# Patient Record
Sex: Male | Born: 1940
Health system: Southern US, Community
[De-identification: ages and names within clinical notes are randomized; demographics above are authoritative.]

## PROBLEM LIST (undated history)

## (undated) DIAGNOSIS — I469 Cardiac arrest, cause unspecified: Principal | ICD-10-CM

## (undated) DIAGNOSIS — M1990 Unspecified osteoarthritis, unspecified site: Secondary | ICD-10-CM

## (undated) DIAGNOSIS — Z95810 Presence of automatic (implantable) cardiac defibrillator: Secondary | ICD-10-CM

## (undated) DIAGNOSIS — Z9289 Personal history of other medical treatment: Secondary | ICD-10-CM

## (undated) DIAGNOSIS — I428 Other cardiomyopathies: Secondary | ICD-10-CM

## (undated) DIAGNOSIS — N400 Enlarged prostate without lower urinary tract symptoms: Secondary | ICD-10-CM

## (undated) DIAGNOSIS — E785 Hyperlipidemia, unspecified: Secondary | ICD-10-CM

## (undated) DIAGNOSIS — E039 Hypothyroidism, unspecified: Secondary | ICD-10-CM

## (undated) DIAGNOSIS — E119 Type 2 diabetes mellitus without complications: Secondary | ICD-10-CM

## (undated) DIAGNOSIS — I447 Left bundle-branch block, unspecified: Secondary | ICD-10-CM

## (undated) DIAGNOSIS — I10 Essential (primary) hypertension: Secondary | ICD-10-CM

## (undated) HISTORY — PX: KNEE ARTHROSCOPY: SUR90

## (undated) HISTORY — DX: Essential (primary) hypertension: I10

## (undated) HISTORY — PX: COLONOSCOPY W/ BIOPSIES: SHX1374

## (undated) HISTORY — DX: Left bundle-branch block, unspecified: I44.7

## (undated) HISTORY — DX: Cardiac arrest, cause unspecified: I46.9

## (undated) HISTORY — DX: Type 2 diabetes mellitus without complications: E11.9

## (undated) HISTORY — DX: Personal history of other medical treatment: Z92.89

## (undated) HISTORY — DX: Other cardiomyopathies: I42.8

## (undated) HISTORY — DX: Hyperlipidemia, unspecified: E78.5

## (undated) HISTORY — DX: Morbid (severe) obesity due to excess calories: E66.01

## (undated) HISTORY — DX: Hypothyroidism, unspecified: E03.9

---
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
## 1961-03-27 HISTORY — PX: MOUTH SURGERY: SHX715

## 1998-07-24 ENCOUNTER — Inpatient Hospital Stay (HOSPITAL_COMMUNITY): Admission: AD | Admit: 1998-07-24 | Discharge: 1998-07-25 | Payer: Self-pay | Admitting: Family Medicine

## 2000-02-08 ENCOUNTER — Encounter: Admission: RE | Admit: 2000-02-08 | Discharge: 2000-05-08 | Payer: Self-pay | Admitting: Family Medicine

## 2001-02-15 ENCOUNTER — Ambulatory Visit (HOSPITAL_COMMUNITY): Admission: RE | Admit: 2001-02-15 | Discharge: 2001-02-15 | Payer: Self-pay | Admitting: Orthopedic Surgery

## 2001-02-15 ENCOUNTER — Encounter: Payer: Self-pay | Admitting: Orthopedic Surgery

## 2002-05-22 ENCOUNTER — Encounter: Payer: Self-pay | Admitting: Urology

## 2002-05-22 ENCOUNTER — Ambulatory Visit (HOSPITAL_COMMUNITY): Admission: RE | Admit: 2002-05-22 | Discharge: 2002-05-22 | Payer: Self-pay | Admitting: Urology

## 2002-06-18 ENCOUNTER — Encounter: Payer: Self-pay | Admitting: Urology

## 2002-06-18 ENCOUNTER — Ambulatory Visit (HOSPITAL_COMMUNITY): Admission: RE | Admit: 2002-06-18 | Discharge: 2002-06-18 | Payer: Self-pay | Admitting: Urology

## 2003-06-11 ENCOUNTER — Emergency Department (HOSPITAL_COMMUNITY): Admission: EM | Admit: 2003-06-11 | Discharge: 2003-06-11 | Payer: Self-pay | Admitting: Emergency Medicine

## 2004-08-18 ENCOUNTER — Emergency Department (HOSPITAL_COMMUNITY): Admission: EM | Admit: 2004-08-18 | Discharge: 2004-08-18 | Payer: Self-pay | Admitting: Emergency Medicine

## 2004-11-09 ENCOUNTER — Encounter (INDEPENDENT_AMBULATORY_CARE_PROVIDER_SITE_OTHER): Payer: Self-pay | Admitting: Specialist

## 2004-11-09 ENCOUNTER — Ambulatory Visit (HOSPITAL_COMMUNITY): Admission: RE | Admit: 2004-11-09 | Discharge: 2004-11-09 | Payer: Self-pay | Admitting: Gastroenterology

## 2006-08-15 ENCOUNTER — Ambulatory Visit (HOSPITAL_COMMUNITY): Admission: RE | Admit: 2006-08-15 | Discharge: 2006-08-15 | Payer: Self-pay | Admitting: Family Medicine

## 2007-11-05 ENCOUNTER — Encounter: Admission: RE | Admit: 2007-11-05 | Discharge: 2007-11-05 | Payer: Self-pay | Admitting: Family Medicine

## 2008-09-24 ENCOUNTER — Ambulatory Visit: Payer: Self-pay | Admitting: Cardiovascular Disease

## 2008-09-24 ENCOUNTER — Observation Stay (HOSPITAL_COMMUNITY): Admission: EM | Admit: 2008-09-24 | Discharge: 2008-09-25 | Payer: Self-pay | Admitting: Emergency Medicine

## 2008-10-05 ENCOUNTER — Telehealth (INDEPENDENT_AMBULATORY_CARE_PROVIDER_SITE_OTHER): Payer: Self-pay | Admitting: *Deleted

## 2008-10-06 ENCOUNTER — Encounter: Payer: Self-pay | Admitting: Cardiology

## 2008-10-06 ENCOUNTER — Ambulatory Visit: Payer: Self-pay

## 2008-10-07 ENCOUNTER — Ambulatory Visit: Payer: Self-pay

## 2008-10-12 ENCOUNTER — Encounter: Payer: Self-pay | Admitting: Cardiovascular Disease

## 2010-07-03 LAB — CARDIAC PANEL(CRET KIN+CKTOT+MB+TROPI)
CK, MB: 1.3 ng/mL (ref 0.3–4.0)
Relative Index: 0.9 (ref 0.0–2.5)
Troponin I: 0.01 ng/mL (ref 0.00–0.06)
Troponin I: 0.02 ng/mL (ref 0.00–0.06)

## 2010-07-03 LAB — BASIC METABOLIC PANEL
BUN: 14 mg/dL (ref 6–23)
BUN: 16 mg/dL (ref 6–23)
CO2: 31 mEq/L (ref 19–32)
Calcium: 8.8 mg/dL (ref 8.4–10.5)
Chloride: 103 mEq/L (ref 96–112)
Creatinine, Ser: 1.06 mg/dL (ref 0.4–1.5)
GFR calc non Af Amer: >60 mL/min (ref >60)
Glucose, Bld: 195 mg/dL — ABNORMAL HIGH (ref 70–99)
Potassium: 3.4 mEq/L — ABNORMAL LOW (ref 3.5–5.1)
Potassium: 3.6 mEq/L (ref 3.5–5.1)
Sodium: 141 mEq/L (ref 135–145)

## 2010-07-03 LAB — POCT CARDIAC MARKERS
CKMB, poc: <1 ng/mL — ABNORMAL LOW (ref 1.0–8.0)
Myoglobin, poc: 157 ng/mL (ref 12–200)
Myoglobin, poc: 199 ng/mL (ref 12–200)

## 2010-07-03 LAB — CBC
HCT: 32.6 % — ABNORMAL LOW (ref 39.0–52.0)
Hemoglobin: 11.4 g/dL — ABNORMAL LOW (ref 13.0–17.0)
Hemoglobin: 12.6 g/dL — ABNORMAL LOW (ref 13.0–17.0)
MCHC: 35 g/dL (ref 30.0–36.0)
Platelets: 155 10*3/uL (ref 150–400)
RBC: 4.02 MIL/uL — ABNORMAL LOW (ref 4.22–5.81)
RDW: 14.3 % (ref 11.5–15.5)
RDW: 14.3 % (ref 11.5–15.5)

## 2010-07-03 LAB — POCT I-STAT, CHEM 8
BUN: 22 mg/dL (ref 6–23)
Calcium, Ion: 1.12 mmol/L (ref 1.12–1.32)
HCT: 37 % — ABNORMAL LOW (ref 39.0–52.0)
Hemoglobin: 12.6 g/dL — ABNORMAL LOW (ref 13.0–17.0)
Sodium: 143 mEq/L (ref 135–145)
TCO2: 29 mmol/L (ref 0–100)

## 2010-07-03 LAB — GLUCOSE, CAPILLARY
Glucose-Capillary: 139 mg/dL — ABNORMAL HIGH (ref 70–99)
Glucose-Capillary: 72 mg/dL (ref 70–99)
Glucose-Capillary: 99 mg/dL (ref 70–99)

## 2010-07-03 LAB — DIFFERENTIAL
Basophils Absolute: 0 10*3/uL (ref 0.0–0.1)
Basophils Relative: 1 % (ref 0–1)
Lymphocytes Relative: 30 % (ref 12–46)
Monocytes Absolute: 0.5 10*3/uL (ref 0.1–1.0)
Neutro Abs: 2.8 10*3/uL (ref 1.7–7.7)
Neutrophils Relative %: 56 % (ref 43–77)

## 2010-07-03 LAB — PROTIME-INR: INR: 1 (ref 0.00–1.49)

## 2010-07-03 LAB — BRAIN NATRIURETIC PEPTIDE: Pro B Natriuretic peptide (BNP): <30 pg/mL (ref 0.0–100.0)

## 2010-07-03 LAB — CK TOTAL AND CKMB (NOT AT ARMC)
CK, MB: 1.6 ng/mL (ref 0.3–4.0)
Relative Index: 0.9 (ref 0.0–2.5)

## 2010-07-03 LAB — APTT: aPTT: 27 seconds (ref 24–37)

## 2010-07-03 LAB — TROPONIN I: Troponin I: 0.01 ng/mL (ref 0.00–0.06)

## 2010-08-09 NOTE — H&P (Signed)
NAME:  Rickey Rice, Rickey Rice NO.:  [id number]   MEDICAL RECORD NO.:  [id number]          PATIENT TYPE:  OBV   LOCATION:  1827                         FACILITY:  [facility]   PHYSICIAN:  Verne Carrow, MDDATE OF BIRTH:  08/29/1940   DATE OF ADMISSION:  09/24/2008  DATE OF DISCHARGE:                              HISTORY & PHYSICAL   PRIMARY CARE PHYSICIAN:  Renaye Rakers, M.D.   HISTORY OF PRESENT ILLNESS:  The patient is a 70 year old obese African  American male with a history of hypertension and diabetes who presents  to the emergency department after experiencing chest pain at about 10:30  a.m. this morning.  The patient describes the pain as sharp, located in  the left chest and associated with left hand numbness.  The patient  reports the episode lasted about one minute and resolved spontaneously.  The patient was sitting at the computer at the time of this occurrence.  The patient had a recurrence of this pain briefly on the way to the  emergency department, both episodes lasted less than a minute.  They  were not associated with any nausea or diaphoresis.  Both episodes  resolved spontaneously.  At that onset of the first episode, the patient  took three baby aspirin at home.  He has been placed on NitroPaste and  glycerin patch urine emergency department, although he has experienced  no chest pain.  The patient reports that he had a negative stress test  about 15 years ago performed by Dr. Elsie Lincoln.   ALLERGIES:  BEE STINGS AND ACTOS.   MEDICINES.:  1. Amlodipine 5 mg daily.  2. Synthroid 75 mcg daily.  3. Metformin 500 mg daily.  4. Crestor 10 mg p.o. daily.  5. Tekturna HCT 300/25 p.o. daily.  6. Glimepiride 8 mg p.o. daily.  7. Onglyza 5 mg p.o. daily.  8. Celebrex p.r.n.   PAST MEDICAL HISTORY:  1. Hypertension.  2. Diabetes on oral medications last A1c per the patient was 8.43      months ago.  3. Hyperlipidemia.  4. Hypothyroidism.    SURGERIES:  1. Broken jaw 1973.  2. Knee arthroscopy in 2008.  3. Negative stress test 15 years ago.   FAMILY HISTORY:  1. A nephew who needs a heart transplant.  2. Sister with a stroke at age 32.  3. Diabetes in mother and father.  4. Cancer of the pancreas in mother.  5. Cancer of the breast in sister.  6. Cancer of the lung and throat in uncles who were smokers.  7. End-stage renal disease in a brother of unknown etiology diagnosed      in his 30s, still living.   SOCIAL HISTORY:  The patient lives with his wife Chinita Greenland, her  number is [phone].  The patient has never smoked.  He denies any drug  use.  Denies any alcohol use.  The patient is retired from Programme researcher, broadcasting/film/video.  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   REVIEW OF SYSTEMS:  Negative for any headache, vision changes, cough,  orthopnea, PND, dyspnea on exertion, nausea, vomiting, diarrhea,  melena,  hematuria.  The patient does endorse chronic bilateral lower extremity  pitting edema.   PHYSICAL EXAMINATION:  VITALS:  Temperature 98.3, blood pressure 145/85,  heart rate 69, respiratory rate 21, O2 saturation 98% on room air.  GENERAL:  The patient is in no acute distress.  He is obese.  He is  pleasant.  HEENT:  Extraocular muscles are intact.  Sclerae clear.  Moist mucous  membranes.  NECK:  No carotid bruits bilaterally.  No JVD.  CARDIOVASCULAR:  Regular rate and rhythm.  Normal precordium.  No  murmurs are auscultated.  LUNGS:  Work of breathing is unlabored.  Clear to auscultation  bilaterally.  No wheezes, rales or rhonchi.  ABDOMEN:  Positive bowel sounds, soft, nontender, nondistended.  EXTREMITIES:  Dorsalis pedis and radial pulses 2+.  There is moderate  bilateral lower extremity pitting edema.  There is no calf tenderness.   WORKUP SO FAR:  1. Chest x-ray shows elevated right hemidiaphragm that is stable from      previous.  2. Point of care cardiac enzymes are negative.  3. EKG shows  normal sinus rhythm.  No ST or T-wave abnormalities.      Intervals are normal.  4. White blood cell count 4.9, hemoglobin 12.6, platelets 194.  I-stat      chemistry shows sodium 143, potassium 4.2, chloride 104, CO2 29,      BUN 22, creatinine 1.2, glucose 83, PTT is 27.  PT 13.2, INR 1.0.   ASSESSMENT/PLAN:  This 70 year old obese gentleman with diabetes,  hypertension who comes to the emergency department after experiencing  brief atypical chest pain.  1. Chest pain.  Currently resolved.  EKG is unremarkable.  Point care      cardiac enzymes are negative times one set.  Given the patient's      history of diabetes and hypertension, plan to rule out with cardiac      enzymes.  The patient can also use further risk stratification with      stress test or other functional study.  We will continue the      patient on aspirin, start low-dose beta blocker for cardiac      protection, check fasting lipid panel.  Continue statin in the      hospital.  Will recheck EKG in the hospital and cycle cardiac      enzymes q.8 h x3.  2. Hypertension.  Blood pressure is slightly elevated in the emergency      department, heart rate 69.  We will continue his medications from      home and add beta-blocker.  3. Diabetes.  Continue home medications sliding scale insulin for      coverage of hyperglycemia.  The patient reports his last A1c was      approximately 8.4 about 3 months ago.  4. FENGI.  Carbohydrate modified diet, normal saline 75 mL an hour.      Check BNP level, although no edema is shown on chest x-ray.  5. Prophylaxis.  Lovenox for DVT prophylaxis.   DISPOSITION:  Cycle cardiac enzymes.  Consider functional study for  further risk stratification.  Assuming workup is negative, anticipate  early discharge for this patient.      Myrtie Soman, MD  Electronically Signed      Verne Carrow, MD  Electronically Signed    TE/MEDQ  D:  09/24/2008  T:  09/24/2008  Job:   [id number]

## 2010-08-09 NOTE — Discharge Summary (Signed)
NAME:  DEVLYN, RETTER NO.:  [id number]   MEDICAL RECORD NO.:  [id number]          PATIENT TYPE:  OBV   LOCATION:  3735                         FACILITY:  [facility]   PHYSICIAN:  Rollene Rotunda, MD, FACCDATE OF BIRTH:  [birth year]   DATE OF ADMISSION:  09/24/2008  DATE OF DISCHARGE:  09/25/2008                               DISCHARGE SUMMARY   PRIMARY CARDIOLOGIST:  Verne Carrow, MD   PRIMARY CARE Denea Cheaney:  Renaye Rakers, MD   DISCHARGE DIAGNOSIS:  Chest pain.   SECONDARY DIAGNOSES:  1. Hypertension.  2. Hyperlipidemia.  3. Type 2 diabetes mellitus.  4. Obesity.  5. Hypothyroidism.   ALLERGIES:  BEE STINGS and ACTOS.   PROCEDURES:  None.   HISTORY OF PRESENT ILLNESS:  A 70 year old [place] American male with  the above problems.  He was in his usual state of health until a morning  of July 1.  When he had a brief less than 1 minute episode of sharp left  sided chest discomfort associated with left hand numbness.  Symptoms  resolved spontaneously.  He had one recurrent episode again lasting less  than 1 minute, while he was driving to the ED.  Once in the ER, ECG  showed no acute changes and enzymes were negative.  He was admitted for  observation and rule out hospital course.  The patient ruled out for MI.  He had no recurrent chest discomfort.  Symptoms were felt to be atypical  and we will discharge him home today in good condition.  He is currently  arranged for an outpatient 2-day exercise Myoview on July 13.  Our  office is going to work to try and move that appointment up to some time  early next week.   DISCHARGE LABORATORY DATA:  Hemoglobin 11.4, hematocrit 32.6, WBC 5.4,  and platelets 155.  Sodium 141, potassium 3.1, chloride 105, CO2 30, BUN  14, creatinine 1.11, glucose 195, and calcium 8.8.  Cardiac markers  negative x3.  BNP less than 30.   DISPOSITION:  The patient will be discharged home today in good  condition.   FOLLOWUP PLANS  AND APPOINTMENTS:  He has an exercise Myoview scheduled  in our office for July 13 at 7:30 a.m.  This is tentative date as we  hope to move this appointment up.  He is asked to follow up with Dr.  Parke Simmers in the next 1-2 weeks.   DISCHARGE MEDICATIONS:  1. Aspirin 81 mg daily.  2. Amlodipine 5 mg daily.  3. Synthroid 75 mcg daily.  4. Metformin 500 mg daily.  5. Crestor 10 mg daily.  6. Tekturna HCT 300/25 mg daily.  7. Glimepiride 8 mg daily.  8. Onglyza 5 mg daily.  9. Celebrex 100 mg daily p.r.n.  10.Nitroglycerin 0.4 mg sublingual p.r.n. chest pain.   OUTSTANDING LABORATORY STUDIES:  None.  Exercise Myoview as outlined  above.   DURATION OF DISCHARGE ENCOUNTER:  50 minutes including physician time.      Nicolasa Ducking, ANP      Rollene Rotunda, MD, [facility]  Electronically Signed    CB/MEDQ  D:  09/25/2008  T:  09/25/2008  Job:  [id number]   cc:   Renaye Rakers, M.D.

## 2010-08-12 NOTE — Op Note (Signed)
NAME:  Rickey Rice, Rickey Rice              ACCOUNT NO.:  [id number]   MEDICAL RECORD NO.:  [id number]          PATIENT TYPE:  AMB   LOCATION:  ENDO                         FACILITY:  [facility]   PHYSICIAN:  Anselmo Rod, M.D.  DATE OF BIRTH:  11/11/1940   DATE OF PROCEDURE:  11/09/2004  DATE OF DISCHARGE:                                 OPERATIVE REPORT   PROCEDURE PERFORMED:  Colonoscopy with snare polypectomy x1.   ENDOSCOPIST:  Anselmo Rod, M.D.   INSTRUMENT USED:  Olympus video colonoscope.   INDICATIONS FOR PROCEDURE:  A 70 year old [place] American male with history  of multiple medical problems, GERD, hypertension, arthritis, diabetes and  hypothyroidism, morbid obesity undergoing screening colonoscopy to rule out  colonic polyps, masses, etc.   PREPROCEDURE PREPARATION:  Informed consent was procured from the patient.  The patient fasted for 8 hours prior to the procedure and prepped with a  bottle of magnesium citrate and a gallon of GoLYTELY the night prior to the  procedure.  Risks and benefits of the procedure including a 10% miss rate of  cancer and polyps was discussed with the patient as well.   PREPROCEDURE PHYSICAL:  VITAL SIGNS:  Stable vital signs.  NECK:  Supple.  CHEST:  Clear to auscultation.  CARDIOVASCULAR:  S1 and S2 regular.  ABDOMEN:  Soft with normal bowel sounds.   DESCRIPTION OF PROCEDURE:  The patient was placed in left lateral decubitus  position, sedated with 60 mg of Demerol and 6 mg of Versed in slow  incremental doses.  Once the patient was adequately sedated and maintained  on low flow oxygen and continuous cardiac monitoring, the Olympus video  colonoscope was advanced from the rectum to the cecum with extreme  difficulty because of the patient's body habitus and long tortuous colon.  The patient's position was changed from the left lateral to the supine and  then the right lateral and ultimately the prone position.  Gentle abdominal  pressure was applied several times to reach the cecal base.  There was large  amount of residual stool in the right colon.  Multiple washings were done.  Small lesions could be missed.  A small sessile polyp was snared from 100  cm.  There was evidence of extensive sigmoid diverticulosis.  Small internal  hemorrhoids were seen on retroflexion.  The patient tolerated the procedure  well without complications.   IMPRESSION:  1.  Small nonbleeding internal hemorrhoids.  2.  Extensive sigmoid diverticulosis.  3.  Small sessile polyp snared from 100 cm.  4.  Large amount of residual stool in the right colon.  Multiple washings      done.   RECOMMENDATIONS:  1.  Await pathology results.  2.  Avoid nonsteroidals including aspirin for the next 10 days.  3.  Repeat colonoscopy depending on pathology results.  4.  Brochures on diverticulosis and a high fiber diet have been given to the      patient for his education.  5.  Outpatient follow-up as need arises in the future.      Anselmo Rod, M.D.  Electronically Signed     JNM/MEDQ  D:  11/09/2004  T:  11/09/2004  Job:  [id number]   cc:   Renaye Rakers, M.D.  [phone] [address], [address]  [place]  [place] [address]  Fax: [phone]

## 2012-04-22 ENCOUNTER — Other Ambulatory Visit (HOSPITAL_COMMUNITY): Payer: Self-pay | Admitting: Internal Medicine

## 2012-04-22 DIAGNOSIS — I10 Essential (primary) hypertension: Secondary | ICD-10-CM

## 2012-04-22 DIAGNOSIS — R0600 Dyspnea, unspecified: Secondary | ICD-10-CM

## 2012-04-22 DIAGNOSIS — E119 Type 2 diabetes mellitus without complications: Secondary | ICD-10-CM

## 2012-04-22 DIAGNOSIS — I447 Left bundle-branch block, unspecified: Secondary | ICD-10-CM

## 2012-04-27 DIAGNOSIS — Z9289 Personal history of other medical treatment: Secondary | ICD-10-CM

## 2012-04-27 HISTORY — DX: Personal history of other medical treatment: Z92.89

## 2012-05-01 ENCOUNTER — Ambulatory Visit (HOSPITAL_COMMUNITY)
Admission: RE | Admit: 2012-05-01 | Discharge: 2012-05-01 | Disposition: A | Discharge status: Home / Self Care | Payer: Medicare Other | Source: Ambulatory Visit | Attending: Internal Medicine | Admitting: Internal Medicine

## 2012-05-01 DIAGNOSIS — R0600 Dyspnea, unspecified: Secondary | ICD-10-CM

## 2012-05-01 DIAGNOSIS — R0989 Other specified symptoms and signs involving the circulatory and respiratory systems: Secondary | ICD-10-CM | POA: Insufficient documentation

## 2012-05-01 DIAGNOSIS — E119 Type 2 diabetes mellitus without complications: Secondary | ICD-10-CM

## 2012-05-01 DIAGNOSIS — R0609 Other forms of dyspnea: Secondary | ICD-10-CM | POA: Insufficient documentation

## 2012-05-01 DIAGNOSIS — I447 Left bundle-branch block, unspecified: Secondary | ICD-10-CM | POA: Insufficient documentation

## 2012-05-01 DIAGNOSIS — I10 Essential (primary) hypertension: Secondary | ICD-10-CM | POA: Insufficient documentation

## 2012-05-01 MED ORDER — TECHNETIUM TC 99M SESTAMIBI GENERIC - CARDIOLITE
30.0000 | Freq: Once | INTRAVENOUS | Status: AC | PRN
  Administered 2012-05-01: 30 via INTRAVENOUS

## 2012-05-01 MED ORDER — REGADENOSON 0.4 MG/5ML IV SOLN
0.4000 mg | Freq: Once | INTRAVENOUS | Status: AC
  Administered 2012-05-01: 0.4 mg via INTRAVENOUS

## 2012-05-01 NOTE — Procedures (Signed)
[facility] [facility] [address] [place] [address] [place] [address] [phone]  Cardiology Nuclear Med Study  Rickey Rice is a 72 y.o. male     MRN : [id number]     DOB: 07-Mar-1941  Procedure Date: 05/01/2012  Nuclear Med Background Indication for Stress Test:  Evaluation for Ischemia History:  no prior cardiac history Cardiac Risk Factors: Hypertension, LBBB, Lipids, NIDDM and Obesity  Symptoms:  DOE   Nuclear Pre-Procedure Caffeine/Decaff Intake:  7:00pm NPO After: 5:00am   IV Site: R Antecubital  IV 0.9% NS with Angio Cath:  22g  Chest Size (in):  2X IV Started by: Koren Shiver, CNMT  Height: 6' (1.829 m)  Cup Size: n/a  BMI:  Body mass index is 44.48 kg/(m^2). Weight:  328 lb (148.78 kg)   Tech Comments:  n/a    Nuclear Med Study 1 or 2 day study: 2 day  Stress Test Type:  Lexiscan  Order Authorizing Provider:  Zoila Shutter, MD   Resting Radionuclide: Technetium 47m Sestamibi  Resting Radionuclide Dose: 30.3 mCi   Stress Radionuclide:  Technetium 35m Sestamibi  Stress Radionuclide Dose: 30.9 mCi           Stress Protocol Rest HR: 72 Stress HR: 88  Rest BP: 142/98 Stress BP: 134/80  Exercise Time (min): n/a METS: n/a   Predicted Max HR: 149 bpm % Max HR: 59.06 bpm Rate Pressure Product: 46962   Dose of Adenosine (mg):  n/a Dose of Lexiscan: 0.4 mg  Dose of Atropine (mg): n/a Dose of Dobutamine: n/a mcg/kg/min (at max HR)  Stress Test Technologist: Esperanza Sheets, CCT Nuclear Technologist: Koren Shiver, CNMT   Rest Procedure:  Myocardial perfusion imaging was performed at rest 45 minutes following the intravenous administration of Technetium 78m Sestamibi. Stress Procedure:  The patient received IV Lexiscan 0.4 mg over 15-seconds.  Technetium 37m Sestamibi injected at 30-seconds.  There were no significant changes with Lexiscan.  Quantitative spect images were obtained after a 45 minute delay.  Transient Ischemic  Dilatation (Normal <1.22):  1.1 Lung/Heart Ratio (Normal <0.45):  0.29 QGS EDV:  128 ml QGS ESV:  65 ml LV Ejection Fraction: 49%  Signed by

## 2012-05-02 ENCOUNTER — Ambulatory Visit (HOSPITAL_COMMUNITY)
Admission: RE | Admit: 2012-05-02 | Discharge: 2012-05-02 | Disposition: A | Discharge status: Home / Self Care | Payer: Medicare Other | Source: Ambulatory Visit | Attending: Internal Medicine | Admitting: Internal Medicine

## 2012-05-02 DIAGNOSIS — E119 Type 2 diabetes mellitus without complications: Secondary | ICD-10-CM | POA: Insufficient documentation

## 2012-05-02 DIAGNOSIS — R0609 Other forms of dyspnea: Secondary | ICD-10-CM | POA: Insufficient documentation

## 2012-05-02 DIAGNOSIS — I10 Essential (primary) hypertension: Secondary | ICD-10-CM | POA: Insufficient documentation

## 2012-05-02 DIAGNOSIS — I447 Left bundle-branch block, unspecified: Secondary | ICD-10-CM | POA: Insufficient documentation

## 2012-05-02 DIAGNOSIS — R0989 Other specified symptoms and signs involving the circulatory and respiratory systems: Secondary | ICD-10-CM | POA: Insufficient documentation

## 2012-05-02 DIAGNOSIS — R0600 Dyspnea, unspecified: Secondary | ICD-10-CM

## 2012-05-02 MED ORDER — TECHNETIUM TC 99M SESTAMIBI GENERIC - CARDIOLITE
30.0000 | Freq: Once | INTRAVENOUS | Status: AC | PRN
  Administered 2012-05-02: 30 via INTRAVENOUS

## 2012-05-02 NOTE — Procedures (Addendum)
[facility] [address]  [address] [place] [address]  [place] [address]  [phone]  Cardiology Nuclear Med Study  Rickey Rice is a 72 y.o. male MRN : [id number] DOB: 04-May-1940  Procedure Date: 05/01/2012  Nuclear Med Background  Indication for Stress Test: Evaluation for Ischemia  History: no prior cardiac history  Cardiac Risk Factors: Hypertension, LBBB, Lipids, NIDDM and Obesity  Symptoms: DOE  Nuclear Pre-Procedure  Caffeine/Decaff Intake: 7:00pm  NPO After: 5:00am   IV Site: R Antecubital  IV 0.9% NS with Angio Cath: 22g   Chest Size (in): 2X  IV Started by: Koren Shiver, CNMT   Height: 6' (1.829 m)  Cup Size: n/a   BMI: Body mass index is 44.48 kg/(m^2).  Weight: 328 lb (148.78 kg)    Tech Comments: n/a   Nuclear Med Study  1 or 2 day study: 2 day  Stress Test Type: Lexiscan   Order Authorizing Provider: Zoila Shutter, MD    Resting Radionuclide: Technetium 3m Sestamibi  Resting Radionuclide Dose: 30.3 mCi   Stress Radionuclide: Technetium 46m Sestamibi  Stress Radionuclide Dose: 30.9 mCi   Stress Protocol  Rest HR: 72  Stress HR: 88   Rest BP: 142/98  Stress BP: 134/80   Exercise Time (min): n/a  METS: n/a   Predicted Max HR: 149 bpm  % Max HR: 59.06 bpm  Rate Pressure Product: 29528  Dose of Adenosine (mg): n/a  Dose of Lexiscan: 0.4 mg   Dose of Atropine (mg): n/a  Dose of Dobutamine: n/a mcg/kg/min (at max HR)   Stress Test Technologist: Esperanza Sheets, CCT  Nuclear Technologist: Koren Shiver, CNMT   Rest Procedure: Myocardial perfusion imaging was performed at rest 45 minutes following the intravenous administration of Technetium 20m Sestamibi.  Stress Procedure: The patient received IV Lexiscan 0.4 mg over 15-seconds. Technetium 81m Sestamibi injected at 30-seconds. There were no significant changes with Lexiscan. Quantitative spect images were obtained after a 45 minute delay.  Transient Ischemic Dilatation (Normal  <1.22): 1.1  Lung/Heart Ratio (Normal <0.45): 0.29  QGS EDV: 128 ml  QGS ESV: 65 ml  LV Ejection Fraction: 49%   Rest ECG: NSR-LBBB Stress ECG: no change  QPS Raw Data Images:  Normal; no motion artifact; normal heart/lung ratio. Stress Images:  There is decreased uptake in the inferior wall and the apex. Rest Images:  There is decreased uptake in the inferior wall and the apex. Subtraction (SDS):   LV Wall Motion:  Mildly depressed LV function. Mild inferior and apical hypokinesis.  Impression Exercise Capacity: Lexiscan study, no exercise BP Response:  Normal blood pressure response. Clinical Symptoms:  none ECG Impression: Uninterpretable due to LBBB Comparison with Prior Nuclear Study: No previous study is available for comparison. Overall Impression:  Low risk stress nuclear study.There is evidence of inferior and apical scar, but no ischemia.     Thurmon Fair, MD  05/02/2012 1:27 PM

## 2013-01-06 ENCOUNTER — Encounter: Payer: Self-pay | Admitting: *Deleted

## 2013-01-07 ENCOUNTER — Ambulatory Visit (INDEPENDENT_AMBULATORY_CARE_PROVIDER_SITE_OTHER): Payer: Medicare Other | Admitting: Internal Medicine

## 2013-01-07 ENCOUNTER — Encounter: Payer: Self-pay | Admitting: Internal Medicine

## 2013-01-07 VITALS — BP 128/82 | HR 73 | Ht 72.0 in | Wt 333.3 lb

## 2013-01-07 DIAGNOSIS — E119 Type 2 diabetes mellitus without complications: Secondary | ICD-10-CM | POA: Insufficient documentation

## 2013-01-07 DIAGNOSIS — E039 Hypothyroidism, unspecified: Secondary | ICD-10-CM | POA: Insufficient documentation

## 2013-01-07 DIAGNOSIS — I447 Left bundle-branch block, unspecified: Secondary | ICD-10-CM | POA: Insufficient documentation

## 2013-01-07 DIAGNOSIS — E785 Hyperlipidemia, unspecified: Secondary | ICD-10-CM | POA: Insufficient documentation

## 2013-01-07 DIAGNOSIS — Z01810 Encounter for preprocedural cardiovascular examination: Secondary | ICD-10-CM | POA: Insufficient documentation

## 2013-01-07 DIAGNOSIS — I10 Essential (primary) hypertension: Secondary | ICD-10-CM | POA: Insufficient documentation

## 2013-01-07 NOTE — Progress Notes (Signed)
OFFICE NOTE  Chief Complaint:  Pre-operative evaluation  Primary Care Physician: Geraldo Pitter, MD  HPI:  Rickey Rice  is a 72 year old gentleman with a history of diabetes, hypertension, hypothyroidism, and morbid obesity. In 2010 he had complaints of chest pain and underwent stress testing with [facility] which was a 2-day nuclear stress test and was apparently negative. Recently he underwent a colonoscopy and a preoperative EKG was abnormal. I unfortunately do not have that EKG to review, but I did review the EKG from your office on April 09, 2012 which showed a borderline intraventricular conduction delay, a sinus rhythm at 66, and poor R-wave progression. Today in the office he has an abnormal EKG demonstrating a left bundle branch block with a QRS duration of 168 msec, heart rate of 78 in sinus. His only symptoms are shortness of breath with exertion. He underwent evaluation of his left bundle branch block in February 2014. This is a 2 day nuclear stress test which was negative for ischemia. There was a small inferior and apical defect which could represent scar versus artifact. EF was mildly reduced at 49%.  He is now contemplating knee replacement surgery. He continues to be asymptomatic denies any chest pain or worsening shortness of breath with exertion.  PMHx:  Past Medical History  Diagnosis Date  . Diabetes mellitus without complication     type 2  . Hypertension   . Hypothyroidism   . Morbid obesity   . Dyslipidemia   . LBBB (left bundle branch block)   . History of nuclear stress test 04/2012    lexiscan - 2 day protocol; low risk study, evidence of inferrior & apical scar but no ischemia     Past Surgical History  Procedure Laterality Date  . Mouth surgery  1963  . Finger surgery  1954    FAMHx:  Family History  Problem Relation Age of Onset  . Cancer Mother   . Kidney disease Brother   . Hypertension Sister     SOCHx:   reports that he has  never smoked. He has never used smokeless tobacco. He reports that he does not drink alcohol or use illicit drugs.  ALLERGIES:  Allergies  Allergen Reactions  . Norvasc [Amlodipine Besylate]   . Pioglitazone     REACTION: Reaction not known    ROS: A comprehensive review of systems was negative except for: Constitutional: positive for weight gain Respiratory: positive for dyspnea on exertion Musculoskeletal: positive for stiff joints and knee pain  HOME MEDS: Current Outpatient Prescriptions  Medication Sig Dispense Refill  . [place]-Hydrochlorothiazide (TEKTURNA HCT) 300-25 MG TABS Take 1 tablet by mouth daily.      Marland Kitchen amLODipine (NORVASC) 5 MG tablet Take 5 mg by mouth daily.      . celecoxib (CELEBREX) 200 MG capsule Take 200 mg by mouth daily.      Marland Kitchen glimepiride (AMARYL) 4 MG tablet Take 4 mg by mouth daily before breakfast.      . HYDROcodone-acetaminophen (NORCO/VICODIN) 5-325 MG per tablet as needed.      Marland Kitchen levothyroxine (SYNTHROID, LEVOTHROID) 75 MCG tablet Take 75 mcg by mouth daily before breakfast.      . metFORMIN (GLUCOPHAGE) 1000 MG tablet Take 1,000 mg by mouth daily with breakfast.      . rosuvastatin (CRESTOR) 10 MG tablet Take 10 mg by mouth daily.      . saxagliptin HCl (ONGLYZA) 5 MG TABS tablet Take 5 mg by mouth daily.  No current facility-administered medications for this visit.    LABS/IMAGING: No results found for this or any previous visit (from the past 48 hour(s)). No results found.  VITALS: BP 128/82  Pulse 73  Ht 6' (1.829 m)  Wt 333 lb 4.8 oz (151.184 kg)  BMI 45.19 kg/m2  EXAM: General appearance: alert, no distress and morbidly obese Lungs: clear to auscultation bilaterally Heart: regular rate and rhythm, S1, S2 normal, no murmur, click, rub or gallop Extremities: extremities normal, atraumatic, no cyanosis or edema Pulses: 2+ and symmetric Skin: Skin color, texture, turgor normal. No rashes or lesions  EKG: Normal sinus  rhythm, left bundle branch block pattern  ASSESSMENT: 1. Low to intermediate risk for upcoming surgery 2. Negative nuclear stress test for ischemia, fixed inferoapical defect suggesting possible scar versus artifact, EF 49% 3.   Diabetes type 2 on oral medications 4.   Hypertension-well controlled 5.   Dyslipidemia statin  PLAN: 1.   Mr. Musgrave had a low risk stress test in February 2014. Although he has significant risks for coronary disease, we have not been able to demonstrate that conclusively. There was a small inferoapical defect which I suggested possibly artifact. EF was 49% however given his left bundle branch block, gaiting abnormalities are common. Either way I believe this is a low risk study and would classify him as low to intermediate risk for surgery. He'll the medication he's not currently taking it maybe beneficial is a beta blocker. However he does have a significant left bundle branch block and he may not tolerate beta blockade.  If desired, consider using low-dose short-acting metoprolol tartrate and titrate heart rate down around 60 around the time of surgery, starting several days preoperatively.  I'll plan to see him in followup in about 6 months but would be happy to be available as needed around the time of surgery if there are any cardiac complications.  Thanks for continuing to involve me in his care.  Chrystie Nose, MD, [facility] Attending Cardiologist [facility]  Melana Hingle C 01/07/2013, 2:12 PM

## 2013-01-07 NOTE — Patient Instructions (Signed)
Follow up in 6 months 

## 2013-01-08 ENCOUNTER — Encounter: Payer: Self-pay | Admitting: Internal Medicine

## 2013-01-15 ENCOUNTER — Other Ambulatory Visit: Payer: Self-pay | Admitting: Orthopedic Surgery

## 2013-01-28 ENCOUNTER — Encounter (HOSPITAL_COMMUNITY): Payer: Self-pay | Admitting: Pharmacy Technician

## 2013-01-28 NOTE — Pre-Procedure Instructions (Signed)
Rickey Rice  01/28/2013   Your procedure is scheduled on:  Wednesday, November 12th   Report to [facility] Short Stay Main Entrance "A" at 10:45 AM.   Call this number if you have problems the morning of surgery: [phone]   Remember:   Do not eat food or drink liquids after midnight Tuesday.   Take these medicines the morning of surgery with A SIP OF WATER: Amlodipine, Hydrocodone, Levothyroxine   Do not wear jewelry-no rings or watches.  Do not wear lotions, powders, or colognes. You may wear deodorant.  Men may shave face and neck.   Do not bring valuables to the hospital.  [facility] is not responsible for any belongings or valuables.               Contacts, dentures or bridgework may not be worn into surgery.  Leave suitcase in the car. After surgery it may be brought to your room.   For patients admitted to the hospital, discharge time is determined by your treatment team.               Name and phone number of your driver:    Special Instructions: Shower using CHG 2 nights before surgery and the night before surgery.  If you shower the day of surgery use CHG.  Use special wash - you have one bottle of CHG for all showers.  You should use approximately 1/3 of the bottle for each shower.   Please read over the following fact sheets that you were given: Pain Booklet, Coughing and Deep Breathing, Blood Transfusion Information, MRSA Information and Surgical Site Infection Prevention

## 2013-01-29 ENCOUNTER — Encounter (HOSPITAL_COMMUNITY)
Admission: RE | Admit: 2013-01-29 | Discharge: 2013-01-29 | Disposition: A | Discharge status: Home / Self Care | Payer: Medicare Other | Source: Ambulatory Visit | Attending: Orthopedic Surgery | Admitting: Orthopedic Surgery

## 2013-01-29 ENCOUNTER — Encounter (HOSPITAL_COMMUNITY): Payer: Self-pay

## 2013-01-29 DIAGNOSIS — Z01812 Encounter for preprocedural laboratory examination: Secondary | ICD-10-CM | POA: Insufficient documentation

## 2013-01-29 DIAGNOSIS — Z01818 Encounter for other preprocedural examination: Secondary | ICD-10-CM | POA: Insufficient documentation

## 2013-01-29 HISTORY — DX: Unspecified osteoarthritis, unspecified site: M19.90

## 2013-01-29 LAB — URINE MICROSCOPIC-ADD ON

## 2013-01-29 LAB — URINALYSIS, ROUTINE W REFLEX MICROSCOPIC
Bilirubin Urine: NEGATIVE
Glucose, UA: NEGATIVE mg/dL
Hgb urine dipstick: NEGATIVE
Ketones, ur: NEGATIVE mg/dL
Protein, ur: 30 mg/dL — AB
Specific Gravity, Urine: 1.02 (ref 1.005–1.030)

## 2013-01-29 LAB — BASIC METABOLIC PANEL
BUN: 17 mg/dL (ref 6–23)
Chloride: 105 mEq/L (ref 96–112)
GFR calc Af Amer: 75 mL/min — ABNORMAL LOW (ref >90)
GFR calc non Af Amer: 65 mL/min — ABNORMAL LOW (ref >90)
Potassium: 4 mEq/L (ref 3.5–5.1)
Sodium: 143 mEq/L (ref 135–145)

## 2013-01-29 LAB — CBC WITH DIFFERENTIAL/PLATELET
Basophils Absolute: 0 10*3/uL (ref 0.0–0.1)
Basophils Relative: 0 % (ref 0–1)
Hemoglobin: 12.8 g/dL — ABNORMAL LOW (ref 13.0–17.0)
MCH: 31.7 pg (ref 26.0–34.0)
MCHC: 33.9 g/dL (ref 30.0–36.0)
Monocytes Absolute: 0.6 10*3/uL (ref 0.1–1.0)
Monocytes Relative: 11 % (ref 3–12)
Neutro Abs: 2.7 10*3/uL (ref 1.7–7.7)
Neutrophils Relative %: 50 % (ref 43–77)
Platelets: 186 10*3/uL (ref 150–400)
WBC: 5.4 10*3/uL (ref 4.0–10.5)

## 2013-01-29 LAB — APTT: aPTT: 30 seconds (ref 24–37)

## 2013-01-29 LAB — PROTIME-INR
INR: 0.93 (ref 0.00–1.49)
Prothrombin Time: 12.3 seconds (ref 11.6–15.2)

## 2013-01-29 LAB — ABO/RH: ABO/RH(D): O POS

## 2013-01-29 LAB — TYPE AND SCREEN: ABO/RH(D): O POS

## 2013-01-30 NOTE — Progress Notes (Signed)
Anesthesia Chart Review:  Patient is a 72 year old male scheduled for right TKA on 02/05/13 by Dr. Turner Daniels.  History includes morbid obesity, DM2, HTN, hypothyroidism, left BBB, dyslipidemia, arthritis, non-smoker.  Cardiologist is Dr. Rennis Golden who cleared him with low to intermediate risk.  EKG on 01/07/13 showed NSR, left BBB, possible lateral infarct.  Nuclear stress test on 05/02/12 showed: Low risk stress nuclear study.There is evidence of inferior and apical scar but no ischemia. EF 49%.  CXR on 01/29/13 showed low lung volumes, no acute cardiopulmonary process.  Preoperative labs noted.  Anticipate that he can proceed as planned.  Velna Ochs [facility] Short Stay Center/Anesthesiology Phone [phone] 01/30/2013 4:31 PM

## 2013-02-04 MED ORDER — DEXTROSE 5 % IV SOLN
3.0000 g | INTRAVENOUS | Status: AC
  Administered 2013-02-05: 3 g via INTRAVENOUS
  Filled 2013-02-04: qty 3000

## 2013-02-04 NOTE — H&P (Signed)
TOTAL KNEE ADMISSION H&P  Patient is being admitted for right total knee arthroplasty.  Subjective:  Chief Complaint:right knee pain.  HPI: Rickey Rice, 72 y.o. male, has a history of pain and functional disability in the right knee due to arthritis and has failed non-surgical conservative treatments for greater than 12 weeks to includeNSAID's and/or analgesics, corticosteriod injections, viscosupplementation injections and activity modification.  Onset of symptoms was gradual, starting many years ago with gradually worsening course since that time. The patient noted no past surgery on the right knee(s).  Patient currently rates pain in the right knee(s) at 10 out of 10 with activity. Patient has worsening of pain with activity and weight bearing, pain that interferes with activities of daily living and pain with passive range of motion.  Patient has evidence of joint space narrowing by imaging studies. There is no active infection.  Patient Active Problem List   Diagnosis Date Noted  . LBBB (left bundle branch block) 01/07/2013  . Preoperative cardiovascular examination 01/07/2013  . DM2 (diabetes mellitus, type 2) 01/07/2013  . Hypothyroidism 01/07/2013  . Morbid obesity 01/07/2013  . HTN (hypertension) 01/07/2013  . Dyslipidemia 01/07/2013   Past Medical History  Diagnosis Date  . Diabetes mellitus without complication     type 2  . Hypertension   . Hypothyroidism   . Morbid obesity   . Dyslipidemia   . LBBB (left bundle branch block)   . History of nuclear stress test 04/2012    lexiscan - 2 day protocol; low risk study, evidence of inferrior & apical scar but no ischemia   . Arthritis     Past Surgical History  Procedure Laterality Date  . Mouth surgery  1963  . Finger surgery  1954  . Colonoscopy w/ biopsies    . Knee arthroscopy Left     No prescriptions prior to admission   Allergies  Allergen Reactions  . Pioglitazone     Lips swelling    History   Substance Use Topics  . Smoking status: Never Smoker   . Smokeless tobacco: Never Used  . Alcohol Use: No    Family History  Problem Relation Age of Onset  . Cancer Mother   . Kidney disease Brother   . Hypertension Sister      Review of Systems  Constitutional: Negative.   HENT: Negative.   Eyes: Negative.   Respiratory: Negative.   Cardiovascular: Negative.   Gastrointestinal: Negative.   Genitourinary: Negative.   Musculoskeletal: Positive for joint pain.  Skin: Negative.   Neurological: Negative.   Endo/Heme/Allergies: Negative.   Psychiatric/Behavioral: Negative.     Objective:  Physical Exam  Constitutional: He is oriented to person, place, and time. He appears well-developed and well-nourished.  HENT:  Head: Normocephalic and atraumatic.  Eyes: Pupils are equal, round, and reactive to light.  Neck: Normal range of motion.  Cardiovascular: Intact distal pulses.   Respiratory: Effort normal.  Musculoskeletal:  Patient's BMI is 40, range of motion of both knees is 5 - 120 then limited by adipose tissue.  He is tender along the medial joint line.  Collateral ligaments are stable.  His skin is intact.  He is neurovascularly intact.  He has no history of stroke, heart disease.  Neurological: He is alert and oriented to person, place, and time.  Skin: Skin is warm and dry.  Psychiatric: He has a normal mood and affect. His behavior is normal. Judgment and thought content normal.    Vital signs in  last 24 hours:    Labs:   Estimated body mass index is 45.19 kg/(m^2) as calculated from the following:   Height as of 01/07/13: 6' (1.829 m).   Weight as of 01/07/13: 151.184 kg (333 lb 4.8 oz).   Imaging Review His x-rays show varus deformities with bone-on-bone arthritis erosion of the femur into the tibia.  1-2 mm.    Assessment/Plan:  End stage arthritis, right knee   The patient history, physical examination, clinical judgment of the provider and imaging  studies are consistent with end stage degenerative joint disease of the right knee(s) and total knee arthroplasty is deemed medically necessary. The treatment options including medical management, injection therapy arthroscopy and arthroplasty were discussed at length. The risks and benefits of total knee arthroplasty were presented and reviewed. The risks due to aseptic loosening, infection, stiffness, patella tracking problems, thromboembolic complications and other imponderables were discussed. The patient acknowledged the explanation, agreed to proceed with the plan and consent was signed. Patient is being admitted for inpatient treatment for surgery, pain control, PT, OT, prophylactic antibiotics, VTE prophylaxis, progressive ambulation and ADL's and discharge planning. The patient is planning to be discharged home with home health services

## 2013-02-05 ENCOUNTER — Inpatient Hospital Stay (HOSPITAL_COMMUNITY)
Admission: RE | Admit: 2013-02-05 | Discharge: 2013-02-10 | DRG: 470 | Disposition: A | Discharge status: Skilled Nursing Facility | Payer: Medicare Other | Source: Ambulatory Visit | Attending: Orthopedic Surgery | Admitting: Orthopedic Surgery

## 2013-02-05 ENCOUNTER — Encounter (HOSPITAL_COMMUNITY)
Admission: RE | Disposition: A | Discharge status: Skilled Nursing Facility | Payer: Self-pay | Source: Ambulatory Visit | Attending: Orthopedic Surgery

## 2013-02-05 ENCOUNTER — Inpatient Hospital Stay (HOSPITAL_COMMUNITY): Payer: Medicare Other | Admitting: Certified Registered Nurse Anesthetist

## 2013-02-05 ENCOUNTER — Encounter (HOSPITAL_COMMUNITY): Payer: Medicare Other | Admitting: Vascular Surgery

## 2013-02-05 ENCOUNTER — Encounter (HOSPITAL_COMMUNITY): Payer: Self-pay | Admitting: *Deleted

## 2013-02-05 DIAGNOSIS — Z6841 Body Mass Index (BMI) 40.0 and over, adult: Secondary | ICD-10-CM

## 2013-02-05 DIAGNOSIS — E119 Type 2 diabetes mellitus without complications: Secondary | ICD-10-CM | POA: Diagnosis present

## 2013-02-05 DIAGNOSIS — Z7982 Long term (current) use of aspirin: Secondary | ICD-10-CM

## 2013-02-05 DIAGNOSIS — R509 Fever, unspecified: Secondary | ICD-10-CM | POA: Diagnosis not present

## 2013-02-05 DIAGNOSIS — Z79899 Other long term (current) drug therapy: Secondary | ICD-10-CM

## 2013-02-05 DIAGNOSIS — M1710 Unilateral primary osteoarthritis, unspecified knee: Principal | ICD-10-CM | POA: Diagnosis present

## 2013-02-05 DIAGNOSIS — E785 Hyperlipidemia, unspecified: Secondary | ICD-10-CM | POA: Diagnosis present

## 2013-02-05 DIAGNOSIS — Z8249 Family history of ischemic heart disease and other diseases of the circulatory system: Secondary | ICD-10-CM

## 2013-02-05 DIAGNOSIS — E039 Hypothyroidism, unspecified: Secondary | ICD-10-CM | POA: Diagnosis present

## 2013-02-05 DIAGNOSIS — M1711 Unilateral primary osteoarthritis, right knee: Secondary | ICD-10-CM | POA: Diagnosis present

## 2013-02-05 DIAGNOSIS — I10 Essential (primary) hypertension: Secondary | ICD-10-CM | POA: Diagnosis present

## 2013-02-05 DIAGNOSIS — Z841 Family history of disorders of kidney and ureter: Secondary | ICD-10-CM

## 2013-02-05 HISTORY — PX: TOTAL KNEE ARTHROPLASTY: SHX125

## 2013-02-05 LAB — GLUCOSE, CAPILLARY
Glucose-Capillary: 117 mg/dL — ABNORMAL HIGH (ref 70–99)
Glucose-Capillary: 131 mg/dL — ABNORMAL HIGH (ref 70–99)
Glucose-Capillary: 181 mg/dL — ABNORMAL HIGH (ref 70–99)

## 2013-02-05 SURGERY — ARTHROPLASTY, KNEE, TOTAL
Anesthesia: General | Site: Knee | Laterality: Right | Wound class: Clean

## 2013-02-05 MED ORDER — FENTANYL CITRATE 0.05 MG/ML IJ SOLN
50.0000 ug | Freq: Once | INTRAMUSCULAR | Status: AC
  Administered 2013-02-05: 50 ug via INTRAVENOUS

## 2013-02-05 MED ORDER — SODIUM CHLORIDE 0.9 % IR SOLN
Status: DC | PRN
  Administered 2013-02-05: 1000 mL
  Administered 2013-02-05: 3000 mL

## 2013-02-05 MED ORDER — METOCLOPRAMIDE HCL 5 MG/ML IJ SOLN: 5.0000 mg | Freq: Three times a day (TID) | INTRAMUSCULAR | Status: DC | PRN

## 2013-02-05 MED ORDER — DOCUSATE SODIUM 100 MG PO CAPS
100.0000 mg | ORAL_CAPSULE | Freq: Two times a day (BID) | ORAL | Status: DC
  Administered 2013-02-05 – 2013-02-10 (×9): 100 mg via ORAL
  Filled 2013-02-05 (×11): qty 1

## 2013-02-05 MED ORDER — GLIMEPIRIDE 4 MG PO TABS
8.0000 mg | ORAL_TABLET | Freq: Every day | ORAL | Status: DC
  Administered 2013-02-06 – 2013-02-10 (×5): 8 mg via ORAL
  Filled 2013-02-05 (×6): qty 2

## 2013-02-05 MED ORDER — ONDANSETRON HCL 4 MG/2ML IJ SOLN
4.0000 mg | Freq: Four times a day (QID) | INTRAMUSCULAR | Status: DC | PRN
  Administered 2013-02-06: 4 mg via INTRAVENOUS
  Filled 2013-02-05: qty 2

## 2013-02-05 MED ORDER — SODIUM CHLORIDE 0.9 % IJ SOLN
INTRAMUSCULAR | Status: DC | PRN
  Administered 2013-02-05: 14:00:00

## 2013-02-05 MED ORDER — ROCURONIUM BROMIDE 100 MG/10ML IV SOLN
INTRAVENOUS | Status: DC | PRN
  Administered 2013-02-05: 50 mg via INTRAVENOUS

## 2013-02-05 MED ORDER — MENTHOL 3 MG MT LOZG: 1.0000 | LOZENGE | OROMUCOSAL | Status: DC | PRN

## 2013-02-05 MED ORDER — SODIUM CHLORIDE 0.9 % IV SOLN
1000.0000 mg | INTRAVENOUS | Status: AC
  Administered 2013-02-05: 1000 mg via INTRAVENOUS
  Filled 2013-02-05: qty 10

## 2013-02-05 MED ORDER — KCL IN DEXTROSE-NACL 20-5-0.45 MEQ/L-%-% IV SOLN
INTRAVENOUS | Status: AC
  Filled 2013-02-05: qty 1000

## 2013-02-05 MED ORDER — LEVOTHYROXINE SODIUM 75 MCG PO TABS
75.0000 ug | ORAL_TABLET | Freq: Every day | ORAL | Status: DC
  Administered 2013-02-06 – 2013-02-10 (×5): 75 ug via ORAL
  Filled 2013-02-05 (×6): qty 1

## 2013-02-05 MED ORDER — METHOCARBAMOL 500 MG PO TABS
500.0000 mg | ORAL_TABLET | Freq: Four times a day (QID) | ORAL | Status: DC | PRN
  Administered 2013-02-05 – 2013-02-10 (×11): 500 mg via ORAL
  Filled 2013-02-05 (×11): qty 1

## 2013-02-05 MED ORDER — ONDANSETRON HCL 4 MG/2ML IJ SOLN
INTRAMUSCULAR | Status: DC | PRN
  Administered 2013-02-05: 4 mg via INTRAVENOUS

## 2013-02-05 MED ORDER — PHENYLEPHRINE HCL 10 MG/ML IJ SOLN
INTRAMUSCULAR | Status: DC | PRN
  Administered 2013-02-05 (×7): 40 ug via INTRAVENOUS

## 2013-02-05 MED ORDER — OXYCODONE-ACETAMINOPHEN 5-325 MG PO TABS: 1.0000 | ORAL_TABLET | ORAL | Status: DC | PRN

## 2013-02-05 MED ORDER — KCL IN DEXTROSE-NACL 20-5-0.45 MEQ/L-%-% IV SOLN
INTRAVENOUS | Status: DC
  Administered 2013-02-06 – 2013-02-07 (×3): via INTRAVENOUS
  Filled 2013-02-05 (×18): qty 1000

## 2013-02-05 MED ORDER — SENNOSIDES-DOCUSATE SODIUM 8.6-50 MG PO TABS: 1.0000 | ORAL_TABLET | Freq: Every evening | ORAL | Status: DC | PRN

## 2013-02-05 MED ORDER — MAGNESIUM CITRATE PO SOLN
1.0000 | Freq: Once | ORAL | Status: AC | PRN
  Filled 2013-02-05: qty 296

## 2013-02-05 MED ORDER — ONDANSETRON HCL 4 MG/2ML IJ SOLN: 4.0000 mg | Freq: Once | INTRAMUSCULAR | Status: DC | PRN

## 2013-02-05 MED ORDER — ACETAMINOPHEN 325 MG PO TABS
650.0000 mg | ORAL_TABLET | Freq: Four times a day (QID) | ORAL | Status: DC | PRN
  Administered 2013-02-06 – 2013-02-09 (×7): 650 mg via ORAL
  Filled 2013-02-05 (×6): qty 2

## 2013-02-05 MED ORDER — AMLODIPINE BESYLATE 5 MG PO TABS
5.0000 mg | ORAL_TABLET | Freq: Every day | ORAL | Status: DC
  Administered 2013-02-06 – 2013-02-10 (×5): 5 mg via ORAL
  Filled 2013-02-05 (×5): qty 1

## 2013-02-05 MED ORDER — CEFUROXIME SODIUM 1.5 G IJ SOLR
INTRAMUSCULAR | Status: DC | PRN
  Administered 2013-02-05: 1.5 g

## 2013-02-05 MED ORDER — PROPOFOL 10 MG/ML IV BOLUS
INTRAVENOUS | Status: DC | PRN
  Administered 2013-02-05: 200 mg via INTRAVENOUS
  Administered 2013-02-05: 70 mg via INTRAVENOUS

## 2013-02-05 MED ORDER — ACETAMINOPHEN 650 MG RE SUPP
650.0000 mg | Freq: Four times a day (QID) | RECTAL | Status: DC | PRN
  Filled 2013-02-05: qty 1

## 2013-02-05 MED ORDER — INSULIN ASPART 100 UNIT/ML ~~LOC~~ SOLN
0.0000 [IU] | Freq: Three times a day (TID) | SUBCUTANEOUS | Status: DC
  Administered 2013-02-06: 3 [IU] via SUBCUTANEOUS
  Administered 2013-02-06: 2 [IU] via SUBCUTANEOUS
  Administered 2013-02-07: 3 [IU] via SUBCUTANEOUS
  Administered 2013-02-07: 2 [IU] via SUBCUTANEOUS
  Administered 2013-02-07: 3 [IU] via SUBCUTANEOUS
  Administered 2013-02-08: 2 [IU] via SUBCUTANEOUS
  Administered 2013-02-08: 3 [IU] via SUBCUTANEOUS
  Administered 2013-02-10: 2 [IU] via SUBCUTANEOUS

## 2013-02-05 MED ORDER — METFORMIN HCL 500 MG PO TABS
1000.0000 mg | ORAL_TABLET | Freq: Every day | ORAL | Status: DC
  Administered 2013-02-06 – 2013-02-10 (×5): 1000 mg via ORAL
  Filled 2013-02-05 (×6): qty 2

## 2013-02-05 MED ORDER — HYDROMORPHONE HCL PF 1 MG/ML IJ SOLN
INTRAMUSCULAR | Status: AC
  Filled 2013-02-05: qty 1

## 2013-02-05 MED ORDER — FENTANYL CITRATE 0.05 MG/ML IJ SOLN
INTRAMUSCULAR | Status: AC
  Filled 2013-02-05: qty 2

## 2013-02-05 MED ORDER — METHOCARBAMOL 500 MG PO TABS: 500.0000 mg | ORAL_TABLET | Freq: Two times a day (BID) | ORAL | Status: DC

## 2013-02-05 MED ORDER — MIDAZOLAM HCL 5 MG/5ML IJ SOLN
INTRAMUSCULAR | Status: DC | PRN
  Administered 2013-02-05 (×2): 1 mg via INTRAVENOUS

## 2013-02-05 MED ORDER — ALISKIREN FUMARATE 150 MG PO TABS
300.0000 mg | ORAL_TABLET | Freq: Every day | ORAL | Status: DC
  Administered 2013-02-06 – 2013-02-10 (×5): 300 mg via ORAL
  Filled 2013-02-05 (×7): qty 2

## 2013-02-05 MED ORDER — OXYCODONE HCL 5 MG PO TABS
5.0000 mg | ORAL_TABLET | ORAL | Status: DC | PRN
  Administered 2013-02-05 – 2013-02-10 (×14): 10 mg via ORAL
  Filled 2013-02-05 (×14): qty 2

## 2013-02-05 MED ORDER — LACTATED RINGERS IV SOLN
INTRAVENOUS | Status: DC | PRN
  Administered 2013-02-05 (×2): via INTRAVENOUS

## 2013-02-05 MED ORDER — ASPIRIN EC 325 MG PO TBEC
325.0000 mg | DELAYED_RELEASE_TABLET | Freq: Every day | ORAL | Status: DC
  Administered 2013-02-06 – 2013-02-10 (×5): 325 mg via ORAL
  Filled 2013-02-05 (×7): qty 1

## 2013-02-05 MED ORDER — DEXTROSE-NACL 5-0.45 % IV SOLN: INTRAVENOUS | Status: DC

## 2013-02-05 MED ORDER — ALISKIREN-HYDROCHLOROTHIAZIDE 300-25 MG PO TABS: 1.0000 | ORAL_TABLET | Freq: Every day | ORAL | Status: DC

## 2013-02-05 MED ORDER — ARTIFICIAL TEARS OP OINT
TOPICAL_OINTMENT | OPHTHALMIC | Status: DC | PRN
  Administered 2013-02-05: 1 via OPHTHALMIC

## 2013-02-05 MED ORDER — DIPHENHYDRAMINE HCL 12.5 MG/5ML PO ELIX: 12.5000 mg | ORAL_SOLUTION | ORAL | Status: DC | PRN

## 2013-02-05 MED ORDER — HYDROMORPHONE HCL PF 1 MG/ML IJ SOLN
0.5000 mg | INTRAMUSCULAR | Status: DC | PRN
  Administered 2013-02-06: 0.5 mg via INTRAVENOUS
  Filled 2013-02-05: qty 1

## 2013-02-05 MED ORDER — MIDAZOLAM HCL 5 MG/ML IJ SOLN
1.0000 mg | Freq: Once | INTRAMUSCULAR | Status: AC
  Administered 2013-02-05: 1 mg via INTRAVENOUS

## 2013-02-05 MED ORDER — NEOSTIGMINE METHYLSULFATE 1 MG/ML IJ SOLN
INTRAMUSCULAR | Status: DC | PRN
  Administered 2013-02-05: 4 mg via INTRAVENOUS

## 2013-02-05 MED ORDER — GLYCOPYRROLATE 0.2 MG/ML IJ SOLN
INTRAMUSCULAR | Status: DC | PRN
  Administered 2013-02-05: .2 mg via INTRAVENOUS
  Administered 2013-02-05: 0.6 mg via INTRAVENOUS

## 2013-02-05 MED ORDER — CEFUROXIME SODIUM 1.5 G IJ SOLR
INTRAMUSCULAR | Status: AC
  Filled 2013-02-05: qty 1.5

## 2013-02-05 MED ORDER — LINAGLIPTIN 5 MG PO TABS
5.0000 mg | ORAL_TABLET | Freq: Every day | ORAL | Status: DC
  Administered 2013-02-06 – 2013-02-10 (×5): 5 mg via ORAL
  Filled 2013-02-05 (×7): qty 1

## 2013-02-05 MED ORDER — ASPIRIN EC 325 MG PO TBEC: 325.0000 mg | DELAYED_RELEASE_TABLET | Freq: Two times a day (BID) | ORAL | Status: DC

## 2013-02-05 MED ORDER — ALUM & MAG HYDROXIDE-SIMETH 200-200-20 MG/5ML PO SUSP: 30.0000 mL | ORAL | Status: DC | PRN

## 2013-02-05 MED ORDER — METOCLOPRAMIDE HCL 10 MG PO TABS: 5.0000 mg | ORAL_TABLET | Freq: Three times a day (TID) | ORAL | Status: DC | PRN

## 2013-02-05 MED ORDER — METHOCARBAMOL 100 MG/ML IJ SOLN
500.0000 mg | Freq: Four times a day (QID) | INTRAVENOUS | Status: DC | PRN
  Filled 2013-02-05: qty 5

## 2013-02-05 MED ORDER — FENTANYL CITRATE 0.05 MG/ML IJ SOLN
INTRAMUSCULAR | Status: DC | PRN
  Administered 2013-02-05: 50 ug via INTRAVENOUS
  Administered 2013-02-05 (×4): 25 ug via INTRAVENOUS

## 2013-02-05 MED ORDER — BUPIVACAINE LIPOSOME 1.3 % IJ SUSP
20.0000 mL | Freq: Once | INTRAMUSCULAR | Status: DC
  Filled 2013-02-05: qty 20

## 2013-02-05 MED ORDER — HYDROMORPHONE HCL PF 1 MG/ML IJ SOLN
0.2500 mg | INTRAMUSCULAR | Status: DC | PRN
  Administered 2013-02-05: 0.5 mg via INTRAVENOUS

## 2013-02-05 MED ORDER — LIDOCAINE HCL (CARDIAC) 20 MG/ML IV SOLN
INTRAVENOUS | Status: DC | PRN
  Administered 2013-02-05: 60 mg via INTRAVENOUS

## 2013-02-05 MED ORDER — BISACODYL 5 MG PO TBEC: 5.0000 mg | DELAYED_RELEASE_TABLET | Freq: Every day | ORAL | Status: DC | PRN

## 2013-02-05 MED ORDER — ONDANSETRON HCL 4 MG PO TABS: 4.0000 mg | ORAL_TABLET | Freq: Four times a day (QID) | ORAL | Status: DC | PRN

## 2013-02-05 MED ORDER — MIDAZOLAM HCL 2 MG/2ML IJ SOLN
INTRAMUSCULAR | Status: AC
  Filled 2013-02-05: qty 2

## 2013-02-05 MED ORDER — CHLORHEXIDINE GLUCONATE 4 % EX LIQD: 60.0000 mL | Freq: Once | CUTANEOUS | Status: DC

## 2013-02-05 MED ORDER — PHENOL 1.4 % MT LIQD: 1.0000 | OROMUCOSAL | Status: DC | PRN

## 2013-02-05 MED ORDER — HYDROCHLOROTHIAZIDE 25 MG PO TABS
25.0000 mg | ORAL_TABLET | Freq: Every day | ORAL | Status: DC
  Administered 2013-02-06 – 2013-02-10 (×5): 25 mg via ORAL
  Filled 2013-02-05 (×7): qty 1

## 2013-02-05 SURGICAL SUPPLY — 64 items
BANDAGE ESMARK 6X9 LF (GAUZE/BANDAGES/DRESSINGS) ×1 IMPLANT
BLADE SAG 18X100X1.27 (BLADE) ×2 IMPLANT
BLADE SAW SGTL 13X75X1.27 (BLADE) ×2 IMPLANT
BLADE SURG ROTATE 9660 (MISCELLANEOUS) IMPLANT
BNDG CMPR MED 10X6 ELC LF (GAUZE/BANDAGES/DRESSINGS) ×1
BNDG ELASTIC 6X10 VLCR STRL LF (GAUZE/BANDAGES/DRESSINGS) ×2 IMPLANT
BNDG ESMARK 6X9 LF (GAUZE/BANDAGES/DRESSINGS) ×2
BOWL SMART MIX CTS (DISPOSABLE) ×2 IMPLANT
CAPT RP KNEE ×2 IMPLANT
CEMENT HV SMART SET (Cement) ×4 IMPLANT
CLOTH BEACON ORANGE TIMEOUT ST (SAFETY) IMPLANT
COVER SURGICAL LIGHT HANDLE (MISCELLANEOUS) ×2 IMPLANT
CUFF TOURNIQUET SINGLE 34IN LL (TOURNIQUET CUFF) IMPLANT
CUFF TOURNIQUET SINGLE 44IN (TOURNIQUET CUFF) ×2 IMPLANT
DRAPE EXTREMITY T 121X128X90 (DRAPE) ×2 IMPLANT
DRAPE ORTHO SPLIT 77X108 STRL (DRAPES) ×2
DRAPE SURG ORHT 6 SPLT 77X108 (DRAPES) ×1 IMPLANT
DRAPE U-SHAPE 47X51 STRL (DRAPES) ×2 IMPLANT
DURAPREP 26ML APPLICATOR (WOUND CARE) ×2 IMPLANT
ELECT REM PT RETURN 9FT ADLT (ELECTROSURGICAL) ×2
ELECTRODE REM PT RTRN 9FT ADLT (ELECTROSURGICAL) ×1 IMPLANT
EVACUATOR 1/8 PVC DRAIN (DRAIN) ×2 IMPLANT
GAUZE XEROFORM 1X8 LF (GAUZE/BANDAGES/DRESSINGS) ×2 IMPLANT
GLOVE BIO SURGEON STRL SZ7.5 (GLOVE) ×4 IMPLANT
GLOVE BIO SURGEON STRL SZ8.5 (GLOVE) ×2 IMPLANT
GLOVE BIOGEL PI IND STRL 6.5 (GLOVE) ×1 IMPLANT
GLOVE BIOGEL PI IND STRL 7.0 (GLOVE) ×1 IMPLANT
GLOVE BIOGEL PI IND STRL 8 (GLOVE) ×1 IMPLANT
GLOVE BIOGEL PI IND STRL 9 (GLOVE) ×1 IMPLANT
GLOVE BIOGEL PI INDICATOR 6.5 (GLOVE) ×1
GLOVE BIOGEL PI INDICATOR 7.0 (GLOVE) ×1
GLOVE BIOGEL PI INDICATOR 8 (GLOVE) ×1
GLOVE BIOGEL PI INDICATOR 9 (GLOVE) ×1
GLOVE SURG SS PI 6.5 STRL IVOR (GLOVE) ×2 IMPLANT
GOWN PREVENTION PLUS XLARGE (GOWN DISPOSABLE) ×2 IMPLANT
GOWN STRL NON-REIN LRG LVL3 (GOWN DISPOSABLE) ×2 IMPLANT
GOWN STRL REIN XL XLG (GOWN DISPOSABLE) ×2 IMPLANT
HANDPIECE INTERPULSE COAX TIP (DISPOSABLE) ×2
HOOD PEEL AWAY FACE SHEILD DIS (HOOD) ×2 IMPLANT
KIT BASIN OR (CUSTOM PROCEDURE TRAY) ×2 IMPLANT
KIT ROOM TURNOVER OR (KITS) ×2 IMPLANT
MANIFOLD NEPTUNE II (INSTRUMENTS) ×2 IMPLANT
NDL SAFETY ECLIPSE 18X1.5 (NEEDLE) IMPLANT
NEEDLE HYPO 18GX1.5 SHARP (NEEDLE)
NEEDLE SPNL 18GX3.5 QUINCKE PK (NEEDLE) ×2 IMPLANT
NS IRRIG 1000ML POUR BTL (IV SOLUTION) ×2 IMPLANT
PACK TOTAL JOINT (CUSTOM PROCEDURE TRAY) ×2 IMPLANT
PAD ARMBOARD 7.5X6 YLW CONV (MISCELLANEOUS) ×2 IMPLANT
PADDING CAST COTTON 6X4 STRL (CAST SUPPLIES) ×2 IMPLANT
SET HNDPC FAN SPRY TIP SCT (DISPOSABLE) ×1 IMPLANT
SPONGE GAUZE 4X4 12PLY (GAUZE/BANDAGES/DRESSINGS) ×4 IMPLANT
STAPLER VISISTAT 35W (STAPLE) ×2 IMPLANT
SUCTION FRAZIER TIP 10 FR DISP (SUCTIONS) ×2 IMPLANT
SUT VIC AB 0 CTX 36 (SUTURE) ×1
SUT VIC AB 0 CTX36XBRD ANTBCTR (SUTURE) ×1 IMPLANT
SUT VIC AB 1 CTX 36 (SUTURE) ×2
SUT VIC AB 1 CTX36XBRD ANBCTR (SUTURE) ×1 IMPLANT
SUT VIC AB 2-0 CT1 27 (SUTURE) ×2
SUT VIC AB 2-0 CT1 TAPERPNT 27 (SUTURE) ×1 IMPLANT
SYR 50ML LL SCALE MARK (SYRINGE) ×2 IMPLANT
TOWEL OR 17X24 6PK STRL BLUE (TOWEL DISPOSABLE) ×2 IMPLANT
TOWEL OR 17X26 10 PK STRL BLUE (TOWEL DISPOSABLE) ×2 IMPLANT
TRAY FOLEY CATH 14FR (SET/KITS/TRAYS/PACK) ×2 IMPLANT
WATER STERILE IRR 1000ML POUR (IV SOLUTION) IMPLANT

## 2013-02-05 NOTE — Anesthesia Preprocedure Evaluation (Addendum)
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 on medications + dysrhythmias Rhythm:Regular Rate:Normal  L BBB   Neuro/Psych    GI/Hepatic negative GI ROS, Neg liver ROS,   Endo/Other  diabetes, Well Controlled, Type 2, Oral Hypoglycemic AgentsHypothyroidism   Renal/GU negative Renal ROS     Musculoskeletal   Abdominal   Peds  Hematology   Anesthesia Other Findings   Reproductive/Obstetrics                          Anesthesia Physical Anesthesia Plan  ASA: III  Anesthesia Plan: General   Post-op Pain Management:    Induction: Intravenous  Airway Management Planned: LMA and Oral ETT  Additional Equipment:   Intra-op Plan:   Post-operative Plan: Extubation in OR  Informed Consent: I have reviewed the patients History and Physical, chart, labs and discussed the procedure including the risks, benefits and alternatives for the proposed anesthesia with the patient or authorized representative who has indicated his/her understanding and acceptance.   Dental advisory given  Plan Discussed with: CRNA, Anesthesiologist and Surgeon  Anesthesia Plan Comments:         Anesthesia Quick Evaluation

## 2013-02-05 NOTE — Op Note (Signed)
PATIENT ID:      Rickey Rice  MRN:     [id number] DOB/AGE:    72/19/1942 / 72 y.o.       OPERATIVE REPORT    DATE OF PROCEDURE:  02/05/2013       PREOPERATIVE DIAGNOSIS:   OSTEOARTHRITIS RIGHT KNEE      Estimated body mass index is 45.84 kg/(m^2) as calculated from the following:   Height as of 01/29/13: 5' 11.5" (1.816 m).   Weight as of 01/07/13: 151.184 kg (333 lb 4.8 oz).                                                        POSTOPERATIVE DIAGNOSIS:   OSTEOARTHRITIS RIGHT KNEE                                                                      PROCEDURE:  Procedure(s): TOTAL KNEE ARTHROPLASTY Using Depuy Sigma RP implants #5R Femur, #5Tibia, 10mm sigma RP bearing, 38 Patella     SURGEON: Rickey Rice    ASSISTANT:   Rickey Rice   (Present and scrubbed throughout the case, critical for assistance with exposure, retraction, instrumentation, and closure.)         ANESTHESIA: GET Exparel  DRAINS: foley, 2 medium hemovac in knee   TOURNIQUET TIME:   COMPLICATIONS:  None     SPECIMENS: None   INDICATIONS FOR PROCEDURE: The patient has  OSTEOARTHRITIS RIGHT KNEE, varus deformities, XR shows bone on bone arthritis. Patient has failed all conservative measures including anti-inflammatory medicines, narcotics, attempts at  exercise and weight loss, cortisone injections and viscosupplementation.  Risks and benefits of surgery have been discussed, questions answered.   DESCRIPTION OF PROCEDURE: The patient identified by armband, received  IV antibiotics, in the holding area at [facility]. Patient taken to the operating room, appropriate anesthetic  monitors were attached, and general endotracheal anesthesia induced with  the patient in supine position, Foley catheter was inserted. Tourniquet  applied high to the operative thigh. Lateral post and foot positioner  applied to the table, the lower extremity was then prepped and draped  in usual sterile fashion  from the ankle to the tourniquet. Time-out procedure was performed. The limb was wrapped with an Esmarch bandage and the tourniquet inflated to 350 mmHg. We began the operation by making the anterior midline incision starting at handbreadth above the patella going over the patella 1 cm medial to and  4 cm distal to the tibial tubercle. Small bleeders in the skin and the  subcutaneous tissue identified and cauterized. Transverse retinaculum was incised and reflected medially and a medial parapatellar arthrotomy was accomplished. the patella was everted and theprepatellar fat pad resected. The superficial medial collateral  ligament was then elevated from anterior to posterior along the proximal  flare of the tibia and anterior half of the menisci resected. The knee was hyperflexed exposing bone on bone arthritis. Peripheral and notch osteophytes as well as the cruciate ligaments were then resected. We continued to  work our way around posteriorly  along the proximal tibia, and externally  rotated the tibia subluxing it out from underneath the femur. A McHale  retractor was placed through the notch and a lateral Hohmann retractor  placed, and we then drilled through the proximal tibia in line with the  axis of the tibia followed by an intramedullary guide rod and 2-degree  posterior slope cutting guide. The tibial cutting guide was pinned into place  allowing resection of 6 mm of bone medially and about 12 mm of bone  laterally because of her varus deformity. Satisfied with the tibial resection, we then  entered the distal femur 2 mm anterior to the PCL origin with the  intramedullary guide rod and applied the distal femoral cutting guide  set at 11mm, with 5 degrees of valgus. This was pinned along the  epicondylar axis. At this point, the distal femoral cut was accomplished without difficulty. We then sized for a #5R femoral component and pinned the guide in 3 degrees of external rotation.The chamfer  cutting guide was pinned into place. The anterior, posterior, and chamfer cuts were accomplished without difficulty followed by  the Sigma RP box cutting guide and the box cut. We also removed posterior osteophytes from the posterior femoral condyles. At this  time, the knee was brought into full extension. We checked our  extension and flexion gaps and found them symmetric at 10mm.  The patella thickness measured at 25 mm. We set the cutting guide at 15 and removed the posterior 10 mm  of the patella, sized for a 38 button and drilled the lollipop. The knee  was then once again hyperflexed exposing the proximal tibia. We sized for a #5 tibial base plate, applied the smokestack and the conical reamer followed by the the Delta fin keel punch. We then hammered into place the Sigma RP trial femoral component, inserted a 10-mm trial bearing, trial patellar button, and took the knee through range of motion from 0-130 degrees. No thumb pressure was required for patellar  tracking. At this point, all trial components were removed, a double batch of DePuy HV cement with 1500 mg of Zinacef was mixed and applied to all bony metallic mating surfaces except for the posterior condyles of the femur itself. In order, we  hammered into place the tibial tray and removed excess cement, the femoral component and removed excess cement, a 10-mm Sigma RP bearing  was inserted, and the knee brought to full extension with compression.  The patellar button was clamped into place, and excess cement  removed. While the cement cured the wound was irrigated out with normal saline solution pulse lavage, and medium Hemovac drains were placed from an anterolateral  approach. Ligament stability and patellar tracking were checked and found to be excellent. The parapatellar arthrotomy was closed with  running #1 Vicryl suture. The subcutaneous tissue with 0 and 2-0 undyed  Vicryl suture, and the skin with skin staples. A dressing of  Xeroform,  4 x 4, dressing sponges, Webril, and Ace wrap applied. The patient  awakened, extubated, and taken to recovery room without difficulty.   Rickey Rice 02/05/2013, 2:40 PM

## 2013-02-05 NOTE — Preoperative (Signed)
Beta Blockers   Reason not to administer Beta Blockers:Not Applicable 

## 2013-02-05 NOTE — Transfer of Care (Signed)
Immediate Anesthesia Transfer of Care Note  Patient: Rickey Rice  Procedure(s) Performed: Procedure(s): TOTAL KNEE ARTHROPLASTY (Right)  Patient Location: PACU  Anesthesia Type:General  Level of Consciousness: awake, alert , oriented and patient cooperative  Airway & Oxygen Therapy: Patient connected to face mask oxygen  Post-op Assessment: Report given to PACU RN, Post -op Vital signs reviewed and stable and Patient moving all extremities  Post vital signs: Reviewed and stable  Complications: No apparent anesthesia complications

## 2013-02-05 NOTE — Anesthesia Postprocedure Evaluation (Signed)
  Anesthesia Post-op Note  Patient: Rickey Rice  Procedure(s) Performed: Procedure(s): TOTAL KNEE ARTHROPLASTY (Right)  Patient Location: PACU  Anesthesia Type:General  Level of Consciousness: awake, alert , oriented and patient cooperative  Airway and Oxygen Therapy: Patient Spontanous Breathing  Post-op Pain: mild  Post-op Assessment: Post-op Vital signs reviewed, Patient's Cardiovascular Status Stable, Respiratory Function Stable, Patent Airway, No signs of Nausea or vomiting and Pain level controlled  Post-op Vital Signs: stable  Complications: No apparent anesthesia complications

## 2013-02-05 NOTE — Anesthesia Procedure Notes (Addendum)
Anesthesia Regional Block:  Adductor canal block  Pre-Anesthetic Checklist: ,, timeout performed, Correct Patient, Correct Site, Correct Laterality, Correct Procedure, Correct Position, site marked, Risks and benefits discussed,  Surgical consent,  Pre-op evaluation,  At surgeon's request and post-op pain management  Laterality: Right  Prep: chloraprep       Needles:  Injection technique: Single-shot  Needle Type: Echogenic Stimulator Needle      Needle Gauge: 22 and 22 G    Additional Needles:  Procedures: ultrasound guided (picture in chart) Adductor canal block Narrative:  Start time: 02/05/2013 12:00 PM End time: 02/05/2013 12:10 PM Injection made incrementally with aspirations every 5 mL.  Performed by: Personally   Additional Notes: 25 cc 0.5% Marcaine with 1:200 Epi injected easily  Adductor canal block Procedure Name: LMA Insertion and Intubation Date/Time: 02/05/2013 1:11 PM Performed by: Angelica Pou Pre-anesthesia Checklist: Patient identified, Timeout performed, Emergency Drugs available, Suction available and Patient being monitored Patient Re-evaluated:Patient Re-evaluated prior to inductionOxygen Delivery Method: Circle system utilized Preoxygenation: Pre-oxygenation with 100% oxygen Intubation Type: IV induction Ventilation: Two handed mask ventilation required and Oral airway inserted - appropriate to patient size LMA: LMA inserted LMA Size: 5.0 Laryngoscope Size: Mac and 4 Grade View: Grade I Tube type: Oral Tube size: 7.5 mm Number of attempts: 1 Airway Equipment and Method: Stylet Placement Confirmation: ETT inserted through vocal cords under direct vision,  breath sounds checked- equal and bilateral and positive ETCO2 Secured at: 23 cm Tube secured with: Tape Dental Injury: Teeth and Oropharynx as per pre-operative assessment  Comments: Smooth IV induction by Dr Noreene Larsson. Attempted to place LMA, x1 by CRNA, x1 by MDA. Both attempts  resulted in air leak at 18 with insufficient tidal volumes. Decision made to intubate. Add'l propofol given (see anes. record), 2-handed mask ventilation using OPA, easy ATOI by Katrinka Blazing CRNA using Mac 4. Dry chapped lips resulted in small laceration to upper lip, lubricant applied.

## 2013-02-05 NOTE — Interval H&P Note (Signed)
History and Physical Interval Note:  02/05/2013 12:46 PM  Rickey Rice  has presented today for surgery, with the diagnosis of OSTEOARTHRITIS RIGHT KNEE  The various methods of treatment have been discussed with the patient and family. After consideration of risks, benefits and other options for treatment, the patient has consented to  Procedure(s): TOTAL KNEE ARTHROPLASTY (Right) as a surgical intervention .  The patient's history has been reviewed, patient examined, no change in status, stable for surgery.  I have reviewed the patient's chart and labs.  Questions were answered to the patient's satisfaction.     Nestor Lewandowsky

## 2013-02-05 NOTE — Progress Notes (Signed)
Orthopedic Tech Progress Note Patient Details:  Rickey Rice December 29, 1940 [id number] Put on ohf CPM Right Knee CPM Right Knee: On Right Knee Flexion (Degrees): 60 Right Knee Extension (Degrees): 0   Jennye Moccasin 02/05/2013, 8:19 PM

## 2013-02-06 ENCOUNTER — Encounter (HOSPITAL_COMMUNITY): Payer: Self-pay | Admitting: General Practice

## 2013-02-06 DIAGNOSIS — M1711 Unilateral primary osteoarthritis, right knee: Secondary | ICD-10-CM | POA: Diagnosis present

## 2013-02-06 LAB — CBC
HCT: 30.7 % — ABNORMAL LOW (ref 39.0–52.0)
Hemoglobin: 10.8 g/dL — ABNORMAL LOW (ref 13.0–17.0)
MCHC: 35.2 g/dL (ref 30.0–36.0)
MCV: 91.1 fL (ref 78.0–100.0)
RBC: 3.37 MIL/uL — ABNORMAL LOW (ref 4.22–5.81)
RDW: 13.2 % (ref 11.5–15.5)
WBC: 6.3 10*3/uL (ref 4.0–10.5)

## 2013-02-06 LAB — GLUCOSE, CAPILLARY: Glucose-Capillary: 157 mg/dL — ABNORMAL HIGH (ref 70–99)

## 2013-02-06 NOTE — Progress Notes (Signed)
02/07/13 Set up with HHPT, HHOT and HHRN with [facility] by MD office. PT and OT recommended SNF. Discharge plan is now for SNF.Referral made to CSW. Kiearra Oyervides with [facility] informed of change in discharge plan. Will follow for discharge needs. Jacquelynn Cree RN, BSN, CCM

## 2013-02-06 NOTE — Progress Notes (Signed)
UR COMPLETED  

## 2013-02-06 NOTE — Progress Notes (Signed)
Patient ID: Rickey Rice, male   DOB: May 23, 1940, 72 y.o.   MRN: [id number] PATIENT ID: Rickey Rice  MRN: [id number]  DOB/AGE:  Aug 03, 1940 / 72 y.o.  1 Day Post-Op Procedure(s) (LRB): TOTAL KNEE ARTHROPLASTY (Right)    PROGRESS NOTE Subjective: Patient is alert, oriented, no Nausea, no Vomiting, yes passing gas, no Bowel Movement. Taking PO well. Denies SOB, Chest or Calf Pain. Using Incentive Spirometer, PAS in place. Ambulate WBAT today, CPM 72-60 Patient reports pain as 6 on 0-10 scale  .    Objective: Vital signs in last 24 hours: Filed Vitals:   02/05/13 1820 02/05/13 2143 02/06/13 0201 02/06/13 0642  BP: 142/71 122/65 151/69 114/52  Pulse: 72 78 69 68  Temp: 99.1 F (37.3 C) 100.2 F (37.9 C) 100.4 F (38 C) 100.5 F (38.1 C)  TempSrc:   Oral Oral  Resp: 18 20 20 19   SpO2: 95% 95% 96% 97%      Intake/Output from previous day: I/O last 3 completed shifts: In: 3068.8 [P.O.:500; I.V.:2568.8] Out: 1655 [Urine:1550; Drains:105]   Intake/Output this shift:     LABORATORY DATA:  Recent Labs  02/05/13 1534 02/05/13 2206 02/06/13 0418 02/06/13 0623  WBC  --   --  6.3  --   HGB  --   --  10.8*  --   HCT  --   --  30.7*  --   PLT  --   --  170  --   GLUCAP 131* 181*  --  157*    Examination: Neurologically intact ABD soft Neurovascular intact Sensation intact distally Intact pulses distally Dorsiflexion/Plantar flexion intact Incision: no drainage No cellulitis present Compartment soft} Blood and plasma separated in drain indicating minimal recent drainage, drain pulled without difficulty.  Assessment:   1 Day Post-Op Procedure(s) (LRB): TOTAL KNEE ARTHROPLASTY (Right) ADDITIONAL DIAGNOSIS:  Diabetes, Hypertension, Cardiac Arrythmia LBB and morbid ocesity  Plan: PT/OT WBAT, CPM 5/hrs day until ROM 0-90 degrees, then D/C CPM DVT Prophylaxis:  SCDx72hrs, ASA 325 mg BID x 2 weeks DISCHARGE PLAN: Skilled Nursing Facility/Rehab, patient and family  would like the [address] in [place], [place], please place at [facility] on chart  DISCHARGE NEEDS: HHPT, HHRN, CPM, Walker and 3-in-1 comode seat     Sahvannah Rieser J 02/06/2013, 8:33 AM

## 2013-02-06 NOTE — Evaluation (Signed)
Occupational Therapy Evaluation and Discharge Patient Details Name: Rickey Rice MRN: [id number] DOB: 1940/06/17 Today's Date: 02/06/2013 Time: 4098-1191 OT Time Calculation (min): 35 min  OT Assessment / Plan / Recommendation History of present illness RTKA with bad left knee as well, obesity   Clinical Impression   This 72 yo male admitted and underwent above presents to acute OT with deficits below. Will benefit from continued OT at SNF. D/C from acute OT.   OT Assessment  All further OT needs can be met in the next venue of care    Follow Up Recommendations  SNF       Equipment Recommendations   (TBD at next venue)          Precautions / Restrictions Precautions Precautions: Fall;Knee Restrictions Weight Bearing Restrictions: No Other Position/Activity Restrictions: WBAT   Pertinent Vitals/Pain 2/10 at rest; 6/10 with activity--RN made aware and RLE repositioned    ADL  Equipment Used: Gait belt;Rolling walker Transfers/Ambulation Related to ADLs: Total A +2 (60-70%) sit<>stand and Mod A ambulation with RW 5 steps ADL Comments: setup for UB ADLs unsupported sitting, max-total A for LBADLs sit<>stand    OT Diagnosis: Generalized weakness;Acute pain  OT Problem List: Decreased range of motion;Decreased activity tolerance;Impaired balance (sitting and/or standing);Pain;Obesity;Decreased knowledge of use of DME or AE  Acute Rehab OT Goals Patient Stated Goal: to go to [facility] and then home  Visit Information  Last OT Received On: 02/06/13 Assistance Needed: +2 History of Present Illness: RTKA with bad left knee as well, obesity       Prior Functioning     Home Living Family/patient expects to be discharged to:: Skilled nursing facility Living Arrangements: Spouse/significant other Available Help at Discharge: Family;Available 24 hours/day Type of Home: House Home Access: Stairs to enter [employer] of Steps: 2 Home Layout: One  level Home Equipment: Walker - 4 wheels Prior Function Level of Independence: Independent Communication Communication: No difficulties Dominant Hand: Right         Vision/Perception Vision - History Patient Visual Report: No change from baseline   Cognition  Cognition Arousal/Alertness: Awake/alert Behavior During Therapy: WFL for tasks assessed/performed Overall Cognitive Status: Within Functional Limits for tasks assessed    Extremity/Trunk Assessment Upper Extremity Assessment Upper Extremity Assessment: Overall WFL for tasks assessed Lower Extremity Assessment Lower Extremity Assessment: Defer to PT evaluation     Mobility Bed Mobility Bed Mobility: Supine to Sit;Sitting - Scoot to Edge of Bed Supine to Sit: 3: Mod assist;HOB elevated;With rails Sitting - Scoot to Edge of Bed: 4: Min assist;With rail Details for Bed Mobility Assistance: VCs for technique and sequecing Transfers Transfers: Sit to Stand;Stand to Sit Sit to Stand: 1: +2 Total assist;From elevated surface;With upper extremity assist;From bed Sit to Stand: Patient Percentage: 60% Stand to Sit: 1: +2 Total assist;With upper extremity assist;With armrests;To chair/3-in-1 Stand to Sit: Patient Percentage: 70% Details for Transfer Assistance: VCs for safe hand placement           End of Session OT - End of Session Equipment Utilized During Treatment: Gait belt;Rolling walker Activity Tolerance: Patient limited by fatigue;Patient limited by pain (limited by pain in LLE (good leg)) Patient left: in chair;with call bell/phone within reach;with family/visitor present        Evette Georges [phone] 02/06/2013, 11:55 AM

## 2013-02-06 NOTE — Progress Notes (Signed)
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   MRN: [id number] Placed pt's rle in cpm @0 -60 degrees @1445   Nikki Dom 02/06/2013, 2:37 PM

## 2013-02-06 NOTE — Evaluation (Signed)
Physical Therapy Evaluation Patient Details Name: Rickey Rice MRN: [id number] DOB: 06-02-1940 Today's Date: 02/06/2013 Time: 4098-1191 PT Time Calculation (min): 44 min  PT Assessment / Plan / Recommendation History of Present Illness  RTKA with bad left knee as well, obesity  Clinical Impression  Pt presents with dependencies in mobility secondary to R TKR and Left knee weakness and joint crepitus. Pt's obesity and Bilateral LE weakness will make his recovery slower. Recommend D/C to SNF. Pt will continue to benefit from skilled PT in the acute setting to maximize his mobility and Independence for the next venue of care.    PT Assessment  Patient needs continued PT services    Follow Up Recommendations  SNF    Does the patient have the potential to tolerate intense rehabilitation      Barriers to Discharge        Equipment Recommendations  None recommended by PT    Recommendations for Other Services     Frequency 7X/week    Precautions / Restrictions Precautions Precautions: Fall Restrictions Weight Bearing Restrictions: No Other Position/Activity Restrictions: WBAT   Pertinent Vitals/Pain       Mobility  Bed Mobility Bed Mobility: Sit to Supine Supine to Sit: 3: Mod assist;HOB elevated;With rails Sitting - Scoot to Edge of Bed: 4: Min assist;With rail Details for Bed Mobility Assistance: trapeze bar to assist with scooting hips over in  bed. Pt +2 total to laterally shift hips over and up in the bed with the trapeze. Transfers Transfers: Sit to Stand;Stand to Sit Sit to Stand: 1: +2 Total assist;With upper extremity assist;With armrests;From chair/3-in-1 Sit to Stand: Patient Percentage: 60% Stand to Sit: 1: +2 Total assist;To bed;With upper extremity assist Stand to Sit: Patient Percentage: 60% Details for Transfer Assistance: Max cues to increase forward trunk flexion for initial rise up, cues for hand placement Ambulation/Gait Ambulation/Gait  Assistance: 1: +2 Total assist Ambulation/Gait: Patient Percentage: 70% Ambulation Distance (Feet): 11 Feet Assistive device: Rolling walker Ambulation/Gait Assistance Details: Pt has significant joint crepitis noted during gait. Pt was min assist at times with gait, but when fatigued he needed +2 total assist to slide bed over and therapist to support him and encourage him to stay up until it was safe to sit.  Gait Pattern: Step-to pattern;Decreased stride length Gait velocity: slow General Gait Details: + 2 assist for safety and will need to bring chair behind. Stairs: No    Exercises Total Joint Exercises Ankle Circles/Pumps: AROM;Strengthening;Both;10 reps;Supine Quad Sets: AROM;Strengthening;Both;10 reps;Supine Hip ABduction/ADduction: AAROM;Strengthening;Right;5 reps;Supine Straight Leg Raises: AAROM;Strengthening;Both;5 reps;Supine Long Arc Quad: AAROM;Strengthening;Both;5 reps;Seated Knee Flexion: AAROM;Strengthening;Right;5 reps;Seated Goniometric ROM: 5-70 *   PT Diagnosis: Difficulty walking  PT Problem List: Decreased strength;Decreased range of motion;Decreased activity tolerance;Decreased mobility;Decreased balance;Decreased knowledge of use of DME;Decreased safety awareness;Obesity;Pain PT Treatment Interventions: DME instruction;Gait training;Therapeutic activities;Therapeutic exercise;Patient/family education     PT Goals(Current goals can be found in the care plan section) Acute Rehab PT Goals Patient Stated Goal: To go to  PT Goal Formulation: With patient Time For Goal Achievement: 02/13/13 Potential to Achieve Goals: Good  Visit Information  Last PT Received On: 02/06/13 Assistance Needed: +2 History of Present Illness: RTKA with bad left knee as well, obesity       Prior Functioning  Home Living Family/patient expects to be discharged to:: Skilled nursing facility Living Arrangements: Spouse/significant other Available Help at Discharge:  Family;Available 24 hours/day Type of Home: House Home Access: Stairs to enter [employer] of Steps: 2  or 3 depending on which enterence Home Layout: One level Home Equipment: Walker - 4 wheels Prior Function Level of Independence: Independent with assistive device(s) Communication Communication: No difficulties Dominant Hand: Right    Cognition  Cognition Arousal/Alertness: Awake/alert Behavior During Therapy: WFL for tasks assessed/performed Overall Cognitive Status: Within Functional Limits for tasks assessed    Extremity/Trunk Assessment Upper Extremity Assessment Upper Extremity Assessment: Defer to OT evaluation Lower Extremity Assessment Lower Extremity Assessment: RLE deficits/detail;LLE deficits/detail RLE Deficits / Details: knee flexion 5-70*, ankle WFL, knee flexion/extension 2/5, hip flexion 1/5 LLE Deficits / Details: hip flexion 2/5, knee ext/flexion 3/5, ankle [facility]   Balance Balance Balance Assessed: Yes Static Standing Balance Static Standing - Balance Support: Bilateral upper extremity supported Static Standing - Level of Assistance: 4: Min assist  End of Session PT - End of Session Equipment Utilized During Treatment: Gait belt Activity Tolerance: Patient limited by fatigue Patient left: in bed;with call bell/phone within reach;with family/visitor present Nurse Communication: Mobility status  GP     Greggory Stallion 02/06/2013, 1:18 PM

## 2013-02-07 ENCOUNTER — Inpatient Hospital Stay (HOSPITAL_COMMUNITY): Payer: Medicare Other

## 2013-02-07 LAB — GLUCOSE, CAPILLARY
Glucose-Capillary: 190 mg/dL — ABNORMAL HIGH (ref 70–99)
Glucose-Capillary: 167 mg/dL — ABNORMAL HIGH (ref 70–99)
Glucose-Capillary: 133 mg/dL — ABNORMAL HIGH (ref 70–99)
Glucose-Capillary: 158 mg/dL — ABNORMAL HIGH (ref 70–99)

## 2013-02-07 LAB — CBC
HCT: 28.8 % — ABNORMAL LOW (ref 39.0–52.0)
MCH: 30.9 pg (ref 26.0–34.0)
MCHC: 33.7 g/dL (ref 30.0–36.0)
MCV: 91.7 fL (ref 78.0–100.0)
RDW: 13.4 % (ref 11.5–15.5)

## 2013-02-07 LAB — URINALYSIS, ROUTINE W REFLEX MICROSCOPIC
Leukocytes, UA: NEGATIVE
Nitrite: NEGATIVE
Specific Gravity, Urine: 1.017 (ref 1.005–1.030)
Urobilinogen, UA: 1 mg/dL (ref 0.0–1.0)
pH: 5.5 (ref 5.0–8.0)

## 2013-02-07 NOTE — Progress Notes (Signed)
PATIENT ID: DERL ABALOS  MRN: [id number]  DOB/AGE:  [birth year] / 72 y.o.  2 Days Post-Op Procedure(s) (LRB): TOTAL KNEE ARTHROPLASTY (Right)    PROGRESS NOTE Subjective: Patient is alert, oriented, no Nausea, no Vomiting, yes passing gas, no Bowel Movement. Taking PO well. Denies SOB, Chest or Calf Pain. Using Incentive Spirometer, PAS in place. Ambulate WBAT, CPM 0-60 Patient reports pain as moderate  .    Objective: Vital signs in last 24 hours: Filed Vitals:   02/06/13 0642 02/06/13 1400 02/06/13 2029 02/07/13 0518  BP: 114/52 102/52 137/63 129/62  Pulse: 68 80 90 103  Temp: 100.5 F (38.1 C) 100.3 F (37.9 C) 103 F (39.4 C) 102.5 F (39.2 C)  TempSrc: Oral     Resp: 19 18 18 18   SpO2: 97% 96% 90% 92%      Intake/Output from previous day: I/O last 3 completed shifts: In: 4610.4 [P.O.:500; I.V.:4110.4] Out: 1805 [Urine:1750; Drains:55]   Intake/Output this shift:     LABORATORY DATA:  Recent Labs  02/06/13 0418  02/06/13 1607 02/06/13 2137 02/07/13 0557 02/07/13 0643  WBC 6.3  --   --   --  7.7  --   HGB 10.8*  --   --   --  9.7*  --   HCT 30.7*  --   --   --  28.8*  --   PLT 170  --   --   --  141*  --   GLUCAP  --   < > 118* 170*  --  190*  < > = values in this interval not displayed.  Examination: Neurologically intact ABD soft Sensation intact distally Intact pulses distally Dorsiflexion/Plantar flexion intact Incision: scant drainage No cellulitis present Compartment soft}  Assessment:   2 Days Post-Op Procedure(s) (LRB): TOTAL KNEE ARTHROPLASTY (Right) ADDITIONAL DIAGNOSIS:  Diabetes, Hypertension and Cardiac Arrythmia LBB and morbid ocesity Fever of unknown origin max of 103.0  Plan: PT/OT WBAT, CPM 5/hrs day until ROM 0-90 degrees, then D/C CPM DVT Prophylaxis:  SCDx72hrs, ASA 325 mg BID x 2 weeks DISCHARGE PLAN: Skilled Nursing Facility/Rehab DISCHARGE NEEDS: HHPT, HHRN, CPM, Walker and 3-in-1 comode seat  U/A, Blood cultures x  2, chest xray.  Consider consult to hospitalists pending results.     PHILLIPS, ERIC R 02/07/2013, 7:21 AM

## 2013-02-07 NOTE — Progress Notes (Signed)
Physical Therapy Treatment Patient Details Name: Rickey Rice MRN: [id number] DOB: [birth year] Today's Date: 02/07/2013 Time: 0900-0930 PT Time Calculation (min): 30 min  PT Assessment / Plan / Recommendation  History of Present Illness RTKA with bad left knee as well, obesity   PT Comments   Pt continues with significant weakness and deconditioning, requiring +2 assist for all mobility.  Pt will benefit from continued acute PT services and SNF for rehab at d/c.  Follow Up Recommendations  SNF     Does the patient have the potential to tolerate intense rehabilitation     Barriers to Discharge        Equipment Recommendations  None recommended by PT    Recommendations for Other Services    Frequency 7X/week   Progress towards PT Goals Progress towards PT goals: Progressing toward goals  Plan Current plan remains appropriate    Precautions / Restrictions Precautions Precautions: Fall Restrictions Weight Bearing Restrictions: Yes Other Position/Activity Restrictions: WBAT   Pertinent Vitals/Pain Pt c/o pain in L knee with wt bearing, eases with rest    Mobility  Bed Mobility Supine to Sit: 3: Mod assist;With rails Sitting - Scoot to Edge of Bed: 3: Mod assist Details for Bed Mobility Assistance: cues for technique, assist with R LE Transfers Sit to Stand: 1: +2 Total assist;From bed;From chair/3-in-1 Stand to Sit: To chair/3-in-1;1: +2 Total assist Details for Transfer Assistance: multiple reps sit to stand to chair, 3 in 1 for toileting and for strengthening.  Pt requires +2 lifting assist, cues for UE placement on armrests Ambulation/Gait Ambulation/Gait Assistance: 1: +2 Total assist Ambulation/Gait: Patient Percentage: 70% Ambulation Distance (Feet): 10 Feet Assistive device: Rolling walker Ambulation/Gait Assistance Details: Pt with audible cracking of L knee joint, limited by pain in L knee greater than surgical pain.   Pt with significant weakness, knees  giving out after 10', need close follow with chair.  Pt requries total A for wt shifting to advance LEs, step to pattern    Exercises     PT Diagnosis:    PT Problem List:   PT Treatment Interventions:     PT Goals (current goals can now be found in the care plan section)    Visit Information  Last PT Received On: 02/07/13 Assistance Needed: +2 History of Present Illness: RTKA with bad left knee as well, obesity    Subjective Data      Cognition  Cognition Arousal/Alertness: Awake/alert Behavior During Therapy: WFL for tasks assessed/performed Overall Cognitive Status: Within Functional Limits for tasks assessed    Balance     End of Session PT - End of Session Equipment Utilized During Treatment: Gait belt Activity Tolerance: Patient limited by fatigue Patient left: in chair;with call bell/phone within reach;with family/visitor present Nurse Communication: Mobility status CPM Right Knee CPM Right Knee: On Right Knee Flexion (Degrees): 60 Right Knee Extension (Degrees): 0   GP     Rickey Rice 02/07/2013, 10:59 AM

## 2013-02-08 LAB — GLUCOSE, CAPILLARY
Glucose-Capillary: 140 mg/dL — ABNORMAL HIGH (ref 70–99)
Glucose-Capillary: 147 mg/dL — ABNORMAL HIGH (ref 70–99)
Glucose-Capillary: 153 mg/dL — ABNORMAL HIGH (ref 70–99)
Glucose-Capillary: 127 mg/dL — ABNORMAL HIGH (ref 70–99)

## 2013-02-08 LAB — CBC
HCT: 28.2 % — ABNORMAL LOW (ref 39.0–52.0)
Hemoglobin: 9.7 g/dL — ABNORMAL LOW (ref 13.0–17.0)
MCH: 31.6 pg (ref 26.0–34.0)
MCV: 91.9 fL (ref 78.0–100.0)
Platelets: 151 10*3/uL (ref 150–400)
RBC: 3.07 MIL/uL — ABNORMAL LOW (ref 4.22–5.81)

## 2013-02-08 NOTE — Progress Notes (Signed)
Clinical Child psychotherapist (CSW) met with patient and his wife who was at bedside. Patient has been faxed out and [facility] has extended a bed offer. Wife and patient reported that they choose [facility]. CSW contacted [facility] and made them aware of above and that patient will possibly D/C on Monday. FL2 is signed on shadow chart.   Jetta Lout, LCSWA Weekend CSW [phone]

## 2013-02-08 NOTE — Progress Notes (Signed)
Subjective: 3 Days Post-Op Procedure(s) (LRB): TOTAL KNEE ARTHROPLASTY (Right) Temperature spike last night. Currently afebrile. Working with therapy but slow progress. Skilled nursing facility Monday. Activity level:  Weightbearing as tolerated right Diet tolerance:  Eating okay. Patient has had bowel movement. Voiding:  Voiding Patient reports pain as 3 on 0-10 scale.    Objective: Vital signs in last 24 hours: Temp:  [99.1 F (37.3 C)-102.7 F (39.3 C)] 100 F (37.8 C) (11/15 0500) Pulse Rate:  [91-99] 91 (11/15 0500) Resp:  [16-18] 18 (11/15 0500) BP: (96-131)/(56-93) 131/93 mmHg (11/15 0500) SpO2:  [93 %-99 %] 93 % (11/15 0500) Weight:  [149.687 kg (330 lb)] 149.687 kg (330 lb) (11/14 1541)  Labs:  Recent Labs  02/06/13 0418 02/07/13 0557 02/08/13 0410  HGB 10.8* 9.7* 9.7*    Recent Labs  02/07/13 0557 02/08/13 0410  WBC 7.7 8.2  RBC 3.14* 3.07*  HCT 28.8* 28.2*  PLT 141* 151   No results found for this basename: NA, K, CL, CO2, BUN, CREATININE, GLUCOSE, CALCIUM,  in the last 72 hours No results found for this basename: LABPT, INR,  in the last 72 hours  Physical Exam:  Neurologically intact ABD soft Neurovascular intact Sensation intact distally Intact pulses distally Dorsiflexion/Plantar flexion intact Incision: dressing C/D/I No cellulitis present Compartment soft  Assessment/Plan:  3 Days Post-Op Procedure(s) (LRB): TOTAL KNEE ARTHROPLASTY (Right) Advance diet Up with therapy Discharge to SNF on Monday. Chest x-ray normal, urinalysis normal, blood cultures pending. Mr. Ottaway appears comfortable in bed with no cough or sputum production, no urinary symptoms and minimal knee pain. Negative Homans. Continue ASA 325/SCDs. Will monitor blood cultures. Plan discharge to skilled nursing facility Monday Continue to work with therapy.Marland Kitchen     Ardean Simonich R 02/08/2013, 9:17 AM

## 2013-02-08 NOTE — Progress Notes (Signed)
Physical Therapy Treatment Patient Details Name: Rickey Rice MRN: [id number] DOB: 1940-05-29 Today's Date: 02/08/2013 Time: 5956-3875 PT Time Calculation (min): 28 min  PT Assessment / Plan / Recommendation  History of Present Illness RTKA with bad left knee as well, obesity   PT Comments   Slow, steady progress. Pt still will require SNF for rehab prior ro DC home.   Follow Up Recommendations  SNF     Does the patient have the potential to tolerate intense rehabilitation     Barriers to Discharge        Equipment Recommendations  None recommended by PT    Recommendations for Other Services    Frequency 7X/week   Progress towards PT Goals Progress towards PT goals: Progressing toward goals  Plan Current plan remains appropriate    Precautions / Restrictions Precautions Precautions: Fall Restrictions Weight Bearing Restrictions: Yes RLE Weight Bearing: Weight bearing as tolerated   Pertinent Vitals/Pain C/o mild pain in r knee    Mobility  Bed Mobility Bed Mobility: Not assessed (pt up in chair and requested not to return to bed at this ti) Transfers Transfers: Sit to Stand;Stand to Sit Sit to Stand: 1: +2 Total assist;With upper extremity assist;From chair/3-in-1 Sit to Stand: Patient Percentage: 60% Stand to Sit: 1: +2 Total assist;With upper extremity assist;With armrests;To chair/3-in-1 Stand to Sit: Patient Percentage: 60% Details for Transfer Assistance: cues for hand and RLE placement Ambulation/Gait Ambulation/Gait Assistance: 3: Mod assist Ambulation Distance (Feet): 15 Feet Assistive device: Rolling walker;Other (Comment) (bariatric RW) Ambulation/Gait Assistance Details: fatigues quickly with gait. RW lowered 1 inch and pt felt like it was much easier today Gait Pattern: Step-to pattern;Decreased stride length Gait velocity: slow    Exercises Total Joint Exercises Ankle Circles/Pumps: AROM;Both;5 reps;Seated Quad Sets: AROM;Right;10  reps;Seated Hip ABduction/ADduction: AAROM;Right;10 reps;Seated (long siting in recliner) Long Arc Quad: AAROM;Right;10 reps;Seated Knee Flexion: AAROM;Right;5 reps;Seated Goniometric ROM: 92 degrees flexion in sitting   PT Diagnosis:    PT Problem List:   PT Treatment Interventions:     PT Goals (current goals can now be found in the care plan section)    Visit Information  Last PT Received On: 02/08/13 Assistance Needed: +2 History of Present Illness: RTKA with bad left knee as well, obesity    Subjective Data      Cognition  Cognition Arousal/Alertness: Awake/alert Behavior During Therapy: WFL for tasks assessed/performed Overall Cognitive Status: Within Functional Limits for tasks assessed    Balance     End of Session PT - End of Session Equipment Utilized During Treatment: Gait belt Activity Tolerance: Patient tolerated treatment well (left knee fatigued with gait which required pt to sit) Patient left: in chair;with call bell/phone within reach;with family/visitor present Nurse Communication: Mobility status   GP     Donnella Sham 02/08/2013, 1:49 PM [place]ona Mound, PT  [phone] 02/08/2013

## 2013-02-09 LAB — GLUCOSE, CAPILLARY: Glucose-Capillary: 108 mg/dL — ABNORMAL HIGH (ref 70–99)

## 2013-02-09 NOTE — Progress Notes (Signed)
Subjective: 4 Days Post-Op Procedure(s) (LRB): TOTAL KNEE ARTHROPLASTY (Right) Working well with therapy. Skilled nursing facility tomorrow. No cough, urinary symptoms, blood cultures negative to this point. Activity level:  Working well with therapy weightbearing as tolerated. Diet tolerance:  Eating Voiding:   okay okay Patient reports pain as 2 on 0-10 scale.    Objective: Vital signs in last 24 hours: Temp:  [98.8 F (37.1 C)-100.8 F (38.2 C)] 100.3 F (37.9 C) (11/16 0549) Pulse Rate:  [88-99] 88 (11/16 0549) Resp:  [16-18] 16 (11/16 0549) BP: (101-114)/(49-60) 101/49 mmHg (11/16 0549) SpO2:  [96 %-100 %] 97 % (11/16 0549)  Labs:  Recent Labs  02/07/13 0557 02/08/13 0410  HGB 9.7* 9.7*    Recent Labs  02/07/13 0557 02/08/13 0410  WBC 7.7 8.2  RBC 3.14* 3.07*  HCT 28.8* 28.2*  PLT 141* 151   No results found for this basename: NA, K, CL, CO2, BUN, CREATININE, GLUCOSE, CALCIUM,  in the last 72 hours No results found for this basename: LABPT, INR,  in the last 72 hours  Physical Exam:  Neurologically intact ABD soft Neurovascular intact Sensation intact distally Intact pulses distally Dorsiflexion/Plantar flexion intact Incision: dressing C/D/I No cellulitis present Compartment soft Dressing changed yesterday wound normal. Calf soft nontender negative Homans.  Assessment/Plan:  4 Days Post-Op Procedure(s) (LRB): TOTAL KNEE ARTHROPLASTY (Right) Advance diet Up with therapy Discharge to SNF on Monday. Cultures negative so far, no urinary or respiratory symptoms. Patient feeling much better. Temperature spike last night 100.8 normal temperature now. Continue ASA 325/SCDs. Continue working with physical therapy. Passing gas and has had bm.    Naveen Lorusso R 02/09/2013, 10:20 AM

## 2013-02-09 NOTE — Progress Notes (Signed)
Physical Therapy Treatment Patient Details Name: Rickey Rice MRN: [id number] DOB: June 21, 1940 Today's Date: 02/09/2013 Time: 0917-0950 PT Time Calculation (min): 33 min  PT Assessment / Plan / Recommendation  History of Present Illness RTKA with bad left knee as well, obesity   PT Comments   Improved activity tolerance this am .  Pt. And wife pleased with his effort in PT today.  Continue with POC tomorrow and anticipate transfer to [facility] for continued rehab to reach max functional level.  Follow Up Recommendations  SNF     Does the patient have the potential to tolerate intense rehabilitation     Barriers to Discharge        Equipment Recommendations  None recommended by PT    Recommendations for Other Services    Frequency 7X/week   Progress towards PT Goals Progress towards PT goals: Progressing toward goals  Plan Current plan remains appropriate    Precautions / Restrictions Precautions Precautions: Fall Restrictions Weight Bearing Restrictions: Yes RLE Weight Bearing: Weight bearing as tolerated   Pertinent Vitals/Pain See vitals tab Pain did not interfere much with mobility and exercises    Mobility  Bed Mobility Bed Mobility: Not assessed (pt. seated at edge of bed with wife's assist earlier) Transfers Transfers: Sit to Stand;Stand to Sit Sit to Stand: 1: +2 Total assist;With upper extremity assist;From bed Sit to Stand: Patient Percentage: 70% Stand to Sit: 1: +2 Total assist;With upper extremity assist;With armrests;To chair/3-in-1 Stand to Sit: Patient Percentage: 60% Details for Transfer Assistance: cues for hand and RLE placement Ambulation/Gait Ambulation/Gait Assistance: 3: Mod assist (second person to bring recliner for safety) Ambulation Distance (Feet): 50 Feet Assistive device: Rolling walker;Other (Comment) (bariatric RW) Ambulation/Gait Assistance Details: Pt. able to increase distance to 50 today with no seated or standing rest needed.   Good technique and sequencing with RW.   Gait Pattern: Step-to pattern;Decreased stride length Gait velocity: slow General Gait Details: + 2 assist for safety and will need to bring chair behind.    Exercises Total Joint Exercises Ankle Circles/Pumps: AROM;Both;10 reps;Seated Quad Sets: AROM;Right;10 reps;Seated Short Arc Quad: AAROM;Right;Seated Hip ABduction/ADduction: AAROM;Right;10 reps;Seated (long sitting in recliner) Straight Leg Raises: AAROM;Strengthening;Both;5 reps;Supine Long Arc Quad: AROM;Right;10 reps;Seated Knee Flexion: AAROM;Right;5 reps;Seated Goniometric ROM: 96 degrees flexion in sitting   PT Diagnosis:    PT Problem List:   PT Treatment Interventions:     PT Goals (current goals can now be found in the care plan section) Acute Rehab PT Goals Patient Stated Goal: To go to [employer]  Visit Information  Last PT Received On: 02/09/13 Assistance Needed: +2 History of Present Illness: RTKA with bad left knee as well, obesity    Subjective Data  Subjective: "I am feeling better today" Patient Stated Goal: To go to [employer]  Cognition Arousal/Alertness: Awake/alert Behavior During Therapy: WFL for tasks assessed/performed Overall Cognitive Status: Within Functional Limits for tasks assessed    Balance     End of Session PT - End of Session Equipment Utilized During Treatment: Gait belt Activity Tolerance: Patient tolerated treatment well Patient left: in chair;with call bell/phone within reach;with family/visitor present Nurse Communication: Mobility status   GP     Ferman Hamming 02/09/2013, 10:58 AM Weldon Picking PT Acute Rehab Services [phone] Beeper [phone]

## 2013-02-10 ENCOUNTER — Encounter (HOSPITAL_COMMUNITY): Payer: Self-pay | Admitting: Orthopedic Surgery

## 2013-02-10 LAB — GLUCOSE, CAPILLARY: Glucose-Capillary: 146 mg/dL — ABNORMAL HIGH (ref 70–99)

## 2013-02-10 NOTE — Progress Notes (Signed)
Physical Therapy Treatment Patient Details Name: Rickey Rice MRN: [id number] DOB: 1941-01-15 Today's Date: 02/10/2013 Time: 1191-4782 PT Time Calculation (min): 32 min  PT Assessment / Plan / Recommendation  History of Present Illness RTKA with bad left knee as well, obesity   PT Comments   Pt progressing towards physical therapy goals, and showing improvement with gait training and transfers. Pt is safe to ambulate without chair follow for future sessions, and rehab effort was good today to increase ambulation distance. Pt and wife report d/c to SNF this afternoon.  Follow Up Recommendations  SNF     Does the patient have the potential to tolerate intense rehabilitation     Barriers to Discharge        Equipment Recommendations  None recommended by PT    Recommendations for Other Services    Frequency 7X/week   Progress towards PT Goals Progress towards PT goals: Progressing toward goals  Plan Current plan remains appropriate    Precautions / Restrictions Precautions Precautions: Fall;Knee Restrictions Weight Bearing Restrictions: Yes RLE Weight Bearing: Weight bearing as tolerated   Pertinent Vitals/Pain 7/10 after session. Pt reports receiving pain medication prior to session beginning.    Mobility  Bed Mobility Bed Mobility: Not assessed Details for Bed Mobility Assistance: Pt sitting up in recliner when PT entered Transfers Transfers: Sit to Stand;Stand to Sit Sit to Stand: 4: Min assist Stand to Sit: 4: Min assist Details for Transfer Assistance: Assist for controlled descent to chair. VC's for hand placement. Ambulation/Gait Ambulation/Gait Assistance: 4: Min guard Ambulation Distance (Feet): 100 Feet Assistive device: Rolling walker Ambulation/Gait Assistance Details: Chair follow for safety. Pt was cued for increased heel strike, and encouraged step-through gait pattern.  Gait Pattern: Step-to pattern;Step-through pattern;Decreased stride length Gait  velocity: Decreased    Exercises Total Joint Exercises Ankle Circles/Pumps: 10 reps Quad Sets: 10 reps Heel Slides: 10 reps Goniometric ROM: 3-95   PT Diagnosis:    PT Problem List:   PT Treatment Interventions:     PT Goals (current goals can now be found in the care plan section) Acute Rehab PT Goals Patient Stated Goal: To return home with wife after STR PT Goal Formulation: With patient Time For Goal Achievement: 02/13/13 Potential to Achieve Goals: Good  Visit Information  Last PT Received On: 02/10/13 Assistance Needed: +1 History of Present Illness: RTKA with bad left knee as well, obesity    Subjective Data  Subjective: "I want you to look at my foot - it is swollen." Patient Stated Goal: To return home with wife after STR   Cognition  Cognition Arousal/Alertness: Awake/alert Behavior During Therapy: WFL for tasks assessed/performed Overall Cognitive Status: Within Functional Limits for tasks assessed    Balance  Balance Balance Assessed: Yes  End of Session PT - End of Session Equipment Utilized During Treatment: Gait belt Activity Tolerance: Patient tolerated treatment well Patient left: in chair;with call bell/phone within reach;with family/visitor present Nurse Communication: Mobility status   GP     Ruthann Cancer 02/10/2013, 12:28 PM  Ruthann Cancer, PT, DPT [phone]

## 2013-02-10 NOTE — Progress Notes (Signed)
CSW contacted [facility] for transport.   CSW sighning Off.    Leron Croak [facility]  [address];  [phone] Phone: [phone]

## 2013-02-10 NOTE — Progress Notes (Signed)
Pt completed all paperwork for d/c to [facility] and Pt is ready for transport.   Nurse and CM notified.   Pt plan is to transport to facility via [facility].   Pt and family are agreeable to plan.    Leron Croak [facility]  [address]N 1[address];  [phone] Phone: [phone]

## 2013-02-10 NOTE — Progress Notes (Signed)
Patient ID: Rickey Rice, male   DOB: Aug 08, 1940, 72 y.o.   MRN: [id number] PATIENT ID: Rickey Rice  MRN: [id number]  DOB/AGE:  1940-06-12 / 72 y.o.  5 Days Post-Op Procedure(s) (LRB): TOTAL KNEE ARTHROPLASTY (Right)    PROGRESS NOTE Subjective: Patient is alert, oriented, no Nausea, no Vomiting, yes passing gas, yes Bowel Movement. Taking PO well. Denies SOB, Chest or Calf Pain. Using Incentive Spirometer, PAS in place. Ambulate WBAT, CPM 96deg Patient reports pain as 2 on 0-10 scale  .    Objective: Vital signs in last 24 hours: Filed Vitals:   02/09/13 1500 02/09/13 1600 02/09/13 2114 02/10/13 0626  BP: 117/57  135/67 130/67  Pulse: 97  97 104  Temp: 99.8 F (37.7 C)  100.7 F (38.2 C) 99.2 F (37.3 C)  TempSrc: Oral  Oral   Resp: 18 16 18 18   Height:      Weight:      SpO2: 100%  96% 98%      Intake/Output from previous day: I/O last 3 completed shifts: In: 1680 [P.O.:1680] Out: 1150 [Urine:1150]   Intake/Output this shift:     LABORATORY DATA:  Recent Labs  02/08/13 0410  02/09/13 1132 02/09/13 1800 02/09/13 2129  WBC 8.2  --   --   --   --   HGB 9.7*  --   --   --   --   HCT 28.2*  --   --   --   --   PLT 151  --   --   --   --   GLUCAP  --   < > 142* 114* 113*  < > = values in this interval not displayed.  Examination: Neurologically intact ABD soft Neurovascular intact Sensation intact distally Intact pulses distally Dorsiflexion/Plantar flexion intact Incision: no drainage No cellulitis present Compartment soft}  Assessment:   5 Days Post-Op Procedure(s) (LRB): TOTAL KNEE ARTHROPLASTY (Right) ADDITIONAL DIAGNOSIS:  Diabetes and Hypertension  Plan: PT/OT WBAT, CPM 5/hrs day until ROM 0-90 degrees, then D/C CPM DVT Prophylaxis:  SCDx72hrs, ASA 325 mg BID x 2 weeks DISCHARGE PLAN: Skilled Nursing Facility/Rehab, [facility] today DISCHARGE NEEDS: HHPT, HHRN, CPM, Walker and 3-in-1 comode seat     Rickey Rice J 02/10/2013, 7:28  AM

## 2013-02-10 NOTE — Discharge Summary (Signed)
Patient ID: Rickey Rice MRN: [id number] DOB/AGE: [birth year] 72 y.o.  Admit date: 02/05/2013 Discharge date: 02/10/2013  Admission Diagnoses:  Principal Problem:   Osteoarthritis of right knee   Discharge Diagnoses:  Same  Past Medical History  Diagnosis Date  . Diabetes mellitus without complication     type 2  . Hypertension   . Hypothyroidism   . Morbid obesity   . Dyslipidemia   . LBBB (left bundle branch block)   . History of nuclear stress test 04/2012    lexiscan - 2 day protocol; low risk study, evidence of inferrior & apical scar but no ischemia   . Arthritis     Surgeries: Procedure(s): TOTAL KNEE ARTHROPLASTY on 02/05/2013   Consultants:    Discharged Condition: Improved  Hospital Course: Rickey Rice is an 72 y.o. male who was admitted 02/05/2013 for operative treatment ofOsteoarthritis of right knee. Patient has severe unremitting pain that affects sleep, daily activities, and work/hobbies. After pre-op clearance the patient was taken to the operating room on 02/05/2013 and underwent  Procedure(s): TOTAL KNEE ARTHROPLASTY.    Patient was given perioperative antibiotics: Anti-infectives   Start     Dose/Rate Route Frequency Ordered Stop   02/05/13 1345  cefUROXime (ZINACEF) injection  Status:  Discontinued       As needed 02/05/13 1345 02/05/13 1524   02/05/13 0600  ceFAZolin (ANCEF) 3 g in dextrose 5 % 50 mL IVPB     3 g 160 mL/hr over 30 Minutes Intravenous On call to O.R. 02/04/13 1444 02/05/13 1315       Patient was given sequential compression devices, early ambulation, and chemoprophylaxis to prevent DVT. On the evening of surgery had temperature up to 103, chest x-ray, blood cultures, urine cultures were negative and he responded well to incentive spirometry and physical therapy  Patient benefited maximally from hospital stay and there were no complications.    Recent vital signs: Patient Vitals for the past 24 hrs:  BP Temp Temp src  Pulse Resp SpO2  02/10/13 0626 130/67 mmHg 99.2 F (37.3 C) - 104 18 98 %  02/09/13 2114 135/67 mmHg 100.7 F (38.2 C) Oral 97 18 96 %  02/09/13 1600 - - - - 16 -  02/09/13 1500 117/57 mmHg 99.8 F (37.7 C) Oral 97 18 100 %  02/09/13 1200 - - - - 16 -  02/09/13 0800 - - - - 16 -     Recent laboratory studies:  Recent Labs  02/08/13 0410  WBC 8.2  HGB 9.7*  HCT 28.2*  PLT 151     Discharge Medications:     Medication List    STOP taking these medications       HYDROcodone-acetaminophen 5-325 MG per tablet  Commonly known as:  NORCO/VICODIN      TAKE these medications       amLODipine 5 MG tablet  Commonly known as:  NORVASC  Take 5 mg by mouth daily.     aspirin EC 325 MG tablet  Take 1 tablet (325 mg total) by mouth 2 (two) times daily.     celecoxib 200 MG capsule  Commonly known as:  CELEBREX  Take 200 mg by mouth daily.     glimepiride 4 MG tablet  Commonly known as:  AMARYL  Take 8 mg by mouth daily before breakfast.     levothyroxine 75 MCG tablet  Commonly known as:  SYNTHROID, LEVOTHROID  Take 75 mcg by mouth daily before breakfast.  LUTEIN ESTERS PO  Take 1 capsule by mouth daily.     metFORMIN 1000 MG tablet  Commonly known as:  GLUCOPHAGE  Take 1,000 mg by mouth daily with breakfast.     methocarbamol 500 MG tablet  Commonly known as:  ROBAXIN  Take 1 tablet (500 mg total) by mouth 2 (two) times daily with a meal.     ONGLYZA 5 MG Tabs tablet  Generic drug:  saxagliptin HCl  Take 5 mg by mouth daily.     oxyCODONE-acetaminophen 5-325 MG per tablet  Commonly known as:  ROXICET  Take 1 tablet by mouth every 4 (four) hours as needed.     rosuvastatin 10 MG tablet  Commonly known as:  CRESTOR  Take 10 mg by mouth daily.     TEKTURNA HCT 300-25 MG Tabs  Generic drug:  Aliskiren-Hydrochlorothiazide  Take 1 tablet by mouth daily.        Diagnostic Studies: Dg Chest 2 View  02/07/2013   CLINICAL DATA:  Shortness of breath and  fever  EXAM: CHEST  2 VIEW  COMPARISON:  01/29/2013  FINDINGS: The cardiac shadow is stable. Elevation of the right hemidiaphragm is again noted. Minimal basilar atelectatic changes are noted bilaterally. No acute bony abnormality is noted.  IMPRESSION: Minimal basilar atelectatic changes. No focal confluent infiltrate is seen.   Electronically Signed   By: Alcide Clever M.D.   On: 02/07/2013 08:29   Dg Chest 2 View  01/29/2013   CLINICAL DATA:  Preop right knee arthroplasty  EXAM: CHEST  2 VIEW  COMPARISON:  DG CHEST 1V PORT dated 09/24/2008  FINDINGS: Normal cardiac silhouette. Low lung volumes similar prior. No effusion, infiltrate, or pneumothorax. Degenerative osteophytosis of the thoracic spine.  IMPRESSION: Low lung volumes. No acute cardiopulmonary process.   Electronically Signed   By: Genevive Bi M.D.   On: 01/29/2013 11:36    Disposition:       Discharge Orders   Future Orders Complete By Expires   Call MD / Call 911  As directed    Comments:     If you experience chest pain or shortness of breath, CALL 911 and be transported to the hospital emergency room.  If you develope a fever above 101 F, pus (white drainage) or increased drainage or redness at the wound, or calf pain, call your surgeon's office.   Change dressing  As directed    Comments:     Change dressing on POD #5.  You may clean the incision with alcohol prior to redressing.   Constipation Prevention  As directed    Comments:     Drink plenty of fluids.  Prune juice may be helpful.  You may use a stool softener, such as Colace (over the counter) 100 mg twice a day.  Use MiraLax (over the counter) for constipation as needed.   CPM  As directed    Comments:     Continuous passive motion machine (CPM):      Use the CPM from 0 to 60 for 5 hours per day.      You may increase by 10 degrees per day.  You may break it up into 2 or 3 sessions per day.      Use CPM for 2 weeks or until you are told to stop.   Diet - low  sodium heart healthy  As directed    Do not put a pillow under the knee. Place it under the heel.  As  directed    Driving restrictions  As directed    Comments:     No driving for 2 weeks   Increase activity slowly as tolerated  As directed    Patient may shower  As directed    Comments:     You may shower without a dressing once there is no drainage.  Do not wash over the wound.  If drainage remains, cover wound with plastic wrap and then shower.      Follow-up Information   Follow up with Nestor Lewandowsky, MD In 9 days.   Specialty:  Orthopedic Surgery   Contact information:   [address] [place] [address] [phone]        Signed: Nestor Lewandowsky 02/10/2013, 7:34 AM

## 2013-02-10 NOTE — Progress Notes (Signed)
CSW spoke with Jasmine December at [facility] and Pt will need to go into a semi-private room. Pt is aware and agreeable.   CSW to follow for d/c planning.   Leron Croak [facility]  [address]N 1[address]16; [phone]  Phone: [phone]

## 2013-02-12 ENCOUNTER — Non-Acute Institutional Stay (SKILLED_NURSING_FACILITY): Payer: Medicare Other | Admitting: Internal Medicine

## 2013-02-12 DIAGNOSIS — E039 Hypothyroidism, unspecified: Secondary | ICD-10-CM

## 2013-02-12 DIAGNOSIS — M1710 Unilateral primary osteoarthritis, unspecified knee: Secondary | ICD-10-CM

## 2013-02-12 DIAGNOSIS — M1711 Unilateral primary osteoarthritis, right knee: Secondary | ICD-10-CM

## 2013-02-12 DIAGNOSIS — E119 Type 2 diabetes mellitus without complications: Secondary | ICD-10-CM

## 2013-02-12 DIAGNOSIS — K5900 Constipation, unspecified: Secondary | ICD-10-CM

## 2013-02-13 LAB — CULTURE, BLOOD (ROUTINE X 2)
Culture: NO GROWTH
Culture: NO GROWTH

## 2013-02-16 ENCOUNTER — Other Ambulatory Visit: Payer: Self-pay

## 2013-02-16 LAB — CBC WITH DIFFERENTIAL/PLATELET
Basophil %: 0.4 %
Eosinophil %: 3.6 %
Lymphocyte #: 1.5 10*3/uL (ref 1.0–3.6)
Lymphocyte %: 19 %
MCHC: 34.3 g/dL (ref 32.0–36.0)
MCV: 93 fL (ref 80–100)
Monocyte #: 1.1 x10 3/mm — ABNORMAL HIGH (ref 0.2–1.0)
Neutrophil %: 62.6 %
Platelet: 311 10*3/uL (ref 150–440)
RBC: 2.7 10*6/uL — ABNORMAL LOW (ref 4.40–5.90)
RDW: 14.1 % (ref 11.5–14.5)

## 2013-02-16 LAB — BASIC METABOLIC PANEL
Anion Gap: 4 — ABNORMAL LOW (ref 7–16)
BUN: 28 mg/dL — ABNORMAL HIGH (ref 7–18)
Chloride: 102 mmol/L (ref 98–107)
Co2: 33 mmol/L — ABNORMAL HIGH (ref 21–32)
Creatinine: 1.49 mg/dL — ABNORMAL HIGH (ref 0.60–1.30)
EGFR (African American): 54 — ABNORMAL LOW
EGFR (Non-African Amer.): 46 — ABNORMAL LOW
Potassium: 4.4 mmol/L (ref 3.5–5.1)
Sodium: 139 mmol/L (ref 136–145)

## 2013-02-18 ENCOUNTER — Other Ambulatory Visit: Payer: Self-pay

## 2013-02-18 MED ORDER — OXYCODONE HCL 5 MG PO TABS: 5.0000 mg | ORAL_TABLET | Freq: Three times a day (TID) | ORAL | Status: DC

## 2013-02-21 LAB — CULTURE, BLOOD (SINGLE)

## 2013-03-12 ENCOUNTER — Ambulatory Visit (HOSPITAL_COMMUNITY)
Admission: RE | Admit: 2013-03-12 | Discharge: 2013-03-12 | Disposition: A | Discharge status: Home / Self Care | Payer: Medicare Other | Source: Ambulatory Visit | Attending: Orthopedic Surgery | Admitting: Orthopedic Surgery

## 2013-03-12 DIAGNOSIS — IMO0001 Reserved for inherently not codable concepts without codable children: Secondary | ICD-10-CM | POA: Insufficient documentation

## 2013-03-12 DIAGNOSIS — R262 Difficulty in walking, not elsewhere classified: Secondary | ICD-10-CM | POA: Insufficient documentation

## 2013-03-12 DIAGNOSIS — M25569 Pain in unspecified knee: Secondary | ICD-10-CM | POA: Insufficient documentation

## 2013-03-12 DIAGNOSIS — M25661 Stiffness of right knee, not elsewhere classified: Secondary | ICD-10-CM | POA: Insufficient documentation

## 2013-03-12 DIAGNOSIS — I10 Essential (primary) hypertension: Secondary | ICD-10-CM | POA: Insufficient documentation

## 2013-03-12 DIAGNOSIS — M25669 Stiffness of unspecified knee, not elsewhere classified: Secondary | ICD-10-CM | POA: Insufficient documentation

## 2013-03-12 DIAGNOSIS — R29898 Other symptoms and signs involving the musculoskeletal system: Secondary | ICD-10-CM | POA: Insufficient documentation

## 2013-03-12 NOTE — Evaluation (Signed)
Physical Therapy Evaluation  Patient Details  Name: Rickey Rice MRN: [id number] Date of Birth: [birth year]  Today's Date: 03/12/2013 Time: 1435-1510 PT Time Calculation (min): 35 min Charges: 1 evalatuion             Visit#: 1 of 10  Re-eval: 04/11/13 Assessment Diagnosis: Rt TKR Surgical Date: 02/05/13 Next MD Visit: Dr. Turner Daniels - 03/18/13  Authorization: [facility] Medicare    Authorization Time Period:    Authorization Visit#: 1 of 10   Past Medical History:  Past Medical History  Diagnosis Date  . Diabetes mellitus without complication     type 2  . Hypertension   . Hypothyroidism   . Morbid obesity   . Dyslipidemia   . LBBB (left bundle branch block)   . History of nuclear stress test 04/2012    lexiscan - 2 day protocol; low risk study, evidence of inferrior & apical scar but no ischemia   . Arthritis    Past Surgical History:  Past Surgical History  Procedure Laterality Date  . Mouth surgery  1963  . Finger surgery  1954  . Colonoscopy w/ biopsies    . Knee arthroscopy Left   . Total knee arthroplasty Right 02/05/2013    Dr Turner Daniels  . Total knee arthroplasty Right 02/05/2013    Procedure: TOTAL KNEE ARTHROPLASTY;  Surgeon: Nestor Lewandowsky, MD;  Location: [facility] OR;  Service: Orthopedics;  Laterality: Right;    Subjective Symptoms/Limitations Pertinent History: Pt is a 72 year old male referred to PT s/p Rt TKR on 02/05/13.  He has recieved SNF and HHPT (4 visits).  he reports that he feels he needs strengthening for his legs.  Prior to the TKR his legs were very unstable.  He has a hx of Lt knee scope and recieved injections for his knees.  He states that he is having difficulty going from sit to stand for a standard surface.  He is currently doing exercises at home (mini squats, heel/toe raises, knee flexion/extension with theraband, standing hip exercises).   Limitations: Standing;Walking;House hold activities How long can you stand comfortably?: 15-20 minutes How long  can you walk comfortably?: w/rollator or SPC x5 minutes, uses a cart at [employer].  Patient Stated Goals: get strength back and feel comfortable to drive again. Pain Assessment Currently in Pain?: Yes Pain Location: Knee Pain Orientation: Right Pain Type: Surgical pain;Acute pain Pain Relieving Factors: OTC medication 1x/day Effect of Pain on Daily Activities: walking with AD  Balance Screening Balance Screen Has the patient fallen in the past 6 months: No Has the patient had a decrease in activity level because of a fear of falling? : Yes Is the patient reluctant to leave their home because of a fear of falling? : No  Prior Functioning  Prior Function Level of Independence: Independent with basic ADLs  Able to Take Stairs?: Yes Driving: Yes Vocation: Full time employment Vocation Requirements: Architect  Sensation/Coordination/Flexibility/Functional Tests Functional Tests Functional Tests: FOTO: 55/45  Assessment RLE AROM (degrees) Right Knee Extension: 0 Right Knee Flexion: 108 RLE Strength Right Hip Flexion: 4/5 Right Hip Extension: 3-/5 (taken in S/L) Right Hip ABduction: 3/5 Right Hip ADduction: 3+/5 Right Knee Flexion: 3+/5 Right Knee Extension: 3+/5 Palpation Palpation: moderate fascial restrictions over Rt knee insicion with pain and tenderness thorughout Lt anterior, lateral and posterior knee  Mobility/Balance  Ambulation/Gait Ambulation/Gait: Yes Assistive device: Rollator Gait Pattern: Trunk flexed;Decreased hip/knee flexion - right;Left foot flat;Right foot flat   Exercise/Treatments Supine  Bridges: 10 reps;Limitations Bridges Limitations: 5 sec holds Straight Leg Raises: 10 reps;Limitations Straight Leg Raises Limitations: floating Sidelying Hip ABduction: 10 reps;Limitations Hip ABduction Limitations: PT faciliation Other Sidelying Knee Exercises: hip extension with PT facilation x10 reps  Physical Therapy Assessment and  Plan PT Assessment and Plan Clinical Impression Statement: Pt is a 72 year old male referred to PT s/p Rt TKR with impairments listed below.  At this time pt is most limited by his pain and OA to his Lt knee and bilateral LE atrophy and strength.   Pt will benefit from skilled therapeutic intervention in order to improve on the following deficits: Difficulty walking;Impaired tone;Decreased strength;Impaired perceived functional ability;Decreased range of motion;Decreased activity tolerance Rehab Potential: Good PT Frequency: Min 3X/week PT Duration: 4 weeks PT Treatment/Interventions: Gait training;Stair training;Functional mobility training;Therapeutic activities;Therapeutic exercise;Balance training;Neuromuscular re-education;Patient/family education;Manual techniques PT Plan: Continue with strengtheing LE: NuStep, functional squats, heel/toe raises, stair training.  Prone exercises, gluteal strengthening to progress to sit to stand    Goals Home Exercise Program Pt/caregiver will Perform Home Exercise Program: Independently PT Goal: Perform Home Exercise Program - Progress: Goal set today PT Short Term Goals Time to Complete Short Term Goals: 2 weeks PT Short Term Goal 1: Pt will improve BLE strength by 1/2 muscle grade in order to begin ambulating with a SPC indoors x10 minutes to improve community mobility.  PT Short Term Goal 2: Pt will improve his functional strength in order to go from sit to stand from a 20 inch surface without UE A in order to comfortably get up from chairs at church.   PT Long Term Goals Time to Complete Long Term Goals: 4 weeks PT Long Term Goal 1: Pt will improve his dynamic balance and demonstrate mod I gait with SPC in community x15 minutes in order to go shopping with his wife with greater ease.  PT Long Term Goal 2: Pt will improve his LE strength to [facility] in order to begin driving again to improve independence.  Long Term Goal 3: Pt will improve his LE strength  to [facility] in order to demonstrate mild gait impairments to decrease risk of secondary injury.  Long Term Goal 4: Pt will improve his FOTO to limiation less than 37% for improved QOL.   Problem List Patient Active Problem List   Diagnosis Date Noted  . Difficulty in walking(719.7) 03/12/2013  . Stiffness of right knee 03/12/2013  . Right leg weakness 03/12/2013  . Osteoarthritis of right knee 02/06/2013  . LBBB (left bundle branch block) 01/07/2013  . Preoperative cardiovascular examination 01/07/2013  . DM2 (diabetes mellitus, type 2) 01/07/2013  . Hypothyroidism 01/07/2013  . Morbid obesity 01/07/2013  . HTN (hypertension) 01/07/2013  . Dyslipidemia 01/07/2013    PT - End of Session Activity Tolerance: Patient tolerated treatment well PT Plan of Care PT Home Exercise Plan: Given PT Patient Instructions: importance of HEP.  Consulted and Agree with Plan of Care: Patient;Family member/caregiver Family Member Consulted: Wife  GP Functional Limitation: Mobility: Walking and moving around Mobility: Walking and Moving Around Current Status [phone]): At least 40 percent but less than 60 percent impaired, limited or restricted Mobility: Walking and Moving Around Goal Status [phone]): At least 20 percent but less than 40 percent impaired, limited or restricted  Wandra Babin, MPT, ATC 03/12/2013, 4:15 PM  Physician Documentation Your signature is required to indicate approval of the treatment plan as stated above.  Please sign and either send electronically or make a copy of this  report for your files and return this physician signed original.   Please mark one 1.__approve of plan  2. ___approve of plan with the following conditions.   ______________________________                                                          _____________________ Physician Signature                                                                                                             Date

## 2013-03-18 ENCOUNTER — Ambulatory Visit (HOSPITAL_COMMUNITY)
Admission: RE | Admit: 2013-03-18 | Discharge: 2013-03-18 | Disposition: A | Discharge status: Home / Self Care | Payer: Medicare Other | Source: Ambulatory Visit | Attending: Family Medicine | Admitting: Family Medicine

## 2013-03-18 NOTE — Progress Notes (Signed)
Physical Therapy Treatment Patient Details  Name: Rickey Rice MRN: [id number] Date of Birth: 12-Mar-1941  Today's Date: 03/18/2013 Time: 1345-1440 PT Time Calculation (min): 55 min Charges: Therex x 40'(9811-9147) Manual x 10'(1417-1527) Ice x 10'(1530-1540)  Visit#: 2 of 10  Re-eval: 04/11/13  Authorization: [employer] Medicare  Authorization Visit#: 2 of 10   Subjective: Symptoms/Limitations Symptoms: Pt reports HEP compliance. Pain Assessment Currently in Pain?: No/denies  Exercise/Treatments Standing Heel Raises: 10 reps;Limitations Heel Raises Limitations: toe raises x 10 Knee Flexion: 10 reps;Right Lateral Step Up: 10 reps;Step Height: 4";Hand Hold: 2;Right Forward Step Up: 10 reps;Step Height: 6";Hand Hold: 1;Right Supine Short Arc Quad Sets: 10 reps;Right Bridges: 10 reps Straight Leg Raises: 10 reps;Limitations Sidelying Hip ABduction: 10 reps Prone  Hamstring Curl: 10 reps Hip Extension: 10 reps   Modalities Modalities: Cryotherapy Manual Therapy Manual Therapy: Edema management Edema Management: Retro massage to RLE to decrease edema Cryotherapy Number Minutes Cryotherapy: 10 Minutes Cryotherapy Location: Knee (Right knee) Type of Cryotherapy: Ice pack  Physical Therapy Assessment and Plan PT Assessment and Plan Clinical Impression Statement: Progressed strengthening and stretching exercises to improve functional strength and motion. Pt requires multimodal cueing to improve distal quad facilitation. Manual techniques completed to reduce edema in RLE. Ice applied at end of session to limit pain and inflammation. Pt will benefit from skilled therapeutic intervention in order to improve on the following deficits: Difficulty walking;Impaired tone;Decreased strength;Impaired perceived functional ability;Decreased range of motion;Decreased activity tolerance Rehab Potential: Good PT Frequency: Min 3X/week PT Duration: 4 weeks PT Treatment/Interventions: Gait  training;Stair training;Functional mobility training;Therapeutic activities;Therapeutic exercise;Balance training;Neuromuscular re-education;Patient/family education;Manual techniques PT Plan: Continue to progress strength and ROM per PT POC.     Problem List Patient Active Problem List   Diagnosis Date Noted  . Difficulty in walking(719.7) 03/12/2013  . Stiffness of right knee 03/12/2013  . Right leg weakness 03/12/2013  . Osteoarthritis of right knee 02/06/2013  . LBBB (left bundle branch block) 01/07/2013  . Preoperative cardiovascular examination 01/07/2013  . DM2 (diabetes mellitus, type 2) 01/07/2013  . Hypothyroidism 01/07/2013  . Morbid obesity 01/07/2013  . HTN (hypertension) 01/07/2013  . Dyslipidemia 01/07/2013    PT - End of Session Activity Tolerance: Patient tolerated treatment well General Behavior During Therapy: [facility] for tasks assessed/performed  Seth Bake, PTA  03/18/2013, 3:29 PM

## 2013-03-19 ENCOUNTER — Ambulatory Visit (HOSPITAL_COMMUNITY)
Admission: RE | Admit: 2013-03-19 | Discharge: 2013-03-19 | Disposition: A | Discharge status: Home / Self Care | Payer: Medicare Other | Source: Ambulatory Visit | Attending: Family Medicine | Admitting: Family Medicine

## 2013-03-19 DIAGNOSIS — R29898 Other symptoms and signs involving the musculoskeletal system: Secondary | ICD-10-CM

## 2013-03-19 DIAGNOSIS — M25661 Stiffness of right knee, not elsewhere classified: Secondary | ICD-10-CM

## 2013-03-19 DIAGNOSIS — R262 Difficulty in walking, not elsewhere classified: Secondary | ICD-10-CM

## 2013-03-19 NOTE — Progress Notes (Signed)
Physical Therapy Treatment Patient Details  Name: Rickey Rice MRN: [id number] Date of Birth: 19-Feb-1941  Today's Date: 03/19/2013 Time: 4696-2952 PT Time Calculation (min): 52 min Charge: TE 8413-2440, Manual [phone], Ice 6440-3474  Visit#: 3 of 10  Re-eval: 04/11/13 Assessment Diagnosis: Rt TKR Surgical Date: 02/05/13 Next MD Visit: Dr. Turner Daniels - 03/18/13  Authorization: [facility] Medicare  Authorization Time Period:    Authorization Visit#: 3 of 10   Subjective: Symptoms/Limitations Symptoms: Pt reported he didn't take pain meds today, feels mainly tight today, no real pain Pain Assessment Currently in Pain?: Yes Pain Score: 1  Pain Location: Knee Pain Orientation: Right  Objective:   Exercise/Treatments Stretches Gastroc Stretch: 2 reps;30 seconds;Limitations Gastroc Stretch Limitations: slant board Standing Heel Raises: 15 reps;Limitations Heel Raises Limitations: toe raises x 15 Knee Flexion: 10 reps;Right Lateral Step Up: 10 reps;Step Height: 4";Hand Hold: 2;Right Forward Step Up: 10 reps;Step Height: 6";Hand Hold: 1;Right Functional Squat: 15 reps Rocker Board: 2 minutes;Limitations Rocker Board Limitations: R/L with HHA Supine Bridges: 20 reps;Limitations Bridges Limitations: 5 sec holds Sidelying Hip ABduction: 10 reps Prone  Hamstring Curl: 10 reps;Limitations Hamstring Curl Limitations: 3#   Modalities Modalities: Cryotherapy Manual Therapy Manual Therapy: Edema management Edema Management: Retro massage to RLE to decrease edema Cryotherapy Number Minutes Cryotherapy: 10 Minutes Cryotherapy Location: Knee Type of Cryotherapy: Ice pack  Physical Therapy Assessment and Plan PT Assessment and Plan Clinical Impression Statement: Progressed standing strengthening exercises to improve weight distribution with gait and added stetches to improve flexibility.  Pt required multimodal cueing to improve form and technique with lateral step ups for quad  strengthening.  Manual techniques complete to reduce edema in Rt LE,, ended session with ice for pain and edema control. PT Plan: Continue to progress strength and ROM per PT POC.  Begom bicycle next session for ROM.    Goals Home Exercise Program Pt/caregiver will Perform Home Exercise Program: Independently PT Short Term Goals Time to Complete Short Term Goals: 2 weeks PT Short Term Goal 1: Pt will improve BLE strength by 1/2 muscle grade in order to begin ambulating with a SPC indoors x10 minutes to improve community mobility.  PT Short Term Goal 1 - Progress: Progressing toward goal PT Short Term Goal 2: Pt will improve his functional strength in order to go from sit to stand from a 20 inch surface without UE A in order to comfortably get up from chairs at church.   PT Short Term Goal 2 - Progress: Progressing toward goal PT Long Term Goals Time to Complete Long Term Goals: 4 weeks PT Long Term Goal 1: Pt will improve his dynamic balance and demonstrate mod I gait with SPC in community x15 minutes in order to go shopping with his wife with greater ease.  PT Long Term Goal 2: Pt will improve his LE strength to [facility] in order to begin driving again to improve independence.  PT Long Term Goal 2 - Progress: Progressing toward goal Long Term Goal 3: Pt will improve his LE strength to [facility] in order to demonstrate mild gait impairments to decrease risk of secondary injury.  Long Term Goal 3 Progress: Progressing toward goal Long Term Goal 4: Pt will improve his FOTO to limiation less than 37% for improved QOL.   Problem List Patient Active Problem List   Diagnosis Date Noted  . Difficulty in walking(719.7) 03/12/2013  . Stiffness of right knee 03/12/2013  . Right leg weakness 03/12/2013  . Osteoarthritis of right knee 02/06/2013  .  LBBB (left bundle branch block) 01/07/2013  . Preoperative cardiovascular examination 01/07/2013  . DM2 (diabetes mellitus, type 2) 01/07/2013  . Hypothyroidism  01/07/2013  . Morbid obesity 01/07/2013  . HTN (hypertension) 01/07/2013  . Dyslipidemia 01/07/2013    PT - End of Session Activity Tolerance: Patient tolerated treatment well General Behavior During Therapy: [facility] for tasks assessed/performed  GP    Juel Burrow 03/19/2013, 9:53 AM

## 2013-03-25 ENCOUNTER — Ambulatory Visit (HOSPITAL_COMMUNITY)
Admission: RE | Admit: 2013-03-25 | Discharge: 2013-03-25 | Disposition: A | Discharge status: Home / Self Care | Payer: Medicare Other | Source: Ambulatory Visit | Attending: Orthopedic Surgery | Admitting: Orthopedic Surgery

## 2013-03-25 NOTE — Progress Notes (Signed)
Physical Therapy Treatment Patient Details  Name: Rickey Rice MRN: [id number] Date of Birth: 1941/01/15  Today's Date: 03/25/2013 Time: 0865-7846 PT Time Calculation (min): 56 min Charges: Therex x 96'(2952-8413) Manual x 10'(1506-1516) Ice x 10'(1518-1528)  Visit#: 4 of 10  Re-eval: 04/11/13  Authorization: [employer] Medicare  Authorization Visit#: 4 of 10   Subjective: Symptoms/Limitations Symptoms: Pt states that he has had little pain lately. Pain Assessment Currently in Pain?: Yes Pain Score: 2  Pain Location: Knee Pain Orientation: Right  Exercise/Treatments Standing Heel Raises: 15 reps;Limitations Heel Raises Limitations: toe raises x 15 Knee Flexion: 10 reps;Right Lateral Step Up: 10 reps;Step Height: 4";Hand Hold: 2;Right Forward Step Up: 10 reps;Step Height: 6";Hand Hold: 1;Right Functional Squat: 15 reps Rocker Board: 2 minutes;Limitations Rocker Board Limitations: R/L with HHA Supine Bridges: 20 reps;Limitations Bridges Limitations: 5 sec holds   Modalities Modalities: Cryotherapy Manual Therapy Manual Therapy: Edema management Edema Management: Retro massage to RLE to decrease edema Cryotherapy Number Minutes Cryotherapy: 10 Minutes Cryotherapy Location: Knee Type of Cryotherapy: Ice pack  Physical Therapy Assessment and Plan PT Assessment and Plan Clinical Impression Statement: Pt completes therex well after initial cueing and demo. Edema is present in RLE but has significantly decreased. Continued with manual techniques to decrease edema. Ice applied at end of session to limit pain and inflammation. PT Plan: Continue to progress strength and ROM per PT POC.  Begin bicycle next session for ROM.    Problem List Patient Active Problem List   Diagnosis Date Noted  . Difficulty in walking(719.7) 03/12/2013  . Stiffness of right knee 03/12/2013  . Right leg weakness 03/12/2013  . Osteoarthritis of right knee 02/06/2013  . LBBB (left bundle branch  block) 01/07/2013  . Preoperative cardiovascular examination 01/07/2013  . DM2 (diabetes mellitus, type 2) 01/07/2013  . Hypothyroidism 01/07/2013  . Morbid obesity 01/07/2013  . HTN (hypertension) 01/07/2013  . Dyslipidemia 01/07/2013    PT - End of Session Activity Tolerance: Patient tolerated treatment well General Behavior During Therapy: [facility] for tasks assessed/performed   Seth Bake, PTA  03/25/2013, 3:37 PM

## 2013-03-28 ENCOUNTER — Ambulatory Visit (HOSPITAL_COMMUNITY)
Admission: RE | Admit: 2013-03-28 | Discharge: 2013-03-28 | Disposition: A | Discharge status: Home / Self Care | Payer: Medicare Other | Source: Ambulatory Visit | Attending: Family Medicine | Admitting: Family Medicine

## 2013-03-28 DIAGNOSIS — IMO0001 Reserved for inherently not codable concepts without codable children: Secondary | ICD-10-CM | POA: Insufficient documentation

## 2013-03-28 DIAGNOSIS — R29898 Other symptoms and signs involving the musculoskeletal system: Secondary | ICD-10-CM

## 2013-03-28 DIAGNOSIS — M25569 Pain in unspecified knee: Secondary | ICD-10-CM | POA: Insufficient documentation

## 2013-03-28 DIAGNOSIS — M25669 Stiffness of unspecified knee, not elsewhere classified: Secondary | ICD-10-CM | POA: Insufficient documentation

## 2013-03-28 DIAGNOSIS — I10 Essential (primary) hypertension: Secondary | ICD-10-CM | POA: Insufficient documentation

## 2013-03-28 DIAGNOSIS — R262 Difficulty in walking, not elsewhere classified: Secondary | ICD-10-CM | POA: Insufficient documentation

## 2013-03-28 DIAGNOSIS — M25661 Stiffness of right knee, not elsewhere classified: Secondary | ICD-10-CM

## 2013-03-28 NOTE — Progress Notes (Signed)
Physical Therapy Treatment Patient Details  Name: Rickey Rice MRN: [id number] Date of Birth: 01/01/1941  Today's Date: 03/28/2013 Time: 5400-8676 PT Time Calculation (min): 59 min Charges: TE: [phone] Manual: 7124-5809 Ice: 9833-8250 Visit#: 5 of 10  Re-eval: 04/11/13 Assessment Diagnosis: Rt TKR Surgical Date: 02/05/13 Next MD Visit: Dr. Mayer Camel - 03/18/13  Authorization: [facility] Medicare  Authorization Time Period:    Authorization Visit#: 5 of 10   Subjective: Symptoms/Limitations Symptoms: Pt reports that he is a little stiff today.  He continues to walk with his SPC due to pain to the Left side.  He was able to work last week and reports he is having greater ease with his activities. He went up and down 14 steps yesterday and reports that he has fatigue and decreased strength by the end of the steps.  He reports he uses his rollator for community ambulation due to increased fatigue due to lack of activity tolerance.  Pain Assessment Currently in Pain?: Yes Pain Location: Knee Pain Orientation: Right  Precautions/Restrictions     Exercise/Treatments Aerobic Stationary Bike: 8 minutes for AROM Seat 13 Isokinetic: 210/195<>185/165<>165/150 x10 reps each Machines for Strengthening Cybex Knee Extension: RLE only 1 PL 2x15 Cybex Knee Flexion: BLE 5.5 PL 2x15 Standing Other Standing Knee Exercises: Sled Pushes 3 RT: Push then Pull Seated Stool Scoot - Round Trips: 2 RT w/rolling chair: Push and Pull    Cryotherapy 10 minutes Knee  Edema Management Supine with legs elevated to Rt knee to decrease swelling and decrease pain  Physical Therapy Assessment and Plan PT Assessment and Plan Clinical Impression Statement:  Added functional strengthening exercises today to improve LE strength and power.  Able to complete without increased pain.    Added bike to improve AROM and strength.  PT Plan: Continue to progress strength and ROM per PT POC.  Begin bicycle next session  for ROM.    Goals Home Exercise Program Pt/caregiver will Perform Home Exercise Program: Independently PT Goal: Perform Home Exercise Program - Progress: Progressing toward goal PT Short Term Goals Time to Complete Short Term Goals: 2 weeks PT Short Term Goal 1: Pt will improve BLE strength by 1/2 muscle grade in order to begin ambulating with a SPC indoors x10 minutes to improve community mobility.  PT Short Term Goal 1 - Progress: Progressing toward goal PT Short Term Goal 2: Pt will improve his functional strength in order to go from sit to stand from a 20 inch surface without UE A in order to comfortably get up from chairs at church.   PT Short Term Goal 2 - Progress: Progressing toward goal PT Long Term Goals Time to Complete Long Term Goals: 4 weeks PT Long Term Goal 1: Pt will improve his dynamic balance and demonstrate mod I gait with SPC in community x15 minutes in order to go shopping with his wife with greater ease.  PT Long Term Goal 1 - Progress: Progressing toward goal PT Long Term Goal 2: Pt will improve his LE strength to [facility] in order to begin driving again to improve independence.  PT Long Term Goal 2 - Progress: Progressing toward goal Long Term Goal 3: Pt will improve his LE strength to [facility] in order to demonstrate mild gait impairments to decrease risk of secondary injury.  Long Term Goal 3 Progress: Progressing toward goal Long Term Goal 4: Pt will improve his FOTO to limiation less than 37% for improved QOL.  Long Term Goal 4 Progress: Progressing toward goal  Problem List Patient Active Problem List   Diagnosis Date Noted  . Difficulty in walking(719.7) 03/12/2013  . Stiffness of right knee 03/12/2013  . Right leg weakness 03/12/2013  . Osteoarthritis of right knee 02/06/2013  . LBBB (left bundle branch block) 01/07/2013  . Preoperative cardiovascular examination 01/07/2013  . DM2 (diabetes mellitus, type 2) 01/07/2013  . Hypothyroidism 01/07/2013  . Morbid  obesity 01/07/2013  . HTN (hypertension) 01/07/2013  . Dyslipidemia 01/07/2013    PT - End of Session Activity Tolerance: Patient tolerated treatment well General Behavior During Therapy: [facility] for tasks assessed/performed  GP    Levonia Wolfley, MPT, ATC 03/28/2013, 1:57 PM

## 2013-03-31 ENCOUNTER — Encounter: Payer: Self-pay | Admitting: Internal Medicine

## 2013-03-31 ENCOUNTER — Ambulatory Visit (HOSPITAL_COMMUNITY)
Admission: RE | Admit: 2013-03-31 | Discharge: 2013-03-31 | Disposition: A | Discharge status: Home / Self Care | Payer: Medicare Other | Source: Ambulatory Visit | Attending: Orthopedic Surgery | Admitting: Orthopedic Surgery

## 2013-03-31 DIAGNOSIS — K5900 Constipation, unspecified: Secondary | ICD-10-CM | POA: Insufficient documentation

## 2013-03-31 NOTE — Progress Notes (Signed)
Physical Therapy Treatment Patient Details  Name: KAVI ALMQUIST MRN: [id number] Date of Birth: 06/15/1940  Today's Date: 03/31/2013 Time: 6767-2094 PT Time Calculation (min): 47 min Charges: Therex x 70'(9628- 1423) Ice x 10'(1425-1435)  Visit#: 6 of 10  Re-eval: 04/11/13  Authorization: [employer] Medicare  Authorization Visit#: 6 of 10   Subjective: Symptoms/Limitations Symptoms: Pt reports HEp compliance. Pain Assessment Currently in Pain?: No/denies  Exercise/Treatments Aerobic Stationary Bike: 8 minutes for AROM Seat 13 Isokinetic: 210/195<>185/165<>165/150 x10 reps each Machines for Strengthening Cybex Knee Extension: RLE only 1 PL 2x10 Cybex Knee Flexion: RLE only 5.5 PL 2x15 Standing Heel Raises: 15 reps;Limitations Heel Raises Limitations: toe raises x 15  Cryotherapy Number Minutes Cryotherapy: 10 Minutes Cryotherapy Location: Knee (Right) Type of Cryotherapy: Ice pack  Physical Therapy Assessment and Plan PT Assessment and Plan Clinical Impression Statement: Therapist facilitated activities to improve functional strength and stability. Pt completes therex well after initial cueing and demo. Strength and stability continue to improve. Swelling has decreased. Ice applied at end of session to limit pain and inflammation. PT Plan: Continue to progress strength and ROM per PT POC.     Problem List Patient Active Problem List   Diagnosis Date Noted  . Unspecified constipation 03/31/2013  . Difficulty in walking(719.7) 03/12/2013  . Stiffness of right knee 03/12/2013  . Right leg weakness 03/12/2013  . Osteoarthritis of right knee 02/06/2013  . LBBB (left bundle branch block) 01/07/2013  . Preoperative cardiovascular examination 01/07/2013  . DM2 (diabetes mellitus, type 2) 01/07/2013  . Hypothyroidism 01/07/2013  . Morbid obesity 01/07/2013  . HTN (hypertension) 01/07/2013  . Dyslipidemia 01/07/2013    PT - End of Session Activity Tolerance: Patient  tolerated treatment well General Behavior During Therapy: [facility] for tasks assessed/performed   Rachelle Hora, PTA  03/31/2013, 5:06 PM

## 2013-03-31 NOTE — Progress Notes (Signed)
Patient ID: Rickey Rice, male   DOB: 1940/06/28, 73 y.o.   MRN: [id number]        HISTORY & PHYSICAL  DATE: 02/12/2013     FACILITY: [facility]  LEVEL OF CARE: SNF (31)  ALLERGIES:  Allergies  Allergen Reactions  . Pioglitazone     Lips swelling  . Shrimp [Shellfish Allergy]     rash    CHIEF COMPLAINT:  Manage right knee osteoarthritis, diabetes mellitus, and hypothyroidism.    HISTORY OF PRESENT ILLNESS:  The patient is a 73 year-old, African-American male.    KNEE OSTEOARTHRITIS: Patient had a history of pain and functional disability in the knee due to end-stage osteoarthritis and has failed nonsurgical conservative treatments. Patient had worsening of pain with activity and weight bearing, pain that interfered with activities of daily living & pain with passive range of motion. Therefore patient underwent total knee arthroplasty and tolerated the procedure well. Patient is admitted to this facility for sort short-term rehabilitation. Patient denies knee pain.    DM:pt's DM remains stable.  Pt denies polyuria, polydipsia, polyphagia, changes in vision or hypoglycemic episodes.  No complications noted from the medication presently being used.  Last hemoglobin A1c is:  Not available.    HYPOTHYROIDISM: The hypothyroidism remains stable. No complications noted from the medications presently being used.  The patient denies fatigue or constipation.  Last TSH:  Not available.     PAST MEDICAL HISTORY :  Past Medical History  Diagnosis Date  . Diabetes mellitus without complication     type 2  . Hypertension   . Hypothyroidism   . Morbid obesity   . Dyslipidemia   . LBBB (left bundle branch block)   . History of nuclear stress test 04/2012    lexiscan - 2 day protocol; low risk study, evidence of inferrior & apical scar but no ischemia   . Arthritis     PAST SURGICAL HISTORY: Past Surgical History  Procedure Laterality Date  . Mouth surgery  1963  .  Finger surgery  1954  . Colonoscopy w/ biopsies    . Knee arthroscopy Left   . Total knee arthroplasty Right 02/05/2013    Dr Mayer Camel  . Total knee arthroplasty Right 02/05/2013    Procedure: TOTAL KNEE ARTHROPLASTY;  Surgeon: Kerin Salen, MD;  Location: [facility];  Service: Orthopedics;  Laterality: Right;    SOCIAL HISTORY:  reports that he has never smoked. He has never used smokeless tobacco. He reports that he does not drink alcohol or use illicit drugs.  FAMILY HISTORY:  Family History  Problem Relation Age of Onset  . Cancer Mother   . Kidney disease Brother   . Hypertension Sister     CURRENT MEDICATIONS: Reviewed per [facility]  REVIEW OF SYSTEMS:   GI:  Complains of constipation.    See HPI otherwise 14 point ROS is negative.  PHYSICAL EXAMINATION  VS:  T 98.4       P 84      RR 20      BP 128/62      POX 98%        WT (Lb)  GENERAL: no acute distress, moderately obese body habitus EYES: conjunctivae normal, sclerae normal, normal eye lids MOUTH/THROAT: lips without lesions,no lesions in the mouth,tongue is without lesions,uvula elevates in midline NECK: supple, trachea midline, no neck masses, no thyroid tenderness, no thyromegaly LYMPHATICS: no LAN in the neck, no supraclavicular LAN RESPIRATORY: breathing is  even & unlabored, BS CTAB CARDIAC: RRR, no murmur,no extra heart sounds EDEMA/VARICOSITIES:  +2 bilateral lower extremity edema  ARTERIAL:  pedal pulses nonpalpable   GI:  ABDOMEN: abdomen soft, normal BS, no masses, no tenderness  LIVER/SPLEEN: no hepatomegaly, no splenomegaly MUSCULOSKELETAL: HEAD: normal to inspection & palpation BACK: no kyphosis, scoliosis or spinal processes tenderness EXTREMITIES: LEFT UPPER EXTREMITY: full range of motion, normal strength & tone RIGHT UPPER EXTREMITY:  full range of motion, normal strength & tone LEFT LOWER EXTREMITY:  full range of motion, normal strength & tone RIGHT LOWER EXTREMITY: strength intact, range of motion  not tested due to surgery   PSYCHIATRIC: the patient is alert & oriented to person, affect & behavior appropriate  LABS/RADIOLOGY: Chest x-ray:  No acute disease.    Labs reviewed: Basic Metabolic Panel:  Recent Labs  01/29/13 0944  NA 143  K 4.0  CL 105  CO2 28  GLUCOSE 121*  BUN 17  CREATININE 1.10  CALCIUM 9.5    CBC:  Recent Labs  01/29/13 0944 02/06/13 0418 02/07/13 0557 02/08/13 0410  WBC 5.4 6.3 7.7 8.2  NEUTROABS 2.7  --   --   --   HGB 12.8* 10.8* 9.7* 9.7*  HCT 37.8* 30.7* 28.8* 28.2*  MCV 93.6 91.1 91.7 91.9  PLT 186 170 141* 151   CBG:  Recent Labs  02/09/13 2129 02/10/13 0708 02/10/13 1123  GLUCAP 113* 96 146*     ASSESSMENT/PLAN:  Right knee osteoarthritis.  Status post right total knee arthroplasty.  Continue rehabilitation.    Diabetes mellitus.  Check hemoglobin A1c.    Hypothyroidism.  Check TSH.    Constipation.  New problem.  Start MiraLAX 17 g q.d.    Hypertension.  Well controlled.    Check CBC and BMP.    THN Metrics:   Aspirin 325 mg.      I have reviewed patient's medical records received at admission/from hospitalization.  CPT CODE: 37106

## 2013-04-02 ENCOUNTER — Ambulatory Visit (HOSPITAL_COMMUNITY)
Admission: RE | Admit: 2013-04-02 | Discharge: 2013-04-02 | Disposition: A | Discharge status: Home / Self Care | Payer: Medicare Other | Source: Ambulatory Visit | Attending: Orthopedic Surgery | Admitting: Orthopedic Surgery

## 2013-04-02 DIAGNOSIS — M25661 Stiffness of right knee, not elsewhere classified: Secondary | ICD-10-CM

## 2013-04-02 DIAGNOSIS — R29898 Other symptoms and signs involving the musculoskeletal system: Secondary | ICD-10-CM

## 2013-04-02 DIAGNOSIS — R262 Difficulty in walking, not elsewhere classified: Secondary | ICD-10-CM

## 2013-04-02 NOTE — Progress Notes (Signed)
Physical Therapy Treatment Patient Details  Name: TARRIS DELBENE MRN: [id number] Date of Birth: 13-Jul-1940  Today's Date: 04/02/2013 Time: 2423-5361 PT Time Calculation (min): 52 min Charge: TE 1348-1430, Ice 1430-1440  Visit#: 7 of 10  Re-eval: 04/11/13    Authorization: [facility] Medicare  Authorization Time Period:    Authorization Visit#: 7 of 10   Subjective: Symptoms/Limitations Symptoms: Pt reports he is progressing well, feels his strength is improving.  Reports difficulty getting LE into his Lucianne Lei.  Very minimal pain today maybe .5/10 Pain Assessment Currently in Pain?: Yes Pain Score: 1  Pain Location: Knee Pain Orientation: Right  Objective:   Exercise/Treatments Aerobic Stationary Bike: 8 minutes for AROM Seat 13 Isokinetic: 185/165<>165/150 <> 150/135 x10 reps each Machines for Strengthening Cybex Knee Extension: RLE only 2 PL 2x10 Cybex Knee Flexion: RLE only 6 PL 2x15 Standing Heel Raises: 20 reps;Limitations Heel Raises Limitations: toe raises x 20 Other Standing Knee Exercises: Sled Pushes 5 RT: Push then Pull Other Standing Knee Exercises: High march and hold with 3# on ankle 15x 5"  Modalities Modalities: Cryotherapy Cryotherapy Number Minutes Cryotherapy: 10 Minutes Cryotherapy Location: Knee Type of Cryotherapy: Ice pack  Physical Therapy Assessment and Plan PT Assessment and Plan Clinical Impression Statement: Progressed functional strengthening activities for increased ease with functional tasks.  Added marching holds to improve hip flexion for increased ease getting in and out of vehicle.  Increased weight with cybex weight machines and increased difficulty with isokinetic machine for strengthening.  No reports of increased pain with any activity tolday, pt was limited by fatigue at end of session.  Ice was applied to limited pain and swelling. PT Plan: Continue to progress strength and ROM per PT POC.     Goals Home Exercise Program Pt/caregiver  will Perform Home Exercise Program: Independently PT Short Term Goals Time to Complete Short Term Goals: 2 weeks PT Short Term Goal 1: Pt will improve BLE strength by 1/2 muscle grade in order to begin ambulating with a SPC indoors x10 minutes to improve community mobility.  PT Short Term Goal 1 - Progress: Progressing toward goal PT Short Term Goal 2: Pt will improve his functional strength in order to go from sit to stand from a 20 inch surface without UE A in order to comfortably get up from chairs at church.   PT Short Term Goal 2 - Progress: Progressing toward goal PT Long Term Goals Time to Complete Long Term Goals: 4 weeks PT Long Term Goal 1: Pt will improve his dynamic balance and demonstrate mod I gait with SPC in community x15 minutes in order to go shopping with his wife with greater ease.  PT Long Term Goal 2: Pt will improve his LE strength to [facility] in order to begin driving again to improve independence.  PT Long Term Goal 2 - Progress: Progressing toward goal Long Term Goal 3: Pt will improve his LE strength to [facility] in order to demonstrate mild gait impairments to decrease risk of secondary injury.  Long Term Goal 3 Progress: Progressing toward goal Long Term Goal 4: Pt will improve his FOTO to limiation less than 37% for improved QOL.   Problem List Patient Active Problem List   Diagnosis Date Noted  . Unspecified constipation 03/31/2013  . Difficulty in walking(719.7) 03/12/2013  . Stiffness of right knee 03/12/2013  . Right leg weakness 03/12/2013  . Osteoarthritis of right knee 02/06/2013  . LBBB (left bundle branch block) 01/07/2013  . Preoperative cardiovascular examination 01/07/2013  .  DM2 (diabetes mellitus, type 2) 01/07/2013  . Hypothyroidism 01/07/2013  . Morbid obesity 01/07/2013  . HTN (hypertension) 01/07/2013  . Dyslipidemia 01/07/2013    PT - End of Session Activity Tolerance: Patient tolerated treatment well General Behavior During Therapy: [facility] for  tasks assessed/performed  GP    Aldona Lento 04/02/2013, 2:43 PM

## 2013-04-04 ENCOUNTER — Ambulatory Visit (HOSPITAL_COMMUNITY)
Admission: RE | Admit: 2013-04-04 | Discharge: 2013-04-04 | Disposition: A | Discharge status: Home / Self Care | Payer: Medicare Other | Source: Ambulatory Visit | Attending: Family Medicine | Admitting: Family Medicine

## 2013-04-04 DIAGNOSIS — M25661 Stiffness of right knee, not elsewhere classified: Secondary | ICD-10-CM

## 2013-04-04 DIAGNOSIS — R262 Difficulty in walking, not elsewhere classified: Secondary | ICD-10-CM

## 2013-04-04 DIAGNOSIS — R29898 Other symptoms and signs involving the musculoskeletal system: Secondary | ICD-10-CM

## 2013-04-04 NOTE — Progress Notes (Signed)
Physical Therapy Treatment Patient Details  Name: Rickey Rice MRN: [id number] Date of Birth: 11-13-1940  Today's Date: 04/04/2013 Time: 1435-1540 PT Time Calculation (min): 65 min Charges:  TE: 1435-1510 Manual: 5732-2025 Ice: 4270-6237 Visit#: 8 of 10  Re-eval: 04/11/13 Assessment Diagnosis: Rt TKR Surgical Date: 02/05/13 Next MD Visit: Dr. Mayer Camel - 03/18/13  Authorization: [facility] Medicare  Authorization Time Period:    Authorization Visit#: 8 of 10   Subjective: Symptoms/Limitations Symptoms: Pt reports he is having the most difficulty lifting his leg into his van.  Pain Assessment Currently in Pain?: Yes Pain Score: 1  Pain Location: Knee Pain Orientation: Right  Precautions/Restrictions     Exercise/Treatments Mobility/Balance        Stretches   Aerobic Stationary Bike: 10 AROM seat 14 Resistance 5 with " Machines for Strengthening Cybex Knee Extension: BLE 5 PL 2x20 Cybex Knee Flexion: BLE 8 PL 2x20 Plyometrics   Standing   Seated Other Seated Knee Exercises: Isometric hip flexion 8x5 sec holds Supine Straight Leg Raises: 10 reps;Limitations Straight Leg Raises Limitations: 5 sec holds with pertabations Sidelying   Prone      Modalities Modalities: Cryotherapy Manual Therapy Edema Management: Supine with legs elevated: Retro massage to RLE to decrease edema Cryotherapy Number Minutes Cryotherapy: 10 Minutes Cryotherapy Location: Knee Type of Cryotherapy: Ice pack  Physical Therapy Assessment and Plan PT Assessment and Plan Clinical Impression Statement: Pt presents with lymphedema to RLE below knee, and without foot involvement.  Educated pt on lymphedema vs. pitting edema and likely the lymphedema is what is causing him the most difficulty getting into and out of his [employer].  Sent script to MD for lymphedema tx and compression garments.  Added isometric holds to improve hip flexion strength in seated and supine to improve functional abilty.   Ended with manual therapy to decreased pain with gentle manual therapy to decrease lymphedema.  PT Plan: MLD with bandaging next session.  F/u on order from MD on lymphedema tx and compression garments.  Continue with strengthening to improve pt ability to get into and out of his [employer].     Goals    Problem List Patient Active Problem List   Diagnosis Date Noted  . Unspecified constipation 03/31/2013  . Difficulty in walking(719.7) 03/12/2013  . Stiffness of right knee 03/12/2013  . Right leg weakness 03/12/2013  . Osteoarthritis of right knee 02/06/2013  . LBBB (left bundle branch block) 01/07/2013  . Preoperative cardiovascular examination 01/07/2013  . DM2 (diabetes mellitus, type 2) 01/07/2013  . Hypothyroidism 01/07/2013  . Morbid obesity 01/07/2013  . HTN (hypertension) 01/07/2013  . Dyslipidemia 01/07/2013    PT - End of Session Activity Tolerance: Patient tolerated treatment well General Behavior During Therapy: [facility] for tasks assessed/performed  GP    Patrina Andreas 04/04/2013, 3:49 PM

## 2013-04-07 ENCOUNTER — Ambulatory Visit (HOSPITAL_COMMUNITY)
Admission: RE | Admit: 2013-04-07 | Discharge: 2013-04-07 | Disposition: A | Discharge status: Home / Self Care | Payer: Medicare Other | Source: Ambulatory Visit | Attending: Family Medicine | Admitting: Family Medicine

## 2013-04-07 NOTE — Progress Notes (Signed)
Physical Therapy Treatment Patient Details  Name: Rickey Rice MRN: [id number] Date of Birth: 08/08/1940  Today's Date: 04/07/2013 Time: 1345-1510 PT Time Calculation (min): 85 min Visit#: 9 of 10  Re-eval: 04/11/13 Authorization: [employer] Medicare  Authorization Visit#: 9 of 10  Charges:  Manual 65', self care 15'  Subjective: Symptoms/Limitations Symptoms: Pt states it is hard getting his Rt leg into his [employer]i because it is so swollen and heavy Pain Assessment Currently in Pain?: Yes Pain Score: 1  Pain Location: Knee Pain Orientation: Right  Objective:   Date 04/07/2013    Right Left  MTP 26.40 26.30  ankle 30 31.5  4cm 30.00 31.20  8cm 33.50 32.70  12 cm 37.00 37.00  16cm 41.70 42.00  20cm 45.70 44.80  24cm 48.00 47.50  28cm 49.00 47.80  32cm 48.20 46.50  36cm 46.00 44.00          Sum of squares 17959.83 17506.05  Total Volume 5716.793 5572.351     Manual Therapy Manual Therapy: Other (comment) Edema Management: Manual Decongestive therapy to Rt LE routing to Rt inguinal nodes to decrease edema f/b coban #2 wrapping for external compression.  Physical Therapy Assessment and Plan PT Assessment and Plan Clinical Impression Statement: Began MLD on Rt LE with education on general management of lymphedema.  Pt instructed on importance of skincare and where to get bandages to begin next visit.  Rt LE with induration and general edema.  Circumferential measurements reveal Rt LE wtih 144.44 cc more fluid than Lt LE.  Pt given written handout regarding care for lymphedema.  Pt able to verbalize understanding of all instructions.   PT Plan: MLD with bandaging next session. Await signed order from MD for compression garments.  Re-evaluate next visit requesting increased treatment time to address strengtheing and lymphedema management.      Problem List Patient Active Problem List   Diagnosis Date Noted  . Unspecified constipation 03/31/2013  . Difficulty in  walking(719.7) 03/12/2013  . Stiffness of right knee 03/12/2013  . Right leg weakness 03/12/2013  . Osteoarthritis of right knee 02/06/2013  . LBBB (left bundle branch block) 01/07/2013  . Preoperative cardiovascular examination 01/07/2013  . DM2 (diabetes mellitus, type 2) 01/07/2013  . Hypothyroidism 01/07/2013  . Morbid obesity 01/07/2013  . HTN (hypertension) 01/07/2013  . Dyslipidemia 01/07/2013    PT - End of Session Activity Tolerance: Patient tolerated treatment well General Behavior During Therapy: WFL for tasks assessed/performed   Teena Irani, PTA/CLT 04/07/2013, 3:59 PM

## 2013-04-09 ENCOUNTER — Ambulatory Visit (HOSPITAL_COMMUNITY)
Admission: RE | Admit: 2013-04-09 | Discharge: 2013-04-09 | Disposition: A | Discharge status: Home / Self Care | Payer: Medicare Other | Source: Ambulatory Visit | Attending: *Deleted | Admitting: *Deleted

## 2013-04-09 ENCOUNTER — Ambulatory Visit (HOSPITAL_COMMUNITY)
Admission: RE | Admit: 2013-04-09 | Discharge: 2013-04-09 | Disposition: A | Discharge status: Home / Self Care | Payer: Medicare Other | Source: Ambulatory Visit | Attending: Family Medicine | Admitting: Family Medicine

## 2013-04-09 DIAGNOSIS — M25661 Stiffness of right knee, not elsewhere classified: Secondary | ICD-10-CM

## 2013-04-09 DIAGNOSIS — R29898 Other symptoms and signs involving the musculoskeletal system: Secondary | ICD-10-CM

## 2013-04-09 DIAGNOSIS — R262 Difficulty in walking, not elsewhere classified: Secondary | ICD-10-CM

## 2013-04-09 NOTE — Evaluation (Addendum)
Physical Therapy Re-Evaluation  Patient Details  Name: Rickey Rice MRN: [id number] Date of Birth: 1940/11/10  Today's Date: 04/09/2013 Time: 1751-0258 PT Time Calculation (min): 90 min Charges:   Manual: 1345-1440 TE: [id number] Self Care: 1455-1510 MMT/ROM: 3536-1443           Visit#: 10 of 18  Re-eval: 05/09/13 Assessment Diagnosis: Rt TKR Surgical Date: 02/05/13 Next MD Visit: Dr. Mayer Camel - 03/18/13  Authorization: [facility] Medicare    Authorization Time Period:    Authorization Visit#: 10 of 20    Subjective Symptoms/Limitations Symptoms: Pt states he voided every 2 hours.  Wife is present with patient today to be educated on manual lymph drainage for Rt LE.  Pt comes with short stretch bandages purchased to begin bandaging on Rt LE.  Pt reports his LE feels much better and wife states noted improvement.  Pt reports increased ease getting into/out of car.  Currently without pain Rt LE (states LT knee hurts) but medicated prior to appt. Pain Assessment Currently in Pain?: Yes Pain Score: 1  Pain Location: Knee Pain Orientation: Right  Sensation/Coordination/Flexibility/Functional Tests Functional Tests Functional Tests: FOTO: 58/42(was 55/45)  Assessment RLE AROM (degrees) Right Knee Extension: 0 (was 0) Right Knee Flexion: 115 (was 108) RLE Strength Right Hip Flexion: 4/5 (was 4/5) Right Hip Extension: 3/5 (was 3-/5) Right Hip ABduction: 3+/5 (was 3/5) Right Hip ADduction: 4/5 (was 3+/5) Right Knee Flexion: 5/5 (was 3+/5) Right Knee Extension: 4/5 (was 3+/5)  Mobility/Balance  Ambulation/Gait Ambulation/Gait: Yes Assistive device: Straight cane (intermittent) Gait Pattern: Decreased hip/knee flexion - right;Left foot flat;Right foot flat   Exercise/Treatments Aerobic Elliptical: NuStep: 8 minutesHills #1, Resistance 2, Steps per minute 123 Machines for Strengthening Cybex Knee Extension: BLE 5 PL 2x20 Cybex Knee Flexion: BLE 8 PL 2x20   Manual  Therapy Manual Therapy: Other (comment) Edema Management: provided by Roseanne Reno, PTA/CLT [phone]: Manual decongestive therapy to Rt LE routing to Rt inguinal/axillary nodes to decrease edema f/b lotion to improve skin integrity, #6 netting, foam and multilayer short stretch bandaging.  Physical Therapy Assessment and Plan PT Assessment and Plan Clinical Impression Statement: Mr. Seith has attended 10 OP PT visits over the past 4 weeks with the following findings: has started MLD to decreased lymphemdema to his LLE with favorable results and has improved skin integrityhas improved knee AROM 0-115 degrees, continues to have greatest limitation with stength which is likely due from lyphmedema ti gus RLE which is responding well to manual lymph drainage techniques and is having greater ease with mobility. Pt will continue to benefit from skilled OP PT to decrease lymphedema and improve overall function Pt will benefit from skilled therapeutic intervention in order to improve on the following deficits: Decreased strength;Decreased mobility;Increased edema Rehab Potential: Good PT Frequency: Min 3X/week PT Duration: 4 weeks PT Treatment/Interventions: Gait training;Stair training;Functional mobility training;Therapeutic activities;Therapeutic exercise;Balance training;Neuromuscular re-education;Patient/family education;Manual techniques PT Plan: MLD with bandaging next session. Await signed order from MD for compression garments.  Re-faxed order for 2nd time, pt will f/u. Continue to focus on LE functional strengthening.     Goals Home Exercise Program Pt/caregiver will Perform Home Exercise Program: Independently PT Short Term Goals Time to Complete Short Term Goals: 2 weeks PT Short Term Goal 1: Pt will improve BLE strength by 1/2 muscle grade in order to begin ambulating with a SPC indoors x10 minutes to improve community mobility.  (going in [employer] club with [facility]) PT Short Term Goal 1 -  Progress: Met PT Short Term  Goal 2: Pt will improve his functional strength in order to go from sit to stand from a 20 inch surface without UE A in order to comfortably get up from chairs at church.   (able to go from STS from 20 in. not able to complete in church) PT Short Term Goal 2 - Progress: Partly met PT Long Term Goals Time to Complete Long Term Goals: 4 weeks PT Long Term Goal 1: Pt will improve his dynamic balance and demonstrate mod I gait with SPC in community x15 minutes in order to go shopping with his wife with greater ease.  PT Long Term Goal 1 - Progress: Met PT Long Term Goal 2: Pt will improve his LE strength to [facility] in order to begin driving again to improve independence.  PT Long Term Goal 2 - Progress: Met Long Term Goal 3: Pt will improve his LE strength to [facility] in order to demonstrate mild gait impairments to decrease risk of secondary injury.  Long Term Goal 3 Progress: Met Long Term Goal 4: Pt will improve his FOTO to limiation less than 37% for improved QOL.  Long Term Goal 4 Progress: Progressing toward goal  Problem List Patient Active Problem List   Diagnosis Date Noted  . Unspecified constipation 03/31/2013  . Difficulty in walking(719.7) 03/12/2013  . Stiffness of right knee 03/12/2013  . Right leg weakness 03/12/2013  . Osteoarthritis of right knee 02/06/2013  . LBBB (left bundle branch block) 01/07/2013  . Preoperative cardiovascular examination 01/07/2013  . DM2 (diabetes mellitus, type 2) 01/07/2013  . Hypothyroidism 01/07/2013  . Morbid obesity 01/07/2013  . HTN (hypertension) 01/07/2013  . Dyslipidemia 01/07/2013    PT - End of Session Activity Tolerance: Patient tolerated treatment well General Behavior During Therapy: WFL for tasks assessed/performed PT Plan of Care PT Patient Instructions: f/u with MD about compression stockings, FOTO, reviewed goals Consulted and Agree with Plan of Care: Patient  GP Functional Assessment Tool Used: FOTO:  58/42 Functional Limitation: Mobility: Walking and moving around Mobility: Walking and Moving Around Current Status (U1324): At least 40 percent but less than 60 percent impaired, limited or restricted Mobility: Walking and Moving Around Goal Status [phone]): At least 20 percent but less than 40 percent impaired, limited or restricted  Jakayla Schweppe, MPT, ATC 04/09/2013, 3:58 PM  Physician Documentation Your signature is required to indicate approval of the treatment plan as stated above.  Please sign and either send electronically or make a copy of this report for your files and return this physician signed original.   Please mark one 1.__approve of plan  2. ___approve of plan with the following conditions.   ______________________________                                                          _____________________ Physician Signature  Date  

## 2013-04-11 ENCOUNTER — Ambulatory Visit (HOSPITAL_COMMUNITY)
Admission: RE | Admit: 2013-04-11 | Discharge: 2013-04-11 | Disposition: A | Discharge status: Home / Self Care | Payer: Medicare Other | Source: Ambulatory Visit | Attending: Family Medicine | Admitting: Family Medicine

## 2013-04-11 DIAGNOSIS — R262 Difficulty in walking, not elsewhere classified: Secondary | ICD-10-CM

## 2013-04-11 DIAGNOSIS — R29898 Other symptoms and signs involving the musculoskeletal system: Secondary | ICD-10-CM

## 2013-04-11 DIAGNOSIS — M25661 Stiffness of right knee, not elsewhere classified: Secondary | ICD-10-CM

## 2013-04-11 NOTE — Progress Notes (Signed)
Physical Therapy Treatment Patient Details  Name: Rickey Rice MRN: [id number] Date of Birth: Aug 02, 1940  Today's Date: 04/11/2013 Time: 1350-1430 PT Time Calculation (min): 40 min  Visit#: 11 of 18  Re-eval: 05/09/13    Authorization: [employer] Medicare  Authorization Visit#: 11 of 20   Subjective: Symptoms/Limitations Symptoms: Pt states that he is still voiding more frequently.  States his knee feels like it is part of him again.      Exercise/Treatments    Manual Therapy Manual Therapy: Other (comment) Other Manual Therapy: Pt recieved manual decongestive therapy including short neck; abdominal, Rt inguinal axillary anastomosis and Rt LE to decrease swelling.  Lotion and netting applied followed by multilayer short stretch bandaging with foam.   Physical Therapy Assessment and Plan PT Assessment and Plan Clinical Impression Statement: Pt LE has significant decreased induration as well as swelling.  Pt only able to have a 45 minutes slot today therefore therapy focused on swelling and the reduction of swelling.  Pt continues to respond favorably to manual techniques. PT Plan: Pt may be ready for garments next week at that time pt may be ready for discharge.      Goals  progressing.  Problem List Patient Active Problem List   Diagnosis Date Noted  . Unspecified constipation 03/31/2013  . Difficulty in walking(719.7) 03/12/2013  . Stiffness of right knee 03/12/2013  . Right leg weakness 03/12/2013  . Osteoarthritis of right knee 02/06/2013  . LBBB (left bundle branch block) 01/07/2013  . Preoperative cardiovascular examination 01/07/2013  . DM2 (diabetes mellitus, type 2) 01/07/2013  . Hypothyroidism 01/07/2013  . Morbid obesity 01/07/2013  . HTN (hypertension) 01/07/2013  . Dyslipidemia 01/07/2013       GP    RUSSELL,CINDY 04/11/2013, 3:15 PM

## 2013-04-15 ENCOUNTER — Ambulatory Visit (HOSPITAL_COMMUNITY)
Admission: RE | Admit: 2013-04-15 | Discharge: 2013-04-15 | Disposition: A | Discharge status: Home / Self Care | Payer: Medicare Other | Source: Ambulatory Visit | Attending: Family Medicine | Admitting: Family Medicine

## 2013-04-16 NOTE — Progress Notes (Signed)
Physical Therapy Treatment Patient Details  Name: ZAIM NITTA MRN: [id number] Date of Birth: 20-Apr-1940  Today's Date: 04/15/2013 Time: 1355-1500  PT Time Calculation (min): 65 min  Visit#: 12 of 18  Re-eval: 05/09/13  Authorization: [facility] Medicare  Authorization Visit#: 12 of 20  Charges:  Manual 1355-1435 40', self care 15' 1440-1455  Subjective:  Symptoms/Limitations  Symptoms: Pt states that he is doing wonderful.  Appointment made with Edd [facility] at [facility] for Friday at 2pm for compression stockings.   Measured pt today for Medium long garment.  Objective: Manual Therapy  Manual Therapy: Other (comment)  Other Manual Therapy: Rt LE remeasured today (see below).  Pt recieved manual decongestive therapy including short neck; abdominal, Rt inguinal axillary anastomosis and Rt LE to decrease swelling. Lotion and netting applied followed by multilayer short stretch bandaging with foam.   Date 04/07/2013  04/15/2013   Right Left Right  MCP 26.40 26.30 26.4  WRIST 30 31.5 27.50  4cm 30.00 31.20 26.60  8cm 33.50 32.70 27.70  12 cm 37.00 37.00 31.50  16cm 41.70 42.00 35.40  20cm 45.70 44.80 38.50  24cm 48.00 47.50 40.50  28cm 49.00 47.80 41.50  32cm 48.20 46.50 43.50  36cm 46.00 44.00 44.50            Sum of squares 17959.83 17506.05 13890.72  Total Volume 5716.793 5572.351 4421.555    Physical Therapy Assessment and Plan  PT Assessment and Plan  Clinical Impression Statement: Pt LE has significant decreased induration as well as swelling per measurements today. Overall loss of 1.295.2 cc from Rt LE since 04/07/13.  Rt LE now has 1,150.75 cc less volume than Lt LE.  Appointment made today for Friday to get compression garments.  Unable to complete TKR exercises today due to increased time spent on manual, measurement, education and bandaging.  May discontinue bandaging after receives garments and continue to focus on TKR therapy.  Pt educated on importance of wearing garments  daily and removal at night to moisturize.  Pt verbalized understanding. PT Plan: Pt to get garments on Friday and may be discharged from lymphedema therapy at that time.  Continue with focus on Knee therapy including improving ROM and strength.    Goals  Progressing.; has met all Lymphedema goals.   Problem List Patient Active Problem List   Diagnosis Date Noted  . Unspecified constipation 03/31/2013  . Difficulty in walking(719.7) 03/12/2013  . Stiffness of right knee 03/12/2013  . Right leg weakness 03/12/2013  . Osteoarthritis of right knee 02/06/2013  . LBBB (left bundle branch block) 01/07/2013  . Preoperative cardiovascular examination 01/07/2013  . DM2 (diabetes mellitus, type 2) 01/07/2013  . Hypothyroidism 01/07/2013  . Morbid obesity 01/07/2013  . HTN (hypertension) 01/07/2013  . Dyslipidemia 01/07/2013      Teena Irani, PTA/CLT 04/16/2013, 8:50 AM

## 2013-04-17 ENCOUNTER — Ambulatory Visit (HOSPITAL_COMMUNITY)
Admission: RE | Admit: 2013-04-17 | Discharge: 2013-04-17 | Disposition: A | Discharge status: Home / Self Care | Payer: Medicare Other | Source: Ambulatory Visit | Attending: Family Medicine | Admitting: Family Medicine

## 2013-04-17 NOTE — Progress Notes (Signed)
Physical Therapy Treatment Patient Details  Name: ARBIE BLANKLEY MRN: [id number] Date of Birth: [birth year]  Today's Date: 04/17/2013 Time: 1522-1604 PT Time Calculation (min): 42 min Visit#: 13 of 18  Re-eval: 05/09/13 Authorization: [employer] Medicare  Authorization Visit#: 13 of 20  Charges:  Manual 40  Subjective: Symptoms/Limitations Symptoms: Pt states he would like to focus session today on his Lt LE bandaging; states he can do his knee exercises at home today (only 76' alloted time today). Pain Assessment Currently in Pain?: No/denies   Objective: Manual Therapy Manual Therapy: Other (comment) Edema Management: Rt LE re-bandaged today following brief MLD, cleansing of Rt LE and application of lotion.  Bandaging completed with #6 netting, foam and multilayer short stretch bandaging  Physical Therapy Assessment and Plan PT Assessment and Plan Clinical Impression Statement: Discussed MLD techniques with pt verbaliziong understanding.  Focused session on Rt LE lymphedema today per patient request.  Pt to receive garment tomorrow and can discharge lymph therapy with focus on Rt TKR therapy.   PT Plan: Resume and focus on Rt knee therapy; discharge lymph therapy.     Problem List Patient Active Problem List   Diagnosis Date Noted  . Unspecified constipation 03/31/2013  . Difficulty in walking(719.7) 03/12/2013  . Stiffness of right knee 03/12/2013  . Right leg weakness 03/12/2013  . Osteoarthritis of right knee 02/06/2013  . LBBB (left bundle branch block) 01/07/2013  . Preoperative cardiovascular examination 01/07/2013  . DM2 (diabetes mellitus, type 2) 01/07/2013  . Hypothyroidism 01/07/2013  . Morbid obesity 01/07/2013  . HTN (hypertension) 01/07/2013  . Dyslipidemia 01/07/2013      GP Functional Assessment Tool Used: FOTO: 58/42  Teena Irani, PTA/CLT 04/17/2013, 5:04 PM

## 2013-04-21 ENCOUNTER — Inpatient Hospital Stay (HOSPITAL_COMMUNITY): Admission: RE | Admit: 2013-04-21 | Payer: Medicare Other | Source: Ambulatory Visit | Admitting: Physical Therapy

## 2013-04-23 ENCOUNTER — Ambulatory Visit (HOSPITAL_COMMUNITY)
Admission: RE | Admit: 2013-04-23 | Discharge: 2013-04-23 | Disposition: A | Discharge status: Home / Self Care | Payer: Medicare Other | Source: Ambulatory Visit | Attending: Family Medicine | Admitting: Family Medicine

## 2013-04-23 NOTE — Progress Notes (Signed)
Physical Therapy Discharge  Patient Details  Name: Rickey Rice MRN: [id number] Date of Birth: 13-May-1940  Today's Date: 04/23/2013 Time: 0301-3143 PT Time Calculation (min): 45 min         Visit#: 14 of 18  Re-eval: 05/09/13 Assessment Diagnosis: Rt TKR Surgical Date: 02/05/13 Next MD Visit: Dr. Mayer Rice - march 2015 Authorization: [facility] Medicare    Authorization Visit#: 6 of 20  Charges:  Self care 15', MMT/ROM testing, PPT 10'   Subjective PT states he got his juxtafits at [facility] on Friday and is having no problems other than top ring of anklet.  Pt instructed to don over the inner liner to increase comfort.  Pt reports no pain currently.   Objective Functional Tests Functional Tests: FOTO: 51/49(was 58/42)    RLE AROM (degrees) Right Knee Extension: 0 Right Knee Flexion: 120 (was 115 degrees)  RLE Strength Right Hip Flexion: 4/5 (was 4/5) Right Hip Extension: 4/5 (was 3/5) Right Hip ABduction: 5/5 (was 3+/5) Right Hip ADduction: 5/5 (was 4/5) Right Knee Flexion: 5/5 (was 5/5) Right Knee Extension: 5/5 (was 4/5)  Mobility/Balance  Ambulation/Gait Ambulation/Gait: Yes Assistive device: Straight cane (states only using due to Lt knee pain)      Physical Therapy Assessment and Plan PT Assessment and Plan Clinical Impression Statement: Pt has completed 14/18 PT treatments for lymphedema and TKR therapy.  Pt has progressed well overall.  Currently, pt is independent with HEP and manual lymph drainage massage for Rt LE.  Pt comes today donning appropriate compression garments and is independent with donning/doffing garments.  Rt LE strength is WFL and Rt knee AROM is now 0-120 degrees.  Pt continues to ambulate with SPC but only because of Lt LE, which he plans on having a TKR on Lt in next few months.  Pt has met all STG's and 3/4 LTG's.  Counseled with patient and agrees to discharge at this time.   Pt also given information regarding compression garment outlet in  [place]. PT Plan: Discharge skilled therapy, continue HEP.    Goals Home Exercise Program Pt/caregiver will Perform Home Exercise Program: Independently PT Goal: Perform Home Exercise Program - Progress: Met PT Short Term Goals Time to Complete Short Term Goals: 2 weeks PT Short Term Goal 1: Pt will improve BLE strength by 1/2 muscle grade in order to begin ambulating with a SPC indoors x10 minutes to improve community mobility.  (going in [employer] club with SPC) PT Short Term Goal 1 - Progress: Met PT Short Term Goal 2: Pt will improve his functional strength in order to go from sit to stand from a 20 inch surface without UE A in order to comfortably get up from chairs at church.   (able to go from STS from 20 in. not able to complete in church) PT Short Term Goal 2 - Progress: Met PT Long Term Goals Time to Complete Long Term Goals: 4 weeks PT Long Term Goal 1: Pt will improve his dynamic balance and demonstrate mod I gait with SPC in community x15 minutes in order to go shopping with his wife with greater ease.  PT Long Term Goal 1 - Progress: Met PT Long Term Goal 2: Pt will improve his LE strength to [facility] in order to begin driving again to improve independence.  PT Long Term Goal 2 - Progress: Met Long Term Goal 3: Pt will improve his LE strength to [facility] in order to demonstrate mild gait impairments to decrease risk of secondary injury.  Long Term Goal 3 Progress: Met Long Term Goal 4: Pt will improve his FOTO to limiation less than 37% for improved QOL.  Long Term Goal 4 Progress: Not met  Problem List Patient Active Problem List   Diagnosis Date Noted  . Unspecified constipation 03/31/2013  . Difficulty in walking(719.7) 03/12/2013  . Stiffness of right knee 03/12/2013  . Right leg weakness 03/12/2013  . Osteoarthritis of right knee 02/06/2013  . LBBB (left bundle branch block) 01/07/2013  . Preoperative cardiovascular examination 01/07/2013  . DM2 (diabetes mellitus, type 2)  01/07/2013  . Hypothyroidism 01/07/2013  . Morbid obesity 01/07/2013  . HTN (hypertension) 01/07/2013  . Dyslipidemia 01/07/2013       GP Functional Assessment Tool Used: FOTO: 51/49 (was 58/42) Functional Limitation: Mobility: Walking and moving around Mobility: Walking and Moving Around Goal Status [id number]): At least 20 percent but less than 40 percent impaired, limited or restricted Mobility: Walking and Moving Around Discharge Status [phone]): At least 20 percent but less than 40 percent impaired, limited or restricted  Rickey Rice, PTA/CLT 04/23/2013, 4:59 PM

## 2013-04-28 ENCOUNTER — Ambulatory Visit (HOSPITAL_COMMUNITY): Payer: Medicare Other | Admitting: Physical Therapy

## 2013-04-30 ENCOUNTER — Ambulatory Visit (HOSPITAL_COMMUNITY): Payer: Medicare Other | Admitting: Physical Therapy

## 2013-05-02 ENCOUNTER — Ambulatory Visit (HOSPITAL_COMMUNITY): Payer: Medicare Other | Admitting: Physical Therapy

## 2013-06-20 ENCOUNTER — Ambulatory Visit (INDEPENDENT_AMBULATORY_CARE_PROVIDER_SITE_OTHER): Payer: Medicare Other | Admitting: Internal Medicine

## 2013-06-20 ENCOUNTER — Encounter: Payer: Self-pay | Admitting: Internal Medicine

## 2013-06-20 VITALS — BP 138/72 | HR 74 | Ht 72.0 in | Wt 330.4 lb

## 2013-06-20 DIAGNOSIS — I447 Left bundle-branch block, unspecified: Secondary | ICD-10-CM

## 2013-06-20 DIAGNOSIS — I10 Essential (primary) hypertension: Secondary | ICD-10-CM

## 2013-06-20 DIAGNOSIS — E785 Hyperlipidemia, unspecified: Secondary | ICD-10-CM

## 2013-06-20 NOTE — Patient Instructions (Signed)
Your physician wants you to follow-up in:  6 months. You will receive a reminder letter in the mail two months in advance. If you don't receive a letter, please call our office to schedule the follow-up appointment.   

## 2013-06-20 NOTE — Progress Notes (Signed)
OFFICE NOTE  Chief Complaint:  Pre-operative evaluation  Primary Care Physician: Elyn Peers, MD  HPI:  Rickey Rice  is a 73 year old gentleman with a history of diabetes, hypertension, hypothyroidism, and morbid obesity. In 2010 he had complaints of chest pain and underwent stress testing with [facility] which was a 2-day nuclear stress test and was apparently negative. Recently he underwent a colonoscopy and a preoperative EKG was abnormal. I unfortunately do not have that EKG to review, but I did review the EKG from your office on April 09, 2012 which showed a borderline intraventricular conduction delay, a sinus rhythm at 66, and poor R-wave progression. Today in the office he has an abnormal EKG demonstrating a left bundle branch block with a QRS duration of 168 msec, heart rate of 78 in sinus. His only symptoms are shortness of breath with exertion. He underwent evaluation of his left bundle branch block in February 2014. This is a 2 day nuclear stress test which was negative for ischemia. There was a small inferior and apical defect which could represent scar versus artifact. EF was mildly reduced at 49%.    Rickey Rice returns today for followup. He underwent knee replacement surgery as we had authorized him to do. He is tolerated surgery without any complications. He reports that he is recovering fairly well and is more mobile than he had been in the past. He started to do some exercises at the [facility] and also this will translate into weight loss. Blood pressure has been stable he denies any chest pain.  He had blood work in January from his primary care provider which showed a total cholesterol of 128, HDL 51, triglycerides 53 and LDL 65. His hemoglobin A1c was 6.2.  PMHx:  Past Medical History  Diagnosis Date  . Diabetes mellitus without complication     type 2  . Hypertension   . Hypothyroidism   . Morbid obesity   . Dyslipidemia   . LBBB (left bundle branch  block)   . History of nuclear stress test 04/2012    lexiscan - 2 day protocol; low risk study, evidence of inferrior & apical scar but no ischemia   . Arthritis     Past Surgical History  Procedure Laterality Date  . Mouth surgery  1963  . Finger surgery  1954  . Colonoscopy w/ biopsies    . Knee arthroscopy Left   . Total knee arthroplasty Right 02/05/2013    Dr Mayer Camel  . Total knee arthroplasty Right 02/05/2013    Procedure: TOTAL KNEE ARTHROPLASTY;  Surgeon: Kerin Salen, MD;  Location: [place];  Service: Orthopedics;  Laterality: Right;    FAMHx:  Family History  Problem Relation Age of Onset  . Cancer Mother   . Kidney disease Brother   . Hypertension Sister     SOCHx:   reports that he has never smoked. He has never used smokeless tobacco. He reports that he does not drink alcohol or use illicit drugs.  ALLERGIES:  Allergies  Allergen Reactions  . Pioglitazone     Lips swelling  . Shrimp [Shellfish Allergy]     rash    ROS: A comprehensive review of systems was negative except for: Constitutional: positive for weight gain Respiratory: positive for dyspnea on exertion Musculoskeletal: positive for stiff joints and knee pain  HOME MEDS: Current Outpatient Prescriptions  Medication Sig Dispense Refill  . Aliskiren-Hydrochlorothiazide (TEKTURNA HCT) 300-25 MG TABS Take 1 tablet by mouth daily.      Marland Kitchen  amLODipine (NORVASC) 5 MG tablet Take 5 mg by mouth daily.      Marland Kitchen amoxicillin (AMOXIL) 500 MG capsule Take 500 mg by mouth as directed. Take 2 capsules 1 hour prior to dental procedures and 2 capsules 1 hour after      . aspirin 81 MG tablet Take 162 mg by mouth daily.      . celecoxib (CELEBREX) 200 MG capsule Take 200 mg by mouth daily.      Marland Kitchen glimepiride (AMARYL) 4 MG tablet Take 8 mg by mouth daily before breakfast.       . HYDROcodone-acetaminophen (NORCO/VICODIN) 5-325 MG per tablet as needed.      Marland Kitchen levothyroxine (SYNTHROID, LEVOTHROID) 75 MCG tablet Take 75  mcg by mouth daily before breakfast.      . LUTEIN ESTERS PO Take 1 capsule by mouth daily.      . metFORMIN (GLUCOPHAGE) 1000 MG tablet Take 1,000 mg by mouth daily with breakfast.      . oxyCODONE (OXY IR/ROXICODONE) 5 MG immediate release tablet Take 1 tablet (5 mg total) by mouth every 8 (eight) hours. For pain  90 tablet  0  . rosuvastatin (CRESTOR) 10 MG tablet Take 10 mg by mouth daily.      . saxagliptin HCl (ONGLYZA) 5 MG TABS tablet Take 5 mg by mouth daily.       No current facility-administered medications for this visit.    LABS/IMAGING: No results found for this or any previous visit (from the past 48 hour(s)). No results found.  VITALS: BP 138/72  Pulse 74  Ht 6' (1.829 m)  Wt 330 lb 6.4 oz (149.868 kg)  BMI 44.80 kg/m2  EXAM: General appearance: alert, no distress and morbidly obese Lungs: clear to auscultation bilaterally Heart: regular rate and rhythm, S1, S2 normal, no murmur, click, rub or gallop Extremities: extremities normal, atraumatic, no cyanosis or edema Pulses: 2+ and symmetric Skin: Skin color, texture, turgor normal. No rashes or lesions  EKG: Normal sinus rhythm, left bundle branch block pattern  ASSESSMENT: 1. Low to intermediate risk for upcoming surgery 2. Negative nuclear stress test for ischemia, fixed inferoapical defect suggesting possible scar versus artifact, EF 49% 3.   Diabetes type 2 on oral medications - A1c 6.2 4.   Hypertension-well controlled 5.   Dyslipidemia on statin - well controlled  PLAN: 1.   Rickey Rice did great with his knee replacement surgery. He is now started to ambulate more hopefully will work on exercise and to lose weight. His cholesterol is very well controlled. His blood pressure is at goal. His hemoglobin A1c is 6.2 which is pretty good. His EKG is stable with a left bundle branch block. He has no new symptoms. I've encouraged him to continue to work on exercise and weight loss which will help take some of  the pressure off his other knee, hopefully sparing it longer before he would have to have a second knee replacement.  Thanks for continuing to involve me in his care. Plan to see him back in 6 months.  Pixie Casino, MD, [facility] Attending Cardiologist [facility]  Kinzie Wickes C 06/20/2013, 12:59 PM

## 2013-08-20 ENCOUNTER — Other Ambulatory Visit (HOSPITAL_COMMUNITY): Payer: Self-pay | Admitting: Urology

## 2013-08-20 DIAGNOSIS — N400 Enlarged prostate without lower urinary tract symptoms: Secondary | ICD-10-CM

## 2013-08-20 DIAGNOSIS — N390 Urinary tract infection, site not specified: Secondary | ICD-10-CM

## 2013-08-25 ENCOUNTER — Ambulatory Visit (HOSPITAL_COMMUNITY)
Admission: RE | Admit: 2013-08-25 | Discharge: 2013-08-25 | Disposition: A | Discharge status: Home / Self Care | Payer: Medicare Other | Source: Ambulatory Visit | Attending: Urology | Admitting: Urology

## 2013-08-25 DIAGNOSIS — N390 Urinary tract infection, site not specified: Secondary | ICD-10-CM

## 2013-08-25 DIAGNOSIS — N400 Enlarged prostate without lower urinary tract symptoms: Secondary | ICD-10-CM

## 2013-08-25 DIAGNOSIS — Q619 Cystic kidney disease, unspecified: Secondary | ICD-10-CM | POA: Insufficient documentation

## 2013-09-07 ENCOUNTER — Emergency Department (HOSPITAL_COMMUNITY)
Admission: EM | Admit: 2013-09-07 | Discharge: 2013-09-07 | Disposition: A | Discharge status: Home / Self Care | Payer: Medicare Other | Attending: Emergency Medicine | Admitting: Emergency Medicine

## 2013-09-07 ENCOUNTER — Emergency Department (HOSPITAL_COMMUNITY): Payer: Medicare Other

## 2013-09-07 ENCOUNTER — Encounter (HOSPITAL_COMMUNITY): Payer: Self-pay | Admitting: Emergency Medicine

## 2013-09-07 DIAGNOSIS — Z79899 Other long term (current) drug therapy: Secondary | ICD-10-CM | POA: Insufficient documentation

## 2013-09-07 DIAGNOSIS — E785 Hyperlipidemia, unspecified: Secondary | ICD-10-CM | POA: Insufficient documentation

## 2013-09-07 DIAGNOSIS — E119 Type 2 diabetes mellitus without complications: Secondary | ICD-10-CM | POA: Insufficient documentation

## 2013-09-07 DIAGNOSIS — E039 Hypothyroidism, unspecified: Secondary | ICD-10-CM | POA: Insufficient documentation

## 2013-09-07 DIAGNOSIS — Z7982 Long term (current) use of aspirin: Secondary | ICD-10-CM | POA: Insufficient documentation

## 2013-09-07 DIAGNOSIS — M129 Arthropathy, unspecified: Secondary | ICD-10-CM | POA: Insufficient documentation

## 2013-09-07 DIAGNOSIS — M62838 Other muscle spasm: Secondary | ICD-10-CM

## 2013-09-07 DIAGNOSIS — Z791 Long term (current) use of non-steroidal anti-inflammatories (NSAID): Secondary | ICD-10-CM | POA: Insufficient documentation

## 2013-09-07 DIAGNOSIS — Z792 Long term (current) use of antibiotics: Secondary | ICD-10-CM | POA: Insufficient documentation

## 2013-09-07 DIAGNOSIS — I10 Essential (primary) hypertension: Secondary | ICD-10-CM | POA: Insufficient documentation

## 2013-09-07 LAB — CBC WITH DIFFERENTIAL/PLATELET
BASOS PCT: 0 % (ref 0–1)
Basophils Absolute: 0 10*3/uL (ref 0.0–0.1)
EOS PCT: 1 % (ref 0–5)
Eosinophils Absolute: 0.1 10*3/uL (ref 0.0–0.7)
HEMATOCRIT: 34.3 % — AB (ref 39.0–52.0)
Hemoglobin: 11.7 g/dL — ABNORMAL LOW (ref 13.0–17.0)
Lymphocytes Relative: 20 % (ref 12–46)
Lymphs Abs: 1.3 10*3/uL (ref 0.7–4.0)
MCH: 31.3 pg (ref 26.0–34.0)
MCHC: 34.1 g/dL (ref 30.0–36.0)
MCV: 91.7 fL (ref 78.0–100.0)
MONO ABS: 0.6 10*3/uL (ref 0.1–1.0)
Monocytes Relative: 9 % (ref 3–12)
NEUTROS ABS: 4.8 10*3/uL (ref 1.7–7.7)
Neutrophils Relative %: 70 % (ref 43–77)
Platelets: 188 10*3/uL (ref 150–400)
RBC: 3.74 MIL/uL — ABNORMAL LOW (ref 4.22–5.81)
RDW: 13.6 % (ref 11.5–15.5)
WBC: 6.9 10*3/uL (ref 4.0–10.5)

## 2013-09-07 LAB — BASIC METABOLIC PANEL
BUN: 16 mg/dL (ref 6–23)
CALCIUM: 10 mg/dL (ref 8.4–10.5)
CO2: 30 mEq/L (ref 19–32)
Chloride: 96 mEq/L (ref 96–112)
Creatinine, Ser: 1.03 mg/dL (ref 0.50–1.35)
GFR calc Af Amer: 81 mL/min — ABNORMAL LOW (ref >90)
GFR calc non Af Amer: 70 mL/min — ABNORMAL LOW (ref >90)
Glucose, Bld: 133 mg/dL — ABNORMAL HIGH (ref 70–99)
Potassium: 3.4 mEq/L — ABNORMAL LOW (ref 3.7–5.3)
Sodium: 139 mEq/L (ref 137–147)

## 2013-09-07 LAB — CK: CK TOTAL: 180 U/L (ref 7–232)

## 2013-09-07 LAB — TROPONIN I: Troponin I: <0.3 ng/mL (ref <0.30)

## 2013-09-07 MED ORDER — BISMUTH SUBSALICYLATE 262 MG/15ML PO SUSP
ORAL | Status: AC
  Filled 2013-09-07: qty 236

## 2013-09-07 MED ORDER — SODIUM CHLORIDE 0.9 % IV BOLUS (SEPSIS)
500.0000 mL | Freq: Once | INTRAVENOUS | Status: AC
  Administered 2013-09-07: 500 mL via INTRAVENOUS

## 2013-09-07 MED ORDER — IBUPROFEN 400 MG PO TABS
400.0000 mg | ORAL_TABLET | Freq: Once | ORAL | Status: AC
  Administered 2013-09-07: 400 mg via ORAL
  Filled 2013-09-07: qty 1

## 2013-09-07 MED ORDER — DIAZEPAM 5 MG PO TABS: 5.0000 mg | ORAL_TABLET | Freq: Two times a day (BID) | ORAL | Status: DC

## 2013-09-07 MED ORDER — BISMUTH SUBSALICYLATE 262 MG/15ML PO SUSP
30.0000 mL | Freq: Once | ORAL | Status: AC
  Administered 2013-09-07: 30 mL via ORAL
  Filled 2013-09-07: qty 236

## 2013-09-07 MED ORDER — DIAZEPAM 5 MG PO TABS
ORAL_TABLET | ORAL | Status: DC
Start: 2013-09-07 — End: 2013-09-07
  Filled 2013-09-07: qty 1

## 2013-09-07 MED ORDER — IOHEXOL 350 MG/ML SOLN
100.0000 mL | Freq: Once | INTRAVENOUS | Status: AC | PRN
Start: 2013-09-07 — End: 2013-09-07
  Administered 2013-09-07: 100 mL via INTRAVENOUS

## 2013-09-07 MED ORDER — DIAZEPAM 5 MG PO TABS
5.0000 mg | ORAL_TABLET | Freq: Once | ORAL | Status: AC
  Administered 2013-09-07: 5 mg via ORAL

## 2013-09-07 MED ORDER — METOCLOPRAMIDE HCL 10 MG PO TABS
10.0000 mg | ORAL_TABLET | Freq: Once | ORAL | Status: AC
  Administered 2013-09-07: 10 mg via ORAL
  Filled 2013-09-07: qty 1

## 2013-09-07 NOTE — ED Notes (Signed)
Another mild heat pack in place to lower neck at this time.

## 2013-09-07 NOTE — ED Provider Notes (Signed)
CSN: [id number]     Arrival date & time 09/07/13  0056 History   First MD Initiated Contact with Patient 09/07/13 0201     Chief Complaint  Patient presents with  . Torticollis     (Consider location/radiation/quality/duration/timing/severity/associated sxs/prior Treatment) HPI Comments: Pt comes in with cc of neck stiffness. Pt states that he woke up on Wednesday with a little stiffness - which was worse on Thursday, and got severe today. Pt has no hx of neck stiffness. He reports pain with any movement of the neck - in fact, he is not even moving his neck now, instead turning his entire body. Pt denies any trauma. Hr also denies any new meds. There is no numbness, tingling, weakness, visual complains, dizziness associated with his sx.  The history is provided by the patient.    Past Medical History  Diagnosis Date  . Diabetes mellitus without complication     type 2  . Hypertension   . Hypothyroidism   . Morbid obesity   . Dyslipidemia   . LBBB (left bundle branch block)   . History of nuclear stress test 04/2012    lexiscan - 2 day protocol; low risk study, evidence of inferrior & apical scar but no ischemia   . Arthritis    Past Surgical History  Procedure Laterality Date  . Mouth surgery  1963  . Finger surgery  1954  . Colonoscopy w/ biopsies    . Knee arthroscopy Left   . Total knee arthroplasty Right 02/05/2013    Dr Mayer Camel  . Total knee arthroplasty Right 02/05/2013    Procedure: TOTAL KNEE ARTHROPLASTY;  Surgeon: Kerin Salen, MD;  Location: [place];  Service: Orthopedics;  Laterality: Right;   Family History  Problem Relation Age of Onset  . Cancer Mother   . Kidney disease Brother   . Hypertension Sister    History  Substance Use Topics  . Smoking status: Never Smoker   . Smokeless tobacco: Never Used  . Alcohol Use: No    Review of Systems  Constitutional: Negative for fever, chills and activity change.  Eyes: Negative for visual disturbance.   Respiratory: Negative for cough, chest tightness and shortness of breath.   Cardiovascular: Negative for chest pain.  Gastrointestinal: Negative for nausea, vomiting, abdominal pain, constipation and abdominal distention.  Genitourinary: Negative for dysuria, enuresis and difficulty urinating.  Musculoskeletal: Positive for neck pain and neck stiffness. Negative for arthralgias.  Neurological: Negative for dizziness, tremors, facial asymmetry, weakness, light-headedness, numbness and headaches.  Psychiatric/Behavioral: Negative for confusion.      Allergies  Pioglitazone and Shrimp  Home Medications   Prior to Admission medications   Medication Sig Start Date End Date Taking? Authorizing Provider  acetaminophen (TYLENOL) 500 MG tablet Take 1,000 mg by mouth every 6 (six) hours as needed.   Yes Historical Provider, MD  Aliskiren-Hydrochlorothiazide (TEKTURNA HCT) 300-25 MG TABS Take 1 tablet by mouth daily.   Yes Historical Provider, MD  amLODipine (NORVASC) 5 MG tablet Take 5 mg by mouth daily.   Yes Historical Provider, MD  aspirin 81 MG tablet Take 162 mg by mouth daily.   Yes Historical Provider, MD  celecoxib (CELEBREX) 200 MG capsule Take 200 mg by mouth daily.   Yes Historical Provider, MD  glimepiride (AMARYL) 4 MG tablet Take 8 mg by mouth daily before breakfast.    Yes Historical Provider, MD  levothyroxine (SYNTHROID, LEVOTHROID) 75 MCG tablet Take 75 mcg by mouth daily before breakfast.  Yes Historical Provider, MD  LUTEIN ESTERS PO Take 1 capsule by mouth daily.   Yes Historical Provider, MD  metFORMIN (GLUCOPHAGE) 1000 MG tablet Take 1,000 mg by mouth daily with breakfast.   Yes Historical Provider, MD  oxyCODONE (OXY IR/ROXICODONE) 5 MG immediate release tablet Take 1 tablet (5 mg total) by mouth every 8 (eight) hours. For pain 02/18/13  Yes Mahima Pandey, MD  rosuvastatin (CRESTOR) 10 MG tablet Take 10 mg by mouth daily.   Yes Historical Provider, MD  saxagliptin HCl  (ONGLYZA) 5 MG TABS tablet Take 5 mg by mouth daily.   Yes Historical Provider, MD  amoxicillin (AMOXIL) 500 MG capsule Take 500 mg by mouth as directed. Take 2 capsules 1 hour prior to dental procedures and 2 capsules 1 hour after    Historical Provider, MD  diazepam (VALIUM) 5 MG tablet Take 1 tablet (5 mg total) by mouth 2 (two) times daily. 09/07/13   Varney Biles, MD  HYDROcodone-acetaminophen (NORCO/VICODIN) 5-325 MG per tablet as needed. 04/03/13   Historical Provider, MD   BP 149/91  Pulse 77  Temp(Src) 98.4 F (36.9 C) (Oral)  Resp 21  Ht 5\' 11"  (1.803 m)  Wt 325 lb (147.419 kg)  BMI 45.35 kg/m2  SpO2 96% Physical Exam  Nursing note and vitals reviewed. Constitutional: He is oriented to person, place, and time. He appears well-developed.  HENT:  Head: Normocephalic and atraumatic.  Eyes: Conjunctivae and EOM are normal. Pupils are equal, round, and reactive to light.  Neck: Normal range of motion. Neck supple.  Cardiovascular: Normal rate and regular rhythm.   Pulmonary/Chest: Effort normal and breath sounds normal.  Abdominal: Soft. Bowel sounds are normal. He exhibits no distension. There is no tenderness. There is no rebound and no guarding.  Musculoskeletal:  Pt's neck is stiff. The tenderness is reproduced with palpation of the para spinal muscles. No bruits.  Neurological: He is alert and oriented to person, place, and time. No cranial nerve deficit. Coordination normal.  Skin: Skin is warm.    ED Course  Procedures (including critical care time) Labs Review Labs Reviewed  BASIC METABOLIC PANEL - Abnormal; Notable for the following:    Potassium 3.4 (*)    Glucose, Bld 133 (*)    GFR calc non Af Amer 70 (*)    GFR calc Af Amer 81 (*)    All other components within normal limits  CBC WITH DIFFERENTIAL - Abnormal; Notable for the following:    RBC 3.74 (*)    Hemoglobin 11.7 (*)    HCT 34.3 (*)    All other components within normal limits  CK  TROPONIN I     Imaging Review Ct Angio Neck W/cm &/or Wo/cm  09/07/2013   CLINICAL DATA:  Evaluate for dissection.  EXAM: CT ANGIOGRAPHY NECK  TECHNIQUE: Multidetector CT imaging of the neck was performed using the standard protocol during bolus administration of intravenous contrast. Multiplanar CT image reconstructions and MIPs were obtained to evaluate the vascular anatomy. Carotid stenosis measurements (when applicable) are obtained utilizing NASCET criteria, using the distal internal carotid diameter as the denominator.  CONTRAST:  116mL OMNIPAQUE IOHEXOL 350 MG/ML SOLN  COMPARISON:  None available  FINDINGS: Study is somewhat limited due to patient body habitus.  The visualized intrathoracic aorta is of normal caliber with no 3 vessel morphology. No high-grade stenosis seen at the origin of the great vessels. Scattered atherosclerotic plaque present within the aortic arch.  Visualized subclavian arteries are well opacified  without acute abnormality.  The common carotid arteries are well opacified without evidence of high-grade stenosis, occlusion, or dissection. Few scattered atherosclerotic plaques present about the carotid bifurcations. The internal carotid arteries are mildly tortuous but widely patent without evidence of dissection, high-grade flow-limiting stenosis, or occlusion. Vascular calcifications present within the visualized carotid siphons.  The external carotid arteries and their branches are within normal limits.  Vertebral arteries both arise from the subclavian arteries. The proximal vertebral arteries are not well evaluated due to body habitus and motion artifact. Distally, the vertebral arteries are well opacified without evidence of dissection, high-grade flow-limiting stenosis, or occlusion. Prominent vascular calcifications seen within the V4 segments bilaterally without high-grade flow-limiting stenosis. Visualized vertebral junction and basilar artery are within normal limits.  Nose acute  soft tissue abnormality seen within the neck. No cervical adenopathy or mass lesion. Visualize air digestive tract is unremarkable.  Visualized lung apices are clear. 5 mm nodule present within the right upper lobe (series 609, image 18).  No acute osseous abnormality. Multilevel degenerative changes noted within the visualized spine. No worrisome lytic or blastic osseous lesions.  IMPRESSION: 1. No CTA evidence of dissection, high-grade flow-limiting stenosis, or other acute abnormality within the major arterial vasculature of the neck. 2. 5 mm right upper lobe pulmonary nodule. Given risk factors for bronchogenic carcinoma, follow-up chest CT at 6 - 12 months is recommended. This recommendation follows the consensus statement: Guidelines for Management of Small Pulmonary Nodules Detected on CT Scans: A Statement from the [place] as published in Radiology 2005;237:395-400.   Electronically Signed   By: Jeannine Boga M.D.   On: 09/07/2013 06:17     EKG Interpretation None      MDM   Final diagnoses:  Muscle spasms of neck    Pt comes in with cc of neck pain. Pt has no neuro deficits on exam - nor does he have any neuro complains.  Pt's sx are reproducible. Neck pain with stiffness - no trauma. CT angio neck ordered - no occipital head mass, no dissection. Pt felt slightly better with valium. Heat therapy advised. Neuro f/u requested.  Varney Biles, MD 09/07/13 [phone]

## 2013-09-07 NOTE — ED Notes (Signed)
Patient with no complaints at this time. Respirations even and unlabored. Skin warm/dry. Discharge instructions reviewed with patient at this time. Patient given opportunity to voice concerns/ask questions. IV removed per policy and band-aid applied to site. Patient discharged at this time and left Emergency Department with steady gait.  

## 2013-09-07 NOTE — ED Notes (Signed)
Pt states he woke Wednesday with a little stiffness that has gotten worse the past few days. Pt states been having indigestion also. Pt took robaxin & extra strength tylenol PTA.

## 2013-09-07 NOTE — ED Notes (Signed)
MD at bedside. 

## 2013-09-07 NOTE — ED Notes (Signed)
Medium heat pack applied to neck and upper back.

## 2013-09-07 NOTE — Discharge Instructions (Signed)
Muscle Cramps and Spasms Muscle cramps and spasms occur when a muscle or muscles tighten and you have no control over this tightening (involuntary muscle contraction). They are a common problem and can develop in any muscle. The most common place is in the calf muscles of the leg. Both muscle cramps and muscle spasms are involuntary muscle contractions, but they also have differences:   Muscle cramps are sporadic and painful. They may last a few seconds to a quarter of an hour. Muscle cramps are often more forceful and last longer than muscle spasms.  Muscle spasms may or may not be painful. They may also last just a few seconds or much longer. CAUSES  It is uncommon for cramps or spasms to be due to a serious underlying problem. In many cases, the cause of cramps or spasms is unknown. Some common causes are:   Overexertion.   Overuse from repetitive motions (doing the same thing over and over).   Remaining in a certain position for a long period of time.   Improper preparation, form, or technique while performing a sport or activity.   Dehydration.   Injury.   Side effects of some medicines.   Abnormally low levels of the salts and ions in your blood (electrolytes), especially potassium and calcium. This could happen if you are taking water pills (diuretics) or you are pregnant.  Some underlying medical problems can make it more likely to develop cramps or spasms. These include, but are not limited to:   Diabetes.   Parkinson disease.   Hormone disorders, such as thyroid problems.   Alcohol abuse.   Diseases specific to muscles, joints, and bones.   Blood vessel disease where not enough blood is getting to the muscles.  HOME CARE INSTRUCTIONS   Stay well hydrated. Drink enough water and fluids to keep your urine clear or pale yellow.  It may be helpful to massage, stretch, and relax the affected muscle.  For tight or tense muscles, use a warm towel, heating  pad, or hot shower water directed to the affected area.  If you are sore or have pain after a cramp or spasm, applying ice to the affected area may relieve discomfort.  Put ice in a plastic bag.  Place a towel between your skin and the bag.  Leave the ice on for 15-20 minutes, 03-04 times a day.  Medicines used to treat a known cause of cramps or spasms may help reduce their frequency or severity. Only take over-the-counter or prescription medicines as directed by your caregiver. SEEK MEDICAL CARE IF:  Your cramps or spasms get more severe, more frequent, or do not improve over time.  MAKE SURE YOU:   Understand these instructions.  Will watch your condition.  Will get help right away if you are not doing well or get worse. Document Released: 09/02/2001 Document Revised: 07/08/2012 Document Reviewed: 02/28/2012 [facility] Patient Information 2014 [place], [place].  Heat Therapy Heat therapy can help make painful, stiff muscles and joints feel better. Do not use heat on new injuries. Wait at least 48 hours after an injury to use heat. Do not use heat when you have aches or pains right after an activity. If you still have pain 3 hours after stopping the activity, then you may use heat. HOME CARE Wet heat pack  Soak a clean towel in warm water. Squeeze out the extra water.  Put the warm, wet towel in a plastic bag.  Place a thin, dry towel between your  skin and the bag.  Put the heat pack on the area for 5 minutes, and check your skin. Your skin may be pink, but it should not be red.  Leave the heat pack on the area for 15 to 30 minutes.  Repeat this every 2 to 4 hours while awake. Do not use heat while you are sleeping. Warm water bath  Fill a tub with warm water.  Place the affected body part in the tub.  Soak the area for 20 to 40 minutes.  Repeat as needed. Hot water bottle  Fill the water bottle half full with hot water.  Press out the extra air. Close the cap  tightly.  Place a dry towel between your skin and the bottle.  Put the bottle on the area for 5 minutes, and check your skin. Your skin may be pink, but it should not be red.  Leave the bottle on the area for 15 to 30 minutes.  Repeat this every 2 to 4 hours while awake. Electric heating pad  Place a dry towel between your skin and the heating pad.  Set the heating pad on low heat.  Put the heating pad on the area for 10 minutes, and check your skin. Your skin may be pink, but it should not be red.  Leave the heating pad on the area for 20 to 40 minutes.  Repeat this every 2 to 4 hours while awake.  Do not lie on the heating pad.  Do not fall asleep while using the heating pad.  Do not use the heating pad near water. GET HELP RIGHT AWAY IF:  You get blisters or red skin.  Your skin is puffy (swollen), or you lose feeling (numbness) in the affected area.  You have any new problems.  Your problems are getting worse.  You have any questions or concerns. If you have any problems, stop using heat therapy until you see your doctor. MAKE SURE YOU:  Understand these instructions.  Will watch your condition.  Will get help right away if you are not doing well or get worse. Document Released: 06/05/2011 Document Reviewed: 06/05/2011 [facility] Patient Information 2014 [place].

## 2013-11-14 ENCOUNTER — Other Ambulatory Visit: Payer: Self-pay | Admitting: Orthopedic Surgery

## 2013-12-11 ENCOUNTER — Ambulatory Visit (INDEPENDENT_AMBULATORY_CARE_PROVIDER_SITE_OTHER): Payer: Medicare Other | Admitting: Internal Medicine

## 2013-12-11 ENCOUNTER — Encounter: Payer: Self-pay | Admitting: Internal Medicine

## 2013-12-11 VITALS — BP 133/84 | HR 70 | Ht 71.0 in | Wt 333.3 lb

## 2013-12-11 DIAGNOSIS — E785 Hyperlipidemia, unspecified: Secondary | ICD-10-CM

## 2013-12-11 DIAGNOSIS — E119 Type 2 diabetes mellitus without complications: Secondary | ICD-10-CM

## 2013-12-11 DIAGNOSIS — I447 Left bundle-branch block, unspecified: Secondary | ICD-10-CM

## 2013-12-11 DIAGNOSIS — I10 Essential (primary) hypertension: Secondary | ICD-10-CM

## 2013-12-11 NOTE — Patient Instructions (Signed)
Your physician wants you to follow-up in:  6 months. You will receive a reminder letter in the mail two months in advance. If you don't receive a letter, please call our office to schedule the follow-up appointment.   

## 2013-12-11 NOTE — Progress Notes (Signed)
OFFICE NOTE  Chief Complaint:  Pre-operative evaluation  Primary Care Physician: Elyn Peers, MD  HPI:  Rickey Rice  is a 73 year old gentleman with a history of diabetes, hypertension, hypothyroidism, and morbid obesity. In 2010 he had complaints of chest pain and underwent stress testing with [facility] which was a 2-day nuclear stress test and was apparently negative. Recently he underwent a colonoscopy and a preoperative EKG was abnormal. I unfortunately do not have that EKG to review, but I did review the EKG from your office on April 09, 2012 which showed a borderline intraventricular conduction delay, a sinus rhythm at 66, and poor R-wave progression. Today in the office he has an abnormal EKG demonstrating a left bundle branch block with a QRS duration of 168 msec, heart rate of 78 in sinus. His only symptoms are shortness of breath with exertion. He underwent evaluation of his left bundle branch block in February 2014. This is a 2 day nuclear stress test which was negative for ischemia. There was a small inferior and apical defect which could represent scar versus artifact. EF was mildly reduced at 49%.    Rickey Rice returns today for followup. He underwent knee replacement surgery as we had authorized him to do. He is tolerated surgery without any complications. He reports that he is recovering fairly well and is more mobile than he had been in the past. He started to do some exercises at the [facility] and also this will translate into weight loss. Blood pressure has been stable he denies any chest pain.  He had blood work in January from his primary care provider which showed a total cholesterol of 128, HDL 51, triglycerides 53 and LDL 65. His hemoglobin A1c was 6.2.  Rickey Rice is now contemplating lethargy. Unfortunately has not been able to walk enough to lose weight in fact has gained some weight. 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 He feels like it's time to retire work on his generalized health. I would tend to agree with this.  PMHx:  Past Medical History  Diagnosis Date  . Diabetes mellitus without complication     type 2  . Hypertension   . Hypothyroidism   . Morbid obesity   . Dyslipidemia   . LBBB (left bundle branch block)   . History of nuclear stress test 04/2012    lexiscan - 2 day protocol; low risk study, evidence of inferrior & apical scar but no ischemia   . Arthritis     Past Surgical History  Procedure Laterality Date  . Mouth surgery  1963  . Finger surgery  1954  . Colonoscopy w/ biopsies    . Knee arthroscopy Left   . Total knee arthroplasty Right 02/05/2013    Dr Mayer Camel  . Total knee arthroplasty Right 02/05/2013    Procedure: TOTAL KNEE ARTHROPLASTY;  Surgeon: Kerin Salen, MD;  Location: [place];  Service: Orthopedics;  Laterality: Right;    FAMHx:  Family History  Problem Relation Age of Onset  . Cancer Mother   . Kidney disease Brother   . Hypertension Sister     SOCHx:   reports that he has never smoked. He has never used smokeless tobacco. He reports that he does not drink alcohol or use illicit drugs.  ALLERGIES:  Allergies  Allergen Reactions  . Pioglitazone     Lips swelling  . Shrimp [Shellfish Allergy]     rash  ROS: A comprehensive review of systems was negative except for: Constitutional: positive for weight gain Respiratory: positive for dyspnea on exertion Musculoskeletal: positive for stiff joints and knee pain  HOME MEDS: Current Outpatient Prescriptions  Medication Sig Dispense Refill  . acetaminophen (TYLENOL) 500 MG tablet Take 1,000 mg by mouth every 6 (six) hours as needed.      . Aliskiren-Hydrochlorothiazide (TEKTURNA HCT) 300-25 MG TABS Take 1 tablet by mouth daily.      Marland Kitchen amLODipine (NORVASC) 5 MG tablet Take 5 mg by mouth daily.      Marland Kitchen amoxicillin (AMOXIL) 500 MG capsule Take 500 mg by mouth as directed. Take 2 capsules 1  hour prior to dental procedures and 2 capsules 1 hour after      . aspirin 81 MG tablet Take 162 mg by mouth daily.      . celecoxib (CELEBREX) 200 MG capsule Take 200 mg by mouth daily.      Marland Kitchen glimepiride (AMARYL) 4 MG tablet Take 8 mg by mouth daily before breakfast.       . HYDROcodone-acetaminophen (NORCO/VICODIN) 5-325 MG per tablet as needed.      Marland Kitchen HYDROcodone-ibuprofen (VICOPROFEN) 7.5-200 MG per tablet as needed.      Marland Kitchen levothyroxine (SYNTHROID, LEVOTHROID) 75 MCG tablet Take 75 mcg by mouth daily before breakfast.      . LUTEIN ESTERS PO Take 1 capsule by mouth daily.      . metFORMIN (GLUCOPHAGE) 1000 MG tablet Take 1,000 mg by mouth daily with breakfast.      . rosuvastatin (CRESTOR) 10 MG tablet Take 10 mg by mouth daily.      . saxagliptin HCl (ONGLYZA) 5 MG TABS tablet Take 5 mg by mouth daily.      . tamsulosin (FLOMAX) 0.4 MG CAPS capsule Take 1 capsule by mouth daily.       No current facility-administered medications for this visit.    LABS/IMAGING: No results found for this or any previous visit (from the past 48 hour(s)). No results found.  VITALS: BP 133/84  Pulse 70  Ht 5\' 11"  (1.803 m)  Wt 333 lb 4.8 oz (151.184 kg)  BMI 46.51 kg/m2  EXAM: General appearance: alert, no distress and morbidly obese Lungs: clear to auscultation bilaterally Heart: regular rate and rhythm, S1, S2 normal, no murmur, click, rub or gallop Extremities: extremities normal, atraumatic, no cyanosis or edema Pulses: 2+ and symmetric Skin: Skin color, texture, turgor normal. No rashes or lesions  EKG: Normal sinus rhythm, left bundle branch block pattern  ASSESSMENT: 1. Low to intermediate risk for upcoming surgery 2. Negative nuclear stress test for ischemia, fixed inferoapical defect suggesting possible scar versus artifact, EF 49% 3.   Diabetes type 2 on oral medications - A1c 6.2 4.   Hypertension-well controlled 5.   Dyslipidemia on statin - well controlled  PLAN: 1.   Mr.  Rice did great with his knee replacement surgery. He is now contemplating surgery in his left knee. I have no hesitation for him to go forward with this. Blood pressures are well controlled. Unfortunately his weight continues to go up. He needs to work on his diet and any type of activities he can to help lose weight.  Thanks for continuing to involve me in his care. Plan to see him back in 6 months.  Pixie Casino, MD, [facility] Attending Cardiologist [facility]  Carmel Garfield C 12/11/2013, 2:37 PM

## 2014-01-08 ENCOUNTER — Encounter (HOSPITAL_COMMUNITY): Payer: Self-pay | Admitting: Pharmacy Technician

## 2014-01-09 ENCOUNTER — Encounter (HOSPITAL_COMMUNITY)
Admission: RE | Admit: 2014-01-09 | Discharge: 2014-01-09 | Disposition: A | Discharge status: Home / Self Care | Payer: Medicare Other | Source: Ambulatory Visit | Attending: Orthopedic Surgery | Admitting: Orthopedic Surgery

## 2014-01-09 ENCOUNTER — Encounter (HOSPITAL_COMMUNITY): Payer: Self-pay

## 2014-01-09 DIAGNOSIS — M179 Osteoarthritis of knee, unspecified: Secondary | ICD-10-CM | POA: Diagnosis not present

## 2014-01-09 DIAGNOSIS — Z01818 Encounter for other preprocedural examination: Secondary | ICD-10-CM | POA: Diagnosis not present

## 2014-01-09 HISTORY — DX: Benign prostatic hyperplasia without lower urinary tract symptoms: N40.0

## 2014-01-09 LAB — CBC WITH DIFFERENTIAL/PLATELET
BASOS PCT: 0 % (ref 0–1)
Basophils Absolute: 0 10*3/uL (ref 0.0–0.1)
Eosinophils Absolute: 0.1 10*3/uL (ref 0.0–0.7)
Eosinophils Relative: 3 % (ref 0–5)
HEMATOCRIT: 36 % — AB (ref 39.0–52.0)
Hemoglobin: 12.1 g/dL — ABNORMAL LOW (ref 13.0–17.0)
LYMPHS PCT: 30 % (ref 12–46)
Lymphs Abs: 1.4 10*3/uL (ref 0.7–4.0)
MCH: 30.9 pg (ref 26.0–34.0)
MCHC: 33.6 g/dL (ref 30.0–36.0)
MCV: 92.1 fL (ref 78.0–100.0)
MONO ABS: 0.5 10*3/uL (ref 0.1–1.0)
Monocytes Relative: 10 % (ref 3–12)
NEUTROS ABS: 2.7 10*3/uL (ref 1.7–7.7)
Neutrophils Relative %: 57 % (ref 43–77)
Platelets: 190 10*3/uL (ref 150–400)
RBC: 3.91 MIL/uL — ABNORMAL LOW (ref 4.22–5.81)
RDW: 13.4 % (ref 11.5–15.5)
WBC: 4.8 10*3/uL (ref 4.0–10.5)

## 2014-01-09 LAB — URINALYSIS, ROUTINE W REFLEX MICROSCOPIC
Bilirubin Urine: NEGATIVE
Glucose, UA: NEGATIVE mg/dL
HGB URINE DIPSTICK: NEGATIVE
KETONES UR: NEGATIVE mg/dL
Leukocytes, UA: NEGATIVE
Nitrite: NEGATIVE
Protein, ur: NEGATIVE mg/dL
Specific Gravity, Urine: 1.024 (ref 1.005–1.030)
UROBILINOGEN UA: 1 mg/dL (ref 0.0–1.0)
pH: 5.5 (ref 5.0–8.0)

## 2014-01-09 LAB — TYPE AND SCREEN
ABO/RH(D): O POS
ANTIBODY SCREEN: NEGATIVE

## 2014-01-09 LAB — PROTIME-INR
INR: 1.05 (ref 0.00–1.49)
PROTHROMBIN TIME: 13.8 s (ref 11.6–15.2)

## 2014-01-09 LAB — APTT: aPTT: 31 seconds (ref 24–37)

## 2014-01-09 LAB — BASIC METABOLIC PANEL
Anion gap: 11 (ref 5–15)
BUN: 15 mg/dL (ref 6–23)
CALCIUM: 9.1 mg/dL (ref 8.4–10.5)
CHLORIDE: 103 meq/L (ref 96–112)
CO2: 29 meq/L (ref 19–32)
Creatinine, Ser: 1.09 mg/dL (ref 0.50–1.35)
GFR calc Af Amer: 76 mL/min — ABNORMAL LOW (ref >90)
GFR calc non Af Amer: 65 mL/min — ABNORMAL LOW (ref >90)
Glucose, Bld: 160 mg/dL — ABNORMAL HIGH (ref 70–99)
Potassium: 3.7 mEq/L (ref 3.7–5.3)
SODIUM: 143 meq/L (ref 137–147)

## 2014-01-09 LAB — SURGICAL PCR SCREEN
MRSA, PCR: NEGATIVE
Staphylococcus aureus: NEGATIVE

## 2014-01-09 NOTE — Progress Notes (Signed)
01/09/14 1206  OBSTRUCTIVE SLEEP APNEA  Have you ever been diagnosed with sleep apnea through a sleep study? No  Do you snore loudly (loud enough to be heard through closed doors)?  0  Do you often feel tired, fatigued, or sleepy during the daytime? 1  Has anyone observed you stop breathing during your sleep? 1  Do you have, or are you being treated for high blood pressure? 1  BMI more than 35 kg/m2? 1  Age over 73 years old? 1  Neck circumference greater than 40 cm/16 inches? 1  Gender: 1  Obstructive Sleep Apnea Score 7  Score 4 or greater  Results sent to PCP

## 2014-01-09 NOTE — Progress Notes (Signed)
SPOKE WITH ALLISON WHO STATED AS LONG AS PATIENT HAS NOT HAD ANY NEW RESP. SYMPTOMS THAN WE DO NOT NEED TO REPEAT CXR.

## 2014-01-09 NOTE — Pre-Procedure Instructions (Signed)
GLEB MCGUIRE  01/09/2014   Your procedure is scheduled on:   Monday  01/19/14  Report to [facility] Admitting at 530 AM.  Call this number if you have problems the morning of surgery: [phone]   Remember:   Do not eat food or drink liquids after midnight.   Take these medicines the morning of surgery with A SIP OF WATER:  AMLODIPINE (NORVASC), HYDROCODONE IF NEEDED, LEVOTHYROXINE, TAMSULOSIN(FLOMAX)  (STOP ASPIRIN )   Do not wear jewelry, make-up or nail polish.  Do not wear lotions, powders, or perfumes. You may wear deodorant.  Do not shave 48 hours prior to surgery. Men may shave face and neck.  Do not bring valuables to the hospital.  [facility] is not responsible                  for any belongings or valuables.               Contacts, dentures or bridgework may not be worn into surgery.  Leave suitcase in the car. After surgery it may be brought to your room.  For patients admitted to the hospital, discharge time is determined by your                treatment team.               Patients discharged the day of surgery will not be allowed to drive  home.  Name and phone number of your driver:   Special Instructions:  Special Instructions:  - Preparing for Surgery  Before surgery, you can play an important role.  Because skin is not sterile, your skin needs to be as free of germs as possible.  You can reduce the number of germs on you skin by washing with CHG (chlorahexidine gluconate) soap before surgery.  CHG is an antiseptic cleaner which kills germs and bonds with the skin to continue killing germs even after washing.  Please DO NOT use if you have an allergy to CHG or antibacterial soaps.  If your skin becomes reddened/irritated stop using the CHG and inform your nurse when you arrive at Short Stay.  Do not shave (including legs and underarms) for at least 48 hours prior to the first CHG shower.  You may shave your face.  Please follow these  instructions carefully:   1.  Shower with CHG Soap the night before surgery and the morning of Surgery.  2.  If you choose to wash your hair, wash your hair first as usual with your normal shampoo.  3.  After you shampoo, rinse your hair and body thoroughly to remove the Shampoo.  4.  Use CHG as you would any other liquid soap. You can apply chg directly to the skin and wash gently with scrungie or a clean washcloth.  5.  Apply the CHG Soap to your body ONLY FROM THE NECK DOWN.  Do not use on open wounds or open sores.  Avoid contact with your eyes, ears, mouth and genitals (private parts).  Wash genitals (private parts with your normal soap.  6.  Wash thoroughly, paying special attention to the area where your surgery will be performed.  7.  Thoroughly rinse your body with warm water from the neck down.  8.  DO NOT shower/wash with your normal soap after using and rinsing off the CHG Soap.  9.  Pat yourself dry with a clean towel.  10.  Wear clean pajamas.            11.  Place clean sheets on your bed the night of your first shower and do not sleep with pets.  Day of Surgery  Do not apply any lotions/deodorants the morning of surgery.  Please wear clean clothes to the hospital/surgery center.   Please read over the following fact sheets that you were given: Pain Booklet, Coughing and Deep Breathing, Blood Transfusion Information, MRSA Information and Surgical Site Infection Prevention

## 2014-01-17 NOTE — H&P (Signed)
TOTAL KNEE ADMISSION H&P  Patient is being admitted for left total knee arthroplasty.  Subjective:  Chief Complaint:left knee pain.  HPI: Rickey Rice, 73 y.o. male, has a history of pain and functional disability in the left knee due to arthritis and has failed non-surgical conservative treatments for greater than 12 weeks to includeNSAID's and/or analgesics, corticosteriod injections, viscosupplementation injections, flexibility and strengthening excercises, supervised PT with diminished ADL's post treatment, use of assistive devices and activity modification.  Onset of symptoms was gradual, starting several years ago with gradually worsening course since that time. The patient noted no past surgery on the left knee(s).  Patient currently rates pain in the left knee(s) at 10 out of 10 with activity. Patient has night pain, worsening of pain with activity and weight bearing, pain that interferes with activities of daily living, pain with passive range of motion, crepitus and joint swelling.  Patient has evidence of periarticular osteophytes, joint subluxation and joint space narrowing by imaging studies. There is no active infection.  Patient Active Problem List   Diagnosis Date Noted  . Unspecified constipation 03/31/2013  . Difficulty in walking(719.7) 03/12/2013  . Stiffness of right knee 03/12/2013  . Right leg weakness 03/12/2013  . Osteoarthritis of right knee 02/06/2013  . LBBB (left bundle branch block) 01/07/2013  . Preoperative cardiovascular examination 01/07/2013  . DM2 (diabetes mellitus, type 2) 01/07/2013  . Hypothyroidism 01/07/2013  . Morbid obesity 01/07/2013  . HTN (hypertension) 01/07/2013  . Dyslipidemia 01/07/2013   Past Medical History  Diagnosis Date  . Diabetes mellitus without complication     type 2  . Hypertension   . Hypothyroidism   . Morbid obesity   . Dyslipidemia   . LBBB (left bundle branch block)   . History of nuclear stress test 04/2012     lexiscan - 2 day protocol; low risk study, evidence of inferrior & apical scar but no ischemia   . Arthritis   . Shortness of breath     with exertion   . Prostate enlargement     Past Surgical History  Procedure Laterality Date  . Mouth surgery  1963  . Finger surgery  1954  . Colonoscopy w/ biopsies    . Knee arthroscopy Left   . Total knee arthroplasty Right 02/05/2013    Dr Mayer Camel  . Total knee arthroplasty Right 02/05/2013    Procedure: TOTAL KNEE ARTHROPLASTY;  Surgeon: Kerin Salen, MD;  Location: [place];  Service: Orthopedics;  Laterality: Right;    No prescriptions prior to admission   Allergies  Allergen Reactions  . Pioglitazone     Lips swelling  . Shrimp [Shellfish Allergy]     rash    History  Substance Use Topics  . Smoking status: Never Smoker   . Smokeless tobacco: Never Used  . Alcohol Use: No    Family History  Problem Relation Age of Onset  . Cancer Mother   . Kidney disease Brother   . Hypertension Sister      Review of Systems  Constitutional: Positive for weight loss.  HENT: Negative.   Eyes:       Glasses  Respiratory: Negative.   Cardiovascular: Negative.   Gastrointestinal: Negative.   Musculoskeletal: Positive for joint pain.  Skin: Negative.   Neurological: Negative.   Endo/Heme/Allergies: Negative.   Psychiatric/Behavioral: Negative.     Objective:  Physical Exam  Constitutional: He is oriented to person, place, and time. He appears well-developed and well-nourished.  HENT:  Head: Normocephalic and atraumatic.  Eyes: Pupils are equal, round, and reactive to light.  Neck: Normal range of motion. Neck supple.  Cardiovascular: Intact distal pulses.   Respiratory: Effort normal.  Musculoskeletal: He exhibits tenderness.  Patient's right knee has good strength and good range of motion.  He has a range from 5 to approximately 110.  No noticeable instability.  His calves are soft and nontender.  Patient's left knee does have  moderate medial joint line tenderness.  No noticeable swelling and no erythema or warmth.  Again he has a range from approximately 5 to 110.  No noticeable laxity.  Neurological: He is alert and oriented to person, place, and time.  Skin: Skin is warm and dry.  Psychiatric: He has a normal mood and affect. His behavior is normal. Judgment and thought content normal.    Vital signs in last 24 hours:    Labs:   Estimated body mass index is 45.35 kg/(m^2) as calculated from the following:   Height as of 09/07/13: 5\' 11"  (1.803 m).   Weight as of 09/07/13: 147.419 kg (325 lb).   Imaging Review Plain radiographs demonstrate End-stage arthritis, bilateral knees, right worse than left, medial compartment erosion.  1-2 mm.  Assessment/Plan:  End stage arthritis, left knee   The patient history, physical examination, clinical judgment of the provider and imaging studies are consistent with end stage degenerative joint disease of the left knee(s) and total knee arthroplasty is deemed medically necessary. The treatment options including medical management, injection therapy arthroscopy and arthroplasty were discussed at length. The risks and benefits of total knee arthroplasty were presented and reviewed. The risks due to aseptic loosening, infection, stiffness, patella tracking problems, thromboembolic complications and other imponderables were discussed. The patient acknowledged the explanation, agreed to proceed with the plan and consent was signed. Patient is being admitted for inpatient treatment for surgery, pain control, PT, OT, prophylactic antibiotics, VTE prophylaxis, progressive ambulation and ADL's and discharge planning. The patient is planning to be discharged to skilled nursing facility

## 2014-01-18 MED ORDER — DEXTROSE 5 % IV SOLN
3.0000 g | INTRAVENOUS | Status: AC
  Administered 2014-01-19: 3 g via INTRAVENOUS
  Filled 2014-01-18: qty 3000

## 2014-01-18 MED ORDER — CHLORHEXIDINE GLUCONATE 4 % EX LIQD
60.0000 mL | Freq: Once | CUTANEOUS | Status: DC
  Filled 2014-01-18: qty 60

## 2014-01-19 ENCOUNTER — Encounter (HOSPITAL_COMMUNITY): Payer: Medicare Other | Admitting: Vascular Surgery

## 2014-01-19 ENCOUNTER — Inpatient Hospital Stay (HOSPITAL_COMMUNITY): Payer: Medicare Other | Admitting: Anesthesiology

## 2014-01-19 ENCOUNTER — Encounter (HOSPITAL_COMMUNITY)
Admission: RE | Disposition: A | Discharge status: Skilled Nursing Facility | Payer: Self-pay | Source: Ambulatory Visit | Attending: Orthopedic Surgery

## 2014-01-19 ENCOUNTER — Inpatient Hospital Stay (HOSPITAL_COMMUNITY)
Admission: RE | Admit: 2014-01-19 | Discharge: 2014-01-21 | DRG: 470 | Disposition: A | Discharge status: Skilled Nursing Facility | Payer: Medicare Other | Source: Ambulatory Visit | Attending: Orthopedic Surgery | Admitting: Orthopedic Surgery

## 2014-01-19 ENCOUNTER — Encounter (HOSPITAL_COMMUNITY): Payer: Self-pay | Admitting: Surgery

## 2014-01-19 DIAGNOSIS — E119 Type 2 diabetes mellitus without complications: Secondary | ICD-10-CM | POA: Diagnosis present

## 2014-01-19 DIAGNOSIS — Z87891 Personal history of nicotine dependence: Secondary | ICD-10-CM

## 2014-01-19 DIAGNOSIS — I10 Essential (primary) hypertension: Secondary | ICD-10-CM | POA: Diagnosis present

## 2014-01-19 DIAGNOSIS — E785 Hyperlipidemia, unspecified: Secondary | ICD-10-CM | POA: Diagnosis present

## 2014-01-19 DIAGNOSIS — Z7982 Long term (current) use of aspirin: Secondary | ICD-10-CM | POA: Diagnosis not present

## 2014-01-19 DIAGNOSIS — Z6841 Body Mass Index (BMI) 40.0 and over, adult: Secondary | ICD-10-CM

## 2014-01-19 DIAGNOSIS — E039 Hypothyroidism, unspecified: Secondary | ICD-10-CM | POA: Diagnosis present

## 2014-01-19 DIAGNOSIS — M1712 Unilateral primary osteoarthritis, left knee: Principal | ICD-10-CM | POA: Diagnosis present

## 2014-01-19 DIAGNOSIS — R509 Fever, unspecified: Secondary | ICD-10-CM

## 2014-01-19 DIAGNOSIS — M25562 Pain in left knee: Secondary | ICD-10-CM | POA: Diagnosis present

## 2014-01-19 HISTORY — PX: TOTAL KNEE ARTHROPLASTY: SHX125

## 2014-01-19 LAB — GLUCOSE, CAPILLARY
GLUCOSE-CAPILLARY: 151 mg/dL — AB (ref 70–99)
GLUCOSE-CAPILLARY: 188 mg/dL — AB (ref 70–99)
GLUCOSE-CAPILLARY: 122 mg/dL — AB (ref 70–99)
Glucose-Capillary: 99 mg/dL (ref 70–99)
Glucose-Capillary: 173 mg/dL — ABNORMAL HIGH (ref 70–99)

## 2014-01-19 LAB — HEMOGLOBIN A1C
Hgb A1c MFr Bld: 6.5 % — ABNORMAL HIGH (ref <5.7)
Mean Plasma Glucose: 140 mg/dL — ABNORMAL HIGH (ref <117)

## 2014-01-19 SURGERY — ARTHROPLASTY, KNEE, TOTAL
Anesthesia: Regional | Site: Knee | Laterality: Left

## 2014-01-19 MED ORDER — SODIUM CHLORIDE 0.9 % IJ SOLN
INTRAMUSCULAR | Status: DC | PRN
Start: 2014-01-19 — End: 2014-01-19
  Administered 2014-01-19: 40 mL

## 2014-01-19 MED ORDER — METOCLOPRAMIDE HCL 5 MG/ML IJ SOLN: 5.0000 mg | Freq: Three times a day (TID) | INTRAMUSCULAR | Status: DC | PRN

## 2014-01-19 MED ORDER — HYDROMORPHONE HCL 1 MG/ML IJ SOLN
INTRAMUSCULAR | Status: AC
  Filled 2014-01-19: qty 1

## 2014-01-19 MED ORDER — LACTATED RINGERS IV SOLN: INTRAVENOUS | Status: DC

## 2014-01-19 MED ORDER — ASPIRIN EC 325 MG PO TBEC
325.0000 mg | DELAYED_RELEASE_TABLET | Freq: Every day | ORAL | Status: DC
  Administered 2014-01-20 – 2014-01-21 (×2): 325 mg via ORAL
  Filled 2014-01-19 (×3): qty 1

## 2014-01-19 MED ORDER — SENNOSIDES-DOCUSATE SODIUM 8.6-50 MG PO TABS: 1.0000 | ORAL_TABLET | Freq: Every evening | ORAL | Status: DC | PRN

## 2014-01-19 MED ORDER — OXYCODONE HCL 5 MG/5ML PO SOLN: 5.0000 mg | Freq: Once | ORAL | Status: DC | PRN

## 2014-01-19 MED ORDER — INSULIN ASPART 100 UNIT/ML ~~LOC~~ SOLN
0.0000 [IU] | Freq: Three times a day (TID) | SUBCUTANEOUS | Status: DC
  Administered 2014-01-19: 3 [IU] via SUBCUTANEOUS
  Administered 2014-01-20 (×2): 2 [IU] via SUBCUTANEOUS
  Administered 2014-01-20: 3 [IU] via SUBCUTANEOUS
  Administered 2014-01-21 (×2): 2 [IU] via SUBCUTANEOUS

## 2014-01-19 MED ORDER — OXYCODONE HCL 5 MG PO TABS
5.0000 mg | ORAL_TABLET | ORAL | Status: DC | PRN
  Administered 2014-01-19 – 2014-01-21 (×11): 10 mg via ORAL
  Filled 2014-01-19 (×10): qty 2

## 2014-01-19 MED ORDER — PROPOFOL 10 MG/ML IV BOLUS
INTRAVENOUS | Status: AC
  Filled 2014-01-19: qty 20

## 2014-01-19 MED ORDER — EPHEDRINE SULFATE 50 MG/ML IJ SOLN
INTRAMUSCULAR | Status: AC
  Filled 2014-01-19: qty 1

## 2014-01-19 MED ORDER — DOCUSATE SODIUM 100 MG PO CAPS
100.0000 mg | ORAL_CAPSULE | Freq: Two times a day (BID) | ORAL | Status: DC
  Administered 2014-01-19 – 2014-01-21 (×5): 100 mg via ORAL
  Filled 2014-01-19 (×5): qty 1

## 2014-01-19 MED ORDER — LIDOCAINE HCL (CARDIAC) 20 MG/ML IV SOLN
INTRAVENOUS | Status: AC
  Filled 2014-01-19: qty 5

## 2014-01-19 MED ORDER — MENTHOL 3 MG MT LOZG: 1.0000 | LOZENGE | OROMUCOSAL | Status: DC | PRN

## 2014-01-19 MED ORDER — FENTANYL CITRATE 0.05 MG/ML IJ SOLN
INTRAMUSCULAR | Status: AC
  Filled 2014-01-19: qty 5

## 2014-01-19 MED ORDER — AMLODIPINE BESYLATE 5 MG PO TABS
5.0000 mg | ORAL_TABLET | Freq: Every day | ORAL | Status: DC
  Administered 2014-01-21: 5 mg via ORAL
  Filled 2014-01-19 (×3): qty 1

## 2014-01-19 MED ORDER — HYDROCHLOROTHIAZIDE 25 MG PO TABS
25.0000 mg | ORAL_TABLET | Freq: Every day | ORAL | Status: DC
  Administered 2014-01-21: 25 mg via ORAL
  Filled 2014-01-19 (×3): qty 1

## 2014-01-19 MED ORDER — TIZANIDINE HCL 2 MG PO CAPS: 2.0000 mg | ORAL_CAPSULE | Freq: Three times a day (TID) | ORAL | Status: DC

## 2014-01-19 MED ORDER — METOCLOPRAMIDE HCL 10 MG PO TABS: 5.0000 mg | ORAL_TABLET | Freq: Three times a day (TID) | ORAL | Status: DC | PRN

## 2014-01-19 MED ORDER — BUPIVACAINE-EPINEPHRINE 0.25% -1:200000 IJ SOLN
INTRAMUSCULAR | Status: DC | PRN
  Administered 2014-01-19: 8 mL

## 2014-01-19 MED ORDER — BUPIVACAINE-EPINEPHRINE (PF) 0.5% -1:200000 IJ SOLN
INTRAMUSCULAR | Status: DC | PRN
  Administered 2014-01-19: 30 mL via PERINEURAL

## 2014-01-19 MED ORDER — ALISKIREN FUMARATE 150 MG PO TABS
300.0000 mg | ORAL_TABLET | Freq: Every day | ORAL | Status: DC
  Administered 2014-01-21: 300 mg via ORAL
  Filled 2014-01-19 (×3): qty 2

## 2014-01-19 MED ORDER — METHOCARBAMOL 1000 MG/10ML IJ SOLN
500.0000 mg | INTRAVENOUS | Status: AC
  Filled 2014-01-19: qty 5

## 2014-01-19 MED ORDER — BISACODYL 5 MG PO TBEC: 5.0000 mg | DELAYED_RELEASE_TABLET | Freq: Every day | ORAL | Status: DC | PRN

## 2014-01-19 MED ORDER — BUPIVACAINE LIPOSOME 1.3 % IJ SUSP
INTRAMUSCULAR | Status: DC | PRN
  Administered 2014-01-19: 20 mL

## 2014-01-19 MED ORDER — METHOCARBAMOL 500 MG PO TABS
500.0000 mg | ORAL_TABLET | Freq: Four times a day (QID) | ORAL | Status: DC | PRN
  Administered 2014-01-19 – 2014-01-21 (×4): 500 mg via ORAL
  Filled 2014-01-19 (×4): qty 1

## 2014-01-19 MED ORDER — EPHEDRINE SULFATE 50 MG/ML IJ SOLN
INTRAMUSCULAR | Status: DC | PRN
  Administered 2014-01-19: 10 mg via INTRAVENOUS

## 2014-01-19 MED ORDER — ACETAMINOPHEN 650 MG RE SUPP: 650.0000 mg | Freq: Four times a day (QID) | RECTAL | Status: DC | PRN

## 2014-01-19 MED ORDER — ROSUVASTATIN CALCIUM 10 MG PO TABS
10.0000 mg | ORAL_TABLET | Freq: Every day | ORAL | Status: DC
  Administered 2014-01-19 – 2014-01-21 (×3): 10 mg via ORAL
  Filled 2014-01-19 (×3): qty 1

## 2014-01-19 MED ORDER — LEVOTHYROXINE SODIUM 75 MCG PO TABS
75.0000 ug | ORAL_TABLET | Freq: Every day | ORAL | Status: DC
  Administered 2014-01-20 – 2014-01-21 (×2): 75 ug via ORAL
  Filled 2014-01-19 (×3): qty 1

## 2014-01-19 MED ORDER — GLIMEPIRIDE 4 MG PO TABS
8.0000 mg | ORAL_TABLET | Freq: Every day | ORAL | Status: DC
  Administered 2014-01-20 – 2014-01-21 (×2): 8 mg via ORAL
  Filled 2014-01-19 (×3): qty 2

## 2014-01-19 MED ORDER — TRANEXAMIC ACID 100 MG/ML IV SOLN
1000.0000 mg | INTRAVENOUS | Status: AC
  Administered 2014-01-19: 1000 mg via INTRAVENOUS
  Filled 2014-01-19: qty 10

## 2014-01-19 MED ORDER — KCL IN DEXTROSE-NACL 20-5-0.45 MEQ/L-%-% IV SOLN
INTRAVENOUS | Status: AC
  Filled 2014-01-19: qty 1000

## 2014-01-19 MED ORDER — ONDANSETRON HCL 4 MG/2ML IJ SOLN
INTRAMUSCULAR | Status: AC
  Filled 2014-01-19: qty 2

## 2014-01-19 MED ORDER — MIDAZOLAM HCL 2 MG/2ML IJ SOLN: 0.5000 mg | Freq: Once | INTRAMUSCULAR | Status: DC | PRN

## 2014-01-19 MED ORDER — OXYCODONE HCL 5 MG PO TABS: 5.0000 mg | ORAL_TABLET | Freq: Once | ORAL | Status: DC | PRN

## 2014-01-19 MED ORDER — ALUM & MAG HYDROXIDE-SIMETH 200-200-20 MG/5ML PO SUSP: 30.0000 mL | ORAL | Status: DC | PRN

## 2014-01-19 MED ORDER — OXYCODONE HCL 5 MG PO TABS
ORAL_TABLET | ORAL | Status: AC
  Filled 2014-01-19: qty 2

## 2014-01-19 MED ORDER — LINAGLIPTIN 5 MG PO TABS
5.0000 mg | ORAL_TABLET | Freq: Every day | ORAL | Status: DC
  Administered 2014-01-19 – 2014-01-21 (×3): 5 mg via ORAL
  Filled 2014-01-19 (×3): qty 1

## 2014-01-19 MED ORDER — ALISKIREN-HYDROCHLOROTHIAZIDE 300-25 MG PO TABS: 1.0000 | ORAL_TABLET | Freq: Every day | ORAL | Status: DC

## 2014-01-19 MED ORDER — BUPIVACAINE LIPOSOME 1.3 % IJ SUSP
20.0000 mL | Freq: Once | INTRAMUSCULAR | Status: DC
  Filled 2014-01-19: qty 20

## 2014-01-19 MED ORDER — SODIUM CHLORIDE 0.9 % IJ SOLN
INTRAMUSCULAR | Status: AC
  Filled 2014-01-19: qty 10

## 2014-01-19 MED ORDER — ONDANSETRON HCL 4 MG/2ML IJ SOLN: 4.0000 mg | Freq: Four times a day (QID) | INTRAMUSCULAR | Status: DC | PRN

## 2014-01-19 MED ORDER — LIDOCAINE HCL (CARDIAC) 20 MG/ML IV SOLN
INTRAVENOUS | Status: DC | PRN
  Administered 2014-01-19: 50 mg via INTRAVENOUS

## 2014-01-19 MED ORDER — OXYCODONE-ACETAMINOPHEN 5-325 MG PO TABS: 1.0000 | ORAL_TABLET | ORAL | Status: DC | PRN

## 2014-01-19 MED ORDER — METHOCARBAMOL 1000 MG/10ML IJ SOLN
500.0000 mg | Freq: Four times a day (QID) | INTRAVENOUS | Status: DC | PRN
  Filled 2014-01-19: qty 5

## 2014-01-19 MED ORDER — MIDAZOLAM HCL 2 MG/2ML IJ SOLN
INTRAMUSCULAR | Status: AC
  Filled 2014-01-19: qty 2

## 2014-01-19 MED ORDER — PROMETHAZINE HCL 25 MG/ML IJ SOLN: 6.2500 mg | INTRAMUSCULAR | Status: DC | PRN

## 2014-01-19 MED ORDER — ONDANSETRON HCL 4 MG/2ML IJ SOLN
INTRAMUSCULAR | Status: DC | PRN
  Administered 2014-01-19: 4 mg via INTRAVENOUS

## 2014-01-19 MED ORDER — MAGNESIUM CITRATE PO SOLN: 1.0000 | Freq: Once | ORAL | Status: AC | PRN

## 2014-01-19 MED ORDER — LACTATED RINGERS IV SOLN
INTRAVENOUS | Status: DC | PRN
Start: 2014-01-19 — End: 2014-01-19
  Administered 2014-01-19 (×2): via INTRAVENOUS

## 2014-01-19 MED ORDER — PHENOL 1.4 % MT LIQD: 1.0000 | OROMUCOSAL | Status: DC | PRN

## 2014-01-19 MED ORDER — PROPOFOL 10 MG/ML IV BOLUS
INTRAVENOUS | Status: DC | PRN
  Administered 2014-01-19: 200 mg via INTRAVENOUS
  Administered 2014-01-19: 100 mg via INTRAVENOUS

## 2014-01-19 MED ORDER — MIDAZOLAM HCL 5 MG/5ML IJ SOLN
INTRAMUSCULAR | Status: DC | PRN
  Administered 2014-01-19 (×2): 1 mg via INTRAVENOUS

## 2014-01-19 MED ORDER — TAMSULOSIN HCL 0.4 MG PO CAPS
0.4000 mg | ORAL_CAPSULE | Freq: Every day | ORAL | Status: DC
  Administered 2014-01-20 – 2014-01-21 (×2): 0.4 mg via ORAL
  Filled 2014-01-19 (×3): qty 1

## 2014-01-19 MED ORDER — ACETAMINOPHEN 325 MG PO TABS
650.0000 mg | ORAL_TABLET | Freq: Four times a day (QID) | ORAL | Status: DC | PRN
Start: 2014-01-19 — End: 2014-01-21
  Administered 2014-01-20: 650 mg via ORAL
  Filled 2014-01-19 (×2): qty 2

## 2014-01-19 MED ORDER — PHENYLEPHRINE 40 MCG/ML (10ML) SYRINGE FOR IV PUSH (FOR BLOOD PRESSURE SUPPORT)
PREFILLED_SYRINGE | INTRAVENOUS | Status: AC
  Filled 2014-01-19: qty 10

## 2014-01-19 MED ORDER — BUPIVACAINE-EPINEPHRINE (PF) 0.25% -1:200000 IJ SOLN
INTRAMUSCULAR | Status: AC
  Filled 2014-01-19: qty 30

## 2014-01-19 MED ORDER — SODIUM CHLORIDE 0.9 % IR SOLN
Status: DC | PRN
Start: 2014-01-19 — End: 2014-01-19
  Administered 2014-01-19: 2000 mL

## 2014-01-19 MED ORDER — HYDROMORPHONE HCL 1 MG/ML IJ SOLN
1.0000 mg | INTRAMUSCULAR | Status: DC | PRN
  Administered 2014-01-19 – 2014-01-20 (×6): 1 mg via INTRAVENOUS
  Filled 2014-01-19 (×6): qty 1

## 2014-01-19 MED ORDER — FENTANYL CITRATE 0.05 MG/ML IJ SOLN
INTRAMUSCULAR | Status: DC | PRN
  Administered 2014-01-19 (×8): 50 ug via INTRAVENOUS
  Administered 2014-01-19: 100 ug via INTRAVENOUS

## 2014-01-19 MED ORDER — ONDANSETRON HCL 4 MG PO TABS: 4.0000 mg | ORAL_TABLET | Freq: Four times a day (QID) | ORAL | Status: DC | PRN

## 2014-01-19 MED ORDER — DIPHENHYDRAMINE HCL 12.5 MG/5ML PO ELIX
12.5000 mg | ORAL_SOLUTION | ORAL | Status: DC | PRN
Start: 2014-01-19 — End: 2014-01-21

## 2014-01-19 MED ORDER — PHENYLEPHRINE HCL 10 MG/ML IJ SOLN
INTRAMUSCULAR | Status: DC | PRN
  Administered 2014-01-19 (×4): 80 ug via INTRAVENOUS

## 2014-01-19 MED ORDER — KCL IN DEXTROSE-NACL 20-5-0.45 MEQ/L-%-% IV SOLN
INTRAVENOUS | Status: DC
  Administered 2014-01-19: 19:00:00 via INTRAVENOUS
  Filled 2014-01-19 (×9): qty 1000

## 2014-01-19 MED ORDER — ASPIRIN EC 325 MG PO TBEC: 325.0000 mg | DELAYED_RELEASE_TABLET | Freq: Two times a day (BID) | ORAL | Status: DC

## 2014-01-19 MED ORDER — ROCURONIUM BROMIDE 50 MG/5ML IV SOLN
INTRAVENOUS | Status: AC
  Filled 2014-01-19: qty 1

## 2014-01-19 MED ORDER — HYDROMORPHONE HCL 1 MG/ML IJ SOLN
0.2500 mg | INTRAMUSCULAR | Status: DC | PRN
  Administered 2014-01-19: 0.25 mg via INTRAVENOUS
  Administered 2014-01-19 (×2): 0.5 mg via INTRAVENOUS
  Administered 2014-01-19: 0.25 mg via INTRAVENOUS
  Administered 2014-01-19: 0.5 mg via INTRAVENOUS

## 2014-01-19 MED ORDER — DIPHENHYDRAMINE HCL 50 MG/ML IJ SOLN
10.0000 mg | Freq: Once | INTRAMUSCULAR | Status: AC
  Administered 2014-01-19: 12.5 mg via INTRAVENOUS

## 2014-01-19 MED ORDER — METFORMIN HCL 500 MG PO TABS
1000.0000 mg | ORAL_TABLET | Freq: Every day | ORAL | Status: DC
  Administered 2014-01-20 – 2014-01-21 (×2): 1000 mg via ORAL
  Filled 2014-01-19 (×3): qty 2

## 2014-01-19 MED ORDER — MEPERIDINE HCL 25 MG/ML IJ SOLN: 6.2500 mg | INTRAMUSCULAR | Status: DC | PRN

## 2014-01-19 SURGICAL SUPPLY — 64 items
BANDAGE ESMARK 6X9 LF (GAUZE/BANDAGES/DRESSINGS) ×1 IMPLANT
BLADE SAG 18X100X1.27 (BLADE) ×3 IMPLANT
BLADE SAW SGTL 13X75X1.27 (BLADE) ×3 IMPLANT
BLADE SURG ROTATE 9660 (MISCELLANEOUS) IMPLANT
BNDG ELASTIC 6X10 VLCR STRL LF (GAUZE/BANDAGES/DRESSINGS) ×3 IMPLANT
BNDG ESMARK 6X9 LF (GAUZE/BANDAGES/DRESSINGS) ×3
BNDG GAUZE ELAST 4 BULKY (GAUZE/BANDAGES/DRESSINGS) ×3 IMPLANT
BOWL SMART MIX CTS (DISPOSABLE) ×3 IMPLANT
CAPT RP KNEE ×3 IMPLANT
CEMENT HV SMART SET (Cement) ×6 IMPLANT
COVER SURGICAL LIGHT HANDLE (MISCELLANEOUS) ×3 IMPLANT
CUFF TOURNIQUET SINGLE 34IN LL (TOURNIQUET CUFF) IMPLANT
CUFF TOURNIQUET SINGLE 44IN (TOURNIQUET CUFF) ×3 IMPLANT
DRAPE EXTREMITY T 121X128X90 (DRAPE) ×3 IMPLANT
DRAPE U-SHAPE 47X51 STRL (DRAPES) ×3 IMPLANT
DURAPREP 26ML APPLICATOR (WOUND CARE) ×6 IMPLANT
ELECT REM PT RETURN 9FT ADLT (ELECTROSURGICAL) ×3
ELECTRODE REM PT RTRN 9FT ADLT (ELECTROSURGICAL) ×1 IMPLANT
EVACUATOR 1/8 PVC DRAIN (DRAIN) ×3 IMPLANT
GAUZE SPONGE 4X4 12PLY STRL (GAUZE/BANDAGES/DRESSINGS) ×6 IMPLANT
GAUZE XEROFORM 1X8 LF (GAUZE/BANDAGES/DRESSINGS) ×3 IMPLANT
GLOVE BIO SURGEON STRL SZ7 (GLOVE) ×3 IMPLANT
GLOVE BIO SURGEON STRL SZ7.5 (GLOVE) ×3 IMPLANT
GLOVE BIO SURGEON STRL SZ8.5 (GLOVE) ×3 IMPLANT
GLOVE BIOGEL PI IND STRL 7.0 (GLOVE) ×1 IMPLANT
GLOVE BIOGEL PI IND STRL 8 (GLOVE) ×1 IMPLANT
GLOVE BIOGEL PI IND STRL 9 (GLOVE) ×1 IMPLANT
GLOVE BIOGEL PI INDICATOR 7.0 (GLOVE) ×2
GLOVE BIOGEL PI INDICATOR 8 (GLOVE) ×2
GLOVE BIOGEL PI INDICATOR 9 (GLOVE) ×2
GOWN STRL REUS W/ TWL LRG LVL3 (GOWN DISPOSABLE) ×2 IMPLANT
GOWN STRL REUS W/ TWL XL LVL3 (GOWN DISPOSABLE) ×2 IMPLANT
GOWN STRL REUS W/TWL LRG LVL3 (GOWN DISPOSABLE) ×4
GOWN STRL REUS W/TWL XL LVL3 (GOWN DISPOSABLE) ×6
HANDPIECE INTERPULSE COAX TIP (DISPOSABLE) ×2
HOOD PEEL AWAY FACE SHEILD DIS (HOOD) ×6 IMPLANT
KIT BASIN OR (CUSTOM PROCEDURE TRAY) ×3 IMPLANT
KIT ROOM TURNOVER OR (KITS) ×3 IMPLANT
MANIFOLD NEPTUNE II (INSTRUMENTS) ×3 IMPLANT
NDL SAFETY ECLIPSE 18X1.5 (NEEDLE) IMPLANT
NEEDLE 22X1 1/2 (OR ONLY) (NEEDLE) ×3 IMPLANT
NEEDLE HYPO 18GX1.5 SHARP (NEEDLE)
NEEDLE SPNL 18GX3.5 QUINCKE PK (NEEDLE) IMPLANT
NS IRRIG 1000ML POUR BTL (IV SOLUTION) ×3 IMPLANT
PACK TOTAL JOINT (CUSTOM PROCEDURE TRAY) ×3 IMPLANT
PAD ARMBOARD 7.5X6 YLW CONV (MISCELLANEOUS) ×6 IMPLANT
PADDING CAST ABS 6INX4YD NS (CAST SUPPLIES) ×2
PADDING CAST ABS COTTON 6X4 NS (CAST SUPPLIES) ×1 IMPLANT
PADDING CAST COTTON 6X4 STRL (CAST SUPPLIES) ×3 IMPLANT
SET HNDPC FAN SPRY TIP SCT (DISPOSABLE) ×1 IMPLANT
SPONGE GAUZE 4X4 12PLY STER LF (GAUZE/BANDAGES/DRESSINGS) ×3 IMPLANT
STAPLER VISISTAT 35W (STAPLE) ×3 IMPLANT
SUCTION FRAZIER TIP 10 FR DISP (SUCTIONS) ×3 IMPLANT
SUT VIC AB 0 CTX 36 (SUTURE) ×3
SUT VIC AB 0 CTX36XBRD ANTBCTR (SUTURE) ×1 IMPLANT
SUT VIC AB 1 CTX 36 (SUTURE) ×3
SUT VIC AB 1 CTX36XBRD ANBCTR (SUTURE) ×1 IMPLANT
SUT VIC AB 2-0 CT1 27 (SUTURE) ×3
SUT VIC AB 2-0 CT1 TAPERPNT 27 (SUTURE) ×1 IMPLANT
SYR 30ML LL (SYRINGE) ×3 IMPLANT
SYR 50ML LL SCALE MARK (SYRINGE) ×3 IMPLANT
TOWEL OR 17X24 6PK STRL BLUE (TOWEL DISPOSABLE) ×3 IMPLANT
TOWEL OR 17X26 10 PK STRL BLUE (TOWEL DISPOSABLE) ×3 IMPLANT
WATER STERILE IRR 1000ML POUR (IV SOLUTION) ×6 IMPLANT

## 2014-01-19 NOTE — Transfer of Care (Signed)
Immediate Anesthesia Transfer of Care Note  Patient: Rickey Rice  Procedure(s) Performed: Procedure(s): LEFT TOTAL KNEE ARTHROPLASTY (Left)  Patient Location: PACU  Anesthesia Type:General and GA combined with regional for post-op pain  Level of Consciousness: awake, alert  and patient cooperative  Airway & Oxygen Therapy: Patient Spontanous Breathing and Patient connected to nasal cannula oxygen  Post-op Assessment: Report given to PACU RN, Post -op Vital signs reviewed and stable and Patient moving all extremities  Post vital signs: Reviewed and stable  Complications: No apparent anesthesia complications

## 2014-01-19 NOTE — Anesthesia Postprocedure Evaluation (Signed)
  Anesthesia Post-op Note  Patient: Rickey Rice  Procedure(s) Performed: Procedure(s): LEFT TOTAL KNEE ARTHROPLASTY (Left)  Patient Location: PACU  Anesthesia Type:GA combined with regional for post-op pain  Level of Consciousness: awake, alert , oriented and patient cooperative  Airway and Oxygen Therapy: Patient Spontanous Breathing and Patient connected to nasal cannula oxygen  Post-op Pain: mild  Post-op Assessment: Post-op Vital signs reviewed, Patient's Cardiovascular Status Stable, Respiratory Function Stable, Patent Airway, No signs of Nausea or vomiting and Pain level controlled  Post-op Vital Signs: Reviewed and stable  Last Vitals:  Filed Vitals:   01/19/14 1100  BP: 107/48  Pulse: 66  Temp: 36.5 C  Resp: 12    Complications: No apparent anesthesia complications

## 2014-01-19 NOTE — Progress Notes (Signed)
Verified with Cindee Salt PA with Dr. Mayer Camel about the CPM being applied tomorrow, and he states he wants it applied to patient later on this afternoon, not in the recovery room and not tomorrow. Still 0-60

## 2014-01-19 NOTE — Interval H&P Note (Signed)
History and Physical Interval Note:  01/19/2014 6:58 AM  Rickey Rice  has presented today for surgery, with the diagnosis of OSTEOARTHRITIS LEFT KNEE  The various methods of treatment have been discussed with the patient and family. After consideration of risks, benefits and other options for treatment, the patient has consented to  Procedure(s): LEFT TOTAL KNEE ARTHROPLASTY (Left) as a surgical intervention .  The patient's history has been reviewed, patient examined, no change in status, stable for surgery.  I have reviewed the patient's chart and labs.  Questions were answered to the patient's satisfaction.     Kerin Salen

## 2014-01-19 NOTE — Op Note (Signed)
PATIENT ID:      Rickey Rice  MRN:     [id number] DOB/AGE:    1940/06/13 / 73 y.o.       OPERATIVE REPORT    DATE OF PROCEDURE:  01/19/2014       PREOPERATIVE DIAGNOSIS:   OSTEOARTHRITIS LEFT KNEE      Estimated body mass index is 44.70 kg/(m^2) as calculated from the following:   Height as of 01/09/14: 5' 11.5" (1.816 m).   Weight as of 09/07/13: 147.419 kg (325 lb).                                                        POSTOPERATIVE DIAGNOSIS:   OSTEOARTHRITIS LEFT KNEE                                                                      PROCEDURE:  Procedure(s): LEFT TOTAL KNEE ARTHROPLASTY Using Depuy Sigma RP implants #5L Femur, #5Tibia, 22mm Sigma RP bearing, 41 Patella     SURGEON: Rickey Rice    ASSISTANT:   Rickey Rice   (Present and scrubbed throughout the case, critical for assistance with exposure, retraction, instrumentation, and closure.)         ANESTHESIA: GET, ACB, Exparel  DRAINS: 2 medium hemovac in knee   TOURNIQUET TIME: 38UEK   COMPLICATIONS:  None     SPECIMENS: None   INDICATIONS FOR PROCEDURE: The patient has  OSTEOARTHRITIS LEFT KNEE, varus deformities, XR shows bone on bone arthritis. Patient has failed all conservative measures including anti-inflammatory medicines, narcotics, attempts at  exercise and weight loss, cortisone injections and viscosupplementation.  Risks and benefits of surgery have been discussed, questions answered.   DESCRIPTION OF PROCEDURE: The patient identified by armband, received  IV antibiotics, in the holding area at [facility]. Patient taken to the operating room, appropriate anesthetic  monitors were attached, and general endotracheal anesthesia induced with  the patient in supine position, Foley catheter was inserted. Tourniquet  applied high to the operative thigh. Lateral post and foot positioner  applied to the table, the lower extremity was then prepped and draped  in usual sterile fashion from  the ankle to the tourniquet. Time-out procedure was performed. The limb was wrapped with an Esmarch bandage and the tourniquet inflated to 350 mmHg. We began the operation by making the anterior midline incision starting at handbreadth above the patella going over the patella 1 cm medial to and  4 cm distal to the tibial tubercle. Small bleeders in the skin and the  subcutaneous tissue identified and cauterized. Transverse retinaculum was incised and reflected medially and a medial parapatellar arthrotomy was accomplished. the patella was everted and theprepatellar fat pad resected. The superficial medial collateral  ligament was then elevated from anterior to posterior along the proximal  flare of the tibia and anterior half of the menisci resected. The knee was hyperflexed exposing bone on bone arthritis. Peripheral and notch osteophytes as well as the cruciate ligaments were then resected. We continued to  work our way around posteriorly along  the proximal tibia, and externally  rotated the tibia subluxing it out from underneath the femur. A McHale  retractor was placed through the notch and a lateral Hohmann retractor  placed, and we then drilled through the proximal tibia in line with the  axis of the tibia followed by an intramedullary guide rod and 2-degree  posterior slope cutting guide. The tibial cutting guide was pinned into place  allowing resection of 6 mm of bone medially and about 13 mm of bone  laterally because of her varus deformity. Satisfied with the tibial resection, we then  entered the distal femur 2 mm anterior to the PCL origin with the  intramedullary guide rod and applied the distal femoral cutting guide  set at 104mm, with 5 degrees of valgus. This was pinned along the  epicondylar axis. At this point, the distal femoral cut was accomplished without difficulty. We then sized for a #5L femoral component and pinned the guide in 3 degrees of external rotation.The chamfer  cutting guide was pinned into place. The anterior, posterior, and chamfer cuts were accomplished without difficulty followed by  the box cutting guide and the box cut. We also removed posterior osteophytes from the posterior femoral condyles. At this  time, the knee was brought into full extension. We checked our  extension and flexion gaps and found them symmetric at 60mm.  The patella thickness measured at 25 mm. We set the cutting guide at 15 and removed the posterior 10 mm  of the patella, sized for a 41 button and drilled the lollipop. The knee  was then once again hyperflexed exposing the proximal tibia. We sized for a #5 tibial base plate, applied the smokestack and the conical reamer followed by the the Delta fin keel punch. We then hammered into place the Sigma RP trial femoral component, inserted a 10-mm trial bearing, trial patellar button, and took the knee through range of motion from 0-130 degrees. No thumb pressure was required for patellar  tracking. At this point, all trial components were removed, a double batch of DePuy HV cement with 1500 mg of Zinacef was mixed and applied to all bony metallic mating surfaces except for the posterior condyles of the femur itself. In order, we  hammered into place the tibial tray and removed excess cement, the femoral component and removed excess cement, a 10-mm Sigma RP bearing  was inserted, and the knee brought to full extension with compression.  The patellar button was clamped into place, and excess cement  removed. While the cement cured the wound was irrigated out with normal saline solution pulse lavage, and medium Hemovac drains were placed from an anterolateral  approach. Ligament stability and patellar tracking were checked and found to be excellent. The parapatellar arthrotomy was closed with  running #1 Vicryl suture. The subcutaneous tissue with 0 and 2-0 undyed  Vicryl suture, and the skin with skin staples. A dressing of Xeroform,   4 x 4, dressing sponges, Webril, and Ace wrap applied. The patient  awakened, extubated, and taken to recovery room without difficulty.   [name] 01/19/2014, 9:01 AM

## 2014-01-19 NOTE — Anesthesia Preprocedure Evaluation (Addendum)
Anesthesia Evaluation  Patient identified by MRN, date of birth, ID band Patient awake    Reviewed: Allergy & Precautions, H&P , NPO status , Patient's Chart, lab work & pertinent test results  History of Anesthesia Complications Negative for: history of anesthetic complications  Airway Mallampati: II TM Distance: >3 FB Neck ROM: Full    Dental  (+) Chipped, Dental Advisory Given   Pulmonary shortness of breath, COPD COPD inhaler, former smoker,  breath sounds clear to auscultation        Cardiovascular hypertension, Pt. on medications - anginaRhythm:Regular Rate:Normal  04/2012 lexiscan - low risk study, evidence of inferior & apical scar but no ischemia, EF 49%   Neuro/Psych negative neurological ROS     GI/Hepatic negative GI ROS, Neg liver ROS,   Endo/Other  diabetes, Oral Hypoglycemic AgentsHypothyroidism Morbid obesity  Renal/GU negative Renal ROS     Musculoskeletal  (+) Arthritis -, Osteoarthritis,    Abdominal (+) + obese,   Peds  Hematology   Anesthesia Other Findings   Reproductive/Obstetrics                          Anesthesia Physical Anesthesia Plan  ASA: III  Anesthesia Plan: General   Post-op Pain Management:    Induction: Intravenous  Airway Management Planned: LMA  Additional Equipment:   Intra-op Plan:   Post-operative Plan:   Informed Consent: I have reviewed the patients History and Physical, chart, labs and discussed the procedure including the risks, benefits and alternatives for the proposed anesthesia with the patient or authorized representative who has indicated his/her understanding and acceptance.   Dental advisory given  Plan Discussed with: CRNA and Surgeon  Anesthesia Plan Comments: (Plan routine monitors, GA- LMA OK, femoral nerve block for post op analgesia)        Anesthesia Quick Evaluation

## 2014-01-19 NOTE — Progress Notes (Signed)
Utilization review completed.  

## 2014-01-19 NOTE — Progress Notes (Signed)
Orthopedic Tech Progress Note Patient Details:  Rickey Rice Jul 17, 1940 [id number]  CPM Left Knee CPM Left Knee: On Left Knee Flexion (Degrees): 60 Left Knee Extension (Degrees): 0 Additional Comments: applied ohf to bed   Braulio Bosch 01/19/2014, 7:01 PM

## 2014-01-19 NOTE — Plan of Care (Signed)
Problem: Consults Goal: Diagnosis- Total Joint Replacement Primary Total Knee Left     

## 2014-01-19 NOTE — Anesthesia Procedure Notes (Signed)
Anesthesia Regional Block:  Femoral nerve block  Pre-Anesthetic Checklist: ,, timeout performed, Correct Patient, Correct Site, Correct Laterality, Correct Procedure, Correct Position, site marked, Risks and benefits discussed,  Surgical consent,  Pre-op evaluation,  At surgeon's request and post-op pain management  Laterality: Left and Lower  Prep: chloraprep       Needles:  Injection technique: Single-shot  Needle Type: Echogenic Stimulator Needle     Needle Length: 9cm 9 cm Needle Gauge: 22 and 22 G    Additional Needles:  Procedures: nerve stimulator Femoral nerve block  Nerve Stimulator or Paresthesia:  Response: patella twitch, 0.45 mA, 0.1 ms,   Additional Responses:   Narrative:  Start time: 01/19/2014 7:05 AM End time: 01/19/2014 7:11 AM Injection made incrementally with aspirations every 5 mL.  Performed by: Personally  Anesthesiologist: Jenita Seashore, MD  Additional Notes: Pt identified in Holding room.  Monitors applied. Working IV access confirmed. Sterile prep L groin.  #22ga PNS to patella twitch at 0.60mA threshold.  30cc 0.5% Bupivacaine with 1:200k epi injected incrementally after negative test dose.  Patient asymptomatic, VSS, no heme aspirated, tolerated well.  Jenita Seashore, MD

## 2014-01-19 NOTE — Evaluation (Signed)
Physical Therapy Evaluation Patient Details Name: Rickey Rice MRN: [id number] DOB: 06-05-1940 Today's Date: 01/19/2014   History of Present Illness  Patient is a 73 yo male admitted 01/19/14 now s/p Lt TKA.  PMH:  Rt TKA, obesity, DM, HTN, LBBB, dyslipidemia.  Clinical Impression  Patient presents with problems listed below.  Will benefit from acute PT to maximize independence prior to discharge to next level of care.  Recommend ST-SNF at discharge for continued therapy prior to returning home with wife.    Follow Up Recommendations SNF;Supervision/Assistance - 24 hour    Equipment Recommendations  None recommended by PT    Recommendations for Other Services       Precautions / Restrictions Precautions Precautions: Knee;Fall Precaution Booklet Issued: Yes (comment) Precaution Comments: Reviewed precautions with patient. Restrictions Weight Bearing Restrictions: Yes LLE Weight Bearing: Weight bearing as tolerated      Mobility  Bed Mobility Overal bed mobility: Needs Assistance Bed Mobility: Supine to Sit;Sit to Supine     Supine to sit: Mod assist Sit to supine: Mod assist   General bed mobility comments: Verbal cues for technique.  Assist to move LLE off of bed, and to raise trunk to sitting.  Cues for proper use of UE's to push up to sitting.  Patient with good sitting balance at EOB.  Patient sat EOB x 8 minutes.  Assist to bring LLE onto bed to return to supine.  Bed in trendelenberg to scoot to [facility].  Transfers                    Ambulation/Gait                Stairs            Wheelchair Mobility    Modified Rankin (Stroke Patients Only)       Balance Overall balance assessment: Needs assistance Sitting-balance support: No upper extremity supported;Feet supported Sitting balance-Leahy Scale: Good                                       Pertinent Vitals/Pain Pain Assessment: 0-10 Pain Score: 4  Pain Location:  Lt knee Pain Descriptors / Indicators: Sore Pain Intervention(s): Monitored during session;Repositioned    Home Living Family/patient expects to be discharged to:: Skilled nursing facility ([place]) Living Arrangements: Spouse/significant other             Home Equipment: Walker - 4 wheels;Bedside commode;Shower seat      Prior Function Level of Independence: Independent with assistive device(s)               Hand Dominance   Dominant Hand: Right    Extremity/Trunk Assessment   Upper Extremity Assessment: Overall WFL for tasks assessed           Lower Extremity Assessment: LLE deficits/detail   LLE Deficits / Details: Decreased strength and ROM post-op  Cervical / Trunk Assessment: Normal  Communication   Communication: No difficulties  Cognition Arousal/Alertness: Awake/alert Behavior During Therapy: WFL for tasks assessed/performed Overall Cognitive Status: Within Functional Limits for tasks assessed                      General Comments      Exercises Total Joint Exercises Ankle Circles/Pumps: AROM;Both;10 reps;Supine      Assessment/Plan    PT Assessment Patient needs continued PT services  PT  Diagnosis Difficulty walking;Acute pain   PT Problem List Decreased strength;Decreased range of motion;Decreased activity tolerance;Decreased balance;Decreased mobility;Decreased knowledge of use of DME;Decreased knowledge of precautions;Obesity;Pain  PT Treatment Interventions DME instruction;Gait training;Functional mobility training;Therapeutic activities;Therapeutic exercise;Patient/family education   PT Goals (Current goals can be found in the Care Plan section) Acute Rehab PT Goals Patient Stated Goal: To get stronger PT Goal Formulation: With patient Time For Goal Achievement: 01/26/14 Potential to Achieve Goals: Good    Frequency 7X/week   Barriers to discharge        Co-evaluation               End of Session    Activity Tolerance: Patient limited by fatigue Patient left: in bed;with call bell/phone within reach;with family/visitor present Nurse Communication: Mobility status         Time: 4388-8757 PT Time Calculation (min): 27 min   Charges:   PT Evaluation $Initial PT Evaluation Tier I: 1 Procedure PT Treatments $Therapeutic Activity: 23-37 mins   PT G Codes:          Despina Pole 01/19/2014, 4:45 PM  Carita Pian. Sanjuana Kava, [place] Pager [phone]

## 2014-01-20 ENCOUNTER — Inpatient Hospital Stay (HOSPITAL_COMMUNITY): Payer: Medicare Other

## 2014-01-20 ENCOUNTER — Encounter (HOSPITAL_COMMUNITY): Payer: Self-pay | Admitting: General Practice

## 2014-01-20 LAB — CBC
HCT: 30.5 % — ABNORMAL LOW (ref 39.0–52.0)
Hemoglobin: 10.1 g/dL — ABNORMAL LOW (ref 13.0–17.0)
MCH: 31.8 pg (ref 26.0–34.0)
MCHC: 33.1 g/dL (ref 30.0–36.0)
MCV: 95.9 fL (ref 78.0–100.0)
Platelets: 149 10*3/uL — ABNORMAL LOW (ref 150–400)
RBC: 3.18 MIL/uL — AB (ref 4.22–5.81)
RDW: 13.3 % (ref 11.5–15.5)
WBC: 5.7 10*3/uL (ref 4.0–10.5)

## 2014-01-20 LAB — URINALYSIS, ROUTINE W REFLEX MICROSCOPIC
Bilirubin Urine: NEGATIVE
Glucose, UA: NEGATIVE mg/dL
HGB URINE DIPSTICK: NEGATIVE
Ketones, ur: NEGATIVE mg/dL
Leukocytes, UA: NEGATIVE
Nitrite: NEGATIVE
PROTEIN: NEGATIVE mg/dL
Specific Gravity, Urine: 1.021 (ref 1.005–1.030)
UROBILINOGEN UA: 1 mg/dL (ref 0.0–1.0)
pH: 5 (ref 5.0–8.0)

## 2014-01-20 LAB — GLUCOSE, CAPILLARY
Glucose-Capillary: 130 mg/dL — ABNORMAL HIGH (ref 70–99)
Glucose-Capillary: 143 mg/dL — ABNORMAL HIGH (ref 70–99)
Glucose-Capillary: 177 mg/dL — ABNORMAL HIGH (ref 70–99)

## 2014-01-20 NOTE — Clinical Social Work Placement (Addendum)
Clinical Social Work Department CLINICAL SOCIAL WORK PLACEMENT NOTE 01/20/2014  Patient:  BORDEN, THUNE  Account Number:  [id number] Admit date:  01/19/2014  Clinical Social Worker:  Wylene Men  Date/time:  01/20/2014 10:56 AM  Clinical Social Work is seeking post-discharge placement for this patient at the following level of care:   SKILLED NURSING   (*CSW will update this form in Epic as items are completed)   01/20/2014  Patient/family provided with [facility] Department of Clinical Social Work's list of facilities offering this level of care within the geographic area requested by the patient (or if unable, by the patient's family).  01/20/2014  Patient/family informed of their freedom to choose among providers that offer the needed level of care, that participate in Medicare, Medicaid or managed care program needed by the patient, have an available bed and are willing to accept the patient.  01/20/2014  Patient/family informed of [facility]' ownership interest in [facility], as well as of the fact that they are under no obligation to receive care at this facility.  PASARR submitted to EDS on 01/20/2014 PASARR number received on 01/20/2014  FL2 transmitted to all facilities in geographic area requested by pt/family on  01/20/2014 FL2 transmitted to all facilities within larger geographic area on 01/20/2014  Patient informed that his/her managed care company has contracts with or will negotiate with  certain facilities, including the following:     Patient/family informed of bed offers received:  01/20/2014 Patient chooses bed at [facility] Physician recommends and patient chooses bed at    Patient to be transferred to [facility] on   Patient to be transferred to facility by [facility] Patient and family notified of transfer on 01/21/2014 Name of family member notified:  Patient is alert and oriented and made aware of dc plans. Pt is agreeable.  Pt  signed admission paperwork with Hoyle Sauer at [facility] at bedside this morning.    The following physician request were entered in Epic:   Additional Comments:  Nonnie Done, [place] [phone]  Psychiatric & Orthopedics (5N 1-16) Clinical Social Worker

## 2014-01-20 NOTE — Progress Notes (Signed)
Physical Therapy Treatment Patient Details Name: Rickey Rice MRN: [id number] DOB: Apr 29, 1940 Today's Date: 01/20/2014    History of Present Illness Patient is a 73 yo male admitted 01/19/14 now s/p Lt TKA.  PMH:  Rt TKA, obesity, DM, HTN, LBBB, dyslipidemia.    PT Comments    *Pt progressing slowly with mobility. He walked 5' today with RW, +2 assist for sit to stand. Pain/fatigue limit activity tolerance. **  Follow Up Recommendations  SNF;Supervision/Assistance - 24 hour     Equipment Recommendations  None recommended by PT    Recommendations for Other Services       Precautions / Restrictions Precautions Precautions: Knee;Fall Precaution Comments: Reviewed precautions with patient. Restrictions Weight Bearing Restrictions: Yes LLE Weight Bearing: Weight bearing as tolerated    Mobility  Bed Mobility Overal bed mobility: Needs Assistance Bed Mobility: Supine to Sit     Supine to sit: Mod assist;HOB elevated     General bed mobility comments: up in chair  Transfers Overall transfer level: Needs assistance Equipment used: Rolling walker (2 wheeled) Transfers: Sit to/from Stand Sit to Stand: +2 physical assistance;Max assist Stand pivot transfers: Max assist;+2 physical assistance;From elevated surface       General transfer comment: +2 to power up, heavy use of BUEs on armrests; increased time  Ambulation/Gait Ambulation/Gait assistance: +2 physical assistance;+2 safety/equipment;Min assist Ambulation Distance (Feet): 5 Feet Assistive device: Rolling walker (2 wheeled) Gait Pattern/deviations: Step-to pattern;Decreased weight shift to left;Antalgic   Gait velocity interpretation: Below normal speed for age/gender General Gait Details: pt had diffiuculty advancing RLE, cues for sequencing and to unweight LLE using BUEs on RW, pain/fatigue also limiting factors   Stairs            Wheelchair Mobility    Modified Rankin (Stroke Patients  Only)       Balance Overall balance assessment: Needs assistance Sitting-balance support: No upper extremity supported;Feet supported Sitting balance-Leahy Scale: Good     Standing balance support: Bilateral upper extremity supported;During functional activity Standing balance-Leahy Scale: Poor Standing balance comment: relies on rw for balance                    Cognition Arousal/Alertness: Awake/alert Behavior During Therapy: WFL for tasks assessed/performed Overall Cognitive Status: Within Functional Limits for tasks assessed                      Exercises Total Joint Exercises Ankle Circles/Pumps: AROM;Both;15 reps;Supine Quad Sets: AROM;Both;10 reps;Supine Short Arc Quad: AAROM;Left;10 reps;Supine Heel Slides: AAROM;Left;10 reps;Supine Goniometric ROM: knee flexion AAROM 35* in supine    General Comments General comments (skin integrity, edema, etc.): no c/o dizziness in sitting or standing      Pertinent Vitals/Pain Pain Assessment: 0-10 Pain Score: 8  Pain Location: L knee Pain Descriptors / Indicators: Sore Pain Intervention(s): Limited activity within patient's tolerance;Monitored during session;Patient requesting pain meds-RN notified;Ice applied    Home Living Family/patient expects to be discharged to:: Other (Comment) ([facility]) Living Arrangements: Spouse/significant other           Home Equipment: Walker - 4 wheels;Bedside commode;Shower seat      Prior Function Level of Independence: Independent with assistive device(s)          PT Goals (current goals can now be found in the care plan section) Acute Rehab PT Goals Patient Stated Goal: to work out at the gym PT Goal Formulation: With patient/family Time For Goal Achievement: 01/26/14 Potential  to Achieve Goals: Good Progress towards PT goals: Progressing toward goals    Frequency  7X/week    PT Plan Current plan remains appropriate    Co-evaluation              End of Session   Activity Tolerance: Patient limited by fatigue;Patient limited by pain Patient left: with call bell/phone within reach;with family/visitor present;in chair     Time: 0762-2633 PT Time Calculation (min): 41 min  Charges:  $Gait Training: 8-22 mins $Therapeutic Exercise: 8-22 mins $Therapeutic Activity: 8-22 mins                    G Codes:      Philomena Doheny 01/20/2014, 10:57 AM [phone]

## 2014-01-20 NOTE — Care Management Note (Signed)
CARE MANAGEMENT NOTE 01/20/2014  Patient:  Rickey Rice, Rickey Rice   Account Number:  [id number]  Date Initiated:  01/20/2014  Documentation initiated by:  [name]  Subjective/Objective Assessment:   73 yr old male admitted with osteoarthritis of left knee. Patient underwent left total knee arthroplasty.     Action/Plan:   Patient will need shortterm rehab at SNF, Patient plans to go to [facility]. Social Worker is aware.  Patient was preoperatively setup with [facility].   Anticipated DC Date:  01/21/2014   Anticipated DC Plan:  SKILLED NURSING FACILITY  In-house referral  Clinical Social Worker      DC Planning Services  CM consult      [facility] Choice  NA   Choice offered to / List presented to:  NA   DME arranged  NA      DME agency  [employer]     [place] arranged  NA      [facility].   Status of service:   Medicare Important Message given?  NA - LOS <3 / Initial given by admissions (If response is "NO", the following Medicare IM given date fields will be blank) Date Medicare IM given:   Medicare IM given by:   Date Additional Medicare IM given:   Additional Medicare IM given by:    Discharge Disposition:  [place]  Per UR Regulation:  Reviewed for med. necessity/level of care/duration of stay  If discussed at [place] of Stay Meetings, dates discussed:

## 2014-01-20 NOTE — Evaluation (Signed)
Occupational Therapy Evaluation Patient Details Name: Rickey Rice MRN: [id number] DOB: 1940-09-27 Today's Date: 01/20/2014    History of Present Illness Patient is a 73 yo male admitted 01/19/14 now s/p Lt TKA.  PMH:  Rt TKA, obesity, DM, HTN, LBBB, dyslipidemia.   Clinical Impression   Pt admitted with the above diagnoses and presents with below problem list. Pt will benefit from continued acute OT to address the below listed deficits and maximize independence with basic ADLs prior to d/c to next venue. PTA pt was independent with ADLs. PT currently needing +2 max physical assist for LB ADLs and stand pivot transfers. ADL education provided to pt and spouse. All further OT needs to be addressed in next venue of care.      Follow Up Recommendations  SNF    Equipment Recommendations  Other (comment) (defer to next venue)    Recommendations for Other Services       Precautions / Restrictions Precautions Precautions: Knee;Fall Precaution Comments: Reviewed precautions with patient. Restrictions Weight Bearing Restrictions: Yes LLE Weight Bearing: Weight bearing as tolerated      Mobility Bed Mobility Overal bed mobility: Needs Assistance Bed Mobility: Supine to Sit     Supine to sit: Mod assist;HOB elevated     General bed mobility comments: HOB elevated. physical assist to advance LLE and to power up trunk.   Transfers Overall transfer level: Needs assistance Equipment used: Rolling walker (2 wheeled) (bariatric) Transfers: Sit to/from [employer] Transfers Sit to Stand: +2 physical assistance;Max assist;From elevated surface Stand pivot transfers: Max assist;+2 physical assistance;From elevated surface       General transfer comment: +2 to power up    Balance Overall balance assessment: Needs assistance Sitting-balance support: No upper extremity supported;Feet supported Sitting balance-Leahy Scale: Good     Standing balance support: Bilateral  upper extremity supported;During functional activity Standing balance-Leahy Scale: Poor Standing balance comment: relies on rw for balance                            ADL Overall ADL's : Needs assistance/impaired Eating/Feeding: Set up;Sitting   Grooming: Set up;Sitting   Upper Body Bathing: Sitting;Set up   Lower Body Bathing: +2 for physical assistance;Maximal assistance;Sit to/from stand   Upper Body Dressing : Supervision/safety;Sitting   Lower Body Dressing: Maximal assistance;+2 for physical assistance;Sit to/from stand   Toilet Transfer: Maximal assistance;+2 for physical assistance;BSC;RW;Requires wide/bariatric;Stand-pivot   Toileting- Clothing Manipulation and Hygiene: Maximal assistance;+2 for physical assistance;Sit to/from stand   Tub/ Shower Transfer: Maximal assistance;+2 for physical assistance;Stand-pivot;3 in 1;Rolling walker     General ADL Comments: Pt needs +2 max A for OOB transfers. Reviewed ADL education with pt and spouse.     Vision                     Perception     Praxis      Pertinent Vitals/Pain Pain Assessment: 0-10 Pain Score: 5  Pain Location: Lt knee Pain Descriptors / Indicators: Aching Pain Intervention(s): Monitored during session;Limited activity within patient's tolerance;Repositioned;Utilized relaxation techniques     Hand Dominance Right   Extremity/Trunk Assessment Upper Extremity Assessment Upper Extremity Assessment: Overall WFL for tasks assessed   Lower Extremity Assessment Lower Extremity Assessment: Defer to PT evaluation       Communication Communication Communication: No difficulties   Cognition Arousal/Alertness: Awake/alert Behavior During Therapy: WFL for tasks assessed/performed Overall Cognitive Status: Within Functional Limits for tasks  assessed                     General Comments       Exercises       Shoulder Instructions      Home Living Family/patient expects  to be discharged to:: Skilled nursing facility Living Arrangements: Spouse/significant other                           Home Equipment: Walker - 4 wheels;Bedside commode;Shower seat          Prior Functioning/Environment Level of Independence: Independent with assistive device(s)             OT Diagnosis: Acute pain;Generalized weakness   OT Problem List: Impaired balance (sitting and/or standing);Decreased knowledge of use of DME or AE;Decreased knowledge of precautions;Obesity;Pain;Decreased activity tolerance   OT Treatment/Interventions:      OT Goals(Current goals can be found in the care plan section) Acute Rehab OT Goals Patient Stated Goal: not stated  OT Frequency:     Barriers to D/C:            Co-evaluation              End of Session Equipment Utilized During Treatment: Gait belt;Rolling walker  Activity Tolerance: Patient tolerated treatment well Patient left: in chair;with call bell/phone within reach;with family/visitor present   Time: 7628-3151 OT Time Calculation (min): 30 min Charges:  OT General Charges $OT Visit: 1 Procedure OT Evaluation $Initial OT Evaluation Tier I: 1 Procedure OT Treatments $Self Care/Home Management : 23-37 mins G-Codes:    Hortencia Pilar Feb 15, 2014, 9:44 AM

## 2014-01-20 NOTE — Clinical Social Work Psychosocial (Signed)
Clinical Social Work Department BRIEF PSYCHOSOCIAL ASSESSMENT 01/20/2014  Patient:  Rickey Rice, Rickey Rice     Account Number:  [id number]     Admit date:  01/19/2014  Clinical Social Worker:  Wylene Men  Date/Time:  01/20/2014 10:35 AM  Referred by:  Physician  Date Referred:  01/20/2014 Referred for  SNF Placement  Psychosocial assessment   Other Referral:   none   Interview type:  Patient Other interview type:   none    PSYCHOSOCIAL DATA Living Status:  WIFE Admitted from facility:   Level of care:   Primary support name:  Rickey Rice Primary support relationship to patient:  SPOUSE Degree of support available:   adequate    CURRENT CONCERNS Current Concerns  Post-Acute Placement   Other Concerns:   none    SOCIAL WORK ASSESSMENT / PLAN CSW assessed pt at bedside.  Pt is alert and oriented x4 during the course of this assessment.  Pt states he has viewed/signed pre-liminary paperwork with [employer].  Pt states that he has been to [facility] in the past with the "other knee" and would like to go there again.  Pt is aware of [facility] process of bedside admission's paperwork and is agreeable for [facility] S[facility] to complete paperwork.  Pt states he will need transportation to SNF once medically dc'd. Projected dc date: 01/21/2014 (Wednesday).  Pt is aware and agreeable.  Pt is hopeful regarding his prognosis and recovery.   Assessment/plan status:  Psychosocial Support/Ongoing Assessment of Needs Other assessment/ plan:   FL2  PASARR- should be existing   Information/referral to community resources:   SNF/STR    PATIENT'S/FAMILY'S RESPONSE TO PLAN OF CARE: Pt is agreeable to SNF/STR and requests to be admitted to [facility] once medically dc'd.  CSW will f/u with [facility].       Nonnie Done, [place] [phone]  Psychiatric & Orthopedics (5N 1-16) Clinical Social Worker

## 2014-01-20 NOTE — Progress Notes (Signed)
Patient ID: Rickey Rice, male   DOB: 19-Sep-1940, 73 y.o.   MRN: [id number] PATIENT ID: Rickey Rice  MRN: [id number]  DOB/AGE:  [birth year] / 73 y.o.  1 Day Post-Op Procedure(s) (LRB): LEFT TOTAL KNEE ARTHROPLASTY (Left)    PROGRESS NOTE Subjective: Patient is alert, oriented, no Nausea, no Vomiting, yes passing gas, no Bowel Movement. Taking PO well. Denies SOB, Chest or Calf Pain. Using Incentive Spirometer, PAS in place. Ambulate weightbearing as tolerated with physical therapy, CPM 0-40 Patient reports pain as 4 on 0-10 scale  .    Objective: Vital signs in last 24 hours: Filed Vitals:   01/19/14 1700 01/19/14 2048 01/20/14 0118 01/20/14 0600  BP: 119/57 132/58 128/62 110/43  Pulse: 75 76 78 85  Temp: 97.1 F (36.2 C) 100.1 F (37.8 C) 99.9 F (37.7 C) 100 F (37.8 C)  TempSrc:      Resp:  18 18 18   SpO2: 100% 100% 100% 97%      Intake/Output from previous day: I/O last 3 completed shifts: In: 42 [P.O.:600; I.V.:2000] Out: 400 [Urine:400]   Intake/Output this shift: Total I/O In: -  Out: 1200 [Urine:550; Drains:650]   LABORATORY DATA:  Recent Labs  01/19/14 1226 01/19/14 1652 01/19/14 2126 01/20/14 0615  WBC  --   --   --  5.7  HGB  --   --   --  10.1*  HCT  --   --   --  30.5*  PLT  --   --   --  149*  GLUCAP 173* 188* 122*  --     Examination: Neurologically intact ABD soft Neurovascular intact Sensation intact distally Intact pulses distally Dorsiflexion/Plantar flexion intact Incision: dressing C/D/I No cellulitis present Compartment soft} Blood and plasma separated in drain indicating minimal recent drainage, drain pulled without difficulty.  Assessment:   1 Day Post-Op Procedure(s) (LRB): LEFT TOTAL KNEE ARTHROPLASTY (Left) ADDITIONAL DIAGNOSIS: Expected Acute Blood Loss Anemia, Diabetes, Hypertension and Cardiac Arrythmia LBB chronic  Plan: PT/OT WBAT, CPM 5/hrs day until ROM 0-90 degrees, then D/C CPM DVT Prophylaxis:   SCDx72hrs, ASA 325 mg BID x 2 weeks DISCHARGE PLAN: Skilled Nursing Facility/Rehab, [facility] reservation DISCHARGE NEEDS: HHPT, HHRN, CPM, Walker and 3-in-1 comode seat     Leyda Vanderwerf J 01/20/2014, 6:54 AM

## 2014-01-20 NOTE — Progress Notes (Signed)
Orthopedic Tech Progress Note Patient Details:  Rickey Rice 01-16-1941 [id number] On cpm at 8:00 pm Patient ID: GARRETTE CAINE, male   DOB: 06-13-1940, 73 y.o.   MRN: [id number]   Braulio Bosch 01/20/2014, 8:00 PM

## 2014-01-20 NOTE — Progress Notes (Signed)
Notified Rickey Rice for Dr Mayer Camel of Pts temp of 103 at 1545. New order to get a UA/Cx, CXR and blood cultures. I admin 650 MG of Tylenol at this time and will continue to monitor Pts status as well as his temp.

## 2014-01-21 LAB — CBC
HCT: 28.5 % — ABNORMAL LOW (ref 39.0–52.0)
HEMOGLOBIN: 9.5 g/dL — AB (ref 13.0–17.0)
MCH: 31.4 pg (ref 26.0–34.0)
MCHC: 33.3 g/dL (ref 30.0–36.0)
MCV: 94.1 fL (ref 78.0–100.0)
Platelets: 136 10*3/uL — ABNORMAL LOW (ref 150–400)
RBC: 3.03 MIL/uL — ABNORMAL LOW (ref 4.22–5.81)
RDW: 13.4 % (ref 11.5–15.5)
WBC: 7.7 10*3/uL (ref 4.0–10.5)

## 2014-01-21 LAB — GLUCOSE, CAPILLARY
GLUCOSE-CAPILLARY: 142 mg/dL — AB (ref 70–99)
GLUCOSE-CAPILLARY: 146 mg/dL — AB (ref 70–99)
Glucose-Capillary: 122 mg/dL — ABNORMAL HIGH (ref 70–99)

## 2014-01-21 NOTE — Progress Notes (Signed)
Called report to Albin, nurse at [facility]. Discharge instructions reviewed with patient. Patient ready for discharge. He will be transported by EMS.

## 2014-01-21 NOTE — Progress Notes (Signed)
Physical Therapy Treatment Patient Details Name: Rickey Rice MRN: [id number] DOB: 18-Nov-1940 Today's Date: 01/21/2014    History of Present Illness Patient is a 73 yo male admitted 01/19/14 now s/p Lt TKA.  PMH:  Rt TKA, obesity, DM, HTN, LBBB, dyslipidemia.    PT Comments    Making quite good progress with functional mobility and amb distance today; Need continued work on knee extension control; should progress well at SNF  Follow Up Recommendations  SNF;Supervision/Assistance - 24 hour     Equipment Recommendations  None recommended by PT    Recommendations for Other Services       Precautions / Restrictions Precautions Precautions: Knee;Fall Precaution Comments: Educated pt in the importance of having nothing placed under knee (pillow, bolster, etc.) for optimal healing. Restrictions Weight Bearing Restrictions: Yes LLE Weight Bearing: Weight bearing as tolerated    Mobility  Bed Mobility Overal bed mobility: Needs Assistance Bed Mobility: Supine to Sit     Supine to sit: Mod assist;HOB elevated     General bed mobility comments: Cues for prepoitioning and technique; Required handheld assist and assist to move LLE an dhip to scoot to and sit up at EOB  Transfers Overall transfer level: Needs assistance Equipment used: Rolling walker (2 wheeled) Transfers: Sit to/from Stand Sit to Stand: +2 physical assistance;Mod assist         General transfer comment: +2 to power up, heavy use of BUEs on armrests; increased time  Ambulation/Gait Ambulation/Gait assistance: +2 safety/equipment;Min assist Ambulation Distance (Feet): 40 Feet Assistive device: Rolling walker (2 wheeled) Gait Pattern/deviations: Step-to pattern;Trunk flexed     General Gait Details: Cues for gait sequence, posture, and to self-monitor for activity tolerance; Noted some mild L knee buckle in stance; cues to activate quad for stance stability   Stairs            Wheelchair  Mobility    Modified Rankin (Stroke Patients Only)       Balance                                    Cognition Arousal/Alertness: Awake/alert Behavior During Therapy: WFL for tasks assessed/performed Overall Cognitive Status: Within Functional Limits for tasks assessed                      Exercises Total Joint Exercises Ankle Circles/Pumps: AROM;Both;15 reps;Supine Quad Sets: AROM;Both;10 reps;Supine Short Arc Quad: AAROM;Left;10 reps;Supine Heel Slides: AAROM;Left;10 reps;Supine Straight Leg Raises: AAROM;Left;10 reps Goniometric ROM: knee flexion to approx 60 deg; muxcel guarding noted    General Comments        Pertinent Vitals/Pain Pain Assessment: 0-10 Pain Score: 4  Pain Location: L knee Pain Descriptors / Indicators: Aching Pain Intervention(s): Monitored during session    Home Living                      Prior Function            PT Goals (current goals can now be found in the care plan section) Acute Rehab PT Goals Patient Stated Goal: to work out at the gym PT Goal Formulation: With patient Time For Goal Achievement: 01/26/14 Potential to Achieve Goals: Good Progress towards PT goals: Progressing toward goals    Frequency  7X/week    PT Plan Current plan remains appropriate    Co-evaluation  End of Session Equipment Utilized During Treatment: Gait belt Activity Tolerance: Patient tolerated treatment well Patient left: with call bell/phone within reach;with family/visitor present;in chair     Time: 0832-0900 PT Time Calculation (min): 28 min  Charges:  $Gait Training: 8-22 mins $Therapeutic Exercise: 8-22 mins                    G Codes:      Roney Marion Hamff 01/21/2014, 10:04 AM  Roney Marion, PT  Acute Rehabilitation Services Pager [phone] Office [phone]

## 2014-01-21 NOTE — Clinical Social Work Note (Signed)
Patient to discharge to [facility] SNF today RN to call report to: [phone] Transportation: Joycelyn Man at [facility] to complete admission's paperwork at bedside. DC summary has been sent to SNF for review.  Nonnie Done, [place] [phone]  Psychiatric & Orthopedics (5N 1-16) Clinical Social Worker

## 2014-01-21 NOTE — Discharge Summary (Signed)
Patient ID: Rickey Rice MRN: [id number] DOB/AGE: 73-May-1942 73 y.o.  Admit date: 01/19/2014 Discharge date: 01/21/2014  Admission Diagnoses:  Principal Problem:   Degenerative arthritis of left knee Active Problems:   Arthritis of left knee   Discharge Diagnoses:  Same  Past Medical History  Diagnosis Date  . Diabetes mellitus without complication     type 2  . Hypertension   . Hypothyroidism   . Morbid obesity   . Dyslipidemia   . LBBB (left bundle branch block)   . History of nuclear stress test 04/2012    lexiscan - 2 day protocol; low risk study, evidence of inferrior & apical scar but no ischemia   . Arthritis   . Shortness of breath     with exertion   . Prostate enlargement     Surgeries: Procedure(s): LEFT TOTAL KNEE ARTHROPLASTY on 01/19/2014   Consultants:    Discharged Condition: Improved  Hospital Course: Rickey Rice is an 73 y.o. male who was admitted 01/19/2014 for operative treatment ofDegenerative arthritis of left knee. Patient has severe unremitting pain that affects sleep, daily activities, and work/hobbies. After pre-op clearance the patient was taken to the operating room on 01/19/2014 and underwent  Procedure(s): LEFT TOTAL KNEE ARTHROPLASTY.    Patient was given perioperative antibiotics: Anti-infectives   Start     Dose/Rate Route Frequency Ordered Stop   01/19/14 0600  ceFAZolin (ANCEF) 3 g in dextrose 5 % 50 mL IVPB     3 g 160 mL/hr over 30 Minutes Intravenous On call to O.R. 01/18/14 1246 01/19/14 0755       Patient was given sequential compression devices, early ambulation, and chemoprophylaxis to prevent DVT.  Patient benefited maximally from hospital stay and there were no complications.    Recent vital signs: Patient Vitals for the past 24 hrs:  BP Temp Temp src Pulse Resp SpO2  01/21/14 0522 103/56 mmHg 98.3 F (36.8 C) Oral 89 18 92 %  01/21/14 0400 - - - - 18 90 %  01/21/14 0140 98/49 mmHg - - - - -  01/21/14  0000 - - - - 18 90 %  01/20/14 2200 95/51 mmHg - - - - -  01/20/14 2020 99/58 mmHg - - - - -  01/20/14 2000 92/45 mmHg 97.6 F (36.4 C) Axillary 96 18 90 %  01/20/14 1803 - 99.8 F (37.7 C) Oral - - -  01/20/14 1535 105/57 mmHg 103 F (39.4 C) Oral 99 18 100 %  01/20/14 1200 - - - - 18 -     Recent laboratory studies:  Recent Labs  01/20/14 0615 01/21/14 0600  WBC 5.7 7.7  HGB 10.1* 9.5*  HCT 30.5* 28.5*  PLT 149* 136*     Discharge Medications:     Medication List    STOP taking these medications       aspirin 81 MG tablet  Replaced by:  aspirin EC 325 MG tablet     celecoxib 200 MG capsule  Commonly known as:  CELEBREX     HYDROcodone-acetaminophen 5-325 MG per tablet  Commonly known as:  NORCO/VICODIN      TAKE these medications       acetaminophen 500 MG tablet  Commonly known as:  TYLENOL  Take 1,000 mg by mouth every 6 (six) hours as needed (pain).     amLODipine 5 MG tablet  Commonly known as:  NORVASC  Take 5 mg by mouth daily.     amoxicillin 500  MG capsule  Commonly known as:  AMOXIL  Take 500 mg by mouth as directed. Take 2 capsules 1 hour prior to dental procedures and 2 capsules 1 hour after     aspirin EC 325 MG tablet  Take 1 tablet (325 mg total) by mouth 2 (two) times daily.     glimepiride 4 MG tablet  Commonly known as:  AMARYL  Take 8 mg by mouth daily before breakfast.     levothyroxine 75 MCG tablet  Commonly known as:  SYNTHROID, LEVOTHROID  Take 75 mcg by mouth daily before breakfast.     LUTEIN ESTERS PO  Take 1 capsule by mouth every other day.     metFORMIN 1000 MG tablet  Commonly known as:  GLUCOPHAGE  Take 1,000 mg by mouth daily with breakfast.     ONGLYZA 5 MG Tabs tablet  Generic drug:  saxagliptin HCl  Take 5 mg by mouth daily.     oxyCODONE-acetaminophen 5-325 MG per tablet  Commonly known as:  ROXICET  Take 1 tablet by mouth every 4 (four) hours as needed.     rosuvastatin 10 MG tablet  Commonly known  as:  CRESTOR  Take 10 mg by mouth daily.     tamsulosin 0.4 MG Caps capsule  Commonly known as:  FLOMAX  Take 1 capsule by mouth daily.     TEKTURNA HCT 300-25 MG Tabs  Generic drug:  Aliskiren-Hydrochlorothiazide  Take 1 tablet by mouth daily.     tizanidine 2 MG capsule  Commonly known as:  ZANAFLEX  Take 1 capsule (2 mg total) by mouth 3 (three) times daily.        Diagnostic Studies: Dg Chest Port 1 View  01/20/2014   CLINICAL DATA:  Fever.  EXAM: PORTABLE CHEST - 1 VIEW  COMPARISON:  02/07/2013.  FINDINGS: Heart size is enlarged. Calcified atherosclerotic disease involves the thoracic aorta. There is no pleural effusion or edema noted. There is asymmetric elevation of the right hemidiaphragm. No airspace consolidation noted. The pulmonary arteries are prominent suggestive of PA hypertension.  IMPRESSION: 1. No acute cardiopulmonary abnormalities. 2. Cardiac enlargement 3. Asymmetric elevation of right hemidiaphragm.   Electronically Signed   By: Kerby Moors M.D.   On: 01/20/2014 16:39    Disposition: 01-Home or Self Care      Discharge Instructions   CPM    Complete by:  As directed   Continuous passive motion machine (CPM):      Use the CPM from 0 to 60  for 5 hours per day.      You may increase by 10 degrees per day.  You may break it up into 2 or 3 sessions per day.      Use CPM for 2 weeks or until you are told to stop.     Call MD / Call 911    Complete by:  As directed   If you experience chest pain or shortness of breath, CALL 911 and be transported to the hospital emergency room.  If you develope a fever above 101 F, pus (white drainage) or increased drainage or redness at the wound, or calf pain, call your surgeon's office.     Change dressing    Complete by:  As directed   Change dressing on 5, then change the dressing daily with sterile 4 x 4 inch gauze dressing and apply TED hose.  You may clean the incision with alcohol prior to redressing.      Constipation  Prevention    Complete by:  As directed   Drink plenty of fluids.  Prune juice may be helpful.  You may use a stool softener, such as Colace (over the counter) 100 mg twice a day.  Use MiraLax (over the counter) for constipation as needed.     Diet - low sodium heart healthy    Complete by:  As directed      Driving restrictions    Complete by:  As directed   No driving for 2 weeks     Increase activity slowly as tolerated    Complete by:  As directed      Patient may shower    Complete by:  As directed   You may shower without a dressing once there is no drainage.  Do not wash over the wound.  If drainage remains, cover wound with plastic wrap and then shower.           Follow-up Information   Follow up with Kerin Salen, MD In 2 weeks.   Specialty:  Orthopedic Surgery   Contact information:   [place] [address] [phone]        Signed: Hardin Negus Helio Lack R 01/21/2014, 8:05 AM

## 2014-01-21 NOTE — Progress Notes (Signed)
Physical Therapy Treatment Patient Details Name: Rickey Rice MRN: [id number] DOB: 1940/10/13 Today's Date: 01/21/2014    History of Present Illness Patient is a 73 yo male admitted 01/19/14 now s/p Lt TKA.  PMH:  Rt TKA, obesity, DM, HTN, LBBB, dyslipidemia.    PT Comments    Very willing to participate with gait training; More pain this afternoon limiting distance; Should do well at SNF, pt stated during session, "you have to move to improve"  Follow Up Recommendations  SNF;Supervision/Assistance - 24 hour     Equipment Recommendations  None recommended by PT    Recommendations for Other Services       Precautions / Restrictions Precautions Precautions: Knee;Fall Precaution Comments: Educated pt in the importance of having nothing placed under knee (pillow, bolster, etc.) for optimal healing. Restrictions LLE Weight Bearing: Weight bearing as tolerated    Mobility  Bed Mobility Overal bed mobility: Needs Assistance Bed Mobility: Supine to Sit     Supine to sit: Mod assist;HOB elevated     General bed mobility comments: Cues for prepoitioning and technique; Required handheld assist and assist to move LLE an dhip to scoot to and sit up at EOB  Transfers Overall transfer level: Needs assistance Equipment used: Rolling walker (2 wheeled) Transfers: Sit to/from Stand Sit to Stand: +2 physical assistance;Mod assist         General transfer comment: +2 to power up, heavy use of BUEs on armrests; increased time  Ambulation/Gait Ambulation/Gait assistance: +2 safety/equipment;Mod assist Ambulation Distance (Feet): 15 Feet Assistive device: Rolling walker (2 wheeled) Gait Pattern/deviations: Step-to pattern;Trunk flexed     General Gait Details: Cues for gait sequence, posture, and to self-monitor for activity tolerance; Noted some mild L knee buckle in stance; cues to activate quad for stance stability; Mod assist for advancing RW   Stairs             Wheelchair Mobility    Modified Rankin (Stroke Patients Only)       Balance             Standing balance-Leahy Scale: Poor                      Cognition Arousal/Alertness: Awake/alert Behavior During Therapy: WFL for tasks assessed/performed Overall Cognitive Status: Within Functional Limits for tasks assessed                      Exercises      General Comments        Pertinent Vitals/Pain Pain Assessment: 0-10 Pain Score: 6  Pain Location: L knee Pain Descriptors / Indicators: Aching Pain Intervention(s): Limited activity within patient's tolerance;Monitored during session;Repositioned    Home Living                      Prior Function            PT Goals (current goals can now be found in the care plan section) Acute Rehab PT Goals Patient Stated Goal: to work out at the gym PT Goal Formulation: With patient Time For Goal Achievement: 01/26/14 Potential to Achieve Goals: Good Progress towards PT goals: Progressing toward goals    Frequency  7X/week    PT Plan Current plan remains appropriate    Co-evaluation             End of Session Equipment Utilized During Treatment: Gait belt Activity Tolerance: Patient tolerated treatment well Patient left: with  call bell/phone within reach;with family/visitor present;in chair     Time: 1610-9604 PT Time Calculation (min): 13 min  Charges:  $Gait Training: 8-22 mins                    G Codes:      Roney Marion Hamff 01/21/2014, 4:09 PM  Roney Marion, [place] Pager [phone] Office [phone]

## 2014-01-21 NOTE — Progress Notes (Signed)
PATIENT ID: Rickey Rice  MRN: [id number]  DOB/AGE:  02-Feb-1941 / 73 y.o.  2 Days Post-Op Procedure(s) (LRB): LEFT TOTAL KNEE ARTHROPLASTY (Left)    PROGRESS NOTE Subjective: Patient is alert, oriented, no Nausea, no Vomiting, yes passing gas, yes Bowel Movement. Taking PO well. Denies SOB, Chest or Calf Pain. Using Incentive Spirometer, PAS in place. Ambulate WBAT, CPM 0-60 Patient reports pain as 3 on 0-10 scale  .    Objective: Vital signs in last 24 hours: Filed Vitals:   01/21/14 0000 01/21/14 0140 01/21/14 0400 01/21/14 0522  BP:  98/49  103/56  Pulse:    89  Temp:    98.3 F (36.8 C)  TempSrc:    Oral  Resp: 18  18 18   SpO2: 90%  90% 92%      Intake/Output from previous day: I/O last 3 completed shifts: In: 2440 [P.O.:1440; I.V.:1000] Out: 1325 [Urine:675; Drains:650]   Intake/Output this shift:     LABORATORY DATA:  Recent Labs  01/20/14 0615  01/20/14 1647 01/21/14 0055 01/21/14 0600 01/21/14 0626  WBC 5.7  --   --   --  7.7  --   HGB 10.1*  --   --   --  9.5*  --   HCT 30.5*  --   --   --  28.5*  --   PLT 149*  --   --   --  136*  --   GLUCAP  --   < > 177* 146*  --  142*  < > = values in this interval not displayed.  Examination: Neurologically intact Neurovascular intact Sensation intact distally Intact pulses distally Dorsiflexion/Plantar flexion intact Incision: dressing C/D/I No cellulitis present Compartment soft}  Assessment:   2 Days Post-Op Procedure(s) (LRB): LEFT TOTAL KNEE ARTHROPLASTY (Left) ADDITIONAL DIAGNOSIS: Expected Acute Blood Loss Anemia, Diabetes, Hypertension and Cardiac Arrythmia LBB chronic Fever - Has resolved and all studies have came back negative including U/A and chest x-ray.  Plan: PT/OT WBAT, CPM 5/hrs day until ROM 0-90 degrees, then D/C CPM DVT Prophylaxis:  SCDx72hrs, ASA 325 mg BID x 2 weeks DISCHARGE PLAN: Skilled Nursing Facility/Rehab, [facility] when bed available DISCHARGE NEEDS: HHPT, HHRN,  CPM, Walker and 3-in-1 comode seat     PHILLIPS, ERIC R 01/21/2014, 8:01 AM

## 2014-01-22 ENCOUNTER — Non-Acute Institutional Stay (SKILLED_NURSING_FACILITY): Payer: Medicare Other | Admitting: Internal Medicine

## 2014-01-22 DIAGNOSIS — M129 Arthropathy, unspecified: Secondary | ICD-10-CM

## 2014-01-22 DIAGNOSIS — M1712 Unilateral primary osteoarthritis, left knee: Secondary | ICD-10-CM

## 2014-01-22 DIAGNOSIS — E119 Type 2 diabetes mellitus without complications: Secondary | ICD-10-CM

## 2014-01-22 DIAGNOSIS — E039 Hypothyroidism, unspecified: Secondary | ICD-10-CM

## 2014-01-22 DIAGNOSIS — N400 Enlarged prostate without lower urinary tract symptoms: Secondary | ICD-10-CM

## 2014-01-22 LAB — URINE CULTURE
Colony Count: NO GROWTH
Culture: NO GROWTH
SPECIAL REQUESTS: NORMAL

## 2014-01-24 DIAGNOSIS — N400 Enlarged prostate without lower urinary tract symptoms: Secondary | ICD-10-CM | POA: Insufficient documentation

## 2014-01-24 NOTE — Progress Notes (Signed)
HISTORY & PHYSICAL  DATE: 01/22/2014   FACILITY: [facility]  LEVEL OF CARE: SNF (31)  ALLERGIES:  Allergies  Allergen Reactions  . Bee Venom Swelling    Swelling on lips and tongue  . Pioglitazone     Lips swelling  . Shrimp [Shellfish Allergy]     rash    CHIEF COMPLAINT:  Manage left knee OA, hypothyroidism & DM  HISTORY OF PRESENT ILLNESS: pt is a 73 y/o AAM:  KNEE OSTEOARTHRITIS: Patient had a history of pain and functional disability in the knee due to end-stage osteoarthritis and has failed nonsurgical conservative treatments. Patient had worsening of pain with activity and weight bearing, pain that interfered with activities of daily living & pain with passive range of motion. Therefore patient underwent total knee arthroplasty and tolerated the procedure well. Patient is admitted to this facility for sort short-term rehabilitation. Patient denies knee pain.  DM:pt's DM remains stable.  Pt denies polyuria, polydipsia, polyphagia, changes in vision or hypoglycemic episodes.  No complications noted from the medication presently being used.  Last hemoglobin A1c is: 6.8 in 11/14.  HYPOTHYROIDISM: The hypothyroidism remains stable. No complications noted from the medications presently being used.  The patient denies fatigue or constipation.  Last TSH is not available.  PAST MEDICAL HISTORY :  Past Medical History  Diagnosis Date  . Diabetes mellitus without complication     type 2  . Hypertension   . Hypothyroidism   . Morbid obesity   . Dyslipidemia   . LBBB (left bundle branch block)   . History of nuclear stress test 04/2012    lexiscan - 2 day protocol; low risk study, evidence of inferrior & apical scar but no ischemia   . Arthritis   . Shortness of breath     with exertion   . Prostate enlargement     PAST SURGICAL HISTORY: Past Surgical History  Procedure Laterality Date  . Mouth surgery  1963  . Finger surgery  1954  .  Colonoscopy w/ biopsies    . Knee arthroscopy Left   . Total knee arthroplasty Right 02/05/2013    Dr Mayer Camel  . Total knee arthroplasty Right 02/05/2013    Procedure: TOTAL KNEE ARTHROPLASTY;  Surgeon: Kerin Salen, MD;  Location: [facility];  Service: Orthopedics;  Laterality: Right;  . Total knee arthroplasty Left 01/09/2014    DR Mayer Camel  . Total knee arthroplasty Left 01/19/2014    Procedure: LEFT TOTAL KNEE ARTHROPLASTY;  Surgeon: Kerin Salen, MD;  Location: [facility];  Service: Orthopedics;  Laterality: Left;    SOCIAL HISTORY:  reports that he has never smoked. He has never used smokeless tobacco. He reports that he does not drink alcohol or use illicit drugs.  FAMILY HISTORY:  Family History  Problem Relation Age of Onset  . Cancer Mother   . Kidney disease Brother   . Hypertension Sister     CURRENT MEDICATIONS: Reviewed per MAR/see medication list  REVIEW OF SYSTEMS: Cardiac: c/o BLE swelling,  See HPI otherwise 14 point ROS is negative.  PHYSICAL EXAMINATION  VS:  See VS section  GENERAL: no acute distress, morbidly obese body habitus EYES: conjunctivae normal, sclerae normal, normal eye lids MOUTH/THROAT: lips without lesions,no lesions in the mouth,tongue is without lesions,uvula elevates in midline NECK: supple, trachea midline, no neck masses, no thyroid tenderness, no thyromegaly LYMPHATICS: no LAN in the neck, no supraclavicular LAN RESPIRATORY: breathing is even &  unlabored, BS CTAB CARDIAC: RRR, no murmur,no extra heart sounds, +3 BLE edema GI:  ABDOMEN: abdomen soft, normal BS, no masses, no tenderness  LIVER/SPLEEN: no hepatomegaly, no splenomegaly MUSCULOSKELETAL: HEAD: normal to inspection  EXTREMITIES: LEFT UPPER EXTREMITY: full range of motion, normal strength & tone RIGHT UPPER EXTREMITY:  full range of motion, normal strength & tone LEFT LOWER EXTREMITY:  range of motion not tested due to surgery, normal strength & tone RIGHT LOWER EXTREMITY: moderate  range of motion, normal strength & tone PSYCHIATRIC: the patient is alert & oriented to person, affect & behavior appropriate  LABS/RADIOLOGY:  Labs reviewed: Basic Metabolic Panel:  Recent Labs  01/29/13 0944 09/07/13 0318 01/09/14 1247  NA 143 139 143  K 4.0 3.4* 3.7  CL 105 96 103  CO2 28 30 29   GLUCOSE 121* 133* 160*  BUN 17 16 15   CREATININE 1.10 1.03 1.09  CALCIUM 9.5 10.0 9.1   CBC:  Recent Labs  01/29/13 0944  09/07/13 0318 01/09/14 1247 01/20/14 0615 01/21/14 0600  WBC 5.4  < > 6.9 4.8 5.7 7.7  NEUTROABS 2.7  --  4.8 2.7  --   --   HGB 12.8*  < > 11.7* 12.1* 10.1* 9.5*  HCT 37.8*  < > 34.3* 36.0* 30.5* 28.5*  MCV 93.6  < > 91.7 92.1 95.9 94.1  PLT 186  < > 188 190 149* 136*  < > = values in this interval not displayed.  Cardiac Enzymes:  Recent Labs  09/07/13 0318  CKTOTAL 180  TROPONINI <0.30   CBG:  Recent Labs  01/21/14 0055 01/21/14 0626 01/21/14 1223  GLUCAP 146* 142* 122*    PORTABLE CHEST - 1 VIEW   COMPARISON:  02/07/2013.   FINDINGS: Heart size is enlarged. Calcified atherosclerotic disease involves the thoracic aorta. There is no pleural effusion or edema noted. There is asymmetric elevation of the right hemidiaphragm. No airspace consolidation noted. The pulmonary arteries are prominent suggestive of PA hypertension.   IMPRESSION: 1. No acute cardiopulmonary abnormalities. 2. Cardiac enlargement 3. Asymmetric elevation of right hemidiaphragm   ASSESSMENT/PLAN:  Left knee OA-s/p total knee arthroplasty.  Cont. Rehabilitation. Hypothyroidism-cont. Levothyroxine. DM-cont. meds BPH-denies sx.  Cont. Current flomax Acute blood loss anemia-check Hb HTN-well controlled Check cbc  I have reviewed patient's medical records received at admission/from hospitalization.  CPT CODE: 08144  Ryane Canavan Y Thad[address] [place], [place] [phone]

## 2014-01-26 ENCOUNTER — Encounter: Payer: Self-pay | Admitting: Internal Medicine

## 2014-01-26 ENCOUNTER — Non-Acute Institutional Stay (SKILLED_NURSING_FACILITY): Payer: Medicare Other | Admitting: Internal Medicine

## 2014-01-26 DIAGNOSIS — R682 Dry mouth, unspecified: Secondary | ICD-10-CM

## 2014-01-26 NOTE — Progress Notes (Signed)
Patient ID: Rickey Rice, male   DOB: [birth year], 73 y.o.   MRN: [id number]    Place of Service: [facility]  Allergies  Allergen Reactions  . Bee Venom Swelling    Swelling on lips and tongue  . Codeine   . Demerol [Meperidine]   . Pioglitazone     Lips swelling  . Shrimp [Shellfish Allergy]     rash  . Sulfa Antibiotics     Code Status: Full Code  Goals of Care: Longevity  Chief Complaint  Patient presents with  . Acute Visit    dry mouth at bedtime    HPI  73 y.o. male with PMH of DM2, HTN, HLD, morbid obesity, OA s/p recent Left knee replacement among others is being seen for an acute visit at the request of nursing staff for the evaluation of dry mouth at bedtime. Patient stated that his dry mouth symptom has improved since he started drinking more water and using ACT dry mouth lozenges. No additonal complaints verbalized by patient. No additional concerns from staff.   Review of Systems Constitutional: Negative for fever, chills, and fatigue. HENT: Negative for facial swelling, ear pain, congestion, and sore throat. Positive for dry mouth Eyes: Negative for eye pain, eye discharge, and visual disturbance  Cardiovascular: Negative for chest pain, palpitations, and leg swelling Respiratory: Negative cough, shortness of breath, and wheezing.  Gastrointestinal: Negative for nausea and vomiting. Negative for abdominal pain, diarrhea and constipation.  Neurological: Negative for dizziness, headache, weakness, and tremors.  Skin: Negative for rash  Psychiatric: Negative for nervous/anxious, agitation, depression, and suicidal ideas.   Past Medical History  Diagnosis Date  . Diabetes mellitus without complication     type 2  . Hypertension   . Hypothyroidism   . Morbid obesity   . Dyslipidemia   . LBBB (left bundle branch block)   . History of nuclear stress test 04/2012    lexiscan - 2 day protocol; low risk study, evidence of inferrior & apical scar  but no ischemia   . Arthritis   . Shortness of breath     with exertion   . Prostate enlargement     Past Surgical History  Procedure Laterality Date  . Mouth surgery  1963  . Finger surgery  1954  . Colonoscopy w/ biopsies    . Knee arthroscopy Left   . Total knee arthroplasty Right 02/05/2013    Dr Mayer Camel  . Total knee arthroplasty Right 02/05/2013    Procedure: TOTAL KNEE ARTHROPLASTY;  Surgeon: Kerin Salen, MD;  Location: [facility];  Service: Orthopedics;  Laterality: Right;  . Total knee arthroplasty Left 01/09/2014    DR Mayer Camel  . Total knee arthroplasty Left 01/19/2014    Procedure: LEFT TOTAL KNEE ARTHROPLASTY;  Surgeon: Kerin Salen, MD;  Location: [facility];  Service: Orthopedics;  Laterality: Left;    History   Social History  . Marital Status: Married    Spouse Name: N/A    Number of Children: 2  . Years of Education: N/A   Occupational History  . owner of photography shop    Social History Main Topics  . Smoking status: Never Smoker   . Smokeless tobacco: Never Used  . Alcohol Use: No  . Drug Use: No  . Sexual Activity: Not on file   Other Topics Concern  . Not on file   Social History Narrative      Medication List  This list is accurate as of: 01/26/14 11:48 AM.  Always use your most recent med list.               acetaminophen 500 MG tablet  Commonly known as:  TYLENOL  Take 1,000 mg by mouth every 6 (six) hours as needed (pain).     amLODipine 5 MG tablet  Commonly known as:  NORVASC  Take 5 mg by mouth daily.     amoxicillin 500 MG capsule  Commonly known as:  AMOXIL  Take 500 mg by mouth as directed. Take 2 capsules 1 hour prior to dental procedures and 2 capsules 1 hour after     aspirin EC 325 MG tablet  Take 1 tablet (325 mg total) by mouth 2 (two) times daily.     atorvastatin 40 MG tablet  Commonly known as:  LIPITOR  Take 40 mg by mouth daily.     glimepiride 4 MG tablet  Commonly known as:  AMARYL  Take 8 mg by mouth  daily before breakfast.     levothyroxine 75 MCG tablet  Commonly known as:  SYNTHROID, LEVOTHROID  Take 75 mcg by mouth daily before breakfast.     LUTEIN ESTERS PO  Take 1 capsule by mouth every other day.     metFORMIN 1000 MG tablet  Commonly known as:  GLUCOPHAGE  Take 1,000 mg by mouth daily with breakfast.     ONGLYZA 5 MG Tabs tablet  Generic drug:  saxagliptin HCl  Take 5 mg by mouth daily.     oxyCODONE-acetaminophen 5-325 MG per tablet  Commonly known as:  ROXICET  Take 1 tablet by mouth every 4 (four) hours as needed.     tamsulosin 0.4 MG Caps capsule  Commonly known as:  FLOMAX  Take 1 capsule by mouth daily.     TEKTURNA HCT 300-25 MG Tabs  Generic drug:  Aliskiren-Hydrochlorothiazide  Take 1 tablet by mouth daily.     tizanidine 2 MG capsule  Commonly known as:  ZANAFLEX  Take 1 capsule (2 mg total) by mouth 3 (three) times daily.        Physical Exam Filed Vitals:   01/26/14 1131  BP: 92/57  Pulse: 86  Temp: 98.5 F (36.9 C)  Resp: 16   Constitutional: Obese AA elderly male in no acute distress.  HEENT: Normocephalic and atraumatic. PERRL. EOM intact. No icterus. Oral mucosa moist. Posterior pharynx clear of any exudate or lesions.  Neck: No JVD or carotid bruits. Cardiac: Normal S1, S2. RRR without appreciable murmurs, rubs, or gallops. Intact pulses intact. No dependent edema.  Lungs: No respiratory distress. Breath sounds clear bilaterally without rales, rhonchi, or wheezes. Abdomen: Audible bowel sounds in all quadrants. Soft, nontender, nondistended. No palpable mass.  Musculoskeletal: Able to move all extremities.  Skin: Warm and dry. No rash noted. Left knee surgical dressing dry and intact. Neurological: Alert and oriented to person, place, and time.  Psychiatric: Judgment and insight adequate. Appropriate mood and affect.   Labs Reviewed CBC Latest Ref Rng 01/21/2014 01/20/2014 01/09/2014  WBC 4.0 - 10.5 K/uL 7.7 5.7 4.8  Hemoglobin  13.0 - 17.0 g/dL 9.5(L) 10.1(L) 12.1(L)  Hematocrit 39.0 - 52.0 % 28.5(L) 30.5(L) 36.0(L)  Platelets 150 - 400 K/uL 136(L) 149(L) 190    CMP     Component Value Date/Time   NA 143 01/09/2014 1247   K 3.7 01/09/2014 1247   CL 103 01/09/2014 1247   CO2 29 01/09/2014 1247  GLUCOSE 160* 01/09/2014 1247   BUN 15 01/09/2014 1247   CREATININE 1.09 01/09/2014 1247   CALCIUM 9.1 01/09/2014 1247   GFRNONAA 65* 01/09/2014 1247   GFRAA 76* 01/09/2014 1247    Assessment & Plan 1. Dry mouth -Secondary to Zanaflex Improved. Encourage resident to continue increasing his fluid intake and use ACT dry mouth lozenges as needed for relief of symptoms. Continue to monitor.    Family/Staff Communication Plan of care discuss with resident and professional staff members. Resident and professional staff members verbalize understanding and agree with plan of care. No additional questions or concerns reported.    Arthur Holms, MSN, AGNP-C [facility], [place] [address] [phone] [8am-5pm] After hours: [phone]  I have personally reviewed this note and agree with the care plan  [facility], MD  [facility] [phone] (Monday-Friday 8 am - 5 pm) [phone] (afterhours)

## 2014-01-27 LAB — CULTURE, BLOOD (ROUTINE X 2)
CULTURE: NO GROWTH
Culture: NO GROWTH

## 2014-02-03 ENCOUNTER — Non-Acute Institutional Stay (SKILLED_NURSING_FACILITY): Payer: Medicare Other | Admitting: Adult Health

## 2014-02-03 ENCOUNTER — Encounter: Payer: Self-pay | Admitting: Adult Health

## 2014-02-03 DIAGNOSIS — M1712 Unilateral primary osteoarthritis, left knee: Secondary | ICD-10-CM

## 2014-02-03 DIAGNOSIS — E119 Type 2 diabetes mellitus without complications: Secondary | ICD-10-CM

## 2014-02-03 DIAGNOSIS — I10 Essential (primary) hypertension: Secondary | ICD-10-CM

## 2014-02-03 DIAGNOSIS — E039 Hypothyroidism, unspecified: Secondary | ICD-10-CM

## 2014-02-03 DIAGNOSIS — E785 Hyperlipidemia, unspecified: Secondary | ICD-10-CM

## 2014-02-03 DIAGNOSIS — M129 Arthropathy, unspecified: Secondary | ICD-10-CM

## 2014-02-03 DIAGNOSIS — N400 Enlarged prostate without lower urinary tract symptoms: Secondary | ICD-10-CM

## 2014-02-03 NOTE — Progress Notes (Addendum)
Patient ID: Rickey Rice, male   DOB: 1941/03/22, 73 y.o.   MRN: [id number]   02/03/2014  Facility:  [facility]ursing Home Location:  [place] Room Number: 508-P LEVEL OF CARE:  SNF (31)   Chief Complaint  Patient presents with  . Discharge Note    Osteoarthritis S/P Left total knee arthroplasty, Hypertension, Diabetes mellitus, Hypothyroidism, Hyperlipidemia and BPH    HISTORY OF PRESENT ILLNESS:  This is a 73 year old male who is for discharge home with Home health PT, OT and Nursing. He has been admitted to [facility] on 01/21/14 from [facility] with Arthritis of left knee S/P Left total knee arthroplasty. Patient was admitted to this facility for short-term rehabilitation after the patient's recent hospitalization.  Patient has completed SNF rehabilitation and therapy has cleared the patient for discharge.  REASSESSMENT OF ONGOING PROBLEMS:  HTN: Pt 's HTN remains stable.  Denies CP, sob, DOE, headaches, dizziness or visual disturbances.  No complications from the medications currently being used.  Last BP : 128/80  BPH: The patient's BPH remains stable. Patient denies urinary hesitancy or dribbling. No complications reported from the current medications being used.  DM:pt's DM remains stable.  Pt denies polyuria, polydipsia, polyphagia, changes in vision or hypoglycemic episodes.  No complications noted from the medication presently being used.   10/15 hemoglobin A1c is: 6.5  PAST MEDICAL HISTORY:  Past Medical History  Diagnosis Date  . Diabetes mellitus without complication     type 2  . Hypertension   . Hypothyroidism   . Morbid obesity   . Dyslipidemia   . LBBB (left bundle branch block)   . History of nuclear stress test 04/2012    lexiscan - 2 day protocol; low risk study, evidence of inferrior & apical scar but no ischemia   . Arthritis   . Shortness of breath     with exertion   . Prostate enlargement     CURRENT  MEDICATIONS: Reviewed per MAR/see medication list  Allergies  Allergen Reactions  . Bee Venom Swelling    Swelling on lips and tongue  . Codeine   . Demerol [Meperidine]   . Pioglitazone     Lips swelling  . Shrimp [Shellfish Allergy]     rash  . Sulfa Antibiotics      REVIEW OF SYSTEMS:  GENERAL: no change in appetite, no fatigue, no weight changes, no fever, chills or weakness RESPIRATORY: no cough, SOB, DOE, wheezing, hemoptysis CARDIAC: no chest pain, or palpitations, +edema GI: no abdominal pain, diarrhea, constipation, heart burn, nausea or vomiting  PHYSICAL EXAMINATION  GENERAL: no acute distress, morbidly obese EYES: conjunctivae normal, sclerae normal, normal eye lids NECK: supple, trachea midline, no neck masses, no thyroid tenderness, no thyromegaly LYMPHATICS: no LAN in the neck, no supraclavicular LAN RESPIRATORY: breathing is even & unlabored, BS CTAB CARDIAC: RRR, no murmur,no extra heart sounds, LLE edema 2+ and RLE edema 1+ GI: abdomen soft, normal BS, no masses, no tenderness, no hepatomegaly, no splenomegaly EXTREMITIES: able to move all 4 extremities; ambulates with rolling walker PSYCHIATRIC: the patient is alert & oriented to person, affect & behavior appropriate  LABS/RADIOLOGY: Labs reviewed: Basic Metabolic Panel:  Recent Labs  09/07/13 0318 01/09/14 1247  NA 139 143  K 3.4* 3.7  CL 96 103  CO2 30 29  GLUCOSE 133* 160*  BUN 16 15  CREATININE 1.03 1.09  CALCIUM 10.0 9.1   CBC:  Recent Labs  09/07/13 0318  01/09/14 1247 01/20/14 0615 01/21/14 0600  WBC 6.9 4.8 5.7 7.7  NEUTROABS 4.8 2.7  --   --   HGB 11.7* 12.1* 10.1* 9.5*  HCT 34.3* 36.0* 30.5* 28.5*  MCV 91.7 92.1 95.9 94.1  PLT 188 190 149* 136*   Cardiac Enzymes:  Recent Labs  09/07/13 0318  CKTOTAL 180  TROPONINI <0.30   CBG:  Recent Labs  01/21/14 0055 01/21/14 0626 01/21/14 1223  GLUCAP 146* 142* 122*    Dg Chest Port 1 View  01/20/2014   CLINICAL  DATA:  Fever.  EXAM: PORTABLE CHEST - 1 VIEW  COMPARISON:  02/07/2013.  FINDINGS: Heart size is enlarged. Calcified atherosclerotic disease involves the thoracic aorta. There is no pleural effusion or edema noted. There is asymmetric elevation of the right hemidiaphragm. No airspace consolidation noted. The pulmonary arteries are prominent suggestive of PA hypertension.  IMPRESSION: 1. No acute cardiopulmonary abnormalities. 2. Cardiac enlargement 3. Asymmetric elevation of right hemidiaphragm.   Electronically Signed   By: Kerby Moors M.D.   On: 01/20/2014 16:39    ASSESSMENT/PLAN:  Arthritis of left knee status post left total knee arthroplasty - for home health PT, OT and nursing; continue Zanaflex 2 mg by mouth 3 times a day and Percocet 5/325 mg 1 by mouth every 4 hours when necessary for pain Hypertension - well controlled; continue amlodipine 5 mg by mouth daily Diabetes mellitus, type II - well controlled; hemoglobin A1c 6.5; continue the Lipitor 8 mg by mouth daily, metformin 1000 mg by mouth daily and Onglyza 5 mg by mouth daily Hypothyroidism - well controlled; continue Synthroid 75 mcg 1 tab by mouth daily Dyslipidemia - continue Lipitor 40 mg 1 tab by mouth daily BPH - stable; continue Flomax 0.4 mg by mouth daily   I have filled out patient's discharge paperwork and written prescriptions.  Patient will receive home health PT, OT and Nursing.   Total discharge time: Greater than 30 minutes  Discharge time involved coordination of the discharge process with social worker, nursing staff and therapy department. Medical justification for home health services verified.   CPT CODE: 62836   MEDINA-VARGAS,Tehila Sokolow, [facility] [phone]

## 2014-02-10 ENCOUNTER — Ambulatory Visit: Payer: Medicare Other | Admitting: Urology

## 2014-03-17 ENCOUNTER — Other Ambulatory Visit: Payer: Self-pay | Admitting: Adult Health

## 2014-03-23 ENCOUNTER — Other Ambulatory Visit: Payer: Self-pay | Admitting: Adult Health

## 2014-03-31 ENCOUNTER — Encounter: Payer: Self-pay | Admitting: Cardiology

## 2014-05-25 ENCOUNTER — Other Ambulatory Visit: Payer: Self-pay | Admitting: Adult Health

## 2014-05-26 DIAGNOSIS — I469 Cardiac arrest, cause unspecified: Secondary | ICD-10-CM

## 2014-05-26 HISTORY — DX: Cardiac arrest, cause unspecified: I46.9

## 2014-06-07 ENCOUNTER — Inpatient Hospital Stay (HOSPITAL_COMMUNITY): Payer: Medicare Other

## 2014-06-07 ENCOUNTER — Inpatient Hospital Stay (HOSPITAL_COMMUNITY)
Admission: EM | Admit: 2014-06-07 | Discharge: 2014-06-19 | DRG: 224 | Disposition: A | Discharge status: Home / Self Care | Payer: Medicare Other | Attending: Internal Medicine | Admitting: Internal Medicine

## 2014-06-07 ENCOUNTER — Emergency Department (HOSPITAL_COMMUNITY): Payer: Medicare Other

## 2014-06-07 ENCOUNTER — Other Ambulatory Visit (HOSPITAL_COMMUNITY): Payer: Self-pay

## 2014-06-07 ENCOUNTER — Encounter (HOSPITAL_COMMUNITY): Payer: Self-pay | Admitting: Neurology

## 2014-06-07 DIAGNOSIS — E870 Hyperosmolality and hypernatremia: Secondary | ICD-10-CM | POA: Diagnosis present

## 2014-06-07 DIAGNOSIS — G931 Anoxic brain damage, not elsewhere classified: Secondary | ICD-10-CM | POA: Diagnosis present

## 2014-06-07 DIAGNOSIS — Z452 Encounter for adjustment and management of vascular access device: Secondary | ICD-10-CM

## 2014-06-07 DIAGNOSIS — I429 Cardiomyopathy, unspecified: Secondary | ICD-10-CM | POA: Diagnosis not present

## 2014-06-07 DIAGNOSIS — I469 Cardiac arrest, cause unspecified: Secondary | ICD-10-CM | POA: Diagnosis present

## 2014-06-07 DIAGNOSIS — Z79899 Other long term (current) drug therapy: Secondary | ICD-10-CM

## 2014-06-07 DIAGNOSIS — E876 Hypokalemia: Secondary | ICD-10-CM | POA: Diagnosis not present

## 2014-06-07 DIAGNOSIS — Z6841 Body Mass Index (BMI) 40.0 and over, adult: Secondary | ICD-10-CM

## 2014-06-07 DIAGNOSIS — Z95810 Presence of automatic (implantable) cardiac defibrillator: Secondary | ICD-10-CM

## 2014-06-07 DIAGNOSIS — I447 Left bundle-branch block, unspecified: Secondary | ICD-10-CM | POA: Diagnosis present

## 2014-06-07 DIAGNOSIS — I428 Other cardiomyopathies: Secondary | ICD-10-CM | POA: Diagnosis present

## 2014-06-07 DIAGNOSIS — I10 Essential (primary) hypertension: Secondary | ICD-10-CM | POA: Diagnosis present

## 2014-06-07 DIAGNOSIS — I420 Dilated cardiomyopathy: Secondary | ICD-10-CM | POA: Diagnosis present

## 2014-06-07 DIAGNOSIS — I2699 Other pulmonary embolism without acute cor pulmonale: Secondary | ICD-10-CM

## 2014-06-07 DIAGNOSIS — E785 Hyperlipidemia, unspecified: Secondary | ICD-10-CM | POA: Diagnosis present

## 2014-06-07 DIAGNOSIS — D649 Anemia, unspecified: Secondary | ICD-10-CM | POA: Diagnosis present

## 2014-06-07 DIAGNOSIS — I129 Hypertensive chronic kidney disease with stage 1 through stage 4 chronic kidney disease, or unspecified chronic kidney disease: Secondary | ICD-10-CM | POA: Diagnosis present

## 2014-06-07 DIAGNOSIS — N182 Chronic kidney disease, stage 2 (mild): Secondary | ICD-10-CM | POA: Diagnosis present

## 2014-06-07 DIAGNOSIS — Z789 Other specified health status: Secondary | ICD-10-CM

## 2014-06-07 DIAGNOSIS — Z96653 Presence of artificial knee joint, bilateral: Secondary | ICD-10-CM | POA: Diagnosis present

## 2014-06-07 DIAGNOSIS — E1129 Type 2 diabetes mellitus with other diabetic kidney complication: Secondary | ICD-10-CM | POA: Diagnosis present

## 2014-06-07 DIAGNOSIS — I5021 Acute systolic (congestive) heart failure: Secondary | ICD-10-CM | POA: Diagnosis present

## 2014-06-07 DIAGNOSIS — E039 Hypothyroidism, unspecified: Secondary | ICD-10-CM | POA: Diagnosis present

## 2014-06-07 DIAGNOSIS — I959 Hypotension, unspecified: Secondary | ICD-10-CM | POA: Diagnosis not present

## 2014-06-07 DIAGNOSIS — Z7982 Long term (current) use of aspirin: Secondary | ICD-10-CM

## 2014-06-07 DIAGNOSIS — J9600 Acute respiratory failure, unspecified whether with hypoxia or hypercapnia: Secondary | ICD-10-CM

## 2014-06-07 DIAGNOSIS — I509 Heart failure, unspecified: Secondary | ICD-10-CM | POA: Diagnosis not present

## 2014-06-07 DIAGNOSIS — J9601 Acute respiratory failure with hypoxia: Secondary | ICD-10-CM | POA: Diagnosis present

## 2014-06-07 DIAGNOSIS — E119 Type 2 diabetes mellitus without complications: Secondary | ICD-10-CM | POA: Diagnosis present

## 2014-06-07 DIAGNOSIS — Z95828 Presence of other vascular implants and grafts: Secondary | ICD-10-CM | POA: Insufficient documentation

## 2014-06-07 DIAGNOSIS — I4901 Ventricular fibrillation: Principal | ICD-10-CM | POA: Diagnosis present

## 2014-06-07 DIAGNOSIS — N400 Enlarged prostate without lower urinary tract symptoms: Secondary | ICD-10-CM | POA: Diagnosis present

## 2014-06-07 DIAGNOSIS — Z01818 Encounter for other preprocedural examination: Secondary | ICD-10-CM

## 2014-06-07 DIAGNOSIS — Z9289 Personal history of other medical treatment: Secondary | ICD-10-CM

## 2014-06-07 DIAGNOSIS — Z8674 Personal history of sudden cardiac arrest: Secondary | ICD-10-CM | POA: Diagnosis present

## 2014-06-07 LAB — APTT
aPTT: 31 seconds (ref 24–37)
aPTT: 64 seconds — ABNORMAL HIGH (ref 24–37)

## 2014-06-07 LAB — I-STAT ARTERIAL BLOOD GAS, ED
ACID-BASE DEFICIT: 1 mmol/L (ref 0.0–2.0)
Bicarbonate: 26.7 mEq/L — ABNORMAL HIGH (ref 20.0–24.0)
O2 Saturation: 100 %
PH ART: 7.284 — AB (ref 7.350–7.450)
TCO2: 28 mmol/L (ref 0–100)
pCO2 arterial: 55.4 mmHg — ABNORMAL HIGH (ref 35.0–45.0)
pO2, Arterial: 263 mmHg — ABNORMAL HIGH (ref 80.0–100.0)

## 2014-06-07 LAB — I-STAT CG4 LACTIC ACID, ED: Lactic Acid, Venous: 3.24 mmol/L (ref 0.5–2.0)

## 2014-06-07 LAB — PROTIME-INR
INR: 1.17 (ref 0.00–1.49)
INR: 1.23 (ref 0.00–1.49)
PROTHROMBIN TIME: 15 s (ref 11.6–15.2)
PROTHROMBIN TIME: 15.6 s — AB (ref 11.6–15.2)

## 2014-06-07 LAB — I-STAT CHEM 8, ED
BUN: 25 mg/dL — ABNORMAL HIGH (ref 6–23)
CREATININE: 1.1 mg/dL (ref 0.50–1.35)
Calcium, Ion: 1.16 mmol/L (ref 1.13–1.30)
Chloride: 103 mmol/L (ref 96–112)
Glucose, Bld: 227 mg/dL — ABNORMAL HIGH (ref 70–99)
HCT: 34 % — ABNORMAL LOW (ref 39.0–52.0)
HEMOGLOBIN: 11.6 g/dL — AB (ref 13.0–17.0)
Potassium: 3.4 mmol/L — ABNORMAL LOW (ref 3.5–5.1)
Sodium: 146 mmol/L — ABNORMAL HIGH (ref 135–145)
TCO2: 21 mmol/L (ref 0–100)

## 2014-06-07 LAB — COMPREHENSIVE METABOLIC PANEL
ALT: 637 U/L — ABNORMAL HIGH (ref 0–53)
ANION GAP: 10 (ref 5–15)
AST: 450 U/L — AB (ref 0–37)
Albumin: 2.9 g/dL — ABNORMAL LOW (ref 3.5–5.2)
Alkaline Phosphatase: 98 U/L (ref 39–117)
BILIRUBIN TOTAL: 0.4 mg/dL (ref 0.3–1.2)
BUN: 22 mg/dL (ref 6–23)
CHLORIDE: 109 mmol/L (ref 96–112)
CO2: 23 mmol/L (ref 19–32)
Calcium: 7.8 mg/dL — ABNORMAL LOW (ref 8.4–10.5)
Creatinine, Ser: 1.14 mg/dL (ref 0.50–1.35)
GFR calc Af Amer: 72 mL/min — ABNORMAL LOW (ref >90)
GFR, EST NON AFRICAN AMERICAN: 62 mL/min — AB (ref >90)
Glucose, Bld: 224 mg/dL — ABNORMAL HIGH (ref 70–99)
Potassium: 3.5 mmol/L (ref 3.5–5.1)
SODIUM: 142 mmol/L (ref 135–145)
TOTAL PROTEIN: 5.7 g/dL — AB (ref 6.0–8.3)

## 2014-06-07 LAB — I-STAT TROPONIN, ED: Troponin i, poc: 0.01 ng/mL (ref 0.00–0.08)

## 2014-06-07 LAB — TROPONIN I

## 2014-06-07 LAB — URINE MICROSCOPIC-ADD ON

## 2014-06-07 LAB — GLUCOSE, CAPILLARY
GLUCOSE-CAPILLARY: 126 mg/dL — AB (ref 70–99)
Glucose-Capillary: 104 mg/dL — ABNORMAL HIGH (ref 70–99)

## 2014-06-07 LAB — CBC WITH DIFFERENTIAL/PLATELET
Basophils Absolute: 0 10*3/uL (ref 0.0–0.1)
Basophils Relative: 0 % (ref 0–1)
Eosinophils Absolute: 0.1 10*3/uL (ref 0.0–0.7)
Eosinophils Relative: 2 % (ref 0–5)
HEMATOCRIT: 32.9 % — AB (ref 39.0–52.0)
Hemoglobin: 10.5 g/dL — ABNORMAL LOW (ref 13.0–17.0)
Lymphocytes Relative: 30 % (ref 12–46)
Lymphs Abs: 2.7 10*3/uL (ref 0.7–4.0)
MCH: 30 pg (ref 26.0–34.0)
MCHC: 31.9 g/dL (ref 30.0–36.0)
MCV: 94 fL (ref 78.0–100.0)
Monocytes Absolute: 0.7 10*3/uL (ref 0.1–1.0)
Monocytes Relative: 8 % (ref 3–12)
NEUTROS PCT: 60 % (ref 43–77)
Neutro Abs: 5.6 10*3/uL (ref 1.7–7.7)
PLATELETS: 164 10*3/uL (ref 150–400)
RBC: 3.5 MIL/uL — ABNORMAL LOW (ref 4.22–5.81)
RDW: 13.9 % (ref 11.5–15.5)
WBC: 9.2 10*3/uL (ref 4.0–10.5)

## 2014-06-07 LAB — URINALYSIS, ROUTINE W REFLEX MICROSCOPIC
Bilirubin Urine: NEGATIVE
GLUCOSE, UA: 250 mg/dL — AB
KETONES UR: NEGATIVE mg/dL
Leukocytes, UA: NEGATIVE
Nitrite: NEGATIVE
Protein, ur: >300 mg/dL — AB
Specific Gravity, Urine: 1.017 (ref 1.005–1.030)
UROBILINOGEN UA: 1 mg/dL (ref 0.0–1.0)
pH: 6.5 (ref 5.0–8.0)

## 2014-06-07 LAB — MRSA PCR SCREENING: MRSA BY PCR: NEGATIVE

## 2014-06-07 LAB — CBG MONITORING, ED: GLUCOSE-CAPILLARY: 228 mg/dL — AB (ref 70–99)

## 2014-06-07 LAB — MAGNESIUM: MAGNESIUM: 1.7 mg/dL (ref 1.5–2.5)

## 2014-06-07 LAB — PHOSPHORUS: Phosphorus: 4.8 mg/dL — ABNORMAL HIGH (ref 2.3–4.6)

## 2014-06-07 LAB — HEPARIN LEVEL (UNFRACTIONATED): Heparin Unfractionated: 0.49 IU/mL (ref 0.30–0.70)

## 2014-06-07 MED ORDER — SODIUM CHLORIDE 0.9 % IV BOLUS (SEPSIS)
1000.0000 mL | Freq: Once | INTRAVENOUS | Status: AC
  Administered 2014-06-07: 1000 mL via INTRAVENOUS

## 2014-06-07 MED ORDER — PROPOFOL 10 MG/ML IV EMUL
5.0000 ug/kg/min | INTRAVENOUS | Status: DC
  Administered 2014-06-07: 30 ug/kg/min via INTRAVENOUS
  Administered 2014-06-07: 40 ug/kg/min via INTRAVENOUS
  Administered 2014-06-07: 30 ug/kg/min via INTRAVENOUS
  Administered 2014-06-08: 40 ug/kg/min via INTRAVENOUS
  Administered 2014-06-08: 45 ug/kg/min via INTRAVENOUS
  Administered 2014-06-08: 30 ug/kg/min via INTRAVENOUS
  Administered 2014-06-08: 50 ug/kg/min via INTRAVENOUS
  Administered 2014-06-08: 35 ug/kg/min via INTRAVENOUS
  Administered 2014-06-08: 40 ug/kg/min via INTRAVENOUS
  Administered 2014-06-08: 50 ug/kg/min via INTRAVENOUS
  Administered 2014-06-09: 45 ug/kg/min via INTRAVENOUS
  Administered 2014-06-09: 25 ug/kg/min via INTRAVENOUS
  Administered 2014-06-09: 45 ug/kg/min via INTRAVENOUS
  Administered 2014-06-09: 35 ug/kg/min via INTRAVENOUS
  Filled 2014-06-07 (×16): qty 100

## 2014-06-07 MED ORDER — ASPIRIN 300 MG RE SUPP
300.0000 mg | Freq: Once | RECTAL | Status: AC
  Administered 2014-06-07: 300 mg via RECTAL
  Filled 2014-06-07: qty 1

## 2014-06-07 MED ORDER — SODIUM CHLORIDE 0.9 % IV SOLN: 2000.0000 mL | Freq: Once | INTRAVENOUS | Status: AC

## 2014-06-07 MED ORDER — CHLORHEXIDINE GLUCONATE 0.12 % MT SOLN
15.0000 mL | Freq: Two times a day (BID) | OROMUCOSAL | Status: DC
  Administered 2014-06-07 – 2014-06-09 (×4): 15 mL via OROMUCOSAL
  Filled 2014-06-07 (×4): qty 15

## 2014-06-07 MED ORDER — SODIUM CHLORIDE 0.45 % IV SOLN
INTRAVENOUS | Status: DC
  Administered 2014-06-07 – 2014-06-08 (×2): via INTRAVENOUS
  Filled 2014-06-07 (×6): qty 1000

## 2014-06-07 MED ORDER — NOREPINEPHRINE BITARTRATE 1 MG/ML IV SOLN
0.0000 ug/min | INTRAVENOUS | Status: DC
  Administered 2014-06-07: 2 ug/min via INTRAVENOUS
  Filled 2014-06-07: qty 4

## 2014-06-07 MED ORDER — ETOMIDATE 2 MG/ML IV SOLN
INTRAVENOUS | Status: AC | PRN
  Administered 2014-06-07: 20 mg via INTRAVENOUS

## 2014-06-07 MED ORDER — LEVOTHYROXINE SODIUM 75 MCG PO TABS
75.0000 ug | ORAL_TABLET | Freq: Every day | ORAL | Status: DC
  Administered 2014-06-08 – 2014-06-19 (×11): 75 ug
  Filled 2014-06-07 (×12): qty 1

## 2014-06-07 MED ORDER — PROPOFOL 10 MG/ML IV EMUL
INTRAVENOUS | Status: AC
  Administered 2014-06-07: 20 ug/kg/min via INTRAVENOUS
  Filled 2014-06-07: qty 100

## 2014-06-07 MED ORDER — ROSUVASTATIN CALCIUM 10 MG PO TABS
10.0000 mg | ORAL_TABLET | Freq: Every day | ORAL | Status: DC
  Administered 2014-06-07 – 2014-06-18 (×11): 10 mg
  Filled 2014-06-07 (×13): qty 1

## 2014-06-07 MED ORDER — ASPIRIN 325 MG PO TABS
325.0000 mg | ORAL_TABLET | Freq: Every day | ORAL | Status: DC
  Administered 2014-06-07 – 2014-06-15 (×9): 325 mg
  Filled 2014-06-07 (×9): qty 1

## 2014-06-07 MED ORDER — FAMOTIDINE IN NACL 20-0.9 MG/50ML-% IV SOLN
20.0000 mg | Freq: Two times a day (BID) | INTRAVENOUS | Status: DC
  Administered 2014-06-07 – 2014-06-10 (×8): 20 mg via INTRAVENOUS
  Filled 2014-06-07 (×11): qty 50

## 2014-06-07 MED ORDER — AMIODARONE HCL IN DEXTROSE 360-4.14 MG/200ML-% IV SOLN
60.0000 mg/h | INTRAVENOUS | Status: DC
  Filled 2014-06-07: qty 200

## 2014-06-07 MED ORDER — HEPARIN BOLUS VIA INFUSION
4000.0000 [IU] | Freq: Once | INTRAVENOUS | Status: AC
  Administered 2014-06-07: 4000 [IU] via INTRAVENOUS
  Filled 2014-06-07: qty 4000

## 2014-06-07 MED ORDER — CETYLPYRIDINIUM CHLORIDE 0.05 % MT LIQD
7.0000 mL | Freq: Four times a day (QID) | OROMUCOSAL | Status: DC
  Administered 2014-06-08 – 2014-06-09 (×7): 7 mL via OROMUCOSAL

## 2014-06-07 MED ORDER — INSULIN ASPART 100 UNIT/ML ~~LOC~~ SOLN
0.0000 [IU] | SUBCUTANEOUS | Status: DC
  Administered 2014-06-07: 3 [IU] via SUBCUTANEOUS
  Administered 2014-06-10: 4 [IU] via SUBCUTANEOUS
  Administered 2014-06-10: 3 [IU] via SUBCUTANEOUS
  Administered 2014-06-11: 4 [IU] via SUBCUTANEOUS
  Administered 2014-06-11 (×2): 3 [IU] via SUBCUTANEOUS

## 2014-06-07 MED ORDER — AMIODARONE HCL IN DEXTROSE 360-4.14 MG/200ML-% IV SOLN: 30.0000 mg/h | INTRAVENOUS | Status: DC

## 2014-06-07 MED ORDER — SODIUM CHLORIDE 0.9 % IV SOLN
Freq: Once | INTRAVENOUS | Status: AC
  Administered 2014-06-07: 14:00:00 via INTRAVENOUS

## 2014-06-07 MED ORDER — PROPOFOL 10 MG/ML IV EMUL
5.0000 ug/kg/min | Freq: Once | INTRAVENOUS | Status: AC
  Administered 2014-06-07: 20 ug/kg/min via INTRAVENOUS

## 2014-06-07 MED ORDER — FENTANYL CITRATE 0.05 MG/ML IJ SOLN
25.0000 ug | INTRAMUSCULAR | Status: DC | PRN
  Administered 2014-06-08 – 2014-06-09 (×2): 100 ug via INTRAVENOUS
  Administered 2014-06-09: 75 ug via INTRAVENOUS
  Administered 2014-06-09: 50 ug via INTRAVENOUS
  Administered 2014-06-09 (×2): 100 ug via INTRAVENOUS
  Filled 2014-06-07 (×6): qty 2

## 2014-06-07 MED ORDER — HEPARIN (PORCINE) IN NACL 100-0.45 UNIT/ML-% IJ SOLN
1450.0000 [IU]/h | INTRAMUSCULAR | Status: DC
  Administered 2014-06-07 – 2014-06-09 (×3): 1300 [IU]/h via INTRAVENOUS
  Administered 2014-06-10: 1000 [IU]/h via INTRAVENOUS
  Filled 2014-06-07 (×8): qty 250

## 2014-06-07 MED ORDER — IOHEXOL 350 MG/ML SOLN
100.0000 mL | Freq: Once | INTRAVENOUS | Status: AC | PRN
  Administered 2014-06-07: 100 mL via INTRAVENOUS

## 2014-06-07 MED ORDER — SUCCINYLCHOLINE CHLORIDE 20 MG/ML IJ SOLN
INTRAMUSCULAR | Status: AC | PRN
  Administered 2014-06-07: 100 mg via INTRAVENOUS

## 2014-06-07 MED ORDER — ASPIRIN 300 MG RE SUPP: 300.0000 mg | RECTAL | Status: DC

## 2014-06-07 MED ORDER — ALBUTEROL SULFATE (2.5 MG/3ML) 0.083% IN NEBU: 2.5000 mg | INHALATION_SOLUTION | RESPIRATORY_TRACT | Status: DC | PRN

## 2014-06-07 NOTE — Progress Notes (Signed)
  Echocardiogram 2D Echocardiogram has been performed.  Rickey Rice 06/07/2014, 5:01 PM

## 2014-06-07 NOTE — ED Notes (Signed)
Abnormal labs given to Dr. Kathrynn Humble

## 2014-06-07 NOTE — Progress Notes (Signed)
ANTICOAGULATION CONSULT NOTE - Follow Up Consult  Pharmacy Consult for heparin Indication: ACS vs PE  Labs:  Recent Labs  06/07/14 1350 06/07/14 1357 06/07/14 1755 06/07/14 2200  HGB 10.5* 11.6*  --   --   HCT 32.9* 34.0*  --   --   PLT 164  --   --   --   APTT 31  --  64*  --   LABPROT 15.0  --  15.6*  --   INR 1.17  --  1.23  --   HEPARINUNFRC  --   --   --  0.49  CREATININE 1.14 1.10  --   --   TROPONINI <0.03  --   --   --     Assessment/Plan:  74yo male therapeutic on heparin with initial dosing s/p cardiac arrest. Will continue gtt at current rate and confirm stable with am labs.   Wynona Neat, PharmD, BCPS  06/07/2014,10:42 PM

## 2014-06-07 NOTE — ED Notes (Signed)
Gave Personal belongings to Derold Dorsch, wife to [name].

## 2014-06-07 NOTE — Progress Notes (Signed)
Pt. arrived s/p CPR via [facility] with R nasal airway in place being bagged with AMBU, spontaneous resperations noted by ED staff, placed on NRB mask prior to Intubation while Emergency equipment set up, MD @ bedside, RT to monitor.

## 2014-06-07 NOTE — ED Notes (Signed)
No CT head indicated per admitting MD. Will obtain ct angio prior to transport to [facility].

## 2014-06-07 NOTE — ED Notes (Addendum)
Going to CT 1 with patient, will verify ET and OG placement. MD talking with family.

## 2014-06-07 NOTE — Procedures (Signed)
PROCEDURE NOTE: CVL PLACEMENT  INDICATION:    Monitoring of central venous pressures and/or administration of medications optimally administered in central vein  CONSENT:   Risks of procedure as well as the alternatives were explained to the patient or surrogate. Consent for procedure obtained. A time out was performed.   PROCEDURE  Sterile technique was used including antiseptics, cap, gloves, gown, hand hygiene, mask and full body sheet.  Skin prep: Chlorhexidine; local anesthetic administered  A triple lumen catheter was placed in the L [place] vein using the Seldinger technique.  Ultrasound was used for vessel identification and guidance.   EVALUATION:  Blood flow good  Complications: No apparent complications  Patient tolerated the procedure well.  X-ray ordered to verify placement and is pending   Merton Border, MD PCCM service Mobile [phone]

## 2014-06-07 NOTE — Consult Note (Signed)
NEURO HOSPITALIST CONSULT NOTE    Reason for Consult: EEG request in patient with altered mental status after cardiac arrest  HPI:                                                                                                                                          Rickey Rice is an 74 y.o. male with a past medical history significant for HTN, DM type II, dyslipidemia, morbid obesity, and hypothyroidism, suffered cardiac arrest 3/13 while attending church. Initial rhythm was PEA. Subsequently VF. ROSC in approx 15 mins. Was purposeful in ED but required intubation after he removed [facility] airway.  Patient undergoing hypothermia protocol. Neurology asked to see patient in order to pursue EEG. Patient intubated on the vent. IV team at the bedside trying to get peripheral vein access. As per patient's nurse, no observed abnormal movements.  Past Medical History  Diagnosis Date  . Diabetes mellitus without complication     type 2  . Hypertension   . Hypothyroidism   . Morbid obesity   . Dyslipidemia   . LBBB (left bundle branch block)   . History of nuclear stress test 04/2012    lexiscan - 2 day protocol; low risk study, evidence of inferrior & apical scar but no ischemia   . Arthritis   . Shortness of breath     with exertion   . Prostate enlargement     Past Surgical History  Procedure Laterality Date  . Mouth surgery  1963  . Finger surgery  1954  . Colonoscopy w/ biopsies    . Knee arthroscopy Left   . Total knee arthroplasty Right 02/05/2013    Dr Mayer Camel  . Total knee arthroplasty Right 02/05/2013    Procedure: TOTAL KNEE ARTHROPLASTY;  Surgeon: Kerin Salen, MD;  Location: [place];  Service: Orthopedics;  Laterality: Right;  . Total knee arthroplasty Left 01/09/2014    DR Mayer Camel  . Total knee arthroplasty Left 01/19/2014    Procedure: LEFT TOTAL KNEE ARTHROPLASTY;  Surgeon: Kerin Salen, MD;  Location: [place];  Service: Orthopedics;  Laterality: Left;     Family History  Problem Relation Age of Onset  . Cancer Mother   . Kidney disease Brother   . Hypertension Sister     Family History: unable to obtain due to mental status/intubation  Social History:  reports that he has never smoked. He has never used smokeless tobacco. He reports that he does not drink alcohol or use illicit drugs.  Allergies  Allergen Reactions  . Bee Venom Swelling    Swelling on lips and tongue  . Codeine   . Demerol [Meperidine]   . Pioglitazone     Lips swelling  . Shrimp [Shellfish Allergy]  rash  . Sulfa Antibiotics     MEDICATIONS:                                                                                                                     Scheduled: . aspirin  325 mg Per Tube Daily  . famotidine (PEPCID) IV  20 mg Intravenous Q12H  . insulin aspart  0-20 Units Subcutaneous 6 times per day  . [START ON 06/08/2014] levothyroxine  75 mcg Per Tube Daily  . rosuvastatin  10 mg Per Tube q1800     ROS: unable to obtain due to mental status                                                                                                                                       History obtained from chart review  Physical exam: morbidly obese gentleman intubated on the vent. Blood pressure 106/60, pulse 58, temperature 95.7 F (35.4 C), temperature source Core (Comment), resp. rate 17, height 6' (1.829 m), weight 151.955 kg (335 lb), SpO2 100 %. Head: normocephalic. Neck: supple, no bruits, no JVD. Cardiac: no murmurs. Lungs: clear. Abdomen: soft, no tender, no mass. Extremities: no edema. Skin: no rash   Neurologic Examination:                                                                                                      PROPOFOL WAS TURNED OFF FOR THIS EXAM. Mental status: unresponsive. CN 2-12: pupils 2 mm, poorly reactive to light. EOM absent on Doll's maneuver. No nystagmus. Very weak corneal reflexes left>right. Face seems to  be symmetric. Tongue: intubated. Motor: no spontaneous motor movements. Sensory: reacts to painful stimuli by minimally withdrawing legs. DTR's: unable to elicit. Plantars: no tested. Coordination an gait: unable to test.   No results found for: CHOL  Results for orders placed or performed during the hospital encounter of 06/07/14 (from the past 48 hour(s))  CBC with Differential/Platelet     Status: Abnormal  Collection Time: 06/07/14  1:50 PM  Result Value Ref Range   WBC 9.2 4.0 - 10.5 K/uL   RBC 3.50 (L) 4.22 - 5.81 MIL/uL   Hemoglobin 10.5 (L) 13.0 - 17.0 g/dL   HCT 32.9 (L) 39.0 - 52.0 %   MCV 94.0 78.0 - 100.0 fL   MCH 30.0 26.0 - 34.0 pg   MCHC 31.9 30.0 - 36.0 g/dL   RDW 13.9 11.5 - 15.5 %   Platelets 164 150 - 400 K/uL   Neutrophils Relative % 60 43 - 77 %   Neutro Abs 5.6 1.7 - 7.7 K/uL   Lymphocytes Relative 30 12 - 46 %   Lymphs Abs 2.7 0.7 - 4.0 K/uL   Monocytes Relative 8 3 - 12 %   Monocytes Absolute 0.7 0.1 - 1.0 K/uL   Eosinophils Relative 2 0 - 5 %   Eosinophils Absolute 0.1 0.0 - 0.7 K/uL   Basophils Relative 0 0 - 1 %   Basophils Absolute 0.0 0.0 - 0.1 K/uL  Comprehensive metabolic panel     Status: Abnormal   Collection Time: 06/07/14  1:50 PM  Result Value Ref Range   Sodium 142 135 - 145 mmol/L   Potassium 3.5 3.5 - 5.1 mmol/L   Chloride 109 96 - 112 mmol/L   CO2 23 19 - 32 mmol/L   Glucose, Bld 224 (H) 70 - 99 mg/dL   BUN 22 6 - 23 mg/dL   Creatinine, Ser 1.14 0.50 - 1.35 mg/dL   Calcium 7.8 (L) 8.4 - 10.5 mg/dL   Total Protein 5.7 (L) 6.0 - 8.3 g/dL   Albumin 2.9 (L) 3.5 - 5.2 g/dL   AST 450 (H) 0 - 37 U/L   ALT 637 (H) 0 - 53 U/L   Alkaline Phosphatase 98 39 - 117 U/L   Total Bilirubin 0.4 0.3 - 1.2 mg/dL   GFR calc non Af Amer 62 (L) >90 mL/min   GFR calc Af Amer 72 (L) >90 mL/min    Comment: (NOTE) The eGFR has been calculated using the CKD EPI equation. This calculation has not been validated in all clinical situations. eGFR's  persistently <90 mL/min signify possible Chronic Kidney Disease.    Anion gap 10 5 - 15  Magnesium     Status: None   Collection Time: 06/07/14  1:50 PM  Result Value Ref Range   Magnesium 1.7 1.5 - 2.5 mg/dL  Phosphorus     Status: Abnormal   Collection Time: 06/07/14  1:50 PM  Result Value Ref Range   Phosphorus 4.8 (H) 2.3 - 4.6 mg/dL  Troponin I     Status: None   Collection Time: 06/07/14  1:50 PM  Result Value Ref Range   Troponin I <0.03 <0.031 ng/mL    Comment:        NO INDICATION OF MYOCARDIAL INJURY.   Protime-INR     Status: None   Collection Time: 06/07/14  1:50 PM  Result Value Ref Range   Prothrombin Time 15.0 11.6 - 15.2 seconds   INR 1.17 0.00 - 1.49  APTT     Status: None   Collection Time: 06/07/14  1:50 PM  Result Value Ref Range   aPTT 31 24 - 37 seconds  I-Stat Troponin, ED (not at [facility]     Status: None   Collection Time: 06/07/14  1:54 PM  Result Value Ref Range   Troponin i, poc 0.01 0.00 - 0.08 ng/mL   Comment 3  Comment: Due to the release kinetics of cTnI, a negative result within the first hours of the onset of symptoms does not rule out myocardial infarction with certainty. If myocardial infarction is still suspected, repeat the test at appropriate intervals.   I-Stat CG4 Lactic Acid, ED     Status: Abnormal   Collection Time: 06/07/14  1:57 PM  Result Value Ref Range   Lactic Acid, Venous 3.24 (HH) 0.5 - 2.0 mmol/L   Comment NOTIFIED PHYSICIAN   I-stat chem 8, ed     Status: Abnormal   Collection Time: 06/07/14  1:57 PM  Result Value Ref Range   Sodium 146 (H) 135 - 145 mmol/L   Potassium 3.4 (L) 3.5 - 5.1 mmol/L   Chloride 103 96 - 112 mmol/L   BUN 25 (H) 6 - 23 mg/dL   Creatinine, Ser 1.10 0.50 - 1.35 mg/dL   Glucose, Bld 227 (H) 70 - 99 mg/dL   Calcium, Ion 1.16 1.13 - 1.30 mmol/L   TCO2 21 0 - 100 mmol/L   Hemoglobin 11.6 (L) 13.0 - 17.0 g/dL   HCT 34.0 (L) 39.0 - 52.0 %  CBG monitoring, ED     Status: Abnormal    Collection Time: 06/07/14  2:07 PM  Result Value Ref Range   Glucose-Capillary 228 (H) 70 - 99 mg/dL   Comment 1 Notify RN   Urinalysis, Routine w reflex microscopic     Status: Abnormal   Collection Time: 06/07/14  3:00 PM  Result Value Ref Range   Color, Urine YELLOW YELLOW   APPearance CLOUDY (A) CLEAR   Specific Gravity, Urine 1.017 1.005 - 1.030   pH 6.5 5.0 - 8.0   Glucose, UA 250 (A) NEGATIVE mg/dL   Hgb urine dipstick LARGE (A) NEGATIVE   Bilirubin Urine NEGATIVE NEGATIVE   Ketones, ur NEGATIVE NEGATIVE mg/dL   Protein, ur >300 (A) NEGATIVE mg/dL   Urobilinogen, UA 1.0 0.0 - 1.0 mg/dL   Nitrite NEGATIVE NEGATIVE   Leukocytes, UA NEGATIVE NEGATIVE  Urine microscopic-add on     Status: Abnormal   Collection Time: 06/07/14  3:00 PM  Result Value Ref Range   Squamous Epithelial / LPF RARE RARE   WBC, UA 0-2 <3 WBC/hpf   RBC / HPF TOO NUMEROUS TO COUNT <3 RBC/hpf   Bacteria, UA RARE RARE   Casts GRANULAR CAST (A) NEGATIVE  I-Stat arterial blood gas, ED     Status: Abnormal   Collection Time: 06/07/14  3:10 PM  Result Value Ref Range   pH, Arterial 7.284 (L) 7.350 - 7.450   pCO2 arterial 55.4 (H) 35.0 - 45.0 mmHg   pO2, Arterial 263.0 (H) 80.0 - 100.0 mmHg   Bicarbonate 26.7 (H) 20.0 - 24.0 mEq/L   TCO2 28 0 - 100 mmol/L   O2 Saturation 100.0 %   Acid-base deficit 1.0 0.0 - 2.0 mmol/L   Patient temperature 35.9 C    Collection site RADIAL, ALLEN'S TEST ACCEPTABLE    Drawn by Operator    Sample type ARTERIAL    Comment NOTIFIED PHYSICIAN     Ct Angio Chest Pe W/cm &/or Wo Cm  06/07/2014   CLINICAL DATA:  74 year old male with witnessed cardiac arrest this morning, status post CPR.  EXAM: CT ANGIOGRAPHY CHEST WITH CONTRAST  TECHNIQUE: Multidetector CT imaging of the chest was performed using the standard protocol during bolus administration of intravenous contrast. Multiplanar CT image reconstructions and MIPs were obtained to evaluate the vascular anatomy.  CONTRAST:  141m OMNIPAQUE IOHEXOL 350 MG/ML SOLN  COMPARISON:  No priors.  FINDINGS: Study is exceedingly limited for evaluation of pulmonary embolism secondary to patient's large body habitus, inability to images a patient with the arms in the up position, and suboptimal contrast bolus.  Mediastinum/Lymph Nodes: No central or lobar filling defect is noted to suggest large pulmonary embolism. Smaller segmental and subsegmental size filling defects cannot be excluded on today's examination secondary to the limitations discussed above. Pulmonic trunk is dilated measuring 4.1 cm in diameter, suggesting underlying pulmonary arterial hypertension. Cardiomegaly with left ventricular dilatation. There is atherosclerosis of the thoracic aorta, the great vessels of the mediastinum and the coronary arteries, including calcified atherosclerotic plaque in the left main, left anterior descending and right coronary arteries. There is no significant pericardial fluid, thickening or pericardial calcification. No pathologically enlarged mediastinal or hilar lymph nodes. Esophagus is unremarkable in appearance. No axillary lymphadenopathy. Nasogastric tube extends at least to the stomach (it extends below the lower margin of the images). Endotracheal tube in position with tip terminating approximately 2.4 cm above the carina.  Lungs/Pleura: Dependent subsegmental atelectasis in the lower lobes of the lungs bilaterally (right greater than left). Small amount of dependent airspace consolidation in the posterior aspect of the right upper lobe and in the inferior right lower lobe which may suggest mild aspiration given the patient's history. No pneumothorax. No pleural effusions.  Upper Abdomen: Small calcified gallstone in the gallbladder. Diffuse low attenuation of the hepatic parenchyma, indicative of hepatic steatosis.  Musculoskeletal/Soft Tissues: No acute displaced fractures or aggressive appearing lytic or blastic lesions are noted in the  visualized portions of the skeleton.  Review of the MIP images confirms the above findings.  IMPRESSION: 1. Exceedingly limited examination demonstrating no central or lobar sized pulmonary embolism. Segmental and subsegmental sized emboli cannot be excluded. 2. Dependent airspace consolidation in the posterior right upper lobe and dependent portion of the right lower lobe, likely sequela of mild aspiration. 3. Cardiomegaly with left ventricular dilatation. 4. Dilatation of the pulmonic trunk (4.1 cm in diameter), suggestive of pulmonary arterial hypertension. 5. Atherosclerosis, including left main and 2 vessel coronary artery disease. Assessment for potential risk factor modification, dietary therapy or pharmacologic therapy may be warranted, if clinically indicated. 6. Mild dependent subsegmental atelectasis in the lower lobes of the lungs bilaterally. 7. Hepatic steatosis. 8. Cholelithiasis. 9. Support apparatus, as above.   Electronically Signed   By: DVinnie LangtonM.D.   On: 06/07/2014 16:33   Dg Chest Port 1 View  06/07/2014   CLINICAL DATA:  Central line placement.  EXAM: PORTABLE CHEST - 1 VIEW  COMPARISON:  01/20/2014  FINDINGS: Endotracheal tube has tip 3.8 cm above the carina. Nasogastric tube courses into the region of the stomach and off the inferior portion of the film. Left subclavian central venous catheter is present with tip in the region of the SVC at the level of the carina. No evidence of pneumothorax.  Lungs are moderately hypoinflated with stable elevation of the right hemidiaphragm. Mild stable cardiomegaly. Remainder the exam is unchanged.  IMPRESSION: Hypoinflation without evidence of acute disease. Mild stable cardiomegaly.  Tubes and lines as described.   Electronically Signed   By: DMarin OlpM.D.   On: 06/07/2014 17:30   Assessment/Plan: 74y/o with severe post cardiac arrest anoxic ischemic encephalopathy, currently undergoing hypothermia protocol. No paroxysmal movements  noted at this time. EEG. Will follow up.  ODorian Pod MD 06/07/2014, 6:01 PM

## 2014-06-07 NOTE — Procedures (Signed)
Arterial Catheter Insertion Procedure Note Rickey Rice [id number] 1941-03-02  Procedure: Insertion of Arterial Catheter  Indications: Blood pressure monitoring  Procedure Details Consent: Risks of procedure as well as the alternatives and risks of each were explained to the (patient/caregiver).  Consent for procedure obtained. Time Out: Verified patient identification, verified procedure, site/side was marked, verified correct patient position, special equipment/implants available, medications/allergies/relevent history reviewed, required imaging and test results available.  Performed  Maximum sterile technique was used including antiseptics, cap, gloves, gown, hand hygiene, mask and sheet. Skin prep: Chlorhexidine; local anesthetic administered 20 gauge catheter was inserted into left radial artery using the Seldinger technique.  Evaluation Blood flow good; BP tracing good. Complications: No apparent complications.   Jetty Peeks 06/07/2014

## 2014-06-07 NOTE — Progress Notes (Addendum)
Patient ID: Rickey Rice, male   DOB: 05-May-1940, 74 y.o.   MRN: [id number] Limited echo reviewed at bedside Moderate LVE abnormal septal motion from LBBB  Diffuse hypokinesis EF about 40% Normal RV no signs of cor pulmonale No signs of disecction or critial aortic / mitral disese No pericardial effusion  Jenkins Rouge

## 2014-06-07 NOTE — H&P (Addendum)
PULMONARY / CRITICAL CARE MEDICINE   Name: FREDERIC TONES MRN: [id number] DOB: 1940/08/08    ADMISSION DATE:  06/07/2014  INITIAL PRESENTATION:  31 M with obesity, prior bilateral knee surgeries, chronic LBBB, mild cardiomyopathy suffered cardiac arrest while attending church. Initial rhythm was PEA. Subsequently VF. ROSC in approx 15 mins. Was purposeful in ED but required intubation after he removed [facility] airway. Code cool called and PCCM admitted    MAJOR EVENTS/TEST RESULTS: 3/13 admitted as above  INDWELLING DEVICES:: ETT 3/13 >>  L [place] CVl 3/13 >>  Radial srt line (ordered) 3/13 >>   MICRO DATA: Blood 3/13 >>   ANTIMICROBIALS:      HISTORY OF PRESENT ILLNESS:   As above. Was in [place] prior to events described above  PAST MEDICAL HISTORY :   has a past medical history of Diabetes mellitus without complication; Hypertension; Hypothyroidism; Morbid obesity; Dyslipidemia; LBBB (left bundle branch block); History of nuclear stress test (04/2012); Arthritis; Shortness of breath; and Prostate enlargement.  has past surgical history that includes Mouth surgery (1963); Finger surgery (1954); Colonoscopy w/ biopsies; Knee arthroscopy (Left); Total knee arthroplasty (Right, 02/05/2013); Total knee arthroplasty (Right, 02/05/2013); Total knee arthroplasty (Left, 01/09/2014); and Total knee arthroplasty (Left, 01/19/2014). Prior to Admission medications   Medication Sig Start Date End Date Taking? Authorizing Provider  acetaminophen (TYLENOL) 500 MG tablet Take 1,000 mg by mouth every 6 (six) hours as needed (pain).    Yes Historical Provider, MD  [place]-Hydrochlorothiazide (TEKTURNA HCT) 300-25 MG TABS Take 1 tablet by mouth daily.   Yes Historical Provider, MD  amLODipine (NORVASC) 5 MG tablet Take 5 mg by mouth 2 (two) times daily.    Yes Historical Provider, MD  amoxicillin (AMOXIL) 500 MG capsule Take 2,000 mg by mouth as directed. Take 2 capsules 1 hour prior to dental  procedures and 2 capsules 1 hour after   Yes Historical Provider, MD  aspirin EC 81 MG tablet Take 81 mg by mouth daily.   Yes Historical Provider, MD  celecoxib (CELEBREX) 200 MG capsule Take 200 mg by mouth daily.   Yes Historical Provider, MD  glimepiride (AMARYL) 4 MG tablet Take 8 mg by mouth daily before breakfast.    Yes Historical Provider, MD  ipratropium (ATROVENT HFA) 17 MCG/ACT inhaler Inhale 2 puffs into the lungs every 6 (six) hours.   Yes Historical Provider, MD  levothyroxine (SYNTHROID, LEVOTHROID) 75 MCG tablet Take 75 mcg by mouth daily before breakfast.   Yes Historical Provider, MD  metFORMIN (GLUCOPHAGE-XR) 500 MG 24 hr tablet Take 1,000 mg by mouth daily with breakfast.   Yes Historical Provider, MD  oxyCODONE-acetaminophen (ROXICET) 5-325 MG per tablet Take 1 tablet by mouth every 4 (four) hours as needed. 01/19/14  Yes Leighton Parody, PA-C  rosuvastatin (CRESTOR) 10 MG tablet Take 10 mg by mouth daily.   Yes Historical Provider, MD  saxagliptin HCl (ONGLYZA) 5 MG TABS tablet Take 5 mg by mouth daily.   Yes Historical Provider, MD  tamsulosin (FLOMAX) 0.4 MG CAPS capsule Take 1 capsule by mouth daily. 10/07/13  Yes Historical Provider, MD  aspirin EC 325 MG tablet Take 1 tablet (325 mg total) by mouth 2 (two) times daily. 01/19/14   Leighton Parody, PA-C  LUTEIN ESTERS PO Take 1 capsule by mouth every other day.     Historical Provider, MD   Allergies  Allergen Reactions  . Bee Venom Swelling    Swelling on lips and tongue  . Codeine   .  Demerol [Meperidine]   . Pioglitazone     Lips swelling  . Shrimp [Shellfish Allergy]     rash  . Sulfa Antibiotics     FAMILY HISTORY:  has no family status information on file.  SOCIAL HISTORY:  reports that he has never smoked. He has never used smokeless tobacco. He reports that he does not drink alcohol or use illicit drugs.  REVIEW OF SYSTEMS:  unavailable  SUBJECTIVE:   VITAL SIGNS: Temp:  [96.6 F (35.9 C)-96.8 F  (36 C)] 96.8 F (36 C) (03/13 1440) Pulse Rate:  [65-94] 65 (03/13 1510) Resp:  [17-26] 17 (03/13 1510) BP: (90-139)/(45-81) 90/45 mmHg (03/13 1510) SpO2:  [92 %-100 %] 100 % (03/13 1510) FiO2 (%):  [100 %] 100 % (03/13 1444) Weight:  [151.5 kg (334 lb)-151.955 kg (335 lb)] 151.955 kg (335 lb) (03/13 1444) HEMODYNAMICS:   VENTILATOR SETTINGS: Vent Mode:  [-] PRVC FiO2 (%):  [100 %] 100 % Set Rate:  [16 bmp-18 bmp] 16 bmp Vt Set:  [500 mL-600 mL] 500 mL PEEP:  [5 cmH20] 5 cmH20 Plateau Pressure:  [21 cmH20] 21 cmH20 INTAKE / OUTPUT:  Intake/Output Summary (Last 24 hours) at 06/07/14 1605 Last data filed at 06/07/14 1401  Gross per 24 hour  Intake    250 ml  Output      0 ml  Net    250 ml    PHYSICAL EXAMINATION: General: obese, intubated, mild psychomotor agitation, not F/C Neuro:  RASS -2, not F/C, MAEs, CNs intact HEENT: NCAT, WNL Cardiovascular: regular, distant HS, no M noted Lungs: slightly coarse BS throughout without wheezes Abdomen:  Obese, soft, NT, + BS Ext: B well healed knee surgical scars, L>R edema  LABS:  CBC  Recent Labs Lab 06/07/14 1350 06/07/14 1357  WBC 9.2  --   HGB 10.5* 11.6*  HCT 32.9* 34.0*  PLT 164  --    Coag's  Recent Labs Lab 06/07/14 1350  APTT 31  INR 1.17   BMET  Recent Labs Lab 06/07/14 1350 06/07/14 1357  NA 142 146*  K 3.5 3.4*  CL 109 103  CO2 23  --   BUN 22 25*  CREATININE 1.14 1.10  GLUCOSE 224* 227*   Electrolytes  Recent Labs Lab 06/07/14 1350  CALCIUM 7.8*  MG 1.7  PHOS 4.8*   Sepsis Markers  Recent Labs Lab 06/07/14 1357  LATICACIDVEN 3.24*   ABG  Recent Labs Lab 06/07/14 1510  PHART 7.284*  PCO2ART 55.4*  PO2ART 263.0*   Liver Enzymes  Recent Labs Lab 06/07/14 1350  AST 450*  ALT 637*  ALKPHOS 98  BILITOT 0.4  ALBUMIN 2.9*   Cardiac Enzymes  Recent Labs Lab 06/07/14 1350  TROPONINI <0.03   Glucose  Recent Labs Lab 06/07/14 1407  GLUCAP 228*     Imaging No results found.   ASSESSMENT / PLAN:  CARDIOVASCULAR A:  PEA > VF arrest Chronic LBBB H/O htn H/O hyperlipidemia Mild cardiomyopathy - recent LVEF 50% P:  MAP goal > 65 mmHg Cont statin Holding amlodipine R/O MI with serial markers STAT Echo CTA chest to R/O PE Ful anticoagulation until PE and ACS both R/O'd Cards to see 3/14 Recheck EKG AM 3/14   PULMONARY A: Acute resp failure post arrest P:   Full vent support - settings reviewed and adjusted Vent bundle Daily SBT if/when meets criteria   RENAL A:   Mild hypernatremia Mild hypokalemia P:   Monitor BMET intermittently Monitor I/Os Correct  electrolytes as indicated  GASTROINTESTINAL A:   Obesity P:   SUP: IV famotidine Begin TFs 3/14 if not extubated  HEMATOLOGIC A:   Mild anemia without overt blood loss P:  DVT px: full dose heparin Monitor CBC intermittently Transfuse per usual ICU guidelines   INFECTIOUS A:   No identified infections P:   Monitor temp, WBC count Micro and abx as above  ENDOCRINE A:   DM 2 Hypothyroidism  P:   SSI - resistant scale Holding oral DM meds Levothyroxine @ home dose of 75 mcg daily Check TSH AM 3/14  NEUROLOGIC A:   Post anoxic/ischemic encephalopathy P:   RASS goal: -1 Normothermia protocol Propofol PRN fent   FAMILY  - Updates: extended family updated @ bedside   45 mins CCM time    Merton Border, MD ; [facility] service Mobile [phone].  After 5:30 PM or weekends, call [phone] Pulmonary and [place] Pager: [phone]  06/07/2014, 4:05 PM

## 2014-06-07 NOTE — Progress Notes (Addendum)
ANTICOAGULATION CONSULT NOTE - Initial Consult  Pharmacy Consult for heparin Indication: chest pain/ACS  Allergies  Allergen Reactions  . Bee Venom Swelling    Swelling on lips and tongue  . Codeine   . Demerol [Meperidine]   . Pioglitazone     Lips swelling  . Shrimp [Shellfish Allergy]     rash  . Sulfa Antibiotics     Patient Measurements: Weight: (!) 334 lb (151.5 kg) Heparin Dosing Weight: 109kg  Vital Signs: BP: 117/76 mmHg (03/13 1400) Pulse Rate: 94 (03/13 1400)  Labs:  Recent Labs  06/07/14 1357  HGB 11.6*  HCT 34.0*  CREATININE 1.10    Estimated Creatinine Clearance: 88.3 mL/min (by C-G formula based on Cr of 1.1).   Medical History: Past Medical History  Diagnosis Date  . Diabetes mellitus without complication     type 2  . Hypertension   . Hypothyroidism   . Morbid obesity   . Dyslipidemia   . LBBB (left bundle branch block)   . History of nuclear stress test 04/2012    lexiscan - 2 day protocol; low risk study, evidence of inferrior & apical scar but no ischemia   . Arthritis   . Shortness of breath     with exertion   . Prostate enlargement     Medications:  Infusions:  . sodium chloride    . heparin    . heparin    . propofol    . sodium chloride 1,000 mL (06/07/14 1405)  . sodium chloride      Assessment: 45 yom presented to the ED after collapsing and requiring CPR. Received 3 shocks and was in PEA when EMS arrived. Now initiating heparin for ACS. Baseline H/H is slightly low via iSTAT. Full labs are pending. He was not on any anticoagulation PTA. Planning to cool the patient.   Goal of Therapy:  Heparin level 0.3-0.7 units/ml Monitor platelets by anticoagulation protocol: Yes   Plan:  - Heparin bolus 4000 units IV x 1 - Heparin gtt 1100 units/hr - Check a 6 hour heparin level - Daily heparin level and CBC  Herbert Aguinaldo, Rande Lawman 06/07/2014,2:22 PM  Addendum: CCM added indication for possible PE. Will increase rate  slightly to 1300 units/hr in order to achieve adequate anticoagulation faster. However, patients typically require less heparin during hypothermia. Will require close monitoring.  Salome Arnt, PharmD, BCPS Pager # [phone] 06/07/2014 2:35 PM

## 2014-06-07 NOTE — Consult Note (Signed)
CARDIOLOGY CONSULT NOTE       Patient ID: Rickey Rice MRN: [id number] DOB/AGE: May 16, 1940 74 y.o.  Admit date: 06/07/2014 Referring Physician:  Alva Garnet Primary Physician: Elyn Peers, MD Primary Cardiologist:   Reason for Consultation: [name]  Active Problems:   PEA (Pulseless electrical activity)   HPI:  74 yo with no documented CAD.  Chronic LBBB  05/02/12 had normal myovue pre knee surgery with EF 49%.  S/P bilateral TKR;s  CRF  Morbid obesity, elevated lipids and HTN.  [place] at [place] This afternoon  Initial rhythm PEA.  Eventually vfib Had 3 shocks and ROC 15 minutes with CPR.  Unable to take history in ER.  Intubated.  Rhythm sinus and BP stable.  Getting left subclavian line by CCM.  No history of drug or alcohol abuse.  Currently getting cooling pads in place  I reviewed his CTA for PE and poor quality study no obvious PE RV appears normal size  Aorta not enlarged and no  Pericardial effusion.  Stat echo pending Delayed due to central line, CTA and now cooling protocol  ROS All other systems reviewed and negative except as noted above  Past Medical History  Diagnosis Date  . Diabetes mellitus without complication     type 2  . Hypertension   . Hypothyroidism   . Morbid obesity   . Dyslipidemia   . LBBB (left bundle branch block)   . History of nuclear stress test 04/2012    lexiscan - 2 day protocol; low risk study, evidence of inferrior & apical scar but no ischemia   . Arthritis   . Shortness of breath     with exertion   . Prostate enlargement     Family History  Problem Relation Age of Onset  . Cancer Mother   . Kidney disease Brother   . Hypertension Sister     History   Social History  . Marital Status: Married    Spouse Name: N/A  . Number of Children: 2  . Years of Education: N/A   Occupational History  . owner of photography shop    Social History Main Topics  . Smoking status: Never Smoker   . Smokeless tobacco: Never Used  .  Alcohol Use: No  . Drug Use: No  . Sexual Activity: Not on file   Other Topics Concern  . Not on file   Social History Narrative    Past Surgical History  Procedure Laterality Date  . Mouth surgery  1963  . Finger surgery  1954  . Colonoscopy w/ biopsies    . Knee arthroscopy Left   . Total knee arthroplasty Right 02/05/2013    Dr Mayer Camel  . Total knee arthroplasty Right 02/05/2013    Procedure: TOTAL KNEE ARTHROPLASTY;  Surgeon: Kerin Salen, MD;  Location: [facility];  Service: Orthopedics;  Laterality: Right;  . Total knee arthroplasty Left 01/09/2014    DR Mayer Camel  . Total knee arthroplasty Left 01/19/2014    Procedure: LEFT TOTAL KNEE ARTHROPLASTY;  Surgeon: Kerin Salen, MD;  Location: [facility];  Service: Orthopedics;  Laterality: Left;     . sodium chloride   Intravenous Once  . sodium chloride  2,000 mL Intravenous Once  . aspirin  325 mg Per Tube Daily  . famotidine (PEPCID) IV  20 mg Intravenous Q12H  . insulin aspart  0-20 Units Subcutaneous 6 times per day  . [START ON 06/08/2014] levothyroxine  75 mcg Per Tube Daily  . rosuvastatin  10 mg Per Tube q1800   . heparin 1,300 Units/hr (06/07/14 1510)  . norepinephrine (LEVOPHED) Adult infusion    . propofol    . sodium chloride 0.45 % with kcl      Physical Exam: Blood pressure 90/45, pulse 58, temperature 96.8 F (36 C), temperature source Core (Comment), resp. rate 26, height 6' (1.829 m), weight 151.955 kg (335 lb), SpO2 100 %.    Intubated sedated morbidly obese black male  HEENT: ETT tube sats ok  Neck supple with no adenopathy JVP normal no bruits no thyromegaly Lungs rhonchi bilateral breath sounds equal  Heart:  S1/S2 no murmur, no rub, gallop or click PMI  Not palpable  Abdomen: benighn, BS positve, no tenderness, no AAA no bruit.  No HSM or HJR Distal pulses intact with no bruits Foley Left subclavian line    Labs:   Lab Results  Component Value Date   WBC 9.2 06/07/2014   HGB 11.6* 06/07/2014    HCT 34.0* 06/07/2014   MCV 94.0 06/07/2014   PLT 164 06/07/2014    Recent Labs Lab 06/07/14 1350 06/07/14 1357  NA 142 146*  K 3.5 3.4*  CL 109 103  CO2 23  --   BUN 22 25*  CREATININE 1.14 1.10  CALCIUM 7.8*  --   PROT 5.7*  --   BILITOT 0.4  --   ALKPHOS 98  --   ALT 637*  --   AST 450*  --   GLUCOSE 224* 227*   Lab Results  Component Value Date   CKTOTAL 180 09/07/2013   CKMB 1.3 09/25/2008   TROPONINI <0.03 06/07/2014   No results found for: CHOL No results found for: HDL No results found for: LDLCALC No results found for: TRIG No results found for: CHOLHDL No results found for: LDLDIRECT    Radiology: No results found.  EKG:  SR LBBB artifact  No obvious ischemia   ASSESSMENT AND PLAN:  PEA:  No obvious cardiac etiology No antecedent chest pain history chronic LBBB negative enzymes.  PE high on list no d dimer  CTA not officially read yet But poor quality no obvious PE to my review  Stat echo pending for RV/LV function Aorta looked ok on CT and no pericardial effusion Continue cooling Levophed for MAP less than 60 mmHg  Monitor rhythm LBBB chronic no evidence that high grade heart block contributed to event K and Cr ok DCM:  No echo suggestion that EF 49% on last myovue 2014  Echo pending  Chol:  Statin per NG tube   Cardiology to follow with CCM   Signed: Jenkins Rouge 06/07/2014, 4:23 PM

## 2014-06-07 NOTE — ED Notes (Addendum)
Per EMS- [place] at [place] 1300. EMS started by bystanders, AED applied V-fib was shocked 3 times, when EMS arrived PEA. Total CPR of 15 mins. Pulses returned, using BVM. CBG 62, given D50, pt began moaning and groaning. Yellow/white mucus from nose. BP 139/68, 22 RR, 90HR. EMS attempt intubation with Edison Pace, pt pulled it out.

## 2014-06-07 NOTE — Progress Notes (Signed)
[facility] Progress Note Patient Name: Rickey Rice DOB: 03-02-1941 MRN: [id number]   Date of Service  06/07/2014  HPI/Events of Note  Called d/t multiple issues:  1. Review CT angio for central line placement. 2. Order Neurology Consult for EEG monitoring of hypothermia. 3. Potassium = 3.4 and Creatinine = 1.10.  eICU Interventions  Unable to read Chest CTA for central line placement. Will order:  1. Portable CXR STAT. 2. Neurology consult for weekend EEG monitoring of hypothermia.  3. Will replace K+ once central line placement has been verified.      Intervention Category Major Interventions: Change in mental status - evaluation and management  [name] 06/07/2014, 4:59 PM

## 2014-06-07 NOTE — ED Notes (Signed)
Dr. Alva Garnet at bedside placing central line.

## 2014-06-07 NOTE — ED Notes (Signed)
Ice packs applied to groin and axilla

## 2014-06-07 NOTE — ED Notes (Signed)
250 Cool Saline given by EMS. 4 ice packs to groin applied.

## 2014-06-07 NOTE — ED Notes (Signed)
Critical care at bedside  

## 2014-06-07 NOTE — Progress Notes (Signed)
Chaplain assisted family who arrived following pt. Arrival.  MD talked with family.    Chaplain helped family move to [facility] waiting room when RN in ED gave that direction.  Spiritual conversation, comfort measures offered.  Rev. [employer], [place]

## 2014-06-08 ENCOUNTER — Inpatient Hospital Stay (HOSPITAL_COMMUNITY): Payer: Medicare Other

## 2014-06-08 DIAGNOSIS — J9601 Acute respiratory failure with hypoxia: Secondary | ICD-10-CM

## 2014-06-08 DIAGNOSIS — Z8674 Personal history of sudden cardiac arrest: Secondary | ICD-10-CM | POA: Diagnosis present

## 2014-06-08 DIAGNOSIS — I428 Other cardiomyopathies: Secondary | ICD-10-CM | POA: Diagnosis present

## 2014-06-08 LAB — CBC
HEMATOCRIT: 34.1 % — AB (ref 39.0–52.0)
Hemoglobin: 11.1 g/dL — ABNORMAL LOW (ref 13.0–17.0)
MCH: 30.2 pg (ref 26.0–34.0)
MCHC: 32.6 g/dL (ref 30.0–36.0)
MCV: 92.9 fL (ref 78.0–100.0)
Platelets: 154 10*3/uL (ref 150–400)
RBC: 3.67 MIL/uL — ABNORMAL LOW (ref 4.22–5.81)
RDW: 13.9 % (ref 11.5–15.5)
WBC: 5.5 10*3/uL (ref 4.0–10.5)

## 2014-06-08 LAB — GLUCOSE, CAPILLARY
GLUCOSE-CAPILLARY: 97 mg/dL (ref 70–99)
GLUCOSE-CAPILLARY: 117 mg/dL — AB (ref 70–99)
Glucose-Capillary: 102 mg/dL — ABNORMAL HIGH (ref 70–99)
Glucose-Capillary: 107 mg/dL — ABNORMAL HIGH (ref 70–99)
Glucose-Capillary: 113 mg/dL — ABNORMAL HIGH (ref 70–99)
Glucose-Capillary: 96 mg/dL (ref 70–99)

## 2014-06-08 LAB — PROTIME-INR
INR: 1.16 (ref 0.00–1.49)
Prothrombin Time: 15 seconds (ref 11.6–15.2)

## 2014-06-08 LAB — TSH: TSH: 0.344 u[IU]/mL — AB (ref 0.350–4.500)

## 2014-06-08 LAB — HEPARIN LEVEL (UNFRACTIONATED): HEPARIN UNFRACTIONATED: 0.51 [IU]/mL (ref 0.30–0.70)

## 2014-06-08 LAB — BASIC METABOLIC PANEL
Anion gap: 5 (ref 5–15)
BUN: 25 mg/dL — ABNORMAL HIGH (ref 6–23)
CALCIUM: 8.2 mg/dL — AB (ref 8.4–10.5)
CO2: 29 mmol/L (ref 19–32)
Chloride: 106 mmol/L (ref 96–112)
Creatinine, Ser: 1.2 mg/dL (ref 0.50–1.35)
GFR calc Af Amer: 67 mL/min — ABNORMAL LOW (ref >90)
GFR calc non Af Amer: 58 mL/min — ABNORMAL LOW (ref >90)
Glucose, Bld: 123 mg/dL — ABNORMAL HIGH (ref 70–99)
Potassium: 3.9 mmol/L (ref 3.5–5.1)
SODIUM: 140 mmol/L (ref 135–145)

## 2014-06-08 LAB — APTT: aPTT: 100 seconds — ABNORMAL HIGH (ref 24–37)

## 2014-06-08 LAB — D-DIMER, QUANTITATIVE (NOT AT ARMC): D DIMER QUANT: 5.21 ug{FEU}/mL — AB (ref 0.00–0.48)

## 2014-06-08 MED ORDER — POTASSIUM CHLORIDE 20 MEQ/15ML (10%) PO SOLN
40.0000 meq | Freq: Three times a day (TID) | ORAL | Status: AC
  Administered 2014-06-08 (×2): 40 meq via ORAL
  Filled 2014-06-08 (×3): qty 30

## 2014-06-08 MED ORDER — VITAL HIGH PROTEIN PO LIQD
1000.0000 mL | ORAL | Status: DC
  Administered 2014-06-08: 1000 mL
  Filled 2014-06-08 (×4): qty 1000

## 2014-06-08 MED ORDER — FUROSEMIDE 10 MG/ML IJ SOLN
40.0000 mg | Freq: Four times a day (QID) | INTRAMUSCULAR | Status: AC
  Administered 2014-06-08 (×3): 40 mg via INTRAVENOUS
  Filled 2014-06-08 (×4): qty 4

## 2014-06-08 MED ORDER — VITAL HIGH PROTEIN PO LIQD: 1000.0000 mL | ORAL | Status: DC

## 2014-06-08 MED ORDER — SODIUM CHLORIDE 0.9 % IV SOLN
INTRAVENOUS | Status: DC
  Administered 2014-06-08 – 2014-06-10 (×2): via INTRAVENOUS

## 2014-06-08 MED ORDER — POTASSIUM CHLORIDE CRYS ER 20 MEQ PO TBCR
40.0000 meq | EXTENDED_RELEASE_TABLET | Freq: Three times a day (TID) | ORAL | Status: DC
  Administered 2014-06-08: 40 meq via ORAL
  Filled 2014-06-08: qty 2

## 2014-06-08 MED ORDER — PRO-STAT SUGAR FREE PO LIQD
60.0000 mL | Freq: Four times a day (QID) | ORAL | Status: DC
  Administered 2014-06-08 – 2014-06-09 (×4): 60 mL
  Filled 2014-06-08 (×11): qty 60

## 2014-06-08 MED ORDER — MAGNESIUM SULFATE 2 GM/50ML IV SOLN
2.0000 g | Freq: Once | INTRAVENOUS | Status: AC
  Administered 2014-06-08: 2 g via INTRAVENOUS
  Filled 2014-06-08: qty 50

## 2014-06-08 NOTE — Progress Notes (Signed)
Chaplain notified via consult to visit pt's wife.   Pt is sedated and no family present. Will return later.   Delford Field, Chaplain 06/08/2014 11:09 AM

## 2014-06-08 NOTE — Progress Notes (Signed)
INITIAL NUTRITION ASSESSMENT  DOCUMENTATION CODES Per approved criteria  -Morbid Obesity   INTERVENTION: Once rewarmed, initiate Vital High Protein via OGT at goal rate of 20 ml/hr and Prostat 60 ml x 4  With propofol, TF regimen provides 2,240 kcal (93% of estimated energy needs), 162 grams of protein, and 401 ml of free water  NUTRITION DIAGNOSIS: Inadequate oral intake related to inability to eat as evidenced by NPO status.   Goal: Enteral nutrition to provide 60-70% of estimated calorie needs (22-25 kcal/kg ideal body weight) and 100% of estimated protein needs, based on ASPEN guidelines for hypocaloric, high protein feeding in critically ill obese individuals.  Monitor: Propofol rates, weight trends, vent status, labs, I/O's  Reason for Assessment: Consult for enteral/tube feeding initiation and management  74 y.o. male  Admitting Dx: <principal problem not specified>  ASSESSMENT: 74 y/o male with past medical history of obesity, prior bilateral knee surgeries, chronic LBBB, mild cardiomyopathy suffered cardiac arrest while attending church.  Labs- low Ca; high BUN Pt currently intubated on ventilator support on hypothermia protocol. Talked with nurse about weaning propofol, and they will reassess after rewarming. OGT in place. Minute Ventilation: 10.7 Max Temperature: 37  Propofol: 36.4 ml/hr (960 kcal) Calculated energy needs based on current vent and sedation status. Will reassess upon rewarming.   Height: Ht Readings from Last 1 Encounters:  06/07/14 6' (1.829 m)    Weight: Wt Readings from Last 1 Encounters:  06/08/14 336 lb 15.9 oz (152.86 kg)    Ideal Body Weight: 178 lb (80.9 kg)  % Ideal Body Weight: 189%  Wt Readings from Last 10 Encounters:  06/08/14 336 lb 15.9 oz (152.86 kg)  02/03/14 334 lb (151.501 kg)  01/26/14 337 lb (152.862 kg)  01/24/14 325 lb (147.419 kg)  12/11/13 333 lb 4.8 oz (151.184 kg)  09/07/13 325 lb (147.419 kg)  06/20/13 330  lb 6.4 oz (149.868 kg)  02/07/13 330 lb (149.687 kg)  01/07/13 333 lb 4.8 oz (151.184 kg)  05/01/12 328 lb (148.78 kg)    Usual Body Weight: unknown  % Usual Body Weight: -  BMI:  Body mass index is 45.69 kg/(m^2).  Estimated Nutritional Needs: Kcal: 2389  Underfeeding kcal: 7989-2119 Protein: 162-180 grams Fluid: per MD  Skin: intact   Diet Order:    EDUCATION NEEDS: -No education needs identified at this time   Intake/Output Summary (Last 24 hours) at 06/08/14 0926 Last data filed at 06/08/14 0900  Gross per 24 hour  Intake 3735.73 ml  Output   2000 ml  Net 1735.73 ml    Last BM: pta  Labs:   Recent Labs Lab 06/07/14 1350 06/07/14 1357 06/08/14 0234  NA 142 146* 140  K 3.5 3.4* 3.9  CL 109 103 106  CO2 23  --  29  BUN 22 25* 25*  CREATININE 1.14 1.10 1.20  CALCIUM 7.8*  --  8.2*  MG 1.7  --   --   PHOS 4.8*  --   --   GLUCOSE 224* 227* 123*    CBG (last 3)   Recent Labs  06/08/14 0003 06/08/14 0448 06/08/14 0720  GLUCAP 102* 113* 97    Scheduled Meds: . antiseptic oral rinse  7 mL Mouth Rinse QID  . aspirin  325 mg Per Tube Daily  . chlorhexidine  15 mL Mouth Rinse BID  . famotidine (PEPCID) IV  20 mg Intravenous Q12H  . insulin aspart  0-20 Units Subcutaneous 6 times per day  .  levothyroxine  75 mcg Per Tube Daily  . rosuvastatin  10 mg Per Tube q1800    Continuous Infusions: . heparin 1,300 Units/hr (06/08/14 0455)  . norepinephrine (LEVOPHED) Adult infusion Stopped (06/07/14 2035)  . propofol 30 mcg/kg/min (06/08/14 0915)  . sodium chloride 0.45 % with kcl 100 mL/hr at 06/08/14 0444    Past Medical History  Diagnosis Date  . Diabetes mellitus without complication     type 2  . Hypertension   . Hypothyroidism   . Morbid obesity   . Dyslipidemia   . LBBB (left bundle branch block)   . History of nuclear stress test 04/2012    lexiscan - 2 day protocol; low risk study, evidence of inferrior & apical scar but no ischemia    . Arthritis   . Shortness of breath     with exertion   . Prostate enlargement     Past Surgical History  Procedure Laterality Date  . Mouth surgery  1963  . Finger surgery  1954  . Colonoscopy w/ biopsies    . Knee arthroscopy Left   . Total knee arthroplasty Right 02/05/2013    Dr Mayer Camel  . Total knee arthroplasty Right 02/05/2013    Procedure: TOTAL KNEE ARTHROPLASTY;  Surgeon: Kerin Salen, MD;  Location: [facility];  Service: Orthopedics;  Laterality: Right;  . Total knee arthroplasty Left 01/09/2014    DR Mayer Camel  . Total knee arthroplasty Left 01/19/2014    Procedure: LEFT TOTAL KNEE ARTHROPLASTY;  Surgeon: Kerin Salen, MD;  Location: [facility];  Service: Orthopedics;  Laterality: Left;    Wynona Dove, MS Dietetic Intern Pager: [phone]

## 2014-06-08 NOTE — Plan of Care (Signed)
Problem: Phase I Progression Outcomes Goal: Maintain MAP > 85 mmHg Outcome: Progressing MAP >70 for 36 deg cooling protocol Goal: Aspirin unless contraindicated Outcome: Completed/Met Date Met:  06/08/14 Given prior to [facility] admission

## 2014-06-08 NOTE — Progress Notes (Signed)
EEG completed; results pending.    

## 2014-06-08 NOTE — Care Management Note (Addendum)
    Page 1 of 1   06/10/2014     10:21:42 AM CARE MANAGEMENT NOTE 06/10/2014  Patient:  Rickey Rice, Rickey Rice   Account Number:  [id number]  Date Initiated:  06/08/2014  Documentation initiated by:  Elissa Hefty  Subjective/Objective Assessment:   adm w carrest-vent     Action/Plan:   lives w wife, pcp dr v bland   Anticipated DC Date:     Anticipated DC Plan:  [place]         Choice offered to / List presented to:             Status of service:   Medicare Important Message given?  YES (If response is "NO", the following Medicare IM given date fields will be blank) Date Medicare IM given:  06/10/2014 Medicare IM given by:  Elissa Hefty Date Additional Medicare IM given:   Additional Medicare IM given by:    Discharge Disposition:    Per UR Regulation:  Reviewed for med. necessity/level of care/duration of stay  If discussed at New Site of Stay Meetings, dates discussed:    Comments:

## 2014-06-08 NOTE — Progress Notes (Signed)
*  PRELIMINARY RESULTS* Vascular Ultrasound Lower extremity venous duplex has been completed.  Preliminary findings: No obvious DVT noted bilaterally.   Landry Mellow, RDMS, RVT  06/08/2014, 8:15 AM

## 2014-06-08 NOTE — Procedures (Signed)
History: 74 yo M s/p cardiac arrest undergoing hypothermia.   Sedation: fentanyl, propofol  Technique: This is a 17 channel routine scalp EEG performed at the bedside with bipolar and monopolar montages arranged in accordance to the international 10/20 system of electrode placement. One channel was dedicated to EKG recording.    Background: There is no clear PDR, but the background consists predominantly of beta activity with occasional generalized irregular theta and delta activity.    Photic stimulation: Physiologic driving is not performed  EEG Abnormalities: 1) Generalized irregular slow activity 2) Excessive beta activity.   Clinical Interpretation: This EEG is suggestive of a generalized non-specific cerebral dysfunction(encephalopathy). Of note, this can be seen with sedating medications. There was no seizure or seizure predisposition recorded on this study.   Roland Rack, MD [facility] [phone]  If 7pm- 7am, please page neurology on call as listed in [place].

## 2014-06-08 NOTE — Progress Notes (Signed)
PULMONARY / CRITICAL CARE MEDICINE   Name: Rickey Rice MRN: [id number] DOB: 1941/02/19    ADMISSION DATE:  06/07/2014  INITIAL PRESENTATION:  60 M with obesity, prior bilateral knee surgeries, chronic LBBB, mild cardiomyopathy suffered cardiac arrest while attending church. Initial rhythm was shockable via AED then PEA. Subsequently VF. ROSC in approx 15 mins. Was purposeful in ED but required intubation after he removed [employer] airway. Code cool called and PCCM admitted  MAJOR EVENTS/TEST RESULTS: 3/13 admitted as above  INDWELLING DEVICES:: ETT 3/13 >>  L [place] CVl 3/13 >>  Radial srt line (ordered) 3/13 >>   MICRO DATA: Blood 3/13 >>   ANTIMICROBIALS:   SUBJECTIVE: No events overnight.  VITAL SIGNS: Temp:  [95 F (35 C)-97.9 F (36.6 C)] 97.7 F (36.5 C) (03/14 0900) Pulse Rate:  [55-94] 61 (03/14 0900) Resp:  [16-26] 24 (03/14 0900) BP: (84-139)/(42-96) 129/78 mmHg (03/14 0900) SpO2:  [92 %-100 %] 100 % (03/14 0900) Arterial Line BP: (115-160)/(60-84) 143/78 mmHg (03/14 0900) FiO2 (%):  [40 %-100 %] 40 % (03/14 0824) Weight:  [151.2 kg (333 lb 5.4 oz)-152.86 kg (336 lb 15.9 oz)] 152.86 kg (336 lb 15.9 oz) (03/14 0500)  HEMODYNAMICS: CVP:  [7 mmHg-18 mmHg] 18 mmHg  VENTILATOR SETTINGS: Vent Mode:  [-] PRVC FiO2 (%):  [40 %-100 %] 40 % Set Rate:  [16 bmp-18 bmp] 16 bmp Vt Set:  [500 mL-600 mL] 500 mL PEEP:  [5 cmH20] 5 cmH20 Plateau Pressure:  [12 OVZ85-88 cmH20] 12 cmH20  INTAKE / OUTPUT:  Intake/Output Summary (Last 24 hours) at 06/08/14 0931 Last data filed at 06/08/14 0900  Gross per 24 hour  Intake 3735.73 ml  Output   2000 ml  Net 1735.73 ml   PHYSICAL EXAMINATION: General: obese, intubated, following commands in all ext with wake up assessment. Neuro:  RASS -2, not F/C, MAEs, CNs intact HEENT: NCAT, WNL Cardiovascular: regular, distant HS, no M noted Lungs: Coarse BS diffusely. Abdomen:  Obese, soft, NT, + BS Ext: B well healed knee surgical  scars, L>R edema  LABS:  CBC  Recent Labs Lab 06/07/14 1350 06/07/14 1357 06/08/14 0234  WBC 9.2  --  5.5  HGB 10.5* 11.6* 11.1*  HCT 32.9* 34.0* 34.1*  PLT 164  --  154   Coag's  Recent Labs Lab 06/07/14 1350 06/07/14 1755 06/08/14 0234  APTT 31 64* 100*  INR 1.17 1.23 1.16   BMET  Recent Labs Lab 06/07/14 1350 06/07/14 1357 06/08/14 0234  NA 142 146* 140  K 3.5 3.4* 3.9  CL 109 103 106  CO2 23  --  29  BUN 22 25* 25*  CREATININE 1.14 1.10 1.20  GLUCOSE 224* 227* 123*   Electrolytes  Recent Labs Lab 06/07/14 1350 06/08/14 0234  CALCIUM 7.8* 8.2*  MG 1.7  --   PHOS 4.8*  --    Sepsis Markers  Recent Labs Lab 06/07/14 1357  LATICACIDVEN 3.24*   ABG  Recent Labs Lab 06/07/14 1510  PHART 7.284*  PCO2ART 55.4*  PO2ART 263.0*   Liver Enzymes  Recent Labs Lab 06/07/14 1350  AST 450*  ALT 637*  ALKPHOS 98  BILITOT 0.4  ALBUMIN 2.9*   Cardiac Enzymes  Recent Labs Lab 06/07/14 1350  TROPONINI <0.03   Glucose  Recent Labs Lab 06/07/14 1407 06/07/14 1806 06/07/14 2100 06/08/14 0003 06/08/14 0448 06/08/14 0720  GLUCAP 228* 126* 104* 102* 113* 97    Imaging Ct Angio Chest Pe W/cm &/or Wo Cm  06/07/2014   CLINICAL DATA:  74 year old male with witnessed cardiac arrest this morning, status post CPR.  EXAM: CT ANGIOGRAPHY CHEST WITH CONTRAST  TECHNIQUE: Multidetector CT imaging of the chest was performed using the standard protocol during bolus administration of intravenous contrast. Multiplanar CT image reconstructions and MIPs were obtained to evaluate the vascular anatomy.  CONTRAST:  151mL OMNIPAQUE IOHEXOL 350 MG/ML SOLN  COMPARISON:  No priors.  FINDINGS: Study is exceedingly limited for evaluation of pulmonary embolism secondary to patient's large body habitus, inability to images a patient with the arms in the up position, and suboptimal contrast bolus.  Mediastinum/Lymph Nodes: No central or lobar filling defect is noted to  suggest large pulmonary embolism. Smaller segmental and subsegmental size filling defects cannot be excluded on today's examination secondary to the limitations discussed above. Pulmonic trunk is dilated measuring 4.1 cm in diameter, suggesting underlying pulmonary arterial hypertension. Cardiomegaly with left ventricular dilatation. There is atherosclerosis of the thoracic aorta, the great vessels of the mediastinum and the coronary arteries, including calcified atherosclerotic plaque in the left main, left anterior descending and right coronary arteries. There is no significant pericardial fluid, thickening or pericardial calcification. No pathologically enlarged mediastinal or hilar lymph nodes. Esophagus is unremarkable in appearance. No axillary lymphadenopathy. Nasogastric tube extends at least to the stomach (it extends below the lower margin of the images). Endotracheal tube in position with tip terminating approximately 2.4 cm above the carina.  Lungs/Pleura: Dependent subsegmental atelectasis in the lower lobes of the lungs bilaterally (right greater than left). Small amount of dependent airspace consolidation in the posterior aspect of the right upper lobe and in the inferior right lower lobe which may suggest mild aspiration given the patient's history. No pneumothorax. No pleural effusions.  Upper Abdomen: Small calcified gallstone in the gallbladder. Diffuse low attenuation of the hepatic parenchyma, indicative of hepatic steatosis.  Musculoskeletal/Soft Tissues: No acute displaced fractures or aggressive appearing lytic or blastic lesions are noted in the visualized portions of the skeleton.  Review of the MIP images confirms the above findings.  IMPRESSION: 1. Exceedingly limited examination demonstrating no central or lobar sized pulmonary embolism. Segmental and subsegmental sized emboli cannot be excluded. 2. Dependent airspace consolidation in the posterior right upper lobe and dependent portion  of the right lower lobe, likely sequela of mild aspiration. 3. Cardiomegaly with left ventricular dilatation. 4. Dilatation of the pulmonic trunk (4.1 cm in diameter), suggestive of pulmonary arterial hypertension. 5. Atherosclerosis, including left main and 2 vessel coronary artery disease. Assessment for potential risk factor modification, dietary therapy or pharmacologic therapy may be warranted, if clinically indicated. 6. Mild dependent subsegmental atelectasis in the lower lobes of the lungs bilaterally. 7. Hepatic steatosis. 8. Cholelithiasis. 9. Support apparatus, as above.   Electronically Signed   By: Vinnie Langton M.D.   On: 06/07/2014 16:33   Dg Chest Port 1 View  06/07/2014   CLINICAL DATA:  Central line placement.  EXAM: PORTABLE CHEST - 1 VIEW  COMPARISON:  01/20/2014  FINDINGS: Endotracheal tube has tip 3.8 cm above the carina. Nasogastric tube courses into the region of the stomach and off the inferior portion of the film. Left subclavian central venous catheter is present with tip in the region of the SVC at the level of the carina. No evidence of pneumothorax.  Lungs are moderately hypoinflated with stable elevation of the right hemidiaphragm. Mild stable cardiomegaly. Remainder the exam is unchanged.  IMPRESSION: Hypoinflation without evidence of acute disease. Mild stable cardiomegaly.  Tubes and lines as described.   Electronically Signed   By: Marin Olp M.D.   On: 06/07/2014 17:30   ASSESSMENT / PLAN:  CARDIOVASCULAR A:  PEA > VF arrest Chronic LBBB H/O htn H/O hyperlipidemia Mild cardiomyopathy - recent LVEF 50% P:  MAP goal > 65 mmHg, levophed off. Cont statin. Holding amlodipine. Troponins negative Echo EF of 40% with diffuse hypokineses CTA chest poor quality but no evidence of PE, will check d-dimer, if negative then d/c heparin. Cards consult appreciated.  PULMONARY A: Acute resp failure post arrest P:   Full vent support - settings reviewed and  adjusted Vent bundle SBT in AM after completion of hypothermia  RENAL A:   Mild hypernatremia Mild hypokalemia P:   Monitor BMET intermittently Monitor I/Os KVO IVF Correct electrolytes as indicated  GASTROINTESTINAL A:   Obesity P:   SUP: IV famotidine TF per nutrition  HEMATOLOGIC A:   Mild anemia without overt blood loss P:  DVT px: full dose heparin, will d/c if d-dimer is negative and start SQ heparin. Monitor CBC intermittently Transfuse per usual ICU guidelines CTA poor quality but negative, DVT study prelim negative, will get d-dimer, if negative then will d/c heparin IV and start SQ heparin.  INFECTIOUS A:   No identified infections P:   Monitor temp, WBC count Micro and abx as above  ENDOCRINE A:   DM 2 Hypothyroidism  P:   SSI - resistant scale Holding oral DM meds Levothyroxine @ home dose of 75 mcg daily TSH 0.344  NEUROLOGIC A:   Post anoxic/ischemic encephalopathy P:   RASS goal: -1 Hypothermia protocol 36 degrees, to normothermia at 4:30 PM 3/14. Propofol PRN fent Neurology consult appreciated, EEG done, results pending.  FAMILY  - Updates: Family updated bedside.  The patient is critically ill with multiple organ systems failure and requires high complexity decision making for assessment and support, frequent evaluation and titration of therapies, application of advanced monitoring technologies and extensive interpretation of multiple databases.   Critical Care Time devoted to patient care services described in this note is  35  Minutes. This time reflects time of care of this signee Dr Jennet Maduro. This critical care time does not reflect procedure time, or teaching time or supervisory time of PA/NP/Med student/Med Resident etc but could involve care discussion time.  Rush Farmer, M.D. [facility] Pulmonary/Critical Care Medicine. Pager: [phone]. After hours pager: [phone].  06/08/2014, 9:31 AM

## 2014-06-08 NOTE — Progress Notes (Signed)
Subjective: No overnight events. Currently on hypothermia protocol. No abnormal movements noted.  Objective: Current vital signs: BP 133/70 mmHg  Pulse 58  Temp(Src) 96.4 F (35.8 C) (Core (Comment))  Resp 19  Ht 6' (1.829 m)  Wt 152.86 kg (336 lb 15.9 oz)  BMI 45.69 kg/m2  SpO2 100% Vital signs in last 24 hours: Temp:  [95 F (35 C)-97.9 F (36.6 C)] 96.4 F (35.8 C) (03/14 0800) Pulse Rate:  [55-94] 58 (03/14 0800) Resp:  [16-26] 19 (03/14 0800) BP: (84-139)/(42-96) 133/70 mmHg (03/14 0800) SpO2:  [92 %-100 %] 100 % (03/14 0800) Arterial Line BP: (115-160)/(60-84) 152/80 mmHg (03/14 0800) FiO2 (%):  [40 %-100 %] 40 % (03/14 0824) Weight:  [151.2 kg (333 lb 5.4 oz)-152.86 kg (336 lb 15.9 oz)] 152.86 kg (336 lb 15.9 oz) (03/14 0500)  Intake/Output from previous day: 03/13 0701 - 03/14 0700 In: 3441.5 [I.V.:3191.5; NG/GT:150; IV Piggyback:100] Out: 6295 [Urine:1650] Intake/Output this shift: Total I/O In: 158.5 [I.V.:158.5] Out: 350 [Urine:350] Nutritional status:    Neurologic Exam: (on sedation) Mental status: unresponsive. CN 2-12: pupils 2 mm, poorly reactive to light. EOM absent on Doll's maneuver. No nystagmus. Very weak corneal reflexes left>right. Face seems to be symmetric. Tongue: intubated. Motor: no spontaneous motor movements. Sensory: no response to painful stimuli  DTR's: unable to elicit.  Plantars: no tested. Coordination and gait: unable to test.   Lab Results: Basic Metabolic Panel:  Recent Labs Lab 06/07/14 1350 06/07/14 1357 06/08/14 0234  NA 142 146* 140  K 3.5 3.4* 3.9  CL 109 103 106  CO2 23  --  29  GLUCOSE 224* 227* 123*  BUN 22 25* 25*  CREATININE 1.14 1.10 1.20  CALCIUM 7.8*  --  8.2*  MG 1.7  --   --   PHOS 4.8*  --   --     Liver Function Tests:  Recent Labs Lab 06/07/14 1350  AST 450*  ALT 637*  ALKPHOS 98  BILITOT 0.4  PROT 5.7*  ALBUMIN 2.9*   No results for input(s): LIPASE, AMYLASE in the last 168  hours. No results for input(s): AMMONIA in the last 168 hours.  CBC:  Recent Labs Lab 06/07/14 1350 06/07/14 1357 06/08/14 0234  WBC 9.2  --  5.5  NEUTROABS 5.6  --   --   HGB 10.5* 11.6* 11.1*  HCT 32.9* 34.0* 34.1*  MCV 94.0  --  92.9  PLT 164  --  154    Cardiac Enzymes:  Recent Labs Lab 06/07/14 1350  TROPONINI <0.03    Lipid Panel: No results for input(s): CHOL, TRIG, HDL, CHOLHDL, VLDL, LDLCALC in the last 168 hours.  CBG:  Recent Labs Lab 06/07/14 1806 06/07/14 2100 06/08/14 0003 06/08/14 0448 06/08/14 0720  GLUCAP 126* 104* 102* 113* 44    Microbiology: Results for orders placed or performed during the hospital encounter of 06/07/14  MRSA PCR Screening     Status: None   Collection Time: 06/07/14  5:09 PM  Result Value Ref Range Status   MRSA by PCR NEGATIVE NEGATIVE Final    Comment:        The GeneXpert MRSA Assay (FDA approved for NASAL specimens only), is one component of a comprehensive MRSA colonization surveillance program. It is not intended to diagnose MRSA infection nor to guide or monitor treatment for MRSA infections.     Coagulation Studies:  Recent Labs  06/07/14 1350 06/07/14 1755 06/08/14 0234  LABPROT 15.0 15.6* 15.0  INR 1.17  1.23 1.16    Imaging: Ct Angio Chest Pe W/cm &/or Wo Cm  06/07/2014   CLINICAL DATA:  74 year old male with witnessed cardiac arrest this morning, status post CPR.  EXAM: CT ANGIOGRAPHY CHEST WITH CONTRAST  TECHNIQUE: Multidetector CT imaging of the chest was performed using the standard protocol during bolus administration of intravenous contrast. Multiplanar CT image reconstructions and MIPs were obtained to evaluate the vascular anatomy.  CONTRAST:  150mL OMNIPAQUE IOHEXOL 350 MG/ML SOLN  COMPARISON:  No priors.  FINDINGS: Study is exceedingly limited for evaluation of pulmonary embolism secondary to patient's large body habitus, inability to images a patient with the arms in the up position,  and suboptimal contrast bolus.  Mediastinum/Lymph Nodes: No central or lobar filling defect is noted to suggest large pulmonary embolism. Smaller segmental and subsegmental size filling defects cannot be excluded on today's examination secondary to the limitations discussed above. Pulmonic trunk is dilated measuring 4.1 cm in diameter, suggesting underlying pulmonary arterial hypertension. Cardiomegaly with left ventricular dilatation. There is atherosclerosis of the thoracic aorta, the great vessels of the mediastinum and the coronary arteries, including calcified atherosclerotic plaque in the left main, left anterior descending and right coronary arteries. There is no significant pericardial fluid, thickening or pericardial calcification. No pathologically enlarged mediastinal or hilar lymph nodes. Esophagus is unremarkable in appearance. No axillary lymphadenopathy. Nasogastric tube extends at least to the stomach (it extends below the lower margin of the images). Endotracheal tube in position with tip terminating approximately 2.4 cm above the carina.  Lungs/Pleura: Dependent subsegmental atelectasis in the lower lobes of the lungs bilaterally (right greater than left). Small amount of dependent airspace consolidation in the posterior aspect of the right upper lobe and in the inferior right lower lobe which may suggest mild aspiration given the patient's history. No pneumothorax. No pleural effusions.  Upper Abdomen: Small calcified gallstone in the gallbladder. Diffuse low attenuation of the hepatic parenchyma, indicative of hepatic steatosis.  Musculoskeletal/Soft Tissues: No acute displaced fractures or aggressive appearing lytic or blastic lesions are noted in the visualized portions of the skeleton.  Review of the MIP images confirms the above findings.  IMPRESSION: 1. Exceedingly limited examination demonstrating no central or lobar sized pulmonary embolism. Segmental and subsegmental sized emboli cannot  be excluded. 2. Dependent airspace consolidation in the posterior right upper lobe and dependent portion of the right lower lobe, likely sequela of mild aspiration. 3. Cardiomegaly with left ventricular dilatation. 4. Dilatation of the pulmonic trunk (4.1 cm in diameter), suggestive of pulmonary arterial hypertension. 5. Atherosclerosis, including left main and 2 vessel coronary artery disease. Assessment for potential risk factor modification, dietary therapy or pharmacologic therapy may be warranted, if clinically indicated. 6. Mild dependent subsegmental atelectasis in the lower lobes of the lungs bilaterally. 7. Hepatic steatosis. 8. Cholelithiasis. 9. Support apparatus, as above.   Electronically Signed   By: Vinnie Langton M.D.   On: 06/07/2014 16:33   Dg Chest Port 1 View  06/07/2014   CLINICAL DATA:  Central line placement.  EXAM: PORTABLE CHEST - 1 VIEW  COMPARISON:  01/20/2014  FINDINGS: Endotracheal tube has tip 3.8 cm above the carina. Nasogastric tube courses into the region of the stomach and off the inferior portion of the film. Left subclavian central venous catheter is present with tip in the region of the SVC at the level of the carina. No evidence of pneumothorax.  Lungs are moderately hypoinflated with stable elevation of the right hemidiaphragm. Mild stable cardiomegaly. Remainder the  exam is unchanged.  IMPRESSION: Hypoinflation without evidence of acute disease. Mild stable cardiomegaly.  Tubes and lines as described.   Electronically Signed   By: Marin Olp M.D.   On: 06/07/2014 17:30    Medications:  Scheduled: . antiseptic oral rinse  7 mL Mouth Rinse QID  . aspirin  325 mg Per Tube Daily  . chlorhexidine  15 mL Mouth Rinse BID  . famotidine (PEPCID) IV  20 mg Intravenous Q12H  . insulin aspart  0-20 Units Subcutaneous 6 times per day  . levothyroxine  75 mcg Per Tube Daily  . rosuvastatin  10 mg Per Tube q1800    Assessment/Plan:  74 y/o with severe post cardiac  arrest anoxic ischemic encephalopathy, currently undergoing hypothermia protocol. No paroxysmal movements noted at this time.  -EEG pending. -Will follow up.   LOS: 1 day   Jim Like, DO [facility] [phone]  If 7pm- 7am, please page neurology on call as listed in [place]. 06/08/2014  8:42 AM

## 2014-06-08 NOTE — Progress Notes (Addendum)
ANTICOAGULATION CONSULT NOTE - Follow Up Consult  Pharmacy Consult for heparin  Indication: r/o ACS and PE  Allergies  Allergen Reactions  . Bee Venom Swelling    Swelling on lips and tongue  . Codeine   . Demerol [Meperidine]   . Pioglitazone     Lips swelling  . Shrimp [Shellfish Allergy]     rash  . Sulfa Antibiotics     Patient Measurements: Height: 6' (182.9 cm) Weight: (!) 336 lb 15.9 oz (152.86 kg) IBW/kg (Calculated) : 77.6 Heparin Dosing Weight: 109kg  Vital Signs: Temp: 95 F (35 C) (03/14 0700) Temp Source: Core (Comment) (03/14 0700) BP: 107/57 mmHg (03/14 0700) Pulse Rate: 62 (03/14 0730)  Labs:  Recent Labs  06/07/14 1350 06/07/14 1357 06/07/14 1755 06/07/14 2200 06/08/14 0234  HGB 10.5* 11.6*  --   --  11.1*  HCT 32.9* 34.0*  --   --  34.1*  PLT 164  --   --   --  154  APTT 31  --  64*  --  100*  LABPROT 15.0  --  15.6*  --  15.0  INR 1.17  --  1.23  --  1.16  HEPARINUNFRC  --   --   --  0.49 0.51  CREATININE 1.14 1.10  --   --  1.20  TROPONINI <0.03  --   --   --   --     Estimated Creatinine Clearance: 83.5 mL/min (by C-G formula based on Cr of 1.2).   Medications:  Scheduled:  . antiseptic oral rinse  7 mL Mouth Rinse QID  . aspirin  325 mg Per Tube Daily  . chlorhexidine  15 mL Mouth Rinse BID  . famotidine (PEPCID) IV  20 mg Intravenous Q12H  . insulin aspart  0-20 Units Subcutaneous 6 times per day  . levothyroxine  75 mcg Per Tube Daily  . rosuvastatin  10 mg Per Tube q1800   Infusions:  . heparin 1,300 Units/hr (06/08/14 0455)  . norepinephrine (LEVOPHED) Adult infusion Stopped (06/07/14 2035)  . propofol 50 mcg/kg/min (06/08/14 0639)  . sodium chloride 0.45 % with kcl 100 mL/hr at 06/08/14 0444    Assessment: 74 yo male s/p cardiac arrest on hypothermia protocol. He is on heparin for r/o ACS and r/o PE. The CT study was limited. Heparin is at 1300 units/hr and the heparin level is noted at goal (HL= 0.51). He is to begin  rewarming at about 4:30pm today.  Goal of Therapy:  Heparin level 0.3-0.7 units/ml Monitor platelets by anticoagulation protocol: Yes   Plan:  -No heparin changes needed -Heparin level 6 hours after rewarming begins -Daily heparin level and CBC  Hildred Laser, Pharm D 06/08/2014 8:15 AM

## 2014-06-08 NOTE — Progress Notes (Addendum)
Patient Name: Rickey Rice Date of Encounter: 06/08/2014  Active Problems:   PEA (Pulseless electrical activity)   Anoxic encephalopathy   Cardiac arrest   Acute respiratory failure with hypoxemia   Length of Stay: 1  SUBJECTIVE  The patient is sedated and paralyzed.  CURRENT MEDS . antiseptic oral rinse  7 mL Mouth Rinse QID  . aspirin  325 mg Per Tube Daily  . chlorhexidine  15 mL Mouth Rinse BID  . famotidine (PEPCID) IV  20 mg Intravenous Q12H  . furosemide  40 mg Intravenous Q6H  . insulin aspart  0-20 Units Subcutaneous 6 times per day  . levothyroxine  75 mcg Per Tube Daily  . magnesium sulfate 1 - 4 g bolus IVPB  2 g Intravenous Once  . potassium chloride  40 mEq Oral TID  . rosuvastatin  10 mg Per Tube q1800    OBJECTIVE  Filed Vitals:   06/08/14 0700 06/08/14 0730 06/08/14 0800 06/08/14 0900  BP: 107/57  133/70 129/78  Pulse: 58 62 58 61  Temp: 95 F (35 C)  96.4 F (35.8 C) 97.7 F (36.5 C)  TempSrc: Core (Comment)  Core (Comment) Core (Comment)  Resp: 16 16 19 24   Height:      Weight:      SpO2: 100% 100% 100% 100%    Intake/Output Summary (Last 24 hours) at 06/08/14 1000 Last data filed at 06/08/14 0900  Gross per 24 hour  Intake 3735.73 ml  Output   2000 ml  Net 1735.73 ml   Filed Weights   06/07/14 1444 06/07/14 1630 06/08/14 0500  Weight: 335 lb (151.955 kg) 333 lb 5.4 oz (151.2 kg) 336 lb 15.9 oz (152.86 kg)    PHYSICAL EXAM  General: Sedated, paralyzed.  Neuro: Alert and oriented X 3. Moves all extremities spontaneously. Psych: Normal affect. HEENT:  Normal  Neck: Supple without bruits or JVD. Lungs:  Resp regular and unlabored, CTA. Heart: RRR no s3, s4, or murmurs. Abdomen: Soft, non-tender, non-distended, BS + x 4.  Extremities: No clubbing, cyanosis or edema. DP/PT/Radials 2+ and equal bilaterally.  Accessory Clinical Findings  CBC  Recent Labs  06/07/14 1350 06/07/14 1357 06/08/14 0234  WBC 9.2  --  5.5    NEUTROABS 5.6  --   --   HGB 10.5* 11.6* 11.1*  HCT 32.9* 34.0* 34.1*  MCV 94.0  --  92.9  PLT 164  --  665   Basic Metabolic Panel  Recent Labs  06/07/14 1350 06/07/14 1357 06/08/14 0234  NA 142 146* 140  K 3.5 3.4* 3.9  CL 109 103 106  CO2 23  --  29  GLUCOSE 224* 227* 123*  BUN 22 25* 25*  CREATININE 1.14 1.10 1.20  CALCIUM 7.8*  --  8.2*  MG 1.7  --   --   PHOS 4.8*  --   --    Liver Function Tests  Recent Labs  06/07/14 1350  AST 450*  ALT 637*  ALKPHOS 98  BILITOT 0.4  PROT 5.7*  ALBUMIN 2.9*   Cardiac Enzymes  Recent Labs  06/07/14 1350  TROPONINI <0.03    Recent Labs  06/08/14 0235  TSH 0.344*   Radiology/Studies  Ct Angio Chest Pe W/cm &/or Wo Cm  06/07/2014   CLINICAL DATA:  74 year old male with witnessed cardiac arrest this morning, status post CPR.   IMPRESSION: 1. Exceedingly limited examination demonstrating no central or lobar sized pulmonary embolism. Segmental and subsegmental sized  emboli cannot be excluded. 2. Dependent airspace consolidation in the posterior right upper lobe and dependent portion of the right lower lobe, likely sequela of mild aspiration. 3. Cardiomegaly with left ventricular dilatation. 4. Dilatation of the pulmonic trunk (4.1 cm in diameter), suggestive of pulmonary arterial hypertension. 5. Atherosclerosis, including left main and 2 vessel coronary artery disease. Assessment for potential risk factor modification, dietary therapy or pharmacologic therapy may be warranted, if clinically indicated. 6. Mild dependent subsegmental atelectasis in the lower lobes of the lungs bilaterally. 7. Hepatic steatosis. 8. Cholelithiasis. 9. Support apparatus, as above.   Electronically Signed   By: Vinnie Langton M.D.   On: 06/07/2014 16:33   Dg Chest Port 1 View  06/07/2014   CLINICAL DATA:  Central line placement.    IMPRESSION: Hypoinflation without evidence of acute disease. Mild stable cardiomegaly.  Tubes and lines as  described.   Electronically Signed   By: Marin Olp M.D.   On: 06/07/2014 17:30   Echocardiogram: 06/07/2014 Left ventricle: Diffuse hypokinesis with abnormal septal motion from LBBB. The cavity size was moderately dilated. Wall thickness was normal. The estimated ejection fraction was 40%. - Aortic valve: There was trivial regurgitation. - Aorta: Some shadowing artifact normal diameter - Atrial septum: No defect or patent foramen ovale was identified.  TELE: SR, frequent PVCs  ECG: SR, LBBB, LAD    ASSESSMENT AND PLAN  74 yo with no documented CAD. Chronic LBBB 05/02/12 had normal myovue pre knee surgery with EF 49%. S/P bilateral TKR;s CRF Morbid obesity, elevated lipids and HTN. [place] at church--> PEA--> V fib x 3 shocks. He is undergoing cooling protocol that will be completed at 4 pm today, then he will be rewarmed. His troponin was negative x 2, ECG is unchanged from 2015. No AVB on ECG.  Echo shows diffuse hypokinesis with LVEF 40%. We will start BB/ACE possibly tomorrow. We will consider cath if the mental status improves.   He is hemodynamically stable, off Levophed since the last night.  CCM is ordering D-dimer for PE suspicion.   No significant fluid overload on physical exam.    Signed, Dorothy Spark MD, [facility] 06/08/2014

## 2014-06-09 ENCOUNTER — Inpatient Hospital Stay (HOSPITAL_COMMUNITY): Payer: Medicare Other

## 2014-06-09 DIAGNOSIS — I420 Dilated cardiomyopathy: Secondary | ICD-10-CM

## 2014-06-09 DIAGNOSIS — I10 Essential (primary) hypertension: Secondary | ICD-10-CM | POA: Diagnosis present

## 2014-06-09 LAB — BLOOD GAS, ARTERIAL
Acid-Base Excess: 2.1 mmol/L — ABNORMAL HIGH (ref 0.0–2.0)
BICARBONATE: 27.1 meq/L — AB (ref 20.0–24.0)
DRAWN BY: 36277
FIO2: 0.4 %
O2 Saturation: 99.2 %
PATIENT TEMPERATURE: 96.8
PEEP/CPAP: 5 cmH2O
PH ART: 7.366 (ref 7.350–7.450)
RATE: 16 resp/min
TCO2: 28.7 mmol/L (ref 0–100)
VT: 500 mL
pCO2 arterial: 47.8 mmHg — ABNORMAL HIGH (ref 35.0–45.0)
pO2, Arterial: 159 mmHg — ABNORMAL HIGH (ref 80.0–100.0)

## 2014-06-09 LAB — CBC
HEMATOCRIT: 38.2 % — AB (ref 39.0–52.0)
HEMOGLOBIN: 12.5 g/dL — AB (ref 13.0–17.0)
MCH: 31.2 pg (ref 26.0–34.0)
MCHC: 32.7 g/dL (ref 30.0–36.0)
MCV: 95.3 fL (ref 78.0–100.0)
Platelets: 180 10*3/uL (ref 150–400)
RBC: 4.01 MIL/uL — ABNORMAL LOW (ref 4.22–5.81)
RDW: 14.1 % (ref 11.5–15.5)
WBC: 7.4 10*3/uL (ref 4.0–10.5)

## 2014-06-09 LAB — GLUCOSE, CAPILLARY
GLUCOSE-CAPILLARY: 101 mg/dL — AB (ref 70–99)
GLUCOSE-CAPILLARY: 116 mg/dL — AB (ref 70–99)
GLUCOSE-CAPILLARY: 92 mg/dL (ref 70–99)
Glucose-Capillary: 103 mg/dL — ABNORMAL HIGH (ref 70–99)
Glucose-Capillary: 94 mg/dL (ref 70–99)
Glucose-Capillary: 103 mg/dL — ABNORMAL HIGH (ref 70–99)

## 2014-06-09 LAB — PHOSPHORUS: Phosphorus: 4.8 mg/dL — ABNORMAL HIGH (ref 2.3–4.6)

## 2014-06-09 LAB — BASIC METABOLIC PANEL
ANION GAP: 12 (ref 5–15)
BUN: 33 mg/dL — ABNORMAL HIGH (ref 6–23)
CALCIUM: 8.7 mg/dL (ref 8.4–10.5)
CO2: 28 mmol/L (ref 19–32)
CREATININE: 1.21 mg/dL (ref 0.50–1.35)
Chloride: 105 mmol/L (ref 96–112)
GFR, EST AFRICAN AMERICAN: 67 mL/min — AB (ref >90)
GFR, EST NON AFRICAN AMERICAN: 58 mL/min — AB (ref >90)
Glucose, Bld: 113 mg/dL — ABNORMAL HIGH (ref 70–99)
Potassium: 4.3 mmol/L (ref 3.5–5.1)
SODIUM: 145 mmol/L (ref 135–145)

## 2014-06-09 LAB — HEPARIN LEVEL (UNFRACTIONATED)
HEPARIN UNFRACTIONATED: 0.74 [IU]/mL — AB (ref 0.30–0.70)
Heparin Unfractionated: 0.85 IU/mL — ABNORMAL HIGH (ref 0.30–0.70)
Heparin Unfractionated: 0.32 IU/mL (ref 0.30–0.70)

## 2014-06-09 LAB — MAGNESIUM: Magnesium: 2.1 mg/dL (ref 1.5–2.5)

## 2014-06-09 MED ORDER — CETYLPYRIDINIUM CHLORIDE 0.05 % MT LIQD
7.0000 mL | Freq: Two times a day (BID) | OROMUCOSAL | Status: DC
  Administered 2014-06-09 – 2014-06-19 (×19): 7 mL via OROMUCOSAL

## 2014-06-09 MED ORDER — FUROSEMIDE 10 MG/ML IJ SOLN
40.0000 mg | Freq: Three times a day (TID) | INTRAMUSCULAR | Status: AC
  Administered 2014-06-09 (×2): 40 mg via INTRAVENOUS
  Filled 2014-06-09 (×2): qty 4

## 2014-06-09 MED ORDER — AMLODIPINE 1 MG/ML ORAL SUSPENSION: 10.0000 mg | Freq: Every day | ORAL | Status: DC

## 2014-06-09 MED ORDER — AMLODIPINE BESYLATE 10 MG PO TABS
10.0000 mg | ORAL_TABLET | Freq: Every day | ORAL | Status: DC
  Administered 2014-06-09 – 2014-06-15 (×7): 10 mg via ORAL
  Filled 2014-06-09 (×7): qty 1

## 2014-06-09 NOTE — Progress Notes (Signed)
ANTICOAGULATION CONSULT NOTE - Follow Up Consult  Pharmacy Consult for heparin  Indication: r/o ACS and PE  Allergies  Allergen Reactions  . Bee Venom Swelling    Swelling on lips and tongue  . Codeine   . Demerol [Meperidine]   . Pioglitazone     Lips swelling  . Shrimp [Shellfish Allergy]     rash  . Sulfa Antibiotics     Patient Measurements: Height: 6' (182.9 cm) Weight: (!) 334 lb 2.1 oz (151.56 kg) IBW/kg (Calculated) : 77.6 Heparin Dosing Weight: 109kg  Vital Signs: Temp: 97.7 F (36.5 C) (03/15 0900) Temp Source: Core (Comment) (03/15 0900) BP: 119/72 mmHg (03/15 0900) Pulse Rate: 70 (03/15 0900)  Labs:  Recent Labs  06/07/14 1350 06/07/14 1357 06/07/14 1755  06/08/14 0234 06/08/14 2230 06/09/14 0535 06/09/14 0750  HGB 10.5* 11.6*  --   --  11.1*  --  12.5*  --   HCT 32.9* 34.0*  --   --  34.1*  --  38.2*  --   PLT 164  --   --   --  154  --  180  --   APTT 31  --  64*  --  100*  --   --   --   LABPROT 15.0  --  15.6*  --  15.0  --   --   --   INR 1.17  --  1.23  --  1.16  --   --   --   HEPARINUNFRC  --   --   --   < > 0.51 0.74*  --  0.85*  CREATININE 1.14 1.10  --   --  1.20  --  1.21  --   TROPONINI <0.03  --   --   --   --   --   --   --   < > = values in this interval not displayed.  Estimated Creatinine Clearance: 82.4 mL/min (by C-G formula based on Cr of 1.21).   Medications:  Scheduled:  . antiseptic oral rinse  7 mL Mouth Rinse QID  . aspirin  325 mg Per Tube Daily  . chlorhexidine  15 mL Mouth Rinse BID  . famotidine (PEPCID) IV  20 mg Intravenous Q12H  . feeding supplement (PRO-STAT SUGAR FREE 64)  60 mL Per Tube QID  . feeding supplement (VITAL HIGH PROTEIN)  1,000 mL Per Tube Q24H  . insulin aspart  0-20 Units Subcutaneous 6 times per day  . levothyroxine  75 mcg Per Tube Daily  . rosuvastatin  10 mg Per Tube q1800   Infusions:  . sodium chloride 10 mL/hr at 06/08/14 0800  . heparin 1,200 Units/hr (06/09/14 0102)  .  norepinephrine (LEVOPHED) Adult infusion Stopped (06/07/14 2035)  . propofol 25 mcg/kg/min (06/09/14 0745)    Assessment: 74 yo male s/p cardiac arrest s/p hypothermia protocol and now rewarmed. He is on heparin for r/o ACS and r/o PE. The CT study was limited and d-dimer= 5.21 on 3/14.  Heparin is at 1200 units/hr and the heparin level is now noted above goal (HL= 0.85).  Goal of Therapy:  Heparin level 0.3-0.7 units/ml Monitor platelets by anticoagulation protocol: Yes   Plan:  -Decrease heparin to 1000 units/hr -Heparin level in 8hrs -Daily heparin level and CBC  Hildred Laser, Pharm D 06/09/2014 9:18 AM

## 2014-06-09 NOTE — Progress Notes (Signed)
Patient Name: Rickey Rice Date of Encounter: 06/09/2014  Active Problems:   PEA (Pulseless electrical activity)   Anoxic encephalopathy   Cardiac arrest   Acute respiratory failure with hypoxemia   Congestive dilated cardiomyopathy   Length of Stay: 2  SUBJECTIVE  The patient just extubated. He answers simple questions. Follows commands.   CURRENT MEDS . amLODipine  10 mg Oral Daily  . antiseptic oral rinse  7 mL Mouth Rinse QID  . aspirin  325 mg Per Tube Daily  . chlorhexidine  15 mL Mouth Rinse BID  . famotidine (PEPCID) IV  20 mg Intravenous Q12H  . feeding supplement (PRO-STAT SUGAR FREE 64)  60 mL Per Tube QID  . feeding supplement (VITAL HIGH PROTEIN)  1,000 mL Per Tube Q24H  . furosemide  40 mg Intravenous Q8H  . insulin aspart  0-20 Units Subcutaneous 6 times per day  . levothyroxine  75 mcg Per Tube Daily  . rosuvastatin  10 mg Per Tube q1800    OBJECTIVE  Filed Vitals:   06/09/14 0756 06/09/14 0800 06/09/14 0900 06/09/14 1000  BP: 117/59 95/63 119/72 94/47  Pulse: 71 70 70 73  Temp:  98.4 F (36.9 C) 97.7 F (36.5 C)   TempSrc:  Core (Comment) Core (Comment)   Resp:  16 17 17   Height:      Weight:      SpO2:  98% 100% 100%    Intake/Output Summary (Last 24 hours) at 06/09/14 1049 Last data filed at 06/09/14 1000  Gross per 24 hour  Intake 2289.95 ml  Output   7125 ml  Net -4835.05 ml   Filed Weights   06/07/14 1630 06/08/14 0500 06/09/14 0500  Weight: 333 lb 5.4 oz (151.2 kg) 336 lb 15.9 oz (152.86 kg) 334 lb 2.1 oz (151.56 kg)    PHYSICAL EXAM  General: Extubated Neuro: Alert and oriented X 2. Moves all extremities spontaneously. Psych: Normal affect. HEENT:  Normal  Neck: Supple without bruits or JVD. Lungs:  Resp regular and unlabored, rales B/L. Heart: RRR no s3, s4, or murmurs. Abdomen: Soft, non-tender, non-distended, BS + x 4.  Extremities: No clubbing, cyanosis , non-pitting edema B/L. DP/PT/Radials 2+ and equal  bilaterally.  Accessory Clinical Findings  CBC  Recent Labs  06/07/14 1350  06/08/14 0234 06/09/14 0535  WBC 9.2  --  5.5 7.4  NEUTROABS 5.6  --   --   --   HGB 10.5*  < > 11.1* 12.5*  HCT 32.9*  < > 34.1* 38.2*  MCV 94.0  --  92.9 95.3  PLT 164  --  154 180  < > = values in this interval not displayed. Basic Metabolic Panel  Recent Labs  06/07/14 1350  06/08/14 0234 06/09/14 0535  NA 142  < > 140 145  K 3.5  < > 3.9 4.3  CL 109  < > 106 105  CO2 23  --  29 28  GLUCOSE 224*  < > 123* 113*  BUN 22  < > 25* 33*  CREATININE 1.14  < > 1.20 1.21  CALCIUM 7.8*  --  8.2* 8.7  MG 1.7  --   --  2.1  PHOS 4.8*  --   --  4.8*  < > = values in this interval not displayed. Liver Function Tests  Recent Labs  06/07/14 1350  AST 450*  ALT 637*  ALKPHOS 98  BILITOT 0.4  PROT 5.7*  ALBUMIN 2.9*  Cardiac Enzymes  Recent Labs  06/07/14 1350  TROPONINI <0.03    Recent Labs  06/08/14 0235  TSH 0.344*   Radiology/Studies  Ct Angio Chest Pe W/cm &/or Wo Cm  06/07/2014   CLINICAL DATA:  74 year old male with witnessed cardiac arrest this morning, status post CPR.   IMPRESSION: 1. Exceedingly limited examination demonstrating no central or lobar sized pulmonary embolism. Segmental and subsegmental sized emboli cannot be excluded. 2. Dependent airspace consolidation in the posterior right upper lobe and dependent portion of the right lower lobe, likely sequela of mild aspiration. 3. Cardiomegaly with left ventricular dilatation. 4. Dilatation of the pulmonic trunk (4.1 cm in diameter), suggestive of pulmonary arterial hypertension. 5. Atherosclerosis, including left main and 2 vessel coronary artery disease. Assessment for potential risk factor modification, dietary therapy or pharmacologic therapy may be warranted, if clinically indicated. 6. Mild dependent subsegmental atelectasis in the lower lobes of the lungs bilaterally. 7. Hepatic steatosis. 8. Cholelithiasis. 9. Support  apparatus, as above.   Electronically Signed   By: Vinnie Langton M.D.   On: 06/07/2014 16:33   Dg Chest Port 1 View  06/07/2014   CLINICAL DATA:  Central line placement.    IMPRESSION: Hypoinflation without evidence of acute disease. Mild stable cardiomegaly.  Tubes and lines as described.   Electronically Signed   By: Marin Olp M.D.   On: 06/07/2014 17:30   Echocardiogram: 06/07/2014 Left ventricle: Diffuse hypokinesis with abnormal septal motion from LBBB. The cavity size was moderately dilated. Wall thickness was normal. The estimated ejection fraction was 40%. - Aortic valve: There was trivial regurgitation. - Aorta: Some shadowing artifact normal diameter - Atrial septum: No defect or patent foramen ovale was identified.  TELE: SR, frequent PVCs  ECG: SR, LBBB, LAD    ASSESSMENT AND PLAN  74 yo with no documented CAD. Chronic LBBB 05/02/12 had normal myovue pre knee surgery in October 2015 with LVEF 49%. S/P bilateral TKR;s CRF Morbid obesity, elevated lipids and HTN. [place] [employer]--> PEA--> V fib x 3 shocks. He underwent  cooling protocol, rewarmed overnight, extubated this am, off levophed and started on amlodipine 10 mg po daily by CCM.   He had elevated D-dimer, poor quality chest CT with low suspicion for pulmonary embolism. V/Q scan is scheduled.  His troponin was negative x 2, ECG is unchanged from 2015. No AVB on ECG.  Echo shows diffuse hypokinesis with LVEF 40%.  If PE ruled out, we will plan for a cardiac cath most probably on Thursday.  He is fluid overloaded, he diuresed well overnight with iv Lasix, I would continue lasix 40 mg po Q8H, if Crea worsens by tomorrow am, I would hold for the cath.  He will require ACEI or BiDil after the cath for low EF.    Signed, Dorothy Spark MD, [facility] 06/09/2014

## 2014-06-09 NOTE — Progress Notes (Deleted)
Ambulated pt approx. 245ft. Pt tolerated well. All vitals remained stable. Pt. Did not complain of any chest pain.

## 2014-06-09 NOTE — Progress Notes (Signed)
ANTICOAGULATION CONSULT NOTE - Follow Up Consult  Pharmacy Consult for heparin Indication: r/o ACS/PE s/p cardiac arrest   Labs:  Recent Labs  06/07/14 1350 06/07/14 1357 06/07/14 1755 06/07/14 2200 06/08/14 0234 06/08/14 2230  HGB 10.5* 11.6*  --   --  11.1*  --   HCT 32.9* 34.0*  --   --  34.1*  --   PLT 164  --   --   --  154  --   APTT 31  --  64*  --  100*  --   LABPROT 15.0  --  15.6*  --  15.0  --   INR 1.17  --  1.23  --  1.16  --   HEPARINUNFRC  --   --   --  0.49 0.51 0.74*  CREATININE 1.14 1.10  --   --  1.20  --   TROPONINI <0.03  --   --   --   --   --      Assessment: 74yo male now slightly supratherapeutic on heparin after two levels at goal; now fully rewarmed, would expect heparin requirement to go up, drawn appropriately, may be accumulating.  Goal of Therapy:  Heparin level 0.3-0.7 units/ml   Plan:  Will decrease heparin gtt slightly to 1200 units/hr and check level in 6-8hr.  Wynona Neat, PharmD, BCPS  06/09/2014,12:55 AM

## 2014-06-09 NOTE — Progress Notes (Signed)
PULMONARY / CRITICAL CARE MEDICINE   Name: Rickey Rice MRN: [id number] DOB: 22-Oct-1940    ADMISSION DATE:  06/07/2014  INITIAL PRESENTATION:  32 M with obesity, prior bilateral knee surgeries, chronic LBBB, mild cardiomyopathy suffered cardiac arrest while attending church. Initial rhythm was shockable via AED then PEA. Subsequently VF. ROSC in approx 15 mins. Was purposeful in ED but required intubation after he removed [facility] airway. Code cool called and PCCM admitted  MAJOR EVENTS/TEST RESULTS: 3/13 admitted as above  INDWELLING DEVICES:: ETT 3/13 >>  L [place] CVl 3/13 >>  Radial srt line (ordered) 3/13 >>   MICRO DATA: Blood 3/13 >>   ANTIMICROBIALS:   SUBJECTIVE: No events overnight.  VITAL SIGNS: Temp:  [95.4 F (35.2 C)-99.5 F (37.5 C)] 97.7 F (36.5 C) (03/15 0900) Pulse Rate:  [63-79] 70 (03/15 0900) Resp:  [16-32] 17 (03/15 0900) BP: (84-140)/(51-90) 119/72 mmHg (03/15 0900) SpO2:  [98 %-100 %] 100 % (03/15 0900) Arterial Line BP: (102-159)/(55-84) 159/75 mmHg (03/15 0900) FiO2 (%):  [40 %] 40 % (03/15 0800) Weight:  [151.56 kg (334 lb 2.1 oz)] 151.56 kg (334 lb 2.1 oz) (03/15 0500)  HEMODYNAMICS: CVP:  [11 mmHg] 11 mmHg  VENTILATOR SETTINGS: Vent Mode:  [-] PRVC FiO2 (%):  [40 %] 40 % Set Rate:  [16 bmp] 16 bmp Vt Set:  [500 mL] 500 mL PEEP:  [5 cmH20] 5 cmH20 Plateau Pressure:  [9 cmH20-25 cmH20] 16 cmH20  INTAKE / OUTPUT:  Intake/Output Summary (Last 24 hours) at 06/09/14 0958 Last data filed at 06/09/14 0930  Gross per 24 hour  Intake 2430.25 ml  Output   7300 ml  Net -4869.75 ml   PHYSICAL EXAMINATION: General: obese, intubated, following commands in all ext with wake up assessment. Neuro:  RASS -2, not F/C, MAEs, CNs intact HEENT: NCAT, WNL Cardiovascular: regular, distant HS, no M noted Lungs: Coarse BS diffusely. Abdomen:  Obese, soft, NT, + BS Ext: B well healed knee surgical scars, L>R edema  LABS:  CBC  Recent Labs Lab  06/07/14 1350 06/07/14 1357 06/08/14 0234 06/09/14 0535  WBC 9.2  --  5.5 7.4  HGB 10.5* 11.6* 11.1* 12.5*  HCT 32.9* 34.0* 34.1* 38.2*  PLT 164  --  154 180   Coag's  Recent Labs Lab 06/07/14 1350 06/07/14 1755 06/08/14 0234  APTT 31 64* 100*  INR 1.17 1.23 1.16   BMET  Recent Labs Lab 06/07/14 1350 06/07/14 1357 06/08/14 0234 06/09/14 0535  NA 142 146* 140 145  K 3.5 3.4* 3.9 4.3  CL 109 103 106 105  CO2 23  --  29 28  BUN 22 25* 25* 33*  CREATININE 1.14 1.10 1.20 1.21  GLUCOSE 224* 227* 123* 113*   Electrolytes  Recent Labs Lab 06/07/14 1350 06/08/14 0234 06/09/14 0535  CALCIUM 7.8* 8.2* 8.7  MG 1.7  --  2.1  PHOS 4.8*  --  4.8*   Sepsis Markers  Recent Labs Lab 06/07/14 1357  LATICACIDVEN 3.24*   ABG  Recent Labs Lab 06/07/14 1510 06/09/14 0325  PHART 7.284* 7.366  PCO2ART 55.4* 47.8*  PO2ART 263.0* 159.0*   Liver Enzymes  Recent Labs Lab 06/07/14 1350  AST 450*  ALT 637*  ALKPHOS 98  BILITOT 0.4  ALBUMIN 2.9*   Cardiac Enzymes  Recent Labs Lab 06/07/14 1350  TROPONINI <0.03   Glucose  Recent Labs Lab 06/08/14 1149 06/08/14 1604 06/08/14 2059 06/09/14 0029 06/09/14 0507 06/09/14 0728  GLUCAP 107* 96 117*  116* 103* 94    Imaging No results found. ASSESSMENT / PLAN:  CARDIOVASCULAR A:  PEA > VF arrest Chronic LBBB H/O htn H/O hyperlipidemia Mild cardiomyopathy - recent LVEF 50% P:  MAP goal > 65 mmHg, levophed off. Cont statin. Restart amlodipine 10 mg PO daily. Troponins negative Echo EF of 40% with diffuse hypokineses CTA chest poor quality but no evidence of PE, D-dimer elevated, DVT negative, will order a V/Q scan, if negative then will d/c heparin. Cards consult appreciated.  PULMONARY A: Acute resp failure post arrest P:   Extubate today. Titrate O2 for saturation of 88-92%. Swallow evaluation.  RENAL A:   Mild hypernatremia Mild hypokalemia P:   Monitor BMET intermittently Monitor  I/Os KVO IVF. Correct electrolytes as indicated. Will give 2 additional doses of lasix 40 mg.  GASTROINTESTINAL A:   Obesity P:   SUP: IV famotidine D/C TF. Swallow evaluation.   HEMATOLOGIC A:   Mild anemia without overt blood loss P:  DVT px: full dose heparin, will d/c if d-dimer is negative and start SQ heparin. Monitor CBC intermittently Transfuse per usual ICU guidelines V/Q scan, if negative then will d/c heparin.  INFECTIOUS A:   No identified infections P:   Monitor temp, WBC count. Micro and abx as above.  ENDOCRINE A:   DM 2 Hypothyroidism  P:   SSI - resistant scale Holding oral DM meds Levothyroxine @ home dose of 75 mcg daily TSH 0.344  NEUROLOGIC A:   Post anoxic/ischemic encephalopathy P:   D/C sedation. Mobilize. No need for repeat CT at this point.   FAMILY  - Updates: Family updated bedside.  The patient is critically ill with multiple organ systems failure and requires high complexity decision making for assessment and support, frequent evaluation and titration of therapies, application of advanced monitoring technologies and extensive interpretation of multiple databases.   Critical Care Time devoted to patient care services described in this note is  35  Minutes. This time reflects time of care of this signee Dr Jennet Maduro. This critical care time does not reflect procedure time, or teaching time or supervisory time of PA/NP/Med student/Med Resident etc but could involve care discussion time.  Rush Farmer, M.D. [facility] Pulmonary/Critical Care Medicine. Pager: [phone]. After hours pager: [phone].  06/09/2014, 9:58 AM

## 2014-06-09 NOTE — Progress Notes (Signed)
Subjective: No overnight events. EEG completed shows generalized irregular slowing consistent with a cerebral dysfunction.   Objective: Current vital signs: BP 119/72 mmHg  Pulse 70  Temp(Src) 97.7 F (36.5 C) (Core (Comment))  Resp 17  Ht 6' (1.829 m)  Wt 151.56 kg (334 lb 2.1 oz)  BMI 45.31 kg/m2  SpO2 100% Vital signs in last 24 hours: Temp:  [95.4 F (35.2 C)-99.5 F (37.5 C)] 97.7 F (36.5 C) (03/15 0900) Pulse Rate:  [63-79] 70 (03/15 0900) Resp:  [16-32] 17 (03/15 0900) BP: (84-140)/(51-90) 119/72 mmHg (03/15 0900) SpO2:  [98 %-100 %] 100 % (03/15 0900) Arterial Line BP: (102-159)/(55-84) 159/75 mmHg (03/15 0900) FiO2 (%):  [40 %] 40 % (03/15 0800) Weight:  [151.56 kg (334 lb 2.1 oz)] 151.56 kg (334 lb 2.1 oz) (03/15 0500)  Intake/Output from previous day: 03/14 0701 - 03/15 0700 In: 2361.4 [I.V.:1641.4; NG/GT:570; IV Piggyback:150] Out: 7525 [Urine:7525] Intake/Output this shift: Total I/O In: 108.4 [I.V.:58.4; NG/GT:50] Out: 125 [Urine:125] Nutritional status:    Neurologic Exam: (off sedation) Mental status: opens eyes to voice, quickly falls back asleep if not constant stimulation. Follows simple commands (open eyes, move hands) CN 2-12: pupils 2 mm, reactive to light.  No nystagmus.  Face seems to be symmetric.  Tongue: intubated. Motor: minimal symmetric movement of all extremities UE>LE Sensory: no response to painful stimuli  DTR's: unable to elicit.  Plantars: no tested. Coordination and gait: unable to test.   Lab Results: Basic Metabolic Panel:  Recent Labs Lab 06/07/14 1350 06/07/14 1357 06/08/14 0234 06/09/14 0535  NA 142 146* 140 145  K 3.5 3.4* 3.9 4.3  CL 109 103 106 105  CO2 23  --  29 28  GLUCOSE 224* 227* 123* 113*  BUN 22 25* 25* 33*  CREATININE 1.14 1.10 1.20 1.21  CALCIUM 7.8*  --  8.2* 8.7  MG 1.7  --   --  2.1  PHOS 4.8*  --   --  4.8*    Liver Function Tests:  Recent Labs Lab 06/07/14 1350  AST 450*  ALT 637*   ALKPHOS 98  BILITOT 0.4  PROT 5.7*  ALBUMIN 2.9*   No results for input(s): LIPASE, AMYLASE in the last 168 hours. No results for input(s): AMMONIA in the last 168 hours.  CBC:  Recent Labs Lab 06/07/14 1350 06/07/14 1357 06/08/14 0234 06/09/14 0535  WBC 9.2  --  5.5 7.4  NEUTROABS 5.6  --   --   --   HGB 10.5* 11.6* 11.1* 12.5*  HCT 32.9* 34.0* 34.1* 38.2*  MCV 94.0  --  92.9 95.3  PLT 164  --  154 180    Cardiac Enzymes:  Recent Labs Lab 06/07/14 1350  TROPONINI <0.03    Lipid Panel: No results for input(s): CHOL, TRIG, HDL, CHOLHDL, VLDL, LDLCALC in the last 168 hours.  CBG:  Recent Labs Lab 06/08/14 1604 06/08/14 2059 06/09/14 0029 06/09/14 0507 06/09/14 0728  GLUCAP 96 117* 116* 103* 33    Microbiology: Results for orders placed or performed during the hospital encounter of 06/07/14  MRSA PCR Screening     Status: None   Collection Time: 06/07/14  5:09 PM  Result Value Ref Range Status   MRSA by PCR NEGATIVE NEGATIVE Final    Comment:        The GeneXpert MRSA Assay (FDA approved for NASAL specimens only), is one component of a comprehensive MRSA colonization surveillance program. It is not intended to diagnose MRSA  infection nor to guide or monitor treatment for MRSA infections.     Coagulation Studies:  Recent Labs  06/07/14 1350 06/07/14 1755 06/08/14 0234  LABPROT 15.0 15.6* 15.0  INR 1.17 1.23 1.16    Imaging: Ct Angio Chest Pe W/cm &/or Wo Cm  06/07/2014   CLINICAL DATA:  74 year old male with witnessed cardiac arrest this morning, status post CPR.  EXAM: CT ANGIOGRAPHY CHEST WITH CONTRAST  TECHNIQUE: Multidetector CT imaging of the chest was performed using the standard protocol during bolus administration of intravenous contrast. Multiplanar CT image reconstructions and MIPs were obtained to evaluate the vascular anatomy.  CONTRAST:  179mL OMNIPAQUE IOHEXOL 350 MG/ML SOLN  COMPARISON:  No priors.  FINDINGS: Study is  exceedingly limited for evaluation of pulmonary embolism secondary to patient's large body habitus, inability to images a patient with the arms in the up position, and suboptimal contrast bolus.  Mediastinum/Lymph Nodes: No central or lobar filling defect is noted to suggest large pulmonary embolism. Smaller segmental and subsegmental size filling defects cannot be excluded on today's examination secondary to the limitations discussed above. Pulmonic trunk is dilated measuring 4.1 cm in diameter, suggesting underlying pulmonary arterial hypertension. Cardiomegaly with left ventricular dilatation. There is atherosclerosis of the thoracic aorta, the great vessels of the mediastinum and the coronary arteries, including calcified atherosclerotic plaque in the left main, left anterior descending and right coronary arteries. There is no significant pericardial fluid, thickening or pericardial calcification. No pathologically enlarged mediastinal or hilar lymph nodes. Esophagus is unremarkable in appearance. No axillary lymphadenopathy. Nasogastric tube extends at least to the stomach (it extends below the lower margin of the images). Endotracheal tube in position with tip terminating approximately 2.4 cm above the carina.  Lungs/Pleura: Dependent subsegmental atelectasis in the lower lobes of the lungs bilaterally (right greater than left). Small amount of dependent airspace consolidation in the posterior aspect of the right upper lobe and in the inferior right lower lobe which may suggest mild aspiration given the patient's history. No pneumothorax. No pleural effusions.  Upper Abdomen: Small calcified gallstone in the gallbladder. Diffuse low attenuation of the hepatic parenchyma, indicative of hepatic steatosis.  Musculoskeletal/Soft Tissues: No acute displaced fractures or aggressive appearing lytic or blastic lesions are noted in the visualized portions of the skeleton.  Review of the MIP images confirms the above  findings.  IMPRESSION: 1. Exceedingly limited examination demonstrating no central or lobar sized pulmonary embolism. Segmental and subsegmental sized emboli cannot be excluded. 2. Dependent airspace consolidation in the posterior right upper lobe and dependent portion of the right lower lobe, likely sequela of mild aspiration. 3. Cardiomegaly with left ventricular dilatation. 4. Dilatation of the pulmonic trunk (4.1 cm in diameter), suggestive of pulmonary arterial hypertension. 5. Atherosclerosis, including left main and 2 vessel coronary artery disease. Assessment for potential risk factor modification, dietary therapy or pharmacologic therapy may be warranted, if clinically indicated. 6. Mild dependent subsegmental atelectasis in the lower lobes of the lungs bilaterally. 7. Hepatic steatosis. 8. Cholelithiasis. 9. Support apparatus, as above.   Electronically Signed   By: Vinnie Langton M.D.   On: 06/07/2014 16:33   Dg Chest Port 1 View  06/09/2014   CLINICAL DATA:  History of endotracheal tube  EXAM: PORTABLE CHEST - 1 VIEW  COMPARISON:  06/07/2014  FINDINGS: Endotracheal tube in good position. Left subclavian central venous catheter tip in the SVC also unchanged. NG tube in the stomach  Hypoventilation with bibasilar atelectasis/ infiltrate left greater than right is unchanged  from the prior study. Negative for edema or effusion.  IMPRESSION: Hypoventilation with bibasilar consolidation unchanged. No new findings. Support lines in satisfactory position.   Electronically Signed   By: Franchot Gallo M.D.   On: 06/09/2014 07:37   Dg Chest Port 1 View  06/07/2014   CLINICAL DATA:  Central line placement.  EXAM: PORTABLE CHEST - 1 VIEW  COMPARISON:  01/20/2014  FINDINGS: Endotracheal tube has tip 3.8 cm above the carina. Nasogastric tube courses into the region of the stomach and off the inferior portion of the film. Left subclavian central venous catheter is present with tip in the region of the SVC at the  level of the carina. No evidence of pneumothorax.  Lungs are moderately hypoinflated with stable elevation of the right hemidiaphragm. Mild stable cardiomegaly. Remainder the exam is unchanged.  IMPRESSION: Hypoinflation without evidence of acute disease. Mild stable cardiomegaly.  Tubes and lines as described.   Electronically Signed   By: Marin Olp M.D.   On: 06/07/2014 17:30    Medications:  Scheduled: . antiseptic oral rinse  7 mL Mouth Rinse QID  . aspirin  325 mg Per Tube Daily  . chlorhexidine  15 mL Mouth Rinse BID  . famotidine (PEPCID) IV  20 mg Intravenous Q12H  . feeding supplement (PRO-STAT SUGAR FREE 64)  60 mL Per Tube QID  . feeding supplement (VITAL HIGH PROTEIN)  1,000 mL Per Tube Q24H  . insulin aspart  0-20 Units Subcutaneous 6 times per day  . levothyroxine  75 mcg Per Tube Daily  . rosuvastatin  10 mg Per Tube q1800    Assessment/Plan:  74 y/o with severe post cardiac arrest anoxic ischemic encephalopathy, currently undergoing hypothermia protocol. No paroxysmal movements noted at this time. Off sedation noted to be following commands and moving extremities. Optimistic for good neurological recovery. Will continue to follow as needed.     LOS: 2 days   Jim Like, DO [facility] [phone]  If 7pm- 7am, please page neurology on call as listed in [place]. 06/09/2014  9:27 AM

## 2014-06-09 NOTE — Progress Notes (Signed)
ANTICOAGULATION CONSULT NOTE - Follow Up Consult  Pharmacy Consult for heparin  Indication: r/o ACS and PE  Allergies  Allergen Reactions  . Bee Venom Swelling    Swelling on lips and tongue  . Codeine   . Demerol [Meperidine]   . Pioglitazone     Lips swelling  . Shrimp [Shellfish Allergy]     rash  . Sulfa Antibiotics     Patient Measurements: Height: 6' (182.9 cm) Weight: (!) 334 lb 2.1 oz (151.56 kg) IBW/kg (Calculated) : 77.6 Heparin Dosing Weight: 109kg  Vital Signs: Temp: 99.1 F (37.3 C) (03/15 1900) Temp Source: Core (Comment) (03/15 1900) BP: 104/58 mmHg (03/15 1900) Pulse Rate: 95 (03/15 1900)  Labs:  Recent Labs  06/07/14 1350 06/07/14 1357 06/07/14 1755  06/08/14 0234 06/08/14 2230 06/09/14 0535 06/09/14 0750 06/09/14 1845  HGB 10.5* 11.6*  --   --  11.1*  --  12.5*  --   --   HCT 32.9* 34.0*  --   --  34.1*  --  38.2*  --   --   PLT 164  --   --   --  154  --  180  --   --   APTT 31  --  64*  --  100*  --   --   --   --   LABPROT 15.0  --  15.6*  --  15.0  --   --   --   --   INR 1.17  --  1.23  --  1.16  --   --   --   --   HEPARINUNFRC  --   --   --   < > 0.51 0.74*  --  0.85* 0.32  CREATININE 1.14 1.10  --   --  1.20  --  1.21  --   --   TROPONINI <0.03  --   --   --   --   --   --   --   --   < > = values in this interval not displayed.  Estimated Creatinine Clearance: 82.4 mL/min (by C-G formula based on Cr of 1.21).   Medications:  Scheduled:  . amLODipine  10 mg Oral Daily  . antiseptic oral rinse  7 mL Mouth Rinse BID  . aspirin  325 mg Per Tube Daily  . famotidine (PEPCID) IV  20 mg Intravenous Q12H  . feeding supplement (PRO-STAT SUGAR FREE 64)  60 mL Per Tube QID  . feeding supplement (VITAL HIGH PROTEIN)  1,000 mL Per Tube Q24H  . insulin aspart  0-20 Units Subcutaneous 6 times per day  . levothyroxine  75 mcg Per Tube Daily  . rosuvastatin  10 mg Per Tube q1800   Infusions:  . sodium chloride 10 mL/hr at 06/08/14 0800   . heparin 1,000 Units/hr (06/09/14 0930)  . propofol Stopped (06/09/14 1000)    Assessment: 74 yo male s/p cardiac arrest s/p hypothermia protocol and now rewarmed. He is on heparin for r/o ACS and r/o PE.   Repeat heparin level after dose decrease is therapeutic at 0.32.   Goal of Therapy:  Heparin level 0.3-0.7 units/ml Monitor platelets by anticoagulation protocol: Yes   Plan:  Continue heparin at 1000 units/hr Daily heparin level and CBC  Sloan Leiter, PharmD, BCPS Clinical Pharmacist [phone]   06/09/2014 7:40 PM

## 2014-06-09 NOTE — Procedures (Signed)
**Note De-Identified Emonni Depasquale Obfuscation** Extubation Procedure Note  Patient Details:   Name: Rickey Rice DOB: 07-15-1940 MRN: [id number]   Airway Documentation:     Evaluation  O2 sats: stable throughout Complications: No apparent complications Patient did tolerate procedure well. Bilateral Breath Sounds: Diminished, Clear Suctioning: Airway Yes + leak, no stridor noted Kayliah Tindol, Penni Bombard 06/09/2014, 10:42 AM

## 2014-06-10 ENCOUNTER — Inpatient Hospital Stay (HOSPITAL_COMMUNITY): Payer: Medicare Other

## 2014-06-10 LAB — PHOSPHORUS: Phosphorus: 4.8 mg/dL — ABNORMAL HIGH (ref 2.3–4.6)

## 2014-06-10 LAB — CBC
HCT: 38 % — ABNORMAL LOW (ref 39.0–52.0)
Hemoglobin: 12.3 g/dL — ABNORMAL LOW (ref 13.0–17.0)
MCH: 30.6 pg (ref 26.0–34.0)
MCHC: 32.4 g/dL (ref 30.0–36.0)
MCV: 94.5 fL (ref 78.0–100.0)
Platelets: 188 10*3/uL (ref 150–400)
RBC: 4.02 MIL/uL — AB (ref 4.22–5.81)
RDW: 14.2 % (ref 11.5–15.5)
WBC: 7.3 10*3/uL (ref 4.0–10.5)

## 2014-06-10 LAB — MAGNESIUM: MAGNESIUM: 1.9 mg/dL (ref 1.5–2.5)

## 2014-06-10 LAB — GLUCOSE, CAPILLARY
GLUCOSE-CAPILLARY: 104 mg/dL — AB (ref 70–99)
GLUCOSE-CAPILLARY: 108 mg/dL — AB (ref 70–99)
GLUCOSE-CAPILLARY: 123 mg/dL — AB (ref 70–99)
Glucose-Capillary: 87 mg/dL (ref 70–99)
Glucose-Capillary: 95 mg/dL (ref 70–99)
Glucose-Capillary: 170 mg/dL — ABNORMAL HIGH (ref 70–99)

## 2014-06-10 LAB — BASIC METABOLIC PANEL
ANION GAP: 12 (ref 5–15)
BUN: 39 mg/dL — ABNORMAL HIGH (ref 6–23)
CHLORIDE: 107 mmol/L (ref 96–112)
CO2: 27 mmol/L (ref 19–32)
Calcium: 9 mg/dL (ref 8.4–10.5)
Creatinine, Ser: 1.42 mg/dL — ABNORMAL HIGH (ref 0.50–1.35)
GFR, EST AFRICAN AMERICAN: 55 mL/min — AB (ref >90)
GFR, EST NON AFRICAN AMERICAN: 47 mL/min — AB (ref >90)
Glucose, Bld: 104 mg/dL — ABNORMAL HIGH (ref 70–99)
POTASSIUM: 3.5 mmol/L (ref 3.5–5.1)
SODIUM: 146 mmol/L — AB (ref 135–145)

## 2014-06-10 LAB — HEPARIN LEVEL (UNFRACTIONATED)
HEPARIN UNFRACTIONATED: 0.17 [IU]/mL — AB (ref 0.30–0.70)
Heparin Unfractionated: 0.19 IU/mL — ABNORMAL LOW (ref 0.30–0.70)

## 2014-06-10 LAB — TRIGLYCERIDES: Triglycerides: 68 mg/dL (ref <150)

## 2014-06-10 MED ORDER — POTASSIUM CHLORIDE CRYS ER 20 MEQ PO TBCR
40.0000 meq | EXTENDED_RELEASE_TABLET | Freq: Three times a day (TID) | ORAL | Status: AC
  Administered 2014-06-10 (×2): 40 meq via ORAL
  Filled 2014-06-10 (×2): qty 2

## 2014-06-10 MED ORDER — RESOURCE THICKENUP CLEAR PO POWD
ORAL | Status: DC | PRN
  Filled 2014-06-10: qty 125

## 2014-06-10 MED ORDER — TECHNETIUM TC 99M DIETHYLENETRIAME-PENTAACETIC ACID: 40.0000 | Freq: Once | INTRAVENOUS | Status: AC | PRN

## 2014-06-10 MED ORDER — TECHNETIUM TO 99M ALBUMIN AGGREGATED
6.0000 | Freq: Once | INTRAVENOUS | Status: AC | PRN
  Administered 2014-06-10: 6 via INTRAVENOUS

## 2014-06-10 NOTE — Progress Notes (Signed)
   Discrepancy between aline and bp cuff with aline showing sbop 150  Plan Dc alione  Dr. Brand Males, M.D., [facility].C.P Pulmonary and Critical Care Medicine Staff Physician [place] [facility] Pager: [phone], If no answer or between  15:00h - 7:00h: call [phone]  06/10/2014 1:40 AM

## 2014-06-10 NOTE — Progress Notes (Signed)
Patient Name: Rickey Rice Date of Encounter: 06/10/2014  Active Problems:   PEA (Pulseless electrical activity)   Anoxic encephalopathy   Cardiac arrest   Acute respiratory failure with hypoxemia   Congestive dilated cardiomyopathy   Essential hypertension   Length of Stay: 3  SUBJECTIVE  The patient feels significantly better, he is going to walk with physical therapy. No chest pain, improved SOB.   CURRENT MEDS . amLODipine  10 mg Oral Daily  . antiseptic oral rinse  7 mL Mouth Rinse BID  . aspirin  325 mg Per Tube Daily  . famotidine (PEPCID) IV  20 mg Intravenous Q12H  . insulin aspart  0-20 Units Subcutaneous 6 times per day  . levothyroxine  75 mcg Per Tube Daily  . rosuvastatin  10 mg Per Tube q1800    OBJECTIVE  Filed Vitals:   06/10/14 0600 06/10/14 0800 06/10/14 0900 06/10/14 1000  BP: 131/66 147/84 118/63   Pulse: 91 85 87   Temp:  97.9 F (36.6 C) 97.5 F (36.4 C) 98.2 F (36.8 C)  TempSrc:  Core (Comment) Core (Comment) Core (Comment)  Resp: 21 14 20    Height:      Weight:      SpO2: 100% 100% 99%     Intake/Output Summary (Last 24 hours) at 06/10/14 1107 Last data filed at 06/10/14 0800  Gross per 24 hour  Intake  470.7 ml  Output   3010 ml  Net -2539.3 ml   Filed Weights   06/08/14 0500 06/09/14 0500 06/10/14 0500  Weight: 336 lb 15.9 oz (152.86 kg) 334 lb 2.1 oz (151.56 kg) 317 lb 9.5 oz (144.06 kg)    PHYSICAL EXAM  General: Extubated Neuro: Alert and oriented X 2. Moves all extremities spontaneously. Psych: Normal affect. HEENT:  Normal  Neck: Supple without bruits or JVD. Lungs:  Resp regular and unlabored, CTA Heart: RRR no s3, s4, or murmurs. Abdomen: Soft, non-tender, non-distended, BS + x 4.  Extremities: No clubbing, cyanosis , mild non-pitting edema B/L. DP/PT/Radials 2+ and equal bilaterally.  Accessory Clinical Findings  CBC  Recent Labs  06/07/14 1350  06/09/14 0535 06/10/14 0400  WBC 9.2  < > 7.4 7.3    NEUTROABS 5.6  --   --   --   HGB 10.5*  < > 12.5* 12.3*  HCT 32.9*  < > 38.2* 38.0*  MCV 94.0  < > 95.3 94.5  PLT 164  < > 180 188  < > = values in this interval not displayed. Basic Metabolic Panel  Recent Labs  06/09/14 0535 06/10/14 0400  NA 145 146*  K 4.3 3.5  CL 105 107  CO2 28 27  GLUCOSE 113* 104*  BUN 33* 39*  CREATININE 1.21 1.42*  CALCIUM 8.7 9.0  MG 2.1 1.9  PHOS 4.8* 4.8*   Liver Function Tests  Recent Labs  06/07/14 1350  AST 450*  ALT 637*  ALKPHOS 98  BILITOT 0.4  PROT 5.7*  ALBUMIN 2.9*   Cardiac Enzymes  Recent Labs  06/07/14 1350  TROPONINI <0.03    Recent Labs  06/08/14 0235  TSH 0.344*   Radiology/Studies  Ct Angio Chest Pe W/cm &/or Wo Cm  06/07/2014   CLINICAL DATA:  74 year old male with witnessed cardiac arrest this morning, status post CPR.   IMPRESSION: 1. Exceedingly limited examination demonstrating no central or lobar sized pulmonary embolism. Segmental and subsegmental sized emboli cannot be excluded. 2. Dependent airspace consolidation in the  posterior right upper lobe and dependent portion of the right lower lobe, likely sequela of mild aspiration. 3. Cardiomegaly with left ventricular dilatation. 4. Dilatation of the pulmonic trunk (4.1 cm in diameter), suggestive of pulmonary arterial hypertension. 5. Atherosclerosis, including left main and 2 vessel coronary artery disease. Assessment for potential risk factor modification, dietary therapy or pharmacologic therapy may be warranted, if clinically indicated. 6. Mild dependent subsegmental atelectasis in the lower lobes of the lungs bilaterally. 7. Hepatic steatosis. 8. Cholelithiasis. 9. Support apparatus, as above.   Electronically Signed   By: Vinnie Langton M.D.   On: 06/07/2014 16:33   Dg Chest Port 1 View  06/07/2014   CLINICAL DATA:  Central line placement.    IMPRESSION: Hypoinflation without evidence of acute disease. Mild stable cardiomegaly.  Tubes and lines as  described.   Electronically Signed   By: Marin Olp M.D.   On: 06/07/2014 17:30   Echocardiogram: 06/07/2014 Left ventricle: Diffuse hypokinesis with abnormal septal motion from LBBB. The cavity size was moderately dilated. Wall thickness was normal. The estimated ejection fraction was 40%. - Aortic valve: There was trivial regurgitation. - Aorta: Some shadowing artifact normal diameter - Atrial septum: No defect or patent foramen ovale was identified.  TELE: SR, frequent PVCs  ECG: SR, LBBB, LAD    ASSESSMENT AND PLAN  74 yo with no documented CAD. Chronic LBBB 05/02/12 had normal myovue pre knee surgery in October 2015 with LVEF 49%. S/P bilateral TKR;s CRF Morbid obesity, elevated lipids and HTN. [place] at church--> PEA--> V fib x 3 shocks. He underwent  cooling protocol, rewarmed, extubated on 06/08/2014, off levophed and started on amlodipine 10 mg po daily, BP now controlled.  He had elevated D-dimer, poor quality chest CT with low suspicion for pulmonary embolism. V/Q scan is scheduled.  His troponin was negative x 2, ECG is unchanged from 2015. No AVB on ECG.  Echo shows diffuse hypokinesis with LVEF 40%.   He was fluid overloaded, he diuresed well in the last 48 hours 9 lbs, now worsening Crea 1.2 --> 1.4, I would hold Lasix,  If negative V/Q scan, we would plan for a Cath tomorrow or Friday based on kidney function.   He will require ACEI or BiDil after the cath for low EF.    Signed, Dorothy Spark MD, [facility] 06/10/2014

## 2014-06-10 NOTE — Progress Notes (Signed)
Offered to bath pt, but pt refused.

## 2014-06-10 NOTE — Progress Notes (Signed)
ANTICOAGULATION CONSULT NOTE - Follow Up Consult  Pharmacy Consult for heparin Indication: r/o ACS/PE s/p cardiac arrest  Labs:  Recent Labs  06/07/14 1350 06/07/14 1357 06/07/14 1755  06/08/14 0234  06/09/14 0535 06/09/14 0750 06/09/14 1845 06/10/14 0400 06/10/14 0420  HGB 10.5* 11.6*  --   --  11.1*  --  12.5*  --   --  12.3*  --   HCT 32.9* 34.0*  --   --  34.1*  --  38.2*  --   --  38.0*  --   PLT 164  --   --   --  154  --  180  --   --  188  --   APTT 31  --  64*  --  100*  --   --   --   --   --   --   LABPROT 15.0  --  15.6*  --  15.0  --   --   --   --   --   --   INR 1.17  --  1.23  --  1.16  --   --   --   --   --   --   HEPARINUNFRC  --   --   --   < > 0.51  < >  --  0.85* 0.32  --  0.17*  CREATININE 1.14 1.10  --   --  1.20  --  1.21  --   --   --   --   TROPONINI <0.03  --   --   --   --   --   --   --   --   --   --   < > = values in this interval not displayed.   Assessment: 74yo male now subtherapeutic on heparin after one level at low end of goal; likely now requiring higher rates of heparin after >24hr rewarmed.  Goal of Therapy:  Heparin level 0.3-0.7 units/ml   Plan:  Will increase heparin gtt by 1-2 units/kg/hr to 1200 units/hr; was previously above goal at this rate so will not increase above this rate for now but may need more aggressive adjustments if level remains low.  Wynona Neat, PharmD, BCPS  06/10/2014,6:02 AM

## 2014-06-10 NOTE — Evaluation (Signed)
Clinical/Bedside Swallow Evaluation Patient Details  Name: Rickey Rice MRN: [id number] Date of Birth: 24-Jan-1941  Today's Date: 06/10/2014 Time: SLP Start Time (ACUTE ONLY): 0815 SLP Stop Time (ACUTE ONLY): 0827 SLP Time Calculation (min) (ACUTE ONLY): 12 min  Past Medical History:  Past Medical History  Diagnosis Date  . Diabetes mellitus without complication     type 2  . Hypertension   . Hypothyroidism   . Morbid obesity   . Dyslipidemia   . LBBB (left bundle branch block)   . History of nuclear stress test 04/2012    lexiscan - 2 day protocol; low risk study, evidence of inferrior & apical scar but no ischemia   . Arthritis   . Shortness of breath     with exertion   . Prostate enlargement    Past Surgical History:  Past Surgical History  Procedure Laterality Date  . Mouth surgery  1963  . Finger surgery  1954  . Colonoscopy w/ biopsies    . Knee arthroscopy Left   . Total knee arthroplasty Right 02/05/2013    Dr Mayer Camel  . Total knee arthroplasty Right 02/05/2013    Procedure: TOTAL KNEE ARTHROPLASTY;  Surgeon: Kerin Salen, MD;  Location: [place];  Service: Orthopedics;  Laterality: Right;  . Total knee arthroplasty Left 01/09/2014    DR Mayer Camel  . Total knee arthroplasty Left 01/19/2014    Procedure: LEFT TOTAL KNEE ARTHROPLASTY;  Surgeon: Kerin Salen, MD;  Location: [place];  Service: Orthopedics;  Laterality: Left;   HPI:  40 M with obesity, prior bilateral knee surgeries, chronic LBBB, mild cardiomyopathy suffered cardiac arrest while attending church. Initial rhythm was shockable via AED then PEA. Subsequently VF. ROSC in approx 15 mins. Was purposeful in ED but required intubation after he removed [facility] airway. Intubated from 3/13  to 3/15.   Assessment / Plan / Recommendation Clinical Impression  Pt demonatrates signs of an acute reversible dysphagia following brief intubation. Pt with coughing, throat clearing immediately following sips of thin liquids.  Symptoms eliminated with nectar thick liquids. Expect pt to progress back to thin liquids in the short term. For now, recommend dys 3 (mechanical soft) diet with nectar thick liquids. SLP will follow for tolerance and upgrade.     Aspiration Risk  Moderate    Diet Recommendation Dysphagia 3 (Mechanical Soft);Nectar-thick liquid   Liquid Administration via: Cup Medication Administration: Whole meds with puree Supervision: Staff to assist with self feeding;Patient able to self feed Compensations: Slow rate;Small sips/bites Postural Changes and/or Swallow Maneuvers: Seated upright 90 degrees    Other  Recommendations Oral Care Recommendations: Oral care BID Other Recommendations: Order thickener from pharmacy   Follow Up Recommendations  24 hour supervision/assistance    Frequency and Duration min 2x/week  2 weeks   Pertinent Vitals/Pain NA    SLP Swallow Goals     Swallow Study Prior Functional Status       General HPI: 31 M with obesity, prior bilateral knee surgeries, chronic LBBB, mild cardiomyopathy suffered cardiac arrest while attending church. Initial rhythm was shockable via AED then PEA. Subsequently VF. ROSC in approx 15 mins. Was purposeful in ED but required intubation after he removed [facility] airway. Intubated from 3/13  to 3/15. Type of Study: Bedside swallow evaluation Diet Prior to this Study: NPO Temperature Spikes Noted: No Respiratory Status: Room air History of Recent Intubation: Yes Length of Intubations (days): 3 days Behavior/Cognition: Alert;Cooperative;Pleasant mood Oral Cavity - Dentition: Adequate natural dentition;Missing dentition  Self-Feeding Abilities: Able to feed self Patient Positioning: Upright in bed Baseline Vocal Quality: Hoarse Volitional Cough: Strong Volitional Swallow: Able to elicit    Oral/Motor/Sensory Function Overall Oral Motor/Sensory Function: Appears within functional limits for tasks assessed   Ice Chips     Thin Liquid  Thin Liquid: Impaired Presentation: Cup;Self Fed Pharyngeal  Phase Impairments: Throat Clearing - Immediate;Cough - Immediate    Nectar Thick Nectar Thick Liquid: Within functional limits   Honey Thick Honey Thick Liquid: Not tested   Puree Puree: Within functional limits   Solid   GO    Solid: Within functional limits      [facility], MA CCC-SLP [phone]  Lynann Beaver 06/10/2014,8:30 AM

## 2014-06-10 NOTE — Evaluation (Signed)
Physical Therapy Evaluation Patient Details Name: Rickey Rice MRN: [id number] DOB: 19-May-1940 Today's Date: 06/10/2014   History of Present Illness  Pt adm with cardiac arrest. Extubated 3/15. PMH - Bil TKA, obesity, DM, HTN, LBBB  Clinical Impression  Pt admitted with above diagnosis. Pt currently with functional limitations due to the deficits listed below (see PT Problem List).  Pt will benefit from skilled PT to increase their independence and safety with mobility to allow discharge to the venue listed below.  Pt doing very well and expect he will be able to return home with family.     Follow Up Recommendations Home health PT;Supervision/Assistance - 24 hour    Equipment Recommendations       Recommendations for Other Services       Precautions / Restrictions Precautions Precautions: Fall Restrictions Weight Bearing Restrictions: No      Mobility  Bed Mobility Overal bed mobility: Needs Assistance Bed Mobility: Supine to Sit     Supine to sit: +2 for physical assistance;Mod assist;HOB elevated     General bed mobility comments: assist to bring legs over and elevate trunk.  Transfers Overall transfer level: Needs assistance Equipment used: Rolling walker (2 wheeled) Transfers: Sit to/from Stand Sit to Stand: +2 physical assistance;Min assist         General transfer comment: Assist to bring hips up.  Ambulation/Gait Ambulation/Gait assistance: +2 safety/equipment;Min guard Ambulation Distance (Feet): 200 Feet Assistive device: Rolling walker (2 wheeled) Gait Pattern/deviations: Step-through pattern;Decreased step length - right;Decreased step length - left;Trunk flexed Gait velocity: decr Gait velocity interpretation: Below normal speed for age/gender General Gait Details: Verbal cues to stand more erect.  Stairs            Wheelchair Mobility    Modified Rankin (Stroke Patients Only)       Balance Overall balance assessment: Needs  assistance Sitting-balance support: No upper extremity supported;Feet supported Sitting balance-Leahy Scale: Good     Standing balance support: Bilateral upper extremity supported Standing balance-Leahy Scale: Poor Standing balance comment: support of walker                             Pertinent Vitals/Pain Pain Assessment: No/denies pain    Home Living Family/patient expects to be discharged to:: Private residence Living Arrangements: Spouse/significant other;Children Available Help at Discharge: Family;Available 24 hours/day Type of Home: House Home Access: Stairs to enter   [employer] of Steps: 2-3 Home Layout: One level Home Equipment: Walker - 4 wheels;Bedside commode;Shower seat      Prior Function Level of Independence: Independent with assistive device(s)         Comments: Uses cane at times     Hand Dominance   Dominant Hand: Right    Extremity/Trunk Assessment   Upper Extremity Assessment: Overall WFL for tasks assessed           Lower Extremity Assessment: Generalized weakness         Communication   Communication: No difficulties  Cognition Arousal/Alertness: Awake/alert Behavior During Therapy: WFL for tasks assessed/performed Overall Cognitive Status: Within Functional Limits for tasks assessed                      General Comments      Exercises        Assessment/Plan    PT Assessment Patient needs continued PT services  PT Diagnosis Difficulty walking;Generalized weakness   PT Problem List Decreased  strength;Decreased activity tolerance;Decreased balance;Decreased mobility;Obesity  PT Treatment Interventions DME instruction;Gait training;Balance training;Functional mobility training;Patient/family education;Therapeutic activities;Therapeutic exercise   PT Goals (Current goals can be found in the Care Plan section) Acute Rehab PT Goals Patient Stated Goal: Return home PT Goal Formulation: With  patient Time For Goal Achievement: 06/17/14 Potential to Achieve Goals: Good    Frequency Min 3X/week   Barriers to discharge        Co-evaluation               End of Session Equipment Utilized During Treatment: Gait belt Activity Tolerance: Patient tolerated treatment well Patient left: in chair;with call bell/phone within reach;with family/visitor present Nurse Communication: Mobility status         Time: 0929-5747 PT Time Calculation (min) (ACUTE ONLY): 26 min   Charges:   PT Evaluation $Initial PT Evaluation Tier I: 1 Procedure PT Treatments $Gait Training: 8-22 mins   PT G Codes:        Diarra Ceja 2014-06-15, 11:44 AM  Suanne Marker PT [phone]

## 2014-06-10 NOTE — Progress Notes (Signed)
ANTICOAGULATION CONSULT NOTE - Follow Up Consult  Pharmacy Consult for heparin  Indication: r/o ACS, r/o PE  Allergies  Allergen Reactions  . Bee Venom Swelling    Swelling on lips and tongue  . Codeine   . Demerol [Meperidine]   . Pioglitazone     Lips swelling  . Shrimp [Shellfish Allergy]     rash  . Sulfa Antibiotics     Patient Measurements: Height: 6' (182.9 cm) Weight: (!) 317 lb 9.5 oz (144.06 kg) IBW/kg (Calculated) : 77.6 Heparin Dosing Weight: 109kg  Vital Signs: Temp: 98.2 F (36.8 C) (03/16 1200) Temp Source: Oral (03/16 1200) BP: 106/52 mmHg (03/16 1200) Pulse Rate: 84 (03/16 1200)  Labs:  Recent Labs  06/07/14 1350  06/07/14 1755  06/08/14 0234  06/09/14 0535  06/09/14 1845 06/10/14 0400 06/10/14 0420 06/10/14 1200  HGB 10.5*  < >  --   --  11.1*  --  12.5*  --   --  12.3*  --   --   HCT 32.9*  < >  --   --  34.1*  --  38.2*  --   --  38.0*  --   --   PLT 164  --   --   --  154  --  180  --   --  188  --   --   APTT 31  --  64*  --  100*  --   --   --   --   --   --   --   LABPROT 15.0  --  15.6*  --  15.0  --   --   --   --   --   --   --   INR 1.17  --  1.23  --  1.16  --   --   --   --   --   --   --   HEPARINUNFRC  --   --   --   < > 0.51  < >  --   < > 0.32  --  0.17* 0.19*  CREATININE 1.14  < >  --   --  1.20  --  1.21  --   --  1.42*  --   --   TROPONINI <0.03  --   --   --   --   --   --   --   --   --   --   --   < > = values in this interval not displayed.  Estimated Creatinine Clearance: 68.3 mL/min (by C-G formula based on Cr of 1.42).   Medications:  Scheduled:  . amLODipine  10 mg Oral Daily  . antiseptic oral rinse  7 mL Mouth Rinse BID  . aspirin  325 mg Per Tube Daily  . famotidine (PEPCID) IV  20 mg Intravenous Q12H  . insulin aspart  0-20 Units Subcutaneous 6 times per day  . levothyroxine  75 mcg Per Tube Daily  . potassium chloride  40 mEq Oral TID  . rosuvastatin  10 mg Per Tube q1800   Infusions:  . sodium  chloride 10 mL/hr at 06/10/14 1100  . heparin 1,200 Units/hr (06/10/14 1100)    Assessment: 74 yo male s/p cardiac arrest on hypothermia protocol and now rewarmed. He is on heparin for r/o PE. CT study was limited and noted for VQ scan (d-dimer= 5.21), Heparin level is below goal (HL= 0.19) after increase to 1200 units/hr.  Goal of Therapy:  Heparin level 0.3-0.7 units/ml Monitor platelets by anticoagulation protocol: Yes   Plan:  -Increase heparin to 1450 units/hr -Heparin level in 8hrs  Hildred Laser, Pharm D 06/10/2014 1:52 PM

## 2014-06-10 NOTE — Progress Notes (Signed)
PULMONARY / CRITICAL CARE MEDICINE   Name: Rickey Rice MRN: [id number] DOB: 1940/12/20    ADMISSION DATE:  06/07/2014  INITIAL PRESENTATION:  30 M with obesity, prior bilateral knee surgeries, chronic LBBB, mild cardiomyopathy suffered cardiac arrest while attending church. Initial rhythm was shockable via AED then PEA. Subsequently VF. ROSC in approx 15 mins. Was purposeful in ED but required intubation after he removed [facility] airway. Code cool called and PCCM admitted  MAJOR EVENTS/TEST RESULTS: 3/13 admitted as above  INDWELLING DEVICES:: ETT 3/13 >>  L  CVl 3/13 >>  Radial srt line (ordered) 3/13 >>   MICRO DATA: Blood 3/13 >>   ANTIMICROBIALS:   SUBJECTIVE: No events overnight, awake alert and interactive, no complaints.  VITAL SIGNS: Temp:  [97.5 F (36.4 C)-99.9 F (37.7 C)] 98.2 F (36.8 C) (03/16 1000) Pulse Rate:  [75-98] 87 (03/16 0900) Resp:  [12-23] 20 (03/16 0900) BP: (61-158)/(43-140) 118/63 mmHg (03/16 0900) SpO2:  [96 %-100 %] 99 % (03/16 0900) Arterial Line BP: (131-179)/(78-124) 163/79 mmHg (03/16 0100) Weight:  [144.06 kg (317 lb 9.5 oz)] 144.06 kg (317 lb 9.5 oz) (03/16 0500)  HEMODYNAMICS:    VENTILATOR SETTINGS:    INTAKE / OUTPUT:  Intake/Output Summary (Last 24 hours) at 06/10/14 1101 Last data filed at 06/10/14 0800  Gross per 24 hour  Intake  470.7 ml  Output   3010 ml  Net -2539.3 ml   PHYSICAL EXAMINATION: General: obese, extubated, awake and interactive. Neuro:  Awake, alert and following commands. HEENT: [place]/AT, PERRL, EOM-I and MMM. Cardiovascular: RRR, Nl S1/S2, -M/R/G. Lungs: Coarse BS diffusely. Abdomen:  Obese, soft, NT, ND and +BS Ext: B well healed knee surgical scars, no edema  LABS:  CBC  Recent Labs Lab 06/08/14 0234 06/09/14 0535 06/10/14 0400  WBC 5.5 7.4 7.3  HGB 11.1* 12.5* 12.3*  HCT 34.1* 38.2* 38.0*  PLT 154 180 188   Coag's  Recent Labs Lab 06/07/14 1350 06/07/14 1755 06/08/14 0234   APTT 31 64* 100*  INR 1.17 1.23 1.16   BMET  Recent Labs Lab 06/08/14 0234 06/09/14 0535 06/10/14 0400  NA 140 145 146*  K 3.9 4.3 3.5  CL 106 105 107  CO2 29 28 27   BUN 25* 33* 39*  CREATININE 1.20 1.21 1.42*  GLUCOSE 123* 113* 104*   Electrolytes  Recent Labs Lab 06/07/14 1350 06/08/14 0234 06/09/14 0535 06/10/14 0400  CALCIUM 7.8* 8.2* 8.7 9.0  MG 1.7  --  2.1 1.9  PHOS 4.8*  --  4.8* 4.8*   Sepsis Markers  Recent Labs Lab 06/07/14 1357  LATICACIDVEN 3.24*   ABG  Recent Labs Lab 06/07/14 1510 06/09/14 0325  PHART 7.284* 7.366  PCO2ART 55.4* 47.8*  PO2ART 263.0* 159.0*   Liver Enzymes  Recent Labs Lab 06/07/14 1350  AST 450*  ALT 637*  ALKPHOS 98  BILITOT 0.4  ALBUMIN 2.9*   Cardiac Enzymes  Recent Labs Lab 06/07/14 1350  TROPONINI <0.03   Glucose  Recent Labs Lab 06/09/14 1129 06/09/14 1620 06/09/14 1951 06/09/14 2353 06/10/14 0330 06/10/14 0825  GLUCAP 92 101* 103* 104* 87 95    Imaging Dg Chest Port 1 View  06/09/2014   CLINICAL DATA:  History of endotracheal tube  EXAM: PORTABLE CHEST - 1 VIEW  COMPARISON:  06/07/2014  FINDINGS: Endotracheal tube in good position. Left subclavian central venous catheter tip in the SVC also unchanged. NG tube in the stomach  Hypoventilation with bibasilar atelectasis/ infiltrate left greater than  right is unchanged from the prior study. Negative for edema or effusion.  IMPRESSION: Hypoventilation with bibasilar consolidation unchanged. No new findings. Support lines in satisfactory position.   Electronically Signed   By: Franchot Gallo M.D.   On: 06/09/2014 07:37   ASSESSMENT / PLAN:  CARDIOVASCULAR A:  PEA > VF arrest Chronic LBBB H/O htn H/O hyperlipidemia Mild cardiomyopathy - recent LVEF 50% Mildly hypertensive, new. P:  D/C pressors. Low dose lopressor added. Continue norvasc at 10 mg daily. Cont statin. Troponins negative Echo EF of 40% with diffuse hypokineses CTA chest  poor quality but no evidence of PE, D-dimer elevated, DVT negative, V/Q scan pending (to be done today), if negative then will d/c heparin. Cards consult appreciated.  PULMONARY A: Acute resp failure post arrest P:   Titrate O2 for saturation of 88-92%. Swallow evaluation noted, improving now compared to yesterday.  RENAL A:   Mild hypernatremia Mild hypokalemia P:   Monitor BMET intermittently Monitor I/Os KVO IVF. Correct electrolytes as indicated. Hold further lasix at this point.  GASTROINTESTINAL A:   Obesity P:   SUP: IV famotidine D/C TF. Diet per speech pathology's recommendations.  HEMATOLOGIC A:   Mild anemia without overt blood loss P:  DVT px: full dose heparin, will d/c if d-dimer is negative and start SQ heparin. Monitor CBC intermittently Transfuse per usual ICU guidelines V/Q scan, if negative then will d/c heparin.  INFECTIOUS A:   No identified infections P:   Monitor temp, WBC count. Micro and abx as above.  ENDOCRINE A:   DM 2 Hypothyroidism  P:   SSI - resistant scale Holding oral DM meds, may need to restart pending diet. Levothyroxine @ home dose of 75 mcg daily TSH 0.344  NEUROLOGIC A:   Post anoxic/ischemic encephalopathy P:   D/C sedation. Mobilize. No need for repeat CT at this point.  FAMILY  - Updates: Patient and family updated bedside.  Will transfer to tele, transfer care to [facility], PCCM will sign off, hold diureses for now, replace electrolytes, advance diet, OOB to chair, PT to mobilize.  V/Q scan to be done this AM, if negative then will stop heparin if positive then will need oral anti-coagulation.  Rush Farmer, M.D. [facility] Pulmonary/Critical Care Medicine. Pager: [phone]. After hours pager: [phone].  06/10/2014, 11:01 AM

## 2014-06-10 NOTE — Progress Notes (Signed)
NUTRITION FOLLOW-UP  DOCUMENTATION CODES Per approved criteria  -Morbid Obesity   INTERVENTION: Provide Magic Cup BID, each provides 290 kcal, 9 grams of protein  NUTRITION DIAGNOSIS: Inadequate oral intake related to acute reversible dysphagia as evidenced by DYS 3 diet.   Goal: Pt to meet >/= 90% of estimated energy needs  Monitor: PO intake, diet advancement, weight trends, labs  ASSESSMENT: 74 y/o male with past medical history of obesity, prior bilateral knee surgeries, chronic LBBB, mild cardiomyopathy suffered cardiac arrest while attending church.  Labs- low Ph; high Cr, BUN, Na Pt extubated and OGT removed 3/15. SLP placed pt on DYS 3 diet with nectar-thick liquid d/t acute reversible dysphagia. Talked with nurse about pt. Vital High Protein and Prostat still ordered; will d/c.  Pt denied any nausea, abdominal pain or diarrhea. He reported feeling hungry, and loves [employer]. Observed pt eat breakfast, and he appears to have no difficulty with DYS 3 diet.  Recalculated energy needs based on current status. Will order Magic Cup BID.   Height: Ht Readings from Last 1 Encounters:  06/07/14 6' (1.829 m)    Weight: Wt Readings from Last 1 Encounters:  06/10/14 317 lb 9.5 oz (144.06 kg)    BMI:  Body mass index is 43.06 kg/(m^2).  Estimated Nutritional Needs: Kcal: 2250-2400  Protein: 120-135 grams Fluid: per MD  Skin: intact   Diet Order: DIET DYS 3  Nectar thick liquids  EDUCATION NEEDS: -No education needs identified at this time   Intake/Output Summary (Last 24 hours) at 06/10/14 1150 Last data filed at 06/10/14 1100  Gross per 24 hour  Intake  586.7 ml  Output   3185 ml  Net -2598.3 ml    Last BM: pta  Labs:   Recent Labs Lab 06/07/14 1350  06/08/14 0234 06/09/14 0535 06/10/14 0400  NA 142  < > 140 145 146*  K 3.5  < > 3.9 4.3 3.5  CL 109  < > 106 105 107  CO2 23  --  29 28 27   BUN 22  < > 25* 33* 39*  CREATININE 1.14  < > 1.20  1.21 1.42*  CALCIUM 7.8*  --  8.2* 8.7 9.0  MG 1.7  --   --  2.1 1.9  PHOS 4.8*  --   --  4.8* 4.8*  GLUCOSE 224*  < > 123* 113* 104*  < > = values in this interval not displayed.  CBG (last 3)   Recent Labs  06/09/14 2353 06/10/14 0330 06/10/14 0825  GLUCAP 104* 87 95    Scheduled Meds: . amLODipine  10 mg Oral Daily  . antiseptic oral rinse  7 mL Mouth Rinse BID  . aspirin  325 mg Per Tube Daily  . famotidine (PEPCID) IV  20 mg Intravenous Q12H  . insulin aspart  0-20 Units Subcutaneous 6 times per day  . levothyroxine  75 mcg Per Tube Daily  . potassium chloride  40 mEq Oral TID  . rosuvastatin  10 mg Per Tube q1800    Continuous Infusions: . sodium chloride 10 mL/hr at 06/10/14 1100  . heparin 1,200 Units/hr (06/10/14 1100)    Wynona Dove, MS Dietetic Intern Pager: [phone]

## 2014-06-11 ENCOUNTER — Inpatient Hospital Stay (HOSPITAL_COMMUNITY): Payer: Medicare Other

## 2014-06-11 DIAGNOSIS — I10 Essential (primary) hypertension: Secondary | ICD-10-CM | POA: Diagnosis present

## 2014-06-11 DIAGNOSIS — E1129 Type 2 diabetes mellitus with other diabetic kidney complication: Secondary | ICD-10-CM | POA: Diagnosis present

## 2014-06-11 DIAGNOSIS — E1122 Type 2 diabetes mellitus with diabetic chronic kidney disease: Secondary | ICD-10-CM

## 2014-06-11 DIAGNOSIS — N189 Chronic kidney disease, unspecified: Secondary | ICD-10-CM

## 2014-06-11 DIAGNOSIS — N182 Chronic kidney disease, stage 2 (mild): Secondary | ICD-10-CM | POA: Diagnosis present

## 2014-06-11 DIAGNOSIS — N400 Enlarged prostate without lower urinary tract symptoms: Secondary | ICD-10-CM | POA: Diagnosis present

## 2014-06-11 LAB — BLOOD GAS, ARTERIAL
Acid-Base Excess: 4.8 mmol/L — ABNORMAL HIGH (ref 0.0–2.0)
Bicarbonate: 28.8 mEq/L — ABNORMAL HIGH (ref 20.0–24.0)
Drawn by: 36496
FIO2: 0.21 %
O2 Saturation: 92.2 %
PO2 ART: 63.7 mmHg — AB (ref 80.0–100.0)
Patient temperature: 98.6
TCO2: 30.1 mmol/L (ref 0–100)
pCO2 arterial: 42.9 mmHg (ref 35.0–45.0)
pH, Arterial: 7.442 (ref 7.350–7.450)

## 2014-06-11 LAB — BASIC METABOLIC PANEL
Anion gap: 10 (ref 5–15)
BUN: 39 mg/dL — ABNORMAL HIGH (ref 6–23)
CALCIUM: 9 mg/dL (ref 8.4–10.5)
CHLORIDE: 106 mmol/L (ref 96–112)
CO2: 28 mmol/L (ref 19–32)
CREATININE: 1.44 mg/dL — AB (ref 0.50–1.35)
GFR calc non Af Amer: 47 mL/min — ABNORMAL LOW (ref >90)
GFR, EST AFRICAN AMERICAN: 54 mL/min — AB (ref >90)
Glucose, Bld: 129 mg/dL — ABNORMAL HIGH (ref 70–99)
Potassium: 3.7 mmol/L (ref 3.5–5.1)
SODIUM: 144 mmol/L (ref 135–145)

## 2014-06-11 LAB — CBC
HCT: 34.4 % — ABNORMAL LOW (ref 39.0–52.0)
Hemoglobin: 11 g/dL — ABNORMAL LOW (ref 13.0–17.0)
MCH: 30.5 pg (ref 26.0–34.0)
MCHC: 32 g/dL (ref 30.0–36.0)
MCV: 95.3 fL (ref 78.0–100.0)
PLATELETS: 175 10*3/uL (ref 150–400)
RBC: 3.61 MIL/uL — ABNORMAL LOW (ref 4.22–5.81)
RDW: 14.5 % (ref 11.5–15.5)
WBC: 6.3 10*3/uL (ref 4.0–10.5)

## 2014-06-11 LAB — MAGNESIUM: MAGNESIUM: 2.1 mg/dL (ref 1.5–2.5)

## 2014-06-11 LAB — GLUCOSE, CAPILLARY
Glucose-Capillary: 109 mg/dL — ABNORMAL HIGH (ref 70–99)
Glucose-Capillary: 125 mg/dL — ABNORMAL HIGH (ref 70–99)
Glucose-Capillary: 125 mg/dL — ABNORMAL HIGH (ref 70–99)
Glucose-Capillary: 152 mg/dL — ABNORMAL HIGH (ref 70–99)
Glucose-Capillary: 174 mg/dL — ABNORMAL HIGH (ref 70–99)
Glucose-Capillary: 192 mg/dL — ABNORMAL HIGH (ref 70–99)

## 2014-06-11 LAB — PHOSPHORUS: PHOSPHORUS: 2.2 mg/dL — AB (ref 2.3–4.6)

## 2014-06-11 LAB — HEPARIN LEVEL (UNFRACTIONATED): Heparin Unfractionated: <0.1 IU/mL — ABNORMAL LOW (ref 0.30–0.70)

## 2014-06-11 MED ORDER — TAMSULOSIN HCL 0.4 MG PO CAPS
0.4000 mg | ORAL_CAPSULE | Freq: Every day | ORAL | Status: DC
  Administered 2014-06-11 – 2014-06-19 (×9): 0.4 mg via ORAL
  Filled 2014-06-11 (×9): qty 1

## 2014-06-11 MED ORDER — INSULIN ASPART 100 UNIT/ML ~~LOC~~ SOLN
0.0000 [IU] | Freq: Three times a day (TID) | SUBCUTANEOUS | Status: DC
  Administered 2014-06-11: 1 [IU] via SUBCUTANEOUS
  Administered 2014-06-13: 2 [IU] via SUBCUTANEOUS
  Administered 2014-06-14: 1 [IU] via SUBCUTANEOUS
  Administered 2014-06-14: 2 [IU] via SUBCUTANEOUS
  Administered 2014-06-15 (×3): 1 [IU] via SUBCUTANEOUS
  Administered 2014-06-16 (×2): 2 [IU] via SUBCUTANEOUS
  Administered 2014-06-17: 1 [IU] via SUBCUTANEOUS
  Administered 2014-06-17: 2 [IU] via SUBCUTANEOUS
  Administered 2014-06-18: 5 [IU] via SUBCUTANEOUS
  Administered 2014-06-19: 1 [IU] via SUBCUTANEOUS

## 2014-06-11 MED ORDER — WHITE PETROLATUM GEL
Status: DC | PRN
  Administered 2014-06-11: 0.2 via TOPICAL
  Filled 2014-06-11 (×2): qty 28.35

## 2014-06-11 MED ORDER — FAMOTIDINE 40 MG/5ML PO SUSR
20.0000 mg | Freq: Two times a day (BID) | ORAL | Status: DC
  Administered 2014-06-11 – 2014-06-15 (×8): 20 mg
  Filled 2014-06-11 (×10): qty 2.5

## 2014-06-11 MED ORDER — HEPARIN SODIUM (PORCINE) 5000 UNIT/ML IJ SOLN
5000.0000 [IU] | Freq: Three times a day (TID) | INTRAMUSCULAR | Status: DC
  Administered 2014-06-11 – 2014-06-15 (×13): 5000 [IU] via SUBCUTANEOUS
  Filled 2014-06-11 (×18): qty 1

## 2014-06-11 MED ORDER — INSULIN ASPART 100 UNIT/ML ~~LOC~~ SOLN
0.0000 [IU] | Freq: Every day | SUBCUTANEOUS | Status: DC
  Administered 2014-06-18: 2 [IU] via SUBCUTANEOUS

## 2014-06-11 NOTE — ED Provider Notes (Signed)
CSN: [id number]     Arrival date & time 06/07/14  1347 History   First MD Initiated Contact with Patient 06/07/14 1347     Chief Complaint  Patient presents with  . Cardiac Arrest     (Consider location/radiation/quality/duration/timing/severity/associated sxs/prior Treatment) HPI Comments: PT comes in post cardiac arrest. [place] and [place]. CPR on scene. Per EMS, Firefighters arrives, pt was in Vfib arrest, and defibrillator shock x 3 discharged. EMS had a pulse. No epi given per then. In the ER, pt is mumbling, frothing, and has some purposeless movement. EKG shows LBBB - which is not new, and pt is Sgarbossa neg. PT is normotensive.  The history is provided by the EMS personnel.    Past Medical History  Diagnosis Date  . Diabetes mellitus without complication     type 2  . Hypertension   . Hypothyroidism   . Morbid obesity   . Dyslipidemia   . LBBB (left bundle branch block)   . History of nuclear stress test 04/2012    lexiscan - 2 day protocol; low risk study, evidence of inferrior & apical scar but no ischemia   . Arthritis   . Shortness of breath     with exertion   . Prostate enlargement    Past Surgical History  Procedure Laterality Date  . Mouth surgery  1963  . Finger surgery  1954  . Colonoscopy w/ biopsies    . Knee arthroscopy Left   . Total knee arthroplasty Right 02/05/2013    Dr Mayer Camel  . Total knee arthroplasty Right 02/05/2013    Procedure: TOTAL KNEE ARTHROPLASTY;  Surgeon: Kerin Salen, MD;  Location: [employer] [facility];  Service: Orthopedics;  Laterality: Right;  . Total knee arthroplasty Left 01/09/2014    DR Mayer Camel  . Total knee arthroplasty Left 01/19/2014    Procedure: LEFT TOTAL KNEE ARTHROPLASTY;  Surgeon: Kerin Salen, MD;  Location: [facility];  Service: Orthopedics;  Laterality: Left;   Family History  Problem Relation Age of Onset  . Cancer Mother   . Kidney disease Brother   . Hypertension Sister    History  Substance Use Topics   . Smoking status: Never Smoker   . Smokeless tobacco: Never Used  . Alcohol Use: No    Review of Systems  Unable to perform ROS: Patient unresponsive      Allergies  Bee venom; Codeine; Demerol; Pioglitazone; Shrimp; and Sulfa antibiotics  Home Medications   Prior to Admission medications   Medication Sig Start Date End Date Taking? Authorizing Provider  acetaminophen (TYLENOL) 500 MG tablet Take 1,000 mg by mouth every 6 (six) hours as needed (pain).    Yes Historical Provider, MD  [place]-Hydrochlorothiazide (TEKTURNA HCT) 300-25 MG TABS Take 1 tablet by mouth daily.   Yes Historical Provider, MD  amLODipine (NORVASC) 5 MG tablet Take 5 mg by mouth 2 (two) times daily.    Yes Historical Provider, MD  amoxicillin (AMOXIL) 500 MG capsule Take 2,000 mg by mouth as directed. Take 2 capsules 1 hour prior to dental procedures and 2 capsules 1 hour after   Yes Historical Provider, MD  aspirin EC 81 MG tablet Take 81 mg by mouth daily.   Yes Historical Provider, MD  celecoxib (CELEBREX) 200 MG capsule Take 200 mg by mouth daily.   Yes Historical Provider, MD  glimepiride (AMARYL) 4 MG tablet Take 8 mg by mouth daily before breakfast.    Yes Historical Provider, MD  ipratropium (  ATROVENT HFA) 17 MCG/ACT inhaler Inhale 2 puffs into the lungs every 6 (six) hours.   Yes Historical Provider, MD  levothyroxine (SYNTHROID, LEVOTHROID) 75 MCG tablet Take 75 mcg by mouth daily before breakfast.   Yes Historical Provider, MD  metFORMIN (GLUCOPHAGE-XR) 500 MG 24 hr tablet Take 1,000 mg by mouth daily with breakfast.   Yes Historical Provider, MD  oxyCODONE-acetaminophen (ROXICET) 5-325 MG per tablet Take 1 tablet by mouth every 4 (four) hours as needed. 01/19/14  Yes Leighton Parody, PA-C  rosuvastatin (CRESTOR) 10 MG tablet Take 10 mg by mouth daily.   Yes Historical Provider, MD  saxagliptin HCl (ONGLYZA) 5 MG TABS tablet Take 5 mg by mouth daily.   Yes Historical Provider, MD  tamsulosin  (FLOMAX) 0.4 MG CAPS capsule Take 1 capsule by mouth daily. 10/07/13  Yes Historical Provider, MD  aspirin EC 325 MG tablet Take 1 tablet (325 mg total) by mouth 2 (two) times daily. 01/19/14   Leighton Parody, PA-C  LUTEIN ESTERS PO Take 1 capsule by mouth every other day.     Historical Provider, MD   BP 97/52 mmHg  Pulse 113  Temp(Src) 99.8 F (37.7 C) (Oral)  Resp 21  Ht 6' (1.829 m)  Wt 307 lb 3.2 oz (139.345 kg)  BMI 41.65 kg/m2  SpO2 96% Physical Exam  Constitutional: He appears well-developed.  HENT:  Head: Atraumatic.  Eyes: Pupils are equal, round, and reactive to light.  3 mm  Cardiovascular: Normal rate.   Pulmonary/Chest:  Elevated respiratory rate  Abdominal: Soft.  Neurological:  comatose  Skin: Skin is warm.  Nursing note and vitals reviewed.   ED Course  Procedures (including critical care time) Labs Review Labs Reviewed  CBC WITH DIFFERENTIAL/PLATELET - Abnormal; Notable for the following:    RBC 3.50 (*)    Hemoglobin 10.5 (*)    HCT 32.9 (*)    All other components within normal limits  COMPREHENSIVE METABOLIC PANEL - Abnormal; Notable for the following:    Glucose, Bld 224 (*)    Calcium 7.8 (*)    Total Protein 5.7 (*)    Albumin 2.9 (*)    AST 450 (*)    ALT 637 (*)    GFR calc non Af Amer 62 (*)    GFR calc Af Amer 72 (*)    All other components within normal limits  PHOSPHORUS - Abnormal; Notable for the following:    Phosphorus 4.8 (*)    All other components within normal limits  URINALYSIS, ROUTINE W REFLEX MICROSCOPIC - Abnormal; Notable for the following:    APPearance CLOUDY (*)    Glucose, UA 250 (*)    Hgb urine dipstick LARGE (*)    Protein, ur >300 (*)    All other components within normal limits  PROTIME-INR - Abnormal; Notable for the following:    Prothrombin Time 15.6 (*)    All other components within normal limits  APTT - Abnormal; Notable for the following:    aPTT 64 (*)    All other components within normal  limits  URINE MICROSCOPIC-ADD ON - Abnormal; Notable for the following:    Casts GRANULAR CAST (*)    All other components within normal limits  CBC - Abnormal; Notable for the following:    RBC 3.67 (*)    Hemoglobin 11.1 (*)    HCT 34.1 (*)    All other components within normal limits  TSH - Abnormal; Notable for the following:  TSH 0.344 (*)    All other components within normal limits  GLUCOSE, CAPILLARY - Abnormal; Notable for the following:    Glucose-Capillary 126 (*)    All other components within normal limits  BASIC METABOLIC PANEL - Abnormal; Notable for the following:    Glucose, Bld 123 (*)    BUN 25 (*)    Calcium 8.2 (*)    GFR calc non Af Amer 58 (*)    GFR calc Af Amer 67 (*)    All other components within normal limits  APTT - Abnormal; Notable for the following:    aPTT 100 (*)    All other components within normal limits  GLUCOSE, CAPILLARY - Abnormal; Notable for the following:    Glucose-Capillary 104 (*)    All other components within normal limits  GLUCOSE, CAPILLARY - Abnormal; Notable for the following:    Glucose-Capillary 102 (*)    All other components within normal limits  GLUCOSE, CAPILLARY - Abnormal; Notable for the following:    Glucose-Capillary 113 (*)    All other components within normal limits  HEPARIN LEVEL (UNFRACTIONATED) - Abnormal; Notable for the following:    Heparin Unfractionated 0.74 (*)    All other components within normal limits  D-DIMER, QUANTITATIVE - Abnormal; Notable for the following:    D-Dimer, Quant 5.21 (*)    All other components within normal limits  GLUCOSE, CAPILLARY - Abnormal; Notable for the following:    Glucose-Capillary 107 (*)    All other components within normal limits  CBC - Abnormal; Notable for the following:    RBC 4.01 (*)    Hemoglobin 12.5 (*)    HCT 38.2 (*)    All other components within normal limits  BASIC METABOLIC PANEL - Abnormal; Notable for the following:    Glucose, Bld 113  (*)    BUN 33 (*)    GFR calc non Af Amer 58 (*)    GFR calc Af Amer 67 (*)    All other components within normal limits  PHOSPHORUS - Abnormal; Notable for the following:    Phosphorus 4.8 (*)    All other components within normal limits  BLOOD GAS, ARTERIAL - Abnormal; Notable for the following:    pCO2 arterial 47.8 (*)    pO2, Arterial 159.0 (*)    Bicarbonate 27.1 (*)    Acid-Base Excess 2.1 (*)    All other components within normal limits  GLUCOSE, CAPILLARY - Abnormal; Notable for the following:    Glucose-Capillary 117 (*)    All other components within normal limits  GLUCOSE, CAPILLARY - Abnormal; Notable for the following:    Glucose-Capillary 116 (*)    All other components within normal limits  HEPARIN LEVEL (UNFRACTIONATED) - Abnormal; Notable for the following:    Heparin Unfractionated 0.85 (*)    All other components within normal limits  GLUCOSE, CAPILLARY - Abnormal; Notable for the following:    Glucose-Capillary 103 (*)    All other components within normal limits  GLUCOSE, CAPILLARY - Abnormal; Notable for the following:    Glucose-Capillary 101 (*)    All other components within normal limits  CBC - Abnormal; Notable for the following:    RBC 4.02 (*)    Hemoglobin 12.3 (*)    HCT 38.0 (*)    All other components within normal limits  HEPARIN LEVEL (UNFRACTIONATED) - Abnormal; Notable for the following:    Heparin Unfractionated 0.17 (*)    All other components within  normal limits  BASIC METABOLIC PANEL - Abnormal; Notable for the following:    Sodium 146 (*)    Glucose, Bld 104 (*)    BUN 39 (*)    Creatinine, Ser 1.42 (*)    GFR calc non Af Amer 47 (*)    GFR calc Af Amer 55 (*)    All other components within normal limits  PHOSPHORUS - Abnormal; Notable for the following:    Phosphorus 4.8 (*)    All other components within normal limits  GLUCOSE, CAPILLARY - Abnormal; Notable for the following:    Glucose-Capillary 103 (*)    All other  components within normal limits  GLUCOSE, CAPILLARY - Abnormal; Notable for the following:    Glucose-Capillary 104 (*)    All other components within normal limits  HEPARIN LEVEL (UNFRACTIONATED) - Abnormal; Notable for the following:    Heparin Unfractionated 0.19 (*)    All other components within normal limits  GLUCOSE, CAPILLARY - Abnormal; Notable for the following:    Glucose-Capillary 170 (*)    All other components within normal limits  HEPARIN LEVEL (UNFRACTIONATED) - Abnormal; Notable for the following:    Heparin Unfractionated <0.10 (*)    All other components within normal limits  GLUCOSE, CAPILLARY - Abnormal; Notable for the following:    Glucose-Capillary 123 (*)    All other components within normal limits  CBC - Abnormal; Notable for the following:    RBC 3.61 (*)    Hemoglobin 11.0 (*)    HCT 34.4 (*)    All other components within normal limits  BASIC METABOLIC PANEL - Abnormal; Notable for the following:    Glucose, Bld 129 (*)    BUN 39 (*)    Creatinine, Ser 1.44 (*)    GFR calc non Af Amer 47 (*)    GFR calc Af Amer 54 (*)    All other components within normal limits  BLOOD GAS, ARTERIAL - Abnormal; Notable for the following:    pO2, Arterial 63.7 (*)    Bicarbonate 28.8 (*)    Acid-Base Excess 4.8 (*)    All other components within normal limits  PHOSPHORUS - Abnormal; Notable for the following:    Phosphorus 2.2 (*)    All other components within normal limits  GLUCOSE, CAPILLARY - Abnormal; Notable for the following:    Glucose-Capillary 108 (*)    All other components within normal limits  GLUCOSE, CAPILLARY - Abnormal; Notable for the following:    Glucose-Capillary 174 (*)    All other components within normal limits  GLUCOSE, CAPILLARY - Abnormal; Notable for the following:    Glucose-Capillary 125 (*)    All other components within normal limits  GLUCOSE, CAPILLARY - Abnormal; Notable for the following:    Glucose-Capillary 152 (*)     All other components within normal limits  GLUCOSE, CAPILLARY - Abnormal; Notable for the following:    Glucose-Capillary 109 (*)    All other components within normal limits  I-STAT CG4 LACTIC ACID, ED - Abnormal; Notable for the following:    Lactic Acid, Venous 3.24 (*)    All other components within normal limits  I-STAT CHEM 8, ED - Abnormal; Notable for the following:    Sodium 146 (*)    Potassium 3.4 (*)    BUN 25 (*)    Glucose, Bld 227 (*)    Hemoglobin 11.6 (*)    HCT 34.0 (*)    All other components within normal limits  CBG MONITORING, ED - Abnormal; Notable for the following:    Glucose-Capillary 228 (*)    All other components within normal limits  I-STAT ARTERIAL BLOOD GAS, ED - Abnormal; Notable for the following:    pH, Arterial 7.284 (*)    pCO2 arterial 55.4 (*)    pO2, Arterial 263.0 (*)    Bicarbonate 26.7 (*)    All other components within normal limits  MRSA PCR SCREENING  MAGNESIUM  TROPONIN I  HEPARIN LEVEL (UNFRACTIONATED)  PROTIME-INR  APTT  HEPARIN LEVEL (UNFRACTIONATED)  PROTIME-INR  GLUCOSE, CAPILLARY  GLUCOSE, CAPILLARY  MAGNESIUM  GLUCOSE, CAPILLARY  HEPARIN LEVEL (UNFRACTIONATED)  GLUCOSE, CAPILLARY  TRIGLYCERIDES  MAGNESIUM  GLUCOSE, CAPILLARY  GLUCOSE, CAPILLARY  MAGNESIUM  I-STAT TROPOININ, ED    Imaging Review Nm Pulmonary Perf And Vent  06/10/2014   CLINICAL DATA:  Difficulty breathing  EXAM: NUCLEAR MEDICINE VENTILATION - PERFUSION LUNG SCAN  Views: Anterior, posterior, RPO, LPO, RAO, LAO -ventilation and perfusion. Lateral images could not be obtained due to patient's size.  Radionuclide: Technetium 44m DTPA -ventilation; tTechnetium 24m macroaggregated albumin-perfusion  Dose:  40.0 mCi-ventilation; 6.0 mCi-perfusion  Route of administration: Inhalation -ventilation ; intravenous- perfusion  COMPARISON:  Chest radiograph April 10, 2014  FINDINGS: Ventilation: There is cardiomegaly. There are a few subsegmental ventilation  defects. There is no segmental level ventilation defect.  Perfusion: There is cardiomegaly. There are a few small subsegmental perfusion defects which match ventilation defects. There is no segmental perfusion defect. There is no significant ventilation/perfusion mismatch.  IMPRESSION: Small ventilation and perfusion defects without appreciable ventilation/ perfusion mismatch. Cardiomegaly. This study constitutes a low probability of pulmonary embolus.   Electronically Signed   By: Lowella Grip III M.D.   On: 06/10/2014 16:36     EKG Interpretation None       Date: 06/11/2014  Rate: 60  Rhythm: sinus rhythm, LBBB  QRS Axis: left axis deviation  Intervals: normal  ST/T Wave abnormalities: non specific ST and T wave changes  Conduction Disutrbances: LBBB  Narrative Interpretation: unremarkable      MDM   Final diagnoses:  Cardiac arrest  Encounter for intubation    CRITICAL CARE Performed by: Varney Biles   Total critical care time: 40 min  Critical care time was exclusive of separately billable procedures and treating other patients.  Critical care was necessary to treat or prevent imminent or life-threatening deterioration.  Critical care was time spent personally by me on the following activities: development of treatment plan with patient and/or surrogate as well as nursing, discussions with consultants, evaluation of patient's response to treatment, examination of patient, obtaining history from patient or surrogate, ordering and performing treatments and interventions, ordering and review of laboratory studies, ordering and review of radiographic studies, pulse oximetry and re-evaluation of patient's condition.   INTUBATION Performed by: Varney Biles  Required items: required blood products, implants, devices, and special equipment available Patient identity confirmed: provided demographic data and hospital-assigned identification number Time out:  Immediately prior to procedure a "time out" was called to verify the correct patient, procedure, equipment, support staff and site/side marked as required.  Indications: Airway compromise Intubation method: Glidescope Laryngoscopy   Preoxygenation: BVM  Sedatives: 10 mg etomidate Paralytic: 100mg  succinylcholine  Tube Size: 7.5 cuffed Post-procedure assessment: chest rise and ETCO2 monitor Breath sounds: equal and absent over the epigastrium Tube secured with: ETT holder Chest x-ray interpreted by radiologist and me.  Chest x-ray findings: endotracheal tube in appropriate position  Patient tolerated  the procedure well with no immediate complications.   Pt comes in post arrest. Witnessed collapsed. No CPR needs in the ER. PT has good iv access. Intubated for airway protection. Pt is hemodynamically stable. Cardiology consulted for their input. No criteria for CODE STEMI activation. Critical Care consulted for admission and for code hypothermia.     Varney Biles, MD 06/11/14 1302

## 2014-06-11 NOTE — Progress Notes (Signed)
PROGRESS NOTE  Rickey Rice LFY:[id number] DOB: November 16, 1940 DOA: 06/07/2014 PCP: Elyn Peers, MD  HPI/Recap of past 24 hours: Patient is a 74 year old male with past medical history of BPH, diabetes mellitus with stage II chronic kidney disease and central hypertension who was admitted on 3/13 after suffering a cardiac arrest initially with pulseless electrical activity and then ventricular fibrillation. Patient was able to be resuscitated and was awakened emergency room, but then intubated after he removed his airway. Over the next few days, patient able to be weaned off of pressor support and extubated. His mentation continues to improve. Cardiology consulted and echocardiogram noted diffuse hypokinesis with ejection fraction of 40%. DVT ruled out. Patient's CT angiogram noted no evidence of PE but was poor quality. A VQ scan was found to have low probability. Cardiology plans to take patient for cardiac catheterization on 3/18. Diuretics have been held given mild acute on chronic renal failure with plans for upcoming catheterization.  Patient today is doing okay. He has no complaints. He is able to interact with me appropriately and is alert. States his breathing is doing much better. Denies any chest pain.  Assessment/Plan: Active Problems:    Anoxic encephalopathy: Patient's mentation continues to improve    Cardiac arrest with PEA: For planned cardiac catheterization tomorrow    Acute respiratory failure with hypoxemia: Secondary to acute systolic congestive heart failure. Much improved and breathing more comfortably    Congestive dilated cardiomyopathy, holding diuretics for now    Essential hypertension: Continue antihypertensives      acute kidney insult in the setting of CKD (chronic kidney disease) stage 2, GFR 60-89 ml/min from diabetes mellitus: Holding on diuretics for now. Recheck labs in the morning.   diabetes mellitus with renal disease: Checking A1c, sliding scale only  for now, holding oral medications Morbid obesity: Patient is criteria with BMI greater than 40    Code Status: full code   Family Communication: left message with wife   Disposition Plan: Cardiac catheterization tomorrow which will determine further plans    Consultants:  Critical care  Cardiology   Procedures:   Cardiac catheterization plan 3/18  intubation 3/13-3/15   echocardiogram done 3/13: Left bundle branch block, decreased ejection fraction to 40%  Lower extremity Dopplers done 3/14: No evidence of DVT  Antibiotics:   None   Objective: BP 108/63 mmHg  Pulse 87  Temp(Src) 100 F (37.8 C) (Oral)  Resp 20  Ht 6' (1.829 m)  Wt 139.345 kg (307 lb 3.2 oz)  BMI 41.65 kg/m2  SpO2 99%  Intake/Output Summary (Last 24 hours) at 06/11/14 1559 Last data filed at 06/11/14 1245  Gross per 24 hour  Intake 768.84 ml  Output   1275 ml  Net -506.16 ml   Filed Weights   06/09/14 0500 06/10/14 0500 06/11/14 0352  Weight: 151.56 kg (334 lb 2.1 oz) 144.06 kg (317 lb 9.5 oz) 139.345 kg (307 lb 3.2 oz)    Exam:   General:  Alert and oriented 3, no acute distress  Cardiovascular: regular rate and rhythm, E5-I7, 2/6 systolic ejection murmur  Respiratory: decreased breath sounds bibasilar  Abdomen: soft, nontender, nondistended, positive bowel sounds  Musculoskeletal: no clubbing or cyanosis, 2+ pitting edema from the knees down   Data Reviewed: Basic Metabolic Panel:  Recent Labs Lab 06/07/14 1350 06/07/14 1357 06/08/14 0234 06/09/14 0535 06/10/14 0400 06/11/14 0521  NA 142 146* 140 145 146* 144  K 3.5 3.4* 3.9 4.3 3.5 3.7  CL 109  103 106 105 107 106  CO2 23  --  29 28 27 28   GLUCOSE 224* 227* 123* 113* 104* 129*  BUN 22 25* 25* 33* 39* 39*  CREATININE 1.14 1.10 1.20 1.21 1.42* 1.44*  CALCIUM 7.8*  --  8.2* 8.7 9.0 9.0  MG 1.7  --   --  2.1 1.9 2.1  PHOS 4.8*  --   --  4.8* 4.8* 2.2*   Liver Function Tests:  Recent Labs Lab 06/07/14 1350    AST 450*  ALT 637*  ALKPHOS 98  BILITOT 0.4  PROT 5.7*  ALBUMIN 2.9*   No results for input(s): LIPASE, AMYLASE in the last 168 hours. No results for input(s): AMMONIA in the last 168 hours. CBC:  Recent Labs Lab 06/07/14 1350 06/07/14 1357 06/08/14 0234 06/09/14 0535 06/10/14 0400 06/11/14 0521  WBC 9.2  --  5.5 7.4 7.3 6.3  NEUTROABS 5.6  --   --   --   --   --   HGB 10.5* 11.6* 11.1* 12.5* 12.3* 11.0*  HCT 32.9* 34.0* 34.1* 38.2* 38.0* 34.4*  MCV 94.0  --  92.9 95.3 94.5 95.3  PLT 164  --  154 180 188 175   Cardiac Enzymes:    Recent Labs Lab 06/07/14 1350  TROPONINI <0.03   BNP (last 3 results) No results for input(s): BNP in the last 8760 hours.  ProBNP (last 3 results) No results for input(s): PROBNP in the last 8760 hours.  CBG:  Recent Labs Lab 06/10/14 2000 06/11/14 0011 06/11/14 0349 06/11/14 0822 06/11/14 1138  GLUCAP 108* 174* 125* 109* 152*    Recent Results (from the past 240 hour(s))  MRSA PCR Screening     Status: None   Collection Time: 06/07/14  5:09 PM  Result Value Ref Range Status   MRSA by PCR NEGATIVE NEGATIVE Final    Comment:        The GeneXpert MRSA Assay (FDA approved for NASAL specimens only), is one component of a comprehensive MRSA colonization surveillance program. It is not intended to diagnose MRSA infection nor to guide or monitor treatment for MRSA infections.      Studies: Nm Pulmonary Perf And Vent  06/10/2014   CLINICAL DATA:  Difficulty breathing  EXAM: NUCLEAR MEDICINE VENTILATION - PERFUSION LUNG SCAN  Views: Anterior, posterior, RPO, LPO, RAO, LAO -ventilation and perfusion. Lateral images could not be obtained due to patient's size.  Radionuclide: Technetium 35m DTPA -ventilation; tTechnetium 34m macroaggregated albumin-perfusion  Dose:  40.0 mCi-ventilation; 6.0 mCi-perfusion  Route of administration: Inhalation -ventilation ; intravenous- perfusion  COMPARISON:  Chest radiograph April 10, 2014   FINDINGS: Ventilation: There is cardiomegaly. There are a few subsegmental ventilation defects. There is no segmental level ventilation defect.  Perfusion: There is cardiomegaly. There are a few small subsegmental perfusion defects which match ventilation defects. There is no segmental perfusion defect. There is no significant ventilation/perfusion mismatch.  IMPRESSION: Small ventilation and perfusion defects without appreciable ventilation/ perfusion mismatch. Cardiomegaly. This study constitutes a low probability of pulmonary embolus.   Electronically Signed   By: Lowella Grip III M.D.   On: 06/10/2014 16:36    Scheduled Meds: . amLODipine  10 mg Oral Daily  . antiseptic oral rinse  7 mL Mouth Rinse BID  . aspirin  325 mg Per Tube Daily  . famotidine  20 mg Per Tube BID  . heparin subcutaneous  5,000 Units Subcutaneous 3 times per day  . insulin aspart  0-20 Units  Subcutaneous 6 times per day  . levothyroxine  75 mcg Per Tube Daily  . rosuvastatin  10 mg Per Tube q1800     Continuous Infusions: . sodium chloride 10 mL/hr at 06/10/14 2301     Time spent: 25 minutes  [facility] Pager [phone]. If 7PM-7AM, please contact night-coverage at [email or web address], password [facility] 06/11/2014, 3:59 PM  LOS: 4 days

## 2014-06-11 NOTE — Progress Notes (Signed)
Speech Language Pathology Treatment: Dysphagia  Patient Details Name: Rickey Rice MRN: [id number] DOB: [birth year] Today's Date: 06/11/2014 Time: 5686-1683 SLP Time Calculation (min) (ACUTE ONLY): 10 min  Assessment / Plan / Recommendation Clinical Impression  Follow-up for acute reversible dysphagia. PO trials with thin liquid did not elicit s/s of aspiration;clear vocal vocal quality. Recommend pt initiate regular/ thin liquid diet. Speech will sign off.   HPI HPI: 50 M with obesity, prior bilateral knee surgeries, chronic LBBB, mild cardiomyopathy suffered cardiac arrest while attending church. Initial rhythm was shockable via AED then PEA. Subsequently VF. ROSC in approx 15 mins. Was purposeful in ED but required intubation after he removed [facility] airway. Intubated from 3/13  to 3/15.   Pertinent Vitals    SLP Plan  Continue with current plan of care    Recommendations Diet recommendations: Regular;Thin liquid Liquids provided via: Cup;Straw Medication Administration: Whole meds with puree Supervision: Patient able to self feed Compensations: Slow rate;Small sips/bites Postural Changes and/or Swallow Maneuvers: Seated upright 90 degrees              Oral Care Recommendations: Oral care BID Plan: Continue with current plan of care    GO     Bermudez-Bosch, Merilynn Haydu 06/11/2014, 1:32 PM

## 2014-06-11 NOTE — Progress Notes (Signed)
Patient Name: Rickey Rice Date of Encounter: 06/11/2014  Active Problems:   PEA (Pulseless electrical activity)   Anoxic encephalopathy   Cardiac arrest   Acute respiratory failure with hypoxemia   Congestive dilated cardiomyopathy   Essential hypertension   Primary Cardiologist:   Patient Profile: 74 yo with no documented CAD. Chronic LBBB, nl MV pre knee surgery in October 2015 with LVEF 49%. S/P bilateral TKR's. Hx Morbid obesity, elevated lipids and HTN. [place] at [place]--> PEA--> V fib x 3 shocks. S/p cooling protocol, rewarmed, extubated on 06/08/2014. Elevated d-dimer, chest CT poor, VQ low prob PE. Neg trop, EF 40% w/ diffuse HK on echo. Cath planned once Cr improves.  SUBJECTIVE: No chest pain, no SOB, has problems w/ LE edema and varicose veins as OP.  OBJECTIVE Filed Vitals:   06/10/14 1853 06/10/14 2014 06/11/14 0352 06/11/14 0932  BP: 99/46 127/60 97/52   Pulse: 87 89 82 113  Temp: 99.2 F (37.3 C) 100.4 F (38 C) 99.8 F (37.7 C)   TempSrc: Oral Oral Oral   Resp: 20 19 21    Height:      Weight:   307 lb 3.2 oz (139.345 kg)   SpO2: 98% 94% 96%     Intake/Output Summary (Last 24 hours) at 06/11/14 1002 Last data filed at 06/11/14 0449  Gross per 24 hour  Intake 840.84 ml  Output   1435 ml  Net -594.16 ml   Filed Weights   06/09/14 0500 06/10/14 0500 06/11/14 0352  Weight: 334 lb 2.1 oz (151.56 kg) 317 lb 9.5 oz (144.06 kg) 307 lb 3.2 oz (139.345 kg)    PHYSICAL EXAM General: Well developed, well nourished, male in no acute distress. Head: Normocephalic, atraumatic.  Neck: Supple without bruits, JVD not elevated. Lungs:  Resp regular and unlabored, CTA. Heart: RRR, S1, S2, no S3, S4, or murmur; no rub. Abdomen: Soft, non-tender, non-distended, BS + x 4.  Extremities: No clubbing, cyanosis, no edema. Varicose veins seen, but not enlarged Neuro: Alert and oriented X 3. Moves all extremities spontaneously. Psych: Normal  affect.  LABS: CBC: Recent Labs  06/10/14 0400 06/11/14 0521  WBC 7.3 6.3  HGB 12.3* 11.0*  HCT 38.0* 34.4*  MCV 94.5 95.3  PLT 188 779   Basic Metabolic Panel: Recent Labs  06/10/14 0400 06/11/14 0521  NA 146* 144  K 3.5 3.7  CL 107 106  CO2 27 28  GLUCOSE 104* 129*  BUN 39* 39*  CREATININE 1.42* 1.44*  CALCIUM 9.0 9.0  MG 1.9 2.1  PHOS 4.8* 2.2*   D-dimer: Recent Labs  06/08/14 1030  DDIMER 5.21*   Fasting Lipid Panel: Recent Labs  06/10/14 0400  TRIG 68   TELE: SR, no sig ectopy    Radiology/Studies: Nm Pulmonary Perf And Vent 06/10/2014   CLINICAL DATA:  Difficulty breathing  EXAM: NUCLEAR MEDICINE VENTILATION - PERFUSION LUNG SCAN  Views: Anterior, posterior, RPO, LPO, RAO, LAO -ventilation and perfusion. Lateral images could not be obtained due to patient's size.  Radionuclide: Technetium 43m DTPA -ventilation; tTechnetium 15m macroaggregated albumin-perfusion  Dose:  40.0 mCi-ventilation; 6.0 mCi-perfusion  Route of administration: Inhalation -ventilation ; intravenous- perfusion  COMPARISON:  Chest radiograph April 10, 2014  FINDINGS: Ventilation: There is cardiomegaly. There are a few subsegmental ventilation defects. There is no segmental level ventilation defect.  Perfusion: There is cardiomegaly. There are a few small subsegmental perfusion defects which match ventilation defects. There is no segmental perfusion defect. There is  no significant ventilation/perfusion mismatch.  IMPRESSION: Small ventilation and perfusion defects without appreciable ventilation/ perfusion mismatch. Cardiomegaly. This study constitutes a low probability of pulmonary embolus.   Electronically Signed   By: Lowella Grip III M.D.   On: 06/10/2014 16:36     Current Medications:  . amLODipine  10 mg Oral Daily  . antiseptic oral rinse  7 mL Mouth Rinse BID  . aspirin  325 mg Per Tube Daily  . famotidine (PEPCID) IV  20 mg Intravenous Q12H  . heparin subcutaneous  5,000  Units Subcutaneous 3 times per day  . insulin aspart  0-20 Units Subcutaneous 6 times per day  . levothyroxine  75 mcg Per Tube Daily  . rosuvastatin  10 mg Per Tube q1800   . sodium chloride 10 mL/hr at 06/10/14 2301    ASSESSMENT AND PLAN: Active Problems:   PEA (Pulseless electrical activity) - cause unclear    Anoxic encephalopathy - follow, ?advise no driving for now    Cardiac arrest - cause unclear, for cath when renal function OK - hydrate gently    Acute respiratory failure with hypoxemia - improved - per IM/CCM    Congestive dilated cardiomyopathy - CVP was 18 on admission, after BP/HR stabilized - EF 40% w/ no WMA - Add ACE or BiDil after cath for low ER    Essential hypertension - SBP 90s-120s last 24 hours, HR generally > 75 - on amlodipine 10 mg since 03/15 - MD advise on decreasing dose to add a BB +/- ACE/BiDil  Jonetta Speak , PA-C 10:02 AM 06/11/2014

## 2014-06-11 NOTE — Progress Notes (Signed)
Physical Therapy Treatment Patient Details Name: Rickey Rice MRN: [id number] DOB: 1940/05/15 Today's Date: June 14, 2014    History of Present Illness Pt adm with cardiac arrest. Extubated 3/15. PMH - Bil TKA, obesity, DM, HTN, LBBB    PT Comments    Pt moving well today with decreased need for assist in all aspects and increased ambulation distance. Pt encouraged to be up throughout the day with nursing and continue HEP. No SoB or CP with activity with HR 92-113 with activity. Will continue to follow.  Follow Up Recommendations  Home health PT     Equipment Recommendations  None recommended by PT    Recommendations for Other Services       Precautions / Restrictions Precautions Precautions: Fall    Mobility  Bed Mobility Overal bed mobility: Needs Assistance Bed Mobility: Supine to Sit     Supine to sit: Min assist     General bed mobility comments: cues for sequence with use of rail and min assist to elevate trunk from surface  Transfers Overall transfer level: Needs assistance   Transfers: Sit to/from Stand Sit to Stand: Min guard         General transfer comment: cues for hand placement  Ambulation/Gait Ambulation/Gait assistance: Supervision Ambulation Distance (Feet): 400 Feet Assistive device: Rolling walker (2 wheeled) Gait Pattern/deviations: Step-through pattern;Trunk flexed;Decreased stride length   Gait velocity interpretation: Below normal speed for age/gender General Gait Details: cues for posture    Stairs            Wheelchair Mobility    Modified Rankin (Stroke Patients Only)       Balance                                    Cognition Arousal/Alertness: Awake/alert Behavior During Therapy: WFL for tasks assessed/performed Overall Cognitive Status: Within Functional Limits for tasks assessed                      Exercises General Exercises - Lower Extremity Short Arc Quad: AROM;Seated;Both;15  reps Hip Flexion/Marching: AROM;Seated;Both;15 reps    General Comments        Pertinent Vitals/Pain Pain Assessment: No/denies pain    Home Living                      Prior Function            PT Goals (current goals can now be found in the care plan section) Progress towards PT goals: Progressing toward goals    Frequency       PT Plan Current plan remains appropriate    Co-evaluation             End of Session   Activity Tolerance: Patient tolerated treatment well Patient left: in chair;with call bell/phone within reach;with family/visitor present     Time: 0901-0930 PT Time Calculation (min) (ACUTE ONLY): 29 min  Charges:  $Gait Training: 8-22 mins $Therapeutic Activity: 8-22 mins                    G Codes:      [name] 06/14/14, 9:34 AM [name], [place]

## 2014-06-12 ENCOUNTER — Encounter (HOSPITAL_COMMUNITY): Payer: Self-pay | Admitting: Cardiology

## 2014-06-12 ENCOUNTER — Encounter (HOSPITAL_COMMUNITY)
Admission: EM | Disposition: A | Discharge status: Home / Self Care | Payer: Self-pay | Source: Home / Self Care | Attending: Internal Medicine

## 2014-06-12 DIAGNOSIS — N179 Acute kidney failure, unspecified: Secondary | ICD-10-CM

## 2014-06-12 DIAGNOSIS — I2510 Atherosclerotic heart disease of native coronary artery without angina pectoris: Secondary | ICD-10-CM

## 2014-06-12 HISTORY — PX: LEFT HEART CATHETERIZATION WITH CORONARY ANGIOGRAM: SHX5451

## 2014-06-12 LAB — GLUCOSE, CAPILLARY
GLUCOSE-CAPILLARY: 106 mg/dL — AB (ref 70–99)
Glucose-Capillary: 100 mg/dL — ABNORMAL HIGH (ref 70–99)
Glucose-Capillary: 114 mg/dL — ABNORMAL HIGH (ref 70–99)
Glucose-Capillary: 130 mg/dL — ABNORMAL HIGH (ref 70–99)
Glucose-Capillary: 143 mg/dL — ABNORMAL HIGH (ref 70–99)
Glucose-Capillary: 157 mg/dL — ABNORMAL HIGH (ref 70–99)

## 2014-06-12 LAB — CBC
HCT: 30.1 % — ABNORMAL LOW (ref 39.0–52.0)
Hemoglobin: 9.5 g/dL — ABNORMAL LOW (ref 13.0–17.0)
MCH: 29.8 pg (ref 26.0–34.0)
MCHC: 31.6 g/dL (ref 30.0–36.0)
MCV: 94.4 fL (ref 78.0–100.0)
Platelets: 153 10*3/uL (ref 150–400)
RBC: 3.19 MIL/uL — ABNORMAL LOW (ref 4.22–5.81)
RDW: 14.4 % (ref 11.5–15.5)
WBC: 7.6 10*3/uL (ref 4.0–10.5)

## 2014-06-12 LAB — BASIC METABOLIC PANEL
Anion gap: 5 (ref 5–15)
BUN: 28 mg/dL — AB (ref 6–23)
CO2: 31 mmol/L (ref 19–32)
CREATININE: 1.28 mg/dL (ref 0.50–1.35)
Calcium: 8.6 mg/dL (ref 8.4–10.5)
Chloride: 108 mmol/L (ref 96–112)
GFR, EST AFRICAN AMERICAN: 62 mL/min — AB (ref >90)
GFR, EST NON AFRICAN AMERICAN: 54 mL/min — AB (ref >90)
GLUCOSE: 130 mg/dL — AB (ref 70–99)
Potassium: 3.7 mmol/L (ref 3.5–5.1)
Sodium: 144 mmol/L (ref 135–145)

## 2014-06-12 LAB — T4, FREE: FREE T4: 1.13 ng/dL (ref 0.80–1.80)

## 2014-06-12 LAB — PROTIME-INR
INR: 1.17 (ref 0.00–1.49)
PROTHROMBIN TIME: 15 s (ref 11.6–15.2)

## 2014-06-12 SURGERY — LEFT HEART CATHETERIZATION WITH CORONARY ANGIOGRAM
Anesthesia: LOCAL

## 2014-06-12 MED ORDER — LIDOCAINE HCL (PF) 1 % IJ SOLN
INTRAMUSCULAR | Status: AC
  Filled 2014-06-12: qty 30

## 2014-06-12 MED ORDER — SODIUM CHLORIDE 0.9 % IV SOLN
INTRAVENOUS | Status: DC
  Administered 2014-06-12: 50 mL/h via INTRAVENOUS

## 2014-06-12 MED ORDER — MIDAZOLAM HCL 2 MG/2ML IJ SOLN
INTRAMUSCULAR | Status: AC
  Filled 2014-06-12: qty 2

## 2014-06-12 MED ORDER — SODIUM CHLORIDE 0.9 % IJ SOLN: 3.0000 mL | INTRAMUSCULAR | Status: DC | PRN

## 2014-06-12 MED ORDER — HEPARIN (PORCINE) IN NACL 2-0.9 UNIT/ML-% IJ SOLN
INTRAMUSCULAR | Status: AC
Start: 2014-06-12 — End: 2014-06-12
  Filled 2014-06-12: qty 1000

## 2014-06-12 MED ORDER — SODIUM CHLORIDE 0.9 % IV SOLN
1.0000 mL/kg/h | INTRAVENOUS | Status: AC
  Administered 2014-06-12: 1 mL/kg/h via INTRAVENOUS

## 2014-06-12 MED ORDER — NITROGLYCERIN 1 MG/10 ML FOR IR/CATH LAB
INTRA_ARTERIAL | Status: AC
  Filled 2014-06-12: qty 10

## 2014-06-12 MED ORDER — ASPIRIN 81 MG PO CHEW
81.0000 mg | CHEWABLE_TABLET | ORAL | Status: AC
  Administered 2014-06-12: 81 mg via ORAL
  Filled 2014-06-12: qty 1

## 2014-06-12 MED ORDER — HEPARIN SODIUM (PORCINE) 1000 UNIT/ML IJ SOLN
INTRAMUSCULAR | Status: AC
  Filled 2014-06-12: qty 1

## 2014-06-12 MED ORDER — SODIUM CHLORIDE 0.9 % IV SOLN: 250.0000 mL | INTRAVENOUS | Status: DC | PRN

## 2014-06-12 MED ORDER — FENTANYL CITRATE 0.05 MG/ML IJ SOLN
INTRAMUSCULAR | Status: AC
  Filled 2014-06-12: qty 2

## 2014-06-12 MED ORDER — SODIUM CHLORIDE 0.9 % IJ SOLN: 3.0000 mL | Freq: Two times a day (BID) | INTRAMUSCULAR | Status: DC

## 2014-06-12 MED ORDER — VERAPAMIL HCL 2.5 MG/ML IV SOLN
INTRAVENOUS | Status: AC
  Filled 2014-06-12: qty 2

## 2014-06-12 NOTE — Progress Notes (Signed)
Patient prepped and ready for Cath lab.  Bryan from Cath lab transported patient in bed. 2 IV access and TLC in place.  Consent in chart. Family at bedside and aware of procedure. Patient alert and oriented, vitals stable and eager to have procedure done.

## 2014-06-12 NOTE — Progress Notes (Signed)
Medicare Important Message given? YES  (If response is "NO", the following Medicare IM given date fields will be blank)  Date Medicare IM given: 06/12/14 Medicare IM given by:  Brylan Seubert  

## 2014-06-12 NOTE — CV Procedure (Signed)
CARDIAC CATHETERIZATION REPORT  NAME:  Rickey Rice   MRN: [id number] DOB:  09/01/1940   ADMIT DATE: 06/07/2014 Procedure Date: 06/12/2014  INTERVENTIONAL CARDIOLOGIST: Leonie Man, M.D., MS PRIMARY CARE PROVIDER: Elyn Peers, MD PRIMARY CARDIOLOGIST: Kenneth C (Mali) Debara Pickett, M.D.  PATIENT:  Rickey Rice is a 74 y.o. male past history of obesity, chronic left bundle branch block and mild cardiomyopathy who suffered cardiac arrest (considered to be PEA arrest) while attending church on March 13.  He has been stabilized following cooling protocol and has been extubated.  He has been diuresed, almost the point of perhaps overdiuresis. Lasix was held and he was gently hydrated for planned diagnostic cardiac catheterization today to exclude an ischemic etiology for his cardiac arrest.  PRE-OPERATIVE DIAGNOSIS:    PEA arrest  Moderate cardiomyopathy with EF by echocardiogram of 40% and global hypokinesis.  PROCEDURES PERFORMED:    Left Heart Catheterization with Native Coronary Angiography  via Right Radial Artery   Left Ventriculography  PROCEDURE: The patient was brought to the [facility] in the fasting state and prepped and draped in the usual sterile fashion for Right Radial artery access. A modified Allen's test was performed on the right wrist demonstrating excellent collateral flow for radial access.   Sterile technique was used including antiseptics, cap, gloves, gown, hand hygiene, mask and sheet. Skin prep: Chlorhexidine.   Consent: Risks of procedure as well as the alternatives and risks of each were explained to the (patient/caregiver). Consent for procedure obtained.   Time Out: Verified patient identification, verified procedure, site/side was marked, verified correct patient position, special equipment/implants available, medications/allergies/relevent history reviewed, required imaging and test results available.  Performed.  Access:   Right Radial Artery: 6 Fr Sheath -  Seldinger Technique (Angiocath Micropuncture Kit)  Radial Cocktail - 10 mL; IV Heparin 6000 Units   Left Heart Catheterization: 5Fr Catheters advanced or exchanged over a long exchange safety J-wire under direct fluoroscopic guidance into the ascending aorta.; TIG 4.0 catheter advanced first.  Right Coronary Artery Cineangiography: TIG 4.0 Catheter   LV Hemodynamics (LV Gram): TIG 4.0 Catheter   Left Coronary Artery Cineangiography: JL4 Catheter   Catheters removed over wire. Sheath removed in the cardiac catheterization lab with TR band placement for hemostasis.  TR Band: 1725  Hours; 14 mL air  FINDINGS:  Hemodynamics:   Central Aortic Pressure / Mean: 100/59/76 mmHg  Left Ventricular Pressure / LVEDP: 98/3/12 mmHg  Left Ventriculography:  EF: Roughly 40 %  Wall Motion: Mobile hypokinesis  Coronary Anatomy: Large draping vessels consistent with nonischemic retinopathy  Dominance: Right  Left Main: Large-caliber vessel bifurcates into the LAD And Circumflex.  Angiographically normal. LAD: Large-caliber vessel that bifurcates proximally into a large trifurcating first diagonal branch and the equal sized follow on LAD that has one more diagonal branch before it reaches down around the apex perfusing the distal inferolateral apex. Diffuse mild disease throughout but nothing more than 30%.  D1: Very large caliber trifurcating vessel. The superior branch is also bifurcates and has some moderate disease. The rest of the diagonal branch does not have any significant lesions in covers the entire anterolateral/lateral wall.  D2: All caliber vessel from the mid LAD. Angiographically normal.  Left Circumflex: Moderate caliber, nondominant vessel that courses as a new circumflex giving rise to a small posterior lateral branches very small proximal OM.   RCA: Normal caliber, dominant vessel with a high bifurcation. There a  branch that  appears to be an RV marginal branch but ends of coursing as a posterior lateral branch and the follow on RCA that continues to be the PDA and a small posterolateral system arising from it as well. Diffuse mild luminal irregularities but nothing significant.   MEDICATIONS:  Anesthesia:  Local Lidocaine 2 ml  Sedation:  2 mg IV Versed, 50 mcg IV fentanyl  Omnipaque Contrast: 85 ml  Anticoagulation:  IV Heparin 6000 Units Radial Cocktail: 5 mg Verapamil, 400 mcg NTG, 2 ml 2% Lidocaine in 10 ml NS  PATIENT DISPOSITION:    The patient was transferred to the PACU holding area in a hemodynamicaly stable, chest pain free condition.  The patient tolerated the procedure well, and there were no complications.  EBL:   < 10 ml  The patient was stable before, during, and after the procedure.  POST-OPERATIVE DIAGNOSIS:    Angiographically only mild to moderate disease in a right dominant system.  Mildly elevated LVEDP of 15 mmHg.  Likely nonischemic cardiomyopathy  Would suspect a nonischemic etiology for PE arrest  PLAN OF CARE:  Standard post radial cath care  Further workup per primary services.    Leonie Man, M.D., M.S. Interventional Cardiologist   Pager # [phone]

## 2014-06-12 NOTE — Progress Notes (Signed)
       Patient Name: Rickey Rice Date of Encounter: 06/12/2014    SUBJECTIVE: He has no chest discomfort and dyspnea. He is concerned about his kidney function. Renal function has normalized with overnight hydration  TELEMETRY:  Normal sinus rhythm Filed Vitals:   06/11/14 0932 06/11/14 1410 06/11/14 2051 06/12/14 0436  BP:  108/63 119/59 111/57  Pulse: 113 87 90 88  Temp:  100 F (37.8 C) 100.6 F (38.1 C) 99.6 F (37.6 C)  TempSrc:  Oral Oral Oral  Resp:  20 22 21   Height:      Weight:      SpO2:  99% 99% 98%    Intake/Output Summary (Last 24 hours) at 06/12/14 0729 Last data filed at 06/12/14 0437  Gross per 24 hour  Intake    240 ml  Output   1300 ml  Net  -1060 ml   LABS: Basic Metabolic Panel:  Recent Labs  06/10/14 0400 06/11/14 0521 06/12/14 0600  NA 146* 144 144  K 3.5 3.7 3.7  CL 107 106 108  CO2 27 28 31   GLUCOSE 104* 129* 130*  BUN 39* 39* 28*  CREATININE 1.42* 1.44* 1.28  CALCIUM 9.0 9.0 8.6  MG 1.9 2.1  --   PHOS 4.8* 2.2*  --    CBC:  Recent Labs  06/11/14 0521 06/12/14 0600  WBC 6.3 7.6  HGB 11.0* 9.5*  HCT 34.4* 30.1*  MCV 95.3 94.4  PLT 175 153  Fasting Lipid Panel:  Recent Labs  06/10/14 0400  TRIG 68    Radiology/Studies:  No new data  Physical Exam: Blood pressure 111/57, pulse 88, temperature 99.6 F (37.6 C), temperature source Oral, resp. rate 21, height 6' (1.829 m), weight 307 lb 3.2 oz (139.345 kg), SpO2 98 %. Weight change:   Wt Readings from Last 3 Encounters:  06/11/14 307 lb 3.2 oz (139.345 kg)  02/03/14 334 lb (151.501 kg)  01/26/14 337 lb (152.862 kg)    Lying flat in bed. Chest is clear anteriorly Cardiac exam no gallop Extremities no edema  ASSESSMENT:  1. Cardiac arrest, PA, uncertain cause. We need to exclude the possibility of coronary disease in this diabetic who is obese, hyperlipidemic, and with a background history of hypertension. 2. Systolic heart failure, presumed acute.  Uncertain etiology. Echo EF 40% 3. Controlled blood pressure 4. Obesity 5. Acute kidney injury, with normal function based on today's laboratory data after overnight hydration   Plan:  1. Left heart cath with coronary angiography, with limited contrast exposure possible. Procedure and risks of stroke, death, myocardial infarction, acute kidney injury, bleeding, limb ischemia, among others were discussed and accepted.  Demetrios Isaacs 06/12/2014, 7:29 AM

## 2014-06-12 NOTE — Interval H&P Note (Signed)
History and Physical Interval Note:  06/12/2014 4:27 PM  [name]  has presented today for surgery, with the diagnosis of [place]. The various methods of treatment have been discussed with the patient and family. After consideration of risks, benefits and other options for treatment, the patient has consented to  Procedure(s): LEFT HEART CATHETERIZATION WITH CORONARY ANGIOGRAM (N/A) +/- PCI as a surgical intervention .  The patient's history has been reviewed, patient examined, no change in status, stable for surgery.  I have reviewed the patient's chart and labs.  Questions were answered to the patient's satisfaction.     [place], [place]  Cath Lab Visit (complete for each Cath Lab visit)  Clinical Evaluation Leading to the Procedure:   ACS: Yes.   -- CARDIAC ARREST  Non-ACS:    Anginal Classification: No Symptoms  Anti-ischemic medical therapy: Minimal Therapy (1 class of medications)  Non-Invasive Test Results: Intermediate-risk stress test findings: cardiac mortality 1-3%/year - ef 40 % s/p Cardiac Arrest  Prior CABG: No previous CABG   AUC:  For Cath: CAD Assessment (Coronary Angiography With or Without Left Heart Catheterization and/or Left Ventriculography)  Patient Information:  Suspected ACS, Intermediate Risk  AUC Score:   A (8)   Indication:   4  *  For PCI TIMI SCORE  Patient Information:  TIMI Score is 2  UA/NSTEMI and low-risk features (e.g., TIMI score  Revascularization of the presumed culprit artery   U (6)  Indication: 9; Score: 6  HARDING, Leonie Green, M.D., M.S. Interventional Cardiologist   Pager # [phone]

## 2014-06-12 NOTE — Progress Notes (Signed)
PROGRESS NOTE  GHALI MORISSETTE JAS:[id number] DOB: 07-27-1940 DOA: 06/07/2014 PCP: Elyn Peers, MD  HPI/Recap of past 24 hours: Patient is a 74 year old male with past medical history of BPH, diabetes mellitus with stage II chronic kidney disease and central hypertension who was admitted on 3/13 after suffering a cardiac arrest initially with pulseless electrical activity and then ventricular fibrillation. Patient was able to be resuscitated and was awakened emergency room, but then intubated after he removed his airway. Over the next few days, patient able to be weaned off of pressor support and extubated. His mentation continues to improve. Cardiology consulted and echocardiogram noted diffuse hypokinesis with ejection fraction of 40%. DVT ruled out. Patient's CT angiogram noted no evidence of PE but was poor quality. A VQ scan was found to have low probability. Cardiology plans to take patient for cardiac catheterization on 3/18. Diuretics have been held given mild acute on chronic renal failure with plans for upcoming catheterization.  Seen before catheterization. Resting comfortably. No complaints.  Assessment/Plan: Active Problems:    Anoxic encephalopathy: Discussed with wife and son. For the most part, patient's mentation appears intact although at times he has some off judgment and problems with remembering things in the past week   Cardiac arrest with PEA: For planned cardiac catheterization    Acute respiratory failure with hypoxemia: Secondary to acute systolic congestive heart failure. Much improved and breathing more comfortably , now on very little oxygen support   Congestive dilated cardiomyopathy, holding diuretics for now    Essential hypertension: Continue antihypertensives      acute kidney insult in the setting of CKD (chronic kidney disease) stage 2, GFR 60-89 ml/min from diabetes mellitus: Holding on diuretics for now. Creatinine today at 1.28, tested been  diabetes  mellitus with renal disease: sliding scale only for now, holding oral medications Morbid obesity: Patient is criteria with BMI greater than 40    Code Status: full code   Family Communication: Spoke with son and wife at the bedside  Disposition Plan: Cardiac catheterization today   Consultants:  Critical care  Cardiology   Procedures:   Cardiac catheterization plan 3/18  intubation 3/13-3/15   echocardiogram done 3/13: Left bundle branch block, decreased ejection fraction to 40%  Lower extremity Dopplers done 3/14: No evidence of DVT  Antibiotics:   None   Objective: BP 124/60 mmHg  Pulse 77  Temp(Src) 98.6 F (37 C) (Oral)  Resp 20  Ht 6' (1.829 m)  Wt 139.345 kg (307 lb 3.2 oz)  BMI 41.65 kg/m2  SpO2 100%  Intake/Output Summary (Last 24 hours) at 06/12/14 1559 Last data filed at 06/12/14 0700  Gross per 24 hour  Intake    400 ml  Output   1300 ml  Net   -900 ml   Filed Weights   06/09/14 0500 06/10/14 0500 06/11/14 0352  Weight: 151.56 kg (334 lb 2.1 oz) 144.06 kg (317 lb 9.5 oz) 139.345 kg (307 lb 3.2 oz)    Exam:   General:  Resting comfortably, no acute distress  Cardiovascular: regular rate and rhythm, A1-P3, 2/6 systolic ejection murmur  Respiratory: Clear to auscultation bilaterally  Abdomen: soft, nontender, nondistended, positive bowel sounds  Musculoskeletal: no clubbing or cyanosis, 2+ pitting edema from the knees down   Data Reviewed: Basic Metabolic Panel:  Recent Labs Lab 06/07/14 1350  06/08/14 0234 06/09/14 0535 06/10/14 0400 06/11/14 0521 06/12/14 0600  NA 142  < > 140 145 146* 144 144  K 3.5  < >  3.9 4.3 3.5 3.7 3.7  CL 109  < > 106 105 107 106 108  CO2 23  --  29 28 27 28 31   GLUCOSE 224*  < > 123* 113* 104* 129* 130*  BUN 22  < > 25* 33* 39* 39* 28*  CREATININE 1.14  < > 1.20 1.21 1.42* 1.44* 1.28  CALCIUM 7.8*  --  8.2* 8.7 9.0 9.0 8.6  MG 1.7  --   --  2.1 1.9 2.1  --   PHOS 4.8*  --   --  4.8* 4.8* 2.2*   --   < > = values in this interval not displayed. Liver Function Tests:  Recent Labs Lab 06/07/14 1350  AST 450*  ALT 637*  ALKPHOS 98  BILITOT 0.4  PROT 5.7*  ALBUMIN 2.9*   No results for input(s): LIPASE, AMYLASE in the last 168 hours. No results for input(s): AMMONIA in the last 168 hours. CBC:  Recent Labs Lab 06/07/14 1350  06/08/14 0234 06/09/14 0535 06/10/14 0400 06/11/14 0521 06/12/14 0600  WBC 9.2  --  5.5 7.4 7.3 6.3 7.6  NEUTROABS 5.6  --   --   --   --   --   --   HGB 10.5*  < > 11.1* 12.5* 12.3* 11.0* 9.5*  HCT 32.9*  < > 34.1* 38.2* 38.0* 34.4* 30.1*  MCV 94.0  --  92.9 95.3 94.5 95.3 94.4  PLT 164  --  154 180 188 175 153  < > = values in this interval not displayed. Cardiac Enzymes:    Recent Labs Lab 06/07/14 1350  TROPONINI <0.03   BNP (last 3 results) No results for input(s): BNP in the last 8760 hours.  ProBNP (last 3 results) No results for input(s): PROBNP in the last 8760 hours.  CBG:  Recent Labs Lab 06/11/14 2048 06/12/14 0042 06/12/14 0431 06/12/14 0809 06/12/14 1242  GLUCAP 192* 130* 143* 157* 106*    Recent Results (from the past 240 hour(s))  MRSA PCR Screening     Status: None   Collection Time: 06/07/14  5:09 PM  Result Value Ref Range Status   MRSA by PCR NEGATIVE NEGATIVE Final    Comment:        The GeneXpert MRSA Assay (FDA approved for NASAL specimens only), is one component of a comprehensive MRSA colonization surveillance program. It is not intended to diagnose MRSA infection nor to guide or monitor treatment for MRSA infections.      Studies: No results found.  Scheduled Meds: . amLODipine  10 mg Oral Daily  . antiseptic oral rinse  7 mL Mouth Rinse BID  . aspirin  325 mg Per Tube Daily  . famotidine  20 mg Per Tube BID  . heparin subcutaneous  5,000 Units Subcutaneous 3 times per day  . insulin aspart  0-5 Units Subcutaneous QHS  . insulin aspart  0-9 Units Subcutaneous TID WC  .  levothyroxine  75 mcg Per Tube Daily  . rosuvastatin  10 mg Per Tube q1800  . sodium chloride  3 mL Intravenous Q12H  . tamsulosin  0.4 mg Oral Daily     Continuous Infusions: . sodium chloride 10 mL/hr at 06/10/14 2301  . sodium chloride 50 mL/hr (06/12/14 0445)     Time spent: 15 minutes  [facility] Pager [phone]. If 7PM-7AM, please contact night-coverage at [email or web address], password [facility] 06/12/2014, 3:59 PM  LOS: 5 days

## 2014-06-12 NOTE — H&P (View-Only) (Signed)
       Patient Name: Rickey Rice Date of Encounter: 06/12/2014    SUBJECTIVE: He has no chest discomfort and dyspnea. He is concerned about his kidney function. Renal function has normalized with overnight hydration  TELEMETRY:  Normal sinus rhythm Filed Vitals:   06/11/14 0932 06/11/14 1410 06/11/14 2051 06/12/14 0436  BP:  108/63 119/59 111/57  Pulse: 113 87 90 88  Temp:  100 F (37.8 C) 100.6 F (38.1 C) 99.6 F (37.6 C)  TempSrc:  Oral Oral Oral  Resp:  20 22 21   Height:      Weight:      SpO2:  99% 99% 98%    Intake/Output Summary (Last 24 hours) at 06/12/14 0729 Last data filed at 06/12/14 0437  Gross per 24 hour  Intake    240 ml  Output   1300 ml  Net  -1060 ml   LABS: Basic Metabolic Panel:  Recent Labs  06/10/14 0400 06/11/14 0521 06/12/14 0600  NA 146* 144 144  K 3.5 3.7 3.7  CL 107 106 108  CO2 27 28 31   GLUCOSE 104* 129* 130*  BUN 39* 39* 28*  CREATININE 1.42* 1.44* 1.28  CALCIUM 9.0 9.0 8.6  MG 1.9 2.1  --   PHOS 4.8* 2.2*  --    CBC:  Recent Labs  06/11/14 0521 06/12/14 0600  WBC 6.3 7.6  HGB 11.0* 9.5*  HCT 34.4* 30.1*  MCV 95.3 94.4  PLT 175 153  Fasting Lipid Panel:  Recent Labs  06/10/14 0400  TRIG 68    Radiology/Studies:  No new data  Physical Exam: Blood pressure 111/57, pulse 88, temperature 99.6 F (37.6 C), temperature source Oral, resp. rate 21, height 6' (1.829 m), weight 307 lb 3.2 oz (139.345 kg), SpO2 98 %. Weight change:   Wt Readings from Last 3 Encounters:  06/11/14 307 lb 3.2 oz (139.345 kg)  02/03/14 334 lb (151.501 kg)  01/26/14 337 lb (152.862 kg)    Lying flat in bed. Chest is clear anteriorly Cardiac exam no gallop Extremities no edema  ASSESSMENT:  1. Cardiac arrest, PA, uncertain cause. We need to exclude the possibility of coronary disease in this diabetic who is obese, hyperlipidemic, and with a background history of hypertension. 2. Systolic heart failure, presumed acute.  Uncertain etiology. Echo EF 40% 3. Controlled blood pressure 4. Obesity 5. Acute kidney injury, with normal function based on today's laboratory data after overnight hydration   Plan:  1. Left heart cath with coronary angiography, with limited contrast exposure possible. Procedure and risks of stroke, death, myocardial infarction, acute kidney injury, bleeding, limb ischemia, among others were discussed and accepted.  Demetrios Isaacs 06/12/2014, 7:29 AM

## 2014-06-13 DIAGNOSIS — I429 Cardiomyopathy, unspecified: Secondary | ICD-10-CM

## 2014-06-13 LAB — CBC
HCT: 31.7 % — ABNORMAL LOW (ref 39.0–52.0)
Hemoglobin: 10.1 g/dL — ABNORMAL LOW (ref 13.0–17.0)
MCH: 30.4 pg (ref 26.0–34.0)
MCHC: 31.9 g/dL (ref 30.0–36.0)
MCV: 95.5 fL (ref 78.0–100.0)
Platelets: 156 10*3/uL (ref 150–400)
RBC: 3.32 MIL/uL — ABNORMAL LOW (ref 4.22–5.81)
RDW: 14.1 % (ref 11.5–15.5)
WBC: 5.1 10*3/uL (ref 4.0–10.5)

## 2014-06-13 LAB — GLUCOSE, CAPILLARY
GLUCOSE-CAPILLARY: 160 mg/dL — AB (ref 70–99)
GLUCOSE-CAPILLARY: 108 mg/dL — AB (ref 70–99)
Glucose-Capillary: 111 mg/dL — ABNORMAL HIGH (ref 70–99)
Glucose-Capillary: 147 mg/dL — ABNORMAL HIGH (ref 70–99)

## 2014-06-13 LAB — BASIC METABOLIC PANEL
Anion gap: 5 (ref 5–15)
BUN: 20 mg/dL (ref 6–23)
CO2: 30 mmol/L (ref 19–32)
Calcium: 8.7 mg/dL (ref 8.4–10.5)
Chloride: 107 mmol/L (ref 96–112)
Creatinine, Ser: 1.26 mg/dL (ref 0.50–1.35)
GFR calc Af Amer: 64 mL/min — ABNORMAL LOW (ref >90)
GFR calc non Af Amer: 55 mL/min — ABNORMAL LOW (ref >90)
Glucose, Bld: 138 mg/dL — ABNORMAL HIGH (ref 70–99)
Potassium: 3.7 mmol/L (ref 3.5–5.1)
Sodium: 142 mmol/L (ref 135–145)

## 2014-06-13 LAB — HEMOGLOBIN A1C
Hgb A1c MFr Bld: 6.7 % — ABNORMAL HIGH (ref 4.8–5.6)
Mean Plasma Glucose: 146 mg/dL

## 2014-06-13 MED ORDER — SODIUM CHLORIDE 0.9 % IJ SOLN
10.0000 mL | INTRAMUSCULAR | Status: DC | PRN
  Administered 2014-06-13: 10 mL
  Administered 2014-06-15: 30 mL
  Administered 2014-06-16: 20 mL
  Filled 2014-06-13 (×3): qty 40

## 2014-06-13 MED ORDER — FUROSEMIDE 40 MG PO TABS
40.0000 mg | ORAL_TABLET | Freq: Two times a day (BID) | ORAL | Status: DC
  Administered 2014-06-13 – 2014-06-19 (×12): 40 mg via ORAL
  Filled 2014-06-13 (×16): qty 1

## 2014-06-13 MED ORDER — MENTHOL 3 MG MT LOZG
1.0000 | LOZENGE | OROMUCOSAL | Status: DC | PRN
  Filled 2014-06-13 (×2): qty 9

## 2014-06-13 NOTE — Progress Notes (Signed)
PROGRESS NOTE  Rickey Rice:[id number] DOB: 16-Jan-1941 DOA: 06/07/2014 PCP: Elyn Peers, MD  HPI/Recap of past 24 hours: Patient is a 74 year old male with past medical history of BPH, diabetes mellitus with stage II chronic kidney disease and central hypertension who was admitted on 3/13 after suffering a cardiac arrest initially with pulseless electrical activity and then ventricular fibrillation. Patient was able to be resuscitated and was awakened emergency room, but then intubated after he removed his airway. Over the next few days, patient able to be weaned off of pressor support and extubated. His mentation continues to improve. Cardiology consulted and echocardiogram noted diffuse hypokinesis with ejection fraction of 40%. DVT ruled out. Patient's CT angiogram noted no evidence of PE but was poor quality. A VQ scan was found to have low probability.   Patient underwent cardiac catheterization 3/18 which noted some mild disease, but nothing to account for this degree of heart failure and secondary arrhythmia. Felt to be nonischemic cardiomyopathy. Patient's renal function post cath today stable. He himself has no complaints. No shortness of breath.  Assessment/Plan: Active Problems:    Anoxic encephalopathy: Discussed with wife and son. For the most part, patient's mentation appears intact although at times he has some off judgment and problems with remembering things in the past week. He is quite appropriate with me.   Cardiac arrest with PEA/nonischemic cardiomyopathy: Felt to be secondary to nonischemic cardiomyopathy. Ejection fraction confirmed decreased at 40%. Plan now to start diuresing and workup further nonischemic causes. In addition, we'll provide education    Acute respiratory failure with hypoxemia: Secondary to acute systolic congestive heart failure. Resolved. Patient now on room air    Essential hypertension: Continue antihypertensives      acute kidney insult  in the setting of CKD (chronic kidney disease) stage 2, GFR 60-89 ml/min from diabetes mellitus: Holding on diuretics for now. Creatinine remained steady at 1.28. Monitor while diuresing.   diabetes mellitus with renal disease: sliding scale only for now, holding oral medications. CBGs stable, 160 or below,  Morbid obesity: Patient is criteria with BMI greater than 40    Code Status: full code   Family Communication: Spoke with son and wife at the bedside  Disposition Plan: Home once fully diuresed and workup complete, likely next few days   Consultants:  Critical care  Cardiology   Procedures:   Cardiac catheterization plan 3/18: Mild disease, no evidence of obstruction, confirmed EF of 40%  intubation 3/13-3/15   echocardiogram done 3/13: Left bundle branch block, decreased ejection fraction to 40%  Lower extremity Dopplers done 3/14: No evidence of DVT  Antibiotics:   None   Objective: BP 114/59 mmHg  Pulse 111  Temp(Src) 98.9 F (37.2 C) (Oral)  Resp 18  Ht 6' (1.829 m)  Wt 145.3 kg (320 lb 5.3 oz)  BMI 43.43 kg/m2  SpO2 98%  Intake/Output Summary (Last 24 hours) at 06/13/14 1509 Last data filed at 06/13/14 1300  Gross per 24 hour  Intake    240 ml  Output   1550 ml  Net  -1310 ml   Filed Weights   06/10/14 0500 06/11/14 0352 06/13/14 0514  Weight: 144.06 kg (317 lb 9.5 oz) 139.345 kg (307 lb 3.2 oz) 145.3 kg (320 lb 5.3 oz)    Exam:   General:  Resting comfortably, no acute distress  Cardiovascular: regular rate and rhythm, N9-G9, 2/6 systolic ejection murmur  Respiratory: Clear to auscultation bilaterally  Abdomen: soft, nontender, nondistended, positive bowel  sounds  Musculoskeletal: no clubbing or cyanosis, 2+ pitting edema from the knees down   Data Reviewed: Basic Metabolic Panel:  Recent Labs Lab 06/07/14 1350  06/09/14 0535 06/10/14 0400 06/11/14 0521 06/12/14 0600 06/13/14 0520  NA 142  < > 145 146* 144 144 142  K 3.5  <  > 4.3 3.5 3.7 3.7 3.7  CL 109  < > 105 107 106 108 107  CO2 23  < > 28 27 28 31 30   GLUCOSE 224*  < > 113* 104* 129* 130* 138*  BUN 22  < > 33* 39* 39* 28* 20  CREATININE 1.14  < > 1.21 1.42* 1.44* 1.28 1.26  CALCIUM 7.8*  < > 8.7 9.0 9.0 8.6 8.7  MG 1.7  --  2.1 1.9 2.1  --   --   PHOS 4.8*  --  4.8* 4.8* 2.2*  --   --   < > = values in this interval not displayed. Liver Function Tests:  Recent Labs Lab 06/07/14 1350  AST 450*  ALT 637*  ALKPHOS 98  BILITOT 0.4  PROT 5.7*  ALBUMIN 2.9*   No results for input(s): LIPASE, AMYLASE in the last 168 hours. No results for input(s): AMMONIA in the last 168 hours. CBC:  Recent Labs Lab 06/07/14 1350  06/09/14 0535 06/10/14 0400 06/11/14 0521 06/12/14 0600 06/13/14 0520  WBC 9.2  < > 7.4 7.3 6.3 7.6 5.1  NEUTROABS 5.6  --   --   --   --   --   --   HGB 10.5*  < > 12.5* 12.3* 11.0* 9.5* 10.1*  HCT 32.9*  < > 38.2* 38.0* 34.4* 30.1* 31.7*  MCV 94.0  < > 95.3 94.5 95.3 94.4 95.5  PLT 164  < > 180 188 175 153 156  < > = values in this interval not displayed. Cardiac Enzymes:    Recent Labs Lab 06/07/14 1350  TROPONINI <0.03   BNP (last 3 results) No results for input(s): BNP in the last 8760 hours.  ProBNP (last 3 results) No results for input(s): PROBNP in the last 8760 hours.  CBG:  Recent Labs Lab 06/12/14 1242 06/12/14 1609 06/12/14 2016 06/13/14 0650 06/13/14 1115  GLUCAP 106* 114* 100* 111* 160*    Recent Results (from the past 240 hour(s))  MRSA PCR Screening     Status: None   Collection Time: 06/07/14  5:09 PM  Result Value Ref Range Status   MRSA by PCR NEGATIVE NEGATIVE Final    Comment:        The GeneXpert MRSA Assay (FDA approved for NASAL specimens only), is one component of a comprehensive MRSA colonization surveillance program. It is not intended to diagnose MRSA infection nor to guide or monitor treatment for MRSA infections.      Studies: No results found.  Scheduled  Meds: . amLODipine  10 mg Oral Daily  . antiseptic oral rinse  7 mL Mouth Rinse BID  . aspirin  325 mg Per Tube Daily  . famotidine  20 mg Per Tube BID  . heparin subcutaneous  5,000 Units Subcutaneous 3 times per day  . insulin aspart  0-5 Units Subcutaneous QHS  . insulin aspart  0-9 Units Subcutaneous TID WC  . levothyroxine  75 mcg Per Tube Daily  . rosuvastatin  10 mg Per Tube q1800  . tamsulosin  0.4 mg Oral Daily     Continuous Infusions: . sodium chloride 10 mL/hr at 06/10/14 2301  Time spent: 25 minutes  [place] [facility] Pager [phone]. If 7PM-7AM, please contact night-coverage at [email or web address], password [facility] 06/13/2014, 3:09 PM  LOS: 6 days

## 2014-06-13 NOTE — Progress Notes (Signed)
See cardiac catheterization data from yesterday.  Daryel November, MD

## 2014-06-14 DIAGNOSIS — I472 Ventricular tachycardia: Secondary | ICD-10-CM

## 2014-06-14 LAB — URINE MICROSCOPIC-ADD ON

## 2014-06-14 LAB — BASIC METABOLIC PANEL
ANION GAP: 7 (ref 5–15)
BUN: 16 mg/dL (ref 6–23)
CHLORIDE: 104 mmol/L (ref 96–112)
CO2: 31 mmol/L (ref 19–32)
CREATININE: 1.21 mg/dL (ref 0.50–1.35)
Calcium: 8.8 mg/dL (ref 8.4–10.5)
GFR calc Af Amer: 67 mL/min — ABNORMAL LOW (ref >90)
GFR calc non Af Amer: 58 mL/min — ABNORMAL LOW (ref >90)
Glucose, Bld: 125 mg/dL — ABNORMAL HIGH (ref 70–99)
Potassium: 3.5 mmol/L (ref 3.5–5.1)
Sodium: 142 mmol/L (ref 135–145)

## 2014-06-14 LAB — URINALYSIS, ROUTINE W REFLEX MICROSCOPIC
Bilirubin Urine: NEGATIVE
Glucose, UA: NEGATIVE mg/dL
Ketones, ur: NEGATIVE mg/dL
Nitrite: POSITIVE — AB
Protein, ur: NEGATIVE mg/dL
Specific Gravity, Urine: 1.012 (ref 1.005–1.030)
UROBILINOGEN UA: 0.2 mg/dL (ref 0.0–1.0)
pH: 5 (ref 5.0–8.0)

## 2014-06-14 LAB — GLUCOSE, CAPILLARY
GLUCOSE-CAPILLARY: 166 mg/dL — AB (ref 70–99)
GLUCOSE-CAPILLARY: 132 mg/dL — AB (ref 70–99)
Glucose-Capillary: 103 mg/dL — ABNORMAL HIGH (ref 70–99)
Glucose-Capillary: 123 mg/dL — ABNORMAL HIGH (ref 70–99)

## 2014-06-14 LAB — CBC
HCT: 30.1 % — ABNORMAL LOW (ref 39.0–52.0)
HEMOGLOBIN: 9.7 g/dL — AB (ref 13.0–17.0)
MCH: 30.6 pg (ref 26.0–34.0)
MCHC: 32.2 g/dL (ref 30.0–36.0)
MCV: 95 fL (ref 78.0–100.0)
Platelets: 150 10*3/uL (ref 150–400)
RBC: 3.17 MIL/uL — ABNORMAL LOW (ref 4.22–5.81)
RDW: 13.9 % (ref 11.5–15.5)
WBC: 5 10*3/uL (ref 4.0–10.5)

## 2014-06-14 MED ORDER — LIVING BETTER WITH HEART FAILURE BOOK: Freq: Once | Status: DC

## 2014-06-14 NOTE — Progress Notes (Signed)
Patient's telemetry reveals 3 beats of ventricular tachycardia. I'm not sure if this would change the approach to the patient's post hospital care. However, since we are seeing spontaneous ventricular ectopy on the monitor, I will ask the electrophysiology team to comment on whether the patient should go home with any type of device.  Daryel November, MD

## 2014-06-14 NOTE — Progress Notes (Signed)
06/14/2014 4:59 AM The patient might have a UTI.  The urinary analysis this morning showed some white blood cells and bacteria in the urine sample. Will inform the next nurse of these findings.  Will continue to monitor the patient. Lupita Dawn

## 2014-06-14 NOTE — Progress Notes (Signed)
06/14/2014 1:45 PM Pt. Ambulated 524ft. With personal walker and a family member.  Tolerated well. [place]

## 2014-06-14 NOTE — Progress Notes (Signed)
PROGRESS NOTE  Rickey Rice ATF:[id number] DOB: June 04, 1940 DOA: 06/07/2014 PCP: Elyn Peers, MD  HPI/Recap of past 24 hours: Patient is a 74 year old male with past medical history of BPH, diabetes mellitus with stage II chronic kidney disease and central hypertension who was admitted on 3/13 after suffering a cardiac arrest initially with pulseless electrical activity and then ventricular fibrillation. Patient was able to be resuscitated and was awakened emergency room, but then intubated after he removed his airway. Over the next few days, patient able to be weaned off of pressor support and extubated. His mentation continues to improve. Cardiology consulted and echocardiogram noted diffuse hypokinesis with ejection fraction of 40%. DVT ruled out. Patient's CT angiogram noted no evidence of PE but was poor quality. A VQ scan was found to have low probability.   Patient underwent cardiac catheterization 3/18 which noted some mild disease, but nothing to account for this degree of heart failure and secondary arrhythmia. Felt to be nonischemic cardiomyopathy. Patient continues to do well with no complaints.  Assessment/Plan: Active Problems:    Anoxic encephalopathy: Discussed with wife and son. For the most part, patient's mentation appears intact although at times he has some off judgment and problems with remembering things in the past week. He is quite appropriate with me.    Cardiac arrest with PEA/nonischemic cardiomyopathy: Felt to be secondary to nonischemic cardiomyopathy. Ejection fraction confirmed decreased at 40%. We are continuing to diurese him. He is down now 12 L . Weight is at 310 In addition, we'll provide education    Acute respiratory failure with hypoxemia: Secondary to acute systolic congestive heart failure. Resolved. Patient now on room air    Essential hypertension: Continue antihypertensives . Blood pressure excellent     acute kidney insult in the setting of CKD  (chronic kidney disease) stage 2, GFR 60-89 ml/min from diabetes mellitus: Holding on diuretics for now. Creatinine remained steady at 1.28. Monitor while diuresing.   diabetes mellitus with renal disease: sliding scale only for now, holding oral medications. CBGs stable, 160 or below,  Morbid obesity: Patient is criteria with BMI greater than 40    Code Status: full code   Family Communication: Wife at the bedside  Disposition Plan: Home once fully diuresed and workup complete, likely Tuesday   Consultants:  Critical care  Cardiology   Procedures:   Cardiac catheterization plan 3/18: Mild disease, no evidence of obstruction, confirmed EF of 40%  intubation 3/13-3/15   echocardiogram done 3/13: Left bundle branch block, decreased ejection fraction to 40%  Lower extremity Dopplers done 3/14: No evidence of DVT  Antibiotics:   None   Objective: BP 118/58 mmHg  Pulse 83  Temp(Src) 99.8 F (37.7 C) (Oral)  Resp 19  Ht 6' (1.829 m)  Wt 140.8 kg (310 lb 6.5 oz)  BMI 42.09 kg/m2  SpO2 96%  Intake/Output Summary (Last 24 hours) at 06/14/14 1716 Last data filed at 06/14/14 1248  Gross per 24 hour  Intake    130 ml  Output   3725 ml  Net  -3595 ml   Filed Weights   06/11/14 0352 06/13/14 0514 06/14/14 0318  Weight: 139.345 kg (307 lb 3.2 oz) 145.3 kg (320 lb 5.3 oz) 140.8 kg (310 lb 6.5 oz)    Exam:   General:  Alert and oriented 3, no acute distress  Cardiovascular: regular rate and rhythm, Y7-C6, 2/6 systolic ejection murmur  Respiratory: Clear to auscultation bilaterally  Abdomen: soft, nontender, nondistended, positive bowel sounds  Musculoskeletal: no clubbing or cyanosis, 1+ pitting edema from the knees down   Data Reviewed: Basic Metabolic Panel:  Recent Labs Lab 06/09/14 0535 06/10/14 0400 06/11/14 0521 06/12/14 0600 06/13/14 0520 06/14/14 0551  NA 145 146* 144 144 142 142  K 4.3 3.5 3.7 3.7 3.7 3.5  CL 105 107 106 108 107 104  CO2  28 27 28 31 30 31   GLUCOSE 113* 104* 129* 130* 138* 125*  BUN 33* 39* 39* 28* 20 16  CREATININE 1.21 1.42* 1.44* 1.28 1.26 1.21  CALCIUM 8.7 9.0 9.0 8.6 8.7 8.8  MG 2.1 1.9 2.1  --   --   --   PHOS 4.8* 4.8* 2.2*  --   --   --    Liver Function Tests: No results for input(s): AST, ALT, ALKPHOS, BILITOT, PROT, ALBUMIN in the last 168 hours. No results for input(s): LIPASE, AMYLASE in the last 168 hours. No results for input(s): AMMONIA in the last 168 hours. CBC:  Recent Labs Lab 06/10/14 0400 06/11/14 0521 06/12/14 0600 06/13/14 0520 06/14/14 0551  WBC 7.3 6.3 7.6 5.1 5.0  HGB 12.3* 11.0* 9.5* 10.1* 9.7*  HCT 38.0* 34.4* 30.1* 31.7* 30.1*  MCV 94.5 95.3 94.4 95.5 95.0  PLT 188 175 153 156 150   Cardiac Enzymes:   No results for input(s): CKTOTAL, CKMB, CKMBINDEX, TROPONINI in the last 168 hours. BNP (last 3 results) No results for input(s): BNP in the last 8760 hours.  ProBNP (last 3 results) No results for input(s): PROBNP in the last 8760 hours.  CBG:  Recent Labs Lab 06/13/14 1620 06/13/14 2134 06/14/14 0625 06/14/14 1120 06/14/14 1622  GLUCAP 108* 147* 103* 166* 132*    Recent Results (from the past 240 hour(s))  MRSA PCR Screening     Status: None   Collection Time: 06/07/14  5:09 PM  Result Value Ref Range Status   MRSA by PCR NEGATIVE NEGATIVE Final    Comment:        The GeneXpert MRSA Assay (FDA approved for NASAL specimens only), is one component of a comprehensive MRSA colonization surveillance program. It is not intended to diagnose MRSA infection nor to guide or monitor treatment for MRSA infections.      Studies: No results found.  Scheduled Meds: . amLODipine  10 mg Oral Daily  . antiseptic oral rinse  7 mL Mouth Rinse BID  . aspirin  325 mg Per Tube Daily  . famotidine  20 mg Per Tube BID  . furosemide  40 mg Oral BID  . heparin subcutaneous  5,000 Units Subcutaneous 3 times per day  . insulin aspart  0-5 Units Subcutaneous  QHS  . insulin aspart  0-9 Units Subcutaneous TID WC  . levothyroxine  75 mcg Per Tube Daily  . rosuvastatin  10 mg Per Tube q1800  . tamsulosin  0.4 mg Oral Daily     Continuous Infusions: . sodium chloride 10 mL/hr at 06/10/14 2301     Time spent: 15 minutes  [place] [facility] Pager [phone]. If 7PM-7AM, please contact night-coverage at [email or web address], password [facility] 06/14/2014, 5:16 PM  LOS: 7 days

## 2014-06-14 NOTE — Progress Notes (Signed)
06/13/2014 09:00 PM The patient ambulated about 550 feet in the hallway with a walker and son at his side. Patient tolerated the activity well.  Will continue to monitor the patient. Rickey Rice

## 2014-06-15 ENCOUNTER — Telehealth: Payer: Self-pay | Admitting: Internal Medicine

## 2014-06-15 DIAGNOSIS — G931 Anoxic brain damage, not elsewhere classified: Secondary | ICD-10-CM

## 2014-06-15 DIAGNOSIS — I469 Cardiac arrest, cause unspecified: Secondary | ICD-10-CM

## 2014-06-15 DIAGNOSIS — I4901 Ventricular fibrillation: Secondary | ICD-10-CM

## 2014-06-15 LAB — GLUCOSE, CAPILLARY
GLUCOSE-CAPILLARY: 142 mg/dL — AB (ref 70–99)
GLUCOSE-CAPILLARY: 150 mg/dL — AB (ref 70–99)
Glucose-Capillary: 133 mg/dL — ABNORMAL HIGH (ref 70–99)
Glucose-Capillary: 153 mg/dL — ABNORMAL HIGH (ref 70–99)

## 2014-06-15 LAB — CBC
HEMATOCRIT: 31.9 % — AB (ref 39.0–52.0)
HEMOGLOBIN: 10.5 g/dL — AB (ref 13.0–17.0)
MCH: 31.2 pg (ref 26.0–34.0)
MCHC: 32.9 g/dL (ref 30.0–36.0)
MCV: 94.7 fL (ref 78.0–100.0)
Platelets: 200 10*3/uL (ref 150–400)
RBC: 3.37 MIL/uL — ABNORMAL LOW (ref 4.22–5.81)
RDW: 13.6 % (ref 11.5–15.5)
WBC: 4.8 10*3/uL (ref 4.0–10.5)

## 2014-06-15 MED ORDER — CHLORHEXIDINE GLUCONATE 4 % EX LIQD
60.0000 mL | Freq: Once | CUTANEOUS | Status: DC
  Filled 2014-06-15 (×2): qty 60

## 2014-06-15 MED ORDER — CARVEDILOL 6.25 MG PO TABS
6.2500 mg | ORAL_TABLET | Freq: Two times a day (BID) | ORAL | Status: DC
  Administered 2014-06-16 – 2014-06-19 (×7): 6.25 mg via ORAL
  Filled 2014-06-15 (×9): qty 1

## 2014-06-15 MED ORDER — SODIUM CHLORIDE 0.9 % IR SOLN
80.0000 mg | Status: AC
  Filled 2014-06-15: qty 2

## 2014-06-15 MED ORDER — CELECOXIB 200 MG PO CAPS
200.0000 mg | ORAL_CAPSULE | Freq: Every day | ORAL | Status: DC
  Administered 2014-06-16 – 2014-06-19 (×4): 200 mg via ORAL
  Filled 2014-06-15 (×4): qty 1

## 2014-06-15 MED ORDER — SPIRONOLACTONE 12.5 MG HALF TABLET
12.5000 mg | ORAL_TABLET | Freq: Every day | ORAL | Status: DC
  Administered 2014-06-17 – 2014-06-18 (×2): 12.5 mg via ORAL
  Filled 2014-06-15 (×3): qty 1

## 2014-06-15 MED ORDER — LISINOPRIL 10 MG PO TABS
10.0000 mg | ORAL_TABLET | Freq: Every day | ORAL | Status: DC
  Administered 2014-06-15 – 2014-06-19 (×5): 10 mg via ORAL
  Filled 2014-06-15 (×5): qty 1

## 2014-06-15 MED ORDER — SODIUM CHLORIDE 0.9 % IV SOLN: INTRAVENOUS | Status: DC

## 2014-06-15 MED ORDER — CHLORHEXIDINE GLUCONATE 4 % EX LIQD
60.0000 mL | Freq: Once | CUTANEOUS | Status: DC
  Filled 2014-06-15: qty 60

## 2014-06-15 MED ORDER — DEXTROSE 5 % IV SOLN
3.0000 g | INTRAVENOUS | Status: AC
  Filled 2014-06-15: qty 3000

## 2014-06-15 NOTE — Progress Notes (Signed)
Patient Name: Rickey Rice      SUBJECTIVE: admitted 3/13 with aborted cardiac arrest in context of subsequently demonstrated nonischemic cardiomyopathy  ECG >> LBBB  CAth nonobstructive disease and EF 40%  He was in church. He collapsed without warning. CPR was started. EMS was called.  The ER record >>EMS started by bystanders, AED applied V-fib was shocked 3 times, when EMS arrived PEA   The physician notes say that PEA was initial rhythm detected by EMS. There is no reference.AED  As in  initial ER triage note  He was cooled and has now awakened  There is no evidence of residual encepalopathy  He has at baseline modest DOE < 1 flt of stairs and some edema  Denies syncope or LH or tacypalpitations  Tele Monitor >>VT NS   Past Medical History  Diagnosis Date  . Diabetes mellitus without complication     type 2  . Hypertension   . Hypothyroidism   . Morbid obesity   . Dyslipidemia   . LBBB (left bundle branch block)   . History of nuclear stress test 04/2012    lexiscan - 2 day protocol; low risk study, evidence of inferrior & apical scar but no ischemia   . Arthritis   . Shortness of breath     with exertion   . Prostate enlargement     Scheduled Meds:  Scheduled Meds: . amLODipine  10 mg Oral Daily  . antiseptic oral rinse  7 mL Mouth Rinse BID  . [START ON 06/16/2014] celecoxib  200 mg Oral Daily  . famotidine  20 mg Per Tube BID  . furosemide  40 mg Oral BID  . heparin subcutaneous  5,000 Units Subcutaneous 3 times per day  . insulin aspart  0-5 Units Subcutaneous QHS  . insulin aspart  0-9 Units Subcutaneous TID WC  . levothyroxine  75 mcg Per Tube Daily  . Living Better with Heart Failure Book   Does not apply Once  . rosuvastatin  10 mg Per Tube q1800  . tamsulosin  0.4 mg Oral Daily   Continuous Infusions: . sodium chloride 10 mL/hr at 06/10/14 2301   albuterol, fentaNYL, menthol-cetylpyridinium, RESOURCE THICKENUP CLEAR, sodium chloride,  white petrolatum  Family History  Problem Relation Age of Onset  . Cancer Mother   . Kidney disease Brother   . Hypertension Sister    History  Substance Use Topics  . Smoking status: Never Smoker   . Smokeless tobacco: Never Used  . Alcohol Use: No   Review of Systems  All other systems reviewed and are negative.     PHYSICAL EXAM Filed Vitals:   06/14/14 2111 06/15/14 0404 06/15/14 1027 06/15/14 1106  BP: 123/76 126/69  118/68  Pulse: 88 86 108 96  Temp: 99 F (37.2 C) 99.6 F (37.6 C)    TempSrc: Oral Oral    Resp: 18 18    Height:      Weight:  305 lb 8.9 oz (138.6 kg)    SpO2: 97% 96%     Alert and oriented in no acute distress HENT- normal Eyes- EOMI, without scleral icterus Skin- warm and dry; without rashes LN-neg Neck- supple without thyromegaly, JVP-9, carotids brisk and full without bruits Back-without CVAT or kyphosis Lungs-clear to auscultation CV-Regular rate and rhythm, nl S1 and S2, no murmurs gallops or rubs, S4-absent Abd-soft with active bowel sounds; no midline pulsation or hepatomegaly Pulses-intact femoral and distal MKS-without gross deformity  partial finger amputation r hand Neuro- Ax O, CN3-12 intact, grossly normal motor and sensory function Affect engaging   TELEMETRY: Reviewed telemetry pt in  NSR    Intake/Output Summary (Last 24 hours) at 06/15/14 1826 Last data filed at 06/15/14 1700  Gross per 24 hour  Intake    120 ml  Output   1550 ml  Net  -1430 ml    LABS: Basic Metabolic Panel:  Recent Labs Lab 06/09/14 0535 06/10/14 0400 06/11/14 0521 06/12/14 0600 06/13/14 0520 06/14/14 0551  NA 145 146* 144 144 142 142  K 4.3 3.5 3.7 3.7 3.7 3.5  CL 105 107 106 108 107 104  CO2 28 27 28 31 30 31   GLUCOSE 113* 104* 129* 130* 138* 125*  BUN 33* 39* 39* 28* 20 16  CREATININE 1.21 1.42* 1.44* 1.28 1.26 1.21  CALCIUM 8.7 9.0 9.0 8.6 8.7 8.8  MG 2.1 1.9 2.1  --   --   --   PHOS 4.8* 4.8* 2.2*  --   --   --    Cardiac  Enzymes: No results for input(s): CKTOTAL, CKMB, CKMBINDEX, TROPONINI in the last 72 hours. CBC:  Recent Labs Lab 06/09/14 0535 06/10/14 0400 06/11/14 0521 06/12/14 0600 06/13/14 0520 06/14/14 0551 06/15/14 0450  WBC 7.4 7.3 6.3 7.6 5.1 5.0 4.8  HGB 12.5* 12.3* 11.0* 9.5* 10.1* 9.7* 10.5*  HCT 38.2* 38.0* 34.4* 30.1* 31.7* 30.1* 31.9*  MCV 95.3 94.5 95.3 94.4 95.5 95.0 94.7  PLT 180 188 175 153 156 150 200       ASSESSMENT AND PLAN:  Active Problems:   PEA (Pulseless electrical activity)   Anoxic encephalopathy   Cardiac arrest   Acute respiratory failure with hypoxemia   Congestive dilated cardiomyopathy   Essential hypertension   Hypertension   CKD (chronic kidney disease) stage 2, GFR 60-89 ml/min   DM type 2 causing renal disease   BPH (benign prostatic hypertrophy)   Obesity, morbid  The record as best as i can tell is MISLEADING THE INITIAL ER RECORD suggests AED prompted shock at collapse  This is best interpreted as a primary VF arrest in the context of nonischemic cardiomyopathy and left bundle branch block. With his history of heart failure and left bundle branch block is appropriate at the time of ICD implantation to consider CRT. I discussed this with the family and they are understanding and agreeable.  As relates to the cardiac arrest, secondary prevention ICD is indicated for reduction of risk of recurrent cardiac arrest.  We will also begin him on carvedilol and discontinue his amlodipine. We will give begin him on low-dose Aldactone and lisinopril.  Furthermore we'll reduce his aspirin from 325--81 mg.  I discussed this case with Dr. Debara Pickett and  Dr. Rayann Heman. They concur with the plan   Signed, Virl Axe MD  06/15/2014

## 2014-06-15 NOTE — Progress Notes (Signed)
PROGRESS NOTE  TONEY DIFATTA HKV:[id number] DOB: 1940/09/06 DOA: 06/07/2014 PCP: Elyn Peers, MD  HPI/Recap of past 24 hours: Patient is a 74 year old male with past medical history of BPH, diabetes mellitus with stage II chronic kidney disease and central hypertension who was admitted on 3/13 after suffering a cardiac arrest initially with pulseless electrical activity and then ventricular fibrillation. Patient was able to be resuscitated and was awakened emergency room, but then intubated after he removed his airway. Over the next few days, patient able to be weaned off of pressor support and extubated. His mentation continues to improve. Cardiology consulted and echocardiogram noted diffuse hypokinesis with ejection fraction of 40%. DVT ruled out. Patient's CT angiogram noted no evidence of PE but was poor quality. A VQ scan was found to have low probability.   Patient underwent cardiac catheterization 3/18 which noted some mild disease, but nothing to account for this degree of heart failure and secondary arrhythmia. Felt to be nonischemic cardiomyopathy. Patient otherwise with no complaints. He has at this point diuresed 13 L  Assessment/Plan: Active Problems:    Anoxic encephalopathy: Discussed with wife and son. For the most part, patient's mentation appears intact although at times he has some off judgment and problems with remembering things in the past week. He continues to remain appropriate with me    Cardiac arrest with PEA/nonischemic cardiomyopathy: Felt to be secondary to nonischemic cardiomyopathy. Ejection fraction confirmed decreased at 40%. We are continuing to diurese him. He is down now 12 L . His weight today is down to 305, approximately 29 pounds. Cardiology deciding about placement of defibrillator    Acute respiratory failure with hypoxemia: Secondary to acute systolic congestive heart failure. Resolved. Patient now on room air    Essential hypertension: Continue  antihypertensives . Blood pressure excellent     acute kidney insult in the setting of CKD (chronic kidney disease) stage 2, GFR 60-89 ml/min from diabetes mellitus: Holding on diuretics for now. Creatinine remained steady at 1.28. Monitor while diuresing.   diabetes mellitus with renal disease: sliding scale only for now, holding oral medications. CBGs stable, 160 or below,  Morbid obesity: Patient is criteria with BMI greater than 40    Code Status: full code   Family Communication: Both of his sons bedside  Disposition Plan: Home once fully diuresed decision made for defibrillator, discharge either tomorrow or Wednesday   Consultants:  Critical care  Cardiology   Procedures:   Cardiac catheterization plan 3/18: Mild disease, no evidence of obstruction, confirmed EF of 40%  intubation 3/13-3/15   echocardiogram done 3/13: Left bundle branch block, decreased ejection fraction to 40%  Lower extremity Dopplers done 3/14: No evidence of DVT  Antibiotics:   None   Objective: BP 118/68 mmHg  Pulse 96  Temp(Src) 99.6 F (37.6 C) (Oral)  Resp 18  Ht 6' (1.829 m)  Wt 138.6 kg (305 lb 8.9 oz)  BMI 41.43 kg/m2  SpO2 96%  Intake/Output Summary (Last 24 hours) at 06/15/14 1724 Last data filed at 06/15/14 1600  Gross per 24 hour  Intake    120 ml  Output    950 ml  Net   -830 ml   Filed Weights   06/13/14 0514 06/14/14 0318 06/15/14 0404  Weight: 145.3 kg (320 lb 5.3 oz) 140.8 kg (310 lb 6.5 oz) 138.6 kg (305 lb 8.9 oz)    Exam: Unchanged from previous day  General:  Alert and oriented 3, no acute distress  Cardiovascular:  regular rate and rhythm, O6-V6, 2/6 systolic ejection murmur  Respiratory: Clear to auscultation bilaterally  Abdomen: soft, nontender, nondistended, positive bowel sounds  Musculoskeletal: no clubbing or cyanosis, 1+ pitting edema from the knees down   Data Reviewed: Basic Metabolic Panel:  Recent Labs Lab 06/09/14 0535  06/10/14 0400 06/11/14 0521 06/12/14 0600 06/13/14 0520 06/14/14 0551  NA 145 146* 144 144 142 142  K 4.3 3.5 3.7 3.7 3.7 3.5  CL 105 107 106 108 107 104  CO2 28 27 28 31 30 31   GLUCOSE 113* 104* 129* 130* 138* 125*  BUN 33* 39* 39* 28* 20 16  CREATININE 1.21 1.42* 1.44* 1.28 1.26 1.21  CALCIUM 8.7 9.0 9.0 8.6 8.7 8.8  MG 2.1 1.9 2.1  --   --   --   PHOS 4.8* 4.8* 2.2*  --   --   --    Liver Function Tests: No results for input(s): AST, ALT, ALKPHOS, BILITOT, PROT, ALBUMIN in the last 168 hours. No results for input(s): LIPASE, AMYLASE in the last 168 hours. No results for input(s): AMMONIA in the last 168 hours. CBC:  Recent Labs Lab 06/11/14 0521 06/12/14 0600 06/13/14 0520 06/14/14 0551 06/15/14 0450  WBC 6.3 7.6 5.1 5.0 4.8  HGB 11.0* 9.5* 10.1* 9.7* 10.5*  HCT 34.4* 30.1* 31.7* 30.1* 31.9*  MCV 95.3 94.4 95.5 95.0 94.7  PLT 175 153 156 150 200   Cardiac Enzymes:   No results for input(s): CKTOTAL, CKMB, CKMBINDEX, TROPONINI in the last 168 hours. BNP (last 3 results) No results for input(s): BNP in the last 8760 hours.  ProBNP (last 3 results) No results for input(s): PROBNP in the last 8760 hours.  CBG:  Recent Labs Lab 06/14/14 1120 06/14/14 1622 06/14/14 2125 06/15/14 0638 06/15/14 1103  GLUCAP 166* 132* 123* 142* 150*    Recent Results (from the past 240 hour(s))  MRSA PCR Screening     Status: None   Collection Time: 06/07/14  5:09 PM  Result Value Ref Range Status   MRSA by PCR NEGATIVE NEGATIVE Final    Comment:        The GeneXpert MRSA Assay (FDA approved for NASAL specimens only), is one component of a comprehensive MRSA colonization surveillance program. It is not intended to diagnose MRSA infection nor to guide or monitor treatment for MRSA infections.      Studies: No results found.  Scheduled Meds: . amLODipine  10 mg Oral Daily  . antiseptic oral rinse  7 mL Mouth Rinse BID  . aspirin  325 mg Per Tube Daily  .  famotidine  20 mg Per Tube BID  . furosemide  40 mg Oral BID  . heparin subcutaneous  5,000 Units Subcutaneous 3 times per day  . insulin aspart  0-5 Units Subcutaneous QHS  . insulin aspart  0-9 Units Subcutaneous TID WC  . levothyroxine  75 mcg Per Tube Daily  . Living Better with Heart Failure Book   Does not apply Once  . rosuvastatin  10 mg Per Tube q1800  . tamsulosin  0.4 mg Oral Daily     Continuous Infusions: . sodium chloride 10 mL/hr at 06/10/14 2301     Time spent: 15 minutes  [facility] Pager [phone]. If 7PM-7AM, please contact night-coverage at [email or web address], password [facility] 06/15/2014, 5:24 PM  LOS: 8 days

## 2014-06-15 NOTE — Progress Notes (Signed)
Report given to Heather, RN

## 2014-06-15 NOTE — Progress Notes (Signed)
Physical Therapy Treatment Patient Details Name: Rickey Rice MRN: [id number] DOB: 11/29/1940 Today's Date: 06/15/2014    History of Present Illness Pt adm with cardiac arrest. Extubated 3/15. PMH - Bil TKA, obesity, DM, HTN, LBBB    PT Comments    Pt with continued progression with mobility able to ambulate with and without DME and perform stairs. Pt with goals met but will see again postop (planned defibrillator placement) to make sure pt can continue to perform mod I activity with UE restrictions post op. Pt encouraged to continue to perform ambulation and HEP daily. Will follow post op for safe D/C.  Follow Up Recommendations  No PT follow up     Equipment Recommendations  None recommended by PT    Recommendations for Other Services       Precautions / Restrictions Precautions Precautions: Fall Restrictions Weight Bearing Restrictions: No    Mobility  Bed Mobility Overal bed mobility: Modified Independent                Transfers Overall transfer level: Modified independent                  Ambulation/Gait Ambulation/Gait assistance: Modified independent (Device/Increase time) Ambulation Distance (Feet): 500 Feet Assistive device: 4-wheeled walker;None Gait Pattern/deviations: WFL(Within Functional Limits)   Gait velocity interpretation: at or above normal speed for age/gender General Gait Details: pt with smooth controlled gait walked half distance with rollator and half without without any balance deficitis   Stairs Stairs: Yes Stairs assistance: Supervision Stair Management: One rail Right;Step to pattern;Sideways;Forwards Number of Stairs: 5 General stair comments: stairs trialed x 2 with forward and sideways technique  Wheelchair Mobility    Modified Rankin (Stroke Patients Only)       Balance                                    Cognition Arousal/Alertness: Awake/alert Behavior During Therapy: WFL for tasks  assessed/performed Overall Cognitive Status: Within Functional Limits for tasks assessed                      Exercises General Exercises - Lower Extremity Hip ABduction/ADduction: AROM;Both;15 reps;Standing Hip Flexion/Marching: AROM;Both;15 reps;Standing Other Exercises Other Exercises: standing hip extension bil 15 each AROM Other Exercises: standing partial lunge bil x 10 AROM    General Comments        Pertinent Vitals/Pain Pain Assessment: No/denies pain    Home Living                      Prior Function            PT Goals (current goals can now be found in the care plan section) Progress towards PT goals: Goals met and updated - see care plan    Frequency       PT Plan Discharge plan needs to be updated    Co-evaluation             End of Session   Activity Tolerance: Patient tolerated treatment well Patient left: in chair;with call bell/phone within reach;with family/visitor present     Time: 1007-1036 PT Time Calculation (min) (ACUTE ONLY): 29 min  Charges:  $Gait Training: 8-22 mins $Therapeutic Exercise: 8-22 mins                    G Codes:  Lanetta Inch Beth 06/15/2014, 10:43 AM [name], [place]

## 2014-06-15 NOTE — Progress Notes (Signed)
Medicare Important Message given? YES  (If response is "NO", the following Medicare IM given date fields will be blank)  Date Medicare IM given: 06/15/14 Medicare IM given by:  Venicia Vandall  

## 2014-06-15 NOTE — Telephone Encounter (Signed)
Pt called in stating that he is currently in [facility] because he coded yesterday. He would like to speak with Dr. Debara Pickett if he could. Please f/u with pt  Thanks

## 2014-06-16 DIAGNOSIS — I4901 Ventricular fibrillation: Principal | ICD-10-CM

## 2014-06-16 LAB — BASIC METABOLIC PANEL
Anion gap: 9 (ref 5–15)
BUN: 24 mg/dL — AB (ref 6–23)
CALCIUM: 9 mg/dL (ref 8.4–10.5)
CO2: 32 mmol/L (ref 19–32)
CREATININE: 1.51 mg/dL — AB (ref 0.50–1.35)
Chloride: 101 mmol/L (ref 96–112)
GFR calc Af Amer: 51 mL/min — ABNORMAL LOW (ref >90)
GFR, EST NON AFRICAN AMERICAN: 44 mL/min — AB (ref >90)
GLUCOSE: 113 mg/dL — AB (ref 70–99)
Potassium: 4.1 mmol/L (ref 3.5–5.1)
SODIUM: 142 mmol/L (ref 135–145)

## 2014-06-16 LAB — GLUCOSE, CAPILLARY
GLUCOSE-CAPILLARY: 141 mg/dL — AB (ref 70–99)
GLUCOSE-CAPILLARY: 132 mg/dL — AB (ref 70–99)
Glucose-Capillary: 132 mg/dL — ABNORMAL HIGH (ref 70–99)
Glucose-Capillary: 115 mg/dL — ABNORMAL HIGH (ref 70–99)

## 2014-06-16 LAB — CBC
HCT: 31.5 % — ABNORMAL LOW (ref 39.0–52.0)
Hemoglobin: 10 g/dL — ABNORMAL LOW (ref 13.0–17.0)
MCH: 30 pg (ref 26.0–34.0)
MCHC: 31.7 g/dL (ref 30.0–36.0)
MCV: 94.6 fL (ref 78.0–100.0)
Platelets: 221 10*3/uL (ref 150–400)
RBC: 3.33 MIL/uL — ABNORMAL LOW (ref 4.22–5.81)
RDW: 13.7 % (ref 11.5–15.5)
WBC: 4.8 10*3/uL (ref 4.0–10.5)

## 2014-06-16 NOTE — Progress Notes (Signed)
SUBJECTIVE: The patient is doing well today.  At this time, he denies chest pain, shortness of breath, or any new concerns.  Pending CRTD implant today.   CURRENT MEDICATIONS: . antiseptic oral rinse  7 mL Mouth Rinse BID  . carvedilol  6.25 mg Oral BID WC  .  ceFAZolin (ANCEF) IV  3 g Intravenous On Call  . celecoxib  200 mg Oral Daily  . chlorhexidine  60 mL Topical Once  . chlorhexidine  60 mL Topical Once  . furosemide  40 mg Oral BID  . gentamicin irrigation  80 mg Irrigation On Call  . insulin aspart  0-5 Units Subcutaneous QHS  . insulin aspart  0-9 Units Subcutaneous TID WC  . levothyroxine  75 mcg Per Tube Daily  . lisinopril  10 mg Oral Daily  . Living Better with Heart Failure Book   Does not apply Once  . rosuvastatin  10 mg Per Tube q1800  . [START ON 06/17/2014] spironolactone  12.5 mg Oral Daily  . tamsulosin  0.4 mg Oral Daily   . sodium chloride 10 mL/hr at 06/10/14 2301  . sodium chloride    . sodium chloride      OBJECTIVE: Physical Exam: Filed Vitals:   06/15/14 1027 06/15/14 1106 06/15/14 1947 06/16/14 0444  BP:  118/68 101/57 95/56  Pulse: 108 96 83 72  Temp:   99.7 F (37.6 C) 99.8 F (37.7 C)  TempSrc:   Oral Oral  Resp:   16 16  Height:      Weight:    302 lb 14.6 oz (137.4 kg)  SpO2:   96% 95%    Intake/Output Summary (Last 24 hours) at 06/16/14 0931 Last data filed at 06/15/14 1850  Gross per 24 hour  Intake    390 ml  Output    600 ml  Net   -210 ml    Telemetry reveals sinus rhythm with occasional PVC's  GEN- The patient is well appearing, alert and oriented x 3 today.   Head- normocephalic, atraumatic Eyes-  Sclera clear, conjunctiva pink Ears- hearing intact Oropharynx- clear Neck- supple, no JVP Lungs- Clear to ausculation bilaterally, normal work of breathing Heart- Regular rate and rhythm, no murmurs, rubs or gallops, PMI not laterally displaced GI- soft, NT, ND, + BS Extremities- no clubbing, cyanosis, or  edema Skin- L chest subclavian CVL is in place and not very clean appearing Psych- euthymic mood, full affect Neuro- strength and sensation are intact  LABS: Basic Metabolic Panel:  Recent Labs  06/14/14 0551  NA 142  K 3.5  CL 104  CO2 31  GLUCOSE 125*  BUN 16  CREATININE 1.21  CALCIUM 8.8   CBC:  Recent Labs  06/15/14 0450 06/16/14 0541  WBC 4.8 4.8  HGB 10.5* 10.0*  HCT 31.9* 31.5*  MCV 94.7 94.6  PLT 200 221   ASSESSMENT AND PLAN:  Active Problems:   PEA (Pulseless electrical activity)   Anoxic encephalopathy   Cardiac arrest   Acute respiratory failure with hypoxemia   Congestive dilated cardiomyopathy   Essential hypertension   Hypertension   CKD (chronic kidney disease) stage 2, GFR 60-89 ml/min   DM type 2 causing renal disease   BPH (benign prostatic hypertrophy)   Obesity, morbid  1.  VF arrest Primary VF arrest in the setting of non-ischemic cardiomyopathy and LBBB I agree with Dr [name] that he meets criteria for BiV ICD implant for secondary prevention of sudden death.  Plan for CRTD implant. Will need central line removed >24 hours prior to implant to decrease risk for infection No driving X6 months (pt aware). Plan is to remove CVL today and then reassess.  Probably will need to wait until Thurs for device implant.  2.  Non-ischemic cardiomyopathy/congestive heart failure Continue Coreg, Lasix, Lisinopril, Spironolactone Euvolemic on exam today  3.  Acute encephalopathy Improving Per primary team  4.  HTN Stable No change required today  5.  CKD stage 2 Creat improved from admission Will check BMET today Will need to follow closely with addition of sprinolactone  Thompson Grayer MD, [facility] 06/16/2014 10:38 AM

## 2014-06-16 NOTE — Progress Notes (Signed)
PROGRESS NOTE  Rickey Rice INO:[id number] DOB: Jul 04, 1940 DOA: 06/07/2014 PCP: Elyn Peers, MD  HPI/Recap of past 24 hours: Patient is a 74 year old male with past medical history of BPH, diabetes mellitus with stage II chronic kidney disease and central hypertension who was admitted on 3/13 after suffering a cardiac arrest initially with pulseless electrical activity and then ventricular fibrillation. Patient was able to be resuscitated and was awakened emergency room, but then intubated after he removed his airway. Over the next few days, patient able to be weaned off of pressor support and extubated. His mentation continues to improve. Cardiology consulted and echocardiogram noted diffuse hypokinesis with ejection fraction of 40%. DVT ruled out. Patient's CT angiogram noted no evidence of PE but was poor quality. A VQ scan was found to have low probability.   Patient underwent cardiac catheterization 3/18 which noted some mild disease, but nothing to account for this degree of heart failure and secondary arrhythmia. Felt to be nonischemic cardiomyopathy. Patient otherwise with no complaints. He has diuresed 15 L and is down over 20 pounds.  Plan is for defibrillator placement. This initially was scheduled for today, 3/22, but was deferred as patient does have prolonged central line. I have removed his subclavian central line myself today. Defibrillator likely will be deferred until tomorrow or Thursday.   Assessment/Plan: Active Problems:    Anoxic encephalopathy: Discussed with wife and son. For the most part, patient's mentation appears intact although at times he has some off judgment and problems with remembering things in the past week. He continues to remain appropriate with me    Cardiac arrest with PEA/nonischemic cardiomyopathy: Felt to be secondary to nonischemic cardiomyopathy. Ejection fraction confirmed decreased at 40%. We are continuing to diurese him. Down 15 L and over 20  pounds. Plan for defibrillator on Wednesday or Thursday    Acute respiratory failure with hypoxemia: Secondary to acute systolic congestive heart failure. Resolved. Patient now on room air    Essential hypertension: Continue antihypertensives . Blood pressure excellent     acute kidney insult in the setting of CKD (chronic kidney disease) stage 2, GFR 60-89 ml/min from diabetes mellitus: Creatinine with mild bump today to 1.5. May be reaching end of diuresis. Recheck tomorrow and if further elevated, would decrease Lasix further. Cardiology cannot manage this.   diabetes mellitus with renal disease: sliding scale only for now, holding oral medications. CBGs continue to remain stable  Morbid obesity: Patient is criteria with BMI greater than 40    Code Status: full code   Family Communication: Wife at the bedside  Disposition Plan: Home once defibrillator placed, later this week   Consultants:  Critical care  Cardiology   Procedures:   Cardiac catheterization plan 3/18: Mild disease, no evidence of obstruction, confirmed EF of 40%  intubation 3/13-3/15   echocardiogram done 3/13: Left bundle branch block, decreased ejection fraction to 40%  Lower extremity Dopplers done 3/14: No evidence of DVT  Antibiotics:   None   Objective: BP 104/50 mmHg  Pulse 70  Temp(Src) 98.3 F (36.8 C) (Oral)  Resp 18  Ht 6' (1.829 m)  Wt 137.4 kg (302 lb 14.6 oz)  BMI 41.07 kg/m2  SpO2 100%  Intake/Output Summary (Last 24 hours) at 06/16/14 1829 Last data filed at 06/16/14 1630  Gross per 24 hour  Intake    990 ml  Output 2550.5 ml  Net -1560.5 ml   Filed Weights   06/14/14 0318 06/15/14 0404 06/16/14 0444  Weight:  140.8 kg (310 lb 6.5 oz) 138.6 kg (305 lb 8.9 oz) 137.4 kg (302 lb 14.6 oz)    Exam:   General:  Alert and oriented 3, no acute distress  Cardiovascular: regular rate and rhythm, R0-Q7, 2/6 systolic ejection murmur  Respiratory: Clear to auscultation  bilaterally  Abdomen: soft, nontender, nondistended, positive bowel sounds  Musculoskeletal: no clubbing or cyanosis,  trace  pitting edema from the knees down   Data Reviewed: Basic Metabolic Panel:  Recent Labs Lab 06/10/14 0400 06/11/14 0521 06/12/14 0600 06/13/14 0520 06/14/14 0551 06/16/14 1025  NA 146* 144 144 142 142 142  K 3.5 3.7 3.7 3.7 3.5 4.1  CL 107 106 108 107 104 101  CO2 27 28 31 30 31  32  GLUCOSE 104* 129* 130* 138* 125* 113*  BUN 39* 39* 28* 20 16 24*  CREATININE 1.42* 1.44* 1.28 1.26 1.21 1.51*  CALCIUM 9.0 9.0 8.6 8.7 8.8 9.0  MG 1.9 2.1  --   --   --   --   PHOS 4.8* 2.2*  --   --   --   --    Liver Function Tests: No results for input(s): AST, ALT, ALKPHOS, BILITOT, PROT, ALBUMIN in the last 168 hours. No results for input(s): LIPASE, AMYLASE in the last 168 hours. No results for input(s): AMMONIA in the last 168 hours. CBC:  Recent Labs Lab 06/12/14 0600 06/13/14 0520 06/14/14 0551 06/15/14 0450 06/16/14 0541  WBC 7.6 5.1 5.0 4.8 4.8  HGB 9.5* 10.1* 9.7* 10.5* 10.0*  HCT 30.1* 31.7* 30.1* 31.9* 31.5*  MCV 94.4 95.5 95.0 94.7 94.6  PLT 153 156 150 200 221   Cardiac Enzymes:   No results for input(s): CKTOTAL, CKMB, CKMBINDEX, TROPONINI in the last 168 hours. BNP (last 3 results) No results for input(s): BNP in the last 8760 hours.  ProBNP (last 3 results) No results for input(s): PROBNP in the last 8760 hours.  CBG:  Recent Labs Lab 06/15/14 1614 06/15/14 2105 06/16/14 0606 06/16/14 1119 06/16/14 1549  GLUCAP 133* 153* 132* 141* 132*    Recent Results (from the past 240 hour(s))  MRSA PCR Screening     Status: None   Collection Time: 06/07/14  5:09 PM  Result Value Ref Range Status   MRSA by PCR NEGATIVE NEGATIVE Final    Comment:        The GeneXpert MRSA Assay (FDA approved for NASAL specimens only), is one component of a comprehensive MRSA colonization surveillance program. It is not intended to diagnose  MRSA infection nor to guide or monitor treatment for MRSA infections.      Studies: No results found.  Scheduled Meds: . antiseptic oral rinse  7 mL Mouth Rinse BID  . carvedilol  6.25 mg Oral BID WC  .  ceFAZolin (ANCEF) IV  3 g Intravenous On Call  . celecoxib  200 mg Oral Daily  . chlorhexidine  60 mL Topical Once  . chlorhexidine  60 mL Topical Once  . furosemide  40 mg Oral BID  . gentamicin irrigation  80 mg Irrigation On Call  . insulin aspart  0-5 Units Subcutaneous QHS  . insulin aspart  0-9 Units Subcutaneous TID WC  . levothyroxine  75 mcg Per Tube Daily  . lisinopril  10 mg Oral Daily  . Living Better with Heart Failure Book   Does not apply Once  . rosuvastatin  10 mg Per Tube q1800  . [START ON 06/17/2014] spironolactone  12.5 mg Oral  Daily  . tamsulosin  0.4 mg Oral Daily     Continuous Infusions: . sodium chloride 10 mL/hr at 06/10/14 2301  . sodium chloride    . sodium chloride       Time spent: 15 minutes  [facility] Pager [phone]. If 7PM-7AM, please contact night-coverage at [email or web address], password [facility] 06/16/2014, 6:29 PM  LOS: 9 days

## 2014-06-16 NOTE — Telephone Encounter (Signed)
Spoke with Dr. Debara Pickett and he was going to stop by and see patient in hospital

## 2014-06-17 LAB — BASIC METABOLIC PANEL
Anion gap: 3 — ABNORMAL LOW (ref 5–15)
BUN: 29 mg/dL — ABNORMAL HIGH (ref 6–23)
CALCIUM: 8.8 mg/dL (ref 8.4–10.5)
CHLORIDE: 102 mmol/L (ref 96–112)
CO2: 36 mmol/L — ABNORMAL HIGH (ref 19–32)
CREATININE: 1.61 mg/dL — AB (ref 0.50–1.35)
GFR calc Af Amer: 47 mL/min — ABNORMAL LOW (ref >90)
GFR, EST NON AFRICAN AMERICAN: 41 mL/min — AB (ref >90)
GLUCOSE: 130 mg/dL — AB (ref 70–99)
POTASSIUM: 3.8 mmol/L (ref 3.5–5.1)
Sodium: 141 mmol/L (ref 135–145)

## 2014-06-17 LAB — CBC
HEMATOCRIT: 32.3 % — AB (ref 39.0–52.0)
HEMOGLOBIN: 10.4 g/dL — AB (ref 13.0–17.0)
MCH: 30.4 pg (ref 26.0–34.0)
MCHC: 32.2 g/dL (ref 30.0–36.0)
MCV: 94.4 fL (ref 78.0–100.0)
PLATELETS: 250 10*3/uL (ref 150–400)
RBC: 3.42 MIL/uL — ABNORMAL LOW (ref 4.22–5.81)
RDW: 14 % (ref 11.5–15.5)
WBC: 5.3 10*3/uL (ref 4.0–10.5)

## 2014-06-17 LAB — GLUCOSE, CAPILLARY
GLUCOSE-CAPILLARY: 149 mg/dL — AB (ref 70–99)
Glucose-Capillary: 135 mg/dL — ABNORMAL HIGH (ref 70–99)
Glucose-Capillary: 154 mg/dL — ABNORMAL HIGH (ref 70–99)
Glucose-Capillary: 108 mg/dL — ABNORMAL HIGH (ref 70–99)

## 2014-06-17 MED ORDER — SODIUM CHLORIDE 0.9 % IV SOLN: INTRAVENOUS | Status: DC

## 2014-06-17 MED ORDER — DEXTROSE 5 % IV SOLN
3.0000 g | INTRAVENOUS | Status: DC
  Filled 2014-06-17 (×3): qty 3000

## 2014-06-17 MED ORDER — CHLORHEXIDINE GLUCONATE 4 % EX LIQD
60.0000 mL | Freq: Once | CUTANEOUS | Status: DC
  Filled 2014-06-17: qty 60

## 2014-06-17 MED ORDER — GENTAMICIN SULFATE 40 MG/ML IJ SOLN
80.0000 mg | INTRAMUSCULAR | Status: DC
  Filled 2014-06-17 (×3): qty 2

## 2014-06-17 NOTE — Plan of Care (Signed)
Problem: Discharge Progression Outcomes Goal: Barriers To Progression Addressed/Resolved Outcome: Progressing For BiV ICD this week

## 2014-06-17 NOTE — Progress Notes (Signed)
[facility]                                                                              Patient Demographics  Rickey Rice, is a 74 y.o. male, DOB - 1940/04/30, OZH:[id number]  Admit date - 06/07/2014   Admitting Physician Wilhelmina Mcardle, MD  Outpatient Primary MD for the patient is Elyn Peers, MD  LOS - 10   Chief Complaint  Patient presents with  . Cardiac Arrest      HPI on 06/07/2014 by Dr. Merton Border (ICU) 82 M with obesity, prior bilateral knee surgeries, chronic LBBB, mild cardiomyopathy suffered cardiac arrest while attending church. Initial rhythm was PEA. Subsequently VF. ROSC in approx 15 mins. Was purposeful in ED but required intubation after he removed [facility] airway. Code cool called and PCCM admitted  Interim history Patient was able to be resuscitated and was awakened emergency room, but then intubated after he removed his airway. Over the next few days, patient able to be weaned off of pressor support and extubated. His mentation continues to improve. Cardiology consulted and echocardiogram noted diffuse hypokinesis with ejection fraction of 40%. DVT ruled out. Patient's CT angiogram noted no evidence of PE but was poor quality. A VQ scan was found to have low probability.  Patient underwent cardiac catheterization 3/18 which noted some mild disease, but nothing to account for this degree of heart failure and secondary arrhythmia. Felt to be nonischemic cardiomyopathy. Patient otherwise with no complaints. He has diuresed 15 L and is down over 20 pounds. Plan is for defibrillator placement. This initially was scheduled for today, 3/22, but was deferred as patient does have prolonged central line. I have removed his subclavian central line myself today. Defibrillator likely will be deferred until Thursday.  Assessment & Plan  Cardiac arrest with PEA/nonischemic cardiomyopathy -Felt to be secondary to nonischemic cardiomyopathy.  -EF 40% -Continue diuresing;  Down 15 L and over 20 pounds.  -Cardiology consulted and appreciated -Plan for defibrillator Thursday  Anoxic encephalopathy:  -Patient's mentation appears intact -Likely secondary to above  Acute respiratory failure with hypoxemia -Resolved, Secondary to acute systolic CHF.  -Currently on room air  Essential hypertension -Stable, well controlled -Continue Coreg, Lasix, lisinopril, spironolactone  Acute on chronic kidney disease stage 2 -Creatinine with mild bump today to 1.5.  -pending Cr today -Complicated by diuresis  Diabetes mellitus with renal disease -Continue ISS and CBG monitoring  -Home meds held  Morbid obesity -BMI >40 -Discuss labs, lifestyle modifications with PCP upon discharge  Normocytic Anemia -Baseline Hb 10-11 -Currently 10.4, continue to monitor  Hypothyroidism -Continue Synthroid  Code Status: Full  Family Communication: Wife at bedside  Disposition Plan: Admitted, pending defibrillator placement Thursday, 06/18/2014  Time Spent in minutes   30 minutes  Procedures   Cardiac catheterization plan 3/18: Mild disease, no evidence of obstruction, confirmed EF of 40%  intubation 3/13-3/15  echocardiogram done 3/13: Left bundle branch block, decreased ejection fraction to 40%  Lower extremity Dopplers done 3/14: No evidence of DVT  Consults   Critical care Cardiology  DVT Prophylaxis  SCDs  Lab Results  Component Value Date   PLT 250 06/17/2014    Medications  Scheduled Meds: . antiseptic oral rinse  7 mL Mouth Rinse BID  . carvedilol  6.25 mg Oral BID WC  . celecoxib  200 mg Oral Daily  . furosemide  40 mg Oral BID  . insulin aspart  0-5 Units Subcutaneous QHS  . insulin aspart  0-9 Units Subcutaneous TID WC  . levothyroxine  75 mcg Per Tube Daily  . lisinopril  10 mg Oral Daily  . Living Better with Heart Failure Book   Does not apply Once  . rosuvastatin  10 mg Per Tube q1800  . spironolactone  12.5 mg Oral Daily  .  tamsulosin  0.4 mg Oral Daily   Continuous Infusions: . sodium chloride 10 mL/hr at 06/10/14 2301  . sodium chloride    . sodium chloride     PRN Meds:.albuterol, fentaNYL, menthol-cetylpyridinium, RESOURCE THICKENUP CLEAR, sodium chloride, white petrolatum  Antibiotics    Anti-infectives    Start     Dose/Rate Route Frequency Ordered Stop   06/15/14 1845  gentamicin (GARAMYCIN) 80 mg in sodium chloride irrigation 0.9 % 500 mL irrigation     80 mg Irrigation On call 06/15/14 1834 06/16/14 1845   06/15/14 1845  ceFAZolin (ANCEF) 3 g in dextrose 5 % 50 mL IVPB     3 g 160 mL/hr over 30 Minutes Intravenous On call 06/15/14 1834 06/16/14 1845      Subjective:   Rickey Rice seen and examined today.  Patient has no complaints.  Denies chest pain, shortness of breath, abdominal pain, dizziness, headache.   Objective:   Filed Vitals:   06/16/14 1336 06/16/14 1942 06/17/14 0447 06/17/14 0851  BP: 104/50 91/62 104/57 113/65  Pulse: 70 75 73 83  Temp: 98.3 F (36.8 C) 99.1 F (37.3 C) 98.4 F (36.9 C)   TempSrc: Oral Oral Oral   Resp: 18 18 18    Height:      Weight:   139.164 kg (306 lb 12.8 oz)   SpO2: 100% 97% 95%     Wt Readings from Last 3 Encounters:  06/17/14 139.164 kg (306 lb 12.8 oz)  02/03/14 151.501 kg (334 lb)  01/26/14 152.862 kg (337 lb)     Intake/Output Summary (Last 24 hours) at 06/17/14 1337 Last data filed at 06/16/14 1630  Gross per 24 hour  Intake    480 ml  Output    800 ml  Net   -320 ml    Exam  General: Well developed, well nourished, NAD  HEENT: NCAT,  mucous membranes moist.   Cardiovascular: S1 S2 auscultated, no rubs, murmurs or gallops. Regular rate and rhythm.  Respiratory: Clear to auscultation bilaterally with equal chest rise  Abdomen: Soft, obese, nontender, nondistended, + bowel sounds  Extremities: warm dry without cyanosis clubbing or edema  Neuro: AAOx3,  nonfocal   Psych: Normal affect and demeanor   Data  Review   Micro Results Recent Results (from the past 240 hour(s))  MRSA PCR Screening     Status: None   Collection Time: 06/07/14  5:09 PM  Result Value Ref Range Status   MRSA by PCR NEGATIVE NEGATIVE Final    Comment:        The GeneXpert MRSA Assay (FDA approved for NASAL specimens only), is one component of a comprehensive MRSA colonization surveillance program. It is not intended to diagnose MRSA infection nor to guide or monitor treatment for MRSA infections.     Radiology Reports Ct Angio Chest Pe W/cm &/or Wo Cm  06/07/2014  CLINICAL DATA:  74 year old male with witnessed cardiac arrest this morning, status post CPR.  EXAM: CT ANGIOGRAPHY CHEST WITH CONTRAST  TECHNIQUE: Multidetector CT imaging of the chest was performed using the standard protocol during bolus administration of intravenous contrast. Multiplanar CT image reconstructions and MIPs were obtained to evaluate the vascular anatomy.  CONTRAST:  182mL OMNIPAQUE IOHEXOL 350 MG/ML SOLN  COMPARISON:  No priors.  FINDINGS: Study is exceedingly limited for evaluation of pulmonary embolism secondary to patient's large body habitus, inability to images a patient with the arms in the up position, and suboptimal contrast bolus.  Mediastinum/Lymph Nodes: No central or lobar filling defect is noted to suggest large pulmonary embolism. Smaller segmental and subsegmental size filling defects cannot be excluded on today's examination secondary to the limitations discussed above. Pulmonic trunk is dilated measuring 4.1 cm in diameter, suggesting underlying pulmonary arterial hypertension. Cardiomegaly with left ventricular dilatation. There is atherosclerosis of the thoracic aorta, the great vessels of the mediastinum and the coronary arteries, including calcified atherosclerotic plaque in the left main, left anterior descending and right coronary arteries. There is no significant pericardial fluid, thickening or pericardial calcification.  No pathologically enlarged mediastinal or hilar lymph nodes. Esophagus is unremarkable in appearance. No axillary lymphadenopathy. Nasogastric tube extends at least to the stomach (it extends below the lower margin of the images). Endotracheal tube in position with tip terminating approximately 2.4 cm above the carina.  Lungs/Pleura: Dependent subsegmental atelectasis in the lower lobes of the lungs bilaterally (right greater than left). Small amount of dependent airspace consolidation in the posterior aspect of the right upper lobe and in the inferior right lower lobe which may suggest mild aspiration given the patient's history. No pneumothorax. No pleural effusions.  Upper Abdomen: Small calcified gallstone in the gallbladder. Diffuse low attenuation of the hepatic parenchyma, indicative of hepatic steatosis.  Musculoskeletal/Soft Tissues: No acute displaced fractures or aggressive appearing lytic or blastic lesions are noted in the visualized portions of the skeleton.  Review of the MIP images confirms the above findings.  IMPRESSION: 1. Exceedingly limited examination demonstrating no central or lobar sized pulmonary embolism. Segmental and subsegmental sized emboli cannot be excluded. 2. Dependent airspace consolidation in the posterior right upper lobe and dependent portion of the right lower lobe, likely sequela of mild aspiration. 3. Cardiomegaly with left ventricular dilatation. 4. Dilatation of the pulmonic trunk (4.1 cm in diameter), suggestive of pulmonary arterial hypertension. 5. Atherosclerosis, including left main and 2 vessel coronary artery disease. Assessment for potential risk factor modification, dietary therapy or pharmacologic therapy may be warranted, if clinically indicated. 6. Mild dependent subsegmental atelectasis in the lower lobes of the lungs bilaterally. 7. Hepatic steatosis. 8. Cholelithiasis. 9. Support apparatus, as above.   Electronically Signed   By: Vinnie Langton M.D.   On:  06/07/2014 16:33   Nm Pulmonary Perf And Vent  06/10/2014   CLINICAL DATA:  Difficulty breathing  EXAM: NUCLEAR MEDICINE VENTILATION - PERFUSION LUNG SCAN  Views: Anterior, posterior, RPO, LPO, RAO, LAO -ventilation and perfusion. Lateral images could not be obtained due to patient's size.  Radionuclide: Technetium 5m DTPA -ventilation; tTechnetium 87m macroaggregated albumin-perfusion  Dose:  40.0 mCi-ventilation; 6.0 mCi-perfusion  Route of administration: Inhalation -ventilation ; intravenous- perfusion  COMPARISON:  Chest radiograph April 10, 2014  FINDINGS: Ventilation: There is cardiomegaly. There are a few subsegmental ventilation defects. There is no segmental level ventilation defect.  Perfusion: There is cardiomegaly. There are a few small subsegmental perfusion defects which match ventilation  defects. There is no segmental perfusion defect. There is no significant ventilation/perfusion mismatch.  IMPRESSION: Small ventilation and perfusion defects without appreciable ventilation/ perfusion mismatch. Cardiomegaly. This study constitutes a low probability of pulmonary embolus.   Electronically Signed   By: Lowella Grip III M.D.   On: 06/10/2014 16:36   Dg Chest Port 1 View  06/09/2014   CLINICAL DATA:  History of endotracheal tube  EXAM: PORTABLE CHEST - 1 VIEW  COMPARISON:  06/07/2014  FINDINGS: Endotracheal tube in good position. Left subclavian central venous catheter tip in the SVC also unchanged. NG tube in the stomach  Hypoventilation with bibasilar atelectasis/ infiltrate left greater than right is unchanged from the prior study. Negative for edema or effusion.  IMPRESSION: Hypoventilation with bibasilar consolidation unchanged. No new findings. Support lines in satisfactory position.   Electronically Signed   By: Franchot Gallo M.D.   On: 06/09/2014 07:37   Dg Chest Port 1 View  06/07/2014   CLINICAL DATA:  Central line placement.  EXAM: PORTABLE CHEST - 1 VIEW  COMPARISON:   01/20/2014  FINDINGS: Endotracheal tube has tip 3.8 cm above the carina. Nasogastric tube courses into the region of the stomach and off the inferior portion of the film. Left subclavian central venous catheter is present with tip in the region of the SVC at the level of the carina. No evidence of pneumothorax.  Lungs are moderately hypoinflated with stable elevation of the right hemidiaphragm. Mild stable cardiomegaly. Remainder the exam is unchanged.  IMPRESSION: Hypoinflation without evidence of acute disease. Mild stable cardiomegaly.  Tubes and lines as described.   Electronically Signed   By: Marin Olp M.D.   On: 06/07/2014 17:30    CBC  Recent Labs Lab 06/13/14 0520 06/14/14 0551 06/15/14 0450 06/16/14 0541 06/17/14 0442  WBC 5.1 5.0 4.8 4.8 5.3  HGB 10.1* 9.7* 10.5* 10.0* 10.4*  HCT 31.7* 30.1* 31.9* 31.5* 32.3*  PLT 156 150 200 221 250  MCV 95.5 95.0 94.7 94.6 94.4  MCH 30.4 30.6 31.2 30.0 30.4  MCHC 31.9 32.2 32.9 31.7 32.2  RDW 14.1 13.9 13.6 13.7 14.0    Chemistries   Recent Labs Lab 06/11/14 0521 06/12/14 0600 06/13/14 0520 06/14/14 0551 06/16/14 1025  NA 144 144 142 142 142  K 3.7 3.7 3.7 3.5 4.1  CL 106 108 107 104 101  CO2 28 31 30 31  32  GLUCOSE 129* 130* 138* 125* 113*  BUN 39* 28* 20 16 24*  CREATININE 1.44* 1.28 1.26 1.21 1.51*  CALCIUM 9.0 8.6 8.7 8.8 9.0  MG 2.1  --   --   --   --    ------------------------------------------------------------------------------------------------------------------ estimated creatinine clearance is 63 mL/min (by C-G formula based on Cr of 1.51). ------------------------------------------------------------------------------------------------------------------ No results for input(s): HGBA1C in the last 72 hours. ------------------------------------------------------------------------------------------------------------------ No results for input(s): CHOL, HDL, LDLCALC, TRIG, CHOLHDL, LDLDIRECT in the last 72  hours. ------------------------------------------------------------------------------------------------------------------ No results for input(s): TSH, T4TOTAL, T3FREE, THYROIDAB in the last 72 hours.  Invalid input(s): FREET3 ------------------------------------------------------------------------------------------------------------------ No results for input(s): VITAMINB12, FOLATE, FERRITIN, TIBC, IRON, RETICCTPCT in the last 72 hours.  Coagulation profile  Recent Labs Lab 06/12/14 0445  INR 1.17    No results for input(s): DDIMER in the last 72 hours.  Cardiac Enzymes No results for input(s): CKMB, TROPONINI, MYOGLOBIN in the last 168 hours.  Invalid input(s): CK ------------------------------------------------------------------------------------------------------------------ Invalid input(s): POCBNP    Tysha Grismore D.O. on 06/17/2014 at 1:37 PM  Between 7am to 7pm - Pager -  [phone]  After 7pm go to [email or web address] - password [email or web address]  And look for the night coverage person covering for me after hours  [facility] Office  [phone]

## 2014-06-17 NOTE — Progress Notes (Signed)
NUTRITION FOLLOW UP  Intervention:   -Continue Magic Cup BID -Provided AND Nutrition Care Manual's "Heart Healthy Nutrition Therapy" handout and provided review of DM, Heart Healthy diet principles   Nutrition Dx:   Inadequate oral intake related to acute reversible dysphagia as evidenced by DYS 3 diet; progressing  Goal:   Pt to meet >/= 90% of estimated energy needs; met  Monitor:   PO intake, diet advancement, weight trends, labs  Assessment:   74 y/o male with past medical history of obesity, prior bilateral knee surgeries, chronic LBBB, mild cardiomyopathy suffered cardiac arrest while attending church.  Pt s/p cardiac cath on 06/12/14. Pt is awaiting defibrillator placement prior to discharge.  Pt was advanced to a Heart Healthy diet with thin liquids on 06/11/14. He reports good tolerance of diet without swallowing difficulties. Noted meal completion 50-100%. Pt reports that he usually does not clear his plate, but eats the majority of items on his tray. He really enjoys the [employer] and would like to continue receiving it.  Pt and wife have very good understanding of Heart Healthy/ DM diet principles. Both report heightened interest in diet compliance due to hospitalization. RD reviewed diet guidelines with both pt and wife and provided handouts for further reinforcement. Both expressed gratefulness for visit.  Noted wt loss due to excessive diuresis. Per cardiology, pt has diuresed 13 L.   Labs reviewed. BUN/Creat: 24/1.51, Phos: 2.2, Glucose: 113, CBGS: 115-135.  Height: Ht Readings from Last 1 Encounters:  06/07/14 6' (1.829 m)    Weight Status:   Wt Readings from Last 1 Encounters:  06/17/14 306 lb 12.8 oz (139.164 kg)   06/10/14 317 lb 9.5 oz (144.06 kg)       Re-estimated needs:  Kcal: 2100-2300 Protein: 105-115 grams Fluid: 2.1-2.3 L  Skin: WDL  Diet Order: Diet Heart   Intake/Output Summary (Last 24 hours) at 06/17/14 0927 Last data filed at 06/16/14  1630  Gross per 24 hour  Intake    720 ml  Output 1350.5 ml  Net -630.5 ml    Last BM: 06/13/14   Labs:   Recent Labs Lab 06/11/14 0521  06/13/14 0520 06/14/14 0551 06/16/14 1025  NA 144  < > 142 142 142  K 3.7  < > 3.7 3.5 4.1  CL 106  < > 107 104 101  CO2 28  < > 30 31 32  BUN 39*  < > 20 16 24*  CREATININE 1.44*  < > 1.26 1.21 1.51*  CALCIUM 9.0  < > 8.7 8.8 9.0  MG 2.1  --   --   --   --   PHOS 2.2*  --   --   --   --   GLUCOSE 129*  < > 138* 125* 113*  < > = values in this interval not displayed.  CBG (last 3)   Recent Labs  06/16/14 1549 06/16/14 2102 06/17/14 0605  GLUCAP 132* 115* 135*    Scheduled Meds: . antiseptic oral rinse  7 mL Mouth Rinse BID  . carvedilol  6.25 mg Oral BID WC  . celecoxib  200 mg Oral Daily  . furosemide  40 mg Oral BID  . insulin aspart  0-5 Units Subcutaneous QHS  . insulin aspart  0-9 Units Subcutaneous TID WC  . levothyroxine  75 mcg Per Tube Daily  . lisinopril  10 mg Oral Daily  . Living Better with Heart Failure Book   Does not apply Once  .  rosuvastatin  10 mg Per Tube q1800  . spironolactone  12.5 mg Oral Daily  . tamsulosin  0.4 mg Oral Daily    Continuous Infusions: . sodium chloride 10 mL/hr at 06/10/14 2301  . sodium chloride    . sodium chloride      Emmanuella Mirante A. Jimmye Norman, RD, LDN, CDE Pager: [phone] After hours Pager: [phone]

## 2014-06-17 NOTE — Progress Notes (Signed)
SUBJECTIVE: The patient is doing well today.  At this time, he denies chest pain, shortness of breath, or any new concerns.  Implant postponed because of indwelling [place] veinous line that I failed to appreciate  CURRENT MEDICATIONS: . antiseptic oral rinse  7 mL Mouth Rinse BID  . carvedilol  6.25 mg Oral BID WC  . celecoxib  200 mg Oral Daily  . chlorhexidine  60 mL Topical Once  . chlorhexidine  60 mL Topical Once  . furosemide  40 mg Oral BID  . insulin aspart  0-5 Units Subcutaneous QHS  . insulin aspart  0-9 Units Subcutaneous TID WC  . levothyroxine  75 mcg Per Tube Daily  . lisinopril  10 mg Oral Daily  . Living Better with Heart Failure Book   Does not apply Once  . rosuvastatin  10 mg Per Tube q1800  . spironolactone  12.5 mg Oral Daily  . tamsulosin  0.4 mg Oral Daily   . sodium chloride 10 mL/hr at 06/10/14 2301  . sodium chloride    . sodium chloride      OBJECTIVE: Physical Exam: Filed Vitals:   06/16/14 1000 06/16/14 1336 06/16/14 1942 06/17/14 0447  BP: 97/58 104/50 91/62 104/57  Pulse:  70 75 73  Temp:  98.3 F (36.8 C) 99.1 F (37.3 C) 98.4 F (36.9 C)  TempSrc:  Oral Oral Oral  Resp:  18 18 18   Height:      Weight:    306 lb 12.8 oz (139.164 kg)  SpO2:  100% 97% 95%    Intake/Output Summary (Last 24 hours) at 06/17/14 0737 Last data filed at 06/16/14 1630  Gross per 24 hour  Intake    720 ml  Output 2000.5 ml  Net -1280.5 ml    Telemetry reveals sinus rhythm with occasional PVC's  GEN- The patient is well appearing, alert and oriented x 3 today.   Head- normocephalic, atraumatic Eyes-  Sclera clear, conjunctiva pink Ears- hearing intact Oropharynx- clear Neck- supple, no JVP Lungs- Clear to ausculation bilaterally, normal work of breathing Heart- Regular rate and rhythm, no murmurs, rubs or gallops, PMI not laterally displaced GI- soft, NT, ND, + BS Extremities- no clubbing, cyanosis, or edema Skin- L chest subclavian site is  without erythema or tenderness Psych- euthymic mood, full affect Neuro- strength and sensation are intact  LABS: Basic Metabolic Panel:  Recent Labs  06/16/14 1025  NA 142  K 4.1  CL 101  CO2 32  GLUCOSE 113*  BUN 24*  CREATININE 1.51*  CALCIUM 9.0   CBC:  Recent Labs  06/16/14 0541 06/17/14 0442  WBC 4.8 5.3  HGB 10.0* 10.4*  HCT 31.5* 32.3*  MCV 94.6 94.4  PLT 221 250   ASSESSMENT AND PLAN:  Active Problems:   PEA (Pulseless electrical activity)   Anoxic encephalopathy   Cardiac arrest   Acute respiratory failure with hypoxemia   Congestive dilated cardiomyopathy   Essential hypertension   Hypertension   CKD (chronic kidney disease) stage 2, GFR 60-89 ml/min   DM type 2 causing renal disease   BPH (benign prostatic hypertrophy)   Obesity, morbid  VF arrest Primary VF arrest in the setting of non-ischemic cardiomyopathy and LBBB   Non-ischemic cardiomyopathy/congestive heart failure Continue Coreg, Lasix, Lisinopril, Spironolactone Euvolemic on exam today   CKD stage 2 Creat improved from admission Cr was increasing yesterday and will repeat today  We'll plan to proceed with CRT-D implantation for secondary prevention  Have reviewed the potential benefits and risks of ICD implantation including but not limited to death, perforation of heart or lung, lead dislodgement, infection,  device malfunction and inappropriate shocks.  The patient and family express understanding  and are willing to proceed.        06/17/2014 7:37 AM

## 2014-06-17 NOTE — Progress Notes (Signed)
PT Cancellation Note  Patient Details Name: Rickey Rice MRN: [id number] DOB: Jan 03, 1941   Cancelled Treatment:    Reason Eval/Treat Not Completed: Other (comment); noted per last PT note that no skilled PT needs until after defibrillator is placed.  Will cancel today and check on pt tomorrow to see after the defibrillator is placed due to UE precautions.   Neaveh Belanger,CYNDI 06/17/2014, 9:47 AM

## 2014-06-18 ENCOUNTER — Encounter (HOSPITAL_COMMUNITY)
Admission: EM | Disposition: A | Discharge status: Home / Self Care | Payer: Self-pay | Source: Home / Self Care | Attending: Internal Medicine

## 2014-06-18 ENCOUNTER — Encounter (HOSPITAL_COMMUNITY): Payer: Self-pay | Admitting: Internal Medicine

## 2014-06-18 DIAGNOSIS — I509 Heart failure, unspecified: Secondary | ICD-10-CM

## 2014-06-18 DIAGNOSIS — I429 Cardiomyopathy, unspecified: Secondary | ICD-10-CM

## 2014-06-18 DIAGNOSIS — I469 Cardiac arrest, cause unspecified: Secondary | ICD-10-CM

## 2014-06-18 HISTORY — PX: BI-VENTRICULAR IMPLANTABLE CARDIOVERTER DEFIBRILLATOR: SHX5459

## 2014-06-18 LAB — GLUCOSE, CAPILLARY
Glucose-Capillary: 113 mg/dL — ABNORMAL HIGH (ref 70–99)
Glucose-Capillary: 120 mg/dL — ABNORMAL HIGH (ref 70–99)
Glucose-Capillary: 118 mg/dL — ABNORMAL HIGH (ref 70–99)
Glucose-Capillary: 256 mg/dL — ABNORMAL HIGH (ref 70–99)
Glucose-Capillary: 214 mg/dL — ABNORMAL HIGH (ref 70–99)

## 2014-06-18 LAB — CBC
HEMATOCRIT: 33.2 % — AB (ref 39.0–52.0)
Hemoglobin: 10.6 g/dL — ABNORMAL LOW (ref 13.0–17.0)
MCH: 30.2 pg (ref 26.0–34.0)
MCHC: 31.9 g/dL (ref 30.0–36.0)
MCV: 94.6 fL (ref 78.0–100.0)
Platelets: 264 10*3/uL (ref 150–400)
RBC: 3.51 MIL/uL — ABNORMAL LOW (ref 4.22–5.81)
RDW: 13.7 % (ref 11.5–15.5)
WBC: 6.2 10*3/uL (ref 4.0–10.5)

## 2014-06-18 LAB — BASIC METABOLIC PANEL
Anion gap: 11 (ref 5–15)
BUN: 36 mg/dL — ABNORMAL HIGH (ref 6–23)
CALCIUM: 8.9 mg/dL (ref 8.4–10.5)
CO2: 28 mmol/L (ref 19–32)
CREATININE: 1.6 mg/dL — AB (ref 0.50–1.35)
Chloride: 101 mmol/L (ref 96–112)
GFR, EST AFRICAN AMERICAN: 48 mL/min — AB (ref >90)
GFR, EST NON AFRICAN AMERICAN: 41 mL/min — AB (ref >90)
GLUCOSE: 130 mg/dL — AB (ref 70–99)
POTASSIUM: 3.8 mmol/L (ref 3.5–5.1)
Sodium: 140 mmol/L (ref 135–145)

## 2014-06-18 SURGERY — BI-VENTRICULAR IMPLANTABLE CARDIOVERTER DEFIBRILLATOR  (CRT-D)

## 2014-06-18 MED ORDER — ACETAMINOPHEN 325 MG PO TABS: 325.0000 mg | ORAL_TABLET | ORAL | Status: DC | PRN

## 2014-06-18 MED ORDER — MIDAZOLAM HCL 5 MG/5ML IJ SOLN
INTRAMUSCULAR | Status: AC
  Filled 2014-06-18: qty 5

## 2014-06-18 MED ORDER — DIPHENHYDRAMINE HCL 50 MG/ML IJ SOLN
INTRAMUSCULAR | Status: AC
  Filled 2014-06-18: qty 1

## 2014-06-18 MED ORDER — SODIUM CHLORIDE 0.9 % IV SOLN
INTRAVENOUS | Status: AC
  Administered 2014-06-18: 15:00:00 via INTRAVENOUS

## 2014-06-18 MED ORDER — FENTANYL CITRATE 0.05 MG/ML IJ SOLN
INTRAMUSCULAR | Status: AC
  Filled 2014-06-18: qty 2

## 2014-06-18 MED ORDER — ONDANSETRON HCL 4 MG/2ML IJ SOLN: 4.0000 mg | Freq: Four times a day (QID) | INTRAMUSCULAR | Status: DC | PRN

## 2014-06-18 MED ORDER — LIDOCAINE HCL (PF) 1 % IJ SOLN
INTRAMUSCULAR | Status: AC
  Filled 2014-06-18: qty 60

## 2014-06-18 MED ORDER — HEPARIN (PORCINE) IN NACL 2-0.9 UNIT/ML-% IJ SOLN
INTRAMUSCULAR | Status: AC
  Filled 2014-06-18: qty 500

## 2014-06-18 MED ORDER — METHYLPREDNISOLONE SODIUM SUCC 125 MG IJ SOLR
INTRAMUSCULAR | Status: AC
  Filled 2014-06-18: qty 2

## 2014-06-18 MED ORDER — CEFAZOLIN SODIUM 1-5 GM-% IV SOLN
1.0000 g | Freq: Four times a day (QID) | INTRAVENOUS | Status: AC
  Administered 2014-06-18 – 2014-06-19 (×3): 1 g via INTRAVENOUS
  Filled 2014-06-18 (×4): qty 50

## 2014-06-18 MED ORDER — FAMOTIDINE IN NACL 20-0.9 MG/50ML-% IV SOLN
INTRAVENOUS | Status: AC
Start: 2014-06-18 — End: 2014-06-18
  Filled 2014-06-18: qty 50

## 2014-06-18 MED ORDER — CEFAZOLIN SODIUM-DEXTROSE 2-3 GM-% IV SOLR
INTRAVENOUS | Status: AC
  Filled 2014-06-18: qty 50

## 2014-06-18 NOTE — Progress Notes (Signed)
[facility]                                                                              Patient Demographics  Rickey Rice, is a 74 y.o. male, DOB - 03/11/1941, FKC:[id number]  Admit date - 06/07/2014   Admitting Physician Wilhelmina Mcardle, MD  Outpatient Primary MD for the patient is Elyn Peers, MD  LOS - 11   Chief Complaint  Patient presents with  . Cardiac Arrest      HPI on 06/07/2014 by Dr. Merton Border (ICU) 88 M with obesity, prior bilateral knee surgeries, chronic LBBB, mild cardiomyopathy suffered cardiac arrest while attending church. Initial rhythm was PEA. Subsequently VF. ROSC in approx 15 mins. Was purposeful in ED but required intubation after he removed [facility] airway. Code cool called and PCCM admitted  Interim history Patient was able to be resuscitated and was awakened emergency room, but then intubated after he removed his airway. Over the next few days, patient able to be weaned off of pressor support and extubated. His mentation continues to improve. Cardiology consulted and echocardiogram noted diffuse hypokinesis with ejection fraction of 40%. DVT ruled out. Patient's CT angiogram noted no evidence of PE but was poor quality. A VQ scan was found to have low probability.  Patient underwent cardiac catheterization 3/18 which noted some mild disease, but nothing to account for this degree of heart failure and secondary arrhythmia. Felt to be nonischemic cardiomyopathy. Patient otherwise with no complaints. He has diuresed 15 L and is down over 20 pounds. Plan is for defibrillator placement. This initially was scheduled for today, 3/22, but was deferred as patient does have prolonged central line. I have removed his subclavian central line myself today. Defibrillator to be placed 06/18/2014.  Assessment & Plan  Cardiac arrest with PEA/nonischemic cardiomyopathy -Felt to be secondary to nonischemic cardiomyopathy.  -EF 40% -Continue diuresing, lost  >25lbs -Cardiology consulted and appreciated -Defibrillator to be placed todya  Anoxic encephalopathy:  -Patient's mentation appears intact -Likely secondary to above  Acute respiratory failure with hypoxemia -Resolved, Secondary to acute systolic CHF.  -Currently on room air  Essential hypertension -Stable, well controlled -Continue Coreg, Lasix, lisinopril, spironolactone  Acute on chronic kidney disease stage 2 -Creatinine 1.6.  -Complicated by diuresis  Diabetes mellitus with renal disease -Continue ISS and CBG monitoring  -Home meds held  Morbid obesity -BMI >40 -Discuss labs, lifestyle modifications with PCP upon discharge  Normocytic Anemia -Baseline Hb 10-11 -Currently 10.6, continue to monitor  Hypothyroidism -Continue Synthroid  Code Status: Full  Family Communication: Son at bedside  Disposition Plan: Admitted, pending defibrillator placement today  06/18/2014  Time Spent in minutes   30 minutes  Procedures   Cardiac catheterization plan 3/18: Mild disease, no evidence of obstruction, confirmed EF of 40%  intubation 3/13-3/15  echocardiogram done 3/13: Left bundle branch block, decreased ejection fraction to 40%  Lower extremity Dopplers done 3/14: No evidence of DVT  ICD placement 06/18/2014  Consults   Critical care Cardiology  DVT Prophylaxis  SCDs  Lab Results  Component Value Date   PLT 264 06/18/2014    Medications  Scheduled Meds: . antiseptic oral rinse  7 mL  Mouth Rinse BID  . carvedilol  6.25 mg Oral BID WC  .  ceFAZolin (ANCEF) IV  3 g Intravenous On Call  . celecoxib  200 mg Oral Daily  . chlorhexidine  60 mL Topical Once  . chlorhexidine  60 mL Topical Once  . furosemide  40 mg Oral BID  . gentamicin irrigation  80 mg Irrigation On Call  . insulin aspart  0-5 Units Subcutaneous QHS  . insulin aspart  0-9 Units Subcutaneous TID WC  . levothyroxine  75 mcg Per Tube Daily  . lisinopril  10 mg Oral Daily  . Living  Better with Heart Failure Book   Does not apply Once  . rosuvastatin  10 mg Per Tube q1800  . spironolactone  12.5 mg Oral Daily  . tamsulosin  0.4 mg Oral Daily   Continuous Infusions: . sodium chloride 10 mL/hr at 06/10/14 2301  . sodium chloride    . sodium chloride     PRN Meds:.albuterol, fentaNYL, menthol-cetylpyridinium, RESOURCE THICKENUP CLEAR, sodium chloride, white petrolatum  Antibiotics    Anti-infectives    Start     Dose/Rate Route Frequency Ordered Stop   06/18/14 0959  ceFAZolin (ANCEF) 2-3 GM-% IVPB SOLR  Status:  Discontinued    Comments:  Moishe Spice   : cabinet override      06/18/14 0959 06/18/14 0959   06/17/14 1430  gentamicin (GARAMYCIN) 80 mg in sodium chloride irrigation 0.9 % 500 mL irrigation     80 mg Irrigation On call 06/17/14 1427 06/18/14 1430   06/17/14 1430  ceFAZolin (ANCEF) 3 g in dextrose 5 % 50 mL IVPB     3 g 160 mL/hr over 30 Minutes Intravenous On call 06/17/14 1427 06/18/14 1430   06/15/14 1845  gentamicin (GARAMYCIN) 80 mg in sodium chloride irrigation 0.9 % 500 mL irrigation     80 mg Irrigation On call 06/15/14 1834 06/16/14 1845   06/15/14 1845  ceFAZolin (ANCEF) 3 g in dextrose 5 % 50 mL IVPB     3 g 160 mL/hr over 30 Minutes Intravenous On call 06/15/14 1834 06/16/14 1845      Subjective:   Rickey Rice seen and examined today.  Patient has no complaints and is ready for his procedure.  Denies chest pain, shortness of breath, abdominal pain, dizziness, headache.   Objective:   Filed Vitals:   06/17/14 2036 06/18/14 0610 06/18/14 0756 06/18/14 1156  BP: 102/58 104/64 105/63 130/73  Pulse: 78 76 63 70  Temp: 98.1 F (36.7 C) 98.7 F (37.1 C)  97.5 F (36.4 C)  TempSrc: Oral Oral  Oral  Resp: 18 18    Height:      Weight:  138.166 kg (304 lb 9.6 oz)    SpO2: 100% 97%  99%    Wt Readings from Last 3 Encounters:  06/18/14 138.166 kg (304 lb 9.6 oz)  02/03/14 151.501 kg (334 lb)  01/26/14 152.862 kg (337 lb)      Intake/Output Summary (Last 24 hours) at 06/18/14 1209 Last data filed at 06/18/14 0900  Gross per 24 hour  Intake      0 ml  Output    800 ml  Net   -800 ml    Exam  General: Well developed, well nourished, NAD  HEENT: NCAT,  mucous membranes moist.   Cardiovascular: S1 S2 auscultated, RRR, no murmurs  Respiratory: Clear to auscultation   Abdomen: Soft, obese, nontender, nondistended, + bowel sounds  Extremities: warm dry  without cyanosis clubbing or edema  Data Review   Micro Results No results found for this or any previous visit (from the past 240 hour(s)).  Radiology Reports Ct Angio Chest Pe W/cm &/or Wo Cm  06/07/2014   CLINICAL DATA:  74 year old male with witnessed cardiac arrest this morning, status post CPR.  EXAM: CT ANGIOGRAPHY CHEST WITH CONTRAST  TECHNIQUE: Multidetector CT imaging of the chest was performed using the standard protocol during bolus administration of intravenous contrast. Multiplanar CT image reconstructions and MIPs were obtained to evaluate the vascular anatomy.  CONTRAST:  160mL OMNIPAQUE IOHEXOL 350 MG/ML SOLN  COMPARISON:  No priors.  FINDINGS: Study is exceedingly limited for evaluation of pulmonary embolism secondary to patient's large body habitus, inability to images a patient with the arms in the up position, and suboptimal contrast bolus.  Mediastinum/Lymph Nodes: No central or lobar filling defect is noted to suggest large pulmonary embolism. Smaller segmental and subsegmental size filling defects cannot be excluded on today's examination secondary to the limitations discussed above. Pulmonic trunk is dilated measuring 4.1 cm in diameter, suggesting underlying pulmonary arterial hypertension. Cardiomegaly with left ventricular dilatation. There is atherosclerosis of the thoracic aorta, the great vessels of the mediastinum and the coronary arteries, including calcified atherosclerotic plaque in the left main, left anterior descending and  right coronary arteries. There is no significant pericardial fluid, thickening or pericardial calcification. No pathologically enlarged mediastinal or hilar lymph nodes. Esophagus is unremarkable in appearance. No axillary lymphadenopathy. Nasogastric tube extends at least to the stomach (it extends below the lower margin of the images). Endotracheal tube in position with tip terminating approximately 2.4 cm above the carina.  Lungs/Pleura: Dependent subsegmental atelectasis in the lower lobes of the lungs bilaterally (right greater than left). Small amount of dependent airspace consolidation in the posterior aspect of the right upper lobe and in the inferior right lower lobe which may suggest mild aspiration given the patient's history. No pneumothorax. No pleural effusions.  Upper Abdomen: Small calcified gallstone in the gallbladder. Diffuse low attenuation of the hepatic parenchyma, indicative of hepatic steatosis.  Musculoskeletal/Soft Tissues: No acute displaced fractures or aggressive appearing lytic or blastic lesions are noted in the visualized portions of the skeleton.  Review of the MIP images confirms the above findings.  IMPRESSION: 1. Exceedingly limited examination demonstrating no central or lobar sized pulmonary embolism. Segmental and subsegmental sized emboli cannot be excluded. 2. Dependent airspace consolidation in the posterior right upper lobe and dependent portion of the right lower lobe, likely sequela of mild aspiration. 3. Cardiomegaly with left ventricular dilatation. 4. Dilatation of the pulmonic trunk (4.1 cm in diameter), suggestive of pulmonary arterial hypertension. 5. Atherosclerosis, including left main and 2 vessel coronary artery disease. Assessment for potential risk factor modification, dietary therapy or pharmacologic therapy may be warranted, if clinically indicated. 6. Mild dependent subsegmental atelectasis in the lower lobes of the lungs bilaterally. 7. Hepatic steatosis.  8. Cholelithiasis. 9. Support apparatus, as above.   Electronically Signed   By: Vinnie Langton M.D.   On: 06/07/2014 16:33   Nm Pulmonary Perf And Vent  06/10/2014   CLINICAL DATA:  Difficulty breathing  EXAM: NUCLEAR MEDICINE VENTILATION - PERFUSION LUNG SCAN  Views: Anterior, posterior, RPO, LPO, RAO, LAO -ventilation and perfusion. Lateral images could not be obtained due to patient's size.  Radionuclide: Technetium 19m DTPA -ventilation; tTechnetium 27m macroaggregated albumin-perfusion  Dose:  40.0 mCi-ventilation; 6.0 mCi-perfusion  Route of administration: Inhalation -ventilation ; intravenous- perfusion  COMPARISON:  Chest radiograph April 10, 2014  FINDINGS: Ventilation: There is cardiomegaly. There are a few subsegmental ventilation defects. There is no segmental level ventilation defect.  Perfusion: There is cardiomegaly. There are a few small subsegmental perfusion defects which match ventilation defects. There is no segmental perfusion defect. There is no significant ventilation/perfusion mismatch.  IMPRESSION: Small ventilation and perfusion defects without appreciable ventilation/ perfusion mismatch. Cardiomegaly. This study constitutes a low probability of pulmonary embolus.   Electronically Signed   By: Lowella Grip III M.D.   On: 06/10/2014 16:36   Dg Chest Port 1 View  06/09/2014   CLINICAL DATA:  History of endotracheal tube  EXAM: PORTABLE CHEST - 1 VIEW  COMPARISON:  06/07/2014  FINDINGS: Endotracheal tube in good position. Left subclavian central venous catheter tip in the SVC also unchanged. NG tube in the stomach  Hypoventilation with bibasilar atelectasis/ infiltrate left greater than right is unchanged from the prior study. Negative for edema or effusion.  IMPRESSION: Hypoventilation with bibasilar consolidation unchanged. No new findings. Support lines in satisfactory position.   Electronically Signed   By: Franchot Gallo M.D.   On: 06/09/2014 07:37   Dg Chest Port 1  View  06/07/2014   CLINICAL DATA:  Central line placement.  EXAM: PORTABLE CHEST - 1 VIEW  COMPARISON:  01/20/2014  FINDINGS: Endotracheal tube has tip 3.8 cm above the carina. Nasogastric tube courses into the region of the stomach and off the inferior portion of the film. Left subclavian central venous catheter is present with tip in the region of the SVC at the level of the carina. No evidence of pneumothorax.  Lungs are moderately hypoinflated with stable elevation of the right hemidiaphragm. Mild stable cardiomegaly. Remainder the exam is unchanged.  IMPRESSION: Hypoinflation without evidence of acute disease. Mild stable cardiomegaly.  Tubes and lines as described.   Electronically Signed   By: Marin Olp M.D.   On: 06/07/2014 17:30    CBC  Recent Labs Lab 06/14/14 0551 06/15/14 0450 06/16/14 0541 06/17/14 0442 06/18/14 0348  WBC 5.0 4.8 4.8 5.3 6.2  HGB 9.7* 10.5* 10.0* 10.4* 10.6*  HCT 30.1* 31.9* 31.5* 32.3* 33.2*  PLT 150 200 221 250 264  MCV 95.0 94.7 94.6 94.4 94.6  MCH 30.6 31.2 30.0 30.4 30.2  MCHC 32.2 32.9 31.7 32.2 31.9  RDW 13.9 13.6 13.7 14.0 13.7    Chemistries   Recent Labs Lab 06/13/14 0520 06/14/14 0551 06/16/14 1025 06/17/14 1259 06/18/14 0348  NA 142 142 142 141 140  K 3.7 3.5 4.1 3.8 3.8  CL 107 104 101 102 101  CO2 30 31 32 36* 28  GLUCOSE 138* 125* 113* 130* 130*  BUN 20 16 24* 29* 36*  CREATININE 1.26 1.21 1.51* 1.61* 1.60*  CALCIUM 8.7 8.8 9.0 8.8 8.9   ------------------------------------------------------------------------------------------------------------------ estimated creatinine clearance is 59.2 mL/min (by C-G formula based on Cr of 1.6). ------------------------------------------------------------------------------------------------------------------ No results for input(s): HGBA1C in the last 72 hours. ------------------------------------------------------------------------------------------------------------------ No results  for input(s): CHOL, HDL, LDLCALC, TRIG, CHOLHDL, LDLDIRECT in the last 72 hours. ------------------------------------------------------------------------------------------------------------------ No results for input(s): TSH, T4TOTAL, T3FREE, THYROIDAB in the last 72 hours.  Invalid input(s): FREET3 ------------------------------------------------------------------------------------------------------------------ No results for input(s): VITAMINB12, FOLATE, FERRITIN, TIBC, IRON, RETICCTPCT in the last 72 hours.  Coagulation profile  Recent Labs Lab 06/12/14 0445  INR 1.17    No results for input(s): DDIMER in the last 72 hours.  Cardiac Enzymes No results for input(s): CKMB, TROPONINI, MYOGLOBIN in the last 168 hours.  Invalid input(s): CK ------------------------------------------------------------------------------------------------------------------ Invalid input(s): POCBNP    Izael Bessinger D.O. on 06/18/2014 at 12:09 PM  Between 7am to 7pm - Pager - [phone]  After 7pm go to [email or web address] - password [email or web address]  And look for the night coverage person covering for me after hours  [facility] Office  [phone]

## 2014-06-18 NOTE — Interval H&P Note (Signed)
ICD Criteria  Current LVEF:40% ;Obtained < 1 month ago.  NYHA Functional Classification: Class II  Heart Failure History:  Yes, Duration of heart failure since onset is > 9 months  Non-Ischemic Dilated Cardiomyopathy History:  Yes, timeframe is > 9 months  Atrial Fibrillation/Atrial Flutter:  No.  Ventricular Tachycardia History:  Yes, Hemodynamic instability present, VT Type:  SVT - Polymorphic.  Cardiac Arrest History:  Yes, This was a Ventricular Tachycardia/Ventricular Fibrillation Arrest. This was NOT a bradycardia arrest.  History of Syndromes with Risk of Sudden Death:  No.  Previous ICD:  No.  Electrophysiology Study: No.  Prior MI: No.  PPM: No.  OSA:  Yes  Patient Life Expectancy of >=1 year: Yes.  Anticoagulation Therapy:  Patient is NOT on anticoagulation therapy.   Beta Blocker Therapy:  Yes.   Ace Inhibitor/ARB Therapy:  Yes.History and Physical Interval Note:  06/18/2014 9:29 AM  [name]  has presented today for surgery, with the diagnosis of ca  The various methods of treatment have been discussed with the patient and family. After consideration of risks, benefits and other options for treatment, the patient has consented to  Procedure(s): BI-VENTRICULAR IMPLANTABLE CARDIOVERTER DEFIBRILLATOR  (CRT-D) (N/A) as a surgical intervention .  The patient's history has been reviewed, patient examined, no change in status, stable for surgery.  I have reviewed the patient's chart and labs.  Questions were answered to the patient's satisfaction.     Virl Axe

## 2014-06-18 NOTE — Interval H&P Note (Signed)
History and Physical Interval Note:  06/18/2014 9:07 AM  Rickey Rice  has presented today for surgery, with the diagnosis of ca  The various methods of treatment have been discussed with the patient and family. After consideration of risks, benefits and other options for treatment, the patient has consented to  Procedure(s): BI-VENTRICULAR IMPLANTABLE CARDIOVERTER DEFIBRILLATOR  (CRT-D) (N/A) as a surgical intervention .  The patient's history has been reviewed, patient examined, no change in status, stable for surgery.  I have reviewed the patient's chart and labs.  Questions were answered to the patient's satisfaction.     Virl Axe

## 2014-06-18 NOTE — Discharge Instructions (Signed)
° ° °  Supplemental Discharge Instructions for  Pacemaker/Defibrillator Patients  Activity No heavy lifting or vigorous activity with your left/right arm for 6 to 8 weeks.  Do not raise your left/right arm above your head for one week.  Gradually raise your affected arm as drawn below.           __      06/24/14                  06/25/14                       06/26/14                   06/27/14  NO DRIVING for  6 months     WOUND CARE - Keep the wound area clean and dry.  Do not get this area wet for one week. No showers for one week; you may shower on   06/27/14  . - The tape/steri-strips on your wound will fall off; do not pull them off.  No bandage is needed on the site.  DO  NOT apply any creams, oils, or ointments to the wound area. - If you notice any drainage or discharge from the wound, any swelling or bruising at the site, or you develop a fever > 101? F after you are discharged home, call the office at once.  Special Instructions - You are still able to use cellular telephones; use the ear opposite the side where you have your pacemaker/defibrillator.  Avoid carrying your cellular phone near your device. - When traveling through airports, show security personnel your identification card to avoid being screened in the metal detectors.  Ask the security personnel to use the hand wand. - Avoid arc welding equipment, MRI testing (magnetic resonance imaging), TENS units (transcutaneous nerve stimulators).  Call the office for questions about other devices. - Avoid electrical appliances that are in poor condition or are not properly grounded. - Microwave ovens are safe to be near or to operate.  Additional information for defibrillator patients should your device go off: - If your device goes off ONCE and you feel fine afterward, notify the device clinic nurses. - If your device goes off ONCE and you do not feel well afterward, call 911. - If your device goes off TWICE, call 911. - If your  device goes off THREE times in one day, call 911.  DO NOT DRIVE YOURSELF OR A FAMILY MEMBER WITH A DEFIBRILLATOR TO THE HOSPITAL--CALL 911.

## 2014-06-18 NOTE — H&P (View-Only) (Signed)
SUBJECTIVE: The patient is doing well today.  At this time, he denies chest pain, shortness of breath, or any new concerns.  CRT ICD today  CURRENT MEDICATIONS: . antiseptic oral rinse  7 mL Mouth Rinse BID  . carvedilol  6.25 mg Oral BID WC  .  ceFAZolin (ANCEF) IV  3 g Intravenous On Call  . celecoxib  200 mg Oral Daily  . chlorhexidine  60 mL Topical Once  . chlorhexidine  60 mL Topical Once  . furosemide  40 mg Oral BID  . gentamicin irrigation  80 mg Irrigation On Call  . insulin aspart  0-5 Units Subcutaneous QHS  . insulin aspart  0-9 Units Subcutaneous TID WC  . levothyroxine  75 mcg Per Tube Daily  . lisinopril  10 mg Oral Daily  . Living Better with Heart Failure Book   Does not apply Once  . rosuvastatin  10 mg Per Tube q1800  . spironolactone  12.5 mg Oral Daily  . tamsulosin  0.4 mg Oral Daily   . sodium chloride 10 mL/hr at 06/10/14 2301  . sodium chloride    . sodium chloride      OBJECTIVE: Physical Exam: Filed Vitals:   06/17/14 1846 06/17/14 2036 06/18/14 0610 06/18/14 0756  BP:  102/58 104/64 105/63  Pulse: 76 78 76 63  Temp:  98.1 F (36.7 C) 98.7 F (37.1 C)   TempSrc:  Oral Oral   Resp:  18 18   Height:      Weight:   304 lb 9.6 oz (138.166 kg)   SpO2:  100% 97%     Intake/Output Summary (Last 24 hours) at 06/18/14 3300 Last data filed at 06/18/14 7622  Gross per 24 hour  Intake      0 ml  Output    800 ml  Net   -800 ml    Telemetry reveals sinus rhythm with occasional PVC's  Well developed and nourished in no acute distress HENT normal Neck supple with JVP-flat Clear Regular rate and rhythm, no murmurs or gallops Abd-soft with active BS No Clubbing cyanosis edema Skin-warm and dry A & Oriented  Grossly normal sensory and motor function  LABS: Basic Metabolic Panel:  Recent Labs  06/17/14 1259 06/18/14 0348  NA 141 140  K 3.8 3.8  CL 102 101  CO2 36* 28  GLUCOSE 130* 130*  BUN 29* 36*  CREATININE 1.61* 1.60*    CALCIUM 8.8 8.9   CBC:  Recent Labs  06/17/14 0442 06/18/14 0348  WBC 5.3 6.2  HGB 10.4* 10.6*  HCT 32.3* 33.2*  MCV 94.4 94.6  PLT 250 264   ASSESSMENT AND PLAN:  Active Problems:   PEA (Pulseless electrical activity)   Anoxic encephalopathy   Cardiac arrest   Acute respiratory failure with hypoxemia   Congestive dilated cardiomyopathy   Essential hypertension   Hypertension   CKD (chronic kidney disease) stage 2, GFR 60-89 ml/min   DM type 2 causing renal disease   BPH (benign prostatic hypertrophy)   Obesity, morbid  VF arrest Primary VF arrest in the setting of non-ischemic cardiomyopathy and LBBB   Non-ischemic cardiomyopathy/congestive heart failure Continue Coreg, Lasix, Lisinopril, Spironolactone Euvolemic on exam today   CKD stage 2 Creat improved from admission Cr was increasing yesterday and will repeat today  We'll plan to proceed with CRT-D implantation for secondary prevention  Have reviewed the potential benefits and risks of ICD implantation including but not limited to death, perforation  of heart or lung, lead dislodgement, infection,  device malfunction and inappropriate shocks.  The patient and family express understanding  and are willing to proceed.        06/18/2014 9:06 AM

## 2014-06-18 NOTE — Progress Notes (Signed)
SUBJECTIVE: The patient is doing well today.  At this time, he denies chest pain, shortness of breath, or any new concerns.  CRT ICD today  CURRENT MEDICATIONS: . antiseptic oral rinse  7 mL Mouth Rinse BID  . carvedilol  6.25 mg Oral BID WC  .  ceFAZolin (ANCEF) IV  3 g Intravenous On Call  . celecoxib  200 mg Oral Daily  . chlorhexidine  60 mL Topical Once  . chlorhexidine  60 mL Topical Once  . furosemide  40 mg Oral BID  . gentamicin irrigation  80 mg Irrigation On Call  . insulin aspart  0-5 Units Subcutaneous QHS  . insulin aspart  0-9 Units Subcutaneous TID WC  . levothyroxine  75 mcg Per Tube Daily  . lisinopril  10 mg Oral Daily  . Living Better with Heart Failure Book   Does not apply Once  . rosuvastatin  10 mg Per Tube q1800  . spironolactone  12.5 mg Oral Daily  . tamsulosin  0.4 mg Oral Daily   . sodium chloride 10 mL/hr at 06/10/14 2301  . sodium chloride    . sodium chloride      OBJECTIVE: Physical Exam: Filed Vitals:   06/17/14 1846 06/17/14 2036 06/18/14 0610 06/18/14 0756  BP:  102/58 104/64 105/63  Pulse: 76 78 76 63  Temp:  98.1 F (36.7 C) 98.7 F (37.1 C)   TempSrc:  Oral Oral   Resp:  18 18   Height:      Weight:   304 lb 9.6 oz (138.166 kg)   SpO2:  100% 97%     Intake/Output Summary (Last 24 hours) at 06/18/14 2992 Last data filed at 06/18/14 4268  Gross per 24 hour  Intake      0 ml  Output    800 ml  Net   -800 ml    Telemetry reveals sinus rhythm with occasional PVC's  Well developed and nourished in no acute distress HENT normal Neck supple with JVP-flat Clear Regular rate and rhythm, no murmurs or gallops Abd-soft with active BS No Clubbing cyanosis edema Skin-warm and dry A & Oriented  Grossly normal sensory and motor function  LABS: Basic Metabolic Panel:  Recent Labs  06/17/14 1259 06/18/14 0348  NA 141 140  K 3.8 3.8  CL 102 101  CO2 36* 28  GLUCOSE 130* 130*  BUN 29* 36*  CREATININE 1.61* 1.60*    CALCIUM 8.8 8.9   CBC:  Recent Labs  06/17/14 0442 06/18/14 0348  WBC 5.3 6.2  HGB 10.4* 10.6*  HCT 32.3* 33.2*  MCV 94.4 94.6  PLT 250 264   ASSESSMENT AND PLAN:  Active Problems:   PEA (Pulseless electrical activity)   Anoxic encephalopathy   Cardiac arrest   Acute respiratory failure with hypoxemia   Congestive dilated cardiomyopathy   Essential hypertension   Hypertension   CKD (chronic kidney disease) stage 2, GFR 60-89 ml/min   DM type 2 causing renal disease   BPH (benign prostatic hypertrophy)   Obesity, morbid  VF arrest Primary VF arrest in the setting of non-ischemic cardiomyopathy and LBBB   Non-ischemic cardiomyopathy/congestive heart failure Continue Coreg, Lasix, Lisinopril, Spironolactone Euvolemic on exam today   CKD stage 2 Creat improved from admission Cr was increasing yesterday and will repeat today  We'll plan to proceed with CRT-D implantation for secondary prevention  Have reviewed the potential benefits and risks of ICD implantation including but not limited to death, perforation  of heart or lung, lead dislodgement, infection,  device malfunction and inappropriate shocks.  The patient and family express understanding  and are willing to proceed.        06/18/2014 9:06 AM

## 2014-06-18 NOTE — Op Note (Signed)
NAME:  Rickey Rice, BURCH NO.:  [id number]  MEDICAL RECORD NO.:  [id number]  LOCATION:  2W03C                        FACILITY:  [facility]  PHYSICIAN:  Deboraha Sprang, MD, FACCDATE OF BIRTH:  [birth year]  DATE OF PROCEDURE:  06/18/2014 DATE OF DISCHARGE:                              OPERATIVE REPORT   PREOPERATIVE DIAGNOSES:  Aborted cardiac arrest with nonischemic cardiomyopathy, left bundle-branch block, and congestive heart failure.  PREOPERATIVE DIAGNOSES:  Aborted cardiac arrest with nonischemic cardiomyopathy, left bundle-branch block, and congestive heart failure.  PROCEDURE:  Defibrillator implantation with left ventricular lead placement.  DESCRIPTION OF PROCEDURE:  Following obtaining informed consent, the patient was brought to Electrophysiology Laboratory and placed on the fluoroscopic table in supine position.  After routine prep and drape, lidocaine was infiltrated in the prepectoral groove, an incision was made and carried down to the prepectoral fascia.  Using electrocautery and sharp dissection, a pocket was formed.  Hemostasis was obtained.  Thereafter, attention was turned to gain access to the extrathoracic left subclavian vein, which was accomplished without difficulty and without puncture of the artery or aspiration of air.  Three separate venipunctures were accomplished and sequentially an 8-French, 9.5- [place], and 7-French sheaths were placed through which were passed a St. Jude 7122, 65 cm active fixation ventricular lead, serial number [id number].  A St. Jude 135 CS cannulation sheath and a St. Jude 1688 TC 52 cm active fixation atrial lead, serial number [id number].  Under fluoroscopic guidance, the defibrillator lead was manipulated to the RV apex where the bipolar R-wave was 11.6 with a pace impedance of 553, threshold 0.9 V at 0.5 milliseconds, current of threshold 1.3 mA. There was no diaphragmatic pacing at 10 V.  The current of injury  was brisk.  This lead was secured to the prepectoral fascia.  We then obtained access to the coronary sinus with some difficulty.  It turned out that there was a large proximal bifurcation into the mid cardiac vein, and normal coronary sinus body.  It turned out there was also a further bifurcation of the coronary sinus body about 2 cm from the os.  We took out venograms of the various veins and unfortunately there were very few if any lateral branches that we could identify that would be large enough to house a lead.  There was a branch that came off the mid cardiac vein that moved on to the lateral surface, this location was ultimately targeted and we passed a St. Jude 1458 lead, serial number [id number] to the junction between the apical and mid third on the lateral wall.  In this location, the bipolar amplitude was set at 20 with a pace impedance in the tip to coil configuration of 543, threshold 0.7 V at 0.5 milliseconds, current of threshold was 1.2 mA.  There was no diaphragmatic pacing at 10 V.  This lead was secured to the prepectoral fascia following the removal of the CS delivery system.  We then deployed the right atrial lead into the appendage where the bipolar amplitude was 2.9 with a pace impedance of 440, the threshold 1.1 V at 0.5 milliseconds.  Current of threshold was 2.2 mA.  There was no  diaphragmatic pacing at 10 V and this lead was then secured to the prepectoral fascia.  The leads were then attached to a [place] ICD serial [id number]. Through the device, the bipolar P-wave was 3 with a pace impedance of 450, threshold 1.5 V at 0.5 milliseconds.  The R-wave was 10.6 with a pace impedance of 660, threshold of 0.5 at 0.5 and the LV impedance was 5 with threshold 0.75 at 0.5.  High-voltage impedance was 70 ohms.  The pocket was copiously irrigated with antibiotic containing saline solution.  Hemostasis was assured and the leads in the pulse generator were  then placed in the pocket.  An Aegis antimicrobial pouch was used because the patient has had a long-standing transcutaneous left subclavian line in the general vicinity.  The wound was closed in 2 layers in normal fashion.  The wound was washed, dried, and a Dermabond dressing was applied.  Needle counts, sponge counts, and instrument counts were correct at the end of the procedure according to the staff.  The patient tolerated the procedure without apparent complications.     Deboraha Sprang, MD, [facility]     SCK/MEDQ  D:  06/18/2014  T:  06/18/2014  Job:  [id number]

## 2014-06-18 NOTE — Progress Notes (Signed)
PT Cancellation Note  Patient Details Name: Rickey Rice MRN: [id number] DOB: [birth year]   Cancelled Treatment:    Reason Eval/Treat Not Completed: Other (comment) (continue to await defibrillator placement for post op followup)   [name] 06/18/2014, [facility], [place]

## 2014-06-18 NOTE — CV Procedure (Signed)
STEED KANAAN [id number]  [id number]  Preop Dx: cardiac arrest-aborted with LBBB NICM and CHF class 2 Postop Dx same/   Procedure:ICD implantation with LV lead placement  Cx: None   EBL: Minimal    Dictation number [id number]  Virl Axe, MD 06/18/2014 11:35 AM

## 2014-06-19 ENCOUNTER — Telehealth: Payer: Self-pay | Admitting: Internal Medicine

## 2014-06-19 ENCOUNTER — Inpatient Hospital Stay (HOSPITAL_COMMUNITY): Payer: Medicare Other

## 2014-06-19 DIAGNOSIS — I959 Hypotension, unspecified: Secondary | ICD-10-CM

## 2014-06-19 DIAGNOSIS — Z789 Other specified health status: Secondary | ICD-10-CM | POA: Insufficient documentation

## 2014-06-19 DIAGNOSIS — N400 Enlarged prostate without lower urinary tract symptoms: Secondary | ICD-10-CM

## 2014-06-19 LAB — CBC
HEMATOCRIT: 31.6 % — AB (ref 39.0–52.0)
HEMOGLOBIN: 10.3 g/dL — AB (ref 13.0–17.0)
MCH: 30.6 pg (ref 26.0–34.0)
MCHC: 32.6 g/dL (ref 30.0–36.0)
MCV: 93.8 fL (ref 78.0–100.0)
Platelets: 256 10*3/uL (ref 150–400)
RBC: 3.37 MIL/uL — AB (ref 4.22–5.81)
RDW: 13.3 % (ref 11.5–15.5)
WBC: 7.8 10*3/uL (ref 4.0–10.5)

## 2014-06-19 LAB — BASIC METABOLIC PANEL
Anion gap: 12 (ref 5–15)
BUN: 34 mg/dL — AB (ref 6–23)
CALCIUM: 8.7 mg/dL (ref 8.4–10.5)
CO2: 25 mmol/L (ref 19–32)
Chloride: 102 mmol/L (ref 96–112)
Creatinine, Ser: 1.55 mg/dL — ABNORMAL HIGH (ref 0.50–1.35)
GFR calc Af Amer: 50 mL/min — ABNORMAL LOW (ref >90)
GFR calc non Af Amer: 43 mL/min — ABNORMAL LOW (ref >90)
Glucose, Bld: 135 mg/dL — ABNORMAL HIGH (ref 70–99)
Potassium: 3.9 mmol/L (ref 3.5–5.1)
Sodium: 139 mmol/L (ref 135–145)

## 2014-06-19 LAB — GLUCOSE, CAPILLARY: GLUCOSE-CAPILLARY: 125 mg/dL — AB (ref 70–99)

## 2014-06-19 MED ORDER — LISINOPRIL 10 MG PO TABS: 10.0000 mg | ORAL_TABLET | Freq: Every day | ORAL | Status: DC

## 2014-06-19 MED ORDER — FUROSEMIDE 40 MG PO TABS: 40.0000 mg | ORAL_TABLET | Freq: Every day | ORAL | Status: DC

## 2014-06-19 MED ORDER — ROSUVASTATIN CALCIUM 10 MG PO TABS: 10.0000 mg | ORAL_TABLET | Freq: Every day | ORAL | Status: DC

## 2014-06-19 MED ORDER — CARVEDILOL 6.25 MG PO TABS: 6.2500 mg | ORAL_TABLET | Freq: Two times a day (BID) | ORAL | Status: DC

## 2014-06-19 MED ORDER — ACETAMINOPHEN 325 MG PO TABS: 325.0000 mg | ORAL_TABLET | ORAL | Status: DC | PRN

## 2014-06-19 NOTE — Telephone Encounter (Signed)
7-10 DAY TOC PT PER [facility]--APPT 06-29-14 WITH [facility]

## 2014-06-19 NOTE — Discharge Summary (Signed)
Physician Discharge Summary  Rickey Rice DDU:[id number] DOB: 1940-09-03 DOA: 06/07/2014  PCP: Elyn Peers, MD  Admit date: 06/07/2014 Discharge date: 06/19/2014  Time spent: 45 minutes  Recommendations for Outpatient Follow-up:  Patient will be discharged home. Follow up with his primary care physician within one week of discharge. Patient was also had a repeat BMP at that time. Patient should discuss his diabetes control with his primary care physician. Patient should also follow-up with Dr. Debara Pickett within 2-3 weeks of discharge. Patient to continue his medications as prescribed. Patient was given specific driving instructions by cardiology.   Discharge Diagnoses:  Cardiac arrest with PEA/nonischemic cardiomyopathy Anoxic encephalopathy Acute respiratory failure with hypoxemia Essential hypertension Acute on chronic kidney disease, stage II Diabetes mellitus with renal disease Morbid obesity Normocytic anemia Hypothyroidism  Discharge Condition: Stable  Diet recommendation: Heart health/carb modified  Filed Weights   06/17/14 0447 06/18/14 0610 06/19/14 0621  Weight: 139.164 kg (306 lb 12.8 oz) 138.166 kg (304 lb 9.6 oz) 139.844 kg (308 lb 4.8 oz)    History of present illness:  on 06/07/2014 by Dr. Merton Border (ICU) 74 M with obesity, prior bilateral knee surgeries, chronic LBBB, mild cardiomyopathy suffered cardiac arrest while attending church. Initial rhythm was PEA. Subsequently VF. ROSC in approx 15 mins. Was purposeful in ED but required intubation after he removed [facility] airway. Code cool called and PCCM admitted  Hospital Course:  Cardiac arrest with PEA/nonischemic cardiomyopathy -Felt to be secondary to nonischemic cardiomyopathy.  -EF 40% -Continue diuresing, lost >25lbs -Cardiology consulted and appreciated -ICD placed on 3/24 -Patient will need to follow up with Dr. Debara Pickett in 2-3 weeks -Spironolactone held due to worsening of Cr -Meds will be reviewed  with cardiology as an outpatient -Spoke with cardiology, continue lasix 40mg  daily at discharge, no spironolactone, add aspirin 81mg  daily, statin  Anoxic encephalopathy:  -Patient's mentation appears intact -Likely secondary to above  Acute respiratory failure with hypoxemia -Resolved, Secondary to acute systolic CHF.  -Currently on room air  Essential hypertension -Stable, well controlled -Continue Coreg, Lasix, lisinopril  Acute on chronic kidney disease stage 2 -Creatinine 1.5.  -Complicated by diuresis -Patient should have repeat BMP within 1 week of discharge  Diabetes mellitus with renal disease -Placed on ISS and CBG monitoring during hospitalization -Hold metformin -Continue onglyza and glimepiride -HbA1c 6.7 -Discuss restarting with PCP  Morbid obesity -BMI >40 -Discuss labs, lifestyle modifications with PCP upon discharge  Normocytic Anemia -Baseline Hb 10-11 -Currently 10.3, continue to monitor  Hypothyroidism -Continue Synthroid  Procedures   Cardiac catheterization plan 3/18: Mild disease, no evidence of obstruction, confirmed EF of 40%  intubation 3/13-3/15  echocardiogram done 3/13: Left bundle branch block, decreased ejection fraction to 40%  Lower extremity Dopplers done 3/14: No evidence of DVT  ICD placement 06/18/2014  Consults  Critical care Cardiology  Discharge Exam: Filed Vitals:   06/19/14 0621  BP: 95/63  Pulse: 73  Temp: 98.7 F (37.1 C)  Resp: 18   Exam  General: Well developed, well nourished, NAD  HEENT: NCAT, mucous membranes moist.   Cardiovascular: S1 S2 auscultated, RRR, no murmurs  Respiratory: Clear to auscultation  Abdomen: Soft, obese, nontender, nondistended, + bowel sounds  Extremities: warm dry without cyanosis clubbing or edema  Discharge Instructions      Discharge Instructions    Discharge instructions    Complete by:  As directed   Patient will be discharged home. Follow up with his  primary care physician within one week of  discharge. Patient was also had a repeat BMP at that time. Patient should discuss his diabetes control with his primary care physician. Patient should also follow-up with Dr. Debara Pickett within 2-3 weeks of discharge. Patient to continue his medications as prescribed. Patient was given specific driving instructions by cardiology.   You were cared for by a hospitalist during your hospital stay. If you have any questions about your discharge medications or the care you received while you were in the hospital after you are discharged, you can call the unit and asked to speak with the hospitalist on call if the hospitalist that took care of you is not available. Once you are discharged, your primary care physician will handle any further medical issues. Please note that NO REFILLS for any discharge medications will be authorized once you are discharged, as it is imperative that you return to your primary care physician (or establish a relationship with a primary care physician if you do not have one) for your aftercare needs so that they can reassess your need for medications and monitor your lab values.            Medication List    STOP taking these medications        amLODipine 5 MG tablet  Commonly known as:  NORVASC     amoxicillin 500 MG capsule  Commonly known as:  AMOXIL     metFORMIN 500 MG 24 hr tablet  Commonly known as:  GLUCOPHAGE-XR     TEKTURNA HCT 300-25 MG Tabs  Generic drug:  Aliskiren-Hydrochlorothiazide      TAKE these medications        acetaminophen 325 MG tablet  Commonly known as:  TYLENOL  Take 1-2 tablets (325-650 mg total) by mouth every 4 (four) hours as needed for mild pain.     aspirin EC 81 MG tablet  Take 81 mg by mouth daily.     carvedilol 6.25 MG tablet  Commonly known as:  COREG  Take 1 tablet (6.25 mg total) by mouth 2 (two) times daily with a meal.     celecoxib 200 MG capsule  Commonly known as:  CELEBREX    Take 200 mg by mouth daily.     furosemide 40 MG tablet  Commonly known as:  LASIX  Take 1 tablet (40 mg total) by mouth daily.     glimepiride 4 MG tablet  Commonly known as:  AMARYL  Take 8 mg by mouth daily before breakfast.     ipratropium 17 MCG/ACT inhaler  Commonly known as:  ATROVENT HFA  Inhale 2 puffs into the lungs every 6 (six) hours.     levothyroxine 75 MCG tablet  Commonly known as:  SYNTHROID, LEVOTHROID  Take 75 mcg by mouth daily before breakfast.     lisinopril 10 MG tablet  Commonly known as:  PRINIVIL,ZESTRIL  Take 1 tablet (10 mg total) by mouth daily.     LUTEIN ESTERS PO  Take 1 capsule by mouth every other day.     ONGLYZA 5 MG Tabs tablet  Generic drug:  saxagliptin HCl  Take 5 mg by mouth daily.     oxyCODONE-acetaminophen 5-325 MG per tablet  Commonly known as:  ROXICET  Take 1 tablet by mouth every 4 (four) hours as needed.     rosuvastatin 10 MG tablet  Commonly known as:  CRESTOR  Take 1 tablet (10 mg total) by mouth daily at 6 PM.     tamsulosin 0.4 MG Caps  capsule  Commonly known as:  FLOMAX  Take 1 capsule by mouth daily.       Allergies  Allergen Reactions  . Bee Venom Swelling    Swelling on lips and tongue  . Codeine   . Demerol [Meperidine]   . Pioglitazone     Lips swelling  . Shrimp [Shellfish Allergy]     rash  . Sulfa Antibiotics    Follow-up Information    Follow up with [facility] In 1 week.   Why:  the office will call with appointment.    Contact information:   [place] [phone]       Follow up with Elyn Peers, MD. Schedule an appointment as soon as possible for a visit in 1 week.   Specialty:  Family Medicine   Why:  Hospital follow-up   Contact information:   [place] [address] [phone]        The results of significant diagnostics from this hospitalization (including imaging, microbiology, ancillary and laboratory)  are listed below for reference.    Significant Diagnostic Studies: Dg Chest 2 View  06/19/2014   CLINICAL DATA:  ICD  EXAM: CHEST  2 VIEW  COMPARISON:  06/09/2014  FINDINGS: New biventricular ICD/ pacer from the left with leads in good position. There is no new cardiomegaly or mediastinal widening. No pneumothorax.  Improving lung volumes although still low and with subsegmental atelectasis.  IMPRESSION: No adverse findings after biventricular ICD/ pacer implant.   Electronically Signed   By: Monte Fantasia M.D.   On: 06/19/2014 08:50   Ct Angio Chest Pe W/cm &/or Wo Cm  06/07/2014   CLINICAL DATA:  74 year old male with witnessed cardiac arrest this morning, status post CPR.  EXAM: CT ANGIOGRAPHY CHEST WITH CONTRAST  TECHNIQUE: Multidetector CT imaging of the chest was performed using the standard protocol during bolus administration of intravenous contrast. Multiplanar CT image reconstructions and MIPs were obtained to evaluate the vascular anatomy.  CONTRAST:  1109mL OMNIPAQUE IOHEXOL 350 MG/ML SOLN  COMPARISON:  No priors.  FINDINGS: Study is exceedingly limited for evaluation of pulmonary embolism secondary to patient's large body habitus, inability to images a patient with the arms in the up position, and suboptimal contrast bolus.  Mediastinum/Lymph Nodes: No central or lobar filling defect is noted to suggest large pulmonary embolism. Smaller segmental and subsegmental size filling defects cannot be excluded on today's examination secondary to the limitations discussed above. Pulmonic trunk is dilated measuring 4.1 cm in diameter, suggesting underlying pulmonary arterial hypertension. Cardiomegaly with left ventricular dilatation. There is atherosclerosis of the thoracic aorta, the great vessels of the mediastinum and the coronary arteries, including calcified atherosclerotic plaque in the left main, left anterior descending and right coronary arteries. There is no significant pericardial fluid,  thickening or pericardial calcification. No pathologically enlarged mediastinal or hilar lymph nodes. Esophagus is unremarkable in appearance. No axillary lymphadenopathy. Nasogastric tube extends at least to the stomach (it extends below the lower margin of the images). Endotracheal tube in position with tip terminating approximately 2.4 cm above the carina.  Lungs/Pleura: Dependent subsegmental atelectasis in the lower lobes of the lungs bilaterally (right greater than left). Small amount of dependent airspace consolidation in the posterior aspect of the right upper lobe and in the inferior right lower lobe which may suggest mild aspiration given the patient's history. No pneumothorax. No pleural effusions.  Upper Abdomen: Small calcified gallstone in the gallbladder.  Diffuse low attenuation of the hepatic parenchyma, indicative of hepatic steatosis.  Musculoskeletal/Soft Tissues: No acute displaced fractures or aggressive appearing lytic or blastic lesions are noted in the visualized portions of the skeleton.  Review of the MIP images confirms the above findings.  IMPRESSION: 1. Exceedingly limited examination demonstrating no central or lobar sized pulmonary embolism. Segmental and subsegmental sized emboli cannot be excluded. 2. Dependent airspace consolidation in the posterior right upper lobe and dependent portion of the right lower lobe, likely sequela of mild aspiration. 3. Cardiomegaly with left ventricular dilatation. 4. Dilatation of the pulmonic trunk (4.1 cm in diameter), suggestive of pulmonary arterial hypertension. 5. Atherosclerosis, including left main and 2 vessel coronary artery disease. Assessment for potential risk factor modification, dietary therapy or pharmacologic therapy may be warranted, if clinically indicated. 6. Mild dependent subsegmental atelectasis in the lower lobes of the lungs bilaterally. 7. Hepatic steatosis. 8. Cholelithiasis. 9. Support apparatus, as above.   Electronically  Signed   By: Vinnie Langton M.D.   On: 06/07/2014 16:33   Nm Pulmonary Perf And Vent  06/10/2014   CLINICAL DATA:  Difficulty breathing  EXAM: NUCLEAR MEDICINE VENTILATION - PERFUSION LUNG SCAN  Views: Anterior, posterior, RPO, LPO, RAO, LAO -ventilation and perfusion. Lateral images could not be obtained due to patient's size.  Radionuclide: Technetium 78m DTPA -ventilation; tTechnetium 52m macroaggregated albumin-perfusion  Dose:  40.0 mCi-ventilation; 6.0 mCi-perfusion  Route of administration: Inhalation -ventilation ; intravenous- perfusion  COMPARISON:  Chest radiograph April 10, 2014  FINDINGS: Ventilation: There is cardiomegaly. There are a few subsegmental ventilation defects. There is no segmental level ventilation defect.  Perfusion: There is cardiomegaly. There are a few small subsegmental perfusion defects which match ventilation defects. There is no segmental perfusion defect. There is no significant ventilation/perfusion mismatch.  IMPRESSION: Small ventilation and perfusion defects without appreciable ventilation/ perfusion mismatch. Cardiomegaly. This study constitutes a low probability of pulmonary embolus.   Electronically Signed   By: Lowella Grip III M.D.   On: 06/10/2014 16:36   Dg Chest Port 1 View  06/09/2014   CLINICAL DATA:  History of endotracheal tube  EXAM: PORTABLE CHEST - 1 VIEW  COMPARISON:  06/07/2014  FINDINGS: Endotracheal tube in good position. Left subclavian central venous catheter tip in the SVC also unchanged. NG tube in the stomach  Hypoventilation with bibasilar atelectasis/ infiltrate left greater than right is unchanged from the prior study. Negative for edema or effusion.  IMPRESSION: Hypoventilation with bibasilar consolidation unchanged. No new findings. Support lines in satisfactory position.   Electronically Signed   By: Franchot Gallo M.D.   On: 06/09/2014 07:37   Dg Chest Port 1 View  06/07/2014   CLINICAL DATA:  Central line placement.  EXAM:  PORTABLE CHEST - 1 VIEW  COMPARISON:  01/20/2014  FINDINGS: Endotracheal tube has tip 3.8 cm above the carina. Nasogastric tube courses into the region of the stomach and off the inferior portion of the film. Left subclavian central venous catheter is present with tip in the region of the SVC at the level of the carina. No evidence of pneumothorax.  Lungs are moderately hypoinflated with stable elevation of the right hemidiaphragm. Mild stable cardiomegaly. Remainder the exam is unchanged.  IMPRESSION: Hypoinflation without evidence of acute disease. Mild stable cardiomegaly.  Tubes and lines as described.   Electronically Signed   By: Marin Olp M.D.   On: 06/07/2014 17:30    Microbiology: No results found for this or any previous visit (from the  past 240 hour(s)).   Labs: Basic Metabolic Panel:  Recent Labs Lab 06/14/14 0551 06/16/14 1025 06/17/14 1259 06/18/14 0348 06/19/14 0540  NA 142 142 141 140 139  K 3.5 4.1 3.8 3.8 3.9  CL 104 101 102 101 102  CO2 31 32 36* 28 25  GLUCOSE 125* 113* 130* 130* 135*  BUN 16 24* 29* 36* 34*  CREATININE 1.21 1.51* 1.61* 1.60* 1.55*  CALCIUM 8.8 9.0 8.8 8.9 8.7   Liver Function Tests: No results for input(s): AST, ALT, ALKPHOS, BILITOT, PROT, ALBUMIN in the last 168 hours. No results for input(s): LIPASE, AMYLASE in the last 168 hours. No results for input(s): AMMONIA in the last 168 hours. CBC:  Recent Labs Lab 06/15/14 0450 06/16/14 0541 06/17/14 0442 06/18/14 0348 06/19/14 0540  WBC 4.8 4.8 5.3 6.2 7.8  HGB 10.5* 10.0* 10.4* 10.6* 10.3*  HCT 31.9* 31.5* 32.3* 33.2* 31.6*  MCV 94.7 94.6 94.4 94.6 93.8  PLT 200 221 250 264 256   Cardiac Enzymes: No results for input(s): CKTOTAL, CKMB, CKMBINDEX, TROPONINI in the last 168 hours. BNP: BNP (last 3 results) No results for input(s): BNP in the last 8760 hours.  ProBNP (last 3 results) No results for input(s): PROBNP in the last 8760 hours.  CBG:  Recent Labs Lab  06/18/14 0606 06/18/14 0854 06/18/14 1157 06/18/14 1640 06/18/14 2140  GLUCAP 113* 120* 118* 256* 214*       Signed:  Cristal Ford  [facility] 06/19/2014, 9:44 AM

## 2014-06-19 NOTE — Telephone Encounter (Signed)
7-10 TOC pt per amber--APT 06-29-14 WITH LORI

## 2014-06-19 NOTE — Progress Notes (Signed)
SUBJECTIVE: The patient is doing well today.  At this time, he denies chest pain, shortness of breath, or any new concerns.  S/p CRTD implant 06-18-14  CURRENT MEDICATIONS: . antiseptic oral rinse  7 mL Mouth Rinse BID  . carvedilol  6.25 mg Oral BID WC  . celecoxib  200 mg Oral Daily  . furosemide  40 mg Oral BID  . insulin aspart  0-5 Units Subcutaneous QHS  . insulin aspart  0-9 Units Subcutaneous TID WC  . levothyroxine  75 mcg Per Tube Daily  . lisinopril  10 mg Oral Daily  . Living Better with Heart Failure Book   Does not apply Once  . rosuvastatin  10 mg Per Tube q1800  . tamsulosin  0.4 mg Oral Daily   . sodium chloride 10 mL/hr at 06/10/14 2301    OBJECTIVE: Physical Exam: Filed Vitals:   06/18/14 1340 06/18/14 1511 06/18/14 2109 06/19/14 0621  BP: 135/80 133/86 99/57 95/63   Pulse: 70 80 73 73  Temp:  98.5 F (36.9 C) 98.2 F (36.8 C) 98.7 F (37.1 C)  TempSrc:  Oral Oral Oral  Resp: 16 18 18 18   Height:      Weight:    308 lb 4.8 oz (139.844 kg)  SpO2: 95% 98% 95% 98%    Intake/Output Summary (Last 24 hours) at 06/19/14 0743 Last data filed at 06/18/14 0900  Gross per 24 hour  Intake      0 ml  Output      0 ml  Net      0 ml    Telemetry reveals sinus rhythm with V pacing  GEN- The patient is well appearing, alert and oriented x 3 today.   Head- normocephalic, atraumatic Eyes-  Sclera clear, conjunctiva pink Ears- hearing intact Oropharynx- clear Neck- supple, no JVP Lymph- no cervical lymphadenopathy Lungs- Clear to ausculation bilaterally, normal work of breathing Heart- Regular rate and rhythm, no murmurs, rubs or gallops, PMI not laterally displaced GI- soft, NT, ND, + BS Extremities- no clubbing, cyanosis, or edema Skin- no rash or lesion Psych- euthymic mood, full affect Neuro- strength and sensation are intact  LABS: Basic Metabolic Panel:  Recent Labs  06/18/14 0348 06/19/14 0540  NA 140 139  K 3.8 3.9  CL 101 102  CO2  28 25  GLUCOSE 130* 135*  BUN 36* 34*  CREATININE 1.60* 1.55*  CALCIUM 8.9 8.7   CBC:  Recent Labs  06/18/14 0348 06/19/14 0540  WBC 6.2 7.8  HGB 10.6* 10.3*  HCT 33.2* 31.6*  MCV 94.6 93.8  PLT 264 256    RADIOLOGY: Ct Angio Chest Pe W/cm &/or Wo Cm 06/07/2014   CLINICAL DATA:  74 year old male with witnessed cardiac arrest this morning, status post CPR.  EXAM: CT ANGIOGRAPHY CHEST WITH CONTRAST  TECHNIQUE: Multidetector CT imaging of the chest was performed using the standard protocol during bolus administration of intravenous contrast. Multiplanar CT image reconstructions and MIPs were obtained to evaluate the vascular anatomy.  CONTRAST:  165mL OMNIPAQUE IOHEXOL 350 MG/ML SOLN  COMPARISON:  No priors.  FINDINGS: Study is exceedingly limited for evaluation of pulmonary embolism secondary to patient's large body habitus, inability to images a patient with the arms in the up position, and suboptimal contrast bolus.  Mediastinum/Lymph Nodes: No central or lobar filling defect is noted to suggest large pulmonary embolism. Smaller segmental and subsegmental size filling defects cannot be excluded on today's examination secondary to the limitations discussed above. Pulmonic  trunk is dilated measuring 4.1 cm in diameter, suggesting underlying pulmonary arterial hypertension. Cardiomegaly with left ventricular dilatation. There is atherosclerosis of the thoracic aorta, the great vessels of the mediastinum and the coronary arteries, including calcified atherosclerotic plaque in the left main, left anterior descending and right coronary arteries. There is no significant pericardial fluid, thickening or pericardial calcification. No pathologically enlarged mediastinal or hilar lymph nodes. Esophagus is unremarkable in appearance. No axillary lymphadenopathy. Nasogastric tube extends at least to the stomach (it extends below the lower margin of the images). Endotracheal tube in position with tip  terminating approximately 2.4 cm above the carina.  Lungs/Pleura: Dependent subsegmental atelectasis in the lower lobes of the lungs bilaterally (right greater than left). Small amount of dependent airspace consolidation in the posterior aspect of the right upper lobe and in the inferior right lower lobe which may suggest mild aspiration given the patient's history. No pneumothorax. No pleural effusions.  Upper Abdomen: Small calcified gallstone in the gallbladder. Diffuse low attenuation of the hepatic parenchyma, indicative of hepatic steatosis.  Musculoskeletal/Soft Tissues: No acute displaced fractures or aggressive appearing lytic or blastic lesions are noted in the visualized portions of the skeleton.  Review of the MIP images confirms the above findings.  IMPRESSION: 1. Exceedingly limited examination demonstrating no central or lobar sized pulmonary embolism. Segmental and subsegmental sized emboli cannot be excluded. 2. Dependent airspace consolidation in the posterior right upper lobe and dependent portion of the right lower lobe, likely sequela of mild aspiration. 3. Cardiomegaly with left ventricular dilatation. 4. Dilatation of the pulmonic trunk (4.1 cm in diameter), suggestive of pulmonary arterial hypertension. 5. Atherosclerosis, including left main and 2 vessel coronary artery disease. Assessment for potential risk factor modification, dietary therapy or pharmacologic therapy may be warranted, if clinically indicated. 6. Mild dependent subsegmental atelectasis in the lower lobes of the lungs bilaterally. 7. Hepatic steatosis. 8. Cholelithiasis. 9. Support apparatus, as above.   Electronically Signed   By: Vinnie Langton M.D.   On: 06/07/2014 16:33   Nm Pulmonary Perf And Vent 06/10/2014   CLINICAL DATA:  Difficulty breathing  EXAM: NUCLEAR MEDICINE VENTILATION - PERFUSION LUNG SCAN  Views: Anterior, posterior, RPO, LPO, RAO, LAO -ventilation and perfusion. Lateral images could not be obtained  due to patient's size.  Radionuclide: Technetium 14m DTPA -ventilation; tTechnetium 43m macroaggregated albumin-perfusion  Dose:  40.0 mCi-ventilation; 6.0 mCi-perfusion  Route of administration: Inhalation -ventilation ; intravenous- perfusion  COMPARISON:  Chest radiograph April 10, 2014  FINDINGS: Ventilation: There is cardiomegaly. There are a few subsegmental ventilation defects. There is no segmental level ventilation defect.  Perfusion: There is cardiomegaly. There are a few small subsegmental perfusion defects which match ventilation defects. There is no segmental perfusion defect. There is no significant ventilation/perfusion mismatch.  IMPRESSION: Small ventilation and perfusion defects without appreciable ventilation/ perfusion mismatch. Cardiomegaly. This study constitutes a low probability of pulmonary embolus.   Electronically Signed   By: Lowella Grip III M.D.   On: 06/10/2014 16:36   Dg Chest Port 1 View 06/09/2014   CLINICAL DATA:  History of endotracheal tube  EXAM: PORTABLE CHEST - 1 VIEW  COMPARISON:  06/07/2014  FINDINGS: Endotracheal tube in good position. Left subclavian central venous catheter tip in the SVC also unchanged. NG tube in the stomach  Hypoventilation with bibasilar atelectasis/ infiltrate left greater than right is unchanged from the prior study. Negative for edema or effusion.  IMPRESSION: Hypoventilation with bibasilar consolidation unchanged. No new findings. Support lines in satisfactory  position.   Electronically Signed   By: Franchot Gallo M.D.   On: 06/09/2014 07:37    ASSESSMENT AND PLAN:  Active Problems:   PEA (Pulseless electrical activity)   Anoxic encephalopathy   Cardiac arrest   Acute respiratory failure with hypoxemia   Congestive dilated cardiomyopathy   Essential hypertension   Hypertension   CKD (chronic kidney disease) stage 2, GFR 60-89 ml/min   DM type 2 causing renal disease   BPH (benign prostatic hypertrophy)   Obesity,  morbid  1. VF arrest Primary VF arrest in the setting of non-ischemic cardiomyopathy and LBBB S/p CRTD implant 06-18-14 Device interrogated and functioning normally CXR pending No driving X6 months (pt aware).  2. Non-ischemic cardiomyopathy/congestive heart failure Continue Coreg, Lasix, Lisinopril, Spironolactone Euvolemic on exam today  3. Acute encephalopathy Back to baseline  4. HTN Hypotensive today Discontinue Spironolactone in setting of rising creatinine  5. CKD stage 2 Creat worsened with addition of spironolactone Will discontinue at this time Follow BMET  Chanetta Marshall, NP 06/19/2014 7:58 AM  followup with Mali [name] in few weeks to reevaluate meds Will leave LV lead off as LV- RV interval is <50 msec and lead relatively apical ( we had no good options) Driving restricutions reviewed

## 2014-06-19 NOTE — Progress Notes (Signed)
Physical Therapy Treatment/ Discharge Patient Details Name: Rickey Rice MRN: [id number] DOB: [birth year] Today's Date: 06-25-2014    History of Present Illness Pt adm with cardiac arrest. Extubated 3/15. PMH - Bil TKA, obesity, DM, HTN, LBBB, defibrillator placed 3/24    PT Comments    Pt moving well and able to perform all transfers, gait and mobility without assist and maintaining precautions post op. Pt and wife present and educated for activity progression and precautions. Pt at baseline functional mobility, goals met and no further therapy needs at this time. Pt and wife in agreement. Will sign off.    Follow Up Recommendations  No PT follow up     Equipment Recommendations       Recommendations for Other Services       Precautions / Restrictions Precautions Precautions: Fall    Mobility  Bed Mobility Overal bed mobility: Modified Independent             General bed mobility comments: pt able to transfer supine<>sit with cues for sequence only but no physical assist  Transfers Overall transfer level: Modified independent               General transfer comment: pt maintained no pushing with LUE from low chair  Ambulation/Gait Ambulation/Gait assistance: Modified independent (Device/Increase time) Ambulation Distance (Feet): 550 Feet Assistive device: 4-wheeled walker Gait Pattern/deviations: Step-through pattern;Decreased stride length;Trunk flexed   Gait velocity interpretation: at or above normal speed for age/gender     Stairs            Wheelchair Mobility    Modified Rankin (Stroke Patients Only)       Balance                                    Cognition Arousal/Alertness: Awake/alert Behavior During Therapy: WFL for tasks assessed/performed Overall Cognitive Status: Within Functional Limits for tasks assessed                      Exercises      General Comments        Pertinent Vitals/Pain  Pain Assessment: No/denies pain  HR 75    Home Living                      Prior Function            PT Goals (current goals can now be found in the care plan section) Progress towards PT goals: Goals met/education completed, patient discharged from PT    Frequency       PT Plan Current plan remains appropriate    Co-evaluation             End of Session   Activity Tolerance: Patient tolerated treatment well Patient left: in chair;with call bell/phone within reach;with family/visitor present     Time: 1002-1017 PT Time Calculation (min) (ACUTE ONLY): 15 min  Charges:  $Gait Training: 8-22 mins                    G Codes:      [name] 2014/06/25, 10:27 AM [name], [name]

## 2014-06-22 ENCOUNTER — Ambulatory Visit: Payer: Medicare Other

## 2014-06-22 NOTE — Telephone Encounter (Signed)
Patient contacted regarding discharge from [facility] on Friday 06/19/14.  Patient understands to follow up with provider Truitt Merle, NP on 06/29/14 at 10:30 at [facility] at [address] in [place]. Patient understands discharge instructions? yes Patient understands medications and regiment? yes Patient understands to bring all medications to this visit? yes  The pt states that his weight at home on 3/27 was 318 lbs and that his weight this am was 312 lbs.(his weight at hosp d/c on 3/25 was 304 lbs on hosp scales). He did not weigh himself on 3/26. He has no complaints at this time. He has this office phone number to call if he has any questions or concerns.

## 2014-06-29 ENCOUNTER — Ambulatory Visit (INDEPENDENT_AMBULATORY_CARE_PROVIDER_SITE_OTHER): Payer: Medicare Other | Admitting: *Deleted

## 2014-06-29 ENCOUNTER — Encounter: Payer: Self-pay | Admitting: Nurse Practitioner

## 2014-06-29 ENCOUNTER — Ambulatory Visit (INDEPENDENT_AMBULATORY_CARE_PROVIDER_SITE_OTHER): Payer: Medicare Other | Admitting: Nurse Practitioner

## 2014-06-29 VITALS — BP 116/68 | HR 63 | Resp 20 | Ht 72.0 in | Wt 308.0 lb

## 2014-06-29 DIAGNOSIS — I429 Cardiomyopathy, unspecified: Secondary | ICD-10-CM

## 2014-06-29 DIAGNOSIS — I469 Cardiac arrest, cause unspecified: Secondary | ICD-10-CM | POA: Diagnosis not present

## 2014-06-29 DIAGNOSIS — Z95810 Presence of automatic (implantable) cardiac defibrillator: Secondary | ICD-10-CM | POA: Diagnosis not present

## 2014-06-29 DIAGNOSIS — I255 Ischemic cardiomyopathy: Secondary | ICD-10-CM | POA: Diagnosis not present

## 2014-06-29 DIAGNOSIS — I428 Other cardiomyopathies: Secondary | ICD-10-CM

## 2014-06-29 LAB — CBC
HCT: 31.5 % — ABNORMAL LOW (ref 39.0–52.0)
Hemoglobin: 10.7 g/dL — ABNORMAL LOW (ref 13.0–17.0)
MCHC: 33.9 g/dL (ref 30.0–36.0)
MCV: 93.4 fl (ref 78.0–100.0)
Platelets: 265 K/uL (ref 150.0–400.0)
RBC: 3.38 Mil/uL — ABNORMAL LOW (ref 4.22–5.81)
RDW: 14.8 % (ref 11.5–15.5)
WBC: 5.7 K/uL (ref 4.0–10.5)

## 2014-06-29 LAB — MDC_IDC_ENUM_SESS_TYPE_INCLINIC
Battery Remaining Longevity: 110.4 mo
Brady Statistic RA Percent Paced: 0 %
Brady Statistic RV Percent Paced: 0 %
Date Time Interrogation Session: 20160404113835
HighPow Impedance: 45 Ohm
Implantable Pulse Generator Serial Number: 7240350
Lead Channel Impedance Value: 400 Ohm
Lead Channel Impedance Value: 437.5 Ohm
Lead Channel Impedance Value: 450 Ohm
Lead Channel Pacing Threshold Amplitude: 1 V
Lead Channel Pacing Threshold Pulse Width: 0.5 ms
Lead Channel Sensing Intrinsic Amplitude: 10 mV
Lead Channel Sensing Intrinsic Amplitude: 3.7 mV
Lead Channel Setting Pacing Amplitude: 3.5 V
Lead Channel Setting Pacing Pulse Width: 0.5 ms
Lead Channel Setting Sensing Sensitivity: 0.5 mV
Zone Setting Detection Interval: 250 ms
Zone Setting Detection Interval: 300 ms
Zone Setting Detection Interval: 375 ms

## 2014-06-29 LAB — BASIC METABOLIC PANEL
BUN: 22 mg/dL (ref 6–23)
CO2: 32 mEq/L (ref 19–32)
Calcium: 9.5 mg/dL (ref 8.4–10.5)
Chloride: 105 mEq/L (ref 96–112)
Creatinine, Ser: 1.35 mg/dL (ref 0.40–1.50)
GFR: 66.47 mL/min (ref >60.00)
Glucose, Bld: 130 mg/dL — ABNORMAL HIGH (ref 70–99)
Potassium: 3.8 mEq/L (ref 3.5–5.1)
Sodium: 141 mEq/L (ref 135–145)

## 2014-06-29 LAB — HEPATIC FUNCTION PANEL
ALT: 13 U/L (ref 0–53)
AST: 15 U/L (ref 0–37)
Albumin: 3.6 g/dL (ref 3.5–5.2)
Alkaline Phosphatase: 70 U/L (ref 39–117)
Bilirubin, Direct: 0.1 mg/dL (ref 0.0–0.3)
Total Bilirubin: 0.4 mg/dL (ref 0.2–1.2)
Total Protein: 7 g/dL (ref 6.0–8.3)

## 2014-06-29 LAB — BRAIN NATRIURETIC PEPTIDE: Pro B Natriuretic peptide (BNP): 222 pg/mL — ABNORMAL HIGH (ref 0.0–100.0)

## 2014-06-29 NOTE — Progress Notes (Signed)
Wound check appointment. Steri-strips removed. Wound without redness or edema. Incision edges approximated, wound well healed. Normal device function. Thresholds, sensing, and impedances consistent with implant measurements. Device programmed at 3.5V for extra safety margin until 3 month visit. Histogram distribution appropriate for patient and level of activity. No ventricular arrhythmias noted.  Vibratory alert demonstrated and pt aware to contact ofifice if felt. Patient educated about wound care, arm mobility, lifting restrictions, shock plan--information card/magnet given to pt. Merlin transmitting properly. ROV in 3 months with SK.

## 2014-06-29 NOTE — Progress Notes (Signed)
CARDIOLOGY OFFICE NOTE  Date:  06/29/2014    Rickey Rice Date of Birth: 1940/06/19 Medical Record #[id number]  PCP:  Elyn Peers, MD  Cardiologist:  [facility]    Chief Complaint  Patient presents with  . Post cardiac arrest    TOC/post hospital - seen for Dr. Debara Pickett & [name]     History of Present Illness: Rickey Rice is a 74 y.o. male who presents today for a TOC/post hospital visit. Seen for Dr. Debara Pickett & [name]. He has a history of obesity, prior bilateral knee surgeries, chronic LBBB, and cardiomyopathy.   He suffered cardiac arrest while attending church on June 07, 2014. Initial rhythm was PEA. Subsequently VF. He was successfully resuscitated. This was felt to be due to NICM and LBBB. EF is 40%. He was diuresed 25 pounds. CRT-ICD was placed. Spironolactone held due to worsening CKD. Not to drive for 6 months.  His LV lead was left off as LV- RV interval is <50 msec and lead relatively apical (no good options at implant).   Procedures   Cardiac catheterization plan 3/18: Mild disease, no evidence of obstruction, confirmed EF of 40%  intubation 3/13-3/15  echocardiogram done 3/13: Left bundle branch block, decreased ejection fraction to 40%  Lower extremity Dopplers done 3/14: No evidence of DVT  ICD placement 06/18/2014  Comes back today. Here with his wife. Doing ok. Feels pretty good. No chest pain. Not short of breath. No shocks. No swelling. Weight ok at home. Walking some. Now restricting his salt and doing a better job with that. BP is good at home - not above 086 systolic. He is not lightheaded or dizzy.   Past Medical History  Diagnosis Date  . Diabetes mellitus without complication     type 2  . Hypertension   . Hypothyroidism   . Morbid obesity   . Dyslipidemia   . LBBB (left bundle branch block)   . History of nuclear stress test 04/2012    lexiscan - 2 day protocol; low risk study, evidence of inferrior & apical scar but no ischemia   .  Arthritis   . Shortness of breath     with exertion   . Prostate enlargement   . Cardiac arrest 05/2014    Primary VF arrest with successful resuscitation and s/p ICD implant  . ICD (implantable cardioverter-defibrillator) in place 05/2014    Dr. [name]  . NICM (nonischemic cardiomyopathy)     Past Surgical History  Procedure Laterality Date  . Mouth surgery  1963  . Finger surgery  1954  . Colonoscopy w/ biopsies    . Knee arthroscopy Left   . Total knee arthroplasty Right 02/05/2013    Dr Mayer Camel  . Total knee arthroplasty Right 02/05/2013    Procedure: TOTAL KNEE ARTHROPLASTY;  Surgeon: Kerin Salen, MD;  Location: [facility];  Service: Orthopedics;  Laterality: Right;  . Total knee arthroplasty Left 01/09/2014    DR Mayer Camel  . Total knee arthroplasty Left 01/19/2014    Procedure: LEFT TOTAL KNEE ARTHROPLASTY;  Surgeon: Kerin Salen, MD;  Location: [facility];  Service: Orthopedics;  Laterality: Left;  . Left heart catheterization with coronary angiogram N/A 06/12/2014    Procedure: LEFT HEART CATHETERIZATION WITH CORONARY ANGIOGRAM;  Surgeon: Leonie Man, MD;  Location: [facility] CATH LAB;  Service: Cardiovascular;  Laterality: N/A;  . Bi-ventricular implantable cardioverter defibrillator N/A 06/18/2014    Procedure: BI-VENTRICULAR IMPLANTABLE CARDIOVERTER DEFIBRILLATOR  (CRT-D);  Surgeon: Revonda Standard  [name], MD;  Location: [facility] CATH LAB;  Service: Cardiovascular;  Laterality: N/A;     Medications: Current Outpatient Prescriptions  Medication Sig Dispense Refill  . acetaminophen (TYLENOL) 325 MG tablet Take 1-2 tablets (325-650 mg total) by mouth every 4 (four) hours as needed for mild pain.    Marland Kitchen aspirin EC 81 MG tablet Take 81 mg by mouth daily.    . carvedilol (COREG) 6.25 MG tablet Take 1 tablet (6.25 mg total) by mouth 2 (two) times daily with a meal. 60 tablet 0  . celecoxib (CELEBREX) 200 MG capsule Take 200 mg by mouth daily.    . furosemide (LASIX) 40 MG tablet Take 1 tablet (40 mg total) by  mouth daily. 30 tablet 0  . glimepiride (AMARYL) 4 MG tablet Take 8 mg by mouth daily before breakfast.     . levothyroxine (SYNTHROID, LEVOTHROID) 75 MCG tablet Take 75 mcg by mouth daily before breakfast.    . lisinopril (PRINIVIL,ZESTRIL) 10 MG tablet Take 1 tablet (10 mg total) by mouth daily. 30 tablet 0  . LUTEIN ESTERS PO Take 1 capsule by mouth every other day.     . rosuvastatin (CRESTOR) 10 MG tablet Take 1 tablet (10 mg total) by mouth daily at 6 PM. 30 tablet 0  . saxagliptin HCl (ONGLYZA) 5 MG TABS tablet Take 5 mg by mouth daily.    . tamsulosin (FLOMAX) 0.4 MG CAPS capsule Take 1 capsule by mouth daily.     No current facility-administered medications for this visit.    Allergies: Allergies  Allergen Reactions  . Bee Venom Swelling    Swelling on lips and tongue  . Demerol [Meperidine]   . Pioglitazone     Lips swelling  . Shrimp [Shellfish Allergy]     rash  . Sulfa Antibiotics     Social History: The patient  reports that he has never smoked. He has never used smokeless tobacco. He reports that he does not drink alcohol or use illicit drugs.   Family History: The patient's family history includes Cancer in his mother; Hypertension in his sister; Kidney disease in his brother.   Review of Systems: Please see the history of present illness.   All other systems are reviewed and negative.   Physical Exam: VS:  BP 116/68 mmHg  Pulse 63  Resp 20  Ht 6' (1.829 m)  Wt 308 lb (139.708 kg)  BMI 41.76 kg/m2  SpO2 98% .  BMI Body mass index is 41.76 kg/(m^2).  Wt Readings from Last 3 Encounters:  06/29/14 308 lb (139.708 kg)  06/19/14 308 lb 4.8 oz (139.844 kg)  02/03/14 334 lb (151.501 kg)    General: Pleasant. Well developed, well nourished and in no acute distress. He remains obese.  HEENT: Normal. Neck: Supple, no JVD, carotid bruits, or masses noted.  Cardiac: Regular rate and rhythm. No murmurs, rubs, or gallops. No edema. His ICD is in the left upper  chest  - tegaderm dressing removed - one suture noted. Overall the site looks ok.  Respiratory:  Lungs are clear to auscultation bilaterally with normal work of breathing.  GI: Soft and nontender.  MS: No deformity or atrophy. Gait and ROM intact. Skin: Warm and dry. Color is normal.  Neuro:  Strength and sensation are intact and no gross focal deficits noted.  Psych: Alert, appropriate and with normal affect.   LABORATORY DATA:  EKG:  EKG is not ordered todays.  Lab Results  Component Value Date  WBC 7.8 06/19/2014   HGB 10.3* 06/19/2014   HCT 31.6* 06/19/2014   PLT 256 06/19/2014   GLUCOSE 135* 06/19/2014   TRIG 68 06/10/2014   ALT 637* 06/07/2014   AST 450* 06/07/2014   NA 139 06/19/2014   K 3.9 06/19/2014   CL 102 06/19/2014   CREATININE 1.55* 06/19/2014   BUN 34* 06/19/2014   CO2 25 06/19/2014   TSH 0.344* 06/08/2014   INR 1.17 06/12/2014   HGBA1C 6.7* 06/11/2014    BNP (last 3 results) No results for input(s): BNP in the last 8760 hours.  ProBNP (last 3 results) No results for input(s): PROBNP in the last 8760 hours.   Other Studies Reviewed Today:  Echo Study Conclusions from 05/2014  - Left ventricle: Diffuse hypokinesis with abnormal septal motion from LBBB. The cavity size was moderately dilated. Wall thickness was normal. The estimated ejection fraction was 40%. - Aortic valve: There was trivial regurgitation. - Aorta: Some shadowing artifact normal diameter - Atrial septum: No defect or patent foramen ovale was identified.   CARDIAC CATHETERIZATION REPORT  Left Ventriculography:  EF: Roughly 40 %  Wall Motion: Mobile hypokinesis  Coronary Anatomy: Large draping vessels consistent with nonischemic retinopathy  Dominance: Right  Left Main: Large-caliber vessel bifurcates into the LAD And Circumflex. Angiographically normal. LAD: Large-caliber vessel that bifurcates proximally into a large trifurcating first diagonal branch and the equal  sized follow on LAD that has one more diagonal branch before it reaches down around the apex perfusing the distal inferolateral apex. Diffuse mild disease throughout but nothing more than 30%.  D1: Very large caliber trifurcating vessel. The superior branch is also bifurcates and has some moderate disease. The rest of the diagonal branch does not have any significant lesions in covers the entire anterolateral/lateral wall.  D2: All caliber vessel from the mid LAD. Angiographically normal.  Left Circumflex: Moderate caliber, nondominant vessel that courses as a new circumflex giving rise to a small posterior lateral branches very small proximal OM.   RCA: Normal caliber, dominant vessel with a high bifurcation. There a branch that appears to be an RV marginal branch but ends of coursing as a posterior lateral branch and the follow on RCA that continues to be the PDA and a small posterolateral system arising from it as well. Diffuse mild luminal irregularities but nothing significant.  POST-OPERATIVE DIAGNOSIS:   Angiographically only mild to moderate disease in a right dominant system.  Mildly elevated LVEDP of 15 mmHg.  Likely nonischemic cardiomyopathy  Would suspect a nonischemic etiology for PE arrest    Assessment/Plan: 1. S/P cardiac arrest with primary VF due to LBBB and NICM - has ICD in place - to follow with Dr. [name]. Understands no driving for 6 months. Doing well clinically. Device to be checked today as well.   2. NICM - need to titrate medicines - BP little soft - I have left him on his current regimen. Discussed need to restrict salt, weigh daily, extra Lasix prn, etc.   3. Nonobstructive CAD - manage medically   4. HTN - BP is good.   5. Increased LFTs post arrest - recheck today - he is also on statin - this should be monitored closely.   6. Anemia post arrest - recheck CBC today  Current medicines are reviewed with the patient today.  The patient does not have  concerns regarding medicines other than what has been noted above.  The following changes have been made:  See above.  Labs/ tests ordered today include:    Orders Placed This Encounter  Procedures  . Brain natriuretic peptide  . Basic metabolic panel  . CBC  . Hepatic function panel     Disposition:   FU with Dr. Debara Pickett in 4 weeks - has appointment later this month.   Patient is agreeable to this plan and will call if any problems develop in the interim.   Signed: Burtis Junes, RN, ANP-C 06/29/2014 10:51 AM  [facility] [address] [place], [place]  [address] Phone: [phone] Fax: [phone]

## 2014-06-29 NOTE — Patient Instructions (Addendum)
We will be checking the following labs today BMET, HPF, CBC and BNP  See Dr. Debara Pickett back as planned later this month  Restrict salt to less than 1500 mg of sodium  Weigh daily  Use extra dose of Lasix for weight gain of 3 pounds over one night  Stay on current medicines - try to restrict your use of Celebrex  Ok to walk daily - goal is to work towards 30 minutes  Here are my tips to lose weight:  1. Drink only water. You do not need milk, juice, tea, soda or diet soda.  2. Do not eat anything "white". This includes white bread, potatoes, rice or mayo  3. Stay away from fried foods and sweets  4. Your portion should be the size of the palm of your hand.  5. Know what your weaknesses are and avoid.  6. Find an exercise you like and do it every day for 45 to 60 minutes.        Call the [place] office at [phone] if you have any questions, problems or concerns.

## 2014-06-30 ENCOUNTER — Encounter: Payer: Self-pay | Admitting: *Deleted

## 2014-07-02 ENCOUNTER — Telehealth: Payer: Self-pay | Admitting: Internal Medicine

## 2014-07-02 NOTE — Telephone Encounter (Signed)
Routing to [place] in device clinic

## 2014-07-02 NOTE — Telephone Encounter (Signed)
New message      Pt had defibuator put in on 06-18-14.  He now has a 100.6 low grade temp.  Should he be concerned?

## 2014-07-03 ENCOUNTER — Encounter: Payer: Self-pay | Admitting: Internal Medicine

## 2014-07-03 NOTE — Telephone Encounter (Signed)
Temperature started morning of 07/02/14. 99.9 on 07/01/14. Pt's temp eventually dropped to 97.9 yesterday. States today is 100.6.   Pt has had nagging cough since leaving the hospital. The cough was present while in-patient. Pt also has "not felt his normal peppy self." He feels better today. Pt does not have device pocket discoloration. Pt states device pocket, compared to surrounding area, is not warmer (confirmed by spouse).  At this time, this does not seem device related. I advised pt to either see his PCP or visit urgent care to address his temperature. Pt stated he will contact Dr. Fransico Setters office for further advice.

## 2014-07-04 ENCOUNTER — Encounter: Payer: Self-pay | Admitting: Internal Medicine

## 2014-07-15 ENCOUNTER — Other Ambulatory Visit: Payer: Self-pay

## 2014-07-15 MED ORDER — FUROSEMIDE 40 MG PO TABS: 40.0000 mg | ORAL_TABLET | Freq: Every day | ORAL | Status: DC

## 2014-07-15 MED ORDER — CARVEDILOL 6.25 MG PO TABS: 6.2500 mg | ORAL_TABLET | Freq: Two times a day (BID) | ORAL | Status: DC

## 2014-07-15 MED ORDER — LISINOPRIL 10 MG PO TABS: 10.0000 mg | ORAL_TABLET | Freq: Every day | ORAL | Status: DC

## 2014-07-24 ENCOUNTER — Ambulatory Visit (INDEPENDENT_AMBULATORY_CARE_PROVIDER_SITE_OTHER): Payer: Medicare Other | Admitting: Internal Medicine

## 2014-07-24 ENCOUNTER — Encounter: Payer: Self-pay | Admitting: Internal Medicine

## 2014-07-24 VITALS — BP 106/68 | HR 76 | Ht 72.0 in | Wt 307.4 lb

## 2014-07-24 DIAGNOSIS — I420 Dilated cardiomyopathy: Secondary | ICD-10-CM

## 2014-07-24 DIAGNOSIS — I10 Essential (primary) hypertension: Secondary | ICD-10-CM | POA: Diagnosis not present

## 2014-07-24 DIAGNOSIS — I447 Left bundle-branch block, unspecified: Secondary | ICD-10-CM

## 2014-07-24 DIAGNOSIS — Z95810 Presence of automatic (implantable) cardiac defibrillator: Secondary | ICD-10-CM | POA: Diagnosis not present

## 2014-07-24 NOTE — Patient Instructions (Signed)
Your physician recommends that you schedule a follow-up appointment in: 3 months with Dr. [name].  

## 2014-07-26 DIAGNOSIS — Z95810 Presence of automatic (implantable) cardiac defibrillator: Secondary | ICD-10-CM | POA: Insufficient documentation

## 2014-07-26 NOTE — Progress Notes (Signed)
OFFICE NOTE  Chief Complaint:  Hospital follow-up  Primary Care Physician: Elyn Peers, MD  HPI:  Rickey Rice  is a 74 year old gentleman with a history of diabetes, hypertension, hypothyroidism, and morbid obesity. In 2010 he had complaints of chest pain and underwent stress testing with [facility] which was a 2-day nuclear stress test and was apparently negative. Recently he underwent a colonoscopy and a preoperative EKG was abnormal. I unfortunately do not have that EKG to review, but I did review the EKG from your office on April 09, 2012 which showed a borderline intraventricular conduction delay, a sinus rhythm at 66, and poor R-wave progression. Today in the office he has an abnormal EKG demonstrating a left bundle branch block with a QRS duration of 168 msec, heart rate of 78 in sinus. His only symptoms are shortness of breath with exertion. He underwent evaluation of his left bundle branch block in February 2014. This is a 2 day nuclear stress test which was negative for ischemia. There was a small inferior and apical defect which could represent scar versus artifact. EF was mildly reduced at 49%.    Rickey Rice returns today for followup. He underwent knee replacement surgery as we had authorized him to do. He is tolerated surgery without any complications. He reports that he is recovering fairly well and is more mobile than he had been in the past. He started to do some exercises at the [facility] and also this will translate into weight loss. Blood pressure has been stable he denies any chest pain.  He had blood work in January from his primary care provider which showed a total cholesterol of 128, HDL 51, triglycerides 53 and LDL 65. His hemoglobin A1c was 6.2.  Rickey Rice is now contemplating lethargy. Unfortunately has not been able to walk enough to lose weight in fact has gained some weight. 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 He feels like it's time to retire work on his generalized health. I would tend to agree with this.  I saw Rickey Rice back in the office today. He recently followed up from the hospital after having a sudden cardiac arrest. Unfortunately he was revived and is now status post AICD. He was to have CRT-D therapy, however there was difficulty with the LV lead, therefore he only has a single ventricle pacing with defibrillator functions. He is tolerating this well and had a small episode with fever postoperatively, but this was thought to be due to possibly a UTI. There is no evidence of pacemaker site fluctuance or swelling. Overall he feels he is doing well. He started exercising is managed to lose some weight. He is less short of breath and is more active.  PMHx:  Past Medical History  Diagnosis Date  . Diabetes mellitus without complication     type 2  . Hypertension   . Hypothyroidism   . Morbid obesity   . Dyslipidemia   . LBBB (left bundle branch block)   . History of nuclear stress test 04/2012    lexiscan - 2 day protocol; low risk study, evidence of inferrior & apical scar but no ischemia   . Arthritis   . Shortness of breath     with exertion   . Prostate enlargement   . Cardiac arrest 05/2014    Primary VF arrest with successful resuscitation and s/p ICD implant  . ICD (implantable cardioverter-defibrillator) in place 05/2014  Dr. [name]  . NICM (nonischemic cardiomyopathy)     Past Surgical History  Procedure Laterality Date  . Mouth surgery  1963  . Finger surgery  1954  . Colonoscopy w/ biopsies    . Knee arthroscopy Left   . Total knee arthroplasty Right 02/05/2013    Dr Mayer Camel  . Total knee arthroplasty Right 02/05/2013    Procedure: TOTAL KNEE ARTHROPLASTY;  Surgeon: Kerin Salen, MD;  Location: [place];  Service: Orthopedics;  Laterality: Right;  . Total knee arthroplasty Left 01/09/2014    DR Mayer Camel  . Total knee arthroplasty Left 01/19/2014    Procedure: LEFT  TOTAL KNEE ARTHROPLASTY;  Surgeon: Kerin Salen, MD;  Location: [place];  Service: Orthopedics;  Laterality: Left;  . Left heart catheterization with coronary angiogram N/A 06/12/2014    Procedure: LEFT HEART CATHETERIZATION WITH CORONARY ANGIOGRAM;  Surgeon: Leonie Man, MD;  Location: [facility] CATH LAB;  Service: Cardiovascular;  Laterality: N/A;  . Bi-ventricular implantable cardioverter defibrillator N/A 06/18/2014    Procedure: BI-VENTRICULAR IMPLANTABLE CARDIOVERTER DEFIBRILLATOR  (CRT-D);  Surgeon: Deboraha Sprang, MD;  Location: [facility] CATH LAB;  Service: Cardiovascular;  Laterality: N/A;    FAMHx:  Family History  Problem Relation Age of Onset  . Cancer Mother   . Kidney disease Brother   . Hypertension Sister     SOCHx:   reports that he has never smoked. He has never used smokeless tobacco. He reports that he does not drink alcohol or use illicit drugs.  ALLERGIES:  Allergies  Allergen Reactions  . Bee Venom Swelling    Swelling on lips and tongue  . Demerol [Meperidine]   . Pioglitazone     Lips swelling  . Shrimp [Shellfish Allergy]     rash  . Sulfa Antibiotics     ROS: A comprehensive review of systems was negative except for: Constitutional: positive for weight loss Respiratory: positive for dyspnea on exertion Musculoskeletal: positive for stiff joints and knee pain  HOME MEDS: Current Outpatient Prescriptions  Medication Sig Dispense Refill  . acetaminophen (TYLENOL) 325 MG tablet Take 1-2 tablets (325-650 mg total) by mouth every 4 (four) hours as needed for mild pain.    Marland Kitchen aspirin EC 81 MG tablet Take 81 mg by mouth daily.    . carvedilol (COREG) 6.25 MG tablet Take 1 tablet (6.25 mg total) by mouth 2 (two) times daily with a meal. 60 tablet 2  . furosemide (LASIX) 40 MG tablet Take 1 tablet (40 mg total) by mouth daily. 30 tablet 2  . glimepiride (AMARYL) 4 MG tablet Take 8 mg by mouth daily before breakfast.     . levothyroxine (SYNTHROID, LEVOTHROID) 75 MCG  tablet Take 75 mcg by mouth daily before breakfast.    . lisinopril (PRINIVIL,ZESTRIL) 10 MG tablet Take 1 tablet (10 mg total) by mouth daily. 30 tablet 2  . LUTEIN ESTERS PO Take 1 capsule by mouth every other day.     . rosuvastatin (CRESTOR) 10 MG tablet Take 1 tablet (10 mg total) by mouth daily at 6 PM. 30 tablet 0  . saxagliptin HCl (ONGLYZA) 5 MG TABS tablet Take 5 mg by mouth daily.    . tamsulosin (FLOMAX) 0.4 MG CAPS capsule Take 1 capsule by mouth daily.     No current facility-administered medications for this visit.    LABS/IMAGING: No results found for this or any previous visit (from the past 48 hour(s)). No results found.  VITALS: BP 106/68 mmHg  Pulse 76  Ht 6' (1.829 m)  Wt 307 lb 6.4 oz (139.436 kg)  BMI 41.68 kg/m2  EXAM: General appearance: alert, no distress and morbidly obese Neck: no carotid bruit, no JVD and thyroid not enlarged, symmetric, no tenderness/mass/nodules Lungs: clear to auscultation bilaterally and AICD in left upper chest Heart: regular rate and rhythm, S1, S2 normal, no murmur, click, rub or gallop Abdomen: soft, non-tender; bowel sounds normal; no masses,  no organomegaly and Morbidly obese Extremities: extremities normal, atraumatic, no cyanosis or edema Pulses: 2+ and symmetric Skin: Skin color, texture, turgor normal. No rashes or lesions  EKG: Deferred  ASSESSMENT: 1. Status post aborted sudden cardiac death-status post placement of a St. Jude Bi-V AICD (LV lead is disabled due to dysfunction) 2. Negative nuclear stress test for ischemia, fixed inferoapical defect suggesting possible scar versus artifact, EF 49% 3.   Diabetes type 2 on oral medications - A1c 6.2 4.   Hypertension-well controlled 5.   Dyslipidemia on statin - well controlled  PLAN: 1.   Mr. Trumbull is recovering from aborted sudden cardiac death. He underwent defibrillator placement with a St. Jude CRT-D device, however the left ventricular lead is not  functional. EF in the hospitals down to 40%. He was on Aldactone however this was held due to hyperkalemia. He's currently on carvedilol, Lasix and lisinopril. For now we'll continue his current medications. I've encouraged him to continue exercise and work on weight loss, as he's noticed he's feeling much better and is related to this. I'll plan to see him back in 3 months for close follow-up.  Pixie Casino, MD, [facility] Attending Cardiologist [place], [place] 07/26/2014, 3:40 PM

## 2014-07-27 ENCOUNTER — Encounter: Payer: Self-pay | Admitting: Internal Medicine

## 2014-07-29 ENCOUNTER — Ambulatory Visit (HOSPITAL_COMMUNITY): Payer: Medicare Other | Attending: Orthopedic Surgery | Admitting: Physical Therapy

## 2014-07-29 DIAGNOSIS — R6889 Other general symptoms and signs: Secondary | ICD-10-CM

## 2014-07-29 DIAGNOSIS — R2689 Other abnormalities of gait and mobility: Secondary | ICD-10-CM | POA: Diagnosis not present

## 2014-07-29 DIAGNOSIS — R269 Unspecified abnormalities of gait and mobility: Secondary | ICD-10-CM

## 2014-07-29 DIAGNOSIS — M25662 Stiffness of left knee, not elsewhere classified: Secondary | ICD-10-CM | POA: Diagnosis not present

## 2014-07-29 DIAGNOSIS — Z96652 Presence of left artificial knee joint: Secondary | ICD-10-CM | POA: Insufficient documentation

## 2014-07-29 DIAGNOSIS — M6281 Muscle weakness (generalized): Secondary | ICD-10-CM | POA: Diagnosis present

## 2014-07-29 DIAGNOSIS — R293 Abnormal posture: Secondary | ICD-10-CM | POA: Diagnosis not present

## 2014-07-29 DIAGNOSIS — R29898 Other symptoms and signs involving the musculoskeletal system: Secondary | ICD-10-CM

## 2014-07-29 NOTE — Therapy (Signed)
[place], [place], [address] Phone: [phone]   Fax:  [phone]  Physical Therapy Evaluation  Patient Details  Name: Rickey Rice MRN: [id number] Date of Birth: [birth year] Referring Provider:  [name], MD  Encounter Date: 07/29/2014      PT End of Session - 07/29/14 1153    Visit Number 1   Number of Visits 12   Date for PT Re-Evaluation 08/26/14   Authorization Type [employer] Medicare    Authorization Time Period 07/29/14 to 09/28/14   Authorization - Visit Number 1   Authorization - Number of Visits 10   PT Start Time 0933   PT Stop Time 1011   PT Time Calculation (min) 38 min   Activity Tolerance Patient tolerated treatment well   Behavior During Therapy [facility] for tasks assessed/performed      Past Medical History  Diagnosis Date  . Diabetes mellitus without complication     type 2  . Hypertension   . Hypothyroidism   . Morbid obesity   . Dyslipidemia   . LBBB (left bundle branch block)   . History of nuclear stress test 04/2012    lexiscan - 2 day protocol; low risk study, evidence of inferrior & apical scar but no ischemia   . Arthritis   . Shortness of breath     with exertion   . Prostate enlargement   . Cardiac arrest 05/2014    Primary VF arrest with successful resuscitation and s/p ICD implant  . ICD (implantable cardioverter-defibrillator) in place 05/2014    Dr. [name]  . NICM (nonischemic cardiomyopathy)     Past Surgical History  Procedure Laterality Date  . Mouth surgery  1963  . Finger surgery  1954  . Colonoscopy w/ biopsies    . Knee arthroscopy Left   . Total knee arthroplasty Right 02/05/2013    Dr Mayer Camel  . Total knee arthroplasty Right 02/05/2013    Procedure: TOTAL KNEE ARTHROPLASTY;  Surgeon: Kerin Salen, MD;  Location: [place];  Service: Orthopedics;  Laterality: Right;  . Total knee arthroplasty Left 01/09/2014    DR Mayer Camel  . Total knee arthroplasty Left  01/19/2014    Procedure: LEFT TOTAL KNEE ARTHROPLASTY;  Surgeon: Kerin Salen, MD;  Location: [place];  Service: Orthopedics;  Laterality: Left;  . Left heart catheterization with coronary angiogram N/A 06/12/2014    Procedure: LEFT HEART CATHETERIZATION WITH CORONARY ANGIOGRAM;  Surgeon: Leonie Man, MD;  Location: [facility] CATH LAB;  Service: Cardiovascular;  Laterality: N/A;  . Bi-ventricular implantable cardioverter defibrillator N/A 06/18/2014    Procedure: BI-VENTRICULAR IMPLANTABLE CARDIOVERTER DEFIBRILLATOR  (CRT-D);  Surgeon: Deboraha Sprang, MD;  Location: [facility] CATH LAB;  Service: Cardiovascular;  Laterality: N/A;    There were no vitals filed for this visit.  Visit Diagnosis:  Weakness of left lower extremity - Plan: PT plan of care cert/re-cert  Proximal muscle weakness - Plan: PT plan of care cert/re-cert  Abnormality of gait - Plan: PT plan of care cert/re-cert  Decreased functional activity tolerance - Plan: PT plan of care cert/re-cert  Poor posture - Plan: PT plan of care cert/re-cert      Subjective Assessment - 07/29/14 0937    Subjective Patient states he feels pretty good on an average day, [place] [facility] [place] [employer] and [place] [employer]. Difficulty with stairs.    Pertinent History Patient had his left knee replaced in October 2015, but the knee still  just feels weak and he continues to feel like he's a little unstable moving around. After patient had pacemaker put in he was put on Celebrex, and as such as been experiencing increased pain in his knee.    How long can you sit comfortably? unlimited    How long can you stand comfortably? somewhat limited    How long can you walk comfortably? has not tried to push himself with gait distances   Patient Stated Goals get knee a lot stronger    Currently in Pain? No/denies  5/10 at worst             [facility] PT Assessment - 07/29/14 0001    Assessment   Medical Diagnosis L knee weakness, ROM deficit    Onset Date 07/08/14   approximate    Next MD Visit October for knee doctor   heart doctor in July; GP in July as well    Precautions   Precautions ICD/Pacemaker   Restrictions   Weight Bearing Restrictions No   Balance Screen   Has the patient fallen in the past 6 months Yes   How many times? chair broke and patient fell onto the ground   Has the patient had a decrease in activity level because of a fear of falling?  Yes   Is the patient reluctant to leave their home because of a fear of falling?  No   Prior Function   Level of Independence Independent with basic ADLs;Independent with gait;Independent with transfers   Vocation Retired   Lawyer, wants to travel some    Posture/Postural Control   Posture Comments increased lumbar lordosis, flat thoracic spine, flexed trunk, flexed knees    AROM   Right Hip External Rotation  30   Right Hip Internal Rotation  31   Left Hip External Rotation  33   Left Hip Internal Rotation  28   Left Knee Extension 4   Left Knee Flexion 121   Right Ankle Dorsiflexion 14   Left Ankle Dorsiflexion 16   Strength   Right Hip Flexion 2/5   Right Hip ABduction 3+/5   Left Hip Flexion 2/5   Left Hip ABduction 2+/5   Right Knee Flexion 4-/5   Right Knee Extension 4/5   Left Knee Flexion 3/5   Left Knee Extension 4-/5   Right Ankle Dorsiflexion 5/5   Left Ankle Dorsiflexion 4+/5   Ambulation/Gait   Gait Comments reduced DF L ankle, reduced TKE L, reduced step lengths, flexed trunk, reduced rotation bilateral hips and trunk, reduced arm swing                            PT Education - 07/29/14 1153    Education provided Yes   Education Details Prognosis; HEP; plan of care    Person(s) Educated Patient   Methods Explanation;Demonstration;Handout   Comprehension Verbalized understanding;Returned demonstration;Need further instruction          PT Short Term Goals - 07/29/14 1200    PT SHORT TERM GOAL #1   Title Patient will  demonstrate at least 4/5 to 4+/5 strength in bilateral lower extremities and at least 4-/5 strength in bilateral proximal hip musculature    Time 3   Period Weeks   Status New   PT SHORT TERM GOAL #2   Title Patient will demonstrate 0 degrees L knee extension and at least 125 degrees L knee flexion    Time 3  Period Weeks   Status New   PT SHORT TERM GOAL #3   Title Patient will be able to verbalize the importance of improved posture and will consistently demonstrate improved posture during all functional tasks and activities    Time 3   Period Weeks   Status New   PT SHORT TERM GOAL #4   Title Patient will demonstrate at least 40 degrees bilateral hip ER and at least 37 degrees bilateral hip IR    Time 3   Period Weeks   Status New           PT Long Term Goals - 2014/08/10 07-06-1200    PT LONG TERM GOAL #1   Title Patient will be independent in correctly and consistently performing appropriate HEP    Time 6   Period Weeks   Status New   PT LONG TERM GOAL #2   Title Patient will be able to ascend/descend full flight of stairs with no railings, good mechanics with no circumduction or hip hiking, and good mechanics and control with stair descent    Time 6   Period Weeks   Status New   PT LONG TERM GOAL #3   Title Patient will be able to participate in functional standing activities for at least 2-3 hours with no pain or feelings of weakness/fatigue in L lower extremity    Time 6   Period Weeks   Status New   PT LONG TERM GOAL #4   Title Patient will be unlimited in ambulation distances over even and uneven surfaces without a device and will not experience weakness or fatigue in knee during gait periods    Time 6   Period Weeks   Status New               Plan - 2014/08/10 1155    Clinical Impression Statement Patient presents with reduced strength in bilateral lower extremities and proximal musculature, reduced mobility in bilateral hips/bilateral ankles/L knee, gait and  postural deviations, intermittent pain L knee, and overall reduced functional task performance skills. The patient has had recent cardiac problems and recently had a pacemaker installed; he states that he goes to the gym every day and often rides the Nustep but has not been pushing as hard on the Nustep recently due to cardiac involvement. Patient states that his left knee just generally feels weak  especially during functional tasks and walking. Patient will benefit from skilled PT services in order to address these deficits and to assist him in reaching an optimal level of function.    Pt will benefit from skilled therapeutic intervention in order to improve on the following deficits Abnormal gait;Decreased coordination;Decreased range of motion;Difficulty walking;Impaired flexibility;Postural dysfunction;Decreased activity tolerance;Decreased balance;Pain;Decreased mobility;Decreased strength   Rehab Potential Good   PT Frequency 2x / week   PT Duration 6 weeks   PT Treatment/Interventions ADLs/Self Care Home Management;Gait training;Neuromuscular re-education;Stair training;Functional mobility training;Patient/family education;Cryotherapy;Therapeutic activities;Therapeutic exercise;Manual techniques;Balance training   PT Next Visit Plan Review HEP and goals; functional strengthening and stretching as appropriate    PT Home Exercise Plan given    Consulted and Agree with Plan of Care Patient          G-Codes - 08/10/2014 07/07/10    Functional Assessment Tool Used Based on skilled clinical assessment focusing on general strength, gait abnormalities, postural deviations   Functional Limitation Mobility: Walking and moving around   Mobility: Walking and Moving Around Current Status (C5852) At least 40 percent but less  than 60 percent impaired, limited or restricted   Mobility: Walking and Moving Around Goal Status [phone]) At least 20 percent but less than 40 percent impaired, limited or restricted        Problem List Patient Active Problem List   Diagnosis Date Noted  . S/P implantation of automatic cardioverter/defibrillator (AICD) 07/26/2014  . Central venous catheter in place   . Hypertension 06/11/2014  . CKD (chronic kidney disease) stage 2, GFR 60-89 ml/min 06/11/2014  . DM type 2 causing renal disease 06/11/2014  . BPH (benign prostatic hypertrophy) 06/11/2014  . Obesity, morbid 06/11/2014  . Essential hypertension   . Congestive dilated cardiomyopathy 06/08/2014  . Cardiac arrest   . Acute respiratory failure with hypoxemia   . PEA (Pulseless electrical activity) 06/07/2014  . Anoxic encephalopathy   . BPH (benign prostatic hyperplasia) 01/24/2014  . Degenerative arthritis of left knee 01/19/2014  . Arthritis of left knee 01/19/2014  . Unspecified constipation 03/31/2013  . Difficulty in walking(719.7) 03/12/2013  . Stiffness of right knee 03/12/2013  . Right leg weakness 03/12/2013  . Osteoarthritis of right knee 02/06/2013  . LBBB (left bundle branch block) 01/07/2013  . Preoperative cardiovascular examination 01/07/2013  . DM2 (diabetes mellitus, type 2) 01/07/2013  . Hypothyroidism 01/07/2013  . Morbid obesity 01/07/2013  . HTN (hypertension) 01/07/2013  . Dyslipidemia 01/07/2013    Deniece Ree PT, DPT [phone]  [place] [address] [place], [place], [address] Phone: [phone]   Fax:  [phone]

## 2014-07-29 NOTE — Patient Instructions (Signed)
Bridging   Slowly raise buttocks from floor, keeping stomach tight. Repeat __10__ times per set. Do __1__ sets per session. Do __2__ sessions per day.  http://orth.exer.us/1096   Copyright  VHI. All rights reserved.   ABDUCTION: Standing (Active)   Stand, feet flat. Lift right leg out to side. Use __0_ lbs. Complete _1__ sets of _10__ repetitions. Perform _2__ sessions per day.  http://gtsc.exer.us/110   Copyright  VHI. All rights reserved.   EXTENSION: Standing (Active)   Stand, both feet flat. Draw right leg behind body as far as possible. Use _0__ lbs. Complete _1__ sets of _10__ repetitions. Perform _2__ sessions per day.  http://gtsc.exer.us/76   Copyright  VHI. All rights reserved.   Functional Quadriceps: Sit to Stand   Sit on edge of chair, feet flat on floor. Stand upright, extending knees fully. Count to three as you lower from standing to sitting.  Repeat __5__ times per set. Do __1__ sets per session. Do _2___ sessions per day.  http://orth.exer.us/734   Copyright  VHI. All rights reserved.

## 2014-08-04 ENCOUNTER — Ambulatory Visit (HOSPITAL_COMMUNITY): Payer: Medicare Other | Admitting: Physical Therapy

## 2014-08-04 ENCOUNTER — Encounter: Payer: Self-pay | Admitting: *Deleted

## 2014-08-04 ENCOUNTER — Telehealth: Payer: Self-pay | Admitting: Internal Medicine

## 2014-08-04 DIAGNOSIS — M6281 Muscle weakness (generalized): Secondary | ICD-10-CM | POA: Diagnosis not present

## 2014-08-04 DIAGNOSIS — R293 Abnormal posture: Secondary | ICD-10-CM

## 2014-08-04 DIAGNOSIS — R29898 Other symptoms and signs involving the musculoskeletal system: Secondary | ICD-10-CM

## 2014-08-04 DIAGNOSIS — R6889 Other general symptoms and signs: Secondary | ICD-10-CM

## 2014-08-04 DIAGNOSIS — R269 Unspecified abnormalities of gait and mobility: Secondary | ICD-10-CM

## 2014-08-04 NOTE — Telephone Encounter (Signed)
Pt need his teeth to be cleaned,he needs clarence for this. Dr Dorena Cookey is the dentist-did not have phone or fax number.

## 2014-08-04 NOTE — Therapy (Addendum)
[name], [place], [address] Phone: [phone]   Fax:  [phone]  Physical Therapy Treatment  Patient Details  Name: Rickey Rice MRN: [id number] Date of Birth: 12-27-1940 Referring Provider:  [name], MD  Encounter Date: 08/04/2014      PT End of Session - 08/04/14 1010    Visit Number 2   Number of Visits 12   Date for PT Re-Evaluation 08/26/14   Authorization Type [employer]    Authorization Time Period 07/29/14 to 09/28/14   Authorization - Visit Number 2   Authorization - Number of Visits 10   Activity Tolerance Patient tolerated treatment well   Behavior During Therapy [facility] for tasks assessed/performed      Past Medical History  Diagnosis Date  . Diabetes mellitus without complication     type 2  . Hypertension   . Hypothyroidism   . Morbid obesity   . Dyslipidemia   . LBBB (left bundle branch block)   . History of nuclear stress test 04/2012    lexiscan - 2 day protocol; low risk study, evidence of inferrior & apical scar but no ischemia   . Arthritis   . Shortness of breath     with exertion   . Prostate enlargement   . Cardiac arrest 05/2014    Primary VF arrest with successful resuscitation and s/p ICD implant  . ICD (implantable cardioverter-defibrillator) in place 05/2014    Dr. [name]  . NICM (nonischemic cardiomyopathy)     Past Surgical History  Procedure Laterality Date  . Mouth surgery  1963  . Finger surgery  1954  . Colonoscopy w/ biopsies    . Knee arthroscopy Left   . Total knee arthroplasty Right 02/05/2013    Dr Mayer Camel  . Total knee arthroplasty Right 02/05/2013    Procedure: TOTAL KNEE ARTHROPLASTY;  Surgeon: Kerin Salen, MD;  Location: [place];  Service: Orthopedics;  Laterality: Right;  . Total knee arthroplasty Left 01/09/2014    DR Mayer Camel  . Total knee arthroplasty Left 01/19/2014    Procedure: LEFT TOTAL KNEE ARTHROPLASTY;  Surgeon: Kerin Salen, MD;   Location: [place];  Service: Orthopedics;  Laterality: Left;  . Left heart catheterization with coronary angiogram N/A 06/12/2014    Procedure: LEFT HEART CATHETERIZATION WITH CORONARY ANGIOGRAM;  Surgeon: Leonie Man, MD;  Location: [facility] CATH LAB;  Service: Cardiovascular;  Laterality: N/A;  . Bi-ventricular implantable cardioverter defibrillator N/A 06/18/2014    Procedure: BI-VENTRICULAR IMPLANTABLE CARDIOVERTER DEFIBRILLATOR  (CRT-D);  Surgeon: Deboraha Sprang, MD;  Location: [facility] CATH LAB;  Service: Cardiovascular;  Laterality: N/A;    There were no vitals filed for this visit.  Visit Diagnosis:  Weakness of left lower extremity  Proximal muscle weakness  Abnormality of gait  Decreased functional activity tolerance  Poor posture      Subjective Assessment - 08/04/14 1010    Subjective Patient states he had a very good weekend, knee pain around 4/10 but states he didn't take a tylenol this morning.    Pertinent History Patient had his left knee replaced in October 2015, but the knee still just feels weak and he continues to feel like he's a little unstable moving around. After patient had pacemaker put in he was taken off Celebrex, and as such as been experiencing increased pain in his knee.  [facility] Adult PT Treatment/Exercise - 08/04/14 0001    Knee/Hip Exercises: Stretches   Active Hamstring Stretch 3 reps;30 seconds   Active Hamstring Stretch Limitations 12 inch box    Hip Flexor Stretch 2 reps;30 seconds   Hip Flexor Stretch Limitations on first stair    Piriformis Stretch 2 reps;30 seconds   Piriformis Stretch Limitations supine    Gastroc Stretch 3 reps;30 seconds   Gastroc Stretch Limitations slantboard    Knee/Hip Exercises: Standing   Heel Raises 1 set;15 reps   Heel Raises Limitations floor   Forward Lunges Both;1 set;10 reps   Forward Lunges Limitations 4 inch step    Side Lunges Both;1 set;10 reps   Side Lunges Limitations 4  inch box    Forward Step Up Both;1 set;10 reps   Forward Step Up Limitations 4 inch step    Step Down Both;1 set;10 reps   Step Down Limitations 4 inch step    Functional Squat 1 set;10 reps   Functional Squat Limitations toes neutral, cues for form    Rocker Board Limitations x20AP, x20 lateral U HHA    Other Standing Knee Exercises 3D hip excursions neutral stance 1x10   Other Standing Knee Exercises Sidestepping 2x50ft for hip ABD endurance                 PT Education - 08/04/14 0933    Education provided Yes   Education Details reviewed goals on initial evaluation    Person(s) Educated Patient   Methods Explanation   Comprehension Verbalized understanding          PT Short Term Goals - 07/29/14 1200    PT SHORT TERM GOAL #1   Title Patient will demonstrate at least 4/5 to 4+/5 strength in bilateral lower extremities and at least 4-/5 strength in bilateral proximal hip musculature    Time 3   Period Weeks   Status New   PT SHORT TERM GOAL #2   Title Patient will demonstrate 0 degrees L knee extension and at least 125 degrees L knee flexion    Time 3   Period Weeks   Status New   PT SHORT TERM GOAL #3   Title Patient will be able to verbalize the importance of improved posture and will consistently demonstrate improved posture during all functional tasks and activities    Time 3   Period Weeks   Status New   PT SHORT TERM GOAL #4   Title Patient will demonstrate at least 40 degrees bilateral hip ER and at least 37 degrees bilateral hip IR    Time 3   Period Weeks   Status New           PT Long Term Goals - 07/29/14 1202    PT LONG TERM GOAL #1   Title Patient will be independent in correctly and consistently performing appropriate HEP    Time 6   Period Weeks   Status New   PT LONG TERM GOAL #2   Title Patient will be able to ascend/descend full flight of stairs with no railings, good mechanics with no circumduction or hip hiking, and good  mechanics and control with stair descent    Time 6   Period Weeks   Status New   PT LONG TERM GOAL #3   Title Patient will be able to participate in functional standing activities for at least 2-3 hours with no pain or feelings of weakness/fatigue in L lower extremity    Time  6   Period Weeks   Status New   PT LONG TERM GOAL #4   Title Patient will be unlimited in ambulation distances over even and uneven surfaces without a device and will not experience weakness or fatigue in knee during gait periods    Time 6   Period Weeks   Status New               Plan - 08/04/14 1010    Clinical Impression Statement Introduced functional stretching and strengthening routine; patient tolerated session well but did require rest break approx half way through session. Reviewed goals on initial evaluation today. Patient reports that the initial evalutation really got him motivated so he has been trying to do a little bit more at the gym, including core strengthening. Addendum- patient was taken off of Celebrex after pacemaker, not put on Celebrex.     Pt will benefit from skilled therapeutic intervention in order to improve on the following deficits Abnormal gait;Decreased coordination;Decreased range of motion;Difficulty walking;Impaired flexibility;Postural dysfunction;Decreased activity tolerance;Decreased balance;Pain;Decreased mobility;Decreased strength   Rehab Potential Good   PT Frequency 2x / week   PT Duration 6 weeks   PT Treatment/Interventions ADLs/Self Care Home Management;Gait training;Neuromuscular re-education;Stair training;Functional mobility training;Patient/family education;Cryotherapy;Therapeutic activities;Therapeutic exercise;Manual techniques;Balance training   PT Next Visit Plan Continue functional stretching and strengthening; gait and stair training    PT Home Exercise Plan given    Consulted and Agree with Plan of Care Patient        Problem List Patient Active  Problem List   Diagnosis Date Noted  . S/P implantation of automatic cardioverter/defibrillator (AICD) 07/26/2014  . Central venous catheter in place   . Hypertension 06/11/2014  . CKD (chronic kidney disease) stage 2, GFR 60-89 ml/min 06/11/2014  . DM type 2 causing renal disease 06/11/2014  . BPH (benign prostatic hypertrophy) 06/11/2014  . Obesity, morbid 06/11/2014  . Essential hypertension   . Congestive dilated cardiomyopathy 06/08/2014  . Cardiac arrest   . Acute respiratory failure with hypoxemia   . PEA (Pulseless electrical activity) 06/07/2014  . Anoxic encephalopathy   . BPH (benign prostatic hyperplasia) 01/24/2014  . Degenerative arthritis of left knee 01/19/2014  . Arthritis of left knee 01/19/2014  . Unspecified constipation 03/31/2013  . Difficulty in walking(719.7) 03/12/2013  . Stiffness of right knee 03/12/2013  . Right leg weakness 03/12/2013  . Osteoarthritis of right knee 02/06/2013  . LBBB (left bundle branch block) 01/07/2013  . Preoperative cardiovascular examination 01/07/2013  . DM2 (diabetes mellitus, type 2) 01/07/2013  . Hypothyroidism 01/07/2013  . Morbid obesity 01/07/2013  . HTN (hypertension) 01/07/2013  . Dyslipidemia 01/07/2013   Deniece Ree PT, DPT [phone]  [place] [address] [place], [place], [address] Phone: [phone]   Fax:  [phone]

## 2014-08-04 NOTE — Telephone Encounter (Signed)
Letter composed.   Phone: [phone] Office opened M-T til 5pm, closed for lunch 1-2

## 2014-08-04 NOTE — Telephone Encounter (Signed)
Letter faxed to [phone]

## 2014-08-06 ENCOUNTER — Ambulatory Visit (HOSPITAL_COMMUNITY): Payer: Medicare Other | Admitting: Physical Therapy

## 2014-08-06 DIAGNOSIS — M6281 Muscle weakness (generalized): Secondary | ICD-10-CM

## 2014-08-06 DIAGNOSIS — R29898 Other symptoms and signs involving the musculoskeletal system: Secondary | ICD-10-CM

## 2014-08-06 DIAGNOSIS — R269 Unspecified abnormalities of gait and mobility: Secondary | ICD-10-CM

## 2014-08-06 DIAGNOSIS — R6889 Other general symptoms and signs: Secondary | ICD-10-CM

## 2014-08-06 NOTE — Therapy (Signed)
[place], [place], [address] Phone: [phone]   Fax:  [phone]  Physical Therapy Treatment  Patient Details  Name: Rickey Rice MRN: [id number] Date of Birth: 16-Oct-1940 Referring Provider:  [name], MD  Encounter Date: 08/06/2014      PT End of Session - 08/06/14 0929    Visit Number 3   Number of Visits 12   Date for PT Re-Evaluation 08/26/14   Authorization Time Period 07/29/14 to 09/28/14   Authorization - Visit Number 3   Authorization - Number of Visits 10   PT Start Time 0850   PT Stop Time 0930   PT Time Calculation (min) 40 min      Past Medical History  Diagnosis Date  . Diabetes mellitus without complication     type 2  . Hypertension   . Hypothyroidism   . Morbid obesity   . Dyslipidemia   . LBBB (left bundle branch block)   . History of nuclear stress test 04/2012    lexiscan - 2 day protocol; low risk study, evidence of inferrior & apical scar but no ischemia   . Arthritis   . Shortness of breath     with exertion   . Prostate enlargement   . Cardiac arrest 05/2014    Primary VF arrest with successful resuscitation and s/p ICD implant  . ICD (implantable cardioverter-defibrillator) in place 05/2014    Dr. [name]  . NICM (nonischemic cardiomyopathy)     Past Surgical History  Procedure Laterality Date  . Mouth surgery  1963  . Finger surgery  1954  . Colonoscopy w/ biopsies    . Knee arthroscopy Left   . Total knee arthroplasty Right 02/05/2013    Dr Mayer Camel  . Total knee arthroplasty Right 02/05/2013    Procedure: TOTAL KNEE ARTHROPLASTY;  Surgeon: Kerin Salen, MD;  Location: [facility];  Service: Orthopedics;  Laterality: Right;  . Total knee arthroplasty Left 01/09/2014    DR Mayer Camel  . Total knee arthroplasty Left 01/19/2014    Procedure: LEFT TOTAL KNEE ARTHROPLASTY;  Surgeon: Kerin Salen, MD;  Location: [facility];  Service: Orthopedics;  Laterality: Left;  . Left  heart catheterization with coronary angiogram N/A 06/12/2014    Procedure: LEFT HEART CATHETERIZATION WITH CORONARY ANGIOGRAM;  Surgeon: Leonie Man, MD;  Location: [facility] CATH LAB;  Service: Cardiovascular;  Laterality: N/A;  . Bi-ventricular implantable cardioverter defibrillator N/A 06/18/2014    Procedure: BI-VENTRICULAR IMPLANTABLE CARDIOVERTER DEFIBRILLATOR  (CRT-D);  Surgeon: Deboraha Sprang, MD;  Location: [facility] CATH LAB;  Service: Cardiovascular;  Laterality: N/A;    There were no vitals filed for this visit.  Visit Diagnosis:  Weakness of left lower extremity  Proximal muscle weakness  Abnormality of gait  Decreased functional activity tolerance      Subjective Assessment - 08/06/14 0855    Subjective Pt states he's moving slow today,(pt 5 minutes late).  Pt states he took 1000 mg of tylenol this morning due to pain states it work him up this morning.    Currently in Pain? Yes   Pain Score 2    Pain Location Knee   Pain Orientation Left   Pain Onset More than a month ago   Pain Frequency Intermittent                 [facility] Adult PT Treatment/Exercise - 08/06/14 0001    Knee/Hip Exercises: Stretches   Active Hamstring Stretch 3  reps;30 seconds   Active Hamstring Stretch Limitations 12 inch box    Hip Flexor Stretch 2 reps;30 seconds   Hip Flexor Stretch Limitations 8" step    Gastroc Stretch 3 reps;30 seconds   Gastroc Stretch Limitations slantboard    Knee/Hip Exercises: Standing   Heel Raises 1 set;15 reps   Forward Lunges Left;1 set;10 reps   Forward Lunges Limitations 8 inch step    Side Lunges Both;1 set;10 reps   Side Lunges Limitations 8 inch box    Lateral Step Up Left;15 reps   Forward Step Up Left;1 set;5 reps   Forward Step Up Limitations 4 inch step    Step Down --   Step Down Limitations --   Functional Squat 1 set;10 reps   Functional Squat Limitations toes neutral, cues for form    Stairs 2 RT    Rocker Board 2 minutes   SLS 5" max x 5 with  min assist    Knee/Hip Exercises: Seated   Other Seated Knee Exercises Sit to stand x 5    Manual Therapy   Manual Therapy Edema management   Edema Management retromassage to decrease swelling                 PT Education - 08/06/14 0929    Education provided Yes   Education Details to go up and down steps intentionally slow to improve strength   Person(s) Educated Patient   Methods Explanation;Demonstration   Comprehension Verbalized understanding;Returned demonstration          PT Short Term Goals - 07/29/14 1200    PT SHORT TERM GOAL #1   Title Patient will demonstrate at least 4/5 to 4+/5 strength in bilateral lower extremities and at least 4-/5 strength in bilateral proximal hip musculature    Time 3   Period Weeks   Status New   PT SHORT TERM GOAL #2   Title Patient will demonstrate 0 degrees L knee extension and at least 125 degrees L knee flexion    Time 3   Period Weeks   Status New   PT SHORT TERM GOAL #3   Title Patient will be able to verbalize the importance of improved posture and will consistently demonstrate improved posture during all functional tasks and activities    Time 3   Period Weeks   Status New   PT SHORT TERM GOAL #4   Title Patient will demonstrate at least 40 degrees bilateral hip ER and at least 37 degrees bilateral hip IR    Time 3   Period Weeks   Status New           PT Long Term Goals - 07/29/14 1202    PT LONG TERM GOAL #1   Title Patient will be independent in correctly and consistently performing appropriate HEP    Time 6   Period Weeks   Status New   PT LONG TERM GOAL #2   Title Patient will be able to ascend/descend full flight of stairs with no railings, good mechanics with no circumduction or hip hiking, and good mechanics and control with stair descent    Time 6   Period Weeks   Status New   PT LONG TERM GOAL #3   Title Patient will be able to participate in functional standing activities for at least 2-3  hours with no pain or feelings of weakness/fatigue in L lower extremity    Time 6   Period Weeks   Status New  PT LONG TERM GOAL #4   Title Patient will be unlimited in ambulation distances over even and uneven surfaces without a device and will not experience weakness or fatigue in knee during gait periods    Time 6   Period Weeks   Status New               Plan - 08/06/14 0930    Clinical Impression Statement Added manual to treatment secondary to increased pain in Lt knee ,(pain woke pt up at 4:00 this morning.   Pt states pain was gone after manual.  Pt continues to need tactile and verbal cuing to ensure proper technique of exercises as well as needing a rest break.     PT Next Visit Plan continue with manual until pt pain has decreased; add step down back into program for eccentric control.        Problem List Patient Active Problem List   Diagnosis Date Noted  . S/P implantation of automatic cardioverter/defibrillator (AICD) 07/26/2014  . Central venous catheter in place   . Hypertension 06/11/2014  . CKD (chronic kidney disease) stage 2, GFR 60-89 ml/min 06/11/2014  . DM type 2 causing renal disease 06/11/2014  . BPH (benign prostatic hypertrophy) 06/11/2014  . Obesity, morbid 06/11/2014  . Essential hypertension   . Congestive dilated cardiomyopathy 06/08/2014  . Cardiac arrest   . Acute respiratory failure with hypoxemia   . PEA (Pulseless electrical activity) 06/07/2014  . Anoxic encephalopathy   . BPH (benign prostatic hyperplasia) 01/24/2014  . Degenerative arthritis of left knee 01/19/2014  . Arthritis of left knee 01/19/2014  . Unspecified constipation 03/31/2013  . Difficulty in walking(719.7) 03/12/2013  . Stiffness of right knee 03/12/2013  . Right leg weakness 03/12/2013  . Osteoarthritis of right knee 02/06/2013  . LBBB (left bundle branch block) 01/07/2013  . Preoperative cardiovascular examination 01/07/2013  . DM2 (diabetes mellitus, type  2) 01/07/2013  . Hypothyroidism 01/07/2013  . Morbid obesity 01/07/2013  . HTN (hypertension) 01/07/2013  . Dyslipidemia 01/07/2013   Rayetta Humphrey, PT CLT [phone] 08/06/2014, 9:33 AM  [place], [place], [address] Phone: [phone]   Fax:  [phone]

## 2014-08-12 ENCOUNTER — Ambulatory Visit (HOSPITAL_COMMUNITY): Payer: Medicare Other | Admitting: Physical Therapy

## 2014-08-12 DIAGNOSIS — R293 Abnormal posture: Secondary | ICD-10-CM

## 2014-08-12 DIAGNOSIS — M6281 Muscle weakness (generalized): Secondary | ICD-10-CM | POA: Diagnosis not present

## 2014-08-12 DIAGNOSIS — R269 Unspecified abnormalities of gait and mobility: Secondary | ICD-10-CM

## 2014-08-12 DIAGNOSIS — R6889 Other general symptoms and signs: Secondary | ICD-10-CM

## 2014-08-12 DIAGNOSIS — R29898 Other symptoms and signs involving the musculoskeletal system: Secondary | ICD-10-CM

## 2014-08-12 NOTE — Therapy (Signed)
[place], [place], [address] Phone: [phone]   Fax:  [phone]  Physical Therapy Treatment  Patient Details  Name: Rickey Rice MRN: [id number] Date of Birth: 1940/08/28 Referring Provider:  [name], MD  Encounter Date: 08/12/2014      PT End of Session - 08/12/14 1220    Visit Number 4   Number of Visits 12   Date for PT Re-Evaluation 08/26/14   Authorization Type [employer] Medicare    Authorization Time Period 07/29/14 to 09/28/14   Authorization - Visit Number 4   Authorization - Number of Visits 10   Activity Tolerance Patient tolerated treatment well   Behavior During Therapy [facility] for tasks assessed/performed      Past Medical History  Diagnosis Date  . Diabetes mellitus without complication     type 2  . Hypertension   . Hypothyroidism   . Morbid obesity   . Dyslipidemia   . LBBB (left bundle branch block)   . History of nuclear stress test 04/2012    lexiscan - 2 day protocol; low risk study, evidence of inferrior & apical scar but no ischemia   . Arthritis   . Shortness of breath     with exertion   . Prostate enlargement   . Cardiac arrest 05/2014    Primary VF arrest with successful resuscitation and s/p ICD implant  . ICD (implantable cardioverter-defibrillator) in place 05/2014    Dr. [name]  . NICM (nonischemic cardiomyopathy)     Past Surgical History  Procedure Laterality Date  . Mouth surgery  1963  . Finger surgery  1954  . Colonoscopy w/ biopsies    . Knee arthroscopy Left   . Total knee arthroplasty Right 02/05/2013    Dr Mayer Camel  . Total knee arthroplasty Right 02/05/2013    Procedure: TOTAL KNEE ARTHROPLASTY;  Surgeon: Kerin Salen, MD;  Location: [place];  Service: Orthopedics;  Laterality: Right;  . Total knee arthroplasty Left 01/09/2014    DR Mayer Camel  . Total knee arthroplasty Left 01/19/2014    Procedure: LEFT TOTAL KNEE ARTHROPLASTY;  Surgeon: Kerin Salen, MD;   Location: [place];  Service: Orthopedics;  Laterality: Left;  . Left heart catheterization with coronary angiogram N/A 06/12/2014    Procedure: LEFT HEART CATHETERIZATION WITH CORONARY ANGIOGRAM;  Surgeon: Leonie Man, MD;  Location: [facility] CATH LAB;  Service: Cardiovascular;  Laterality: N/A;  . Bi-ventricular implantable cardioverter defibrillator N/A 06/18/2014    Procedure: BI-VENTRICULAR IMPLANTABLE CARDIOVERTER DEFIBRILLATOR  (CRT-D);  Surgeon: Deboraha Sprang, MD;  Location: [facility] CATH LAB;  Service: Cardiovascular;  Laterality: N/A;    There were no vitals filed for this visit.  Visit Diagnosis:  Weakness of left lower extremity  Proximal muscle weakness  Abnormality of gait  Decreased functional activity tolerance  Poor posture      Subjective Assessment - 08/12/14 1048    Subjective Patient states he is feeling better, continues to have some intermittent pain in his knee but states its not there all the time    Pertinent History Patient had his left knee replaced in October 2015, but the knee still just feels weak and he continues to feel like he's a little unstable moving around. After patient had pacemaker put in he was taken off Celebrex, and as such as been experiencing increased pain in his knee.    Currently in Pain? Yes   Pain Score 3    Pain Location  Knee                         [facility] Adult PT Treatment/Exercise - 08/12/14 0001    Knee/Hip Exercises: Stretches   Active Hamstring Stretch 3 reps;30 seconds   Active Hamstring Stretch Limitations 14 inch box    Hip Flexor Stretch 2 reps;30 seconds   Hip Flexor Stretch Limitations 2nd step    Gastroc Stretch 3 reps;30 seconds   Gastroc Stretch Limitations slantboard    Knee/Hip Exercises: Standing   Heel Raises 1 set;20 reps   Heel Raises Limitations floor    Forward Lunges Both;1 set;10 reps   Forward Lunges Limitations 7 inch step    Side Lunges Both;1 set;10 reps   Side Lunges Limitations 7 inch  step    Forward Step Up Both;1 set;10 reps   Forward Step Up Limitations 4 inch step    Step Down Both;1 set;10 reps   Step Down Limitations 4 inch step    Rocker Board Limitations x20 AP, x20 lateral U HHA    Other Standing Knee Exercises 3D hip excursions 1x10 neutral stance    Other Standing Knee Exercises Sidestepping 2x12ft   Manual Therapy   Manual Therapy Edema management   Edema Management retromassage to L knee                 PT Education - 08/12/14 1215    Education provided No          PT Short Term Goals - 07/29/14 1200    PT SHORT TERM GOAL #1   Title Patient will demonstrate at least 4/5 to 4+/5 strength in bilateral lower extremities and at least 4-/5 strength in bilateral proximal hip musculature    Time 3   Period Weeks   Status New   PT SHORT TERM GOAL #2   Title Patient will demonstrate 0 degrees L knee extension and at least 125 degrees L knee flexion    Time 3   Period Weeks   Status New   PT SHORT TERM GOAL #3   Title Patient will be able to verbalize the importance of improved posture and will consistently demonstrate improved posture during all functional tasks and activities    Time 3   Period Weeks   Status New   PT SHORT TERM GOAL #4   Title Patient will demonstrate at least 40 degrees bilateral hip ER and at least 37 degrees bilateral hip IR    Time 3   Period Weeks   Status New           PT Long Term Goals - 07/29/14 1202    PT LONG TERM GOAL #1   Title Patient will be independent in correctly and consistently performing appropriate HEP    Time 6   Period Weeks   Status New   PT LONG TERM GOAL #2   Title Patient will be able to ascend/descend full flight of stairs with no railings, good mechanics with no circumduction or hip hiking, and good mechanics and control with stair descent    Time 6   Period Weeks   Status New   PT LONG TERM GOAL #3   Title Patient will be able to participate in functional standing activities  for at least 2-3 hours with no pain or feelings of weakness/fatigue in L lower extremity    Time 6   Period Weeks   Status New   PT LONG TERM GOAL #  4   Title Patient will be unlimited in ambulation distances over even and uneven surfaces without a device and will not experience weakness or fatigue in knee during gait periods    Time 6   Period Weeks   Status New               Plan - 08/12/14 1220    Clinical Impression Statement Continued with functional stretches and exercises as well as manual therapy to L knee; patient states that his pain is intermittent and around 3/10 when it does occur. One rest break this session. Performed manual therapy after exercises today with reduction of pain to 0/10.   Pt will benefit from skilled therapeutic intervention in order to improve on the following deficits Abnormal gait;Decreased coordination;Decreased range of motion;Difficulty walking;Impaired flexibility;Postural dysfunction;Decreased activity tolerance;Decreased balance;Pain;Decreased mobility;Decreased strength   Rehab Potential Good   PT Frequency 2x / week   PT Duration 6 weeks   PT Treatment/Interventions ADLs/Self Care Home Management;Gait training;Neuromuscular re-education;Stair training;Functional mobility training;Patient/family education;Cryotherapy;Therapeutic activities;Therapeutic exercise;Manual techniques;Balance training   PT Next Visit Plan continue with manual until pt pain has decreased; continue functional stretching and strengthening    PT Home Exercise Plan given    Consulted and Agree with Plan of Care Patient        Problem List Patient Active Problem List   Diagnosis Date Noted  . S/P implantation of automatic cardioverter/defibrillator (AICD) 07/26/2014  . Central venous catheter in place   . Hypertension 06/11/2014  . CKD (chronic kidney disease) stage 2, GFR 60-89 ml/min 06/11/2014  . DM type 2 causing renal disease 06/11/2014  . BPH (benign  prostatic hypertrophy) 06/11/2014  . Obesity, morbid 06/11/2014  . Essential hypertension   . Congestive dilated cardiomyopathy 06/08/2014  . Cardiac arrest   . Acute respiratory failure with hypoxemia   . PEA (Pulseless electrical activity) 06/07/2014  . Anoxic encephalopathy   . BPH (benign prostatic hyperplasia) 01/24/2014  . Degenerative arthritis of left knee 01/19/2014  . Arthritis of left knee 01/19/2014  . Unspecified constipation 03/31/2013  . Difficulty in walking(719.7) 03/12/2013  . Stiffness of right knee 03/12/2013  . Right leg weakness 03/12/2013  . Osteoarthritis of right knee 02/06/2013  . LBBB (left bundle branch block) 01/07/2013  . Preoperative cardiovascular examination 01/07/2013  . DM2 (diabetes mellitus, type 2) 01/07/2013  . Hypothyroidism 01/07/2013  . Morbid obesity 01/07/2013  . HTN (hypertension) 01/07/2013  . Dyslipidemia 01/07/2013   Deniece Ree PT, DPT [phone]  [place] [address] [place], [place], [address] Phone: [phone]   Fax:  [phone]

## 2014-08-14 ENCOUNTER — Ambulatory Visit (HOSPITAL_COMMUNITY): Payer: Medicare Other

## 2014-08-14 DIAGNOSIS — M6281 Muscle weakness (generalized): Secondary | ICD-10-CM

## 2014-08-14 DIAGNOSIS — R269 Unspecified abnormalities of gait and mobility: Secondary | ICD-10-CM

## 2014-08-14 DIAGNOSIS — M25661 Stiffness of right knee, not elsewhere classified: Secondary | ICD-10-CM

## 2014-08-14 DIAGNOSIS — R29898 Other symptoms and signs involving the musculoskeletal system: Secondary | ICD-10-CM

## 2014-08-14 DIAGNOSIS — R293 Abnormal posture: Secondary | ICD-10-CM

## 2014-08-14 DIAGNOSIS — R6889 Other general symptoms and signs: Secondary | ICD-10-CM

## 2014-08-14 NOTE — Therapy (Signed)
[place], [place], [address] Phone: [phone]   Fax:  [phone]  Physical Therapy Treatment  Patient Details  Name: Rickey Rice MRN: [id number] Date of Birth: [birth year] Referring Provider:  [name], MD  Encounter Date: 08/14/2014      PT End of Session - 08/14/14 0943    Visit Number 5   Number of Visits 12   Date for PT Re-Evaluation 08/26/14   Authorization Type UHC Medicare    Authorization Time Period 07/29/14 to 09/28/14   Authorization - Visit Number 5   Authorization - Number of Visits 10   PT Start Time 0928   PT Stop Time 1021   PT Time Calculation (min) 53 min   Activity Tolerance Patient tolerated treatment well   Behavior During Therapy [facility] for tasks assessed/performed      Past Medical History  Diagnosis Date  . Diabetes mellitus without complication     type 2  . Hypertension   . Hypothyroidism   . Morbid obesity   . Dyslipidemia   . LBBB (left bundle branch block)   . History of nuclear stress test 04/2012    lexiscan - 2 day protocol; low risk study, evidence of inferrior & apical scar but no ischemia   . Arthritis   . Shortness of breath     with exertion   . Prostate enlargement   . Cardiac arrest 05/2014    Primary VF arrest with successful resuscitation and s/p ICD implant  . ICD (implantable cardioverter-defibrillator) in place 05/2014    Dr. [name]  . NICM (nonischemic cardiomyopathy)     Past Surgical History  Procedure Laterality Date  . Mouth surgery  1963  . Finger surgery  1954  . Colonoscopy w/ biopsies    . Knee arthroscopy Left   . Total knee arthroplasty Right 02/05/2013    Dr Mayer Camel  . Total knee arthroplasty Right 02/05/2013    Procedure: TOTAL KNEE ARTHROPLASTY;  Surgeon: Kerin Salen, MD;  Location: [place];  Service: Orthopedics;  Laterality: Right;  . Total knee arthroplasty Left 01/09/2014    DR Mayer Camel  . Total knee arthroplasty Left  01/19/2014    Procedure: LEFT TOTAL KNEE ARTHROPLASTY;  Surgeon: Kerin Salen, MD;  Location: [place];  Service: Orthopedics;  Laterality: Left;  . Left heart catheterization with coronary angiogram N/A 06/12/2014    Procedure: LEFT HEART CATHETERIZATION WITH CORONARY ANGIOGRAM;  Surgeon: Leonie Man, MD;  Location: [facility] CATH LAB;  Service: Cardiovascular;  Laterality: N/A;  . Bi-ventricular implantable cardioverter defibrillator N/A 06/18/2014    Procedure: BI-VENTRICULAR IMPLANTABLE CARDIOVERTER DEFIBRILLATOR  (CRT-D);  Surgeon: Deboraha Sprang, MD;  Location: [facility] CATH LAB;  Service: Cardiovascular;  Laterality: N/A;    There were no vitals filed for this visit.  Visit Diagnosis:  Weakness of left lower extremity  Proximal muscle weakness  Abnormality of gait  Decreased functional activity tolerance  Poor posture  Stiffness of right knee  Right leg weakness      Subjective Assessment - 08/14/14 0933    Subjective Pt entered dept stateing he has discomfort in Lt ankle, feels his compression socks are too tight, knee feels good today   Currently in Pain? Yes   Pain Score 3    Pain Location Ankle   Pain Orientation Left   Pain Descriptors / Indicators Sore               [facility] Adult  PT Treatment/Exercise - 08/14/14 0001    Exercises   Exercises Knee/Hip   Knee/Hip Exercises: Stretches   Active Hamstring Stretch 3 reps;30 seconds   Active Hamstring Stretch Limitations 14 inch box    Piriformis Stretch 3 reps;30 seconds   Piriformis Stretch Limitations supine figure 4 with towel   Gastroc Stretch 3 reps;30 seconds   Gastroc Stretch Limitations slantboard    Knee/Hip Exercises: Standing   Lateral Step Up Left;10 reps;Hand Hold: 1;Step Height: 6"   Forward Step Up Left;10 reps;Hand Hold: 1;Step Height: 6"   Step Down 10 reps;Both;Hand Hold: 1;Step Height: 4"   Functional Squat 2 sets;10 reps   Functional Squat Limitations squat then IR around 4in step   Other Standing  Knee Exercises 3D hip excursions 1x10 split stance    Other Standing Knee Exercises Sidestepping 2x44ft   Manual Therapy   Manual Therapy Edema management   Edema Management retromassage to L knee               PT Short Term Goals - 08/14/14 5003    PT SHORT TERM GOAL #1   Title Patient will demonstrate at least 4/5 to 4+/5 strength in bilateral lower extremities and at least 4-/5 strength in bilateral proximal hip musculature    Status On-going   PT SHORT TERM GOAL #2   Title Patient will demonstrate 0 degrees L knee extension and at least 125 degrees L knee flexion    Status On-going   PT SHORT TERM GOAL #3   Title Patient will be able to verbalize the importance of improved posture and will consistently demonstrate improved posture during all functional tasks and activities    Status On-going   PT SHORT TERM GOAL #4   Title Patient will demonstrate at least 40 degrees bilateral hip ER and at least 37 degrees bilateral hip IR    Status On-going           PT Long Term Goals - 08/14/14 7048    PT LONG TERM GOAL #1   Title Patient will be independent in correctly and consistently performing appropriate HEP    PT LONG TERM GOAL #2   Title Patient will be able to ascend/descend full flight of stairs with no railings, good mechanics with no circumduction or hip hiking, and good mechanics and control with stair descent    PT LONG TERM GOAL #3   Title Patient will be able to participate in functional standing activities for at least 2-3 hours with no pain or feelings of weakness/fatigue in L lower extremity    PT LONG TERM GOAL #4   Title Patient will be unlimited in ambulation distances over even and uneven surfaces without a device and will not experience weakness or fatigue in knee during gait periods                Plan - 08/14/14 0946    Clinical Impression Statement Began session with manual retro massage to decrease edema Lt ankle and knee for pain control.   Noted pt with increased difficulty putting on socks and shoes, added IR exercises and resumed piriformis stretches to improve hip mobility.  Continued functional strengthening exercises and stretches to improve ROM, pt able to demonstrate appropriate technique following cues for technique.  Increased step height with forward and lateral step ups, unable to increase with step down due to weak eccentric quad control.  No reports of pain at end of session   PT Next Visit Plan continue  with manual until pt pain has decreased; continue functional stretching and strengthening         Problem List Patient Active Problem List   Diagnosis Date Noted  . S/P implantation of automatic cardioverter/defibrillator (AICD) 07/26/2014  . Central venous catheter in place   . Hypertension 06/11/2014  . CKD (chronic kidney disease) stage 2, GFR 60-89 ml/min 06/11/2014  . DM type 2 causing renal disease 06/11/2014  . BPH (benign prostatic hypertrophy) 06/11/2014  . Obesity, morbid 06/11/2014  . Essential hypertension   . Congestive dilated cardiomyopathy 06/08/2014  . Cardiac arrest   . Acute respiratory failure with hypoxemia   . PEA (Pulseless electrical activity) 06/07/2014  . Anoxic encephalopathy   . BPH (benign prostatic hyperplasia) 01/24/2014  . Degenerative arthritis of left knee 01/19/2014  . Arthritis of left knee 01/19/2014  . Unspecified constipation 03/31/2013  . Difficulty in walking(719.7) 03/12/2013  . Stiffness of right knee 03/12/2013  . Right leg weakness 03/12/2013  . Osteoarthritis of right knee 02/06/2013  . LBBB (left bundle branch block) 01/07/2013  . Preoperative cardiovascular examination 01/07/2013  . DM2 (diabetes mellitus, type 2) 01/07/2013  . Hypothyroidism 01/07/2013  . Morbid obesity 01/07/2013  . HTN (hypertension) 01/07/2013  . Dyslipidemia 01/07/2013   Ihor Austin, [place]; [place] [id number] [phone]  Aldona Lento 08/14/2014, 12:14 PM  [place] [address] [place], [place], [address] Phone: [phone]   Fax:  [phone]

## 2014-08-19 ENCOUNTER — Ambulatory Visit (HOSPITAL_COMMUNITY): Payer: Medicare Other | Admitting: Physical Therapy

## 2014-08-19 DIAGNOSIS — M6281 Muscle weakness (generalized): Secondary | ICD-10-CM

## 2014-08-19 DIAGNOSIS — M25661 Stiffness of right knee, not elsewhere classified: Secondary | ICD-10-CM

## 2014-08-19 DIAGNOSIS — R293 Abnormal posture: Secondary | ICD-10-CM

## 2014-08-19 DIAGNOSIS — R269 Unspecified abnormalities of gait and mobility: Secondary | ICD-10-CM

## 2014-08-19 DIAGNOSIS — R6889 Other general symptoms and signs: Secondary | ICD-10-CM

## 2014-08-19 DIAGNOSIS — R29898 Other symptoms and signs involving the musculoskeletal system: Secondary | ICD-10-CM

## 2014-08-19 NOTE — Therapy (Signed)
[place], [place], [address] Phone: [phone]   Fax:  [phone]  Physical Therapy Treatment  Patient Details  Name: Rickey Rice MRN: [id number] Date of Birth: [birth year] Referring Provider:  [name], MD  Encounter Date: 08/19/2014      PT End of Session - 08/19/14 1615    Visit Number 6   Number of Visits 12   Date for PT Re-Evaluation 08/26/14   Authorization Type UHC Medicare    Authorization Time Period 07/29/14 to 09/28/14   Authorization - Visit Number 6   Authorization - Number of Visits 10   PT Start Time 2683   PT Stop Time 1520   PT Time Calculation (min) 45 min   Activity Tolerance Patient tolerated treatment well   Behavior During Therapy [facility] for tasks assessed/performed      Past Medical History  Diagnosis Date  . Diabetes mellitus without complication     type 2  . Hypertension   . Hypothyroidism   . Morbid obesity   . Dyslipidemia   . LBBB (left bundle branch block)   . History of nuclear stress test 04/2012    lexiscan - 2 day protocol; low risk study, evidence of inferrior & apical scar but no ischemia   . Arthritis   . Shortness of breath     with exertion   . Prostate enlargement   . Cardiac arrest 05/2014    Primary VF arrest with successful resuscitation and s/p ICD implant  . ICD (implantable cardioverter-defibrillator) in place 05/2014    Dr. [name]  . NICM (nonischemic cardiomyopathy)     Past Surgical History  Procedure Laterality Date  . Mouth surgery  1963  . Finger surgery  1954  . Colonoscopy w/ biopsies    . Knee arthroscopy Left   . Total knee arthroplasty Right 02/05/2013    Dr Mayer Camel  . Total knee arthroplasty Right 02/05/2013    Procedure: TOTAL KNEE ARTHROPLASTY;  Surgeon: Kerin Salen, MD;  Location: [place];  Service: Orthopedics;  Laterality: Right;  . Total knee arthroplasty Left 01/09/2014    DR Mayer Camel  . Total knee arthroplasty Left  01/19/2014    Procedure: LEFT TOTAL KNEE ARTHROPLASTY;  Surgeon: Kerin Salen, MD;  Location: [place];  Service: Orthopedics;  Laterality: Left;  . Left heart catheterization with coronary angiogram N/A 06/12/2014    Procedure: LEFT HEART CATHETERIZATION WITH CORONARY ANGIOGRAM;  Surgeon: Leonie Man, MD;  Location: [facility] CATH LAB;  Service: Cardiovascular;  Laterality: N/A;  . Bi-ventricular implantable cardioverter defibrillator N/A 06/18/2014    Procedure: BI-VENTRICULAR IMPLANTABLE CARDIOVERTER DEFIBRILLATOR  (CRT-D);  Surgeon: Deboraha Sprang, MD;  Location: [facility] CATH LAB;  Service: Cardiovascular;  Laterality: N/A;    There were no vitals filed for this visit.  Visit Diagnosis:  Weakness of left lower extremity  Proximal muscle weakness  Abnormality of gait  Decreased functional activity tolerance  Poor posture  Stiffness of right knee  Right leg weakness      Subjective Assessment - 08/19/14 1613    Subjective Pt states his knee is much better and only has minimal swelling.  Pt requests to complete all exercises today and hold the massage.  currently without pain.   Currently in Pain? No/denies                         [facility] Adult PT Treatment/Exercise - 08/19/14 1511  Knee/Hip Exercises: Stretches   Active Hamstring Stretch 3 reps;30 seconds   Active Hamstring Stretch Limitations 14 inch box    Gastroc Stretch 3 reps;30 seconds   Gastroc Stretch Limitations slantboard    Knee/Hip Exercises: Standing   Forward Lunges Both;10 reps;2 sets   Forward Lunges Limitations 7 inch step    Side Lunges Both;10 reps;2 sets   Side Lunges Limitations 7 inch step    Lateral Step Up Left;10 reps;Hand Hold: 1;Step Height: 6"   Forward Step Up Left;10 reps;Hand Hold: 1;Step Height: 6"   Step Down 10 reps;Both;Hand Hold: 1;Step Height: 4"   Functional Squat 1 set;10 reps   Functional Squat Limitations squat then IR around 4in step   Rocker Board Limitations x20 AP, x20  lateral U HHA    Knee/Hip Exercises: Seated   Other Seated Knee Exercises Sit to stand x 10                   PT Short Term Goals - 08/14/14 7322    PT SHORT TERM GOAL #1   Title Patient will demonstrate at least 4/5 to 4+/5 strength in bilateral lower extremities and at least 4-/5 strength in bilateral proximal hip musculature    Status On-going   PT SHORT TERM GOAL #2   Title Patient will demonstrate 0 degrees L knee extension and at least 125 degrees L knee flexion    Status On-going   PT SHORT TERM GOAL #3   Title Patient will be able to verbalize the importance of improved posture and will consistently demonstrate improved posture during all functional tasks and activities    Status On-going   PT SHORT TERM GOAL #4   Title Patient will demonstrate at least 40 degrees bilateral hip ER and at least 37 degrees bilateral hip IR    Status On-going           PT Long Term Goals - 08/14/14 0953    PT LONG TERM GOAL #1   Title Patient will be independent in correctly and consistently performing appropriate HEP    PT LONG TERM GOAL #2   Title Patient will be able to ascend/descend full flight of stairs with no railings, good mechanics with no circumduction or hip hiking, and good mechanics and control with stair descent    PT LONG TERM GOAL #3   Title Patient will be able to participate in functional standing activities for at least 2-3 hours with no pain or feelings of weakness/fatigue in L lower extremity    PT LONG TERM GOAL #4   Title Patient will be unlimited in ambulation distances over even and uneven surfaces without a device and will not experience weakness or fatigue in knee during gait periods                Plan - 08/19/14 1616    Clinical Impression Statement Held retro massage today and focused on increasing functional strength.  Forward step downs most difficult for patient due to lacking eccentric strength.  Pt is compliant with wearing knee high  compression stockings, however suggested to patient he may want to increase to thigh highs to cover the knee and promote increased compression where it is needed most.    PT Next Visit Plan complete manual PRN and progress functional stretching and strengthening for return to goal level.        Problem List Patient Active Problem List   Diagnosis Date Noted  . S/P implantation of automatic  cardioverter/defibrillator (AICD) 07/26/2014  . Central venous catheter in place   . Hypertension 06/11/2014  . CKD (chronic kidney disease) stage 2, GFR 60-89 ml/min 06/11/2014  . DM type 2 causing renal disease 06/11/2014  . BPH (benign prostatic hypertrophy) 06/11/2014  . Obesity, morbid 06/11/2014  . Essential hypertension   . Congestive dilated cardiomyopathy 06/08/2014  . Cardiac arrest   . Acute respiratory failure with hypoxemia   . PEA (Pulseless electrical activity) 06/07/2014  . Anoxic encephalopathy   . BPH (benign prostatic hyperplasia) 01/24/2014  . Degenerative arthritis of left knee 01/19/2014  . Arthritis of left knee 01/19/2014  . Unspecified constipation 03/31/2013  . Difficulty in walking(719.7) 03/12/2013  . Stiffness of right knee 03/12/2013  . Right leg weakness 03/12/2013  . Osteoarthritis of right knee 02/06/2013  . LBBB (left bundle branch block) 01/07/2013  . Preoperative cardiovascular examination 01/07/2013  . DM2 (diabetes mellitus, type 2) 01/07/2013  . Hypothyroidism 01/07/2013  . Morbid obesity 01/07/2013  . HTN (hypertension) 01/07/2013  . Dyslipidemia 01/07/2013    Teena Irani, PTA/CLT [phone] 08/19/2014, 4:21 PM  [place] [address] [place], [place], [address] Phone: [phone]   Fax:  [phone]

## 2014-08-21 ENCOUNTER — Ambulatory Visit (HOSPITAL_COMMUNITY): Payer: Medicare Other

## 2014-08-21 DIAGNOSIS — M6281 Muscle weakness (generalized): Secondary | ICD-10-CM | POA: Diagnosis not present

## 2014-08-21 DIAGNOSIS — R29898 Other symptoms and signs involving the musculoskeletal system: Secondary | ICD-10-CM

## 2014-08-21 DIAGNOSIS — R293 Abnormal posture: Secondary | ICD-10-CM

## 2014-08-21 DIAGNOSIS — R269 Unspecified abnormalities of gait and mobility: Secondary | ICD-10-CM

## 2014-08-21 DIAGNOSIS — R6889 Other general symptoms and signs: Secondary | ICD-10-CM

## 2014-08-21 DIAGNOSIS — M25661 Stiffness of right knee, not elsewhere classified: Secondary | ICD-10-CM

## 2014-08-21 NOTE — Therapy (Signed)
[place], [place], [address] Phone: [phone]   Fax:  [phone]  Physical Therapy Treatment  Patient Details  Name: Rickey Rice MRN: [id number] Date of Birth: Jan 18, 1941 Referring Provider:  [name], MD  Encounter Date: 08/21/2014      PT End of Session - 08/21/14 0946    Visit Number 7   Number of Visits 12   Date for PT Re-Evaluation 08/26/14   Authorization Type [employer] Medicare    Authorization Time Period 07/29/14 to 09/28/14   Authorization - Visit Number 7   Authorization - Number of Visits 10   PT Start Time 0933   PT Stop Time 1018   PT Time Calculation (min) 45 min   Activity Tolerance Patient tolerated treatment well   Behavior During Therapy [facility] for tasks assessed/performed      Past Medical History  Diagnosis Date  . Diabetes mellitus without complication     type 2  . Hypertension   . Hypothyroidism   . Morbid obesity   . Dyslipidemia   . LBBB (left bundle branch block)   . History of nuclear stress test 04/2012    lexiscan - 2 day protocol; low risk study, evidence of inferrior & apical scar but no ischemia   . Arthritis   . Shortness of breath     with exertion   . Prostate enlargement   . Cardiac arrest 05/2014    Primary VF arrest with successful resuscitation and s/p ICD implant  . ICD (implantable cardioverter-defibrillator) in place 05/2014    Dr. [name]  . NICM (nonischemic cardiomyopathy)     Past Surgical History  Procedure Laterality Date  . Mouth surgery  1963  . Finger surgery  1954  . Colonoscopy w/ biopsies    . Knee arthroscopy Left   . Total knee arthroplasty Right 02/05/2013    Dr Mayer Camel  . Total knee arthroplasty Right 02/05/2013    Procedure: TOTAL KNEE ARTHROPLASTY;  Surgeon: Kerin Salen, MD;  [facility];  Service: Orthopedics;  Laterality: Right;  . Total knee arthroplasty Left 01/09/2014    DR Mayer Camel  . Total knee arthroplasty Left  01/19/2014    Procedure: LEFT TOTAL KNEE ARTHROPLASTY;  Surgeon: Kerin Salen, MD;  Location: [place];  Service: Orthopedics;  Laterality: Left;  . Left heart catheterization with coronary angiogram N/A 06/12/2014    Procedure: LEFT HEART CATHETERIZATION WITH CORONARY ANGIOGRAM;  Surgeon: Leonie Man, MD;  Location: [facility] CATH LAB;  Service: Cardiovascular;  Laterality: N/A;  . Bi-ventricular implantable cardioverter defibrillator N/A 06/18/2014    Procedure: BI-VENTRICULAR IMPLANTABLE CARDIOVERTER DEFIBRILLATOR  (CRT-D);  Surgeon: Deboraha Sprang, MD;  Location: [facility] CATH LAB;  Service: Cardiovascular;  Laterality: N/A;    There were no vitals filed for this visit.  Visit Diagnosis:  Weakness of left lower extremity  Proximal muscle weakness  Abnormality of gait  Decreased functional activity tolerance  Poor posture  Stiffness of right knee  Right leg weakness      Subjective Assessment - 08/21/14 0932    Subjective Pt stated knee is feeling good, pt c/o Lt ankle pain scale 3-4/10   Currently in Pain? Yes   Pain Score 3    Pain Location Ankle   Pain Orientation Left   Pain Descriptors / Indicators Sore             [facility] Adult PT Treatment/Exercise - 08/21/14 0001    Exercises  Exercises Knee/Hip   Knee/Hip Exercises: Stretches   Active Hamstring Stretch 3 reps;30 seconds   Active Hamstring Stretch Limitations 14 inch box    Hip Flexor Stretch 2 reps;30 seconds   Hip Flexor Stretch Limitations 2nd step    Gastroc Stretch 3 reps;30 seconds   Gastroc Stretch Limitations slantboard    Knee/Hip Exercises: Standing   Forward Lunges Both;10 reps;2 sets   Forward Lunges Limitations 6in step   Side Lunges Both;10 reps;2 sets   Side Lunges Limitations 6in step   Lateral Step Up Left;10 reps;Hand Hold: 1;Step Height: 6"   Forward Step Up Left;10 reps;Hand Hold: 1;Step Height: 6"   Step Down 10 reps;Both;Hand Hold: 1;Step Height: 4"   Other Standing Knee Exercises 3D ankle  excursion for Lt ankle; 3D hip excursions 1x10 split stance    Other Standing Knee Exercises 3D hip excursion   Manual Therapy   Manual Therapy Edema management   Edema Management retromassage to L knee              PT Short Term Goals - 08/21/14 3536    PT SHORT TERM GOAL #1   Title Patient will demonstrate at least 4/5 to 4+/5 strength in bilateral lower extremities and at least 4-/5 strength in bilateral proximal hip musculature    Status On-going   PT SHORT TERM GOAL #2   Title Patient will demonstrate 0 degrees L knee extension and at least 125 degrees L knee flexion    Status On-going   PT SHORT TERM GOAL #3   Title Patient will be able to verbalize the importance of improved posture and will consistently demonstrate improved posture during all functional tasks and activities    Status On-going   PT SHORT TERM GOAL #4   Title Patient will demonstrate at least 40 degrees bilateral hip ER and at least 37 degrees bilateral hip IR            PT Long Term Goals - 08/21/14 0953    PT LONG TERM GOAL #1   Title Patient will be independent in correctly and consistently performing appropriate HEP    Status On-going   PT LONG TERM GOAL #2   Title Patient will be able to ascend/descend full flight of stairs with no railings, good mechanics with no circumduction or hip hiking, and good mechanics and control with stair descent    Status On-going   PT LONG TERM GOAL #3   Title Patient will be able to participate in functional standing activities for at least 2-3 hours with no pain or feelings of weakness/fatigue in L lower extremity    PT LONG TERM GOAL #4   Title Patient will be unlimited in ambulation distances over even and uneven surfaces without a device and will not experience weakness or fatigue in knee during gait periods                Plan - 08/21/14 0948    Clinical Impression Statement Began 3D ankle excursion to improve ankle mobility to improve gait mechanics  and reduce pain.  Resumed retro massage for edema control to assist with pain control as well.  Continued discussion with thigh high compression hose, referral sent to MD for Bil 20-30 mmHg.  Pt improving functional strengthening with reports of increase ease with forward stair traing, continues to demonstrate weak eccentric control while descending stairs.  Pt stated pain resolved at end of session following manual.     PT Next Visit Plan  complete manual PRN and progress functional stretching and strengthening for return to goal level.        Problem List Patient Active Problem List   Diagnosis Date Noted  . S/P implantation of automatic cardioverter/defibrillator (AICD) 07/26/2014  . Central venous catheter in place   . Hypertension 06/11/2014  . CKD (chronic kidney disease) stage 2, GFR 60-89 ml/min 06/11/2014  . DM type 2 causing renal disease 06/11/2014  . BPH (benign prostatic hypertrophy) 06/11/2014  . Obesity, morbid 06/11/2014  . Essential hypertension   . Congestive dilated cardiomyopathy 06/08/2014  . Cardiac arrest   . Acute respiratory failure with hypoxemia   . PEA (Pulseless electrical activity) 06/07/2014  . Anoxic encephalopathy   . BPH (benign prostatic hyperplasia) 01/24/2014  . Degenerative arthritis of left knee 01/19/2014  . Arthritis of left knee 01/19/2014  . Unspecified constipation 03/31/2013  . Difficulty in walking(719.7) 03/12/2013  . Stiffness of right knee 03/12/2013  . Right leg weakness 03/12/2013  . Osteoarthritis of right knee 02/06/2013  . LBBB (left bundle branch block) 01/07/2013  . Preoperative cardiovascular examination 01/07/2013  . DM2 (diabetes mellitus, type 2) 01/07/2013  . Hypothyroidism 01/07/2013  . Morbid obesity 01/07/2013  . HTN (hypertension) 01/07/2013  . Dyslipidemia 01/07/2013   Aldona Lento, PTA  Aldona Lento 08/21/2014, 1:14 PM  [place], [place], [address] Phone: [phone]   Fax:  [phone]

## 2014-08-25 ENCOUNTER — Ambulatory Visit (HOSPITAL_COMMUNITY): Payer: Medicare Other | Admitting: Physical Therapy

## 2014-08-25 DIAGNOSIS — R293 Abnormal posture: Secondary | ICD-10-CM

## 2014-08-25 DIAGNOSIS — M6281 Muscle weakness (generalized): Secondary | ICD-10-CM

## 2014-08-25 DIAGNOSIS — M25661 Stiffness of right knee, not elsewhere classified: Secondary | ICD-10-CM

## 2014-08-25 DIAGNOSIS — R6889 Other general symptoms and signs: Secondary | ICD-10-CM

## 2014-08-25 DIAGNOSIS — R269 Unspecified abnormalities of gait and mobility: Secondary | ICD-10-CM

## 2014-08-25 DIAGNOSIS — R29898 Other symptoms and signs involving the musculoskeletal system: Secondary | ICD-10-CM

## 2014-08-25 NOTE — Therapy (Signed)
[place], [place], [address] Phone: [phone]   Fax:  [phone]  Physical Therapy Treatment  Patient Details  Name: Rickey Rice MRN: [id number] Date of Birth: 1940-11-01 Referring Provider:  [name], MD  Encounter Date: 08/25/2014      PT End of Session - 08/25/14 1151    Visit Number 8   Number of Visits 12   Date for PT Re-Evaluation 08/26/14   Authorization Type [employer] Medicare    Authorization Time Period 07/29/14 to 09/28/14   Authorization - Visit Number 8   Authorization - Number of Visits 10   PT Start Time 1100   PT Stop Time 1147   PT Time Calculation (min) 47 min   Activity Tolerance Patient tolerated treatment well   Behavior During Therapy [facility] for tasks assessed/performed      Past Medical History  Diagnosis Date  . Diabetes mellitus without complication     type 2  . Hypertension   . Hypothyroidism   . Morbid obesity   . Dyslipidemia   . LBBB (left bundle branch block)   . History of nuclear stress test 04/2012    lexiscan - 2 day protocol; low risk study, evidence of inferrior & apical scar but no ischemia   . Arthritis   . Shortness of breath     with exertion   . Prostate enlargement   . Cardiac arrest 05/2014    Primary VF arrest with successful resuscitation and s/p ICD implant  . ICD (implantable cardioverter-defibrillator) in place 05/2014    Dr. [name]  . NICM (nonischemic cardiomyopathy)     Past Surgical History  Procedure Laterality Date  . Mouth surgery  1963  . Finger surgery  1954  . Colonoscopy w/ biopsies    . Knee arthroscopy Left   . Total knee arthroplasty Right 02/05/2013    Dr Mayer Camel  . Total knee arthroplasty Right 02/05/2013    Procedure: TOTAL KNEE ARTHROPLASTY;  Surgeon: Kerin Salen, MD;  Location: [place];  Service: Orthopedics;  Laterality: Right;  . Total knee arthroplasty Left 01/09/2014    DR Mayer Camel  . Total knee arthroplasty Left  01/19/2014    Procedure: LEFT TOTAL KNEE ARTHROPLASTY;  Surgeon: Kerin Salen, MD;  Location: [place];  Service: Orthopedics;  Laterality: Left;  . Left heart catheterization with coronary angiogram N/A 06/12/2014    Procedure: LEFT HEART CATHETERIZATION WITH CORONARY ANGIOGRAM;  Surgeon: Leonie Man, MD;  Location: [facility] CATH LAB;  Service: Cardiovascular;  Laterality: N/A;  . Bi-ventricular implantable cardioverter defibrillator N/A 06/18/2014    Procedure: BI-VENTRICULAR IMPLANTABLE CARDIOVERTER DEFIBRILLATOR  (CRT-D);  Surgeon: Deboraha Sprang, MD;  Location: [facility] CATH LAB;  Service: Cardiovascular;  Laterality: N/A;    There were no vitals filed for this visit.  Visit Diagnosis:  Weakness of left lower extremity  Proximal muscle weakness  Abnormality of gait  Poor posture  Decreased functional activity tolerance  Stiffness of right knee  Right leg weakness      Subjective Assessment - 08/25/14 1106    Subjective PT states he had a great birthday party over the weekend.  Currently without pain in Rt knee, 2/10 in Lt knee.   Currently in Pain? Yes   Pain Score 2    Pain Location Knee   Pain Orientation Left  [place] Adult PT Treatment/Exercise - 08/25/14 1108    Knee/Hip Exercises: Stretches   Active Hamstring Stretch 3 reps;30 seconds   Active Hamstring Stretch Limitations 14 inch box    Hip Flexor Stretch 2 reps;30 seconds   Hip Flexor Stretch Limitations 2nd step    Gastroc Stretch 3 reps;30 seconds   Gastroc Stretch Limitations slantboard    Knee/Hip Exercises: Standing   Forward Lunges Both;2 seconds;10 reps   Forward Lunges Limitations 4 inch   Side Lunges Both;10 reps;2 sets   Side Lunges Limitations 4 inch   Lateral Step Up Step Height: 6";Both;15 reps;Hand Hold: 1   Forward Step Up Hand Hold: 1;Step Height: 6";15 reps;Both   Step Down Both;Hand Hold: 1;Step Height: 4";15 reps   Functional Squat 1 set;10 reps   Functional  Squat Limitations squat then IR around 4in step   Other Standing Knee Exercises 3D hip excursion   Knee/Hip Exercises: Seated   Other Seated Knee Exercises Sit to stand x 10    Manual Therapy   Manual Therapy Edema management   Edema Management retromassage to L knee                   PT Short Term Goals - 08/21/14 8185    PT SHORT TERM GOAL #1   Title Patient will demonstrate at least 4/5 to 4+/5 strength in bilateral lower extremities and at least 4-/5 strength in bilateral proximal hip musculature    Status On-going   PT SHORT TERM GOAL #2   Title Patient will demonstrate 0 degrees L knee extension and at least 125 degrees L knee flexion    Status On-going   PT SHORT TERM GOAL #3   Title Patient will be able to verbalize the importance of improved posture and will consistently demonstrate improved posture during all functional tasks and activities    Status On-going   PT SHORT TERM GOAL #4   Title Patient will demonstrate at least 40 degrees bilateral hip ER and at least 37 degrees bilateral hip IR            PT Long Term Goals - 08/21/14 0953    PT LONG TERM GOAL #1   Title Patient will be independent in correctly and consistently performing appropriate HEP    Status On-going   PT LONG TERM GOAL #2   Title Patient will be able to ascend/descend full flight of stairs with no railings, good mechanics with no circumduction or hip hiking, and good mechanics and control with stair descent    Status On-going   PT LONG TERM GOAL #3   Title Patient will be able to participate in functional standing activities for at least 2-3 hours with no pain or feelings of weakness/fatigue in L lower extremity    PT LONG TERM GOAL #4   Title Patient will be unlimited in ambulation distances over even and uneven surfaces without a device and will not experience weakness or fatigue in knee during gait periods                Plan - 08/25/14 1151    Clinical Impression Statement  Pt no longer with ankle pain. Pt also going to [facility] for aerobic exercise.  PT completed exericses without c/o pain but with noted eccentric weakness in Rt>Lt for step downs.  Retro massage completed for Lt knee.  No induration present.   PT Next Visit Plan complete manual PRN and progress functional stretching and strengthening for return to  goal level.        Problem List Patient Active Problem List   Diagnosis Date Noted  . S/P implantation of automatic cardioverter/defibrillator (AICD) 07/26/2014  . Central venous catheter in place   . Hypertension 06/11/2014  . CKD (chronic kidney disease) stage 2, GFR 60-89 ml/min 06/11/2014  . DM type 2 causing renal disease 06/11/2014  . BPH (benign prostatic hypertrophy) 06/11/2014  . Obesity, morbid 06/11/2014  . Essential hypertension   . Congestive dilated cardiomyopathy 06/08/2014  . Cardiac arrest   . Acute respiratory failure with hypoxemia   . PEA (Pulseless electrical activity) 06/07/2014  . Anoxic encephalopathy   . BPH (benign prostatic hyperplasia) 01/24/2014  . Degenerative arthritis of left knee 01/19/2014  . Arthritis of left knee 01/19/2014  . Unspecified constipation 03/31/2013  . Difficulty in walking(719.7) 03/12/2013  . Stiffness of right knee 03/12/2013  . Right leg weakness 03/12/2013  . Osteoarthritis of right knee 02/06/2013  . LBBB (left bundle branch block) 01/07/2013  . Preoperative cardiovascular examination 01/07/2013  . DM2 (diabetes mellitus, type 2) 01/07/2013  . Hypothyroidism 01/07/2013  . Morbid obesity 01/07/2013  . HTN (hypertension) 01/07/2013  . Dyslipidemia 01/07/2013    Teena Irani, PTA/CLT [phone]  08/25/2014, 11:55 AM  [place], [place], [address] Phone: [phone]   Fax:  [phone]

## 2014-08-27 ENCOUNTER — Ambulatory Visit (HOSPITAL_COMMUNITY): Payer: Medicare Other | Attending: Orthopedic Surgery | Admitting: Physical Therapy

## 2014-08-27 DIAGNOSIS — M6281 Muscle weakness (generalized): Secondary | ICD-10-CM

## 2014-08-27 DIAGNOSIS — R29898 Other symptoms and signs involving the musculoskeletal system: Secondary | ICD-10-CM

## 2014-08-27 DIAGNOSIS — R2689 Other abnormalities of gait and mobility: Secondary | ICD-10-CM | POA: Insufficient documentation

## 2014-08-27 DIAGNOSIS — R6889 Other general symptoms and signs: Secondary | ICD-10-CM | POA: Insufficient documentation

## 2014-08-27 DIAGNOSIS — R293 Abnormal posture: Secondary | ICD-10-CM | POA: Insufficient documentation

## 2014-08-27 DIAGNOSIS — Z96652 Presence of left artificial knee joint: Secondary | ICD-10-CM | POA: Diagnosis not present

## 2014-08-27 DIAGNOSIS — M25662 Stiffness of left knee, not elsewhere classified: Secondary | ICD-10-CM | POA: Diagnosis not present

## 2014-08-27 DIAGNOSIS — R269 Unspecified abnormalities of gait and mobility: Secondary | ICD-10-CM

## 2014-08-27 NOTE — Therapy (Signed)
[facility] [address] [place], [place], [address] Phone: [phone]   Fax:  [phone]  Physical Therapy Treatment (Re-Assessment)  Patient Details  Name: Rickey Rice MRN: [id number] Date of Birth: 1940-08-06 Referring Provider:  [name], MD  Encounter Date: 08/27/2014      PT End of Session - 08/27/14 1342    Visit Number 9   Number of Visits 12   Date for PT Re-Evaluation 08/26/14   Authorization Type [employer] Medicare    Authorization Time Period 07/29/14 to 09/28/14; G-code done 9th session   Authorization - Visit Number 9   Authorization - Number of Visits 10      Past Medical History  Diagnosis Date  . Diabetes mellitus without complication     type 2  . Hypertension   . Hypothyroidism   . Morbid obesity   . Dyslipidemia   . LBBB (left bundle branch block)   . History of nuclear stress test 04/2012    lexiscan - 2 day protocol; low risk study, evidence of inferrior & apical scar but no ischemia   . Arthritis   . Shortness of breath     with exertion   . Prostate enlargement   . Cardiac arrest 05/2014    Primary VF arrest with successful resuscitation and s/p ICD implant  . ICD (implantable cardioverter-defibrillator) in place 05/2014    Dr. [name]  . NICM (nonischemic cardiomyopathy)     Past Surgical History  Procedure Laterality Date  . Mouth surgery  1963  . Finger surgery  1954  . Colonoscopy w/ biopsies    . Knee arthroscopy Left   . Total knee arthroplasty Right 02/05/2013    Dr Mayer Camel  . Total knee arthroplasty Right 02/05/2013    Procedure: TOTAL KNEE ARTHROPLASTY;  Surgeon: Kerin Salen, MD;  Location: [facility];  Service: Orthopedics;  Laterality: Right;  . Total knee arthroplasty Left 01/09/2014    DR Mayer Camel  . Total knee arthroplasty Left 01/19/2014    Procedure: LEFT TOTAL KNEE ARTHROPLASTY;  Surgeon: Kerin Salen, MD;  Location: [facility];  Service: Orthopedics;  Laterality: Left;  . Left heart  catheterization with coronary angiogram N/A 06/12/2014    Procedure: LEFT HEART CATHETERIZATION WITH CORONARY ANGIOGRAM;  Surgeon: Leonie Man, MD;  Location: [facility] CATH LAB;  Service: Cardiovascular;  Laterality: N/A;  . Bi-ventricular implantable cardioverter defibrillator N/A 06/18/2014    Procedure: BI-VENTRICULAR IMPLANTABLE CARDIOVERTER DEFIBRILLATOR  (CRT-D);  Surgeon: Deboraha Sprang, MD;  Location: [facility] C[facility] LAB;  Service: Cardiovascular;  Laterality: N/A;    There were no vitals filed for this visit.  Visit Diagnosis:  Weakness of left lower extremity  Proximal muscle weakness  Abnormality of gait  Poor posture  Decreased functional activity tolerance      Subjective Assessment - 08/27/14 1318    Subjective Patient reports he is diong very well today, only mild pain in his knee today but states that his ankle is bothering him somewhat. States he really feels like he has improved a lot.             [facility] PT Assessment - 08/27/14 0001    AROM   Right Hip External Rotation  40   Right Hip Internal Rotation  30   Left Hip External Rotation  35   Left Hip Internal Rotation  30   Left Knee Extension 3   Left Knee Flexion 124   Right Ankle Dorsiflexion 17  Left Ankle Dorsiflexion 18   Strength   Right Hip Flexion 3-/5   Right Hip ABduction 4-/5   Left Hip Flexion 3-/5   Left Hip ABduction 3-/5   Right Knee Flexion 4/5   Right Knee Extension 5/5   Left Knee Flexion 3+/5   Left Knee Extension 4/5   Right Ankle Dorsiflexion 5/5   Left Ankle Dorsiflexion 5/5                     [facility] Adult PT Treatment/Exercise - 08/27/14 0001    Knee/Hip Exercises: Stretches   Active Hamstring Stretch 3 reps;30 seconds   Active Hamstring Stretch Limitations 14 inch box    Hip Flexor Stretch 2 reps;30 seconds   Hip Flexor Stretch Limitations 2nd step    Gastroc Stretch 3 reps;30 seconds   Gastroc Stretch Limitations slantboard    Knee/Hip Exercises: Standing    Forward Lunges Both;1 set;10 reps   Forward Lunges Limitations 4 inch box    Side Lunges Both;1 set;10 reps   Side Lunges Limitations 4 inch box                 PT Education - 08/27/14 1341    Education provided Yes   Education Details progress with skilled PT services, plan of care moving forward    Person(s) Educated Patient   Methods Explanation   Comprehension Verbalized understanding          PT Short Term Goals - 08/27/14 1331    PT SHORT TERM GOAL #1   Title Patient will demonstrate at least 4/5 to 4+/5 strength in bilateral lower extremities and at least 4-/5 strength in bilateral proximal hip musculature    Time 3   Period Weeks   Status On-going   PT SHORT TERM GOAL #2   Title Patient will demonstrate 0 degrees L knee extension and at least 125 degrees L knee flexion    Time 3   Period Weeks   Status On-going   PT SHORT TERM GOAL #3   Title Patient will be able to verbalize the importance of improved posture and will consistently demonstrate improved posture during all functional tasks and activities    Time 3   Period Weeks   Status On-going   PT SHORT TERM GOAL #4   Title Patient will demonstrate at least 40 degrees bilateral hip ER and at least 37 degrees bilateral hip IR    Time 3   Period Weeks   Status On-going           PT Long Term Goals - 08/27/14 1332    PT LONG TERM GOAL #1   Title Patient will be independent in correctly and consistently performing appropriate HEP    Time 6   Period Weeks   Status Achieved   PT LONG TERM GOAL #2   Title Patient will be able to ascend/descend full flight of stairs with no railings, good mechanics with no circumduction or hip hiking, and good mechanics and control with stair descent    Time 6   Period Weeks   Status On-going   PT LONG TERM GOAL #3   Title Patient will be able to participate in functional standing activities for at least 2-3 hours with no pain or feelings of weakness/fatigue in L  lower extremity    Time 6   Period Weeks   Status On-going   PT LONG TERM GOAL #4   Title Patient will be unlimited in  ambulation distances over even and uneven surfaces without a device and will not experience weakness or fatigue in knee during gait periods    Time 6   Period Weeks   Status On-going               Plan - 2014/09/06 1523    Clinical Impression Statement Re-assessment performed today. Patient continues to demonstrate bilateral LE and proximal muscle weakness, postual and gait deviations, reduced core strength, intermittent knee pain, and reduced functional task performance. He states that while he notices he is getting stronger and is moving better but thinks that he still "...has some work to be done". Patient will benefit from skilled PT services for approximately 4 more weeks, at 2x/week, in order to address these deficits and assist her in reaching an optimal level of function.     Pt will benefit from skilled therapeutic intervention in order to improve on the following deficits Abnormal gait;Decreased coordination;Decreased range of motion;Difficulty walking;Impaired flexibility;Postural dysfunction;Decreased activity tolerance;Decreased balance;Pain;Decreased mobility;Decreased strength   Rehab Potential Good   PT Frequency 2x / week   PT Duration 4 weeks   PT Treatment/Interventions ADLs/Self Care Home Management;Gait training;Neuromuscular re-education;Stair training;Functional mobility training;Patient/family education;Cryotherapy;Therapeutic activities;Therapeutic exercise;Manual techniques;Balance training   PT Next Visit Plan complete manual PRN and progress functional stretching and strengthening for return to goal level.   PT Home Exercise Plan given    Consulted and Agree with Plan of Care Patient          G-Codes - 2014-09-06 1525    Functional Assessment Tool Used Based on skilled clinical assessment focusing on general strength, gait abnormalities,  postural deviations   Functional Limitation Mobility: Walking and moving around   Mobility: Walking and Moving Around Current Status (Q5956) At least 20 percent but less than 40 percent impaired, limited or restricted   Mobility: Walking and Moving Around Goal Status [phone]) At least 1 percent but less than 20 percent impaired, limited or restricted      Problem List Patient Active Problem List   Diagnosis Date Noted  . S/P implantation of automatic cardioverter/defibrillator (AICD) 07/26/2014  . Central venous catheter in place   . Hypertension 06/11/2014  . CKD (chronic kidney disease) stage 2, GFR 60-89 ml/min 06/11/2014  . DM type 2 causing renal disease 06/11/2014  . BPH (benign prostatic hypertrophy) 06/11/2014  . Obesity, morbid 06/11/2014  . Essential hypertension   . Congestive dilated cardiomyopathy 06/08/2014  . Cardiac arrest   . Acute respiratory failure with hypoxemia   . PEA (Pulseless electrical activity) 06/07/2014  . Anoxic encephalopathy   . BPH (benign prostatic hyperplasia) 01/24/2014  . Degenerative arthritis of left knee 01/19/2014  . Arthritis of left knee 01/19/2014  . Unspecified constipation 03/31/2013  . Difficulty in walking(719.7) 03/12/2013  . Stiffness of right knee 03/12/2013  . Right leg weakness 03/12/2013  . Osteoarthritis of right knee 02/06/2013  . LBBB (left bundle branch block) 01/07/2013  . Preoperative cardiovascular examination 01/07/2013  . DM2 (diabetes mellitus, type 2) 01/07/2013  . Hypothyroidism 01/07/2013  . Morbid obesity 01/07/2013  . HTN (hypertension) 01/07/2013  . Dyslipidemia 01/07/2013    Deniece Ree PT, DPT [phone]  [facility] [address] [place], [place], [address] Phone: [phone]   Fax:  [phone]

## 2014-09-01 ENCOUNTER — Ambulatory Visit (HOSPITAL_COMMUNITY): Payer: Medicare Other | Admitting: Physical Therapy

## 2014-09-01 DIAGNOSIS — M25661 Stiffness of right knee, not elsewhere classified: Secondary | ICD-10-CM

## 2014-09-01 DIAGNOSIS — M6281 Muscle weakness (generalized): Secondary | ICD-10-CM

## 2014-09-01 DIAGNOSIS — R269 Unspecified abnormalities of gait and mobility: Secondary | ICD-10-CM

## 2014-09-01 DIAGNOSIS — R29898 Other symptoms and signs involving the musculoskeletal system: Secondary | ICD-10-CM

## 2014-09-01 DIAGNOSIS — R6889 Other general symptoms and signs: Secondary | ICD-10-CM

## 2014-09-01 NOTE — Therapy (Signed)
[facility] [place], [place], [address] Phone: [phone]   Fax:  [phone]  Physical Therapy Treatment  Patient Details  Name: Rickey Rice MRN: [id number] Date of Birth: 12-17-1940 Referring Provider:  Lucianne Lei, MD  Encounter Date: 09/01/2014      PT End of Session - 09/01/14 1340    Visit Number 10   Number of Visits 12   Authorization Type [employer] Medicare    Authorization Time Period 07/29/14 to 09/28/14; G-code done 9th session   Authorization - Visit Number 10   Authorization - Number of Visits 10   PT Start Time 1300   PT Stop Time 1342   PT Time Calculation (min) 42 min   Activity Tolerance Patient tolerated treatment well   Behavior During Therapy [facility] for tasks assessed/performed      Past Medical History  Diagnosis Date  . Diabetes mellitus without complication     type 2  . Hypertension   . Hypothyroidism   . Morbid obesity   . Dyslipidemia   . LBBB (left bundle branch block)   . History of nuclear stress test 04/2012    lexiscan - 2 day protocol; low risk study, evidence of inferrior & apical scar but no ischemia   . Arthritis   . Shortness of breath     with exertion   . Prostate enlargement   . Cardiac arrest 05/2014    Primary VF arrest with successful resuscitation and s/p ICD implant  . ICD (implantable cardioverter-defibrillator) in place 05/2014    Dr. [name]  . NICM (nonischemic cardiomyopathy)     Past Surgical History  Procedure Laterality Date  . Mouth surgery  1963  . Finger surgery  1954  . Colonoscopy w/ biopsies    . Knee arthroscopy Left   . Total knee arthroplasty Right 02/05/2013    Dr Mayer Camel  . Total knee arthroplasty Right 02/05/2013    Procedure: TOTAL KNEE ARTHROPLASTY;  Surgeon: Kerin Salen, MD;  Location: [facility];  Service: Orthopedics;  Laterality: Right;  . Total knee arthroplasty Left 01/09/2014    DR Mayer Camel  . Total knee arthroplasty Left 01/19/2014   Procedure: LEFT TOTAL KNEE ARTHROPLASTY;  Surgeon: Kerin Salen, MD;  Location: [facility];  Service: Orthopedics;  Laterality: Left;  . Left heart catheterization with coronary angiogram N/A 06/12/2014    Procedure: LEFT HEART CATHETERIZATION WITH CORONARY ANGIOGRAM;  Surgeon: Leonie Man, MD;  Location: [facility] CATH LAB;  Service: Cardiovascular;  Laterality: N/A;  . Bi-ventricular implantable cardioverter defibrillator N/A 06/18/2014    Procedure: BI-VENTRICULAR IMPLANTABLE CARDIOVERTER DEFIBRILLATOR  (CRT-D);  Surgeon: Deboraha Sprang, MD;  Location: [facility] CATH LAB;  Service: Cardiovascular;  Laterality: N/A;    There were no vitals filed for this visit.  Visit Diagnosis:  Weakness of left lower extremity  Proximal muscle weakness  Abnormality of gait  Decreased functional activity tolerance  Stiffness of right knee  Right leg weakness      Subjective Assessment - 09/01/14 1309    Subjective Had to stand for a couple hours on Tuesday which was a little too long. Was taking wedding pictures for an associate.   Currently in Pain? No/denies                         [facility] Adult PT Treatment/Exercise - 09/01/14 0001    Knee/Hip Exercises: Stretches   Active Hamstring Stretch 3 reps;30 seconds  Active Hamstring Stretch Limitations 14 inch box    Gastroc Stretch 3 reps;30 seconds   Gastroc Stretch Limitations slantboard    Knee/Hip Exercises: Aerobic   Stationary Bike Nu-step L 7 X 6 minutes   Knee/Hip Exercises: Standing   Forward Lunges Both;1 set;10 reps   Forward Lunges Limitations 4 inch box    Side Lunges Both;1 set;10 reps   Side Lunges Limitations 4 inch box    Lateral Step Up Step Height: 6";Both;15 reps;Hand Hold: 1   Forward Step Up Hand Hold: 1;Step Height: 6";15 reps;Both   Functional Squat 1 set;10 reps   Functional Squat Limitations squat then IR around 4in step                PT Education - 09/01/14 1340    Education provided Yes    Education Details continue with home exercises   Person(s) Educated Patient   Comprehension Verbalized understanding          PT Short Term Goals - 08/27/14 1331    PT SHORT TERM GOAL #1   Title Patient will demonstrate at least 4/5 to 4+/5 strength in bilateral lower extremities and at least 4-/5 strength in bilateral proximal hip musculature    Time 3   Period Weeks   Status On-going   PT SHORT TERM GOAL #2   Title Patient will demonstrate 0 degrees L knee extension and at least 125 degrees L knee flexion    Time 3   Period Weeks   Status On-going   PT SHORT TERM GOAL #3   Title Patient will be able to verbalize the importance of improved posture and will consistently demonstrate improved posture during all functional tasks and activities    Time 3   Period Weeks   Status On-going   PT SHORT TERM GOAL #4   Title Patient will demonstrate at least 40 degrees bilateral hip ER and at least 37 degrees bilateral hip IR    Time 3   Period Weeks   Status On-going           PT Long Term Goals - 08/27/14 1332    PT LONG TERM GOAL #1   Title Patient will be independent in correctly and consistently performing appropriate HEP    Time 6   Period Weeks   Status Achieved   PT LONG TERM GOAL #2   Title Patient will be able to ascend/descend full flight of stairs with no railings, good mechanics with no circumduction or hip hiking, and good mechanics and control with stair descent    Time 6   Period Weeks   Status On-going   PT LONG TERM GOAL #3   Title Patient will be able to participate in functional standing activities for at least 2-3 hours with no pain or feelings of weakness/fatigue in L lower extremity    Time 6   Period Weeks   Status On-going   PT LONG TERM GOAL #4   Title Patient will be unlimited in ambulation distances over even and uneven surfaces without a device and will not experience weakness or fatigue in knee during gait periods    Time 6   Period Weeks    Status On-going               Plan - 09/01/14 1343    Clinical Impression Statement Patient reports that he can tell that he is getting stronger. He demonstrated some ongoing weakness, most noted with stepups/down. Can benefit from further  strengthening.    Rehab Potential Good   PT Frequency 2x / week   PT Duration 4 weeks   PT Next Visit Plan continue to focus on strengthening for functional stability.   PT Home Exercise Plan modify as needed.    Consulted and Agree with Plan of Care Patient        Problem List Patient Active Problem List   Diagnosis Date Noted  . S/P implantation of automatic cardioverter/defibrillator (AICD) 07/26/2014  . Central venous catheter in place   . Hypertension 06/11/2014  . CKD (chronic kidney disease) stage 2, GFR 60-89 ml/min 06/11/2014  . DM type 2 causing renal disease 06/11/2014  . BPH (benign prostatic hypertrophy) 06/11/2014  . Obesity, morbid 06/11/2014  . Essential hypertension   . Congestive dilated cardiomyopathy 06/08/2014  . Cardiac arrest   . Acute respiratory failure with hypoxemia   . PEA (Pulseless electrical activity) 06/07/2014  . Anoxic encephalopathy   . BPH (benign prostatic hyperplasia) 01/24/2014  . Degenerative arthritis of left knee 01/19/2014  . Arthritis of left knee 01/19/2014  . Unspecified constipation 03/31/2013  . Difficulty in walking(719.7) 03/12/2013  . Stiffness of right knee 03/12/2013  . Right leg weakness 03/12/2013  . Osteoarthritis of right knee 02/06/2013  . LBBB (left bundle branch block) 01/07/2013  . Preoperative cardiovascular examination 01/07/2013  . DM2 (diabetes mellitus, type 2) 01/07/2013  . Hypothyroidism 01/07/2013  . Morbid obesity 01/07/2013  . HTN (hypertension) 01/07/2013  . Dyslipidemia 01/07/2013    Cassell Clement, PT, CSCS  09/01/2014, 1:54 PM  [facility] [address] [place], [place], [address] Phone: [phone]    Fax:  [phone]

## 2014-09-03 ENCOUNTER — Ambulatory Visit (HOSPITAL_COMMUNITY): Payer: Medicare Other | Admitting: Physical Therapy

## 2014-09-03 DIAGNOSIS — R269 Unspecified abnormalities of gait and mobility: Secondary | ICD-10-CM

## 2014-09-03 DIAGNOSIS — M6281 Muscle weakness (generalized): Secondary | ICD-10-CM | POA: Diagnosis not present

## 2014-09-03 DIAGNOSIS — R29898 Other symptoms and signs involving the musculoskeletal system: Secondary | ICD-10-CM

## 2014-09-03 DIAGNOSIS — R6889 Other general symptoms and signs: Secondary | ICD-10-CM

## 2014-09-03 NOTE — Therapy (Signed)
Rickey [place], [place], [address] Phone: [phone]   Fax:  [phone]  Physical Therapy Treatment  Patient Details  Name: Rickey Rice MRN: [id number] Date of Birth: 11-23-1940 Referring Provider:  Lucianne Lei, MD  Encounter Date: 09/03/2014      PT End of Session - 09/03/14 1447    Visit Number 12   Number of Visits 12   Date for PT Re-Evaluation 09/23/14   Authorization Type U[employer] Medicare    Authorization Time Period 07/29/14 to 09/28/14; G-code done 9th session   Authorization - Visit Number 11   Authorization - Number of Visits 20   PT Start Time 5701   PT Stop Time 1427   PT Time Calculation (min) 41 min   Activity Tolerance Patient tolerated treatment well   Behavior During Therapy [facility] for tasks assessed/performed      Past Medical History  Diagnosis Date  . Diabetes mellitus without complication     type 2  . Hypertension   . Hypothyroidism   . Morbid obesity   . Dyslipidemia   . LBBB (left bundle branch block)   . History of nuclear stress test 04/2012    lexiscan - 2 day protocol; low risk study, evidence of inferrior & apical scar but no ischemia   . Arthritis   . Shortness of breath     with exertion   . Prostate enlargement   . Cardiac arrest 05/2014    Primary VF arrest with successful resuscitation and s/p ICD implant  . ICD (implantable cardioverter-defibrillator) in place 05/2014    Dr. [name]  . NICM (nonischemic cardiomyopathy)     Past Surgical History  Procedure Laterality Date  . Mouth surgery  1963  . Finger surgery  1954  . Colonoscopy w/ biopsies    . Knee arthroscopy Left   . Total knee arthroplasty Right 02/05/2013    Dr Mayer Camel  . Total knee arthroplasty Right 02/05/2013    Procedure: TOTAL KNEE ARTHROPLASTY;  Surgeon: Kerin Salen, MD;  Location: [place];  Service: Orthopedics;  Laterality: Right;  . Total knee arthroplasty Left 01/09/2014    DR Mayer Camel  . Total knee  arthroplasty Left 01/19/2014    Procedure: LEFT TOTAL KNEE ARTHROPLASTY;  Surgeon: Kerin Salen, MD;  Location: [place];  Service: Orthopedics;  Laterality: Left;  . Left heart catheterization with coronary angiogram N/A 06/12/2014    Procedure: LEFT HEART CATHETERIZATION WITH CORONARY ANGIOGRAM;  Surgeon: Leonie Man, MD;  Location: [facility] CATH LAB;  Service: Cardiovascular;  Laterality: N/A;  . Bi-ventricular implantable cardioverter defibrillator N/A 06/18/2014    Procedure: BI-VENTRICULAR IMPLANTABLE CARDIOVERTER DEFIBRILLATOR  (CRT-D);  Surgeon: Deboraha Sprang, MD;  Location: [facility] CATH LAB;  Service: Cardiovascular;  Laterality: N/A;    There were no vitals filed for this visit.  Visit Diagnosis:  Weakness of left lower extremity  Proximal muscle weakness  Abnormality of gait  Decreased functional activity tolerance      Subjective Assessment - 09/03/14 1347    Subjective Pain free this afternoon; states he worked a weekend and ended up standing for a long long time, which seemed to have been a little much.    Pertinent History Patient had his left knee replaced in October 2015, but the knee still just feels weak and he continues to feel like he's a little unstable moving around. After patient had pacemaker put in he was taken off Celebrex, and as such as  been experiencing increased pain in his knee.    Currently in Pain? No/denies                         [facility] Adult PT Treatment/Exercise - 09/03/14 0001    Knee/Hip Exercises: Stretches   Active Hamstring Stretch 3 reps;30 seconds   Active Hamstring Stretch Limitations 14 inch box    Hip Flexor Stretch 3 reps;30 seconds   Hip Flexor Stretch Limitations stairs    Gastroc Stretch 3 reps;30 seconds   Gastroc Stretch Limitations slantboard    Knee/Hip Exercises: Standing   Heel Raises 1 set;20 reps   Forward Lunges Both;1 set;10 reps   Forward Lunges Limitations 2 inch box    Side Lunges Both;1 set;10 reps   Side  Lunges Limitations 2 inch box    Lateral Step Up Both;1 set;10 reps   Lateral Step Up Limitations 6 inch box    Forward Step Up Both;1 set;10 reps   Forward Step Up Limitations 8 inch box    Functional Squat 1 set;10 reps   Functional Squat Limitations hip IR walks    Rocker Board Limitations x20 AP, x20 lateral U HHA    Other Standing Knee Exercises --   Other Standing Knee Exercises 3D hip excursions 1x15 neutral stance                 PT Education - 09/03/14 1447    Education provided No          PT Short Term Goals - 08/27/14 1331    PT SHORT TERM GOAL #1   Title Patient will demonstrate at least 4/5 to 4+/5 strength in bilateral lower extremities and at least 4-/5 strength in bilateral proximal hip musculature    Time 3   Period Weeks   Status On-going   PT SHORT TERM GOAL #2   Title Patient will demonstrate 0 degrees L knee extension and at least 125 degrees L knee flexion    Time 3   Period Weeks   Status On-going   PT SHORT TERM GOAL #3   Title Patient will be able to verbalize the importance of improved posture and will consistently demonstrate improved posture during all functional tasks and activities    Time 3   Period Weeks   Status On-going   PT SHORT TERM GOAL #4   Title Patient will demonstrate at least 40 degrees bilateral hip ER and at least 37 degrees bilateral hip IR    Time 3   Period Weeks   Status On-going           PT Long Term Goals - 08/27/14 1332    PT LONG TERM GOAL #1   Title Patient will be independent in correctly and consistently performing appropriate HEP    Time 6   Period Weeks   Status Achieved   PT LONG TERM GOAL #2   Title Patient will be able to ascend/descend full flight of stairs with no railings, good mechanics with no circumduction or hip hiking, and good mechanics and control with stair descent    Time 6   Period Weeks   Status On-going   PT LONG TERM GOAL #3   Title Patient will be able to participate in  functional standing activities for at least 2-3 hours with no pain or feelings of weakness/fatigue in L lower extremity    Time 6   Period Weeks   Status On-going   PT  LONG TERM GOAL #4   Title Patient will be unlimited in ambulation distances over even and uneven surfaces without a device and will not experience weakness or fatigue in knee during gait periods    Time 6   Period Weeks   Status On-going               Plan - 09/03/14 1426    Clinical Impression Statement Continued functional exercises today with progressions of all exercises with good tolerance by patient. Focused on knee stability today. Patient able to perform all exercises with good form and good intrinsic feedback but did have difficulty with lateral step ups on 8 inch box, so activity was adjusted. Overall patient appears to be progressing well.    Pt will benefit from skilled therapeutic intervention in order to improve on the following deficits Abnormal gait;Decreased coordination;Decreased range of motion;Difficulty walking;Impaired flexibility;Postural dysfunction;Decreased activity tolerance;Decreased balance;Pain;Decreased mobility;Decreased strength   Rehab Potential Good   PT Frequency 2x / week   PT Duration 4 weeks   PT Treatment/Interventions ADLs/Self Care Home Management;Gait training;Neuromuscular re-education;Stair training;Functional mobility training;Patient/family education;Cryotherapy;Therapeutic activities;Therapeutic exercise;Manual techniques;Balance training   PT Next Visit Plan continue to focus on strengthening for functional stability.   PT Home Exercise Plan modify as needed.    Consulted and Agree with Plan of Care Patient        Problem List Patient Active Problem List   Diagnosis Date Noted  . S/P implantation of automatic cardioverter/defibrillator (AICD) 07/26/2014  . Central venous catheter in place   . Hypertension 06/11/2014  . CKD (chronic kidney disease) stage 2, GFR  60-89 ml/min 06/11/2014  . DM type 2 causing renal disease 06/11/2014  . BPH (benign prostatic hypertrophy) 06/11/2014  . Obesity, morbid 06/11/2014  . Essential hypertension   . Congestive dilated cardiomyopathy 06/08/2014  . Cardiac arrest   . Acute respiratory failure with hypoxemia   . PEA (Pulseless electrical activity) 06/07/2014  . Anoxic encephalopathy   . BPH (benign prostatic hyperplasia) 01/24/2014  . Degenerative arthritis of left knee 01/19/2014  . Arthritis of left knee 01/19/2014  . Unspecified constipation 03/31/2013  . Difficulty in walking(719.7) 03/12/2013  . Stiffness of right knee 03/12/2013  . Right leg weakness 03/12/2013  . Osteoarthritis of right knee 02/06/2013  . LBBB (left bundle branch block) 01/07/2013  . Preoperative cardiovascular examination 01/07/2013  . DM2 (diabetes mellitus, type 2) 01/07/2013  . Hypothyroidism 01/07/2013  . Morbid obesity 01/07/2013  . HTN (hypertension) 01/07/2013  . Dyslipidemia 01/07/2013    Deniece Ree PT, DPT [phone]  [place] [address] [place], [place], [address] Phone: [phone]   Fax:  [phone]

## 2014-09-09 ENCOUNTER — Ambulatory Visit (HOSPITAL_COMMUNITY): Payer: Medicare Other

## 2014-09-11 ENCOUNTER — Telehealth (HOSPITAL_COMMUNITY): Payer: Self-pay

## 2014-09-11 ENCOUNTER — Ambulatory Visit (HOSPITAL_COMMUNITY): Payer: Medicare Other

## 2014-09-11 NOTE — Telephone Encounter (Signed)
Called to check on patient no anwser, left message

## 2014-09-15 ENCOUNTER — Ambulatory Visit (INDEPENDENT_AMBULATORY_CARE_PROVIDER_SITE_OTHER): Payer: Medicare Other | Admitting: Urology

## 2014-09-15 DIAGNOSIS — N401 Enlarged prostate with lower urinary tract symptoms: Secondary | ICD-10-CM | POA: Diagnosis not present

## 2014-09-15 DIAGNOSIS — Z125 Encounter for screening for malignant neoplasm of prostate: Secondary | ICD-10-CM

## 2014-09-16 ENCOUNTER — Ambulatory Visit (HOSPITAL_COMMUNITY): Payer: Medicare Other

## 2014-09-16 DIAGNOSIS — M25661 Stiffness of right knee, not elsewhere classified: Secondary | ICD-10-CM

## 2014-09-16 DIAGNOSIS — R29898 Other symptoms and signs involving the musculoskeletal system: Secondary | ICD-10-CM

## 2014-09-16 DIAGNOSIS — M6281 Muscle weakness (generalized): Secondary | ICD-10-CM

## 2014-09-16 DIAGNOSIS — R6889 Other general symptoms and signs: Secondary | ICD-10-CM

## 2014-09-16 DIAGNOSIS — R293 Abnormal posture: Secondary | ICD-10-CM

## 2014-09-16 DIAGNOSIS — R269 Unspecified abnormalities of gait and mobility: Secondary | ICD-10-CM

## 2014-09-16 NOTE — Therapy (Signed)
[place], [place], [address] Phone: [phone]   Fax:  [phone]  Physical Therapy Treatment  Patient Details  Name: Rickey Rice MRN: [id number] Date of Birth: [birth year] Referring Provider:  Lucianne Lei, MD  Encounter Date: 09/16/2014      PT End of Session - 09/16/14 1441    Visit Number 12   Number of Visits 17   Date for PT Re-Evaluation 09/23/14   Authorization Type UHC Medicare    Authorization Time Period 07/29/14 to 09/28/14; G-code done 9th session   Authorization - Visit Number 12   Authorization - Number of Visits 20   PT Start Time 3893   PT Stop Time 1518   PT Time Calculation (min) 44 min   Activity Tolerance Patient tolerated treatment well   Behavior During Therapy [facility] for tasks assessed/performed      Past Medical History  Diagnosis Date  . Diabetes mellitus without complication     type 2  . Hypertension   . Hypothyroidism   . Morbid obesity   . Dyslipidemia   . LBBB (left bundle branch block)   . History of nuclear stress test 04/2012    lexiscan - 2 day protocol; low risk study, evidence of inferrior & apical scar but no ischemia   . Arthritis   . Shortness of breath     with exertion   . Prostate enlargement   . Cardiac arrest 05/2014    Primary VF arrest with successful resuscitation and s/p ICD implant  . ICD (implantable cardioverter-defibrillator) in place 05/2014    Dr. [name]  . NICM (nonischemic cardiomyopathy)     Past Surgical History  Procedure Laterality Date  . Mouth surgery  1963  . Finger surgery  1954  . Colonoscopy w/ biopsies    . Knee arthroscopy Left   . Total knee arthroplasty Right 02/05/2013    Dr Mayer Camel  . Total knee arthroplasty Right 02/05/2013    Procedure: TOTAL KNEE ARTHROPLASTY;  Surgeon: Kerin Salen, MD;  Location: [place];  Service: Orthopedics;  Laterality: Right;  . Total knee arthroplasty Left 01/09/2014    DR Mayer Camel  . Total  knee arthroplasty Left 01/19/2014    Procedure: LEFT TOTAL KNEE ARTHROPLASTY;  Surgeon: Kerin Salen, MD;  Location: [place];  Service: Orthopedics;  Laterality: Left;  . Left heart catheterization with coronary angiogram N/A 06/12/2014    Procedure: LEFT HEART CATHETERIZATION WITH CORONARY ANGIOGRAM;  Surgeon: Leonie Man, MD;  Location: [facility] CATH LAB;  Service: Cardiovascular;  Laterality: N/A;  . Bi-ventricular implantable cardioverter defibrillator N/A 06/18/2014    Procedure: BI-VENTRICULAR IMPLANTABLE CARDIOVERTER DEFIBRILLATOR  (CRT-D);  Surgeon: Deboraha Sprang, MD;  Location: [facility] CATH LAB;  Service: Cardiovascular;  Laterality: N/A;    There were no vitals filed for this visit.  Visit Diagnosis:  Weakness of left lower extremity  Proximal muscle weakness  Abnormality of gait  Decreased functional activity tolerance  Stiffness of right knee  Right leg weakness  Poor posture      Subjective Assessment - 09/16/14 1439    Subjective Pt was unable to get apt last week, feels stiff today.  Continues to go to [facility]   Currently in Pain? Yes   Pain Score 1    Pain Location Knee   Pain Orientation Left   Pain Descriptors / Indicators Sore               [facility] Adult  PT Treatment/Exercise - 09/16/14 0001    Exercises   Exercises Knee/Hip   Knee/Hip Exercises: Stretches   Active Hamstring Stretch 3 reps;30 seconds   Active Hamstring Stretch Limitations 14 inch box    Quad Stretch Both;3 reps;30 seconds   Quad Stretch Limitations standing with hands on chair and raise up chair for quad stretch   Hip Flexor Stretch 3 reps;30 seconds   Hip Flexor Stretch Limitations 14in step   Piriformis Stretch 3 reps;30 seconds   Piriformis Stretch Limitations supine figure 4 with towel   Gastroc Stretch 3 reps;30 seconds   Gastroc Stretch Limitations slantboard    Knee/Hip Exercises: Aerobic   Nustep Done at [employer] earlier today   Knee/Hip Exercises: Standing   Functional Squat 2  sets;10 reps   Functional Squat Limitations hip IR walks    Other Standing Knee Exercises 3D hip excursions 1x15 neutral stance    Knee/Hip Exercises: Supine   Bridges Both;15 reps              PT Short Term Goals - 09/16/14 1442    PT SHORT TERM GOAL #1   Title Patient will demonstrate at least 4/5 to 4+/5 strength in bilateral lower extremities and at least 4-/5 strength in bilateral proximal hip musculature    Status On-going   PT SHORT TERM GOAL #2   Title Patient will demonstrate 0 degrees L knee extension and at least 125 degrees L knee flexion    Status On-going   PT SHORT TERM GOAL #3   Title Patient will be able to verbalize the importance of improved posture and will consistently demonstrate improved posture during all functional tasks and activities    Status On-going   PT SHORT TERM GOAL #4   Title Patient will demonstrate at least 40 degrees bilateral hip ER and at least 37 degrees bilateral hip IR    Status On-going           PT Long Term Goals - 09/16/14 1442    PT LONG TERM GOAL #1   Title Patient will be independent in correctly and consistently performing appropriate HEP    Status Achieved   PT LONG TERM GOAL #2   Title Patient will be able to ascend/descend full flight of stairs with no railings, good mechanics with no circumduction or hip hiking, and good mechanics and control with stair descent    Status On-going   PT LONG TERM GOAL #3   Title Patient will be able to participate in functional standing activities for at least 2-3 hours with no pain or feelings of weakness/fatigue in L lower extremity    Status On-going   PT LONG TERM GOAL #4   Title Patient will be unlimited in ambulation distances over even and uneven surfaces without a device and will not experience weakness or fatigue in knee during gait periods    Status On-going               Plan - 09/16/14 1512    Clinical Impression Statement Session focus on improving overall hip  mobility and LE flexibilty following reports of not able to get apt.s last week and increased stiffness upon entrance today.  Added standing quad stretch to improve flexibility and gluteal strengthening exercises.  Pt able to perform all exercises with good form with minimal verbal cueing for weight distribution with squats.  No reports of pain at end of session.     PT Next Visit Plan continue to focus on  strengthening for functional stability.  Resume SLS and stair training next session.          Problem List Patient Active Problem List   Diagnosis Date Noted  . S/P implantation of automatic cardioverter/defibrillator (AICD) 07/26/2014  . Central venous catheter in place   . Hypertension 06/11/2014  . CKD (chronic kidney disease) stage 2, GFR 60-89 ml/min 06/11/2014  . DM type 2 causing renal disease 06/11/2014  . BPH (benign prostatic hypertrophy) 06/11/2014  . Obesity, morbid 06/11/2014  . Essential hypertension   . Congestive dilated cardiomyopathy 06/08/2014  . Cardiac arrest   . Acute respiratory failure with hypoxemia   . PEA (Pulseless electrical activity) 06/07/2014  . Anoxic encephalopathy   . BPH (benign prostatic hyperplasia) 01/24/2014  . Degenerative arthritis of left knee 01/19/2014  . Arthritis of left knee 01/19/2014  . Unspecified constipation 03/31/2013  . Difficulty in walking(719.7) 03/12/2013  . Stiffness of right knee 03/12/2013  . Right leg weakness 03/12/2013  . Osteoarthritis of right knee 02/06/2013  . LBBB (left bundle branch block) 01/07/2013  . Preoperative cardiovascular examination 01/07/2013  . DM2 (diabetes mellitus, type 2) 01/07/2013  . Hypothyroidism 01/07/2013  . Morbid obesity 01/07/2013  . HTN (hypertension) 01/07/2013  . Dyslipidemia 01/07/2013   Aldona Lento, PTA  Aldona Lento 09/16/2014, 3:17 PM  [place] [address] [place], [place], [address] Phone: [phone]    Fax:  [phone]

## 2014-09-18 ENCOUNTER — Ambulatory Visit (HOSPITAL_COMMUNITY): Payer: Medicare Other

## 2014-09-22 ENCOUNTER — Ambulatory Visit (HOSPITAL_COMMUNITY): Payer: Medicare Other | Admitting: Physical Therapy

## 2014-09-22 DIAGNOSIS — R293 Abnormal posture: Secondary | ICD-10-CM

## 2014-09-22 DIAGNOSIS — R6889 Other general symptoms and signs: Secondary | ICD-10-CM

## 2014-09-22 DIAGNOSIS — M25661 Stiffness of right knee, not elsewhere classified: Secondary | ICD-10-CM

## 2014-09-22 DIAGNOSIS — M6281 Muscle weakness (generalized): Secondary | ICD-10-CM

## 2014-09-22 DIAGNOSIS — R29898 Other symptoms and signs involving the musculoskeletal system: Secondary | ICD-10-CM

## 2014-09-22 DIAGNOSIS — R269 Unspecified abnormalities of gait and mobility: Secondary | ICD-10-CM

## 2014-09-22 NOTE — Therapy (Signed)
[place], [place], [address] Phone: [phone]   Fax:  [phone]  Physical Therapy Treatment  Patient Details  Name: Rickey Rice MRN: [id number] Date of Birth: 06-May-1940 Referring Provider:  Lucianne Lei, MD  Encounter Date: 09/22/2014      PT End of Session - 09/22/14 1508    Visit Number 13   Number of Visits 17   Date for PT Re-Evaluation 09/23/14   Authorization Type UHC Medicare    Authorization Time Period 07/29/14 to 09/28/14; G-code done 9th session   Authorization - Visit Number 13   Authorization - Number of Visits 20   PT Start Time 3810   PT Stop Time 1515   PT Time Calculation (min) 42 min   Activity Tolerance Patient tolerated treatment well   Behavior During Therapy [facility] for tasks assessed/performed      Past Medical History  Diagnosis Date  . Diabetes mellitus without complication     type 2  . Hypertension   . Hypothyroidism   . Morbid obesity   . Dyslipidemia   . LBBB (left bundle branch block)   . History of nuclear stress test 04/2012    lexiscan - 2 day protocol; low risk study, evidence of inferrior & apical scar but no ischemia   . Arthritis   . Shortness of breath     with exertion   . Prostate enlargement   . Cardiac arrest 05/2014    Primary VF arrest with successful resuscitation and s/p ICD implant  . ICD (implantable cardioverter-defibrillator) in place 05/2014    Dr. [name]  . NICM (nonischemic cardiomyopathy)     Past Surgical History  Procedure Laterality Date  . Mouth surgery  1963  . Finger surgery  1954  . Colonoscopy w/ biopsies    . Knee arthroscopy Left   . Total knee arthroplasty Right 02/05/2013    Dr Mayer Camel  . Total knee arthroplasty Right 02/05/2013    Procedure: TOTAL KNEE ARTHROPLASTY;  Surgeon: Kerin Salen, MD;  Location: [place];  Service: Orthopedics;  Laterality: Right;  . Total knee arthroplasty Left 01/09/2014    DR Mayer Camel  . Total  knee arthroplasty Left 01/19/2014    Procedure: LEFT TOTAL KNEE ARTHROPLASTY;  Surgeon: Kerin Salen, MD;  Location: [place];  Service: Orthopedics;  Laterality: Left;  . Left heart catheterization with coronary angiogram N/A 06/12/2014    Procedure: LEFT HEART CATHETERIZATION WITH CORONARY ANGIOGRAM;  Surgeon: Leonie Man, MD;  Location: [facility] CATH LAB;  Service: Cardiovascular;  Laterality: N/A;  . Bi-ventricular implantable cardioverter defibrillator N/A 06/18/2014    Procedure: BI-VENTRICULAR IMPLANTABLE CARDIOVERTER DEFIBRILLATOR  (CRT-D);  Surgeon: Deboraha Sprang, MD;  Location: [facility] CATH LAB;  Service: Cardiovascular;  Laterality: N/A;    There were no vitals filed for this visit.  Visit Diagnosis:  Weakness of left lower extremity  Proximal muscle weakness  Abnormality of gait  Decreased functional activity tolerance  Stiffness of right knee  Right leg weakness  Poor posture      Subjective Assessment - 09/22/14 1511    Subjective Pt states he went to [facility] yesterday and is feeling good today.  No pain,   Currently in Pain? No/denies                         [facility] Adult PT Treatment/Exercise - 09/22/14 1434    Knee/Hip Exercises: Stretches   Active Hamstring  Stretch 3 reps;30 seconds   Active Hamstring Stretch Limitations 14 inch box    Hip Flexor Stretch 3 reps;30 seconds   Hip Flexor Stretch Limitations 14in step   Piriformis Stretch 3 reps;30 seconds   Piriformis Stretch Limitations supine figure 4 with towel   Gastroc Stretch 3 reps;30 seconds   Gastroc Stretch Limitations slantboard    Knee/Hip Exercises: Aerobic   Stationary Bike Nu-step L 8 X 36minutes UE/LE seat and UE's on 14   Knee/Hip Exercises: Standing   Forward Lunges Both;1 set;15 reps   Forward Lunges Limitations 2 inch box    Side Lunges Both;1 set;15 reps   Side Lunges Limitations 2 inch box    Lateral Step Up Both;1 set;15 reps   Lateral Step Up Limitations 6 inch box    Forward  Step Up Both;1 set;15 reps   Forward Step Up Limitations 8 inch box    Stairs 2 RT    Other Standing Knee Exercises 3D hip excursions 1x15 neutral stance                   PT Short Term Goals - 09/16/14 1442    PT SHORT TERM GOAL #1   Title Patient will demonstrate at least 4/5 to 4+/5 strength in bilateral lower extremities and at least 4-/5 strength in bilateral proximal hip musculature    Status On-going   PT SHORT TERM GOAL #2   Title Patient will demonstrate 0 degrees L knee extension and at least 125 degrees L knee flexion    Status On-going   PT SHORT TERM GOAL #3   Title Patient will be able to verbalize the importance of improved posture and will consistently demonstrate improved posture during all functional tasks and activities    Status On-going   PT SHORT TERM GOAL #4   Title Patient will demonstrate at least 40 degrees bilateral hip ER and at least 37 degrees bilateral hip IR    Status On-going           PT Long Term Goals - 09/16/14 1442    PT LONG TERM GOAL #1   Title Patient will be independent in correctly and consistently performing appropriate HEP    Status Achieved   PT LONG TERM GOAL #2   Title Patient will be able to ascend/descend full flight of stairs with no railings, good mechanics with no circumduction or hip hiking, and good mechanics and control with stair descent    Status On-going   PT LONG TERM GOAL #3   Title Patient will be able to participate in functional standing activities for at least 2-3 hours with no pain or feelings of weakness/fatigue in L lower extremity    Status On-going   PT LONG TERM GOAL #4   Title Patient will be unlimited in ambulation distances over even and uneven surfaces without a device and will not experience weakness or fatigue in knee during gait periods    Status On-going               Plan - 09/22/14 1508    Clinical Impression Statement Continued improvement with hip mobility and LE flexibility.   PT consistent with participation at [facility].  Reduction of UE use with standing functional activities.  Improved abiltiy to negotiate stairs reciprocally using 7" height.     PT Next Visit Plan continue to focus on strengthening for functional stability. Progress dynamic balance tasks.          Problem List  Patient Active Problem List   Diagnosis Date Noted  . S/P implantation of automatic cardioverter/defibrillator (AICD) 07/26/2014  . Central venous catheter in place   . Hypertension 06/11/2014  . CKD (chronic kidney disease) stage 2, GFR 60-89 ml/min 06/11/2014  . DM type 2 causing renal disease 06/11/2014  . BPH (benign prostatic hypertrophy) 06/11/2014  . Obesity, morbid 06/11/2014  . Essential hypertension   . Congestive dilated cardiomyopathy 06/08/2014  . Cardiac arrest   . Acute respiratory failure with hypoxemia   . PEA (Pulseless electrical activity) 06/07/2014  . Anoxic encephalopathy   . BPH (benign prostatic hyperplasia) 01/24/2014  . Degenerative arthritis of left knee 01/19/2014  . Arthritis of left knee 01/19/2014  . Unspecified constipation 03/31/2013  . Difficulty in walking(719.7) 03/12/2013  . Stiffness of right knee 03/12/2013  . Right leg weakness 03/12/2013  . Osteoarthritis of right knee 02/06/2013  . LBBB (left bundle branch block) 01/07/2013  . Preoperative cardiovascular examination 01/07/2013  . DM2 (diabetes mellitus, type 2) 01/07/2013  . Hypothyroidism 01/07/2013  . Morbid obesity 01/07/2013  . HTN (hypertension) 01/07/2013  . Dyslipidemia 01/07/2013    Teena Irani, PTA/CLT [phone]  09/22/2014, 3:12 PM  [place] [address] [place], [place], [address] Phone: [phone]   Fax:  [phone]

## 2014-09-24 ENCOUNTER — Encounter (HOSPITAL_COMMUNITY): Payer: Medicare Other | Admitting: Physical Therapy

## 2014-09-29 ENCOUNTER — Ambulatory Visit (HOSPITAL_COMMUNITY): Payer: Medicare Other | Attending: Orthopedic Surgery | Admitting: Physical Therapy

## 2014-09-29 DIAGNOSIS — R29898 Other symptoms and signs involving the musculoskeletal system: Secondary | ICD-10-CM | POA: Insufficient documentation

## 2014-09-29 DIAGNOSIS — R269 Unspecified abnormalities of gait and mobility: Secondary | ICD-10-CM | POA: Insufficient documentation

## 2014-09-29 DIAGNOSIS — M6289 Other specified disorders of muscle: Secondary | ICD-10-CM | POA: Insufficient documentation

## 2014-09-30 ENCOUNTER — Ambulatory Visit: Payer: Medicare Other | Admitting: Internal Medicine

## 2014-10-01 ENCOUNTER — Ambulatory Visit (HOSPITAL_COMMUNITY): Payer: Medicare Other | Admitting: Physical Therapy

## 2014-10-01 DIAGNOSIS — R269 Unspecified abnormalities of gait and mobility: Secondary | ICD-10-CM | POA: Diagnosis present

## 2014-10-01 DIAGNOSIS — M6281 Muscle weakness (generalized): Secondary | ICD-10-CM

## 2014-10-01 DIAGNOSIS — M6289 Other specified disorders of muscle: Secondary | ICD-10-CM | POA: Diagnosis present

## 2014-10-01 DIAGNOSIS — R29898 Other symptoms and signs involving the musculoskeletal system: Secondary | ICD-10-CM | POA: Diagnosis present

## 2014-10-01 NOTE — Therapy (Signed)
[place], [place], [address] Phone: [phone]   Fax:  [phone]  Physical Therapy Treatment  Patient Details  Name: Rickey Rice MRN: [id number] Date of Birth: 04-16-1940 Referring Provider:  [name], MD  Encounter Date: 10/01/2014      PT End of Session - 10/01/14 1424    Visit Number 14   Number of Visits 14   Date for PT Re-Evaluation 09/23/14   Authorization Type [employer]    Authorization - Visit Number 14   Authorization - Number of Visits 14   PT Start Time 2706   PT Stop Time 1423   PT Time Calculation (min) 28 min   Activity Tolerance Patient tolerated treatment well      Past Medical History  Diagnosis Date  . Diabetes mellitus without complication     type 2  . Hypertension   . Hypothyroidism   . Morbid obesity   . Dyslipidemia   . LBBB (left bundle branch block)   . History of nuclear stress test 04/2012    lexiscan - 2 day protocol; low risk study, evidence of inferrior & apical scar but no ischemia   . Arthritis   . Shortness of breath     with exertion   . Prostate enlargement   . Cardiac arrest 05/2014    Primary VF arrest with successful resuscitation and s/p ICD implant  . ICD (implantable cardioverter-defibrillator) in place 05/2014    Dr. [name]  . NICM (nonischemic cardiomyopathy)     Past Surgical History  Procedure Laterality Date  . Mouth surgery  1963  . Finger surgery  1954  . Colonoscopy w/ biopsies    . Knee arthroscopy Left   . Total knee arthroplasty Right 02/05/2013    Dr Mayer Camel  . Total knee arthroplasty Right 02/05/2013    Procedure: TOTAL KNEE ARTHROPLASTY;  Surgeon: Kerin Salen, MD;  Location: [place];  Service: Orthopedics;  Laterality: Right;  . Total knee arthroplasty Left 01/09/2014    DR Mayer Camel  . Total knee arthroplasty Left 01/19/2014    Procedure: LEFT TOTAL KNEE ARTHROPLASTY;  Surgeon: Kerin Salen, MD;  Location: [place];  Service:  Orthopedics;  Laterality: Left;  . Left heart catheterization with coronary angiogram N/A 06/12/2014    Procedure: LEFT HEART CATHETERIZATION WITH CORONARY ANGIOGRAM;  Surgeon: Leonie Man, MD;  Location: [facility] CATH LAB;  Service: Cardiovascular;  Laterality: N/A;  . Bi-ventricular implantable cardioverter defibrillator N/A 06/18/2014    Procedure: BI-VENTRICULAR IMPLANTABLE CARDIOVERTER DEFIBRILLATOR  (CRT-D);  Surgeon: Deboraha Sprang, MD;  Location: [facility] CATH LAB;  Service: Cardiovascular;  Laterality: N/A;    There were no vitals filed for this visit.  Visit Diagnosis:  Weakness of left lower extremity  Proximal muscle weakness  Abnormality of gait      Subjective Assessment - 10/01/14 1357    Subjective Pt states that he is doing better.  He is feels that he has more stability in his knee and is no longer using his cane.    Pertinent History Patient had his left knee replaced in October 2015, but the knee still just feels weak and he continues to feel like he's a little unstable moving around. After patient had pacemaker put in he was taken off Celebrex, and as such as been experiencing increased pain in his knee.    How long can you sit comfortably? 6/2- unlimited    How long can you stand  comfortably? able to stand for 3 hours now    How long can you walk comfortably? able to walk for over 30 minutes    Patient Stated Goals get knee a lot stronger    Currently in Pain? No/denies            [facility] PT Assessment - 10/01/14 0001    Assessment   Medical Diagnosis L knee weakness, ROM deficit    Onset Date/Surgical Date 07/08/14  approximate    Next MD Visit October for knee doctor   heart doctor in July; GP in July as well    Precautions   Precautions ICD/Pacemaker   Restrictions   Weight Bearing Restrictions No   Prior Function   Level of Independence Independent with basic ADLs;Independent with gait;Independent with transfers   Vocation Retired   Lawyer,  wants to travel some    Posture/Postural Control   Posture Comments increased lumbar lordosis, flat thoracic spine, flexed trunk, flexed knees    AROM   Right Hip External Rotation  30   Right Hip Internal Rotation  31   Left Hip External Rotation  33   Left Hip Internal Rotation  28   Left Knee Extension 0  ws 4   Left Knee Flexion 130  was 121   Right Ankle Dorsiflexion 14   Left Ankle Dorsiflexion 16   Strength   Right Hip Flexion 5/5  was 2/5   Right Hip ABduction 3+/5   Left Hip Flexion 4+/5  was 2/5    Left Hip Extension 4-/5   Left Hip ABduction 3+/5  was 2+/5   Right Knee Flexion 4-/5   Right Knee Extension 5/5  was 4/5   Left Knee Flexion 5/5  was 3/5    Left Knee Extension 5/5  was 4-/5   Right Ankle Dorsiflexion 5/5   Left Ankle Dorsiflexion 5/5  was 4+/5   Ambulation/Gait   Gait Comments --                     [facility] Adult PT Treatment/Exercise - 10/01/14 0001    Knee/Hip Exercises: Seated   Sit to Sand 5 reps   Knee/Hip Exercises: Supine   Bridges 15 reps   Knee/Hip Exercises: Sidelying   Hip ABduction 10 reps    SLR x 10 reps               PT Short Term Goals - 10/01/14 1414    PT SHORT TERM GOAL #1   Title Patient will demonstrate at least 4/5 to 4+/5 strength in bilateral lower extremities and at least 4-/5 strength in bilateral proximal hip musculature    Time 3   Period Weeks   Status Achieved   PT SHORT TERM GOAL #2   Title Patient will demonstrate 0 degrees L knee extension and at least 125 degrees L knee flexion    Time 3   Period Weeks   Status Achieved   PT SHORT TERM GOAL #3   Title Patient will be able to verbalize the importance of improved posture and will consistently demonstrate improved posture during all functional tasks and activities    Time 3   Period Weeks   Status Achieved   PT SHORT TERM GOAL #4   Title Patient will demonstrate at least 40 degrees bilateral hip ER and at least 37 degrees  bilateral hip IR    Time 3   Period Weeks   Status On-going  PT Long Term Goals - 2014-10-18 1415    PT LONG TERM GOAL #1   Title Patient will be independent in correctly and consistently performing appropriate HEP    Period Weeks   Status Achieved   PT LONG TERM GOAL #2   Title Patient will be able to ascend/descend full flight of stairs with no railings, good mechanics with no circumduction or hip hiking, and good mechanics and control with stair descent    Time 6   Period Weeks   Status On-going   PT LONG TERM GOAL #3   Title Patient will be able to participate in functional standing activities for at least 2-3 hours with no pain or feelings of weakness/fatigue in L lower extremity    Time 6   Period Weeks   Status On-going   PT LONG TERM GOAL #4   Title Patient will be unlimited in ambulation distances over even and uneven surfaces without a device and will not experience weakness or fatigue in knee during gait periods    Time 6   Period Weeks   Status Achieved               Plan - 18-Oct-2014 1424    Clinical Impression Statement Pt has met all goals, is walking without a cane and is back at the [facility].  Pt is no longer in need of skilled PT and will be discharged.    PT Next Visit Plan discharge.          G-Codes - October 18, 2014 1423    Functional Assessment Tool Used Based on skilled clinical assessment focusing on general strength, gait abnormalities, postural deviations   Functional Limitation Mobility: Walking and moving around   Mobility: Walking and Moving Around Goal Status [phone]) At least 1 percent but less than 20 percent impaired, limited or restricted   Mobility: Walking and Moving Around Discharge Status [phone]) At least 1 percent but less than 20 percent impaired, limited or restricted      Problem List Patient Active Problem List   Diagnosis Date Noted  . S/P implantation of automatic cardioverter/defibrillator (AICD) 07/26/2014  . Central  venous catheter in place   . Hypertension 06/11/2014  . CKD (chronic kidney disease) stage 2, GFR 60-89 ml/min 06/11/2014  . DM type 2 causing renal disease 06/11/2014  . BPH (benign prostatic hypertrophy) 06/11/2014  . Obesity, morbid 06/11/2014  . Essential hypertension   . Congestive dilated cardiomyopathy 06/08/2014  . Cardiac arrest   . Acute respiratory failure with hypoxemia   . PEA (Pulseless electrical activity) 06/07/2014  . Anoxic encephalopathy   . BPH (benign prostatic hyperplasia) 01/24/2014  . Degenerative arthritis of left knee 01/19/2014  . Arthritis of left knee 01/19/2014  . Unspecified constipation 03/31/2013  . Difficulty in walking(719.7) 03/12/2013  . Stiffness of right knee 03/12/2013  . Right leg weakness 03/12/2013  . Osteoarthritis of right knee 02/06/2013  . LBBB (left bundle branch block) 01/07/2013  . Preoperative cardiovascular examination 01/07/2013  . DM2 (diabetes mellitus, type 2) 01/07/2013  . Hypothyroidism 01/07/2013  . Morbid obesity 01/07/2013  . HTN (hypertension) 01/07/2013  . Dyslipidemia 01/07/2013   Rayetta Humphrey, PT CLT [phone] October 18, 2014, 2:25 PM  [place] [address] [place], [place], [address] Phone: [phone]   Fax:  [phone]    PHYSICAL THERAPY DISCHARGE SUMMARY   Plan: Patient agrees to discharge.  Patient goals were met. Patient is being discharged due to meeting the stated rehab  goals.  ?????    Rayetta Humphrey, [place] CLT [phone]

## 2014-10-01 NOTE — Patient Instructions (Signed)
Bridging   Slowly raise buttocks from floor, keeping stomach tight. Repeat _10___ times per set. Do __1__ sets per session. Do 2____ sessions per day.  http://orth.exer.us/1096   Copyright  VHI. All rights reserved.  Strengthening: Hip Abduction (Side-Lying)   Tighten muscles on front of left thigh, then lift leg 15____ inches from surface, keeping knee locked.  Repeat _10___ times per set. Do _1___ sets per session. Do __2 times a day  http://orth.exer.us/622   Copyright  VHI. All rights reserved.  Functional Quadriceps: Chair Squat   Keeping feet flat on floor, shoulder width apart, squat as low as is comfortable. Use support as necessary. Repeat ___10_ times per set. Do _1___ sets per session. Do 2____ sessions per day.  http://orth.exer.us/736   Copyright  VHI. All rights reserved.  Functional Quadriceps: Sit to Stand   Sit on edge of chair, feet flat on floor. Stand upright, extending knees fully. Repeat __10__ times per set. Do __1__ sets per session. Do _2___ sessions per day.  http://orth.exer.us/734   Copyright  VHI. All rights reserved.

## 2014-10-06 ENCOUNTER — Other Ambulatory Visit: Payer: Self-pay | Admitting: Internal Medicine

## 2014-10-07 ENCOUNTER — Encounter: Payer: Self-pay | Admitting: Internal Medicine

## 2014-10-07 ENCOUNTER — Ambulatory Visit (INDEPENDENT_AMBULATORY_CARE_PROVIDER_SITE_OTHER): Payer: Medicare Other | Admitting: Internal Medicine

## 2014-10-07 VITALS — BP 118/78 | HR 51 | Ht 71.0 in | Wt 295.0 lb

## 2014-10-07 DIAGNOSIS — I420 Dilated cardiomyopathy: Secondary | ICD-10-CM

## 2014-10-07 DIAGNOSIS — I447 Left bundle-branch block, unspecified: Secondary | ICD-10-CM

## 2014-10-07 DIAGNOSIS — Z8674 Personal history of sudden cardiac arrest: Secondary | ICD-10-CM

## 2014-10-07 DIAGNOSIS — Z95810 Presence of automatic (implantable) cardiac defibrillator: Secondary | ICD-10-CM | POA: Diagnosis not present

## 2014-10-07 DIAGNOSIS — I10 Essential (primary) hypertension: Secondary | ICD-10-CM | POA: Diagnosis not present

## 2014-10-07 NOTE — Telephone Encounter (Signed)
Rx(s) sent to pharmacy electronically.  

## 2014-10-07 NOTE — Patient Instructions (Signed)
Your physician wants you to follow-up in: 6 months with Dr. [name]. You will receive a reminder letter in the mail two months in advance. If you don't receive a letter, please call our office to schedule the follow-up appointment.    

## 2014-10-07 NOTE — Progress Notes (Signed)
OFFICE NOTE  Chief Complaint:  Hospital follow-up  Primary Care Physician: Elyn Peers, MD  HPI:  [name]  is a 74 year old gentleman with a history of diabetes, hypertension, hypothyroidism, and morbid obesity. In 2010 he had complaints of chest pain and underwent stress testing with [facility] which was a 2-day nuclear stress test and was apparently negative. Recently he underwent a colonoscopy and a preoperative EKG was abnormal. I unfortunately do not have that EKG to review, but I did review the EKG from your office on April 09, 2012 which showed a borderline intraventricular conduction delay, a sinus rhythm at 66, and poor R-wave progression. Today in the office he Rickey an abnormal EKG demonstrating a left bundle branch block with a QRS duration of 168 msec, heart rate of 78 in sinus. His only symptoms are shortness of breath with exertion. He underwent evaluation of his left bundle branch block in February 2014. This is a 2 day nuclear stress test which was negative for ischemia. There was a small inferior and apical defect which could represent scar versus artifact. EF was mildly reduced at 49%.    Rickey Rice returns today for followup. He underwent knee replacement surgery as we had authorized him to do. He is tolerated surgery without any complications. He reports that he is recovering fairly well and is more mobile than he had been in the past. He started to do some exercises at the [facility] and also this will translate into weight loss. Blood pressure Rickey been stable he denies any chest pain.  He had blood work in January from his primary care provider which showed a total cholesterol of 128, HDL 51, triglycerides 53 and LDL 65. His hemoglobin A1c was 6.2.  Rickey Rice is now contemplating lethargy. Unfortunately Rickey not been able to walk enough to lose weight in fact Rickey gained some weight. 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 He feels like it's time to retire work on his generalized health. I would tend to agree with this.  I saw Rickey Rice back in the office today. He recently followed up from the hospital after having a sudden cardiac arrest. Unfortunately he was revived and is now status post AICD. He was to have CRT-D therapy, however there was difficulty with the LV lead, therefore he only Rickey a single ventricle pacing with defibrillator functions. He is tolerating this well and had a small episode with fever postoperatively, but this was thought to be due to possibly a UTI. There is no evidence of pacemaker site fluctuance or swelling. Overall he feels he is doing well. He started exercising is managed to lose some weight. He is less short of breath and is more active.  Rickey Rice returns today for follow-up. Overall he is doing exceedingly well. He's managed to lose about 10 pounds. He is now below 300 pounds. His shortness of breath is improved some and he is ambulating better on his knees. He's had no device problems including no firings. He denies any chest pain or worsening shortness of breath and Rickey not had any presyncopal or syncopal episodes.  PMHx:  Past Medical History  Diagnosis Date  . Diabetes mellitus without complication     type 2  . Hypertension   . Hypothyroidism   . Morbid obesity   . Dyslipidemia   . LBBB (left bundle branch block)   . History of nuclear stress test 04/2012  lexiscan - 2 day protocol; low risk study, evidence of inferrior & apical scar but no ischemia   . Arthritis   . Shortness of breath     with exertion   . Prostate enlargement   . Cardiac arrest 05/2014    Primary VF arrest with successful resuscitation and s/p ICD implant  . ICD (implantable cardioverter-defibrillator) in place 05/2014    Dr. [name]  . NICM (nonischemic cardiomyopathy)     Past Surgical History  Procedure Laterality Date  . Mouth surgery  1963  . Finger surgery  1954  . Colonoscopy  w/ biopsies    . Knee arthroscopy Left   . Total knee arthroplasty Right 02/05/2013    Dr Mayer Camel  . Total knee arthroplasty Right 02/05/2013    Procedure: TOTAL KNEE ARTHROPLASTY;  Surgeon: Kerin Salen, MD;  Location: [place];  Service: Orthopedics;  Laterality: Right;  . Total knee arthroplasty Left 01/09/2014    DR Mayer Camel  . Total knee arthroplasty Left 01/19/2014    Procedure: LEFT TOTAL KNEE ARTHROPLASTY;  Surgeon: Kerin Salen, MD;  Location: [place];  Service: Orthopedics;  Laterality: Left;  . Left heart catheterization with coronary angiogram N/A 06/12/2014    Procedure: LEFT HEART CATHETERIZATION WITH CORONARY ANGIOGRAM;  Surgeon: Leonie Man, MD;  Location: [facility] CATH LAB;  Service: Cardiovascular;  Laterality: N/A;  . Bi-ventricular implantable cardioverter defibrillator N/A 06/18/2014    Procedure: BI-VENTRICULAR IMPLANTABLE CARDIOVERTER DEFIBRILLATOR  (CRT-D);  Surgeon: Deboraha Sprang, MD;  Location: [facility] CATH LAB;  Service: Cardiovascular;  Laterality: N/A;    FAMHx:  Family History  Problem Relation Age of Onset  . Cancer Mother   . Kidney disease Brother   . Hypertension Sister     SOCHx:   reports that he Rickey never smoked. He Rickey never used smokeless tobacco. He reports that he does not drink alcohol or use illicit drugs.  ALLERGIES:  Allergies  Allergen Reactions  . Bee Venom Swelling    Swelling on lips and tongue  . Demerol [Meperidine]   . Pioglitazone     Lips swelling  . Shrimp [Shellfish Allergy]     rash  . Sulfa Antibiotics     ROS: A comprehensive review of systems was negative except for: Constitutional: positive for weight loss Respiratory: positive for dyspnea on exertion Musculoskeletal: positive for stiff joints and knee pain  HOME MEDS: Current Outpatient Prescriptions  Medication Sig Dispense Refill  . acetaminophen (TYLENOL) 325 MG tablet Take 1-2 tablets (325-650 mg total) by mouth every 4 (four) hours as needed for mild pain.    Marland Kitchen  aspirin EC 81 MG tablet Take 81 mg by mouth daily.    . carvedilol (COREG) 6.25 MG tablet TAKE 1 TABLET(6.25 MG) BY MOUTH TWICE DAILY WITH A MEAL 60 tablet 8  . furosemide (LASIX) 40 MG tablet TAKE 1 TABLET(40 MG) BY MOUTH DAILY 30 tablet 8  . glimepiride (AMARYL) 4 MG tablet Take 8 mg by mouth daily before breakfast.     . levothyroxine (SYNTHROID, LEVOTHROID) 75 MCG tablet Take 75 mcg by mouth daily before breakfast.    . lisinopril (PRINIVIL,ZESTRIL) 10 MG tablet TAKE 1 TABLET(10 MG) BY MOUTH DAILY 30 tablet 8  . LUTEIN ESTERS PO Take 1 capsule by mouth every other day.     . rosuvastatin (CRESTOR) 10 MG tablet Take 1 tablet (10 mg total) by mouth daily at 6 PM. 30 tablet 0  . saxagliptin HCl (ONGLYZA) 5 MG TABS tablet  Take 5 mg by mouth daily.    . tamsulosin (FLOMAX) 0.4 MG CAPS capsule Take 1 capsule by mouth daily.     No current facility-administered medications for this visit.    LABS/IMAGING: No results found for this or any previous visit (from the past 48 hour(s)). No results found.  VITALS: BP 118/78 mmHg  Pulse 51  Ht 5\' 11"  (1.803 m)  Wt 295 lb (133.811 kg)  BMI 41.16 kg/m2  EXAM: General appearance: alert, no distress and morbidly obese Neck: no carotid bruit, no JVD and thyroid not enlarged, symmetric, no tenderness/mass/nodules Lungs: clear to auscultation bilaterally and AICD in left upper chest Heart: regular rate and rhythm, S1, S2 normal, no murmur, click, rub or gallop Abdomen: soft, non-tender; bowel sounds normal; no masses,  no organomegaly and Morbidly obese Extremities: extremities normal, atraumatic, no cyanosis or edema Pulses: 2+ and symmetric Skin: Skin color, texture, turgor normal. No rashes or lesions  EKG: Sinus bradycardia, left bundle branch block and possible V pacing  ASSESSMENT: 1. Status post aborted sudden cardiac death-status post placement of a St. Jude Bi-V AICD (LV lead is disabled due to dysfunction) 2. Negative nuclear stress  test for ischemia, fixed inferoapical defect suggesting possible scar versus artifact, EF 49% 3.   Diabetes type 2 on oral medications - A1c 6.2 4.   Hypertension-well controlled 5.   Dyslipidemia on statin - well controlled  PLAN: 1.   Rickey Rice is recovering from aborted sudden cardiac death. He underwent defibrillator placement with a St. Jude CRT-D device, however the left ventricular lead is not functional. EF in the hospitals down to 40%. He was on Aldactone however this was held due to hyperkalemia. He's currently on carvedilol, Lasix and lisinopril. For now we'll continue his current medications. I've encouraged him to continue exercise and work on weight loss, as he's noticed he's feeling much better and is related to this. I'll plan to see him back in 6 months for close follow-up.  Pixie Casino, MD, [facility] Attending Cardiologist [place] 10/07/2014, 1:00 PM

## 2014-10-23 ENCOUNTER — Other Ambulatory Visit: Payer: Self-pay | Admitting: Internal Medicine

## 2014-10-23 NOTE — Telephone Encounter (Signed)
REFILL 

## 2015-04-27 ENCOUNTER — Ambulatory Visit (INDEPENDENT_AMBULATORY_CARE_PROVIDER_SITE_OTHER): Payer: Medicare Other | Admitting: Internal Medicine

## 2015-04-27 ENCOUNTER — Encounter: Payer: Self-pay | Admitting: Internal Medicine

## 2015-04-27 VITALS — BP 116/74 | HR 59 | Ht 71.0 in | Wt 296.6 lb

## 2015-04-27 DIAGNOSIS — Z95810 Presence of automatic (implantable) cardiac defibrillator: Secondary | ICD-10-CM

## 2015-04-27 DIAGNOSIS — I255 Ischemic cardiomyopathy: Secondary | ICD-10-CM

## 2015-04-27 DIAGNOSIS — I447 Left bundle-branch block, unspecified: Secondary | ICD-10-CM

## 2015-04-27 DIAGNOSIS — I420 Dilated cardiomyopathy: Secondary | ICD-10-CM | POA: Diagnosis not present

## 2015-04-27 DIAGNOSIS — Z8674 Personal history of sudden cardiac arrest: Secondary | ICD-10-CM

## 2015-04-27 DIAGNOSIS — I10 Essential (primary) hypertension: Secondary | ICD-10-CM

## 2015-04-27 NOTE — Progress Notes (Signed)
OFFICE NOTE  Chief Complaint:  Routine follow-up  Primary Care Physician: Elyn Peers, MD  HPI:  Rickey Rice  is a 75 year old gentleman with a history of diabetes, hypertension, hypothyroidism, and morbid obesity. In 2010 he had complaints of chest pain and underwent stress testing with [facility] which was a 2-day nuclear stress test and was apparently negative. Recently he underwent a colonoscopy and a preoperative EKG was abnormal. I unfortunately do not have that EKG to review, but I did review the EKG from your office on April 09, 2012 which showed a borderline intraventricular conduction delay, a sinus rhythm at 66, and poor R-wave progression. Today in the office he has an abnormal EKG demonstrating a left bundle branch block with a QRS duration of 168 msec, heart rate of 78 in sinus. His only symptoms are shortness of breath with exertion. He underwent evaluation of his left bundle branch block in February 2014. This is a 2 day nuclear stress test which was negative for ischemia. There was a small inferior and apical defect which could represent scar versus artifact. EF was mildly reduced at 49%.    Rickey Rice returns today for followup. He underwent knee replacement surgery as we had authorized him to do. He is tolerated surgery without any complications. He reports that he is recovering fairly well and is more mobile than he had been in the past. He started to do some exercises at the [facility] and also this will translate into weight loss. Blood pressure has been stable he denies any chest pain.  He had blood work in January from his primary care provider which showed a total cholesterol of 128, HDL 51, triglycerides 53 and LDL 65. His hemoglobin A1c was 6.2.  [name] is now contemplating lethargy. Unfortunately has not been able to walk enough to lose weight in fact has gained some weight. 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 He feels like it's time to retire work on his generalized health. I would tend to agree with this.  I saw Rickey Rice back in the office today. He recently followed up from the hospital after having a sudden cardiac arrest. Unfortunately he was revived and is now status post AICD. He was to have CRT-D therapy, however there was difficulty with the LV lead, therefore he only has a single ventricle pacing with defibrillator functions. He is tolerating this well and had a small episode with fever postoperatively, but this was thought to be due to possibly a UTI. There is no evidence of pacemaker site fluctuance or swelling. Overall he feels he is doing well. He started exercising is managed to lose some weight. He is less short of breath and is more active.  Rickey Rice returns today for follow-up. Overall he is doing exceedingly well. He's managed to lose about 10 pounds. He is now below 300 pounds. His shortness of breath is improved some and he is ambulating better on his knees. He's had no device problems including no firings. He denies any chest pain or worsening shortness of breath and has not had any presyncopal or syncopal episodes.  Rickey Rice returns today for follow-up. Unfortunately he's gained some of his weight back. He denies a chest pain or worsening shortness of breath. He does not have any swelling. His last EF was as mentioned 40% in March 2016. EKG shows left bundle branch block. He scheduled to see Dr. [name] back for device interrogation  next month.  PMHx:  Past Medical History  Diagnosis Date  . Diabetes mellitus without complication ([place])     type 2  . Hypertension   . Hypothyroidism   . Morbid obesity ([place])   . Dyslipidemia   . LBBB (left bundle branch block)   . History of nuclear stress test 04/2012    lexiscan - 2 day protocol; low risk study, evidence of inferrior & apical scar but no ischemia   . Arthritis   . Shortness of breath     with exertion   .  Prostate enlargement   . Cardiac arrest ([place]) 05/2014    Primary VF arrest with successful resuscitation and s/p ICD implant  . ICD (implantable cardioverter-defibrillator) in place 05/2014    Dr. [name]  . NICM (nonischemic cardiomyopathy) [facility])     Past Surgical History  Procedure Laterality Date  . Mouth surgery  1963  . Finger surgery  1954  . Colonoscopy w/ biopsies    . Knee arthroscopy Left   . Total knee arthroplasty Right 02/05/2013    Dr Mayer Camel  . Total knee arthroplasty Right 02/05/2013    Procedure: TOTAL KNEE ARTHROPLASTY;  Surgeon: Kerin Salen, MD;  Location: [place];  Service: Orthopedics;  Laterality: Right;  . Total knee arthroplasty Left 01/09/2014    DR Mayer Camel  . Total knee arthroplasty Left 01/19/2014    Procedure: LEFT TOTAL KNEE ARTHROPLASTY;  Surgeon: Kerin Salen, MD;  Location: [place];  Service: Orthopedics;  Laterality: Left;  . Left heart catheterization with coronary angiogram N/A 06/12/2014    Procedure: LEFT HEART CATHETERIZATION WITH CORONARY ANGIOGRAM;  Surgeon: Leonie Man, MD;  Location[facility] C[facility] LAB;  Service: Cardiovascular;  Laterality: N/A;  . Bi-ventricular implantable cardioverter defibrillator N/A 06/18/2014    Procedure: BI-VENTRICULAR IMPLANTABLE CARDIOVERTER DEFIBRILLATOR  (CRT-D);  Surgeon: Deboraha Sprang, MD;  Location: [facility] CATH LAB;  Service: Cardiovascular;  Laterality: N/A;    FAMHx:  Family History  Problem Relation Age of Onset  . Cancer Mother   . Kidney disease Brother   . Hypertension Sister     SOCHx:   reports that he has never smoked. He has never used smokeless tobacco. He reports that he does not drink alcohol or use illicit drugs.  ALLERGIES:  Allergies  Allergen Reactions  . Bee Venom Swelling    Swelling on lips and tongue  . Demerol [Meperidine]   . Pioglitazone     Lips swelling  . Shrimp [Shellfish Allergy]     rash  . Sulfa Antibiotics     ROS: A comprehensive review of systems was negative.  HOME  MEDS: Current Outpatient Prescriptions  Medication Sig Dispense Refill  . amoxicillin (AMOXIL) 500 MG capsule Take 500 mg by mouth as directed. Prior to dental/periodontal appointments    . aspirin EC 81 MG tablet Take 81 mg by mouth daily.    . carvedilol (COREG) 6.25 MG tablet TAKE 1 TABLET(6.25 MG) BY MOUTH TWICE DAILY WITH A MEAL 60 tablet 5  . furosemide (LASIX) 40 MG tablet TAKE 1 TABLET(40 MG) BY MOUTH DAILY 30 tablet 8  . glimepiride (AMARYL) 4 MG tablet Take 8 mg by mouth daily before breakfast.     . levothyroxine (SYNTHROID, LEVOTHROID) 75 MCG tablet Take 75 mcg by mouth daily before breakfast.    . lisinopril (PRINIVIL,ZESTRIL) 10 MG tablet TAKE 1 TABLET(10 MG) BY MOUTH DAILY 30 tablet 8  . LUTEIN ESTERS PO Take 1 capsule by mouth every  other day.     . rosuvastatin (CRESTOR) 10 MG tablet Take 1 tablet (10 mg total) by mouth daily at 6 PM. 30 tablet 0  . saxagliptin HCl (ONGLYZA) 5 MG TABS tablet Take 5 mg by mouth daily.    . tamsulosin (FLOMAX) 0.4 MG CAPS capsule Take 1 capsule by mouth daily.     No current facility-administered medications for this visit.    LABS/IMAGING: No results found for this or any previous visit (from the past 48 hour(s)). No results found.  VITALS: BP 116/74 mmHg  Pulse 59  Ht 5\' 11"  (1.803 m)  Wt 296 lb 9.6 oz (134.537 kg)  BMI 41.39 kg/m2  EXAM: General appearance: alert, no distress and morbidly obese Neck: no carotid bruit, no JVD and thyroid not enlarged, symmetric, no tenderness/mass/nodules Lungs: clear to auscultation bilaterally and AICD in left upper chest Heart: regular rate and rhythm, S1, S2 normal, no murmur, click, rub or gallop Abdomen: soft, non-tender; bowel sounds normal; no masses,  no organomegaly and Morbidly obese Extremities: extremities normal, atraumatic, no cyanosis or edema Pulses: 2+ and symmetric Skin: Skin color, texture, turgor normal. No rashes or lesions  EKG: Sinus bradycardia with  First-degree AV  block at 59, LBBB  ASSESSMENT: 1. Status post aborted sudden cardiac death-status post placement of a [employer] Bi-V AICD (LV lead is disabled due to dysfunction) 2. Negative nuclear stress test for ischemia, fixed inferoapical defect suggesting possible scar versus artifact, EF 49% 3.   Diabetes type 2 on oral medications - A1c 5.8 4.   Hypertension-well controlled 5.   Dyslipidemia on statin - well controlled 6.   Ischemic cardiomyopathy-EF 40%  PLAN: 1.   Mr. Rohlik seems to be stable and managed to lose some weight but gained some back. He needs to start exercising more work on calorie restriction. I do not see any signs of decompensated heart failure. He is on 40 mg daily Lasix. He will have his device reassess next month with Dr. [name]. He recently had lab work this summer which showed stable findings. Creatinine was 1.46. His cholesterol is well controlled with total cholesterol 128, triglycerides 49, HDL 47 and LDL 71. Hemoglobin A1c is at goal 5.8.  We will plan to recheck an echocardiogram in March, one year after his last study to see if LV function has improved.  Pixie Casino, MD, [facility] Attending Cardiologist [place] [facility] Milam Allbaugh 04/27/2015, 10:56 AM

## 2015-04-27 NOTE — Patient Instructions (Signed)
Your physician has requested that you have an echocardiogram in MARCH 2017. Echocardiography is a painless test that uses sound waves to create images of your heart. It provides your doctor with information about the size and shape of your heart and how well your heart's chambers and valves are working. This procedure takes approximately one hour. There are no restrictions for this procedure.  Dr Debara Pickett recommends that you schedule a follow-up appointment in 6 months. You will receive a reminder letter in the mail two months in advance. If you don't receive a letter, please call our office to schedule the follow-up appointment.  If you need a refill on your cardiac medications before your next appointment, please call your pharmacy.

## 2015-04-28 ENCOUNTER — Encounter: Payer: Self-pay | Admitting: Internal Medicine

## 2015-05-04 LAB — HM DIABETES EYE EXAM

## 2015-05-07 ENCOUNTER — Encounter: Payer: Self-pay | Admitting: Internal Medicine

## 2015-05-07 ENCOUNTER — Ambulatory Visit (INDEPENDENT_AMBULATORY_CARE_PROVIDER_SITE_OTHER): Payer: Medicare Other | Admitting: Internal Medicine

## 2015-05-07 VITALS — BP 138/76 | HR 54 | Ht 71.5 in | Wt 297.4 lb

## 2015-05-07 DIAGNOSIS — I429 Cardiomyopathy, unspecified: Secondary | ICD-10-CM | POA: Diagnosis not present

## 2015-05-07 DIAGNOSIS — I428 Other cardiomyopathies: Secondary | ICD-10-CM

## 2015-05-07 DIAGNOSIS — I447 Left bundle-branch block, unspecified: Secondary | ICD-10-CM

## 2015-05-07 DIAGNOSIS — I10 Essential (primary) hypertension: Secondary | ICD-10-CM

## 2015-05-07 DIAGNOSIS — Z95810 Presence of automatic (implantable) cardiac defibrillator: Secondary | ICD-10-CM | POA: Diagnosis not present

## 2015-05-07 DIAGNOSIS — I469 Cardiac arrest, cause unspecified: Secondary | ICD-10-CM

## 2015-05-07 NOTE — Progress Notes (Signed)
Patient Care Team: Lucianne Lei, MD as PCP - General (Family Medicine)   HPI  Rickey Rice is a 75 y.o. male Seen in follow-up for CRT-D implantation for aborted cardiac arrest in the setting of nonischemic cardiac myopathy and left bundle branch block.  Because of a short RV LV interval, it was ELECTED NOT to activate the LV leads    He has modest shortness of breath. He struggles with some peripheral edema. He has had no interval chest pain. Dr. Myles Gip interval notes have been reviewed    Catheterization had demonstrated an ejection fraction of 40%  Coronaries were clean      Past Medical History  Diagnosis Date  . Diabetes mellitus without complication ([place])     type 2  . Hypertension   . Hypothyroidism   . Morbid obesity ([place])   . Dyslipidemia   . LBBB (left bundle branch block)   . History of nuclear stress test 04/2012    lexiscan - 2 day protocol; low risk study, evidence of inferrior & apical scar but no ischemia   . Arthritis   . Shortness of breath     with exertion   . Prostate enlargement   . Cardiac arrest ([place]) 05/2014    Primary VF arrest with successful resuscitation and s/p ICD implant  . ICD (implantable cardioverter-defibrillator) in place 05/2014    Dr. [name]  . NICM (nonischemic cardiomyopathy) [facility])     Past Surgical History  Procedure Laterality Date  . Mouth surgery  1963  . Finger surgery  1954  . Colonoscopy w/ biopsies    . Knee arthroscopy Left   . Total knee arthroplasty Right 02/05/2013    Dr Mayer Camel  . Total knee arthroplasty Right 02/05/2013    Procedure: TOTAL KNEE ARTHROPLASTY;  Surgeon: Kerin Salen, MD;  Location: [place];  Service: Orthopedics;  Laterality: Right;  . Total knee arthroplasty Left 01/09/2014    DR Mayer Camel  . Total knee arthroplasty Left 01/19/2014    Procedure: LEFT TOTAL KNEE ARTHROPLASTY;  Surgeon: Kerin Salen, MD;  Location: [place];  Service: Orthopedics;  Laterality: Left;  . Left heart catheterization  with coronary angiogram N/A 06/12/2014    Procedure: LEFT HEART CATHETERIZATION WITH CORONARY ANGIOGRAM;  Surgeon: Leonie Man, MD;  Location: [facility] CATH LAB;  Service: Cardiovascular;  Laterality: N/A;  . Bi-ventricular implantable cardioverter defibrillator N/A 06/18/2014    Procedure: BI-VENTRICULAR IMPLANTABLE CARDIOVERTER DEFIBRILLATOR  (CRT-D);  Surgeon: Deboraha Sprang, MD;  Location: [facility] CATH LAB;  Service: Cardiovascular;  Laterality: N/A;    Current Outpatient Prescriptions  Medication Sig Dispense Refill  . amoxicillin (AMOXIL) 500 MG capsule Take 500 mg by mouth as directed. Prior to dental/periodontal appointments    . aspirin EC 81 MG tablet Take 81 mg by mouth daily.    . carvedilol (COREG) 6.25 MG tablet TAKE 1 TABLET(6.25 MG) BY MOUTH TWICE DAILY WITH A MEAL 60 tablet 5  . furosemide (LASIX) 40 MG tablet TAKE 1 TABLET(40 MG) BY MOUTH DAILY 30 tablet 8  . glimepiride (AMARYL) 4 MG tablet Take 8 mg by mouth daily before breakfast.     . levothyroxine (SYNTHROID, LEVOTHROID) 75 MCG tablet Take 75 mcg by mouth daily before breakfast.    . lisinopril (PRINIVIL,ZESTRIL) 10 MG tablet TAKE 1 TABLET(10 MG) BY MOUTH DAILY 30 tablet 8  . LUTEIN ESTERS PO Take 1 capsule by mouth every other day.     . rosuvastatin (CRESTOR)  10 MG tablet Take 1 tablet (10 mg total) by mouth daily at 6 PM. 30 tablet 0  . saxagliptin HCl (ONGLYZA) 5 MG TABS tablet Take 5 mg by mouth daily.    . tamsulosin (FLOMAX) 0.4 MG CAPS capsule Take 1 capsule by mouth daily.     No current facility-administered medications for this visit.    Allergies  Allergen Reactions  . Bee Venom Swelling    Swelling on lips and tongue  . Demerol [Meperidine]   . Pioglitazone     Lips swelling  . Shrimp [Shellfish Allergy]     rash  . Sulfa Antibiotics       Review of Systems negative except from HPI and PMH  Physical Exam BP 138/76 mmHg  Pulse 54  Ht 5' 11.5" (1.816 m)  Wt 297 lb 6.4 oz (134.9 kg)  BMI 40.91  kg/m2 Well developed and Morbidly obese  in no acute distress HENT normal E scleral and icterus clear Neck Supple JVP flat; carotids brisk and full Clear to ausculation Device pocket well healed; without hematoma or erythema.  There is no tethering   Regular rate and rhythm, no murmurs gallops or rub Soft with active bowel sounds No clubbing cyanosis  1-2+ Edema Alert and oriented, grossly normal motor and sensory function Skin Warm and Dry  Multiple ECG recorded. They will be reviewed. Briefly with SS. Suffice to say a left bundle branch block QRS was noted with the below described changes.  Assessment and  Plan  NICM  Aborted cardiac Arrest  CRT-D implant  CHF chronic-systolic  The patient is volume overloaded. We will have him increase his Lasix to twice a day every other day for 2 weeks.  We spent a long time looking at his LV lead. Radiographically there is significant separation from the RV lead particularly in  Distal pole. Electrocardiographically 1 to call oil is later than one to or to the coil.  We then used the ECG to look at QRS with and changes in the activation sequence. Programming 1 as the cathode we were able to force the inversion oflead 1 and a significant narrowing of the V1.  There is some diaphragmatic stimulation which limits our outputs.  We will weight to see how it is that he feels with a 1-2 configuration;  His  Intrinsic AV delay is about 250. However, it was interesting that it was only about 130 ms at we seem to lose fusion. Right   More than 50% of 45 min was spent in counseling related to the above

## 2015-05-07 NOTE — Patient Instructions (Signed)
Medication Instructions: - take lasix (furosemide) 40 mg one tablet by mouth twice daily every other day for 2 weeks. - on the other days take 40 mg once daily   Labwork: - none  Procedures/Testing: - none  Follow-Up: - Please call Raquel Sarna back next Tuesday to let her know how you are feeling.  - Remote monitoring is used to monitor your Pacemaker of ICD from home. This monitoring reduces the number of office visits required to check your device to one time per year. It allows [place] to keep an eye on the functioning of your device to ensure it is working properly. You are scheduled for a device check from home on 08/09/15. You may send your transmission at any time that day. If you have a wireless device, the transmission will be sent automatically. After your physician reviews your transmission, you will receive a postcard with your next transmission date.  - Your physician wants you to follow-up in: 1 year with Dr. [name]. You will receive a reminder letter in the mail two months in advance. If you don't receive a letter, please call our office to schedule the follow-up appointment.    Any Additional Special Instructions Will Be Listed Below (If Applicable).     If you need a refill on your cardiac medications before your next appointment, please call your pharmacy.

## 2015-05-11 ENCOUNTER — Telehealth: Payer: Self-pay | Admitting: Cardiology

## 2015-05-11 NOTE — Telephone Encounter (Signed)
Returned patient's call.  He states that he is feeling better this week than he has "in awhile".  He states he still "notices" his heartbeat when he stands up, but that it is not the same as before (diaphragmatic stim).  Patient is aware to call if diaphragmatic stim develops, or if he has questions or concerns.  Patient denies any additional questions at this time.

## 2015-05-11 NOTE — Telephone Encounter (Signed)
Pt returning your call. Please call him back.

## 2015-05-12 ENCOUNTER — Other Ambulatory Visit: Payer: Self-pay | Admitting: Cardiology

## 2015-05-12 NOTE — Telephone Encounter (Signed)
Rx(s) sent to pharmacy electronically.  

## 2015-05-16 LAB — CUP PACEART INCLINIC DEVICE CHECK
Brady Statistic RA Percent Paced: 0 %
Implantable Lead Implant Date: 20160324
Implantable Lead Location: 753858
Implantable Lead Location: 753859
Implantable Lead Model: 7122
Lead Channel Impedance Value: 837.5 Ohm
Lead Channel Pacing Threshold Amplitude: 0.75 V
Lead Channel Pacing Threshold Pulse Width: 0.5 ms
Lead Channel Sensing Intrinsic Amplitude: 12 mV
Lead Channel Sensing Intrinsic Amplitude: 2.7 mV
Lead Channel Setting Pacing Amplitude: 2 V
Lead Channel Setting Pacing Amplitude: 2.5 V
Lead Channel Setting Pacing Pulse Width: 0.5 ms
Lead Channel Setting Sensing Sensitivity: 0.5 mV
MDC IDC LEAD IMPLANT DT: 20160324
MDC IDC LEAD IMPLANT DT: 20160324
MDC IDC LEAD LOCATION: 753860
MDC IDC MSMT BATTERY REMAINING LONGEVITY: 103.2
MDC IDC MSMT LEADCHNL LV PACING THRESHOLD AMPLITUDE: 1 V
MDC IDC MSMT LEADCHNL LV PACING THRESHOLD PULSEWIDTH: 1 ms
MDC IDC MSMT LEADCHNL RA IMPEDANCE VALUE: 387.5 Ohm
MDC IDC MSMT LEADCHNL RA PACING THRESHOLD AMPLITUDE: 1 V
MDC IDC MSMT LEADCHNL RV PACING THRESHOLD PULSEWIDTH: 0.5 ms
MDC IDC PG SERIAL: 7240350
MDC IDC SESS DTM: 20170210174301
MDC IDC SET LEADCHNL LV PACING AMPLITUDE: 1.75 V
MDC IDC SET LEADCHNL LV PACING PULSEWIDTH: 1 ms
MDC IDC STAT BRADY RV PERCENT PACED: 0.25 %

## 2015-05-26 ENCOUNTER — Other Ambulatory Visit: Payer: Self-pay | Admitting: Cardiology

## 2015-05-26 NOTE — Telephone Encounter (Signed)
Rx(s) sent to pharmacy electronically.  

## 2015-06-07 ENCOUNTER — Ambulatory Visit (HOSPITAL_COMMUNITY): Payer: Medicare Other | Attending: Cardiology

## 2015-06-07 ENCOUNTER — Other Ambulatory Visit: Payer: Self-pay

## 2015-06-07 DIAGNOSIS — I255 Ischemic cardiomyopathy: Secondary | ICD-10-CM | POA: Insufficient documentation

## 2015-06-07 DIAGNOSIS — I358 Other nonrheumatic aortic valve disorders: Secondary | ICD-10-CM | POA: Insufficient documentation

## 2015-06-07 DIAGNOSIS — I371 Nonrheumatic pulmonary valve insufficiency: Secondary | ICD-10-CM | POA: Diagnosis not present

## 2015-06-07 DIAGNOSIS — I119 Hypertensive heart disease without heart failure: Secondary | ICD-10-CM | POA: Diagnosis not present

## 2015-06-07 DIAGNOSIS — R29898 Other symptoms and signs involving the musculoskeletal system: Secondary | ICD-10-CM | POA: Insufficient documentation

## 2015-08-09 ENCOUNTER — Ambulatory Visit (INDEPENDENT_AMBULATORY_CARE_PROVIDER_SITE_OTHER): Payer: Medicare Other | Admitting: *Deleted

## 2015-08-09 ENCOUNTER — Telehealth: Payer: Self-pay | Admitting: Internal Medicine

## 2015-08-09 ENCOUNTER — Telehealth: Payer: Self-pay | Admitting: Cardiology

## 2015-08-09 DIAGNOSIS — I429 Cardiomyopathy, unspecified: Secondary | ICD-10-CM

## 2015-08-09 DIAGNOSIS — I469 Cardiac arrest, cause unspecified: Secondary | ICD-10-CM

## 2015-08-09 DIAGNOSIS — Z95810 Presence of automatic (implantable) cardiac defibrillator: Secondary | ICD-10-CM | POA: Diagnosis not present

## 2015-08-09 DIAGNOSIS — I428 Other cardiomyopathies: Secondary | ICD-10-CM

## 2015-08-09 NOTE — Progress Notes (Signed)
Remote ICD transmission.   

## 2015-08-09 NOTE — Telephone Encounter (Signed)
Returned patient's call.  He is attempting to send his scheduled remote transmission.  Patient reports he fell asleep in his family room so his monitor did not transmit automatically.  Attempted to assist patient in sending a manual transmission.  Instructed patient to call tech services as monitor is not transmitting.  Patient verbalizes understanding and states that he will call tech services soon.  Transmission successfully received within a few minutes of patient hanging up the phone.  Called patient back to make him aware.  No alerts or abnormalities noted.  Patient made aware.  He states that he has been more fatigued lately and that his PCP advised it may be related to a recent extended course of antibiotics.  Advised patient that CRT function is appropriate and that his thoracic impedance is stable.  Patient verbalizes understanding and appreciation.  He denies additional questions or concerns at this time.

## 2015-08-09 NOTE — Telephone Encounter (Signed)
LMOVM reminding pt to send remote transmission.   

## 2015-08-09 NOTE — Telephone Encounter (Signed)
°  1. Has your device fired?no  2. Is you device beeping? yes  3. Are you experiencing draining or swelling at device site? no  4. Are you calling to see if we received your device transmission? No  5. Have you passed out? No

## 2015-09-02 LAB — CUP PACEART REMOTE DEVICE CHECK
Battery Remaining Longevity: 78 mo
Brady Statistic AP VP Percent: 1 %
Brady Statistic AP VS Percent: 1 %
Brady Statistic AS VS Percent: 1 %
HIGH POWER IMPEDANCE MEASURED VALUE: 59 Ohm
HighPow Impedance: 59 Ohm
Implantable Lead Implant Date: 20160324
Implantable Lead Location: 753858
Implantable Lead Location: 753859
Implantable Lead Location: 753860
Implantable Lead Model: 7122
Lead Channel Impedance Value: 390 Ohm
Lead Channel Impedance Value: 840 Ohm
Lead Channel Pacing Threshold Amplitude: 1 V
Lead Channel Pacing Threshold Pulse Width: 1 ms
Lead Channel Sensing Intrinsic Amplitude: 12 mV
Lead Channel Sensing Intrinsic Amplitude: 3.1 mV
Lead Channel Setting Pacing Amplitude: 2 V
Lead Channel Setting Pacing Amplitude: 2.5 V
Lead Channel Setting Pacing Pulse Width: 0.5 ms
Lead Channel Setting Sensing Sensitivity: 0.5 mV
MDC IDC LEAD IMPLANT DT: 20160324
MDC IDC LEAD IMPLANT DT: 20160324
MDC IDC MSMT BATTERY REMAINING PERCENTAGE: 85 %
MDC IDC MSMT BATTERY VOLTAGE: 3.04 V
MDC IDC MSMT LEADCHNL RV IMPEDANCE VALUE: 510 Ohm
MDC IDC PG SERIAL: 7240350
MDC IDC SESS DTM: 20170515150903
MDC IDC SET LEADCHNL LV PACING AMPLITUDE: 1.75 V
MDC IDC SET LEADCHNL LV PACING PULSEWIDTH: 1 ms
MDC IDC STAT BRADY AS VP PERCENT: 98 %
MDC IDC STAT BRADY RA PERCENT PACED: 1 %

## 2015-09-08 ENCOUNTER — Encounter: Payer: Self-pay | Admitting: Cardiology

## 2015-09-21 ENCOUNTER — Other Ambulatory Visit (HOSPITAL_COMMUNITY)
Admission: RE | Admit: 2015-09-21 | Discharge: 2015-09-21 | Disposition: A | Discharge status: Home / Self Care | Payer: Medicare Other | Source: Other Acute Inpatient Hospital | Attending: Urology | Admitting: Urology

## 2015-09-21 ENCOUNTER — Ambulatory Visit (INDEPENDENT_AMBULATORY_CARE_PROVIDER_SITE_OTHER): Payer: Medicare Other | Admitting: Urology

## 2015-09-21 DIAGNOSIS — N400 Enlarged prostate without lower urinary tract symptoms: Secondary | ICD-10-CM | POA: Diagnosis not present

## 2015-09-21 DIAGNOSIS — N390 Urinary tract infection, site not specified: Secondary | ICD-10-CM | POA: Insufficient documentation

## 2015-09-21 LAB — URINE MICROSCOPIC-ADD ON
BACTERIA UA: NONE SEEN
RBC / HPF: NONE SEEN RBC/hpf (ref 0–5)
Squamous Epithelial / LPF: NONE SEEN

## 2015-09-21 LAB — URINALYSIS, ROUTINE W REFLEX MICROSCOPIC
Bilirubin Urine: NEGATIVE
GLUCOSE, UA: NEGATIVE mg/dL
Ketones, ur: NEGATIVE mg/dL
LEUKOCYTES UA: NEGATIVE
Nitrite: NEGATIVE
PH: 6 (ref 5.0–8.0)
Protein, ur: NEGATIVE mg/dL
SPECIFIC GRAVITY, URINE: 1.015 (ref 1.005–1.030)

## 2015-09-22 ENCOUNTER — Telehealth: Payer: Self-pay | Admitting: Physician Assistant

## 2015-09-22 ENCOUNTER — Encounter: Payer: Self-pay | Admitting: Cardiology

## 2015-09-22 NOTE — Telephone Encounter (Signed)
Received records from [facility] for appointment on 09/23/15 with Rosaria Ferries, ,PA.  Records given to [employer] (medical records) for Rhonda's schedule on 09/23/15. lp

## 2015-09-23 ENCOUNTER — Encounter: Payer: Self-pay | Admitting: Physician Assistant

## 2015-09-23 ENCOUNTER — Ambulatory Visit (INDEPENDENT_AMBULATORY_CARE_PROVIDER_SITE_OTHER): Payer: Medicare Other | Admitting: Physician Assistant

## 2015-09-23 VITALS — BP 128/82 | HR 64 | Ht 71.5 in | Wt 300.0 lb

## 2015-09-23 DIAGNOSIS — I10 Essential (primary) hypertension: Secondary | ICD-10-CM

## 2015-09-23 DIAGNOSIS — I428 Other cardiomyopathies: Secondary | ICD-10-CM

## 2015-09-23 DIAGNOSIS — I5023 Acute on chronic systolic (congestive) heart failure: Secondary | ICD-10-CM

## 2015-09-23 DIAGNOSIS — I429 Cardiomyopathy, unspecified: Secondary | ICD-10-CM | POA: Diagnosis not present

## 2015-09-23 NOTE — Patient Instructions (Signed)
Medication Instructions:   TAKE EXTRA 40 MG OF FUROSEMIDE Friday, Sunday AND TUESDAY  Labwork:  Your physician recommends that you return for lab work WITH DR BLAND= BMP  Follow-Up:  Your physician recommends that you schedule a follow-up appointment in: WITH DR [name] IN [place]

## 2015-09-23 NOTE — Progress Notes (Signed)
Cardiology Office Note   Date:  09/24/2015   ID:  Rickey Rice, DOB [birth year], MRN [id number]  PCP:  Rickey Peers, MD  Cardiologist:  Dr Rickey Rice, Dr Rickey Portela, PA-C   Chief Complaint  Patient presents with  . Fatigue    History of Present Illness: Rickey Rice is a 75 y.o. male with a history of NICM w/ aborted cardiac arrest w/ CRT-D (LV lead not activated due to short RV LV interval), nl cors w/ EF 40% at cath 05/2014, DM, HTN, hypothyroid, morbid obesity, LBBB   Rickey Rice presents for Evaluation of weight gain and fatigue.  The weight gain and fatigue was much more severe than it is now. He was determined to have a urinary tract infection and has completed treatment for this. Since he completed his antibiotics, the back pain and fatigue have greatly improved. He feels generally well but is still a bit sluggish. He is also having some increased dyspnea on exertion.   He's gained 10 pounds by his home scales. He feels his dry weight is 284 pounds. He has some dyspnea on exertion, and some orthopnea but not PND. He also has some mild lower extremity edema, worse during the day.   Past Medical History  Diagnosis Date  . Diabetes mellitus without complication ([place])     type 2  . Hypertension   . Hypothyroidism   . Morbid obesity ([place])   . Dyslipidemia   . LBBB (left bundle branch block)   . History of nuclear stress test 04/2012    lexiscan - 2 day protocol; low risk study, evidence of inferrior & apical scar but no ischemia   . Arthritis   . Shortness of breath     with exertion   . Prostate enlargement   . Cardiac arrest ([place]) 05/2014    Primary VF arrest with successful resuscitation and s/p ICD implant  . ICD (implantable cardioverter-defibrillator) in place 05/2014    Dr. [name]  . NICM (nonischemic cardiomyopathy) [facility])     Past Surgical History  Procedure Laterality Date  . Mouth surgery  1963  . Finger surgery  1954  . Colonoscopy  w/ biopsies    . Knee arthroscopy Left   . Total knee arthroplasty Right 02/05/2013    Dr Mayer Camel  . Total knee arthroplasty Right 02/05/2013    Procedure: TOTAL KNEE ARTHROPLASTY;  Surgeon: Kerin Salen, MD;  Location: [place];  Service: Orthopedics;  Laterality: Right;  . Total knee arthroplasty Left 01/09/2014    DR Mayer Camel  . Total knee arthroplasty Left 01/19/2014    Procedure: LEFT TOTAL KNEE ARTHROPLASTY;  Surgeon: Kerin Salen, MD;  Location: [place];  Service: Orthopedics;  Laterality: Left;  . Left heart catheterization with coronary angiogram N/A 06/12/2014    Procedure: LEFT HEART CATHETERIZATION WITH CORONARY ANGIOGRAM;  Surgeon: Leonie Man, MD;  Location: [facility] CATH LAB;  Service: Cardiovascular;  Laterality: N/A;  . Bi-ventricular implantable cardioverter defibrillator N/A 06/18/2014    Procedure: BI-VENTRICULAR IMPLANTABLE CARDIOVERTER DEFIBRILLATOR  (CRT-D);  Surgeon: Deboraha Sprang, MD;  Location: [facility] CATH LAB;  Service: Cardiovascular;  Laterality: N/A;    Current Outpatient Prescriptions  Medication Sig Dispense Refill  . amoxicillin (AMOXIL) 500 MG capsule Take 500 mg by mouth as directed. Prior to dental/periodontal appointments    . aspirin EC 81 MG tablet Take 81 mg by mouth daily.    . carvedilol (COREG) 6.25 MG tablet TAKE 1 TABLET(6.25  MG) BY MOUTH TWICE DAILY WITH A MEAL 60 tablet 5  . furosemide (LASIX) 40 MG tablet TAKE 1 TABLET(40 MG) BY MOUTH DAILY 30 tablet 11  . glimepiride (AMARYL) 4 MG tablet Take 8 mg by mouth daily before breakfast.     . levothyroxine (SYNTHROID, LEVOTHROID) 75 MCG tablet Take 75 mcg by mouth daily before breakfast.    . lisinopril (PRINIVIL,ZESTRIL) 10 MG tablet TAKE 1 TABLET(10 MG) BY MOUTH DAILY 30 tablet 10  . LUTEIN ESTERS PO Take 1 capsule by mouth every other day.     . rosuvastatin (CRESTOR) 10 MG tablet Take 1 tablet (10 mg total) by mouth daily at 6 PM. 30 tablet 0  . saxagliptin HCl (ONGLYZA) 5 MG TABS tablet Take 5 mg by mouth  daily.    . tamsulosin (FLOMAX) 0.4 MG CAPS capsule Take 0.4 mg by mouth every other day.      No current facility-administered medications for this visit.    Allergies:   Bee venom; Demerol; Pioglitazone; Shrimp; and Sulfa antibiotics    Social History:  The patient  reports that he has never smoked. He has never used smokeless tobacco. He reports that he does not drink alcohol or use illicit drugs.   Family History:  The patient's family history includes Cancer in his mother; Hypertension in his sister; Kidney disease in his brother.    ROS:  Please see the history of present illness. All other systems are reviewed and negative.    PHYSICAL EXAM: VS:  BP 128/82 mmHg  Pulse 64  Ht 5' 11.5" (1.816 m)  Wt 300 lb (136.079 kg)  BMI 41.26 kg/m2 , BMI Body mass index is 41.26 kg/(m^2). GEN: Well nourished, well developed, male in no acute distress HEENT: normal for age  Neck: JVD 8 cm, no carotid bruit, no masses Cardiac: RRR; soft murmur, no rubs, or gallops Respiratory:  Decreased breath sounds bases with a few rales, normal work of breathing GI: soft, nontender, nondistended, + BS MS: no deformity or atrophy; trace edema; distal pulses are 2+ in all 4 extremities  Skin: warm and dry, no rash Neuro:  Strength and sensation are intact Psych: euthymic mood, full affect   EKG:  EKG is ordered today. The ekg ordered today demonstrates atrial sensed, V pacing with a heart rate is 64 QRS duration 180 ms, no change   Recent Labs: No results found for requested labs within last 365 days.    Lipid Panel    Component Value Date/Time   TRIG 68 06/10/2014 0400     Wt Readings from Last 3 Encounters:  09/23/15 300 lb (136.079 kg)  05/07/15 297 lb 6.4 oz (134.9 kg)  04/27/15 296 lb 9.6 oz (134.537 kg)     Other studies Reviewed: Additional studies/ records that were reviewed today include: Office notes, hospital records and testing.  ASSESSMENT AND PLAN:  1.  Acute on  chronic systolic CHF: His weight is trending up, and he has some symptoms of volume overload by exam. However, his blood pressure is well controlled. We will increase his Lasix by taking an extra tablet every other day for 3 doses. He is to get a BMET after that at his follow-up appointment with Dr. Criss Rosales. He is to follow-up with Dr. Debara Rice in August. He is encouraged to limit sodium in his diet.  2. Hypertension: His blood pressure is well controlled, no med changes. He is to let us know if taking the extra Lasix gives him problems  with his blood pressure.  3. Nonischemic cardiomyopathy: He is on good medical therapy with carvedilol and lisinopril, continue these   Current medicines are reviewed at length with the patient today.  The patient does not have concerns regarding medicines.  The following changes have been made:  Temporarily increase Lasix  Labs/ tests ordered today include:   Orders Placed This Encounter  Procedures  . Basic Metabolic Panel (BMET)  . EKG 12-Lead     Disposition:   FU with Dr. Debara Rice  Signed, Rosaria Ferries, PA-C  09/24/2015 8:23 AM    [place] Phone: [phone]; Fax: [phone]  This note was written with the assistance of speech recognition software. Please excuse any transcriptional errors.

## 2015-09-24 DIAGNOSIS — I5022 Chronic systolic (congestive) heart failure: Secondary | ICD-10-CM | POA: Insufficient documentation

## 2015-10-26 ENCOUNTER — Telehealth: Payer: Self-pay | Admitting: *Deleted

## 2015-10-26 ENCOUNTER — Encounter: Payer: Self-pay | Admitting: *Deleted

## 2015-10-26 DIAGNOSIS — I472 Ventricular tachycardia, unspecified: Secondary | ICD-10-CM

## 2015-10-26 NOTE — Addendum Note (Signed)
Addended by: Shiela Mayer on: 10/26/2015 04:44 PM   Modules accepted: Orders

## 2015-10-26 NOTE — Telephone Encounter (Signed)
Spoke Dr.Klein about patient's episode from 7/31. Dr.Klein recommended labs (Mag/BMP) and f/u with him first available.  Will order labs to be done in [place].  Will defer scheduling to [facility].  Patient informed about Dr.Klein's recommendations.

## 2015-10-26 NOTE — Telephone Encounter (Signed)
Spoke to patient regarding ICD shock for polymorphic VT yesterday (7/31) @ 1428. Patient states that he was sitting down talking on the phone when all of a sudden he felt very dizzy and received the shock. Patient denied any other sx's of CP, syncope, or ShOB. Patient states that he had taken his Coreg that morning. He stated that he only missed one dose of Coreg, on Saturday night, when he went to a family reunion. Patient feels fine at present.  Will inform Dr.Klein about patient's episode and call patient with any further recommendations.  Patient aware of driving restriction x 6 months.

## 2015-10-27 ENCOUNTER — Other Ambulatory Visit (HOSPITAL_COMMUNITY)
Admission: RE | Admit: 2015-10-27 | Discharge: 2015-10-27 | Disposition: A | Discharge status: Home / Self Care | Payer: Medicare Other | Source: Ambulatory Visit | Attending: Internal Medicine | Admitting: Internal Medicine

## 2015-10-27 DIAGNOSIS — I472 Ventricular tachycardia: Secondary | ICD-10-CM | POA: Diagnosis present

## 2015-10-27 LAB — MAGNESIUM: MAGNESIUM: 1.9 mg/dL (ref 1.7–2.4)

## 2015-10-27 LAB — BASIC METABOLIC PANEL
ANION GAP: 5 (ref 5–15)
BUN: 20 mg/dL (ref 6–20)
CHLORIDE: 102 mmol/L (ref 101–111)
CO2: 32 mmol/L (ref 22–32)
CREATININE: 1.22 mg/dL (ref 0.61–1.24)
Calcium: 8.9 mg/dL (ref 8.9–10.3)
GFR calc non Af Amer: 56 mL/min — ABNORMAL LOW (ref >60)
Glucose, Bld: 138 mg/dL — ABNORMAL HIGH (ref 65–99)
Potassium: 4.1 mmol/L (ref 3.5–5.1)
Sodium: 139 mmol/L (ref 135–145)

## 2015-10-27 NOTE — Progress Notes (Signed)
This encounter was created in error - please disregard.

## 2015-10-29 ENCOUNTER — Encounter: Payer: Self-pay | Admitting: Internal Medicine

## 2015-11-01 ENCOUNTER — Ambulatory Visit: Payer: Medicare Other | Admitting: Internal Medicine

## 2015-11-02 ENCOUNTER — Encounter: Payer: Self-pay | Admitting: Internal Medicine

## 2015-11-02 ENCOUNTER — Ambulatory Visit (INDEPENDENT_AMBULATORY_CARE_PROVIDER_SITE_OTHER): Payer: Medicare Other | Admitting: Internal Medicine

## 2015-11-02 VITALS — BP 126/74 | HR 53 | Ht 71.5 in | Wt 309.6 lb

## 2015-11-02 DIAGNOSIS — I429 Cardiomyopathy, unspecified: Secondary | ICD-10-CM

## 2015-11-02 DIAGNOSIS — I5022 Chronic systolic (congestive) heart failure: Secondary | ICD-10-CM | POA: Diagnosis not present

## 2015-11-02 DIAGNOSIS — I428 Other cardiomyopathies: Secondary | ICD-10-CM

## 2015-11-02 DIAGNOSIS — I469 Cardiac arrest, cause unspecified: Secondary | ICD-10-CM

## 2015-11-02 DIAGNOSIS — Z95810 Presence of automatic (implantable) cardiac defibrillator: Secondary | ICD-10-CM | POA: Diagnosis not present

## 2015-11-02 MED ORDER — CARVEDILOL 12.5 MG PO TABS: 12.5000 mg | ORAL_TABLET | Freq: Two times a day (BID) | ORAL | 3 refills | Status: DC

## 2015-11-02 NOTE — Progress Notes (Signed)
Patient Care Team: Lucianne Lei, MD as PCP - General (Family Medicine)   HPI  Rickey Rice is a 75 y.o. male Seen in follow-up for CRT-D implantation for aborted cardiac arrest in the setting of nonischemic cardiac myopathy and left bundle branch block.  Because of a short RV LV interval, it was ELECTED NOT to activate the LV leads     He has modest shortness of breath. He struggles with some peripheral edema. He has had no interval chest pain. Dr. Debara Pickett interval notes have been reviewed  He suffered appropriate shock with syncope   he was sitting in a chair and without warning. His functional status has been relatively stable although limited by bursitis in his hip. There has been, weight gain.     Catheterization had demonstrated an ejection fraction of 40%  Coronaries were clean      Past Medical History:  Diagnosis Date  . Arthritis   . Cardiac arrest ([place]) 05/2014   Primary VF arrest with successful resuscitation and s/p ICD implant  . Diabetes mellitus without complication ([place])    type 2  . Dyslipidemia   . History of nuclear stress test 04/2012   lexiscan - 2 day protocol; low risk study, evidence of inferrior & apical scar but no ischemia   . Hypertension   . Hypothyroidism   . ICD (implantable cardioverter-defibrillator) in place 05/2014   Dr. [name]  . LBBB (left bundle branch block)   . Morbid obesity ([place])   . NICM (nonischemic cardiomyopathy) ([place])   . Prostate enlargement   . Shortness of breath    with exertion     Past Surgical History:  Procedure Laterality Date  . BI-VENTRICULAR IMPLANTABLE CARDIOVERTER DEFIBRILLATOR N/A 06/18/2014   Procedure: BI-VENTRICULAR IMPLANTABLE CARDIOVERTER DEFIBRILLATOR  (CRT-D);  Surgeon: Deboraha Sprang, MD;  Location: [facility] CATH LAB;  Service: Cardiovascular;  Laterality: N/A;  . COLONOSCOPY W/ BIOPSIES    . [place]  . KNEE ARTHROSCOPY Left   . LEFT HEART CATHETERIZATION WITH CORONARY ANGIOGRAM N/A  06/12/2014   Procedure: LEFT HEART CATHETERIZATION WITH CORONARY ANGIOGRAM;  Surgeon: Leonie Man, MD;  Location: [facility] CATH LAB;  Service: Cardiovascular;  Laterality: N/A;  . [place]  . TOTAL KNEE ARTHROPLASTY Right 02/05/2013   Dr Mayer Camel  . TOTAL KNEE ARTHROPLASTY Right 02/05/2013   Procedure: TOTAL KNEE ARTHROPLASTY;  Surgeon: Kerin Salen, MD;  Location: [place];  Service: Orthopedics;  Laterality: Right;  . TOTAL KNEE ARTHROPLASTY Left 01/09/2014   DR Mayer Camel  . TOTAL KNEE ARTHROPLASTY Left 01/19/2014   Procedure: LEFT TOTAL KNEE ARTHROPLASTY;  Surgeon: Kerin Salen, MD;  Location: [place];  Service: Orthopedics;  Laterality: Left;    Current Outpatient Prescriptions  Medication Sig Dispense Refill  . amoxicillin (AMOXIL) 500 MG capsule Take 500 mg by mouth as directed. Prior to dental/periodontal appointments    . aspirin EC 81 MG tablet Take 81 mg by mouth daily.    . carvedilol (COREG) 6.25 MG tablet TAKE 1 TABLET(6.25 MG) BY MOUTH TWICE DAILY WITH A MEAL 60 tablet 5  . furosemide (LASIX) 40 MG tablet TAKE 1 TABLET(40 MG) BY MOUTH DAILY 30 tablet 11  . glimepiride (AMARYL) 4 MG tablet Take 8 mg by mouth daily before breakfast.     . levothyroxine (SYNTHROID, LEVOTHROID) 75 MCG tablet Take 75 mcg by mouth daily before breakfast.    . lisinopril (PRINIVIL,ZESTRIL) 10 MG tablet TAKE 1  TABLET(10 MG) BY MOUTH DAILY 30 tablet 10  . LUTEIN ESTERS PO Take 1 capsule by mouth every other day.     . rosuvastatin (CRESTOR) 10 MG tablet Take 1 tablet (10 mg total) by mouth daily at 6 PM. 30 tablet 0  . saxagliptin HCl (ONGLYZA) 5 MG TABS tablet Take 5 mg by mouth daily.    . tamsulosin (FLOMAX) 0.4 MG CAPS capsule Take 0.4 mg by mouth every other day.      No current facility-administered medications for this visit.     Allergies  Allergen Reactions  . Bee Venom Swelling    Swelling on lips and tongue  . Demerol [Meperidine]   . Pioglitazone     Lips swelling  . Shrimp  [Shellfish Allergy]     rash  . Sulfa Antibiotics       Review of Systems negative except from HPI and PMH  Physical Exam BP 126/74 (BP Location: Right Arm, Patient Position: Sitting, Cuff Size: Large)   Pulse (!) 53   Ht 5' 11.5" (1.816 m)   Wt (!) 309 lb 9.6 oz (140.4 kg)   BMI 42.58 kg/m  Well developed and Morbidly obese  in no acute distress HENT normal E scleral and icterus clear Neck Supple JVP flat; carotids brisk and full Clear to ausculation Device pocket well healed; without hematoma or erythema.  There is no tethering   Regular rate and rhythm, no murmurs gallops or rub Soft with active bowel sounds No clubbing cyanosis  1-2+ Edema Alert and oriented, grossly normal motor and sensory function Skin Warm and Dry    Assessment and  Plan  NICM  Aborted cardiac Arrest  VT-polymorphic /syncope   CRT-D implant  CHF chronic-systolic  Lightheadedness     No clear trigger for his recurrent VT VF. We will check LV function. Electrolytes were okay. We will leave the device programmed as it is giving and manipulations that were undertaken 2/17 with changes of AV intervals to allow for upright QRS in lead V1. There is some concern that it could be proarrhythmic. He has an underlying left bundle branch block and does have intrinsic conduction available.  He is advised about driving  We have discussed the physiology of orthostatic intolerance in the context of his hypertension and diabetes; while it has not objectively demonstrable today I suspect this is still the underlining problem.

## 2015-11-02 NOTE — Patient Instructions (Signed)
Medication Instructions: - Your physician has recommended you make the following change in your medication:  1) Increase coreg (carvedilol) to 12.5 mg one tablet by mouth twice daily  Labwork: - none  Procedures/Testing: - Your physician has requested that you have an echocardiogram. Echocardiography is a painless test that uses sound waves to create images of your heart. It provides your doctor with information about the size and shape of your heart and how well your heart's chambers and valves are working. This procedure takes approximately one hour. There are no restrictions for this procedure.  Follow-Up: - Your physician recommends that you schedule a follow-up appointment in: 3 months with Chanetta Marshall, NP for Dr. [name].  Any Additional Special Instructions Will Be Listed Below (If Applicable).     If you need a refill on your cardiac medications before your next appointment, please call your pharmacy.

## 2015-11-04 LAB — CUP PACEART INCLINIC DEVICE CHECK
Battery Remaining Longevity: 70.8
Brady Statistic RA Percent Paced: 0 %
Date Time Interrogation Session: 20170808182503
HighPow Impedance: 56.25 Ohm
Implantable Lead Implant Date: 20160324
Implantable Lead Implant Date: 20160324
Implantable Lead Location: 753859
Implantable Lead Model: 7122
Lead Channel Pacing Threshold Amplitude: 1 V
Lead Channel Pacing Threshold Amplitude: 1 V
Lead Channel Pacing Threshold Pulse Width: 0.5 ms
Lead Channel Pacing Threshold Pulse Width: 0.5 ms
Lead Channel Pacing Threshold Pulse Width: 0.5 ms
Lead Channel Setting Pacing Amplitude: 2 V
Lead Channel Setting Sensing Sensitivity: 0.5 mV
MDC IDC LEAD IMPLANT DT: 20160324
MDC IDC LEAD LOCATION: 753858
MDC IDC LEAD LOCATION: 753860
MDC IDC MSMT LEADCHNL LV IMPEDANCE VALUE: 862.5 Ohm
MDC IDC MSMT LEADCHNL RA IMPEDANCE VALUE: 387.5 Ohm
MDC IDC MSMT LEADCHNL RA SENSING INTR AMPL: 2.9 mV
MDC IDC MSMT LEADCHNL RV IMPEDANCE VALUE: 512.5 Ohm
MDC IDC MSMT LEADCHNL RV PACING THRESHOLD AMPLITUDE: 0.75 V
MDC IDC MSMT LEADCHNL RV PACING THRESHOLD AMPLITUDE: 0.75 V
MDC IDC MSMT LEADCHNL RV PACING THRESHOLD PULSEWIDTH: 0.5 ms
MDC IDC MSMT LEADCHNL RV SENSING INTR AMPL: 12 mV
MDC IDC SET LEADCHNL LV PACING AMPLITUDE: 1.75 V
MDC IDC SET LEADCHNL LV PACING PULSEWIDTH: 1 ms
MDC IDC SET LEADCHNL RV PACING AMPLITUDE: 2.5 V
MDC IDC SET LEADCHNL RV PACING PULSEWIDTH: 0.5 ms
MDC IDC STAT BRADY RV PERCENT PACED: 98 %
Pulse Gen Serial Number: 7240350

## 2015-11-08 ENCOUNTER — Ambulatory Visit (INDEPENDENT_AMBULATORY_CARE_PROVIDER_SITE_OTHER): Payer: Medicare Other | Admitting: *Deleted

## 2015-11-08 DIAGNOSIS — I5022 Chronic systolic (congestive) heart failure: Secondary | ICD-10-CM

## 2015-11-08 DIAGNOSIS — I429 Cardiomyopathy, unspecified: Secondary | ICD-10-CM | POA: Diagnosis not present

## 2015-11-08 DIAGNOSIS — Z95810 Presence of automatic (implantable) cardiac defibrillator: Secondary | ICD-10-CM

## 2015-11-08 DIAGNOSIS — I428 Other cardiomyopathies: Secondary | ICD-10-CM

## 2015-11-08 NOTE — Progress Notes (Signed)
Remote ICD transmission.   

## 2015-11-10 ENCOUNTER — Encounter: Payer: Self-pay | Admitting: Cardiology

## 2015-11-11 ENCOUNTER — Other Ambulatory Visit: Payer: Self-pay

## 2015-11-11 ENCOUNTER — Ambulatory Visit (HOSPITAL_COMMUNITY): Payer: Medicare Other | Attending: Cardiovascular Disease

## 2015-11-11 DIAGNOSIS — I340 Nonrheumatic mitral (valve) insufficiency: Secondary | ICD-10-CM | POA: Insufficient documentation

## 2015-11-11 DIAGNOSIS — R29898 Other symptoms and signs involving the musculoskeletal system: Secondary | ICD-10-CM | POA: Diagnosis not present

## 2015-11-11 DIAGNOSIS — I469 Cardiac arrest, cause unspecified: Secondary | ICD-10-CM

## 2015-11-11 DIAGNOSIS — I429 Cardiomyopathy, unspecified: Secondary | ICD-10-CM | POA: Insufficient documentation

## 2015-11-11 DIAGNOSIS — I5022 Chronic systolic (congestive) heart failure: Secondary | ICD-10-CM | POA: Diagnosis not present

## 2015-11-11 DIAGNOSIS — E785 Hyperlipidemia, unspecified: Secondary | ICD-10-CM | POA: Diagnosis not present

## 2015-11-11 DIAGNOSIS — I428 Other cardiomyopathies: Secondary | ICD-10-CM

## 2015-11-11 DIAGNOSIS — I110 Hypertensive heart disease with heart failure: Secondary | ICD-10-CM | POA: Insufficient documentation

## 2015-11-12 ENCOUNTER — Encounter: Payer: Self-pay | Admitting: Internal Medicine

## 2015-11-12 ENCOUNTER — Ambulatory Visit (INDEPENDENT_AMBULATORY_CARE_PROVIDER_SITE_OTHER): Payer: Medicare Other | Admitting: Internal Medicine

## 2015-11-12 VITALS — BP 96/68 | HR 69 | Ht 71.5 in | Wt 301.6 lb

## 2015-11-12 DIAGNOSIS — Z95810 Presence of automatic (implantable) cardiac defibrillator: Secondary | ICD-10-CM

## 2015-11-12 DIAGNOSIS — I447 Left bundle-branch block, unspecified: Secondary | ICD-10-CM | POA: Diagnosis not present

## 2015-11-12 DIAGNOSIS — I428 Other cardiomyopathies: Secondary | ICD-10-CM

## 2015-11-12 DIAGNOSIS — I472 Ventricular tachycardia, unspecified: Secondary | ICD-10-CM | POA: Insufficient documentation

## 2015-11-12 DIAGNOSIS — I429 Cardiomyopathy, unspecified: Secondary | ICD-10-CM | POA: Diagnosis not present

## 2015-11-12 LAB — CUP PACEART REMOTE DEVICE CHECK
Battery Remaining Longevity: 75 mo
Brady Statistic AP VS Percent: 1 %
Brady Statistic AS VS Percent: 1 %
HIGH POWER IMPEDANCE MEASURED VALUE: 60 Ohm
HighPow Impedance: 60 Ohm
Implantable Lead Implant Date: 20160324
Implantable Lead Location: 753858
Implantable Lead Location: 753859
Implantable Lead Location: 753860
Lead Channel Impedance Value: 790 Ohm
Lead Channel Pacing Threshold Amplitude: 1.75 V
Lead Channel Pacing Threshold Pulse Width: 1.2 ms
Lead Channel Sensing Intrinsic Amplitude: 2.6 mV
Lead Channel Setting Pacing Amplitude: 2.5 V
Lead Channel Setting Pacing Pulse Width: 0.5 ms
Lead Channel Setting Sensing Sensitivity: 0.5 mV
MDC IDC LEAD IMPLANT DT: 20160324
MDC IDC LEAD IMPLANT DT: 20160324
MDC IDC LEAD MODEL: 7122
MDC IDC MSMT BATTERY REMAINING PERCENTAGE: 81 %
MDC IDC MSMT BATTERY VOLTAGE: 3.01 V
MDC IDC MSMT LEADCHNL RA IMPEDANCE VALUE: 390 Ohm
MDC IDC MSMT LEADCHNL RA PACING THRESHOLD AMPLITUDE: 1 V
MDC IDC MSMT LEADCHNL RA PACING THRESHOLD PULSEWIDTH: 0.5 ms
MDC IDC MSMT LEADCHNL RV IMPEDANCE VALUE: 510 Ohm
MDC IDC MSMT LEADCHNL RV PACING THRESHOLD AMPLITUDE: 0.75 V
MDC IDC MSMT LEADCHNL RV PACING THRESHOLD PULSEWIDTH: 0.5 ms
MDC IDC MSMT LEADCHNL RV SENSING INTR AMPL: 12 mV
MDC IDC PG SERIAL: 7240350
MDC IDC SESS DTM: 20170814105943
MDC IDC SET LEADCHNL LV PACING AMPLITUDE: 1.75 V
MDC IDC SET LEADCHNL LV PACING PULSEWIDTH: 1 ms
MDC IDC SET LEADCHNL RA PACING AMPLITUDE: 2 V
MDC IDC STAT BRADY AP VP PERCENT: 1 %
MDC IDC STAT BRADY AS VP PERCENT: 98 %
MDC IDC STAT BRADY RA PERCENT PACED: 1 %

## 2015-11-12 NOTE — Patient Instructions (Signed)
Your physician wants you to follow-up in: [place] with Dr. Debara Pickett. You will receive a reminder letter in the mail two months in advance. If you don't receive a letter, please call our office to schedule the follow-up appointment.

## 2015-11-12 NOTE — Progress Notes (Signed)
OFFICE NOTE  Chief Complaint:  Follow-up  Primary Care Physician: Elyn Peers, MD  HPI:  Rickey Rice  is a 75 year old gentleman with a history of diabetes, hypertension, hypothyroidism, and morbid obesity. In 2010 he had complaints of chest pain and underwent stress testing with [facility] which was a 2-day nuclear stress test and was apparently negative. Recently he underwent a colonoscopy and a preoperative EKG was abnormal. I unfortunately do not have that EKG to review, but I did review the EKG from your office on April 09, 2012 which showed a borderline intraventricular conduction delay, a sinus rhythm at 66, and poor R-wave progression. Today in the office he has an abnormal EKG demonstrating a left bundle branch block with a QRS duration of 168 msec, heart rate of 78 in sinus. His only symptoms are shortness of breath with exertion. He underwent evaluation of his left bundle branch block in February 2014. This is a 2 day nuclear stress test which was negative for ischemia. There was a small inferior and apical defect which could represent scar versus artifact. EF was mildly reduced at 49%.    Rickey Rice returns today for followup. He underwent knee replacement surgery as we had authorized him to do. He is tolerated surgery without any complications. He reports that he is recovering fairly well and is more mobile than he had been in the past. He started to do some exercises at the [facility] and also this will translate into weight loss. Blood pressure has been stable he denies any chest pain.  He had blood work in January from his primary care provider which showed a total cholesterol of 128, HDL 51, triglycerides 53 and LDL 65. His hemoglobin A1c was 6.2.  Rickey Rice is now contemplating lethargy. Unfortunately has not been able to walk enough to lose weight in fact has gained some weight. 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 He  feels like it's time to retire work on his generalized health. I would tend to agree with this.  I saw Rickey Rice back in the office today. He recently followed up from the hospital after having a sudden cardiac arrest. Unfortunately he was revived and is now status post AICD. He was to have CRT-D therapy, however there was difficulty with the LV lead, therefore he only has a single ventricle pacing with defibrillator functions. He is tolerating this well and had a small episode with fever postoperatively, but this was thought to be due to possibly a UTI. There is no evidence of pacemaker site fluctuance or swelling. Overall he feels he is doing well. He started exercising is managed to lose some weight. He is less short of breath and is more active.  Rickey Rice returns today for follow-up. Overall he is doing exceedingly well. He's managed to lose about 10 pounds. He is now below 300 pounds. His shortness of breath is improved some and he is ambulating better on his knees. He's had no device problems including no firings. He denies any chest pain or worsening shortness of breath and has not had any presyncopal or syncopal episodes.  Rickey Rice returns today for follow-up. Unfortunately he's gained some of his weight back. He denies a chest pain or worsening shortness of breath. He does not have any swelling. His last EF was as mentioned 40% in March 2016. EKG shows left bundle branch block. He scheduled to see Dr. [name] back for device interrogation next  month.  11/12/2015  Rickey Rice was seen today in follow-up. Unfortunately he was recently seen for some weight gain and fatigue by Lovett Sox, PA-C. She felt that he might be getting some extra fluid and recommended a short trial of increased Lasix. Weight is actually been fairly stable now at 300-301. Around that time he was found to have a UTI was treated for that and his symptoms improved. He was also noted to have problems with infection and  underwent multiple rounds of antibiotics. This led to problems with diarrhea or digestive problems and ultimately he went on probiotics. Recently, though he was talking on the phone and had an episode of transient global amnesia shortly after his wife left the house. He called her back and she came home and noted that he may have had an episode with his defibrillator. This was subsequently confirmed to be VT and he underwent a shock for that. He is seen Dr. [name] who increased his carvedilol to 12-1/2 mg twice a day. Blood pressure is actually a little low today but he reports being asymptomatic with that.  PMHx:  Past Medical History:  Diagnosis Date  . Arthritis   . Cardiac arrest ([place]) 05/2014   Primary VF arrest with successful resuscitation and s/p ICD implant  . Diabetes mellitus without complication ([place])    type 2  . Dyslipidemia   . History of nuclear stress test 04/2012   lexiscan - 2 day protocol; low risk study, evidence of inferrior & apical scar but no ischemia   . Hypertension   . Hypothyroidism   . ICD (implantable cardioverter-defibrillator) in place 05/2014   Dr. [name]  . LBBB (left bundle branch block)   . Morbid obesity ([place])   . NICM (nonischemic cardiomyopathy) ([place])   . Prostate enlargement   . Shortness of breath    with exertion     Past Surgical History:  Procedure Laterality Date  . BI-VENTRICULAR IMPLANTABLE CARDIOVERTER DEFIBRILLATOR N/A 06/18/2014   Procedure: BI-VENTRICULAR IMPLANTABLE CARDIOVERTER DEFIBRILLATOR  (CRT-D);  Surgeon: Deboraha Sprang, MD;  Location: [facility] CATH LAB;  Service: Cardiovascular;  Laterality: N/A;  . COLONOSCOPY W/ BIOPSIES    . [place]  . KNEE ARTHROSCOPY Left   . LEFT HEART CATHETERIZATION WITH CORONARY ANGIOGRAM N/A 06/12/2014   Procedure: LEFT HEART CATHETERIZATION WITH CORONARY ANGIOGRAM;  Surgeon: Leonie Man, MD;  Location: [facility] CATH LAB;  Service: Cardiovascular;  Laterality: N/A;  . [place]  . TOTAL  KNEE ARTHROPLASTY Right 02/05/2013   Dr Mayer Camel  . TOTAL KNEE ARTHROPLASTY Right 02/05/2013   Procedure: TOTAL KNEE ARTHROPLASTY;  Surgeon: Kerin Salen, MD;  Location: [place];  Service: Orthopedics;  Laterality: Right;  . TOTAL KNEE ARTHROPLASTY Left 01/09/2014   DR Mayer Camel  . TOTAL KNEE ARTHROPLASTY Left 01/19/2014   Procedure: LEFT TOTAL KNEE ARTHROPLASTY;  Surgeon: Kerin Salen, MD;  Location: [place];  Service: Orthopedics;  Laterality: Left;    FAMHx:  Family History  Problem Relation Age of Onset  . Cancer Mother   . Kidney disease Brother   . Hypertension Sister     SOCHx:   reports that he has never smoked. He has never used smokeless tobacco. He reports that he does not drink alcohol or use drugs.  ALLERGIES:  Allergies  Allergen Reactions  . Bee Venom Swelling    Swelling on lips and tongue  . Demerol [Meperidine]   . Pioglitazone     Lips swelling  .  Shrimp [Shellfish Allergy]     rash  . Sulfa Antibiotics     ROS: Pertinent items noted in HPI and remainder of comprehensive ROS otherwise negative.  HOME MEDS: Current Outpatient Prescriptions  Medication Sig Dispense Refill  . amoxicillin (AMOXIL) 500 MG capsule Take 500 mg by mouth as directed. Prior to dental/periodontal appointments    . aspirin EC 81 MG tablet Take 81 mg by mouth daily.    . carvedilol (COREG) 12.5 MG tablet Take 1 tablet (12.5 mg total) by mouth 2 (two) times daily. 180 tablet 3  . furosemide (LASIX) 40 MG tablet TAKE 1 TABLET(40 MG) BY MOUTH DAILY 30 tablet 11  . glimepiride (AMARYL) 4 MG tablet Take 8 mg by mouth daily before breakfast.     . levothyroxine (SYNTHROID, LEVOTHROID) 75 MCG tablet Take 75 mcg by mouth daily before breakfast.    . lisinopril (PRINIVIL,ZESTRIL) 10 MG tablet TAKE 1 TABLET(10 MG) BY MOUTH DAILY 30 tablet 10  . LUTEIN ESTERS PO Take 1 capsule by mouth every other day.     . rosuvastatin (CRESTOR) 10 MG tablet Take 1 tablet (10 mg total) by mouth daily at 6 PM. 30  tablet 0  . saxagliptin HCl (ONGLYZA) 5 MG TABS tablet Take 5 mg by mouth daily.    . tamsulosin (FLOMAX) 0.4 MG CAPS capsule Take 0.4 mg by mouth every other day.      No current facility-administered medications for this visit.     LABS/IMAGING: No results found for this or any previous visit (from the past 48 hour(s)). No results found.  VITALS: BP 96/68 (BP Location: Right Arm, Patient Position: Sitting, Cuff Size: Large)   Pulse 69   Ht 5' 11.5" (1.816 m)   Wt (!) 301 lb 9.6 oz (136.8 kg)   SpO2 99%   BMI 41.48 kg/m   EXAM: General appearance: alert, no distress and morbidly obese Neck: no carotid bruit, no JVD and thyroid not enlarged, symmetric, no tenderness/mass/nodules Lungs: clear to auscultation bilaterally and AICD in left upper chest Heart: regular rate and rhythm, S1, S2 normal, no murmur, click, rub or gallop Abdomen: soft, non-tender; bowel sounds normal; no masses,  no organomegaly and Morbidly obese Extremities: extremities normal, atraumatic, no cyanosis or edema Pulses: 2+ and symmetric Skin: Skin color, texture, turgor normal. No rashes or lesions  EKG: Deferred  ASSESSMENT: 1. Status post aborted sudden cardiac death-status post placement of a [employer] Jude Bi-V AICD (LV lead is disabled due to dysfunction) 2. Recent VT/VF with appropriate AICD shock 3. Negative nuclear stress test for ischemia, fixed inferoapical defect suggesting possible scar versus artifact, EF 49% 3.   Diabetes type 2 on oral medications - A1c 5.8 4.   Hypertension-well controlled 5.   Dyslipidemia on statin - well controlled 6.   Ischemic cardiomyopathy-EF 30-35%  PLAN: 1.   Mr. Kowalchuk had a recent AICD shock which was appropriate. He had an increase in his carvedilol. Blood pressure is low normal now however he seems to be tolerating it. A repeat echocardiogram was performed which shows persistent EF of 30-35%. He is also on lisinopril. Blood pressure will not allow up titration  of those medicines. One option may be to switch his lisinopril over to [facility], however I'm concerned about orthostatic symptoms which she's had and his ability to tolerate Entresto. We'll have to see if he continues to tolerate his current medications with his low normal blood pressure.  Follow-up in 3 months.  Pixie Casino,  MD, [facility] Attending Cardiologist [place] 11/12/2015, 3:41 PM

## 2015-11-24 ENCOUNTER — Encounter: Payer: Self-pay | Admitting: Cardiology

## 2015-12-17 ENCOUNTER — Telehealth: Payer: Self-pay | Admitting: *Deleted

## 2015-12-17 NOTE — Telephone Encounter (Signed)
No concerns with periodontal surgery and local anesthesia.  Dr. Lemmie Evens

## 2015-12-17 NOTE — Telephone Encounter (Signed)
Will forward this note to the number provided. 

## 2015-12-17 NOTE — Telephone Encounter (Signed)
Dr Geralynn Ochs is asking if there is any contraindications to periodontal surgery with local anesthesia. Will forward for dr hilty's review and advise

## 2015-12-30 ENCOUNTER — Telehealth: Payer: Self-pay

## 2015-12-30 NOTE — Telephone Encounter (Signed)
Referred to ICM clinic by Debroah Loop, Device RN/Dr [name].  Attempted ICM intro call to patient and left message for return call.   Patient has office appointment with Chanetta Marshall, NP for defib check on 02/02/2016.  [place] has show fluid accumulation July and August 2017.

## 2016-02-01 ENCOUNTER — Encounter: Payer: Self-pay | Admitting: Nurse Practitioner

## 2016-02-01 NOTE — Progress Notes (Signed)
Electrophysiology Office Note Date: 02/02/2016  ID:  Rickey Rice, DOB Feb 28, 1941, MRN [id number]  PCP: Elyn Peers, MD Primary Cardiologist: Debara Pickett Electrophysiologist: [name]  CC: Routine ICD follow-up  Rickey Rice is a 75 y.o. male seen today for Dr [name].  He presents today for routine electrophysiology followup.  Since last being seen in our clinic, the patient reports doing reasonably well. He denies chest pain, palpitations, dyspnea, PND, orthopnea, nausea, vomiting, dizziness, syncope, edema, weight gain, or early satiety.  He has not had ICD shocks.   Device History: STJ CRTD implanted 2016 for aborted cardiac arrest, NICM, CHF History of appropriate therapy: Yes History of AAD therapy: No   Past Medical History:  Diagnosis Date  . Arthritis   . Cardiac arrest ([place]) 05/2014   Primary VF arrest with successful resuscitation and s/p ICD implant  . Diabetes mellitus without complication ([place])    type 2  . Dyslipidemia   . History of nuclear stress test 04/2012   lexiscan - 2 day protocol; low risk study, evidence of inferrior & apical scar but no ischemia   . Hypertension   . Hypothyroidism   . LBBB (left bundle branch block)   . Morbid obesity ([place])   . NICM (nonischemic cardiomyopathy) ([place])   . Prostate enlargement    Past Surgical History:  Procedure Laterality Date  . BI-VENTRICULAR IMPLANTABLE CARDIOVERTER DEFIBRILLATOR N/A 06/18/2014   STJ CRTD implanted by Dr [name]  . COLONOSCOPY W/ BIOPSIES    . [place]  . KNEE ARTHROSCOPY Left   . LEFT HEART CATHETERIZATION WITH CORONARY ANGIOGRAM N/A 06/12/2014   Procedure: LEFT HEART CATHETERIZATION WITH CORONARY ANGIOGRAM;  Surgeon: Leonie Man, MD;  Location[facility] C[facility] LAB;  Service: Cardiovascular;  Laterality: N/A;  . [place]  . TOTAL KNEE ARTHROPLASTY Right 02/05/2013   Procedure: TOTAL KNEE ARTHROPLASTY;  Surgeon: Kerin Salen, MD;  Location: [place];  Service: Orthopedics;   Laterality: Right;  . TOTAL KNEE ARTHROPLASTY Left 01/19/2014   Procedure: LEFT TOTAL KNEE ARTHROPLASTY;  Surgeon: Kerin Salen, MD;  Location: [place];  Service: Orthopedics;  Laterality: Left;    Current Outpatient Prescriptions  Medication Sig Dispense Refill  . amoxicillin (AMOXIL) 500 MG capsule Take 500 mg by mouth as directed. Prior to dental/periodontal appointments    . aspirin EC 81 MG tablet Take 81 mg by mouth daily.    . furosemide (LASIX) 40 MG tablet TAKE 1 TABLET(40 MG) BY MOUTH DAILY 30 tablet 11  . glimepiride (AMARYL) 4 MG tablet Take 8 mg by mouth daily before breakfast.     . levothyroxine (SYNTHROID, LEVOTHROID) 75 MCG tablet Take 75 mcg by mouth daily before breakfast.    . lisinopril (PRINIVIL,ZESTRIL) 10 MG tablet TAKE 1 TABLET(10 MG) BY MOUTH DAILY 30 tablet 10  . LUTEIN ESTERS PO Take 2 capsules by mouth every other day.     . rosuvastatin (CRESTOR) 10 MG tablet Take 1 tablet (10 mg total) by mouth daily at 6 PM. 30 tablet 0  . saxagliptin HCl (ONGLYZA) 5 MG TABS tablet Take 5 mg by mouth daily.    . tamsulosin (FLOMAX) 0.4 MG CAPS capsule Take 0.4 mg by mouth every other day.     . carvedilol (COREG) 12.5 MG tablet Take 1 tablet (12.5 mg total) by mouth 2 (two) times daily. 180 tablet 3   No current facility-administered medications for this visit.     Allergies:   Bee  venom; Demerol [meperidine]; Pioglitazone; Shrimp [shellfish allergy]; and Sulfa antibiotics   Social History: Social History   Social History  . Marital status: Married    Spouse name: N/A  . Number of children: 2  . Years of education: N/A   Occupational History  . owner of photography shop    Social History Main Topics  . Smoking status: Never Smoker  . Smokeless tobacco: Never Used  . Alcohol use No  . Drug use: No  . Sexual activity: Not on file   Other Topics Concern  . Not on file   Social History Narrative  . No narrative on file    Family History: Family History    Problem Relation Age of Onset  . Cancer Mother   . Kidney disease Brother   . Hypertension Sister     Review of Systems: All other systems reviewed and are otherwise negative except as noted above.   Physical Exam: VS:  BP 122/80 (BP Location: Left Arm, Patient Position: Sitting, Cuff Size: Large)   Pulse 64   Ht 5' 11.5" (1.816 m)   Wt (!) 312 lb 9.6 oz (141.8 kg)   SpO2 98%   BMI 42.99 kg/m  , BMI Body mass index is 42.99 kg/m.  GEN- The patient is obese appearing, alert and oriented x 3 today.   HEENT: normocephalic, atraumatic; sclera clear, conjunctiva pink; hearing intact; oropharynx clear; neck supple  Lungs- Clear to ausculation bilaterally, normal work of breathing.  No wheezes, rales, rhonchi Heart- Regular rate and rhythm (paced) GI- soft, non-tender, non-distended, bowel sounds present  Extremities- no clubbing, cyanosis, +depedent BLE edema MS- no significant deformity or atrophy Skin- warm and dry, no rash or lesion; ICD pocket well healed Psych- euthymic mood, full affect Neuro- strength and sensation are intact  ICD interrogation- reviewed in detail today,  See PACEART report  EKG:  EKG is not ordered today.  Recent Labs: 10/27/2015: BUN 20; Creatinine, Ser 1.22; Magnesium 1.9; Potassium 4.1; Sodium 139   Wt Readings from Last 3 Encounters:  02/02/16 (!) 312 lb 9.6 oz (141.8 kg)  11/12/15 (!) 301 lb 9.6 oz (136.8 kg)  11/02/15 (!) 309 lb 9.6 oz (140.4 kg)     Other studies Reviewed: Additional studies/ records that were reviewed today include: Dr [name] and Dr [facility] office notes  Assessment and Plan:  1.  Chronic systolic dysfunction euvolemic today Stable on an appropriate medical regimen Normal ICD function See Pace Art report Threshold 1.75V@1 .60msec today. Device was programmed 1.75V@1msec , reprogrammed pulse width to 1.41msec today.   2.  Ventricular tachycardia No recurrence since last seen by Dr [name] Keep K >3.9, Mg >1.8 No driving x6  months from last episode  3.  HTN Stable No change required today  4.  Morbid obesity Body mass index is 42.99 kg/m. Weight loss encouraged  He is working on weight loss efforts   Current medicines are reviewed at length with the patient today.   The patient does not have concerns regarding his medicines.  The following changes were made today:  none  Labs/ tests ordered today include: BMET, Mg, BNP No orders of the defined types were placed in this encounter.    Disposition:   Follow up with ICM clinic, [place], Dr Debara Pickett as scheduled, Dr [name] 6 months     Signed, Chanetta Marshall, NP 02/02/2016 10:15 AM  [address] [place]  [id number] [phone] (office) [phone] (fax

## 2016-02-02 ENCOUNTER — Ambulatory Visit (INDEPENDENT_AMBULATORY_CARE_PROVIDER_SITE_OTHER): Payer: Medicare Other | Admitting: Nurse Practitioner

## 2016-02-02 ENCOUNTER — Encounter: Payer: Self-pay | Admitting: Nurse Practitioner

## 2016-02-02 VITALS — BP 122/80 | HR 64 | Ht 71.5 in | Wt 312.6 lb

## 2016-02-02 DIAGNOSIS — I10 Essential (primary) hypertension: Secondary | ICD-10-CM

## 2016-02-02 DIAGNOSIS — I5022 Chronic systolic (congestive) heart failure: Secondary | ICD-10-CM

## 2016-02-02 DIAGNOSIS — I472 Ventricular tachycardia, unspecified: Secondary | ICD-10-CM

## 2016-02-02 LAB — BASIC METABOLIC PANEL
BUN: 22 mg/dL (ref 7–25)
CALCIUM: 8.9 mg/dL (ref 8.6–10.3)
CO2: 32 mmol/L — AB (ref 20–31)
CREATININE: 1.24 mg/dL — AB (ref 0.70–1.18)
Chloride: 104 mmol/L (ref 98–110)
Glucose, Bld: 84 mg/dL (ref 65–99)
Potassium: 4.2 mmol/L (ref 3.5–5.3)
SODIUM: 143 mmol/L (ref 135–146)

## 2016-02-02 LAB — MAGNESIUM: MAGNESIUM: 1.9 mg/dL (ref 1.5–2.5)

## 2016-02-02 LAB — BRAIN NATRIURETIC PEPTIDE: BRAIN NATRIURETIC PEPTIDE: 155 pg/mL — AB (ref <100)

## 2016-02-02 NOTE — Progress Notes (Signed)
Referred to ICM clinic by Chanetta Marshall, NP.  1st ICM remote transmission scheduled for 03/06/2016.

## 2016-02-02 NOTE — Patient Instructions (Addendum)
Medication Instructions:   Your physician recommends that you continue on your current medications as directed. Please refer to the Current Medication list given to you today.   If you need a refill on your cardiac medications before your next appointment, please call your pharmacy.  Labwork: BMET BNP AND MAG    Testing/Procedures: NONE ORDERED  TODAY    Follow-Up: Your physician wants you to follow-up in:  [place]  [place] will receive a reminder letter in the mail two months in advance. If you don't receive a letter, please call our office to schedule the follow-up appointment.  Remote monitoring is used to monitor your Pacemaker of ICD from home. This monitoring reduces the number of office visits required to check your device to one time per year. It allows [place] to keep an eye on the functioning of your device to ensure it is working properly. You are scheduled for a device check from home on . 05/03/16..You may send your transmission at any time that day. If you have a wireless device, the transmission will be sent automatically. After your physician reviews your transmission, you will receive a postcard with your next transmission date.      Any Other Special Instructions Will Be Listed Below (If Applicable).

## 2016-02-03 ENCOUNTER — Telehealth: Payer: Self-pay | Admitting: *Deleted

## 2016-02-03 NOTE — Telephone Encounter (Signed)
SPOKE TO PT ABOUT RESULTS AND VERBALIZED UNDERSTANDING  

## 2016-02-03 NOTE — Telephone Encounter (Signed)
-----   Message from Patsey Berthold, NP sent at 02/03/2016  8:25 AM EST ----- Please notify patient of stable labs. Thanks!

## 2016-02-15 ENCOUNTER — Encounter: Payer: Self-pay | Admitting: Internal Medicine

## 2016-02-15 ENCOUNTER — Ambulatory Visit (INDEPENDENT_AMBULATORY_CARE_PROVIDER_SITE_OTHER): Payer: Medicare Other | Admitting: Internal Medicine

## 2016-02-15 VITALS — BP 142/80 | HR 58 | Ht 72.0 in | Wt 311.8 lb

## 2016-02-15 DIAGNOSIS — Z23 Encounter for immunization: Secondary | ICD-10-CM | POA: Insufficient documentation

## 2016-02-15 DIAGNOSIS — Z95810 Presence of automatic (implantable) cardiac defibrillator: Secondary | ICD-10-CM

## 2016-02-15 DIAGNOSIS — I5022 Chronic systolic (congestive) heart failure: Secondary | ICD-10-CM | POA: Diagnosis not present

## 2016-02-15 DIAGNOSIS — I10 Essential (primary) hypertension: Secondary | ICD-10-CM

## 2016-02-15 NOTE — Patient Instructions (Addendum)
Medication Instructions:  Your physician recommends that you continue on your current medications as directed. Please refer to the Current Medication list given to you today.  Labwork: None   Testing/Procedures: None   Follow-Up: Your physician recommends that you schedule a follow-up appointment in: 3 MONTHS WITH DR [name].  Any Other Special Instructions Will Be Listed Below (If Applicable).  1. READ ABOUT ENTRESTO 2. CHECK YOU BLOOD PRESSURES DAILY AND CALL THE OFFICE BACK IN A FEW WEEKS WITH YOUR  READINGS   If you need a refill on your cardiac medications before your next appointment, please call your pharmacy.

## 2016-02-15 NOTE — Progress Notes (Addendum)
OFFICE NOTE  Chief Complaint:  Routine follow-up  Primary Care Physician: Elyn Peers, MD  HPI:  Rickey Rice  is a 75 year old gentleman with a history of diabetes, hypertension, hypothyroidism, and morbid obesity. In 2010 he had complaints of chest pain and underwent stress testing with [facility] which was a 2-day nuclear stress test and was apparently negative. Recently he underwent a colonoscopy and a preoperative EKG was abnormal. I unfortunately do not have that EKG to review, but I did review the EKG from your office on April 09, 2012 which showed a borderline intraventricular conduction delay, a sinus rhythm at 66, and poor R-wave progression. Today in the office he has an abnormal EKG demonstrating a left bundle branch block with a QRS duration of 168 msec, heart rate of 78 in sinus. His only symptoms are shortness of breath with exertion. He underwent evaluation of his left bundle branch block in February 2014. This is a 2 day nuclear stress test which was negative for ischemia. There was a small inferior and apical defect which could represent scar versus artifact. EF was mildly reduced at 49%.    Rickey Rice returns today for followup. He underwent knee replacement surgery as we had authorized him to do. He is tolerated surgery without any complications. He reports that he is recovering fairly well and is more mobile than he had been in the past. He started to do some exercises at the [facility] and also this will translate into weight loss. Blood pressure has been stable he denies any chest pain.  He had blood work in January from his primary care provider which showed a total cholesterol of 128, HDL 51, triglycerides 53 and LDL 65. His hemoglobin A1c was 6.2.  Rickey Rice is now contemplating lethargy. Unfortunately has not been able to walk enough to lose weight in fact has gained some weight. 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 He feels like it's time to retire work on his generalized health. I would tend to agree with this.  I saw Rickey Rice back in the office today. He recently followed up from the hospital after having a sudden cardiac arrest. Unfortunately he was revived and is now status post AICD. He was to have CRT-D therapy, however there was difficulty with the LV lead, therefore he only has a single ventricle pacing with defibrillator functions. He is tolerating this well and had a small episode with fever postoperatively, but this was thought to be due to possibly a UTI. There is no evidence of pacemaker site fluctuance or swelling. Overall he feels he is doing well. He started exercising is managed to lose some weight. He is less short of breath and is more active.  Rickey Rice returns today for follow-up. Overall he is doing exceedingly well. He's managed to lose about 10 pounds. He is now below 300 pounds. His shortness of breath is improved some and he is ambulating better on his knees. He's had no device problems including no firings. He denies any chest pain or worsening shortness of breath and has not had any presyncopal or syncopal episodes.  Rickey Rice returns today for follow-up. Unfortunately he's gained some of his weight back. He denies a chest pain or worsening shortness of breath. He does not have any swelling. His last EF was as mentioned 40% in March 2016. EKG shows left bundle branch block. He scheduled to see Dr. [name] back for device interrogation  next month.  11/12/2015  Rickey Rice was seen today in follow-up. Unfortunately he was recently seen for some weight gain and fatigue by Rosaria Ferries, PA-C. She felt that he might be getting some extra fluid and recommended a short trial of increased Lasix. Weight is actually been fairly stable now at 300-301. Around that time he was found to have a UTI was treated for that and his symptoms improved. He was also noted to have problems with  infection and underwent multiple rounds of antibiotics. This led to problems with diarrhea or digestive problems and ultimately he went on probiotics. Recently, though he was talking on the phone and had an episode of transient global amnesia shortly after his wife left the house. He called her back and she came home and noted that he may have had an episode with his defibrillator. This was subsequently confirmed to be VT and he underwent a shock for that. He is seen Dr. [name] who increased his carvedilol to 12-1/2 mg twice a day. Blood pressure is actually a little low today but he reports being asymptomatic with that.  02/15/2016  Rickey Rice returns today for follow-up. He reports feeling well and denies worsening shortness of breath or chest pain. HE has not had any presyncopal spells. He just saw EP who noted his defibrillator was working properly. We again discussed strategies for heart failure management, including possibly switching lisinopril over to [facility]. He did not seem interested in this. It is noted his bp is higher today and that may at least allow uptitration of his lisinopril.  PMHx:  Past Medical History:  Diagnosis Date  . Arthritis   . Cardiac arrest ([place]) 05/2014   Primary VF arrest with successful resuscitation and s/p ICD implant  . Diabetes mellitus without complication ([place])    type 2  . Dyslipidemia   . History of nuclear stress test 04/2012   lexiscan - 2 day protocol; low risk study, evidence of inferrior & apical scar but no ischemia   . Hypertension   . Hypothyroidism   . LBBB (left bundle branch block)   . Morbid obesity ([place])   . NICM (nonischemic cardiomyopathy) ([place])   . Prostate enlargement     Past Surgical History:  Procedure Laterality Date  . BI-VENTRICULAR IMPLANTABLE CARDIOVERTER DEFIBRILLATOR N/A 06/18/2014   STJ CRTD implanted by Dr [name]  . COLONOSCOPY W/ BIOPSIES    . [place]  . KNEE ARTHROSCOPY Left   . LEFT HEART  CATHETERIZATION WITH CORONARY ANGIOGRAM N/A 06/12/2014   Procedure: LEFT HEART CATHETERIZATION WITH CORONARY ANGIOGRAM;  Surgeon: Leonie Man, MD;  Location: [facility] CATH LAB;  Service: Cardiovascular;  Laterality: N/A;  . [place]  . TOTAL KNEE ARTHROPLASTY Right 02/05/2013   Procedure: TOTAL KNEE ARTHROPLASTY;  Surgeon: Kerin Salen, MD;  Location: [place];  Service: Orthopedics;  Laterality: Right;  . TOTAL KNEE ARTHROPLASTY Left 01/19/2014   Procedure: LEFT TOTAL KNEE ARTHROPLASTY;  Surgeon: Kerin Salen, MD;  Location: [place];  Service: Orthopedics;  Laterality: Left;    FAMHx:  Family History  Problem Relation Age of Onset  . Cancer Mother   . Kidney disease Brother   . Hypertension Sister     SOCHx:   reports that he has never smoked. He has never used smokeless tobacco. He reports that he does not drink alcohol or use drugs.  ALLERGIES:  Allergies  Allergen Reactions  . Bee Venom Swelling    Swelling  on lips and tongue  . Demerol [Meperidine]   . Pioglitazone     Lips swelling  . Shrimp [Shellfish Allergy]     rash  . Sulfa Antibiotics     ROS: Pertinent items noted in HPI and remainder of comprehensive ROS otherwise negative.  HOME MEDS: Current Outpatient Prescriptions  Medication Sig Dispense Refill  . amoxicillin (AMOXIL) 500 MG capsule Take 500 mg by mouth as directed. Prior to dental/periodontal appointments    . aspirin EC 81 MG tablet Take 81 mg by mouth daily.    . carvedilol (COREG) 12.5 MG tablet Take 12.5 mg by mouth 2 (two) times daily with a meal.    . furosemide (LASIX) 40 MG tablet TAKE 1 TABLET(40 MG) BY MOUTH DAILY 30 tablet 11  . glimepiride (AMARYL) 4 MG tablet Take 8 mg by mouth daily before breakfast.     . levothyroxine (SYNTHROID, LEVOTHROID) 75 MCG tablet Take 75 mcg by mouth daily before breakfast.    . lisinopril (PRINIVIL,ZESTRIL) 10 MG tablet TAKE 1 TABLET(10 MG) BY MOUTH DAILY 30 tablet 10  . LUTEIN ESTERS PO Take 2 capsules  by mouth every other day.     . rosuvastatin (CRESTOR) 10 MG tablet Take 1 tablet (10 mg total) by mouth daily at 6 PM. 30 tablet 0  . saxagliptin HCl (ONGLYZA) 5 MG TABS tablet Take 5 mg by mouth daily.    . tamsulosin (FLOMAX) 0.4 MG CAPS capsule Take 0.4 mg by mouth every other day.      No current facility-administered medications for this visit.     LABS/IMAGING: No results found for this or any previous visit (from the past 48 hour(s)). No results found.  VITALS: BP (!) 142/80 (BP Location: Right Arm)   Pulse (!) 58   Ht 6' (1.829 m)   Wt (!) 311 lb 12.8 oz (141.4 kg)   BMI 42.29 kg/m   EXAM: General appearance: alert, no distress and morbidly obese Neck: no carotid bruit, no JVD and thyroid not enlarged, symmetric, no tenderness/mass/nodules Lungs: clear to auscultation bilaterally and AICD in left upper chest Heart: regular rate and rhythm, S1, S2 normal, no murmur, click, rub or gallop Abdomen: soft, non-tender; bowel sounds normal; no masses,  no organomegaly and Morbidly obese Extremities: extremities normal, atraumatic, no cyanosis or edema Pulses: 2+ and symmetric Skin: Skin color, texture, turgor normal. No rashes or lesions  EKG: AV paced rhythm at 58  ASSESSMENT: 1. Status post aborted sudden cardiac death-status post placement of a [employer] Bi-V AICD (LV lead is disabled due to dysfunction) 2. Recent VT/VF with appropriate AICD shock 3. Negative nuclear stress test for ischemia, fixed inferoapical defect suggesting possible scar versus artifact, EF 49% 3.   Diabetes type 2 on oral medications - A1c 5.8 4.   Hypertension-well controlled 5.   Dyslipidemia on statin - well controlled 6.   Ischemic cardiomyopathy-EF 30-35%  PLAN: 1.   Rickey Rice would benefit from either increase in his ACE-I or switching to [facility]. He is not interested in [place] at this time. He wants to consider it more. For now, will have him monitor his BP at home - if it remains  elevated, would consider increasing lisinopril to 20 mg daily.  Follow-up in 3 months.  Pixie Casino, MD, [facility] Attending Cardiologist [facility]  Pixie Casino 02/15/2016, 7:59 PM

## 2016-03-06 ENCOUNTER — Telehealth: Payer: Self-pay

## 2016-03-06 ENCOUNTER — Ambulatory Visit (INDEPENDENT_AMBULATORY_CARE_PROVIDER_SITE_OTHER): Payer: Medicare Other

## 2016-03-06 DIAGNOSIS — I5022 Chronic systolic (congestive) heart failure: Secondary | ICD-10-CM

## 2016-03-06 DIAGNOSIS — Z95810 Presence of automatic (implantable) cardiac defibrillator: Secondary | ICD-10-CM | POA: Diagnosis not present

## 2016-03-06 NOTE — Progress Notes (Signed)
EPIC Encounter for ICM Monitoring  Patient Name: Rickey Rice is a 75 y.o. male Date: 03/06/2016 Primary Care Physican: Elyn Peers, MD Primary Cardiologist: [name] Electrophysiologist: Faustino Congress Weight: 305 lbs Bi-V Pacing:  >99%       1st ICM encounter.  ICM intro given and he agreed to monthly ICM calls.  Heart failure questions reviewed.  He denied any fluid symptoms  Thoracic impedance normal.  Was abnormal suggesting fluid accumulation from 11/9 to 11/14.  Currently has GI virus.  Recommendations:  No changes today.  Encouraged to limit salt intake to 2000 mg daily.  He has a goal to lose weight and is checking BP daily and will fax to Dr Debara Pickett.    Follow-up plan: ICM clinic phone appointment on 04/06/2016.  Copy of ICM check sent to primary cardiologist and device physician.   ICM trend: 03/06/2016       Rosalene Billings, RN 03/06/2016 5:02 PM

## 2016-03-06 NOTE — Telephone Encounter (Signed)
Remote ICM transmission received.  Attempted patient call and left detailed message regarding transmission and to return call.    

## 2016-04-06 ENCOUNTER — Ambulatory Visit (INDEPENDENT_AMBULATORY_CARE_PROVIDER_SITE_OTHER): Payer: Medicare Other

## 2016-04-06 ENCOUNTER — Telehealth: Payer: Self-pay | Admitting: Cardiology

## 2016-04-06 DIAGNOSIS — Z95810 Presence of automatic (implantable) cardiac defibrillator: Secondary | ICD-10-CM | POA: Diagnosis not present

## 2016-04-06 DIAGNOSIS — I5022 Chronic systolic (congestive) heart failure: Secondary | ICD-10-CM

## 2016-04-06 NOTE — Telephone Encounter (Signed)
Spoke with pt and reminded pt of remote transmission that is due today. Pt verbalized understanding.   

## 2016-04-07 NOTE — Progress Notes (Signed)
EPIC Encounter for ICM Monitoring  Patient Name: Rickey Rice is a 76 y.o. male Date: 04/07/2016 Primary Care Physican: Elyn Peers, MD Primary Cardiologist: [name] Electrophysiologist: Faustino Congress Weight:    302 lbs Bi-V Pacing:  >99%            Heart Failure questions reviewed, pt asymptomatic.  Thoracic impedance normal.  Was abnormal 03/20/16 to 03/29/16.  Recommendations: No changes. Discussed limiting dietary salt intake to 2000 mg/day and fluid intake to < 2 liters per day. Encouraged to call for fluid symptoms.  Follow-up plan: ICM clinic phone appointment on 05/08/2016.  Copy of ICM check sent to device physician.   3 month ICM trend: 04/06/2016   1 Year ICM trend:      Rosalene Billings, RN 04/07/2016 7:58 AM

## 2016-04-26 ENCOUNTER — Other Ambulatory Visit: Payer: Self-pay | Admitting: *Deleted

## 2016-04-26 MED ORDER — FUROSEMIDE 40 MG PO TABS: ORAL_TABLET | ORAL | 2 refills | Status: DC

## 2016-05-08 ENCOUNTER — Ambulatory Visit (INDEPENDENT_AMBULATORY_CARE_PROVIDER_SITE_OTHER): Payer: Medicare Other

## 2016-05-08 DIAGNOSIS — Z95810 Presence of automatic (implantable) cardiac defibrillator: Secondary | ICD-10-CM

## 2016-05-08 DIAGNOSIS — I5022 Chronic systolic (congestive) heart failure: Secondary | ICD-10-CM | POA: Diagnosis not present

## 2016-05-08 NOTE — Progress Notes (Signed)
EPIC Encounter for ICM Monitoring  Patient Name: Rickey Rice is a 76 y.o. male Date: 05/08/2016 Primary Care Physican: Elyn Peers, MD Primary Cardiologist:[name] Electrophysiologist: [name] Dry Weight:302 lbs Bi-V Pacing: >99%      Heart Failure questions reviewed, pt asymptomatic    Thoracic impedance normal   Recommendations: No changes. Reminded to limit dietary salt intake to 2000 mg/day and fluid intake to < 2 liters/day. Encouraged to call for fluid symptoms.  Follow-up plan: ICM clinic phone appointment on 06/08/2016 and office visit with Dr Debara Pickett on 05/18/2016.  Copy of ICM check sent to device physician.   3 month ICM trend: 05/08/2016  1 Year ICM trend:      Rosalene Billings, RN 05/08/2016 11:20 AM

## 2016-05-18 ENCOUNTER — Ambulatory Visit: Payer: Medicare Other | Admitting: Internal Medicine

## 2016-05-29 ENCOUNTER — Ambulatory Visit (INDEPENDENT_AMBULATORY_CARE_PROVIDER_SITE_OTHER): Payer: Medicare Other | Admitting: Internal Medicine

## 2016-05-29 ENCOUNTER — Encounter: Payer: Self-pay | Admitting: Internal Medicine

## 2016-05-29 VITALS — BP 116/68 | HR 63 | Ht 71.5 in | Wt 310.4 lb

## 2016-05-29 DIAGNOSIS — I10 Essential (primary) hypertension: Secondary | ICD-10-CM | POA: Diagnosis not present

## 2016-05-29 DIAGNOSIS — Z95810 Presence of automatic (implantable) cardiac defibrillator: Secondary | ICD-10-CM | POA: Diagnosis not present

## 2016-05-29 DIAGNOSIS — I447 Left bundle-branch block, unspecified: Secondary | ICD-10-CM | POA: Diagnosis not present

## 2016-05-29 DIAGNOSIS — I5022 Chronic systolic (congestive) heart failure: Secondary | ICD-10-CM

## 2016-05-29 MED ORDER — SACUBITRIL-VALSARTAN 24-26 MG PO TABS: 1.0000 | ORAL_TABLET | Freq: Two times a day (BID) | ORAL | 11 refills | Status: DC

## 2016-05-29 NOTE — Patient Instructions (Addendum)
Medication Instructions:  Stop lisinopril and wait 2 days to start Thursday Entresto 24/26 twice daily   If you need a refill on your cardiac medications before your next appointment, please call your pharmacy.   Special Instructions:     Thank you for choosing [facility] at [facility], LPN

## 2016-05-30 ENCOUNTER — Telehealth: Payer: Self-pay | Admitting: Internal Medicine

## 2016-05-30 NOTE — Telephone Encounter (Signed)
I spoke to Rickey Rice. Notes that he took aliskeren Medical laboratory scientific officer) in the past and just wanted to make sure aliskeren not a component of his current medications (concern for interaction w entresto). Informed him this is not a component of his current med regimen, advised to verify it's not on any bottles at home and informed patient that aliskeren is a medication, so it would be listed as the drug name, not an ingredient. Pt voiced understanding and thanks. Aware that I will call him back should there be any concerns about his other meds.

## 2016-05-30 NOTE — Telephone Encounter (Signed)
No drug-drug interactions exist between current therapy ans Entresto.  Aliskiren not part of formulation.

## 2016-05-30 NOTE — Telephone Encounter (Signed)
New Message     Has question on the Entresto before he starts taking it, glimepiride (AMARYL) 4 MG tablet   saxagliptin HCl (ONGLYZA) 5 MG TABS tablet Take 5 mg    Does either of these medication have eliskiren in them?

## 2016-05-30 NOTE — Progress Notes (Signed)
OFFICE NOTE  Chief Complaint:  No complaints  Primary Care Physician: Elyn Peers, MD  HPI:  Rickey Rice  is a 76 year old gentleman with a history of diabetes, hypertension, hypothyroidism, and morbid obesity. In 2010 he had complaints of chest pain and underwent stress testing with [facility] which was a 2-day nuclear stress test and was apparently negative. Recently he underwent a colonoscopy and a preoperative EKG was abnormal. I unfortunately do not have that EKG to review, but I did review the EKG from your office on April 09, 2012 which showed a borderline intraventricular conduction delay, a sinus rhythm at 66, and poor R-wave progression. Today in the office he has an abnormal EKG demonstrating a left bundle branch block with a QRS duration of 168 msec, heart rate of 78 in sinus. His only symptoms are shortness of breath with exertion. He underwent evaluation of his left bundle branch block in February 2014. This is a 2 day nuclear stress test which was negative for ischemia. There was a small inferior and apical defect which could represent scar versus artifact. EF was mildly reduced at 49%.    Rickey Rice returns today for followup. He underwent knee replacement surgery as we had authorized him to do. He is tolerated surgery without any complications. He reports that he is recovering fairly well and is more mobile than he had been in the past. He started to do some exercises at the [facility] and also this will translate into weight loss. Blood pressure has been stable he denies any chest pain.  He had blood work in January from his primary care provider which showed a total cholesterol of 128, HDL 51, triglycerides 53 and LDL 65. His hemoglobin A1c was 6.2.  Rickey Rice is now contemplating lethargy. Unfortunately has not been able to walk enough to lose weight in fact has gained some weight. 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  He feels like it's time to retire work on his generalized health. I would tend to agree with this.  I saw Rickey Rice back in the office today. He recently followed up from the hospital after having a sudden cardiac arrest. Unfortunately he was revived and is now status post AICD. He was to have CRT-D therapy, however there was difficulty with the LV lead, therefore he only has a single ventricle pacing with defibrillator functions. He is tolerating this well and had a small episode with fever postoperatively, but this was thought to be due to possibly a UTI. There is no evidence of pacemaker site fluctuance or swelling. Overall he feels he is doing well. He started exercising is managed to lose some weight. He is less short of breath and is more active.  Rickey Rice returns today for follow-up. Overall he is doing exceedingly well. He's managed to lose about 10 pounds. He is now below 300 pounds. His shortness of breath is improved some and he is ambulating better on his knees. He's had no device problems including no firings. He denies any chest pain or worsening shortness of breath and has not had any presyncopal or syncopal episodes.  Rickey Rice returns today for follow-up. Unfortunately he's gained some of his weight back. He denies a chest pain or worsening shortness of breath. He does not have any swelling. His last EF was as mentioned 40% in March 2016. EKG shows left bundle branch block. He scheduled to see Dr. [name] back for device interrogation  next month.  11/12/2015  Rickey Rice was seen today in follow-up. Unfortunately he was recently seen for some weight gain and fatigue by Rosaria Ferries, PA-C. She felt that he might be getting some extra fluid and recommended a short trial of increased Lasix. Weight is actually been fairly stable now at 300-301. Around that time he was found to have a UTI was treated for that and his symptoms improved. He was also noted to have problems with infection  and underwent multiple rounds of antibiotics. This led to problems with diarrhea or digestive problems and ultimately he went on probiotics. Recently, though he was talking on the phone and had an episode of transient global amnesia shortly after his wife left the house. He called her back and she came home and noted that he may have had an episode with his defibrillator. This was subsequently confirmed to be VT and he underwent a shock for that. He is seen Dr. [name] who increased his carvedilol to 12-1/2 mg twice a day. Blood pressure is actually a little low today but he reports being asymptomatic with that.  02/15/2016  Rickey Rice returns today for follow-up. He reports feeling well and denies worsening shortness of breath or chest pain. HE has not had any presyncopal spells. He just saw EP who noted his defibrillator was working properly. We again discussed strategies for heart failure management, including possibly switching lisinopril over to [facility]. He did not seem interested in this. It is noted his bp is higher today and that may at least allow uptitration of his lisinopril.  05/29/2016  Rickey Rice was seen today in follow-up. Weight is down slightly although has not made a significant improvement. He needs to start to do more exercise but he has blamed the weather and his arthritis. Blood pressure appears to be well-controlled and he brought readings in the office today. Although I do feel that there still potentially some room to consider adding Entresto. We discussed the medication today and it would effectively replace his lisinopril. He understands he would need to wash off the lisinopril for 36-48 hours before starting Entresto. We can provide him with samples and help him secure preauthorization.  PMHx:  Past Medical History:  Diagnosis Date  . Arthritis   . Cardiac arrest ([place]) 05/2014   Primary VF arrest with successful resuscitation and s/p ICD implant  . Diabetes mellitus  without complication ([place])    type 2  . Dyslipidemia   . History of nuclear stress test 04/2012   lexiscan - 2 day protocol; low risk study, evidence of inferrior & apical scar but no ischemia   . Hypertension   . Hypothyroidism   . LBBB (left bundle branch block)   . Morbid obesity ([place])   . NICM (nonischemic cardiomyopathy) ([place])   . Prostate enlargement     Past Surgical History:  Procedure Laterality Date  . BI-VENTRICULAR IMPLANTABLE CARDIOVERTER DEFIBRILLATOR N/A 06/18/2014   STJ CRTD implanted by Dr [name]  . COLONOSCOPY W/ BIOPSIES    . [place]  . KNEE ARTHROSCOPY Left   . LEFT HEART CATHETERIZATION WITH CORONARY ANGIOGRAM N/A 06/12/2014   Procedure: LEFT HEART CATHETERIZATION WITH CORONARY ANGIOGRAM;  Surgeon: Leonie Man, MD;  Location: [facility] CATH LAB;  Service: Cardiovascular;  Laterality: N/A;  . [place]  . TOTAL KNEE ARTHROPLASTY Right 02/05/2013   Procedure: TOTAL KNEE ARTHROPLASTY;  Surgeon: Kerin Salen, MD;  Location: [place];  Service: Orthopedics;  Laterality:  Right;  [place] TOTAL KNEE ARTHROPLASTY Left 01/19/2014   Procedure: LEFT TOTAL KNEE ARTHROPLASTY;  Surgeon: Kerin Salen, MD;  Location: [place];  Service: Orthopedics;  Laterality: Left;    FAMHx:  Family History  Problem Relation Age of Onset  . Cancer Mother   . Kidney disease Brother   . Hypertension Sister     SOCHx:   reports that he has never smoked. He has never used smokeless tobacco. He reports that he does not drink alcohol or use drugs.  ALLERGIES:  Allergies  Allergen Reactions  . Bee Venom Swelling    Swelling on lips and tongue  . Demerol [Meperidine]   . Pioglitazone     Lips swelling  . Shrimp [Shellfish Allergy]     rash  . Sulfa Antibiotics     ROS: Pertinent items noted in HPI and remainder of comprehensive ROS otherwise negative.  HOME MEDS: Current Outpatient Prescriptions  Medication Sig Dispense Refill  . amoxicillin (AMOXIL) 500 MG capsule Take  500 mg by mouth as directed. Prior to dental/periodontal appointments    . aspirin EC 81 MG tablet Take 81 mg by mouth daily.    . carvedilol (COREG) 12.5 MG tablet Take 12.5 mg by mouth 2 (two) times daily with a meal.    . furosemide (LASIX) 40 MG tablet TAKE 1 TABLET(40 MG) BY MOUTH DAILY 90 tablet 2  . glimepiride (AMARYL) 4 MG tablet Take 8 mg by mouth daily before breakfast.     . levothyroxine (SYNTHROID, LEVOTHROID) 75 MCG tablet Take 75 mcg by mouth daily before breakfast.    . LUTEIN ESTERS PO Take 2 capsules by mouth every other day.     . rosuvastatin (CRESTOR) 10 MG tablet Take 1 tablet (10 mg total) by mouth daily at 6 PM. 30 tablet 0  . saxagliptin HCl (ONGLYZA) 5 MG TABS tablet Take 5 mg by mouth daily.    . tamsulosin (FLOMAX) 0.4 MG CAPS capsule Take 0.4 mg by mouth every other day.     . sacubitril-valsartan (ENTRESTO) 24-26 MG Take 1 tablet by mouth 2 (two) times daily. 60 tablet 11   No current facility-administered medications for this visit.     LABS/IMAGING: No results found for this or any previous visit (from the past 48 hour(s)). No results found.  VITALS: BP 116/68   Pulse 63   Ht 5' 11.5" (1.816 m)   Wt (!) 310 lb 6.4 oz (140.8 kg)   BMI 42.69 kg/m   EXAM: General appearance: alert, no distress and morbidly obese Neck: no carotid bruit, no JVD and thyroid not enlarged, symmetric, no tenderness/mass/nodules Lungs: clear to auscultation bilaterally and AICD in left upper chest Heart: regular rate and rhythm, S1, S2 normal, no murmur, click, rub or gallop Abdomen: soft, non-tender; bowel sounds normal; no masses,  no organomegaly and Morbidly obese Extremities: extremities normal, atraumatic, no cyanosis or edema Pulses: 2+ and symmetric Skin: Skin color, texture, turgor normal. No rashes or lesions  EKG: AV paced rhythm at 63  ASSESSMENT: 1. Status post aborted sudden cardiac death-status post placement of a [employer] Bi-V AICD (LV lead is disabled  due to dysfunction) 2. Recent VT/VF with appropriate AICD shock 3. Negative nuclear stress test for ischemia, fixed inferoapical defect suggesting possible scar versus artifact, EF 49% 3.   Diabetes type 2 on oral medications - A1c 5.8 4.   Hypertension-well controlled 5.   Dyslipidemia on statin - well controlled 6.   Ischemic cardiomyopathy-EF  30-35%  PLAN: 1.   Mr. Hardeman would benefit from switching to [facility]. BP should tolerate it. We'll plan to wean off his lisinopril for 36-48 hours and start low-dose Entresto 24/26 mg. Blood work after words and consider up titrating that in 2-4 weeks.   Follow-up in 3 months.  Pixie Casino, MD, [facility] Attending Cardiologist [place] 05/30/2016, 6:31 PM

## 2016-05-30 NOTE — Telephone Encounter (Signed)
Pt informed and voiced thanks.

## 2016-06-07 ENCOUNTER — Telehealth: Payer: Self-pay | Admitting: Internal Medicine

## 2016-06-07 NOTE — Telephone Encounter (Signed)
Note    New Message  Pt voiced wanting to know something about a real estate seminar for heart patients we have twice a year and wondering if someone can reach out to him or send him something in the mail about it.  Please f/u

## 2016-06-07 NOTE — Telephone Encounter (Signed)
Advised pt that ICD support group meeting will be on 5/22 this year.   Registration begins at 5:30 p.m.. Informed him that office will be sending out information flyers to pts soon. He thanks me for helping and giving him the information.

## 2016-06-07 NOTE — Telephone Encounter (Signed)
New Message  Pt voiced wanting to know something about a real estate seminar for heart patients we have twice a year and wondering if someone can reach out to him or send him something in the mail about it.  Please f/u

## 2016-06-08 ENCOUNTER — Ambulatory Visit (INDEPENDENT_AMBULATORY_CARE_PROVIDER_SITE_OTHER): Payer: Medicare Other | Admitting: *Deleted

## 2016-06-08 ENCOUNTER — Telehealth: Payer: Self-pay | Admitting: Cardiology

## 2016-06-08 DIAGNOSIS — I5022 Chronic systolic (congestive) heart failure: Secondary | ICD-10-CM

## 2016-06-08 DIAGNOSIS — I472 Ventricular tachycardia, unspecified: Secondary | ICD-10-CM

## 2016-06-08 DIAGNOSIS — Z95810 Presence of automatic (implantable) cardiac defibrillator: Secondary | ICD-10-CM

## 2016-06-08 NOTE — Progress Notes (Signed)
Remote ICD transmission.   

## 2016-06-08 NOTE — Progress Notes (Signed)
EPIC Encounter for ICM Monitoring  Patient Name: Rickey Rice is a 76 y.o. male Date: 06/08/2016 Primary Care Physican: Elyn Peers, MD Primary Cardiologist:[name] Electrophysiologist: Faustino Congress Weight:unknown Bi-V Pacing: >99%         Transmission reviewed   Thoracic impedance normal   Prescribed dosage: Furosemide 40 mg 1 tablet daily  Recommendations: None  Follow-up plan: ICM clinic phone appointment on 07/11/2016.  Copy of ICM check sent to device physician.   3 month ICM trend: 06/08/2016   1 Year ICM trend:      Rosalene Billings, RN 06/08/2016 3:15 PM

## 2016-06-08 NOTE — Telephone Encounter (Signed)
Spoke with pt and reminded pt of remote transmission that is due today. Pt verbalized understanding.   

## 2016-06-09 ENCOUNTER — Encounter: Payer: Self-pay | Admitting: Cardiology

## 2016-06-12 LAB — CUP PACEART REMOTE DEVICE CHECK
Battery Remaining Longevity: 68 mo
Battery Remaining Percentage: 75 %
Brady Statistic AP VP Percent: 1 %
Brady Statistic RA Percent Paced: 1 %
Date Time Interrogation Session: 20180315152316
HIGH POWER IMPEDANCE MEASURED VALUE: 59 Ohm
HIGH POWER IMPEDANCE MEASURED VALUE: 59 Ohm
Implantable Lead Implant Date: 20160324
Implantable Lead Implant Date: 20160324
Implantable Lead Implant Date: 20160324
Implantable Lead Location: 753858
Implantable Lead Location: 753860
Implantable Pulse Generator Implant Date: 20160324
Lead Channel Impedance Value: 530 Ohm
Lead Channel Impedance Value: 910 Ohm
Lead Channel Pacing Threshold Amplitude: 0.75 V
Lead Channel Pacing Threshold Amplitude: 1.5 V
Lead Channel Pacing Threshold Pulse Width: 0.5 ms
Lead Channel Pacing Threshold Pulse Width: 0.5 ms
Lead Channel Pacing Threshold Pulse Width: 1.5 ms
Lead Channel Sensing Intrinsic Amplitude: 12 mV
Lead Channel Setting Sensing Sensitivity: 0.5 mV
MDC IDC LEAD LOCATION: 753859
MDC IDC MSMT BATTERY VOLTAGE: 2.99 V
MDC IDC MSMT LEADCHNL RA IMPEDANCE VALUE: 400 Ohm
MDC IDC MSMT LEADCHNL RA PACING THRESHOLD AMPLITUDE: 1 V
MDC IDC MSMT LEADCHNL RA SENSING INTR AMPL: 2.5 mV
MDC IDC SET LEADCHNL LV PACING AMPLITUDE: 1.75 V
MDC IDC SET LEADCHNL LV PACING PULSEWIDTH: 1.2 ms
MDC IDC SET LEADCHNL RA PACING AMPLITUDE: 2 V
MDC IDC SET LEADCHNL RV PACING AMPLITUDE: 2.5 V
MDC IDC SET LEADCHNL RV PACING PULSEWIDTH: 0.5 ms
MDC IDC STAT BRADY AP VS PERCENT: 1 %
MDC IDC STAT BRADY AS VP PERCENT: 99 %
MDC IDC STAT BRADY AS VS PERCENT: 1 %
Pulse Gen Serial Number: 7240350

## 2016-06-23 ENCOUNTER — Encounter: Payer: Self-pay | Admitting: Cardiology

## 2016-07-11 ENCOUNTER — Ambulatory Visit (INDEPENDENT_AMBULATORY_CARE_PROVIDER_SITE_OTHER): Payer: Medicare Other

## 2016-07-11 DIAGNOSIS — Z95810 Presence of automatic (implantable) cardiac defibrillator: Secondary | ICD-10-CM

## 2016-07-11 DIAGNOSIS — I5022 Chronic systolic (congestive) heart failure: Secondary | ICD-10-CM

## 2016-07-11 NOTE — Progress Notes (Signed)
EPIC Encounter for ICM Monitoring  Patient Name: Rickey Rice is a 76 y.o. male Date: 07/11/2016 Primary Care Physican: Elyn Peers, MD Primary Cardiologist:[name] Electrophysiologist: Faustino Congress Weight:unknown Bi-V Pacing: >99%       Spoke to wife.  Heart Failure questions reviewed, pt asymptomatic today but did have some leg swelling during time of decreased impedance.     Thoracic impedance normal but was abnormal suggesting fluid accumulation from 06/30/2016 to 07/07/2016.  Prescribed dosage: Furosemide 40 mg 1 tablet daily  Recommendations: No changes.   Encouraged to call for fluid symptoms.  Follow-up plan: ICM clinic phone appointment on 08/11/2016.  Defib office appointment scheduled on 09/05/2016 with Dr [name].  Copy of ICM check sent to device physician.   3 month ICM trend: 07/11/2016   1 Year ICM trend:      Rosalene Billings, RN 07/11/2016 8:06 AM

## 2016-08-11 ENCOUNTER — Ambulatory Visit (INDEPENDENT_AMBULATORY_CARE_PROVIDER_SITE_OTHER): Payer: Medicare Other

## 2016-08-11 DIAGNOSIS — Z95810 Presence of automatic (implantable) cardiac defibrillator: Secondary | ICD-10-CM

## 2016-08-11 DIAGNOSIS — I5022 Chronic systolic (congestive) heart failure: Secondary | ICD-10-CM | POA: Diagnosis not present

## 2016-08-11 NOTE — Progress Notes (Signed)
EPIC Encounter for ICM Monitoring  Patient Name: Rickey Rice is a 76 y.o. male Date: 08/11/2016 Primary Care Physican: Lucianne Lei, MD Primary Cardiologist:[name] Electrophysiologist: Faustino Congress Weight:unknown Bi-V Pacing: >99%            Heart Failure questions reviewed, pt asymptomatic and is feeling well.  Had cataract surgery yesterday and went well.    Thoracic impedance normal.  Prescribed dosage: Furosemide 40 mg 1 tablet daily  Recommendations: No changes.   Encouraged to call for fluid symptoms or use local ER for any urgent symptoms.  Follow-up plan: ICM clinic phone appointment on 10/06/2016.  Office appointment scheduled on 09/05/2016 with Dr [name].  Copy of ICM check sent to device physician.   3 month ICM trend: 08/11/2016   1 Year ICM trend:      Rosalene Billings, RN 08/11/2016 7:58 AM

## 2016-08-17 ENCOUNTER — Encounter: Payer: Self-pay | Admitting: Internal Medicine

## 2016-08-29 ENCOUNTER — Ambulatory Visit (INDEPENDENT_AMBULATORY_CARE_PROVIDER_SITE_OTHER): Payer: Medicare Other | Admitting: Urology

## 2016-08-29 DIAGNOSIS — N401 Enlarged prostate with lower urinary tract symptoms: Secondary | ICD-10-CM

## 2016-09-05 ENCOUNTER — Ambulatory Visit (INDEPENDENT_AMBULATORY_CARE_PROVIDER_SITE_OTHER): Payer: Medicare Other | Admitting: Internal Medicine

## 2016-09-05 ENCOUNTER — Encounter: Payer: Self-pay | Admitting: Internal Medicine

## 2016-09-05 VITALS — BP 136/84 | HR 77 | Ht 71.0 in | Wt 312.0 lb

## 2016-09-05 DIAGNOSIS — I5022 Chronic systolic (congestive) heart failure: Secondary | ICD-10-CM

## 2016-09-05 DIAGNOSIS — I428 Other cardiomyopathies: Secondary | ICD-10-CM

## 2016-09-05 DIAGNOSIS — Z95810 Presence of automatic (implantable) cardiac defibrillator: Secondary | ICD-10-CM

## 2016-09-05 DIAGNOSIS — I472 Ventricular tachycardia: Secondary | ICD-10-CM | POA: Diagnosis not present

## 2016-09-05 DIAGNOSIS — I4729 Other ventricular tachycardia: Secondary | ICD-10-CM

## 2016-09-05 NOTE — Patient Instructions (Signed)
Medication Instructions: - Your physician recommends that you continue on your current medications as directed. Please refer to the Current Medication list given to you today.  Labwork: - none ordered  Procedures/Testing: - none ordered  Follow-Up: - Remote monitoring is used to monitor your Pacemaker of ICD from home. This monitoring reduces the number of office visits required to check your device to one time per year. It allows [place] to keep an eye on the functioning of your device to ensure it is working properly. You are scheduled for a device check from home on 12/05/16. You may send your transmission at any time that day. If you have a wireless device, the transmission will be sent automatically. After your physician reviews your transmission, you will receive a postcard with your next transmission date.  - Your physician wants you to follow-up in: 1 year with Dr. [name]. You will receive a reminder letter in the mail two months in advance. If you don't receive a letter, please call our office to schedule the follow-up appointment.   Any Additional Special Instructions Will Be Listed Below (If Applicable).     If you need a refill on your cardiac medications before your next appointment, please call your pharmacy.

## 2016-09-05 NOTE — Progress Notes (Signed)
Patient Care Team: Lucianne Lei, MD as PCP - General (Family Medicine)   HPI  Rickey Rice is a 76 y.o. male Seen in follow-up for CRT-D implantation for aborted cardiac arrest in the setting of nonischemic cardiac myopathy and left bundle branch block.  Because of a short RV LV interval, it was ELECTED NOT to activate the LV leads  initially. We subsequently have  turned on the LV lead    He has had much less shortness of breath. He has been exercising some he has some peripheral edema but it is asymmetric left greater than right. He denies chest pain.  Date Cr K Mg  11/17  1.24 4.2  1.9           DATE TEST    3/16    Echo   EF 40 %   8/17    Echo   EF 30-35 %            Catheterization had demonstrated an ejection fraction of 40%  Coronaries were clean      Past Medical History:  Diagnosis Date  . Arthritis   . Cardiac arrest ([place]) 05/2014   Primary VF arrest with successful resuscitation and s/p ICD implant  . Diabetes mellitus without complication ([place])    type 2  . Dyslipidemia   . History of nuclear stress test 04/2012   lexiscan - 2 day protocol; low risk study, evidence of inferrior & apical scar but no ischemia   . Hypertension   . Hypothyroidism   . LBBB (left bundle branch block)   . Morbid obesity ([place])   . NICM (nonischemic cardiomyopathy) ([place])   . Prostate enlargement     Past Surgical History:  Procedure Laterality Date  . BI-VENTRICULAR IMPLANTABLE CARDIOVERTER DEFIBRILLATOR N/A 06/18/2014   STJ CRTD implanted by Dr [name]  . COLONOSCOPY W/ BIOPSIES    . [place]  . KNEE ARTHROSCOPY Left   . LEFT HEART CATHETERIZATION WITH CORONARY ANGIOGRAM N/A 06/12/2014   Procedure: LEFT HEART CATHETERIZATION WITH CORONARY ANGIOGRAM;  Surgeon: Leonie Man, MD;  Location: [facility] CATH LAB;  Service: Cardiovascular;  Laterality: N/A;  . [place]  . TOTAL KNEE ARTHROPLASTY Right 02/05/2013   Procedure: TOTAL KNEE ARTHROPLASTY;   Surgeon: Kerin Salen, MD;  Location: [place];  Service: Orthopedics;  Laterality: Right;  . TOTAL KNEE ARTHROPLASTY Left 01/19/2014   Procedure: LEFT TOTAL KNEE ARTHROPLASTY;  Surgeon: Kerin Salen, MD;  Location: [place];  Service: Orthopedics;  Laterality: Left;    Current Outpatient Prescriptions  Medication Sig Dispense Refill  . amoxicillin (AMOXIL) 500 MG capsule Take 500 mg by mouth as directed. Prior to dental/periodontal appointments    . aspirin EC 81 MG tablet Take 81 mg by mouth daily.    . carvedilol (COREG) 12.5 MG tablet Take 12.5 mg by mouth 2 (two) times daily with a meal.    . furosemide (LASIX) 40 MG tablet TAKE 1 TABLET(40 MG) BY MOUTH DAILY 90 tablet 2  . glimepiride (AMARYL) 4 MG tablet Take 8 mg by mouth daily before breakfast.     . levothyroxine (SYNTHROID, LEVOTHROID) 75 MCG tablet Take 75 mcg by mouth. Monday through Saturday    . LUTEIN ESTERS PO Take 2 capsules by mouth every other day.     . rosuvastatin (CRESTOR) 10 MG tablet Take 1 tablet (10 mg total) by mouth daily at 6 PM. 30 tablet 0  .  sacubitril-valsartan (ENTRESTO) 24-26 MG Take 1 tablet by mouth 2 (two) times daily. 60 tablet 11  . saxagliptin HCl (ONGLYZA) 5 MG TABS tablet Take 5 mg by mouth daily.     No current facility-administered medications for this visit.     Allergies  Allergen Reactions  . Bee Venom Swelling    Swelling on lips and tongue  . Demerol [Meperidine]   . Pioglitazone     Lips swelling  . Shrimp [Shellfish Allergy]     rash  . Sulfa Antibiotics       Review of Systems negative except from HPI and PMH  Physical Exam BP 136/84   Pulse 77   Ht 5\' 11"  (1.803 m)   Wt (!) 312 lb (141.5 kg)   SpO2 90%   BMI 43.52 kg/m  Well developed and nourished in no acute distress HENT normal Neck supple with JVP 7-8  Carotids brisk and full without bruits Clear Regular rate and rhythm, no murmurs or gallops Abd-soft with active BS without hepatomegaly No Clubbing cyanosis 1+  L>R edema Skin-warm and dry A & Oriented  Grossly normal sensory and motor function  ECG #1 demonstrated left bundle branch block. It turned out this was associated with some threshold left ventricular pacing.   ECG #2 with of increased output on the LV lead demonstrated a negative QRS in lead 1 and a biphasic QRS in lead V1   ECG #3 to demonstrated negative QRS in lead 1 and upright QRS in lead V1     Assessment and  Plan  NICM  Aborted cardiac Arrest  VT-polymorphic /syncope   CRT-D implant  Hypertension  CHF chronic-systolic/diastolic  Morbidly obese  Blood pressure  Reasonably controlled;  this is important in the context of his hypertensive heart disease   No intercurrent Ventricular tachycardia  Euvolemic continue current meds  Extensive reprogram his device demonstrated a much lower threshold with pacing 2--coil.  This was also associated with an inverted QRS in lead 1 and upright QRS in lead V1 This was associated with some degree of diaphragmatic stimulation at an output of 2.5 V and was decreased to 1 V without diaphragmatic stimulation.  He was encouraged with exercise and weight loss.

## 2016-09-08 LAB — CUP PACEART INCLINIC DEVICE CHECK
Brady Statistic RA Percent Paced: 0 %
Brady Statistic RV Percent Paced: 99 %
Implantable Lead Implant Date: 20160324
Implantable Lead Location: 753859
Implantable Lead Location: 753860
Implantable Lead Model: 7122
Lead Channel Impedance Value: 400 Ohm
Lead Channel Impedance Value: 425 Ohm
Lead Channel Impedance Value: 537.5 Ohm
Lead Channel Pacing Threshold Amplitude: 0.75 V
Lead Channel Pacing Threshold Amplitude: 0.75 V
Lead Channel Pacing Threshold Amplitude: 0.75 V
Lead Channel Sensing Intrinsic Amplitude: 12 mV
Lead Channel Setting Pacing Amplitude: 1 V
Lead Channel Setting Pacing Amplitude: 2 V
Lead Channel Setting Pacing Amplitude: 2.5 V
Lead Channel Setting Pacing Pulse Width: 0.5 ms
MDC IDC LEAD IMPLANT DT: 20160324
MDC IDC LEAD IMPLANT DT: 20160324
MDC IDC LEAD LOCATION: 753858
MDC IDC MSMT BATTERY REMAINING LONGEVITY: 66 mo
MDC IDC MSMT LEADCHNL LV PACING THRESHOLD PULSEWIDTH: 0.5 ms
MDC IDC MSMT LEADCHNL RA PACING THRESHOLD PULSEWIDTH: 0.5 ms
MDC IDC MSMT LEADCHNL RA SENSING INTR AMPL: 2.7 mV
MDC IDC MSMT LEADCHNL RV PACING THRESHOLD PULSEWIDTH: 0.5 ms
MDC IDC PG IMPLANT DT: 20160324
MDC IDC PG SERIAL: 7240350
MDC IDC SESS DTM: 20180612203027
MDC IDC SET LEADCHNL LV PACING PULSEWIDTH: 0.5 ms
MDC IDC SET LEADCHNL RV SENSING SENSITIVITY: 0.5 mV

## 2016-10-06 ENCOUNTER — Ambulatory Visit (INDEPENDENT_AMBULATORY_CARE_PROVIDER_SITE_OTHER): Payer: Medicare Other

## 2016-10-06 DIAGNOSIS — I5022 Chronic systolic (congestive) heart failure: Secondary | ICD-10-CM

## 2016-10-06 DIAGNOSIS — Z95810 Presence of automatic (implantable) cardiac defibrillator: Secondary | ICD-10-CM

## 2016-10-06 NOTE — Progress Notes (Signed)
EPIC Encounter for ICM Monitoring  Patient Name: Rickey Rice is a 76 y.o. male Date: 10/06/2016 Primary Care Physican: Lucianne Lei, MD Primary Cardiologist:[name] Electrophysiologist: [name] Dry Weight:308 lbs Bi-V Pacing: >99%       Heart Failure questions reviewed, pt asymptomatic.   Thoracic impedance normal but was abnormal from 08/16/2016 to 09/04/2016 (19 days) and 09/18/2016 to 09/27/2016 (9 days).  Prescribed dosage: Furosemide 40 mg 1 tablet daily  Recommendations: No changes.  Encouraged to call for fluid symptoms.  Follow-up plan: ICM clinic phone appointment on 11/06/2016.    Copy of ICM check sent to device physician.   3 month ICM trend: 10/06/2016   1 Year ICM trend:      Rosalene Billings, RN 10/06/2016 7:49 AM

## 2016-11-06 ENCOUNTER — Ambulatory Visit (INDEPENDENT_AMBULATORY_CARE_PROVIDER_SITE_OTHER): Payer: Medicare Other

## 2016-11-06 ENCOUNTER — Other Ambulatory Visit: Payer: Self-pay | Admitting: Internal Medicine

## 2016-11-06 DIAGNOSIS — Z95810 Presence of automatic (implantable) cardiac defibrillator: Secondary | ICD-10-CM | POA: Diagnosis not present

## 2016-11-06 DIAGNOSIS — I5022 Chronic systolic (congestive) heart failure: Secondary | ICD-10-CM | POA: Diagnosis not present

## 2016-11-06 NOTE — Progress Notes (Signed)
EPIC Encounter for ICM Monitoring  Patient Name: LLEWELYN SHEAFFER is a 76 y.o. male Date: 11/06/2016 Primary Care Physican: Lucianne Lei, MD Primary Cardiologist:[name] Electrophysiologist: Faustino Congress Weight:Unsure of current weight.  Last weight was 308 lbs Bi-V Pacing: >99%      Heart Failure questions reviewed, pt symptomatic with some some bloating and tiredness.   Thoracic impedance abnormal suggesting fluid accumulation since 10/30/2016.  Impedance also decreased 10/13/2016 to 10/24/2016.  He has been traveling during times of decreased impedance.   Prescribed dosage: Furosemide 40 mg 1 tablet daily  Labs: 07/18/2016 Creatinine 1.09, BUN 17, Potassium 4.2, Sodium 145, EGFR 76   Recommendations: Advised to increase Furosemide 40 mg to 1 tablet bid x 3 days and then return to prescribed dosage of 1 tablet daily.  Follow-up plan: ICM clinic phone appointment on 11/14/2016 to recheck fluid levels.  Copy of ICM check sent to cardiologist and device physician.   3 month ICM trend: 11/06/2016   1 Year ICM trend:      Rosalene Billings, RN 11/06/2016 8:31 AM

## 2016-11-14 ENCOUNTER — Ambulatory Visit (INDEPENDENT_AMBULATORY_CARE_PROVIDER_SITE_OTHER): Payer: Self-pay

## 2016-11-14 DIAGNOSIS — I5022 Chronic systolic (congestive) heart failure: Secondary | ICD-10-CM

## 2016-11-14 DIAGNOSIS — Z95810 Presence of automatic (implantable) cardiac defibrillator: Secondary | ICD-10-CM

## 2016-11-14 NOTE — Progress Notes (Signed)
EPIC Encounter for ICM Monitoring  Patient Name: Rickey Rice is a 76 y.o. male Date: 11/14/2016 Primary Care Physican: Lucianne Lei, MD Primary Cardiologist:[name] Electrophysiologist: [name] Dry Weight:308 lbs Bi-V Pacing: >99%             Heart Failure questions reviewed, pt asymptomatic.   Thoracic impedance returned to normal since 8/13 after taking extra Furosemide x 3 days.   Prescribed dosage: Furosemide 40 mg 1 tablet daily  Labs: 07/18/2016 Creatinine 1.09, BUN 17, Potassium 4.2, Sodium 145, EGFR 76  Recommendations: No changes.  Advised to limit salt intake to 2000 mg/day and fluid intake to < 2 liters/day.  Encouraged to call for fluid symptoms.  Follow-up plan: ICM clinic phone appointment on 12/07/2016.    Copy of ICM check sent to device physician.   3 month ICM trend: 11/14/2016   1 Year ICM trend:      Rosalene Billings, RN 11/14/2016 9:16 AM

## 2016-12-07 ENCOUNTER — Telehealth: Payer: Self-pay | Admitting: Cardiology

## 2016-12-07 ENCOUNTER — Ambulatory Visit (INDEPENDENT_AMBULATORY_CARE_PROVIDER_SITE_OTHER): Payer: Medicare Other | Admitting: *Deleted

## 2016-12-07 DIAGNOSIS — I5022 Chronic systolic (congestive) heart failure: Secondary | ICD-10-CM | POA: Diagnosis not present

## 2016-12-07 DIAGNOSIS — Z95810 Presence of automatic (implantable) cardiac defibrillator: Secondary | ICD-10-CM | POA: Diagnosis not present

## 2016-12-07 DIAGNOSIS — I428 Other cardiomyopathies: Secondary | ICD-10-CM

## 2016-12-07 NOTE — Progress Notes (Signed)
EPIC Encounter for ICM Monitoring  Patient Name: Rickey Rice is a 76 y.o. male Date: 12/07/2016 Primary Care Physican: Lucianne Lei, MD Primary Cardiologist:[name] Electrophysiologist: [name] Dry Weight: 308 lbs Bi-V Pacing: >99%      Heart Failure questions reviewed, pt asymptomatic.   Thoracic impedance normal.  Prescribed dosage: Furosemide 40 mg 1 tablet daily  Labs: 07/18/2016 Creatinine 1.09, BUN 17, Potassium 4.2, Sodium 145, EGFR 76  Recommendations: No changes.  Encouraged to call for fluid symptoms.  Follow-up plan: ICM clinic phone appointment on 01/09/2017.    Copy of ICM check sent to Dr. [name].   3 month ICM trend: 12/07/2016   1 Year ICM trend:      Rosalene Billings, RN 12/07/2016 4:27 PM

## 2016-12-07 NOTE — Telephone Encounter (Signed)
Spoke with pt and reminded pt of remote transmission that is due today. Pt verbalized understanding.   

## 2016-12-08 ENCOUNTER — Encounter: Payer: Self-pay | Admitting: Cardiology

## 2016-12-08 NOTE — Progress Notes (Signed)
Remote ICD transmission.   

## 2016-12-13 LAB — HM DIABETES EYE EXAM

## 2016-12-18 LAB — CUP PACEART REMOTE DEVICE CHECK
Battery Remaining Longevity: 66 mo
Battery Remaining Percentage: 69 %
Brady Statistic AS VP Percent: 99 %
Brady Statistic AS VS Percent: 1 %
HIGH POWER IMPEDANCE MEASURED VALUE: 59 Ohm
HighPow Impedance: 59 Ohm
Implantable Lead Implant Date: 20160324
Implantable Lead Implant Date: 20160324
Implantable Lead Implant Date: 20160324
Implantable Lead Location: 753858
Implantable Lead Location: 753859
Implantable Lead Model: 7122
Implantable Pulse Generator Implant Date: 20160324
Lead Channel Impedance Value: 530 Ohm
Lead Channel Pacing Threshold Amplitude: 0.75 V
Lead Channel Sensing Intrinsic Amplitude: 2.4 mV
Lead Channel Setting Pacing Amplitude: 2.5 V
Lead Channel Setting Pacing Pulse Width: 0.5 ms
Lead Channel Setting Pacing Pulse Width: 0.5 ms
MDC IDC LEAD LOCATION: 753860
MDC IDC MSMT BATTERY VOLTAGE: 2.99 V
MDC IDC MSMT LEADCHNL LV IMPEDANCE VALUE: 450 Ohm
MDC IDC MSMT LEADCHNL LV PACING THRESHOLD AMPLITUDE: 0.75 V
MDC IDC MSMT LEADCHNL LV PACING THRESHOLD PULSEWIDTH: 0.5 ms
MDC IDC MSMT LEADCHNL RA IMPEDANCE VALUE: 390 Ohm
MDC IDC MSMT LEADCHNL RA PACING THRESHOLD PULSEWIDTH: 0.5 ms
MDC IDC MSMT LEADCHNL RV PACING THRESHOLD AMPLITUDE: 0.75 V
MDC IDC MSMT LEADCHNL RV PACING THRESHOLD PULSEWIDTH: 0.5 ms
MDC IDC MSMT LEADCHNL RV SENSING INTR AMPL: 12 mV
MDC IDC SESS DTM: 20180913154817
MDC IDC SET LEADCHNL LV PACING AMPLITUDE: 1 V
MDC IDC SET LEADCHNL RA PACING AMPLITUDE: 2 V
MDC IDC SET LEADCHNL RV SENSING SENSITIVITY: 0.5 mV
MDC IDC STAT BRADY AP VP PERCENT: 1 %
MDC IDC STAT BRADY AP VS PERCENT: 1 %
MDC IDC STAT BRADY RA PERCENT PACED: 1 %
Pulse Gen Serial Number: 7240350

## 2017-01-09 ENCOUNTER — Ambulatory Visit (INDEPENDENT_AMBULATORY_CARE_PROVIDER_SITE_OTHER): Payer: Medicare Other

## 2017-01-09 DIAGNOSIS — I5022 Chronic systolic (congestive) heart failure: Secondary | ICD-10-CM

## 2017-01-09 DIAGNOSIS — Z95810 Presence of automatic (implantable) cardiac defibrillator: Secondary | ICD-10-CM | POA: Diagnosis not present

## 2017-01-09 NOTE — Progress Notes (Signed)
EPIC Encounter for ICM Monitoring  Patient Name: Rickey Rice is a 76 y.o. male Date: 01/09/2017 Primary Care Physican: Lucianne Lei, MD Primary Cardiologist:[name] Electrophysiologist: [name] Dry Weight: 306 lbs Bi-V Pacing: >99%       Heart Failure questions reviewed, pt asymptomatic.   Thoracic impedance normal.  Prescribed dosage: Furosemide 40 mg 1 tablet daily.  Labs: 07/18/2016 Creatinine 1.09, BUN 17, Potassium 4.2, Sodium 145, EGFR 76  Recommendations: No changes.   Encouraged to call for fluid symptoms.  Follow-up plan: ICM clinic phone appointment on 02/09/2017.   Copy of ICM check sent to Dr. [name].   3 month ICM trend: 01/09/2017   1 Year ICM trend:      Rosalene Billings, RN 01/09/2017 11:50 AM

## 2017-02-06 ENCOUNTER — Other Ambulatory Visit: Payer: Self-pay | Admitting: Gastroenterology

## 2017-02-06 ENCOUNTER — Telehealth: Payer: Self-pay

## 2017-02-06 ENCOUNTER — Other Ambulatory Visit: Payer: Self-pay | Admitting: *Deleted

## 2017-02-06 MED ORDER — FUROSEMIDE 40 MG PO TABS: ORAL_TABLET | ORAL | 0 refills | Status: DC

## 2017-02-06 NOTE — Telephone Encounter (Signed)
      [facility] Pre-operative Risk Assessment    Request for surgical clearance:  1. What type of surgery is being performed?  COLONOSCOPY   2. When is this surgery scheduled? 04-05-17   3. Are there any medications that need to be held prior to surgery and how long? NONE LISTED   4. Practice name and name of physician performing surgery? [facility]  5. What is your office phone and fax number? [phone] FX [phone]   6. Anesthesia type (None, local, MAC, general) ?  WITH PROPOFOL CARDIAC CLEARANCE FOR ANESTHESIA  Rickey Rice 02/06/2017, 4:58 PM  _________________________________________________________________   (provider comments below)

## 2017-02-07 NOTE — Telephone Encounter (Signed)
    Chart reviewed as part of pre-operative protocol coverage. Patient was contacted 02/07/2017 in reference to pre-operative risk assessment for pending surgery as outlined below.  Rickey Rice was last seen on 09/05/16  by Dr. [name] & 05/29/16 by Dr. Debara Pickett. Since that day, Rickey Rice has done well. Goes to [facility] few times/week. No issue. DASI in 6.61 METS.   Therefore, based on ACC/AHA guidelines, the patient would be at acceptable risk for the planned procedure without further cardiovascular testing.   Will route to Dr. Debara Pickett to review aspirin.   [place], PA 02/07/2017, 2:12 PM

## 2017-02-07 NOTE — Telephone Encounter (Signed)
Ok to hold aspirin for 7 days prior to surgery and resume afterwards.  Dr. Lemmie Evens

## 2017-02-09 ENCOUNTER — Ambulatory Visit (INDEPENDENT_AMBULATORY_CARE_PROVIDER_SITE_OTHER): Payer: Medicare Other

## 2017-02-09 DIAGNOSIS — Z95810 Presence of automatic (implantable) cardiac defibrillator: Secondary | ICD-10-CM | POA: Diagnosis not present

## 2017-02-09 DIAGNOSIS — I5022 Chronic systolic (congestive) heart failure: Secondary | ICD-10-CM

## 2017-02-09 NOTE — Progress Notes (Signed)
EPIC Encounter for ICM Monitoring  Patient Name: Rickey Rice is a 76 y.o. male Date: 02/09/2017 Primary Care Physican: Lucianne Lei, MD Primary Cardiologist:[name] Electrophysiologist: [name] Dry Weight: 315 lbs Bi-V Pacing: >99%      Heart Failure questions reviewed, pt asymptomatic.   Thoracic impedance close to baseline normal.  Prescribed dosage: Furosemide 40 mg 1 tablet daily.  Labs:  10/26/2016 Creatinine 1.22, BUN 20, Potassium 4.1, Sodium 139, EGFR 56->60 06/28/2016 Creatinine 1.09, BUN 17, Potassium 4.2, Sodium 145, EGFR 76  Recommendations: No changes.  Encouraged to call for fluid symptoms.  Follow-up plan: ICM clinic phone appointment on 03/12/2017.    Copy of ICM check sent to Dr. [name].   3 month ICM trend: 02/09/2017    1 Year ICM trend:       Rosalene Billings, RN 02/09/2017 9:11 AM

## 2017-03-12 ENCOUNTER — Telehealth: Payer: Self-pay | Admitting: Cardiology

## 2017-03-12 ENCOUNTER — Ambulatory Visit (INDEPENDENT_AMBULATORY_CARE_PROVIDER_SITE_OTHER): Payer: Medicare Other | Admitting: *Deleted

## 2017-03-12 DIAGNOSIS — I5022 Chronic systolic (congestive) heart failure: Secondary | ICD-10-CM | POA: Diagnosis not present

## 2017-03-12 DIAGNOSIS — I428 Other cardiomyopathies: Secondary | ICD-10-CM | POA: Diagnosis not present

## 2017-03-12 DIAGNOSIS — Z95810 Presence of automatic (implantable) cardiac defibrillator: Secondary | ICD-10-CM

## 2017-03-12 NOTE — Telephone Encounter (Signed)
Spoke with pt and reminded pt of remote transmission that is due today. Pt verbalized understanding.   

## 2017-03-13 NOTE — Progress Notes (Signed)
Remote ICD transmission.   

## 2017-03-14 ENCOUNTER — Encounter: Payer: Self-pay | Admitting: Cardiology

## 2017-03-14 LAB — CUP PACEART REMOTE DEVICE CHECK
Battery Remaining Longevity: 64 mo
Battery Remaining Percentage: 66 %
Brady Statistic AS VP Percent: 99 %
Brady Statistic AS VS Percent: 1 %
Date Time Interrogation Session: 20181217221758
HIGH POWER IMPEDANCE MEASURED VALUE: 54 Ohm
HIGH POWER IMPEDANCE MEASURED VALUE: 54 Ohm
Implantable Lead Implant Date: 20160324
Implantable Lead Location: 753858
Implantable Lead Model: 7122
Implantable Pulse Generator Implant Date: 20160324
Lead Channel Impedance Value: 450 Ohm
Lead Channel Pacing Threshold Amplitude: 0.75 V
Lead Channel Pacing Threshold Pulse Width: 0.5 ms
Lead Channel Sensing Intrinsic Amplitude: 12 mV
Lead Channel Sensing Intrinsic Amplitude: 2.5 mV
Lead Channel Setting Pacing Amplitude: 1 V
Lead Channel Setting Pacing Amplitude: 2.5 V
Lead Channel Setting Sensing Sensitivity: 0.5 mV
MDC IDC LEAD IMPLANT DT: 20160324
MDC IDC LEAD IMPLANT DT: 20160324
MDC IDC LEAD LOCATION: 753859
MDC IDC LEAD LOCATION: 753860
MDC IDC MSMT BATTERY VOLTAGE: 2.99 V
MDC IDC MSMT LEADCHNL LV PACING THRESHOLD AMPLITUDE: 0.75 V
MDC IDC MSMT LEADCHNL LV PACING THRESHOLD PULSEWIDTH: 0.5 ms
MDC IDC MSMT LEADCHNL RA IMPEDANCE VALUE: 400 Ohm
MDC IDC MSMT LEADCHNL RV IMPEDANCE VALUE: 550 Ohm
MDC IDC MSMT LEADCHNL RV PACING THRESHOLD AMPLITUDE: 0.75 V
MDC IDC MSMT LEADCHNL RV PACING THRESHOLD PULSEWIDTH: 0.5 ms
MDC IDC SET LEADCHNL LV PACING PULSEWIDTH: 0.5 ms
MDC IDC SET LEADCHNL RA PACING AMPLITUDE: 2 V
MDC IDC SET LEADCHNL RV PACING PULSEWIDTH: 0.5 ms
MDC IDC STAT BRADY AP VP PERCENT: 1 %
MDC IDC STAT BRADY AP VS PERCENT: 1 %
MDC IDC STAT BRADY RA PERCENT PACED: 1 %
Pulse Gen Serial Number: 7240350

## 2017-03-15 ENCOUNTER — Telehealth: Payer: Self-pay

## 2017-03-15 NOTE — Progress Notes (Signed)
EPIC Encounter for ICM Monitoring  Patient Name: Rickey Rice is a 76 y.o. male Date: 03/15/2017 Primary Care Physican: Lucianne Lei, MD Primary Cardiologist:[name] Electrophysiologist: [name] Dry Weight: Previous weight 315lbs Bi-V Pacing: >99%       Attempted call to patient and unable to reach.   Transmission reviewed.    Thoracic impedance abnormal suggesting fluid accumulation but starting to trend back toward baseline.  Prescribed dosage: Furosemide 40 mg 1 tablet daily.  Labs:  10/26/2016 Creatinine 1.22, BUN 20, Potassium 4.1, Sodium 139, EGFR 56->60 06/28/2016 Creatinine 1.09, BUN 17, Potassium 4.2, Sodium 145, EGFR 76  Recommendations: NONE - Unable to reach.  Follow-up plan: ICM clinic phone appointment on 03/29/2017 to recheck fluid levels.    Copy of ICM check sent to Dr. [name] and Dr. Debara Pickett.   3 month ICM trend: 03/15/2017    1 Year ICM trend:       Rosalene Billings, RN 03/15/2017 3:58 PM

## 2017-03-15 NOTE — Progress Notes (Signed)
Received: Today  Message Contents  [name], Nadean Corwin, MD  Kaylaann Mountz Panda, RN        Thanks .Marland Kitchen We'll continue to monitor.

## 2017-03-15 NOTE — Telephone Encounter (Signed)
Remote ICM transmission received.  Attempted call to patient and no answer   

## 2017-03-22 ENCOUNTER — Encounter (HOSPITAL_COMMUNITY): Payer: Self-pay

## 2017-03-28 ENCOUNTER — Encounter (HOSPITAL_COMMUNITY): Payer: Self-pay | Admitting: *Deleted

## 2017-03-28 ENCOUNTER — Other Ambulatory Visit: Payer: Self-pay

## 2017-03-29 ENCOUNTER — Telehealth: Payer: Self-pay | Admitting: Cardiology

## 2017-03-29 ENCOUNTER — Ambulatory Visit (INDEPENDENT_AMBULATORY_CARE_PROVIDER_SITE_OTHER): Payer: Self-pay

## 2017-03-29 DIAGNOSIS — Z95810 Presence of automatic (implantable) cardiac defibrillator: Secondary | ICD-10-CM

## 2017-03-29 DIAGNOSIS — I5022 Chronic systolic (congestive) heart failure: Secondary | ICD-10-CM

## 2017-03-29 NOTE — Telephone Encounter (Signed)
LMOVM reminding pt to send remote transmission.   

## 2017-03-30 ENCOUNTER — Telehealth: Payer: Self-pay

## 2017-03-30 NOTE — Progress Notes (Signed)
EPIC Encounter for ICM Monitoring  Patient Name: Rickey Rice is a 77 y.o. male Date: 03/30/2017 Primary Care Physican: Lucianne Lei, MD Primary Cardiologist:[name] Electrophysiologist: [name] Dry Weight: Previous weight 315lbs Bi-V Pacing: >99%      Attempted call to patient and unable to reach.  Left detailed message regarding transmission.  Transmission reviewed.    Thoracic impedance returned to normal since last ICM remote transmission on 03/12/2017 but was abnormal suggesting fluid accumulation from 03/10/17 to 03/24/17 (14 days).  Prescribed dosage: Furosemide 40 mg 1 tablet daily.  Labs: 02/02/2016 Creatinine 1.24, BUN 22, Potassium 4.2, Sodium 143  10/26/2016 Creatinine 1.22, BUN 20, Potassium 4.1, Sodium 139, EGFR 56->60 06/28/2016 Creatinine 1.09, BUN 17, Potassium 4.2, Sodium 145, EGFR 76  Recommendations: Left voice mail with ICM number and encouraged to call if experiencing any fluid symptoms.  Follow-up plan: ICM clinic phone appointment on 04/19/2017.    Copy of ICM check sent to Dr. [name] and Dr Debara Pickett.   3 month ICM trend: 03/30/2017    1 Year ICM trend:       Rosalene Billings, RN 03/30/2017 11:59 AM

## 2017-03-30 NOTE — Telephone Encounter (Signed)
Remote ICM transmission received.  Attempted call to patient and left detailed message per DPR regarding transmission and next ICM scheduled for 04/19/2017.  Advised to return call for any fluid symptoms or questions.    

## 2017-04-05 ENCOUNTER — Ambulatory Visit (HOSPITAL_COMMUNITY): Payer: Medicare Other | Admitting: Certified Registered Nurse Anesthetist

## 2017-04-05 ENCOUNTER — Encounter (HOSPITAL_COMMUNITY)
Admission: RE | Disposition: A | Discharge status: Home / Self Care | Payer: Self-pay | Source: Ambulatory Visit | Attending: Gastroenterology

## 2017-04-05 ENCOUNTER — Other Ambulatory Visit: Payer: Self-pay

## 2017-04-05 ENCOUNTER — Ambulatory Visit (HOSPITAL_COMMUNITY)
Admission: RE | Admit: 2017-04-05 | Discharge: 2017-04-05 | Disposition: A | Discharge status: Home / Self Care | Payer: Medicare Other | Source: Ambulatory Visit | Attending: Gastroenterology | Admitting: Gastroenterology

## 2017-04-05 ENCOUNTER — Encounter (HOSPITAL_COMMUNITY): Payer: Self-pay

## 2017-04-05 DIAGNOSIS — K5730 Diverticulosis of large intestine without perforation or abscess without bleeding: Secondary | ICD-10-CM | POA: Insufficient documentation

## 2017-04-05 DIAGNOSIS — E785 Hyperlipidemia, unspecified: Secondary | ICD-10-CM | POA: Diagnosis not present

## 2017-04-05 DIAGNOSIS — Z6841 Body Mass Index (BMI) 40.0 and over, adult: Secondary | ICD-10-CM | POA: Diagnosis not present

## 2017-04-05 DIAGNOSIS — I110 Hypertensive heart disease with heart failure: Secondary | ICD-10-CM | POA: Insufficient documentation

## 2017-04-05 DIAGNOSIS — I509 Heart failure, unspecified: Secondary | ICD-10-CM | POA: Insufficient documentation

## 2017-04-05 DIAGNOSIS — E039 Hypothyroidism, unspecified: Secondary | ICD-10-CM | POA: Insufficient documentation

## 2017-04-05 DIAGNOSIS — Z95810 Presence of automatic (implantable) cardiac defibrillator: Secondary | ICD-10-CM | POA: Diagnosis not present

## 2017-04-05 DIAGNOSIS — Z79899 Other long term (current) drug therapy: Secondary | ICD-10-CM | POA: Insufficient documentation

## 2017-04-05 DIAGNOSIS — Z885 Allergy status to narcotic agent status: Secondary | ICD-10-CM | POA: Diagnosis not present

## 2017-04-05 DIAGNOSIS — D123 Benign neoplasm of transverse colon: Secondary | ICD-10-CM | POA: Insufficient documentation

## 2017-04-05 DIAGNOSIS — E119 Type 2 diabetes mellitus without complications: Secondary | ICD-10-CM | POA: Diagnosis not present

## 2017-04-05 DIAGNOSIS — M1990 Unspecified osteoarthritis, unspecified site: Secondary | ICD-10-CM | POA: Diagnosis not present

## 2017-04-05 DIAGNOSIS — Z7982 Long term (current) use of aspirin: Secondary | ICD-10-CM | POA: Insufficient documentation

## 2017-04-05 DIAGNOSIS — Z882 Allergy status to sulfonamides status: Secondary | ICD-10-CM | POA: Insufficient documentation

## 2017-04-05 DIAGNOSIS — Z1211 Encounter for screening for malignant neoplasm of colon: Secondary | ICD-10-CM | POA: Insufficient documentation

## 2017-04-05 DIAGNOSIS — K648 Other hemorrhoids: Secondary | ICD-10-CM | POA: Insufficient documentation

## 2017-04-05 DIAGNOSIS — Z8719 Personal history of other diseases of the digestive system: Secondary | ICD-10-CM | POA: Diagnosis not present

## 2017-04-05 HISTORY — PX: COLONOSCOPY WITH PROPOFOL: SHX5780

## 2017-04-05 HISTORY — DX: Presence of automatic (implantable) cardiac defibrillator: Z95.810

## 2017-04-05 LAB — GLUCOSE, CAPILLARY: Glucose-Capillary: 85 mg/dL (ref 65–99)

## 2017-04-05 SURGERY — COLONOSCOPY WITH PROPOFOL
Anesthesia: Monitor Anesthesia Care

## 2017-04-05 MED ORDER — ONDANSETRON HCL 4 MG/2ML IJ SOLN
INTRAMUSCULAR | Status: DC | PRN
  Administered 2017-04-05: 4 mg via INTRAVENOUS

## 2017-04-05 MED ORDER — SODIUM CHLORIDE 0.9 % IV SOLN: INTRAVENOUS | Status: DC

## 2017-04-05 MED ORDER — LACTATED RINGERS IV SOLN
INTRAVENOUS | Status: DC
  Administered 2017-04-05: 07:00:00 via INTRAVENOUS

## 2017-04-05 MED ORDER — LIDOCAINE 2% (20 MG/ML) 5 ML SYRINGE
INTRAMUSCULAR | Status: DC | PRN
  Administered 2017-04-05: 100 mg via INTRAVENOUS

## 2017-04-05 MED ORDER — PROPOFOL 500 MG/50ML IV EMUL
INTRAVENOUS | Status: DC | PRN
  Administered 2017-04-05: 50 ug/kg/min via INTRAVENOUS

## 2017-04-05 MED ORDER — PROPOFOL 10 MG/ML IV BOLUS
INTRAVENOUS | Status: DC | PRN
  Administered 2017-04-05 (×4): 10 mg via INTRAVENOUS
  Administered 2017-04-05: 20 mg via INTRAVENOUS

## 2017-04-05 MED ORDER — PROPOFOL 10 MG/ML IV BOLUS
INTRAVENOUS | Status: AC
  Filled 2017-04-05: qty 60

## 2017-04-05 SURGICAL SUPPLY — 21 items

## 2017-04-05 NOTE — Discharge Instructions (Signed)

## 2017-04-05 NOTE — Anesthesia Preprocedure Evaluation (Signed)
Anesthesia Evaluation  Patient identified by MRN, date of birth, ID band Patient awake    Reviewed: Allergy & Precautions, H&P , NPO status , Patient's Chart, lab work & pertinent test results  Airway Mallampati: II   Neck ROM: full    Dental   Pulmonary neg pulmonary ROS,    breath sounds clear to auscultation       Cardiovascular hypertension, +CHF  + dysrhythmias + Cardiac Defibrillator  Rhythm:regular Rate:Normal  EF 30-35%   Neuro/Psych    GI/Hepatic   Endo/Other  diabetes, Type 2Hypothyroidism Morbid obesity  Renal/GU      Musculoskeletal  (+) Arthritis ,   Abdominal   Peds  Hematology   Anesthesia Other Findings   Reproductive/Obstetrics                             Anesthesia Physical Anesthesia Plan  ASA: III  Anesthesia Plan: MAC   Post-op Pain Management:    Induction: Intravenous  PONV Risk Score and Plan: 1 and Ondansetron, Propofol infusion and Treatment may vary due to age or medical condition  Airway Management Planned: Simple Face Mask  Additional Equipment:   Intra-op Plan:   Post-operative Plan:   Informed Consent: I have reviewed the patients History and Physical, chart, labs and discussed the procedure including the risks, benefits and alternatives for the proposed anesthesia with the patient or authorized representative who has indicated his/her understanding and acceptance.     Plan Discussed with: CRNA, Anesthesiologist and Surgeon  Anesthesia Plan Comments:         Anesthesia Quick Evaluation

## 2017-04-05 NOTE — Op Note (Signed)
[facility] Patient Name: Rickey Rice Procedure Date: 04/05/2017 MRN: [id number] Attending MD: Juanita Craver , MD Date of Birth: 1941-01-08 CSN: [id number] Age: 77 Admit Type: Outpatient Procedure:                Colonoscopy with hot snare polypectomy x 1. Indications:              Personal history of colonic polyps; CRC screening                            for colorectal malignant neoplasm. Providers:                Juanita Craver, MD, Cleda Daub, RN, Alan Mulder,                            Technician, Cathe Mons, CRNA. Referring MD:             Elyn Peers MD Medicines:                Monitored Anesthesia Care. Complications:            No immediate complications. Estimated Blood Loss:     Estimated blood loss: none. Procedure:                Pre-anesthesia assessment: - Prior to the                            procedure, a history and physical was performed,                            and patient medications and allergies were                            reviewed. The patient's tolerance of previous                            anesthesia was also reviewed. The risks and                            benefits of the procedure and the sedation options                            and risks were discussed with the patient. All                            questions were answered, and informed consent was                            obtained. Prior anticoagulants: The patient has                            taken aspirin, last dose was 2 days prior to                            procedure. ASA Grade assessment: IV - A patient  with severe systemic disease that is a constant                            threat to life. After reviewing the risks and                            benefits, the patient was deemed in satisfactory                            condition to undergo the procedure. After obtaining                            informed consent, the  colonoscope was passed under                            direct vision. Throughout the procedure, the                            patient's blood pressure, pulse, and oxygen                            saturations were monitored continuously. The                            EC-[id number] ([id number]) scope was introduced through                            the anus and advanced to the the ileocecal valve.                            The colonoscopy was extremely difficult due to                            significant looping, a tortuous colon and the                            patient's body habitus-morbid obesity. Completion                            of the procedure was aided by applying abdominal                            pressure and lavage. The patient tolerated the                            procedure well. The quality of the bowel                            preparation was fair. The ileocecal valve and the                            rectum were photographed. Scope In: 7:40:22 AM Scope Out: 8:09:30 AM Total Procedure Duration: 0 hours 29 minutes 8 seconds  Findings:  A 7 mm sessile polyp was found in the mid transverse colon; the polyp       was removed with a hot snare x 1-200/20; resection and retrieval were       complete.      Multiple small and large-mouthed diverticula were found in the entire       colon with more prominent changes in the sigmoid colon.      Small internal hemorrhoids were found during retroflexion.      Cecal base not visualized. Impression:               - Preparation of the colon was fair at best after                            multiple washes.                           - One 7 mm sessile polyp in the mid transverse                            colon, removed with a hot snare x 1; resected and                            retrieved.                           - Diverticulosis in the entire examined colon with                            more prominent changes in  the sigmoid colon.                           - Small internal hemorrhoids. Moderate Sedation:      MAC used. Recommendation:           - High fiber diet with augmented water consumption                            daily.                           - Continue present medications.                           - Await pathology results.                           - Return to my office PRN.                           - If the patient has any abnormal GI symptoms in                            the interim, he has been advised to call the office                            ASAP for further recommendations.                           -  Repeat colonoscopy is not recommended due to                            patients co-morbidities and his advanced age. Procedure Code(s):        --- Professional ---                           [phone], Colonoscopy, flexible; with removal of                            tumor(s), polyp(s), or other lesion(s) by snare                            technique Diagnosis Code(s):        --- Professional ---                           D12.3, Benign neoplasm of transverse colon (hepatic                            flexure or splenic flexure)                           Z86.010, Personal history of colonic polyps                           K57.30, Diverticulosis of large intestine without                            perforation or abscess without bleeding                           Z12.11, Encounter for screening for malignant                            neoplasm of colon CPT copyright 2016 American Medical Association. All rights reserved. The codes documented in this report are preliminary and upon coder review may  be revised to meet current compliance requirements. Juanita Craver, MD Juanita Craver, MD 04/05/2017 8:25:30 AM This report has been signed electronically. Number of Addenda: 0

## 2017-04-05 NOTE — Transfer of Care (Signed)
Immediate Anesthesia Transfer of Care Note  Patient: Rickey Rice  Procedure(s) Performed: COLONOSCOPY WITH PROPOFOL (N/A )  Patient Location: Endoscopy Unit  Anesthesia Type:MAC  Level of Consciousness: awake and alert   Airway & Oxygen Therapy: Patient Spontanous Breathing and Patient connected to face mask oxygen  Post-op Assessment: Report given to RN and Post -op Vital signs reviewed and stable  Post vital signs: Reviewed and stable  Last Vitals:  Vitals:   04/05/17 0640  BP: (!) 167/84  Pulse: 62  Resp: (!) 22  Temp: 36.6 C  SpO2: 100%    Last Pain:  Vitals:   04/05/17 0640  TempSrc: Oral         Complications: No apparent anesthesia complications

## 2017-04-05 NOTE — H&P (Signed)
Rickey Rice is an 77 y.o. male.   Chief Complaint: Colorectal cancer screening. HPI: 77 year old black male here for a screening colonoscopy. See office notes for details.  Past Medical History:  Diagnosis Date  . AICD (automatic cardioverter/defibrillator) present   . Arthritis    oa  . Cardiac arrest ([place]) 06/07/2014   Primary VF arrest with successful resuscitation and s/p ICD implant  . Diabetes mellitus without complication ([place])    type 2  . Dyslipidemia   . History of nuclear stress test 04/2012   lexiscan - 2 day protocol; low risk study, evidence of inferrior & apical scar but no ischemia   . Hypertension   . Hypothyroidism   . LBBB (left bundle branch block)   . Morbid obesity ([place])   . NICM (nonischemic cardiomyopathy) ([place])   . Prostate enlargement    pt denies   Past Surgical History:  Procedure Laterality Date  . BI-VENTRICULAR IMPLANTABLE CARDIOVERTER DEFIBRILLATOR N/A 06/18/2014   STJ CRTD implanted by Dr [name]  . COLONOSCOPY W/ BIOPSIES    . Rickey Rice   1 st joint  right hand amputated  . KNEE ARTHROSCOPY Left   . LEFT HEART CATHETERIZATION WITH CORONARY ANGIOGRAM N/A 06/12/2014   Procedure: LEFT HEART CATHETERIZATION WITH CORONARY ANGIOGRAM;  Surgeon: Leonie Man, MD;  Location: [facility] CATH LAB;  Service: Cardiovascular;  Laterality: N/A;  . [place]  . TOTAL KNEE ARTHROPLASTY Right 02/05/2013   Procedure: TOTAL KNEE ARTHROPLASTY;  Surgeon: Kerin Salen, MD;  Location: [facility];  Service: Orthopedics;  Laterality: Right;  . TOTAL KNEE ARTHROPLASTY Left 01/19/2014   Procedure: LEFT TOTAL KNEE ARTHROPLASTY;  Surgeon: Kerin Salen, MD;  Location: [facility];  Service: Orthopedics;  Laterality: Left;    Family History  Problem Relation Age of Onset  . Cancer Mother   . Kidney disease Brother   . Hypertension Sister    Social History:  reports that  has never smoked. he has never used smokeless tobacco. He reports that he drinks alcohol.  He reports that he does not use drugs.  Allergies:  Allergies  Allergen Reactions  . Bee Venom Swelling    Swelling on lips and tongue  . Demerol [Meperidine]     Not sure  . Pioglitazone     Lips swelling= actos  . Shrimp [Shellfish Allergy]     rash  . Sulfa Antibiotics     Not sure   Medications Prior to Admission  Medication Sig Dispense Refill  . acetaminophen (TYLENOL) 500 MG tablet Take 500 mg by mouth daily as needed for moderate pain or headache.    Marland Kitchen aspirin EC 81 MG tablet Take 81 mg by mouth daily.    . carboxymethylcellulose (REFRESH PLUS) 0.5 % SOLN Place 1 drop into both eyes daily as needed (dry eyes).    . carvedilol (COREG) 12.5 MG tablet Take 1 tablet (12.5 mg total) by mouth 2 (two) times daily. 180 tablet 3  . furosemide (LASIX) 40 MG tablet TAKE 1 TABLET(40 MG) BY MOUTH DAILY 90 tablet 0  . glimepiride (AMARYL) 4 MG tablet Take 8 mg by mouth daily before breakfast.     . levothyroxine (SYNTHROID, LEVOTHROID) 75 MCG tablet Take 75 mcg by mouth every evening. Monday through Saturday    . Multiple Vitamins-Minerals (ICAPS AREDS 2) CAPS Take 1 capsule by mouth 2 (two) times daily.    . Omega-3 Fatty Acids (OMEGA 3 PO) Take 1,600  mg by mouth every other day.    . rosuvastatin (CRESTOR) 10 MG tablet Take 1 tablet (10 mg total) by mouth daily at 6 PM. 30 tablet 0  . sacubitril-valsartan (ENTRESTO) 24-26 MG Take 1 tablet by mouth 2 (two) times daily. 60 tablet 11  . saxagliptin HCl (ONGLYZA) 5 MG TABS tablet Take 5 mg by mouth daily.    . TURMERIC CURCUMIN PO Take 1 tablet by mouth every other day.    Marland Kitchen amoxicillin (AMOXIL) 500 MG capsule Take 2,000 mg by mouth as directed. Prior to dental/periodontal appointments      Results for orders placed or performed during the hospital encounter of 04/05/17 (from the past 48 hour(s))  Glucose, capillary     Status: None   Collection Time: 04/05/17  6:54 AM  Result Value Ref Range   Glucose-Capillary 85 65 - 99 mg/dL   No  results found.  Review of Systems  Constitutional: Negative.   HENT: Negative.   Eyes: Negative.   Respiratory: Negative.   Cardiovascular: Negative.   Gastrointestinal: Positive for constipation.  Genitourinary: Negative.   Musculoskeletal: Positive for back pain and joint pain.  Skin: Negative.   Neurological: Negative.   Endo/Heme/Allergies: Negative.   Psychiatric/Behavioral: Negative.    Blood pressure (!) 167/84, pulse 62, temperature 97.8 F (36.6 C), temperature source Oral, resp. rate (!) 22, height 5\' 11"  (1.803 m), weight (!) 141.5 kg (312 lb), SpO2 100 %. Physical Exam  Constitutional: He is oriented to person, place, and time. He appears well-developed and well-nourished.  HENT:  Head: Normocephalic and atraumatic.  Eyes: Conjunctivae and EOM are normal. Pupils are equal, round, and reactive to light.  Neck: Normal range of motion. Neck supple.  Cardiovascular: Normal rate and regular rhythm.  Respiratory: Effort normal and breath sounds normal.  GI: Soft. Bowel sounds are normal.  Musculoskeletal: Normal range of motion.  Neurological: He is alert and oriented to person, place, and time.  Skin: Skin is warm and dry.  Psychiatric: He has a normal mood and affect. His behavior is normal. Judgment and thought content normal.    Assessment/Plan Colorectal cancer screening/personal history of colonic polyps-proceed with a colonoscopy at this time.  Sunny Gains, MD 04/05/2017, 7:25 AM

## 2017-04-05 NOTE — Anesthesia Postprocedure Evaluation (Signed)
Anesthesia Post Note  Patient: Rickey Rice  Procedure(s) Performed: COLONOSCOPY WITH PROPOFOL (N/A )     Patient location during evaluation: PACU Anesthesia Type: MAC Level of consciousness: awake and alert Pain management: pain level controlled Vital Signs Assessment: post-procedure vital signs reviewed and stable Respiratory status: spontaneous breathing, nonlabored ventilation, respiratory function stable and patient connected to nasal cannula oxygen Cardiovascular status: stable and blood pressure returned to baseline Postop Assessment: no apparent nausea or vomiting Anesthetic complications: no    Last Vitals:  Vitals:   04/05/17 0835 04/05/17 0840  BP: (!) 145/73   Pulse: (!) 53 (!) 56  Resp: 14 17  Temp:    SpO2: 98% 98%    Last Pain:  Vitals:   04/05/17 0821  TempSrc: Oral                 Kanav Kazmierczak S

## 2017-04-06 ENCOUNTER — Encounter (HOSPITAL_COMMUNITY): Payer: Self-pay | Admitting: Gastroenterology

## 2017-04-19 ENCOUNTER — Telehealth: Payer: Self-pay | Admitting: Cardiology

## 2017-04-19 NOTE — Telephone Encounter (Signed)
LMOVM reminding pt to send remote transmission.   

## 2017-04-23 ENCOUNTER — Telehealth: Payer: Self-pay

## 2017-04-23 ENCOUNTER — Ambulatory Visit (INDEPENDENT_AMBULATORY_CARE_PROVIDER_SITE_OTHER): Payer: Medicare Other

## 2017-04-23 DIAGNOSIS — I5022 Chronic systolic (congestive) heart failure: Secondary | ICD-10-CM | POA: Diagnosis not present

## 2017-04-23 DIAGNOSIS — Z95810 Presence of automatic (implantable) cardiac defibrillator: Secondary | ICD-10-CM

## 2017-04-23 NOTE — Telephone Encounter (Signed)
Remote ICM transmission received.  Attempted call to patient and no answer or answering machine.  

## 2017-04-23 NOTE — Progress Notes (Signed)
EPIC Encounter for ICM Monitoring  Patient Name: Rickey Rice is a 77 y.o. male Date: 04/23/2017 Primary Care Physican: Bland, Veita, MD Primary Cardiologist:[name] Electrophysiologist: Klein Dry Weight: Previous weight315lbs Bi-V Pacing: >99%        Attempted call to patient and unable to reach.    Transmission reviewed.    Thoracic impedance normal.  Prescribed dosage: Furosemide 40 mg 1 tablet daily.  Labs: 07/18/2016 Creatinine 1.09, BUN 17, Potassium 4.2, Sodium 145, EGR 76  02/02/2016 Creatinine 1.24, BUN 22, Potassium 4.2, Sodium 143  10/27/2015 Creatinine 1.22, BUN 20, Potassium 4.1, Sodium 139, EGFR 56->60 06/29/2015 Creatinine 1.09, BUN 17, Potassium 4.2, Sodium 145, EGFR 76  Recommendations: NONE - Unable to reach.  Follow-up plan: ICM clinic phone appointment on 05/24/2017.    Copy of ICM check sent to Dr. Klein.   3 month ICM trend: 04/23/2017    1 Year ICM trend:       Laurie S Short, RN 04/23/2017 1:39 PM   

## 2017-05-06 ENCOUNTER — Other Ambulatory Visit: Payer: Self-pay | Admitting: Internal Medicine

## 2017-05-07 NOTE — Telephone Encounter (Signed)
REFILL 

## 2017-05-16 DIAGNOSIS — H903 Sensorineural hearing loss, bilateral: Secondary | ICD-10-CM | POA: Insufficient documentation

## 2017-05-16 DIAGNOSIS — H6123 Impacted cerumen, bilateral: Secondary | ICD-10-CM | POA: Insufficient documentation

## 2017-05-24 ENCOUNTER — Ambulatory Visit (INDEPENDENT_AMBULATORY_CARE_PROVIDER_SITE_OTHER): Payer: Medicare Other

## 2017-05-24 DIAGNOSIS — Z95810 Presence of automatic (implantable) cardiac defibrillator: Secondary | ICD-10-CM

## 2017-05-24 DIAGNOSIS — I5022 Chronic systolic (congestive) heart failure: Secondary | ICD-10-CM

## 2017-05-24 NOTE — Progress Notes (Signed)
EPIC Encounter for ICM Monitoring  Patient Name: Rickey Rice is a 77 y.o. male Date: 05/24/2017 Primary Care Physican: Lucianne Lei, MD Primary Cardiologist:[name] Electrophysiologist: [name] Dry Weight: 315lbs Bi-V Pacing: >99%      Heart Failure questions reviewed, pt asymptomatic.  He did not take take Furosemide for a couple of days due to traveling.    Thoracic impedance slightly below baseline suggesting fluid accumulation since 05/23/2017.  Prescribed dosage: Furosemide 40 mg 1 tablet daily.  Labs: 07/18/2016 Creatinine 1.09, BUN 17, Potassium 4.2, Sodium 145, EGR 76  02/02/2016 Creatinine1.24, BUN 22, Potassium 4.2, Sodium 143 10/27/2015 Creatinine 1.22, BUN 20, Potassium 4.1, Sodium 139, EGFR 56->60 06/29/2015 Creatinine 1.09, BUN 17, Potassium 4.2, Sodium 145, EGFR 76  Recommendations: No changes.  Advised to limit salt intake to 2000 mg/day.  Encouraged to call for fluid symptoms.  Follow-up plan: ICM clinic phone appointment on 06/01/2017 to recheck fluid levels.    Copy of ICM check sent to Dr. [name].   3 month ICM trend: 05/24/2017    1 Year ICM trend:       Rosalene Billings, RN 05/24/2017 8:03 AM

## 2017-06-01 ENCOUNTER — Ambulatory Visit (INDEPENDENT_AMBULATORY_CARE_PROVIDER_SITE_OTHER): Payer: Self-pay

## 2017-06-01 DIAGNOSIS — Z95810 Presence of automatic (implantable) cardiac defibrillator: Secondary | ICD-10-CM

## 2017-06-01 DIAGNOSIS — I5022 Chronic systolic (congestive) heart failure: Secondary | ICD-10-CM

## 2017-06-01 NOTE — Progress Notes (Signed)
EPIC Encounter for ICM Monitoring  Patient Name: Rickey Rice is a 77 y.o. male Date: 06/01/2017 Primary Care Physican: Lucianne Lei, MD Primary Cardiologist:[name] Electrophysiologist: [name] Dry Weight: 315lbs Bi-V Pacing: >99%      Heart Failure questions reviewed, pt asymptomatic.   Thoracic impedance normal.  Prescribed dosage: Furosemide 40 mg 1 tablet daily.  Labs: 07/18/2016 Creatinine 1.09, BUN 17, Potassium 4.2, Sodium 145, EGR 76 02/02/2016 Creatinine1.24, BUN 22, Potassium 4.2, Sodium 143 08/02/2017Creatinine 1.22, BUN 20, Potassium 4.1, Sodium 139, EGFR 56->60 04/04/2017Creatinine 1.09, BUN 17, Potassium 4.2, Sodium 145, EGFR 76  Recommendations: No changes.  Advised to limit salt intake to 2000 mg/day and fluid intake to < 2 liters/day.  Encouraged to call for fluid symptoms.  Follow-up plan: ICM clinic phone appointment on 06/25/2017.    Copy of ICM check sent to Dr. [name].   3 month ICM trend: 06/01/2017    Direct Trend Viewer         1 Year ICM trend:       Rosalene Billings, RN 06/01/2017 11:15 AM

## 2017-06-15 ENCOUNTER — Other Ambulatory Visit: Payer: Self-pay | Admitting: Internal Medicine

## 2017-06-15 NOTE — Telephone Encounter (Signed)
REFILL 

## 2017-06-25 ENCOUNTER — Ambulatory Visit (INDEPENDENT_AMBULATORY_CARE_PROVIDER_SITE_OTHER): Payer: Medicare Other | Admitting: *Deleted

## 2017-06-25 DIAGNOSIS — I428 Other cardiomyopathies: Secondary | ICD-10-CM

## 2017-06-25 DIAGNOSIS — I5022 Chronic systolic (congestive) heart failure: Secondary | ICD-10-CM | POA: Diagnosis not present

## 2017-06-25 DIAGNOSIS — Z95810 Presence of automatic (implantable) cardiac defibrillator: Secondary | ICD-10-CM

## 2017-06-25 NOTE — Progress Notes (Signed)
EPIC Encounter for ICM Monitoring  Patient Name: Rickey Rice is a 77 y.o. male Date: 06/25/2017 Primary Care Physican: Lucianne Lei, MD Primary Cardiologist:[name] Electrophysiologist: [name] Dry Weight: Previous weight 315lbs Bi-V Pacing: >99%       Heart Failure questions reviewed, pt asymptomatic.  The compression stockings he has are too tight and there is some leg swelling at the top.  He plans on buying new stockings.   Has not weighed in about 2 weeks.    Thoracic impedance normal but was abnormal suggesting fluid accumulation from 06/01/2017 to 06/10/2017 and 06/18/2017 to 06/23/2017 with exception of 1 day at baseline.  Prescribed dosage: Furosemide 40 mg 1 tablet daily.  Labs: 07/18/2016 Creatinine 1.09, BUN 17, Potassium 4.2, Sodium 145, EGR 76 02/02/2016 Creatinine1.24, BUN 22, Potassium 4.2, Sodium 143 08/02/2017Creatinine 1.22, BUN 20, Potassium 4.1, Sodium 139, EGFR 56->60 04/04/2017Creatinine 1.09, BUN 17, Potassium 4.2, Sodium 145, EGFR 76  Recommendations: No changes.  Advised to limit dietary salt intake.  Encouraged to call for fluid symptoms.  Follow-up plan: ICM clinic phone appointment on 07/26/2017.    Copy of ICM check sent to Dr. [name].   3 month ICM trend: 06/25/2017    1 Year ICM trend:       Rosalene Billings, RN 06/25/2017 11:20 AM

## 2017-06-25 NOTE — Progress Notes (Signed)
Remote ICD transmission.   

## 2017-06-26 ENCOUNTER — Encounter: Payer: Self-pay | Admitting: Cardiology

## 2017-07-10 LAB — CUP PACEART REMOTE DEVICE CHECK
Brady Statistic AP VP Percent: 1 %
Brady Statistic AP VS Percent: 1 %
HIGH POWER IMPEDANCE MEASURED VALUE: 64 Ohm
HighPow Impedance: 64 Ohm
Implantable Lead Implant Date: 20160324
Implantable Lead Implant Date: 20160324
Implantable Lead Location: 753858
Implantable Lead Location: 753859
Implantable Lead Location: 753860
Implantable Lead Model: 7122
Implantable Pulse Generator Implant Date: 20160324
Lead Channel Impedance Value: 390 Ohm
Lead Channel Impedance Value: 450 Ohm
Lead Channel Pacing Threshold Amplitude: 0.75 V
Lead Channel Pacing Threshold Pulse Width: 0.5 ms
Lead Channel Pacing Threshold Pulse Width: 0.5 ms
Lead Channel Setting Pacing Amplitude: 1 V
Lead Channel Setting Pacing Amplitude: 2.5 V
Lead Channel Setting Pacing Pulse Width: 0.5 ms
Lead Channel Setting Pacing Pulse Width: 0.5 ms
MDC IDC LEAD IMPLANT DT: 20160324
MDC IDC MSMT BATTERY REMAINING LONGEVITY: 61 mo
MDC IDC MSMT BATTERY REMAINING PERCENTAGE: 63 %
MDC IDC MSMT BATTERY VOLTAGE: 2.99 V
MDC IDC MSMT LEADCHNL LV PACING THRESHOLD AMPLITUDE: 0.75 V
MDC IDC MSMT LEADCHNL LV PACING THRESHOLD PULSEWIDTH: 0.5 ms
MDC IDC MSMT LEADCHNL RA SENSING INTR AMPL: 2.8 mV
MDC IDC MSMT LEADCHNL RV IMPEDANCE VALUE: 510 Ohm
MDC IDC MSMT LEADCHNL RV PACING THRESHOLD AMPLITUDE: 0.75 V
MDC IDC MSMT LEADCHNL RV SENSING INTR AMPL: 12 mV
MDC IDC SESS DTM: 20190401060022
MDC IDC SET LEADCHNL RA PACING AMPLITUDE: 2 V
MDC IDC SET LEADCHNL RV SENSING SENSITIVITY: 0.5 mV
MDC IDC STAT BRADY AS VP PERCENT: 99 %
MDC IDC STAT BRADY AS VS PERCENT: 1 %
MDC IDC STAT BRADY RA PERCENT PACED: 1 %
Pulse Gen Serial Number: 7240350

## 2017-07-26 ENCOUNTER — Telehealth: Payer: Self-pay | Admitting: Cardiology

## 2017-07-26 ENCOUNTER — Ambulatory Visit (INDEPENDENT_AMBULATORY_CARE_PROVIDER_SITE_OTHER): Payer: Medicare Other

## 2017-07-26 DIAGNOSIS — I5022 Chronic systolic (congestive) heart failure: Secondary | ICD-10-CM

## 2017-07-26 DIAGNOSIS — Z95810 Presence of automatic (implantable) cardiac defibrillator: Secondary | ICD-10-CM | POA: Diagnosis not present

## 2017-07-26 NOTE — Telephone Encounter (Signed)
Confirmed remote transmission w/ pt wife.   

## 2017-07-27 NOTE — Progress Notes (Signed)
EPIC Encounter for ICM Monitoring  Patient Name: Rickey Rice is a 77 y.o. male Date: 07/27/2017 Primary Care Physican: Lucianne Lei, MD Primary Cardiologist:[name] Electrophysiologist: [name] Dry Weight: 315lbs Bi-V Pacing: >99%       Heart Failure questions reviewed, pt asymptomatic.    Thoracic impedance normal but was abnormal suggesting fluid accumulation from 07/10/2017 - 07/17/2017.  Prescribed dosage: Furosemide 40 mg 1 tablet daily.  Labs: 07/18/2016 Creatinine 1.09, BUN 17, Potassium 4.2, Sodium 145, EGR 76  Recommendations: No changes.   Encouraged to call for fluid symptoms.  Follow-up plan: ICM clinic phone appointment on 08/27/2017.  Office appointment scheduled 09/19/2017 with Dr. [name].  Copy of ICM check sent to Dr. [name].   3 month ICM trend: 07/27/2017    1 Year ICM trend:       Rosalene Billings, RN 07/27/2017 8:07 AM

## 2017-08-11 ENCOUNTER — Other Ambulatory Visit: Payer: Self-pay | Admitting: Internal Medicine

## 2017-08-27 ENCOUNTER — Ambulatory Visit (INDEPENDENT_AMBULATORY_CARE_PROVIDER_SITE_OTHER): Payer: Medicare Other

## 2017-08-27 ENCOUNTER — Telehealth: Payer: Self-pay

## 2017-08-27 DIAGNOSIS — I5022 Chronic systolic (congestive) heart failure: Secondary | ICD-10-CM

## 2017-08-27 DIAGNOSIS — Z95810 Presence of automatic (implantable) cardiac defibrillator: Secondary | ICD-10-CM | POA: Diagnosis not present

## 2017-08-27 NOTE — Progress Notes (Signed)
EPIC Encounter for ICM Monitoring  Patient Name: KRYSTAL DELDUCA is a 77 y.o. male Date: 08/27/2017 Primary Care Physican: Lucianne Lei, MD Primary Cardiologist:[name] Electrophysiologist: [name] Dry Weight:Previous weight 315lbs Bi-V Pacing: >99%     Heart Failure questions reviewed, pt asymptomatic.   Thoracic impedance normal.  Prescribed dosage: Furosemide 40 mg 1 tablet daily.  Labs: 07/18/2016 Creatinine 1.09, BUN 17, Potassium 4.2, Sodium 145, EGR 76  Recommendations: No changes.  Encouraged to call for fluid symptoms.  Follow-up plan: ICM clinic phone appointment on 10/25/2017 since he has an office appointment scheduled 09/19/2017 with Dr. [name].  Copy of ICM check sent to Dr. [name].   3 month ICM trend: 08/27/2017    1 Year ICM trend:       Rosalene Billings, RN 08/27/2017 10:16 AM

## 2017-08-27 NOTE — Telephone Encounter (Signed)
Spoke with pt and reminded pt of remote transmission that is due today. Pt verbalized understanding.   

## 2017-08-28 ENCOUNTER — Ambulatory Visit: Payer: Medicare Other | Admitting: Urology

## 2017-08-28 DIAGNOSIS — N400 Enlarged prostate without lower urinary tract symptoms: Secondary | ICD-10-CM | POA: Diagnosis not present

## 2017-09-10 ENCOUNTER — Other Ambulatory Visit: Payer: Self-pay | Admitting: Internal Medicine

## 2017-09-19 ENCOUNTER — Encounter: Payer: Self-pay | Admitting: Internal Medicine

## 2017-09-19 ENCOUNTER — Ambulatory Visit: Payer: Medicare Other | Admitting: Internal Medicine

## 2017-09-19 VITALS — BP 120/82 | HR 65 | Ht 71.0 in | Wt 320.4 lb

## 2017-09-19 DIAGNOSIS — I472 Ventricular tachycardia: Secondary | ICD-10-CM

## 2017-09-19 DIAGNOSIS — Z95810 Presence of automatic (implantable) cardiac defibrillator: Secondary | ICD-10-CM | POA: Diagnosis not present

## 2017-09-19 DIAGNOSIS — I4729 Other ventricular tachycardia: Secondary | ICD-10-CM

## 2017-09-19 DIAGNOSIS — I5022 Chronic systolic (congestive) heart failure: Secondary | ICD-10-CM | POA: Diagnosis not present

## 2017-09-19 DIAGNOSIS — I428 Other cardiomyopathies: Secondary | ICD-10-CM | POA: Diagnosis not present

## 2017-09-19 NOTE — Progress Notes (Signed)
Patient Care Team: Lucianne Lei, MD as PCP - General (Family Medicine)   HPI  Rickey Rice is a 77 y.o. male Seen in follow-up for CRT-D implantation for aborted cardiac arrest in the setting of nonischemic cardiac myopathy and left bundle branch block.  Because of a short RV LV interval, it was ELECTED NOT to activate the LV leads  initially. We subsequently have  turned on the LV lead      Date Cr K Mg  11/17  1.24 4.2  1.9           DATE TEST    3/16 LHC  No obstructive CAD  3/16    Echo   EF 40 %   8/17    Echo   EF 30-35 %         He has not been feeling good these last few months.  He got the flu.  Had a significant amount of lassitude.  He has some peripheral edema.  He acknowledges that he had some depression earlier this winter.  He is not exercising.    He snores and has sleep disordered breathing  Past Medical History:  Diagnosis Date  . AICD (automatic cardioverter/defibrillator) present   . Arthritis    oa  . Cardiac arrest ([place]) 06/07/2014   Primary VF arrest with successful resuscitation and s/p ICD implant  . Diabetes mellitus without complication ([place])    type 2  . Dyslipidemia   . History of nuclear stress test 04/2012   lexiscan - 2 day protocol; low risk study, evidence of inferrior & apical scar but no ischemia   . Hypertension   . Hypothyroidism   . LBBB (left bundle branch block)   . Morbid obesity ([place])   . NICM (nonischemic cardiomyopathy) ([place])   . Prostate enlargement    pt denies    Past Surgical History:  Procedure Laterality Date  . BI-VENTRICULAR IMPLANTABLE CARDIOVERTER DEFIBRILLATOR N/A 06/18/2014   STJ CRTD implanted by Dr [name]  . COLONOSCOPY W/ BIOPSIES    . COLONOSCOPY WITH PROPOFOL N/A 04/05/2017   Procedure: COLONOSCOPY WITH PROPOFOL;  Surgeon: Juanita Craver, MD;  Location: [facility];  Service: Endoscopy;  Laterality: N/A;  . [place]   1 st joint  right hand amputated  . KNEE ARTHROSCOPY Left     . LEFT HEART CATHETERIZATION WITH CORONARY ANGIOGRAM N/A 06/12/2014   Procedure: LEFT HEART CATHETERIZATION WITH CORONARY ANGIOGRAM;  Surgeon: Leonie Man, MD;  Location: [facility] C[facility] LAB;  Service: Cardiovascular;  Laterality: N/A;  . [place]  . TOTAL KNEE ARTHROPLASTY Right 02/05/2013   Procedure: TOTAL KNEE ARTHROPLASTY;  Surgeon: Kerin Salen, MD;  Location: [place];  Service: Orthopedics;  Laterality: Right;  . TOTAL KNEE ARTHROPLASTY Left 01/19/2014   Procedure: LEFT TOTAL KNEE ARTHROPLASTY;  Surgeon: Kerin Salen, MD;  Location: [place];  Service: Orthopedics;  Laterality: Left;    Current Outpatient Medications  Medication Sig Dispense Refill  . acetaminophen (TYLENOL) 500 MG tablet Take 500 mg by mouth daily as needed for moderate pain or headache.    Marland Kitchen amoxicillin (AMOXIL) 500 MG capsule Take 2,000 mg by mouth as directed. Prior to dental/periodontal appointments    . aspirin EC 81 MG tablet Take 81 mg by mouth daily.    . carboxymethylcellulose (REFRESH PLUS) 0.5 % SOLN Place 1 drop into both eyes daily as needed (dry eyes).    . carvedilol (COREG) 12.5  MG tablet Take 1 tablet (12.5 mg total) by mouth 2 (two) times daily with a meal. Please keep upcoming appt in June for future refills. Thank you 180 tablet 0  . ENTRESTO 24-26 MG TAKE 1 TABLET BY MOUTH TWICE DAILY. 60 tablet 0  . furosemide (LASIX) 40 MG tablet TAKE 1 TABLET(40 MG) BY MOUTH DAILY 90 tablet 1  . glimepiride (AMARYL) 4 MG tablet Take 8 mg by mouth daily before breakfast.     . levothyroxine (SYNTHROID, LEVOTHROID) 75 MCG tablet Take 75 mcg by mouth every evening. Monday through Saturday    . Multiple Vitamins-Minerals (ICAPS AREDS 2) CAPS Take 1 capsule by mouth 2 (two) times daily.    . Omega-3 Fatty Acids (OMEGA 3 PO) Take 1,600 mg by mouth every other day.    . rosuvastatin (CRESTOR) 10 MG tablet Take 1 tablet (10 mg total) by mouth daily at 6 PM. 30 tablet 0  . saxagliptin HCl (ONGLYZA) 5 MG TABS tablet  Take 5 mg by mouth daily.    . TURMERIC CURCUMIN PO Take 1 tablet by mouth every other day.     No current facility-administered medications for this visit.     Allergies  Allergen Reactions  . Bee Venom Swelling    Swelling on lips and tongue  . Demerol [Meperidine]     Not sure  . Pioglitazone     Lips swelling= actos  . Shrimp [Shellfish Allergy]     rash  . Sulfa Antibiotics     Not sure      Review of Systems negative except from HPI and PMH  Physical Exam BP 120/82   Pulse 65   Ht 5\' 11"  (1.803 m)   Wt (!) 320 lb 6.4 oz (145.3 kg)   SpO2 97%   BMI 44.69 kg/m  Well developed and Morbidly obese  in no acute distress HENT normal Neck supple with JVP-flat Clear Regular rate and rhythm, no murmurs or gallops Abd-soft with active BS No Clubbing cyanosis edema Skin-warm and dry A & Oriented  Grossly normal sensory and motor function   ECG #1 demonstrated left bundle branch block. It turned out this was associated with some threshold left ventricular pacing.   ECG #2 with of increased output on the LV lead demonstrated a negative QRS in lead 1 and a biphasic QRS in lead V1   ECG #3 to demonstrated negative QRS in lead 1 and upright QRS in lead V1  ECG 6/19 sinus P synchronous pacing at 65 Interval 16/18/47 Negative QRS lead I and upright QRS lead V1   Assessment and  Plan  NICM  Aborted cardiac Arrest  VT-polymorphic /syncope   CRT-D implant  Hypertension  CHF chronic-systolic/diastolic  Depression  Sleep disordered breathing   He is mildly volume overloaded.  Discussed salt and fluid intake.  No interval arrhythmias.  Encouraged exercise.  Reviewed interval weight gain.  Discussed depression  We spent more than 50% of our >40 min visit in face to face counseling regarding the above   n

## 2017-09-19 NOTE — Patient Instructions (Addendum)
Medication Instructions:  Your physician recommends that you continue on your current medications as directed. Please refer to the Current Medication list given to you today.  Labwork: You will have labs drawn today: BMP   Testing/Procedures: Your physician has recommended that you have a sleep study. This test records several body functions during sleep, including: brain activity, eye movement, oxygen and carbon dioxide blood levels, heart rate and rhythm, breathing rate and rhythm, the flow of air through your mouth and nose, snoring, body muscle movements, and chest and belly movement.   Follow-Up: Your physician wants you to follow-up in: One Year with Dr [name]. You will receive a reminder letter in the mail two months in advance. If you don't receive a letter, please call our office to schedule the follow-up appointment.  Remote monitoring is used to monitor your ICD from home. This monitoring reduces the number of office visits required to check your device to one time per year. It allows [place] to keep an eye on the functioning of your device to ensure it is working properly. You are scheduled for a device check from home on 10/25/2017. You may send your transmission at any time that day. If you have a wireless device, the transmission will be sent automatically. After your physician reviews your transmission, you will receive a postcard with your next transmission date.    Any Other Special Instructions Will Be Listed Below (If Applicable).     If you need a refill on your cardiac medications before your next appointment, please call your pharmacy.

## 2017-09-20 LAB — CUP PACEART INCLINIC DEVICE CHECK
Battery Remaining Longevity: 55 mo
Brady Statistic RA Percent Paced: 0 %
HIGH POWER IMPEDANCE MEASURED VALUE: 58.5 Ohm
Implantable Lead Implant Date: 20160324
Implantable Lead Implant Date: 20160324
Implantable Lead Location: 753858
Implantable Lead Location: 753860
Lead Channel Impedance Value: 387.5 Ohm
Lead Channel Impedance Value: 512.5 Ohm
Lead Channel Pacing Threshold Amplitude: 0.75 V
Lead Channel Pacing Threshold Amplitude: 1 V
Lead Channel Pacing Threshold Pulse Width: 0.5 ms
Lead Channel Pacing Threshold Pulse Width: 0.5 ms
Lead Channel Pacing Threshold Pulse Width: 0.5 ms
Lead Channel Sensing Intrinsic Amplitude: 2.8 mV
Lead Channel Setting Pacing Amplitude: 2 V
Lead Channel Setting Pacing Pulse Width: 0.5 ms
Lead Channel Setting Sensing Sensitivity: 0.5 mV
MDC IDC LEAD IMPLANT DT: 20160324
MDC IDC LEAD LOCATION: 753859
MDC IDC MSMT LEADCHNL LV IMPEDANCE VALUE: 450 Ohm
MDC IDC MSMT LEADCHNL RV PACING THRESHOLD AMPLITUDE: 0.75 V
MDC IDC MSMT LEADCHNL RV SENSING INTR AMPL: 12 mV
MDC IDC PG IMPLANT DT: 20160324
MDC IDC PG SERIAL: 7240350
MDC IDC SESS DTM: 20190626162330
MDC IDC SET LEADCHNL LV PACING AMPLITUDE: 1 V
MDC IDC SET LEADCHNL RV PACING AMPLITUDE: 2.5 V
MDC IDC SET LEADCHNL RV PACING PULSEWIDTH: 0.5 ms

## 2017-09-20 LAB — BASIC METABOLIC PANEL
BUN/Creatinine Ratio: 15 (ref 10–24)
BUN: 18 mg/dL (ref 8–27)
CALCIUM: 9.5 mg/dL (ref 8.6–10.2)
CHLORIDE: 105 mmol/L (ref 96–106)
CO2: 31 mmol/L — AB (ref 20–29)
Creatinine, Ser: 1.2 mg/dL (ref 0.76–1.27)
GFR calc Af Amer: 67 mL/min/{1.73_m2} (ref >59)
GFR calc non Af Amer: 58 mL/min/{1.73_m2} — ABNORMAL LOW (ref >59)
GLUCOSE: 124 mg/dL — AB (ref 65–99)
Potassium: 4.2 mmol/L (ref 3.5–5.2)
Sodium: 147 mmol/L — ABNORMAL HIGH (ref 134–144)

## 2017-09-21 ENCOUNTER — Telehealth: Payer: Self-pay | Admitting: *Deleted

## 2017-09-21 NOTE — Telephone Encounter (Signed)
Notified patient's wife of sleep study appointment scheduled at [facility] on 09/30/17.

## 2017-09-21 NOTE — Telephone Encounter (Signed)
-----   Message from Dollene Primrose, RN sent at 09/19/2017  1:08 PM EDT ----- Regarding: split night study Hello  Pt needs pre approval and would like study done at [facility].

## 2017-10-07 ENCOUNTER — Ambulatory Visit: Payer: Medicare Other | Attending: Internal Medicine | Admitting: Cardiology

## 2017-10-07 DIAGNOSIS — I493 Ventricular premature depolarization: Secondary | ICD-10-CM | POA: Diagnosis not present

## 2017-10-07 DIAGNOSIS — I428 Other cardiomyopathies: Secondary | ICD-10-CM

## 2017-10-07 DIAGNOSIS — Z95810 Presence of automatic (implantable) cardiac defibrillator: Secondary | ICD-10-CM

## 2017-10-07 DIAGNOSIS — G4733 Obstructive sleep apnea (adult) (pediatric): Secondary | ICD-10-CM | POA: Diagnosis not present

## 2017-10-07 DIAGNOSIS — I5022 Chronic systolic (congestive) heart failure: Secondary | ICD-10-CM

## 2017-10-07 DIAGNOSIS — I472 Ventricular tachycardia: Secondary | ICD-10-CM

## 2017-10-07 DIAGNOSIS — I4729 Other ventricular tachycardia: Secondary | ICD-10-CM

## 2017-10-08 NOTE — Procedures (Signed)
Patient Name: Rickey Rice, Rickey Rice Date: 10/07/2017   Gender: Male  D.O.B: 1940/12/21  Age (years): 77  Referring Provider: Virl Axe  Height (inches): 74  Interpreting Physician: Fransico Him MD, ABSM  Weight (lbs): 285  RPSGT: Rosebud Poles  BMI: 40  MRN: [id number]  Neck Size: 17.00   CLINICAL INFORMATION  Sleep Study Type: Split Night CPAP Indication for sleep study: N/A  Epworth Sleepiness Score: 4  SLEEP STUDY TECHNIQUE  As per the AASM Manual for the Scoring of Sleep and Associated Events v2.3 (April 2016) with a hypopnea requiring 4% desaturations. The channels recorded and monitored were frontal, central and occipital EEG, electrooculogram (EOG), submentalis EMG (chin), nasal and oral airflow, thoracic and abdominal wall motion, anterior tibialis EMG, snore microphone, electrocardiogram, and pulse oximetry. Continuous positive airway pressure (CPAP) was initiated when the patient met split night criteria and was titrated according to treat sleep-disordered breathing.  MEDICATIONS  Medications self-administered by patient taken the night of the study : N/A  RESPIRATORY PARAMETERS  Diagnostic Total AHI (/hr): 34.1 RDI (/hr): 50.9 OA Index (/hr): 3.5 CA Index (/hr): 0.0  REM AHI (/hr): 70.9 NREM AHI (/hr): 30.9 Supine AHI (/hr): 30.0 Non-supine AHI (/hr): 34.16  Min O2 Sat (%): 76.0 Mean O2 (%): 93.3 Time below 88% (min): 6.3      Titration Optimal Pressure (cm): N/A  AHI at Optimal Pressure (/hr): N/A Min O2 at Optimal Pressure (%): 84.0  Supine % at Optimal (%): N/A Sleep % at Optimal (%): N/A       SLEEP ARCHITECTURE  The recording time for the entire night was 409.9 minutes. During a baseline period of 187.3 minutes, the patient slept for 139.0 minutes in REM and nonREM, yielding a sleep efficiency of 74.2%%. Sleep onset after lights out was 18.7 minutes with a REM latency of 95.0 minutes. The patient spent 9.4%% of the night in stage N1 sleep, 76.3%%  in stage N2 sleep, 6.5%% in stage N3 and 7.9%% in REM. During the titration period of 211.4 minutes, the patient slept for 115.0 minutes in REM and nonREM, yielding a sleep efficiency of 54.4%%. Sleep onset after CPAP initiation was 10.3 minutes with a REM latency of 96.5 minutes. The patient spent 8.7%% of the night in stage N1 sleep, 60.4%% in stage N2 sleep, 0.0%% in stage N3 and 30.9%% in REM.  CARDIAC DATA  The 2 lead EKG demonstrated pacemaker generated. The mean heart rate was 100.0 beats per minute. Other EKG findings include: PVCs.   LEG MOVEMENT DATA  The total Periodic Limb Movements of Sleep (PLMS) were 0. The PLMS index was 0.0 .  IMPRESSIONS  - Severe obstructive sleep apnea occurred during the diagnostic portion of the study (AHI = 34.1/hour). An optimal PAP pressure could not be selected for this patient based on the available study data.  - No significant central sleep apnea occurred during the diagnostic portion of the study (CAI = 0.0/hour).  - Moderate oxygen desaturation was noted during the diagnostic portion of the study (Min O2 =76.0%).  - The patient snored with moderate snoring volume during the diagnostic portion of the study.  - EKG findings include PVCs.  - Clinically significant periodic limb movements did not occur during sleep.  DIAGNOSIS  - Obstructive Sleep Apnea (327.23 [G47.33 ICD-10])  RECOMMENDATIONS  - Due to inadequate time for adequate PAP titration, recommend full night in lab CPAP titration. May need BiPAP. - Avoid alcohol, sedatives and other CNS depressants that  may worsen sleep apnea and disrupt normal sleep architecture.  - Sleep hygiene should be reviewed to assess factors that may improve sleep quality.  - Weight management and regular exercise should be initiated or continued.  [Electronically signed] 10/08/2017 01:53 PM Fransico Him MD, ABSM  Diplomate, American Board of Sleep Medicine

## 2017-10-12 ENCOUNTER — Telehealth: Payer: Self-pay | Admitting: *Deleted

## 2017-10-12 DIAGNOSIS — G4733 Obstructive sleep apnea (adult) (pediatric): Secondary | ICD-10-CM

## 2017-10-12 NOTE — Telephone Encounter (Signed)
Informed patient of sleep study results and patient understanding was verbalized. Patient understands his sleep study showed that they have sleep apnea but had an inadequate time for adequate CPAP titration and Dr Radford Pax Recommends CPAP titration.  Patient wants to wait until he is back from vacation before deciding on the titration.

## 2017-10-12 NOTE — Telephone Encounter (Signed)
-----   Message from Sueanne Margarita, MD sent at 10/08/2017  1:55 PM EDT ----- Please let patient know that they have sleep apnea but had an adequate time for adequate CPAP titration.  Recommend CPAP titration. Please set up titration in the sleep lab.

## 2017-10-25 ENCOUNTER — Encounter: Payer: Self-pay | Admitting: Cardiology

## 2017-10-25 ENCOUNTER — Ambulatory Visit (INDEPENDENT_AMBULATORY_CARE_PROVIDER_SITE_OTHER): Payer: Medicare Other | Admitting: *Deleted

## 2017-10-25 ENCOUNTER — Ambulatory Visit (INDEPENDENT_AMBULATORY_CARE_PROVIDER_SITE_OTHER): Payer: Medicare Other

## 2017-10-25 DIAGNOSIS — Z95810 Presence of automatic (implantable) cardiac defibrillator: Secondary | ICD-10-CM | POA: Diagnosis not present

## 2017-10-25 DIAGNOSIS — I5022 Chronic systolic (congestive) heart failure: Secondary | ICD-10-CM

## 2017-10-25 DIAGNOSIS — I428 Other cardiomyopathies: Secondary | ICD-10-CM

## 2017-10-25 NOTE — Progress Notes (Signed)
Remote ICD transmission.   

## 2017-10-25 NOTE — Progress Notes (Signed)
EPIC Encounter for ICM Monitoring  Patient Name: BLASE BECKNER is a 77 y.o. male Date: 10/25/2017 Primary Care Physican: Lucianne Lei, MD Primary Cardiologist:[name] Electrophysiologist: [name] Dry Weight:314lbs Bi-V Pacing: >99%      Heart Failure questions reviewed, pt asymptomatic.  Discussed healthy eating.  He was at a reunion during decreased impedance.   Thoracic impedance normal.  Prescribed dosage: Furosemide 40 mg 1 tablet daily.  Labs: 09/19/2017 Creatinine 1.20, BUN 18, Potassium 4.2, Sodium 147, EGFR 58-67 07/18/2016 Creatinine 1.09, BUN 17, Potassium 4.2, Sodium 145, EGR 76  Recommendations: No changes.   Encouraged to call for fluid symptoms.  Follow-up plan: ICM clinic phone appointment on 11/27/2017.    Copy of ICM check sent to Dr. [name].   3 month ICM trend: 10/25/2017    1 Year ICM trend:       Rosalene Billings, RN 10/25/2017 3:37 PM

## 2017-11-09 ENCOUNTER — Other Ambulatory Visit: Payer: Self-pay | Admitting: Internal Medicine

## 2017-11-14 ENCOUNTER — Ambulatory Visit: Payer: Medicare Other | Admitting: Podiatry

## 2017-11-14 ENCOUNTER — Ambulatory Visit (INDEPENDENT_AMBULATORY_CARE_PROVIDER_SITE_OTHER): Payer: Medicare Other

## 2017-11-14 ENCOUNTER — Encounter: Payer: Self-pay | Admitting: Podiatry

## 2017-11-14 ENCOUNTER — Other Ambulatory Visit: Payer: Self-pay | Admitting: Podiatry

## 2017-11-14 VITALS — BP 132/73 | HR 65

## 2017-11-14 DIAGNOSIS — M779 Enthesopathy, unspecified: Secondary | ICD-10-CM

## 2017-11-14 DIAGNOSIS — M79672 Pain in left foot: Secondary | ICD-10-CM

## 2017-11-14 DIAGNOSIS — M7752 Other enthesopathy of left foot: Secondary | ICD-10-CM

## 2017-11-14 DIAGNOSIS — L600 Ingrowing nail: Secondary | ICD-10-CM

## 2017-11-14 NOTE — Patient Instructions (Signed)

## 2017-11-14 NOTE — Progress Notes (Signed)
Subjective:   Patient ID: Rickey Rice, male   DOB: 77 y.o.   MRN: [id number]   HPI Patient states that he developed pain in the side of his big toenail left and also he is getting some pain in his left ankle on the outside that he is not sure about.  He is obese which is complicating factor is had numerous surgeries and patient does have good digital perfusion and is well oriented x3 and does not currently smoke and likes to be active   Review of Systems  All other systems reviewed and are negative.       Objective:  Physical Exam  Constitutional: He appears well-developed and well-nourished.  Cardiovascular: Intact distal pulses.  Pulmonary/Chest: Effort normal.  Musculoskeletal: Normal range of motion.  Neurological: He is alert.  Skin: Skin is warm.  Nursing note and vitals reviewed.   Neurovascular status found to be intact with muscle strength adequate range of motion within normal limits with patient noted to have incurvated medial border left hallux is painful with no active drainage or redness but it is abnormally shaped along with inflammation pain of the outside of the left ankle     Assessment:  Ingrown toenail deformity left hallux medial border along with mild ankle instability     Plan:  H&P conditions reviewed discussed.  We will get a focus on the nail today and I explained condition and procedure and I recommended removal of the corner explained procedure and risk.  Patient wants surgery signed consent form today I infiltrated 60 mg like Marcaine mixture remove the medial border exposed matrix and applied phenol 3 applications 30 seconds followed by alcohol lavage and sterile dressing.  Given instructions on soaks patient will do stretching exercises for the ankle and should be uneventful

## 2017-11-27 ENCOUNTER — Ambulatory Visit (INDEPENDENT_AMBULATORY_CARE_PROVIDER_SITE_OTHER): Payer: Medicare Other

## 2017-11-27 ENCOUNTER — Telehealth: Payer: Self-pay | Admitting: Cardiology

## 2017-11-27 DIAGNOSIS — Z95810 Presence of automatic (implantable) cardiac defibrillator: Secondary | ICD-10-CM

## 2017-11-27 DIAGNOSIS — I5022 Chronic systolic (congestive) heart failure: Secondary | ICD-10-CM

## 2017-11-27 NOTE — Telephone Encounter (Signed)
LMOVM reminding pt to send remote transmission.   

## 2017-11-27 NOTE — Progress Notes (Signed)
EPIC Encounter for ICM Monitoring  Patient Name: Rickey Rice is a 77 y.o. male Date: 11/27/2017 Primary Care Physican: Lucianne Lei, MD Primary Cardiologist:[name] Electrophysiologist: [name] Dry Weight:312lbs Bi-V Pacing: >99%      Heart Failure questions reviewed, pt asymptomatic.   Thoracic impedance normal.  Prescribed dosage: Furosemide 40 mg 1 tablet daily.  Labs: 09/19/2017 Creatinine 1.20, BUN 18, Potassium 4.2, Sodium 147, EGFR 58-67 07/18/2016 Creatinine 1.09, BUN 17, Potassium 4.2, Sodium 145, EGR 76  Recommendations: No changes.   Encouraged to call for fluid symptoms.  Follow-up plan: ICM clinic phone appointment on 12/27/2017.    Copy of ICM check sent to Dr. [name].   3 month ICM trend: 11/27/2017    1 Year ICM trend:       Rosalene Billings, RN 11/27/2017 9:56 AM

## 2017-12-06 LAB — CUP PACEART REMOTE DEVICE CHECK
Battery Remaining Longevity: 56 mo
Battery Remaining Percentage: 59 %
Brady Statistic AP VP Percent: 1 %
Brady Statistic AS VP Percent: 99 %
Brady Statistic AS VS Percent: 1 %
HIGH POWER IMPEDANCE MEASURED VALUE: 63 Ohm
HighPow Impedance: 63 Ohm
Implantable Lead Implant Date: 20160324
Implantable Lead Implant Date: 20160324
Implantable Lead Location: 753858
Implantable Lead Model: 7122
Lead Channel Impedance Value: 480 Ohm
Lead Channel Pacing Threshold Amplitude: 1 V
Lead Channel Pacing Threshold Pulse Width: 0.5 ms
Lead Channel Pacing Threshold Pulse Width: 0.5 ms
Lead Channel Sensing Intrinsic Amplitude: 2.6 mV
Lead Channel Setting Pacing Amplitude: 1 V
Lead Channel Setting Pacing Amplitude: 2 V
Lead Channel Setting Pacing Amplitude: 2.5 V
Lead Channel Setting Pacing Pulse Width: 0.5 ms
MDC IDC LEAD IMPLANT DT: 20160324
MDC IDC LEAD LOCATION: 753859
MDC IDC LEAD LOCATION: 753860
MDC IDC MSMT BATTERY VOLTAGE: 2.98 V
MDC IDC MSMT LEADCHNL LV IMPEDANCE VALUE: 430 Ohm
MDC IDC MSMT LEADCHNL LV PACING THRESHOLD AMPLITUDE: 0.75 V
MDC IDC MSMT LEADCHNL LV PACING THRESHOLD PULSEWIDTH: 0.5 ms
MDC IDC MSMT LEADCHNL RA IMPEDANCE VALUE: 390 Ohm
MDC IDC MSMT LEADCHNL RV PACING THRESHOLD AMPLITUDE: 0.75 V
MDC IDC MSMT LEADCHNL RV SENSING INTR AMPL: 12 mV
MDC IDC PG IMPLANT DT: 20160324
MDC IDC SESS DTM: 20190801060009
MDC IDC SET LEADCHNL RV PACING PULSEWIDTH: 0.5 ms
MDC IDC SET LEADCHNL RV SENSING SENSITIVITY: 0.5 mV
MDC IDC STAT BRADY AP VS PERCENT: 1 %
MDC IDC STAT BRADY RA PERCENT PACED: 1 %
Pulse Gen Serial Number: 7240350

## 2017-12-09 ENCOUNTER — Other Ambulatory Visit: Payer: Self-pay | Admitting: Internal Medicine

## 2017-12-12 NOTE — Telephone Encounter (Signed)
Ok to refill.  Dr. H 

## 2017-12-27 ENCOUNTER — Telehealth: Payer: Self-pay

## 2017-12-27 ENCOUNTER — Telehealth: Payer: Self-pay | Admitting: Cardiology

## 2017-12-27 ENCOUNTER — Ambulatory Visit (INDEPENDENT_AMBULATORY_CARE_PROVIDER_SITE_OTHER): Payer: Medicare Other

## 2017-12-27 DIAGNOSIS — I5022 Chronic systolic (congestive) heart failure: Secondary | ICD-10-CM

## 2017-12-27 DIAGNOSIS — Z95810 Presence of automatic (implantable) cardiac defibrillator: Secondary | ICD-10-CM | POA: Diagnosis not present

## 2017-12-27 NOTE — Telephone Encounter (Signed)
LMOVM reminding pt to send remote transmission.   

## 2017-12-27 NOTE — Telephone Encounter (Signed)
Attempted return call to patient due to left voice mail asking if ICM remote transmission was received.  Left voice mail message stating it was not received and provided [employer] number.

## 2017-12-27 NOTE — Progress Notes (Signed)
EPIC Encounter for ICM Monitoring  Patient Name: Rickey Rice is a 77 y.o. male Date: 12/27/2017 Primary Care Physican: Lucianne Lei, MD Primary Cardiologist:[name] Electrophysiologist: [name] Dry Weight:311lbs Bi-V Pacing: >99%      Heart Failure questions reviewed, pt asymptomatic.   Thoracic impedance normal.  Prescribed: Furosemide 40 mg 1 tablet daily.  Labs: 09/19/2017 Creatinine 1.20, BUN 18, Potassium 4.2, Sodium 147, EGFR 58-67 07/18/2016 Creatinine 1.09, BUN 17, Potassium 4.2, Sodium 145, EGR 76  Recommendations: No changes.   Encouraged to call for fluid symptoms.  Follow-up plan: ICM clinic phone appointment on 01/31/2018.   Office appointment scheduled 12/28/2017 with Dr. Debara Pickett.    Copy of ICM check sent to Dr. [name].   3 month ICM trend: 12/27/2017    1 Year ICM trend:       Rosalene Billings, RN 12/27/2017 2:15 PM

## 2017-12-28 ENCOUNTER — Ambulatory Visit: Payer: Medicare Other | Admitting: Internal Medicine

## 2017-12-28 ENCOUNTER — Encounter: Payer: Self-pay | Admitting: Internal Medicine

## 2017-12-28 VITALS — BP 113/68 | HR 63 | Ht 71.5 in | Wt 313.6 lb

## 2017-12-28 DIAGNOSIS — I472 Ventricular tachycardia: Secondary | ICD-10-CM

## 2017-12-28 DIAGNOSIS — I428 Other cardiomyopathies: Secondary | ICD-10-CM

## 2017-12-28 DIAGNOSIS — Z95810 Presence of automatic (implantable) cardiac defibrillator: Secondary | ICD-10-CM | POA: Diagnosis not present

## 2017-12-28 DIAGNOSIS — I5022 Chronic systolic (congestive) heart failure: Secondary | ICD-10-CM

## 2017-12-28 DIAGNOSIS — I4729 Other ventricular tachycardia: Secondary | ICD-10-CM

## 2017-12-28 MED ORDER — SACUBITRIL-VALSARTAN 49-51 MG PO TABS: 1.0000 | ORAL_TABLET | Freq: Two times a day (BID) | ORAL | 11 refills | Status: DC

## 2017-12-28 NOTE — Patient Instructions (Addendum)
Medication Instructions:  INCREASE entresto to 49-51mg  twice daily -- please start within 1 month   If you need a refill on your cardiac medications before your next appointment, please call your pharmacy.   Lab work: NONE needed  Testing/Procedures: Your physician has requested that you have an echocardiogram. Echocardiography is a painless test that uses sound waves to create images of your heart. It provides your doctor with information about the size and shape of your heart and how well your heart's chambers and valves are working. This procedure takes approximately one hour. There are no restrictions for this procedure. -- due July/August 2020  Follow-Up: At [facility], you and your health needs are our priority.  As part of our continuing mission to provide you with exceptional heart care, we have created designated Provider Care Teams.  These Care Teams include your primary Cardiologist (physician) and Advanced Practice Providers (APPs -  Physician Assistants and Nurse Practitioners) who all work together to provide you with the care you need, when you need it. You will need a follow up appointment in 8 months.  Please call our office 2 months in advance to schedule this appointment.  You may see Dr. Debara Pickett or one of the following Advanced Practice Providers on your designated Care Team: Almyra Deforest, [place] . Fabian Sharp, PA-C

## 2017-12-28 NOTE — Progress Notes (Signed)
OFFICE NOTE  Chief Complaint:  Follow-up heart failure  Primary Care Physician: Lucianne Lei, MD  HPI:  Rickey Rice  is a 77 year old gentleman with a history of diabetes, hypertension, hypothyroidism, and morbid obesity. In 2010 he had complaints of chest pain and underwent stress testing with [facility] which was a 2-day nuclear stress test and was apparently negative. Recently he underwent a colonoscopy and a preoperative EKG was abnormal. I unfortunately do not have that EKG to review, but I did review the EKG from your office on April 09, 2012 which showed a borderline intraventricular conduction delay, a sinus rhythm at 66, and poor R-wave progression. Today in the office he has an abnormal EKG demonstrating a left bundle branch block with a QRS duration of 168 msec, heart rate of 78 in sinus. His only symptoms are shortness of breath with exertion. He underwent evaluation of his left bundle branch block in February 2014. This is a 2 day nuclear stress test which was negative for ischemia. There was a small inferior and apical defect which could represent scar versus artifact. EF was mildly reduced at 49%.    Rickey Rice returns today for followup. He underwent knee replacement surgery as we had authorized him to do. He is tolerated surgery without any complications. He reports that he is recovering fairly well and is more mobile than he had been in the past. He started to do some exercises at the [place] and also this will translate into weight loss. Blood pressure has been stable he denies any chest pain.  He had blood work in January from his primary care provider which showed a total cholesterol of 128, HDL 51, triglycerides 53 and LDL 65. His hemoglobin A1c was 6.2.  Rickey Rice is now contemplating lethargy. Unfortunately has not been able to walk enough to lose weight in fact has gained some weight. 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 He feels like it's time to retire work on his generalized health. I would tend to agree with this.  I saw Rickey Rice back in the office today. He recently followed up from the hospital after having a sudden cardiac arrest. Unfortunately he was revived and is now status post AICD. He was to have CRT-D therapy, however there was difficulty with the LV lead, therefore he only has a single ventricle pacing with defibrillator functions. He is tolerating this well and had a small episode with fever postoperatively, but this was thought to be due to possibly a UTI. There is no evidence of pacemaker site fluctuance or swelling. Overall he feels he is doing well. He started exercising is managed to lose some weight. He is less short of breath and is more active.  Rickey Rice returns today for follow-up. Overall he is doing exceedingly well. He's managed to lose about 10 pounds. He is now below 300 pounds. His shortness of breath is improved some and he is ambulating better on his knees. He's had no device problems including no firings. He denies any chest pain or worsening shortness of breath and has not had any presyncopal or syncopal episodes.  Rickey Rice returns today for follow-up. Unfortunately he's gained some of his weight back. He denies a chest pain or worsening shortness of breath. He does not have any swelling. His last EF was as mentioned 40% in March 2016. EKG shows left bundle branch block. He scheduled to see Dr. [name] back for device  interrogation next month.  11/12/2015  Rickey Rice was seen today in follow-up. Unfortunately he was recently seen for some weight gain and fatigue by Rosaria Ferries, PA-C. She felt that he might be getting some extra fluid and recommended a short trial of increased Lasix. Weight is actually been fairly stable now at 300-301. Around that time he was found to have a UTI was treated for that and his symptoms improved. He was also noted to have  problems with infection and underwent multiple rounds of antibiotics. This led to problems with diarrhea or digestive problems and ultimately he went on probiotics. Recently, though he was talking on the phone and had an episode of transient global amnesia shortly after his wife left the house. He called her back and she came home and noted that he may have had an episode with his defibrillator. This was subsequently confirmed to be VT and he underwent a shock for that. He is seen Dr. [name] who increased his carvedilol to 12-1/2 mg twice a day. Blood pressure is actually a little low today but he reports being asymptomatic with that.  02/15/2016  Rickey Rice returns today for follow-up. He reports feeling well and denies worsening shortness of breath or chest pain. HE has not had any presyncopal spells. He just saw EP who noted his defibrillator was working properly. We again discussed strategies for heart failure management, including possibly switching lisinopril over to [facility]. He did not seem interested in this. It is noted his bp is higher today and that may at least allow uptitration of his lisinopril.  05/29/2016  Rickey Rice was seen today in follow-up. Weight is down slightly although has not made a significant improvement. He needs to start to do more exercise but he has blamed the weather and his arthritis. Blood pressure appears to be well-controlled and he brought readings in the office today. Although I do feel that there still potentially some room to consider adding Entresto. We discussed the medication today and it would effectively replace his lisinopril. He understands he would need to wash off the lisinopril for 36-48 hours before starting Entresto. We can provide him with samples and help him secure preauthorization.  12/28/2017  Rickey Rice is seen today in follow-up heart failure.  He reports NYHA class II symptoms, noting some shortness of breath with moderate exertion.  He has  recently had some weight loss.  He seems to be tolerating Entresto 24/26.  According to Dr. Olin Pia notes his LV lead is now activated.  He does appear to be biventricular pacing today.  Labs from July showed total cholesterol 143, HDL 52, LDL 79 triglycerides 42, hemoglobin A1c 5.8.  BNP in November was 155.  Recent remote check showed no change in thoracic impedance suggesting that he is compensated with his heart failure.  We previously discussed increasing the dose of Entresto as blood pressure would tolerate.  He is interested in getting a flu shot today.  PMHx:  Past Medical History:  Diagnosis Date  . AICD (automatic cardioverter/defibrillator) present   . Arthritis    oa  . Cardiac arrest ([place]) 06/07/2014   Primary VF arrest with successful resuscitation and s/p ICD implant  . Diabetes mellitus without complication ([place])    type 2  . Dyslipidemia   . History of nuclear stress test 04/2012   lexiscan - 2 day protocol; low risk study, evidence of inferrior & apical scar but no ischemia   . Hypertension   . Hypothyroidism   .  LBBB (left bundle branch block)   . Morbid obesity ([place])   . NICM (nonischemic cardiomyopathy) ([place])   . Prostate enlargement    pt denies    Past Surgical History:  Procedure Laterality Date  . BI-VENTRICULAR IMPLANTABLE CARDIOVERTER DEFIBRILLATOR N/A 06/18/2014   STJ CRTD implanted by Dr [name]  . COLONOSCOPY W/ BIOPSIES    . COLONOSCOPY WITH PROPOFOL N/A 04/05/2017   Procedure: COLONOSCOPY WITH PROPOFOL;  Surgeon: Juanita Craver, MD;  Location: [facility];  Service: Endoscopy;  Laterality: N/A;  . [place]   1 st joint  right hand amputated  . KNEE ARTHROSCOPY Left   . LEFT HEART CATHETERIZATION WITH CORONARY ANGIOGRAM N/A 06/12/2014   Procedure: LEFT HEART CATHETERIZATION WITH CORONARY ANGIOGRAM;  Surgeon: Leonie Man, MD;  Location: [facility] CATH LAB;  Service: Cardiovascular;  Laterality: N/A;  . [place]  . TOTAL KNEE ARTHROPLASTY  Right 02/05/2013   Procedure: TOTAL KNEE ARTHROPLASTY;  Surgeon: Kerin Salen, MD;  Location: [place];  Service: Orthopedics;  Laterality: Right;  . TOTAL KNEE ARTHROPLASTY Left 01/19/2014   Procedure: LEFT TOTAL KNEE ARTHROPLASTY;  Surgeon: Kerin Salen, MD;  Location: [place];  Service: Orthopedics;  Laterality: Left;    FAMHx:  Family History  Problem Relation Age of Onset  . Cancer Mother   . Kidney disease Brother   . Hypertension Sister     SOCHx:   reports that he has never smoked. He has never used smokeless tobacco. He reports that he drinks alcohol. He reports that he does not use drugs.  ALLERGIES:  Allergies  Allergen Reactions  . Bee Venom Swelling    Swelling on lips and tongue  . Demerol [Meperidine]     Not sure  . Pioglitazone     Lips swelling= actos  . Shrimp [Shellfish Allergy]     rash  . Sulfa Antibiotics     Not sure    ROS: Pertinent items noted in HPI and remainder of comprehensive ROS otherwise negative.  HOME MEDS: Current Outpatient Medications  Medication Sig Dispense Refill  . acetaminophen (TYLENOL) 500 MG tablet Take 500 mg by mouth daily as needed for moderate pain or headache.    Marland Kitchen amoxicillin (AMOXIL) 500 MG capsule Take 2,000 mg by mouth as directed. Prior to dental/periodontal appointments    . aspirin EC 81 MG tablet Take 81 mg by mouth daily.    . carboxymethylcellulose (REFRESH PLUS) 0.5 % SOLN Place 1 drop into both eyes daily as needed (dry eyes).    . carvedilol (COREG) 12.5 MG tablet TAKE 1 TABLET BY MOUTH TWICE DAILY WITH A MEAL. 180 tablet 2  . ENTRESTO 24-26 MG TAKE 1 TABLET BY MOUTH TWICE DAILY. 60 tablet 0  . ENTRESTO 24-26 MG TAKE 1 TABLET BY MOUTH TWICE DAILY. 180 tablet 0  . furosemide (LASIX) 40 MG tablet TAKE 1 TABLET(40 MG) BY MOUTH DAILY 90 tablet 0  . glimepiride (AMARYL) 4 MG tablet Take 8 mg by mouth daily before breakfast.     . levothyroxine (SYNTHROID, LEVOTHROID) 75 MCG tablet Take 75 mcg by mouth every  evening. Monday through Saturday    . Multiple Vitamins-Minerals (ICAPS AREDS 2) CAPS Take 1 capsule by mouth 2 (two) times daily.    . Omega-3 Fatty Acids (OMEGA 3 PO) Take 1,600 mg by mouth every other day.    . rosuvastatin (CRESTOR) 10 MG tablet Take 1 tablet (10 mg total) by mouth daily at 6 PM.  30 tablet 0  . saxagliptin HCl (ONGLYZA) 5 MG TABS tablet Take 5 mg by mouth daily.    . TURMERIC CURCUMIN PO Take 1 tablet by mouth every other day.     No current facility-administered medications for this visit.     LABS/IMAGING: No results found for this or any previous visit (from the past 48 hour(s)). No results found.  VITALS: BP 113/68   Pulse 63   Ht 5' 11.5" (1.816 m)   Wt (!) 313 lb 9.6 oz (142.2 kg)   BMI 43.13 kg/m   EXAM: General appearance: alert, no distress and morbidly obese Neck: no carotid bruit, no JVD and thyroid not enlarged, symmetric, no tenderness/mass/nodules Lungs: clear to auscultation bilaterally and AICD in left upper chest Heart: regular rate and rhythm, S1, S2 normal, no murmur, click, rub or gallop Abdomen: soft, non-tender; bowel sounds normal; no masses,  no organomegaly and Morbidly obese Extremities: extremities normal, atraumatic, no cyanosis or edema Pulses: 2+ and symmetric Skin: Skin color, texture, turgor normal. No rashes or lesions  EKG: Biventricular paced rhythm at 63-personally reviewed  ASSESSMENT: 1. Status post aborted sudden cardiac death-status post placement of a [employer] Bi-V AICD (LV lead is disabled due to dysfunction) 2. Recent VT/VF with appropriate AICD shock 3. Negative nuclear stress test for ischemia, fixed inferoapical defect suggesting possible scar versus artifact, EF 49% 4. Diabetes type 2 on oral medications - A1c 5.8 5. Hypertension-well controlled 6. Dyslipidemia on statin - well controlled 7. Ischemic cardiomyopathy-EF 30-35%  PLAN: 1.   Rickey Rice appears euvolemic and his weight is down 7 pounds since  we saw him in June.  His LV lead is now functioning and he is biventricular pacing.  I would like to further uptitrate his Entresto increased to 49/51 today.  He will need a repeat echo next year to see if there is been any improvement in LV function.  We offered a flu shot today however we do not have the high intensity quadrivalent vaccine and he declined.  Follow-up next summer.  Pixie Casino, MD, [facility], [place] Director of the Advanced Lipid Disorders &  Cardiovascular Risk Reduction Clinic Diplomate of the American Board of Clinical Lipidology Attending Cardiologist  Direct Dial: [phone]  Fax: [phone]  Website:  [email or web address] [email or web address] 12/28/2017, 8:41 AM

## 2018-01-07 ENCOUNTER — Ambulatory Visit: Payer: Medicare Other | Admitting: Podiatry

## 2018-01-07 ENCOUNTER — Encounter: Payer: Self-pay | Admitting: Podiatry

## 2018-01-07 DIAGNOSIS — M722 Plantar fascial fibromatosis: Secondary | ICD-10-CM | POA: Diagnosis not present

## 2018-01-07 MED ORDER — TRIAMCINOLONE ACETONIDE 10 MG/ML IJ SUSP
10.0000 mg | Freq: Once | INTRAMUSCULAR | Status: AC
  Administered 2018-01-07: 10 mg

## 2018-01-09 NOTE — Progress Notes (Signed)
Subjective:   Patient ID: Rickey Rice, male   DOB: 77 y.o.   MRN: [id number]   HPI Patient presents stating that the left heel has been sore and making it hard to walk and started to develop pain in the forefoot and is been going on for several months.  Does not remember specific injury pattern   ROS      Objective:  Physical Exam  Neurovascular status intact muscle strength is adequate patient found to have inflammation pain of the left plantar fascia at the insertional point of the tendon into the calcaneus with fluid buildup noted around the medial band.  Patient is found to have good digital perfusion and moderate discomfort in the forefoot around the lesser MPJ     Assessment:  Plantar fasciitis left with inflammation fluid buildup along with moderate capsulitis of the lesser MPJ     Plan:  H&P condition reviewed and today I went ahead and injected the plantar fascial left 3 mg Kenalog 5 mg Xylocaine advised on anti-inflammatories physical therapy and reappoint to recheck again in several weeks and may require treatment for the forefoot depending on response  X-rays were negative for signs of fracture and did indicate plantar spur formation with no indications of advanced arthritis

## 2018-01-31 ENCOUNTER — Ambulatory Visit (INDEPENDENT_AMBULATORY_CARE_PROVIDER_SITE_OTHER): Payer: Medicare Other

## 2018-01-31 ENCOUNTER — Ambulatory Visit (INDEPENDENT_AMBULATORY_CARE_PROVIDER_SITE_OTHER): Payer: Medicare Other | Admitting: *Deleted

## 2018-01-31 ENCOUNTER — Telehealth: Payer: Self-pay

## 2018-01-31 DIAGNOSIS — Z95810 Presence of automatic (implantable) cardiac defibrillator: Secondary | ICD-10-CM

## 2018-01-31 DIAGNOSIS — I5022 Chronic systolic (congestive) heart failure: Secondary | ICD-10-CM

## 2018-01-31 DIAGNOSIS — I428 Other cardiomyopathies: Secondary | ICD-10-CM | POA: Diagnosis not present

## 2018-01-31 NOTE — Progress Notes (Signed)
EPIC Encounter for ICM Monitoring  Patient Name: QUINTO TIPPY is a 77 y.o. male Date: 01/31/2018 Primary Care Physican: Lucianne Lei, MD Primary Cardiologist:[name] Electrophysiologist: Faustino Congress Weight:Previous weight 311lbs Bi-V Pacing: >99%       Attempted call to patient and unable to reach.  Left detailed message, per DPR, regarding transmission.  Transmission reviewed.    Thoracic impedance normal.   Prescribed: Furosemide 40 mg 1 tablet daily.  Labs: 09/19/2017 Creatinine 1.20, BUN 18, Potassium 4.2, Sodium 147, EGFR 58-67 07/18/2016 Creatinine 1.09, BUN 17, Potassium 4.2, Sodium 145, EGR 76  Recommendations: Left voice mail with ICM number and encouraged to call if experiencing any fluid symptoms.  Follow-up plan: ICM clinic phone appointment on 03/04/2018.    Copy of ICM check sent to Dr. [name].   3 month ICM trend: 01/31/2018    1 Year ICM trend:       Rosalene Billings, RN 01/31/2018 9:31 AM

## 2018-01-31 NOTE — Progress Notes (Signed)
Remote ICD transmission.   

## 2018-01-31 NOTE — Telephone Encounter (Signed)
Remote ICM transmission received.  Attempted call to patient regarding ICM remote transmission and left detailed message, per DPR, with next ICM remote transmission date of 03/04/2018.  Advised to return call for any fluid symptoms or questions.    

## 2018-02-01 DIAGNOSIS — K115 Sialolithiasis: Secondary | ICD-10-CM | POA: Insufficient documentation

## 2018-02-01 NOTE — Progress Notes (Addendum)
Spoke with patient.  He reported he is doing well.  Transmission reviewed.  No changes today.  Next transmission 03-04-2018

## 2018-02-02 ENCOUNTER — Other Ambulatory Visit: Payer: Self-pay | Admitting: Internal Medicine

## 2018-02-03 ENCOUNTER — Encounter: Payer: Self-pay | Admitting: Cardiology

## 2018-02-04 NOTE — Telephone Encounter (Signed)
Rx(s) sent to pharmacy electronically.  

## 2018-02-15 ENCOUNTER — Encounter: Payer: Self-pay | Admitting: Podiatry

## 2018-02-15 ENCOUNTER — Ambulatory Visit: Payer: Medicare Other | Admitting: Podiatry

## 2018-02-15 DIAGNOSIS — M722 Plantar fascial fibromatosis: Secondary | ICD-10-CM

## 2018-02-15 MED ORDER — TRIAMCINOLONE ACETONIDE 10 MG/ML IJ SUSP
10.0000 mg | Freq: Once | INTRAMUSCULAR | Status: AC
  Administered 2018-02-15: 10 mg

## 2018-02-17 NOTE — Progress Notes (Signed)
Subjective:   Patient ID: Rickey Rice, male   DOB: 77 y.o.   MRN: [id number]   HPI Patient presents stating he has developed pain in the bottom of his left heel and is more on the outside and the inside and its been very painful   ROS      Objective:  Physical Exam  Neurovascular status intact with exquisite discomfort plantar lateral aspect left heel with inflammation fluid at the insertion     Assessment:  Acute plantar fasciitis left lateral band     Plan:  H&P condition reviewed and at this point I went ahead and injected the left plantar fascia 3 mg Kenalog 5 mg Xylocaine from a lateral approach and advised on reduced activity and reappoint if symptoms recur or persist

## 2018-03-04 ENCOUNTER — Ambulatory Visit (INDEPENDENT_AMBULATORY_CARE_PROVIDER_SITE_OTHER): Payer: Medicare Other

## 2018-03-04 DIAGNOSIS — Z95810 Presence of automatic (implantable) cardiac defibrillator: Secondary | ICD-10-CM

## 2018-03-04 DIAGNOSIS — I5022 Chronic systolic (congestive) heart failure: Secondary | ICD-10-CM | POA: Diagnosis not present

## 2018-03-05 NOTE — Progress Notes (Signed)
EPIC Encounter for ICM Monitoring  Patient Name: DIMETRI ARMITAGE is a 77 y.o. male Date: 03/05/2018 Primary Care Physican: Lucianne Lei, MD Primary Cardiologist:[name] Electrophysiologist: [name] Last weight 756DIL Bi-V Pacing: >99%                                                  Spoke with patient.  He denied any fluid symptoms. He has spoken with PCP regarding getting appointment with counselor due to some depression symptoms.   Thoracic impedance normal.   Prescribed: Furosemide 40 mg 1 tablet daily.  Labs: 09/19/2017 Creatinine 1.20, BUN 18, Potassium 4.2, Sodium 147, EGFR 58-67 07/18/2016 Creatinine 1.09, BUN 17, Potassium 4.2, Sodium 145, EGR 76  Recommendations: No changes.   Encouraged to call for fluid symptoms.  Follow-up plan: ICM clinic phone appointment on 04/11/2018.     Copy of ICM check sent to Dr. [name].   3 month ICM trend: 03/05/2018    1 Year ICM trend:       Rosalene Billings, RN 03/05/2018 11:29 AM

## 2018-04-03 LAB — CUP PACEART REMOTE DEVICE CHECK
Battery Remaining Percentage: 56 %
Brady Statistic AP VP Percent: 1 %
Brady Statistic AP VS Percent: 1 %
Brady Statistic AS VP Percent: 99 %
Brady Statistic AS VS Percent: 1 %
Date Time Interrogation Session: 20191107070020
HighPow Impedance: 64 Ohm
HighPow Impedance: 64 Ohm
Implantable Lead Implant Date: 20160324
Implantable Lead Implant Date: 20160324
Implantable Lead Implant Date: 20160324
Lead Channel Impedance Value: 410 Ohm
Lead Channel Pacing Threshold Amplitude: 0.75 V
Lead Channel Pacing Threshold Pulse Width: 0.5 ms
Lead Channel Pacing Threshold Pulse Width: 0.5 ms
Lead Channel Sensing Intrinsic Amplitude: 3.1 mV
Lead Channel Setting Pacing Amplitude: 2.5 V
MDC IDC LEAD LOCATION: 753858
MDC IDC LEAD LOCATION: 753859
MDC IDC LEAD LOCATION: 753860
MDC IDC MSMT BATTERY REMAINING LONGEVITY: 54 mo
MDC IDC MSMT BATTERY VOLTAGE: 2.98 V
MDC IDC MSMT LEADCHNL LV IMPEDANCE VALUE: 450 Ohm
MDC IDC MSMT LEADCHNL LV PACING THRESHOLD AMPLITUDE: 0.75 V
MDC IDC MSMT LEADCHNL RA PACING THRESHOLD AMPLITUDE: 1 V
MDC IDC MSMT LEADCHNL RA PACING THRESHOLD PULSEWIDTH: 0.5 ms
MDC IDC MSMT LEADCHNL RV IMPEDANCE VALUE: 510 Ohm
MDC IDC MSMT LEADCHNL RV SENSING INTR AMPL: 12 mV
MDC IDC PG IMPLANT DT: 20160324
MDC IDC SET LEADCHNL LV PACING AMPLITUDE: 1 V
MDC IDC SET LEADCHNL LV PACING PULSEWIDTH: 0.5 ms
MDC IDC SET LEADCHNL RA PACING AMPLITUDE: 2 V
MDC IDC SET LEADCHNL RV PACING PULSEWIDTH: 0.5 ms
MDC IDC SET LEADCHNL RV SENSING SENSITIVITY: 0.5 mV
MDC IDC STAT BRADY RA PERCENT PACED: 1 %
Pulse Gen Serial Number: 7240350

## 2018-04-11 ENCOUNTER — Ambulatory Visit (INDEPENDENT_AMBULATORY_CARE_PROVIDER_SITE_OTHER): Payer: Medicare Other

## 2018-04-11 DIAGNOSIS — I5022 Chronic systolic (congestive) heart failure: Secondary | ICD-10-CM | POA: Diagnosis not present

## 2018-04-11 DIAGNOSIS — Z95810 Presence of automatic (implantable) cardiac defibrillator: Secondary | ICD-10-CM | POA: Diagnosis not present

## 2018-04-11 NOTE — Progress Notes (Signed)
EPIC Encounter for ICM Monitoring  Patient Name: Rickey Rice is a 78 y.o. male Date: 04/11/2018 Primary Care Physican: Lucianne Lei, MD Primary Cardiologist:[name] Electrophysiologist: Vergie Living Pacing: >99% Last weight311lbs  Spoke with patient.  He is asymptomatic.  He is planning on losing weight to improve his health.  Thoracic impedance normal.   Prescribed:Furosemide 40 mg 1 tablet daily.  Labs: 09/19/2017 Creatinine 1.20, BUN 18, Potassium 4.2, Sodium 147, EGFR 58-67 07/18/2016 Creatinine 1.09, BUN 17, Potassium 4.2, Sodium 145, EGR 76  Recommendations: No changes.   Encouraged to call for fluid symptoms.  Recommendations: No changes.   Encouraged to call for fluid symptoms.  Follow-up plan: ICM clinic phone appointment on 05/14/2018.    Copy of ICM check sent to Dr. [name].   3 month ICM trend: 04/11/2018    1 Year ICM trend:       Rosalene Billings, RN 04/11/2018 12:29 PM

## 2018-05-07 ENCOUNTER — Ambulatory Visit (HOSPITAL_COMMUNITY): Payer: Medicare Other | Attending: Cardiology

## 2018-05-07 DIAGNOSIS — I428 Other cardiomyopathies: Secondary | ICD-10-CM

## 2018-05-14 ENCOUNTER — Ambulatory Visit (INDEPENDENT_AMBULATORY_CARE_PROVIDER_SITE_OTHER): Payer: Medicare Other

## 2018-05-14 DIAGNOSIS — Z95810 Presence of automatic (implantable) cardiac defibrillator: Secondary | ICD-10-CM | POA: Diagnosis not present

## 2018-05-14 DIAGNOSIS — I5022 Chronic systolic (congestive) heart failure: Secondary | ICD-10-CM | POA: Diagnosis not present

## 2018-05-14 NOTE — Progress Notes (Signed)
EPIC Encounter for ICM Monitoring  Patient Name: Rickey Rice is a 78 y.o. male Date: 05/14/2018 Primary Care Physican: Lucianne Lei, MD Primary Cardiologist:[name] Electrophysiologist: Vergie Living Pacing: >99% Lastweight311lbs Today's Weight: 315 lbs  Heart failure questions reviewed. He is asymptomatic.  He is walking better since plantar fascitis has improved.  He has started exercising again at the [facility].  Thoracic impedance normal.   Prescribed:Furosemide 40 mg 1 tablet daily.  Labs: 09/19/2017 Creatinine 1.20, BUN 18, Potassium 4.2, Sodium 147, EGFR 58-67 07/18/2016 Creatinine 1.09, BUN 17, Potassium 4.2, Sodium 145, EGR 76  Recommendations:No changes. Encouraged to call for fluid symptoms.  Follow-up plan: ICM clinic phone appointment on 06/17/2018.    Copy of ICM check sent to Dr. [name].  3 month ICM trend: 05/14/2018    1 Year ICM trend:       Rosalene Billings, RN 05/14/2018 9:52 AM

## 2018-06-17 ENCOUNTER — Other Ambulatory Visit: Payer: Self-pay

## 2018-06-17 ENCOUNTER — Ambulatory Visit (INDEPENDENT_AMBULATORY_CARE_PROVIDER_SITE_OTHER): Payer: Medicare Other

## 2018-06-17 DIAGNOSIS — Z95810 Presence of automatic (implantable) cardiac defibrillator: Secondary | ICD-10-CM

## 2018-06-17 DIAGNOSIS — I5022 Chronic systolic (congestive) heart failure: Secondary | ICD-10-CM | POA: Diagnosis not present

## 2018-06-19 NOTE — Progress Notes (Signed)
EPIC Encounter for ICM Monitoring  Patient Name: Rickey Rice is a 78 y.o. male Date: 06/19/2018 Primary Care Physican: Lucianne Lei, MD Primary Cardiologist:[name] Electrophysiologist: Vergie Living Pacing: >99% Lastweight311lbs Today's Weight: 315 lbs 06/19/2018 Weight: 312 lbs   Heart failure questions reviewed.He is asymptomatic.   Thoracic impedance normal.   Prescribed:Furosemide 40 mg 1 tablet daily.  Labs: 09/19/2017 Creatinine 1.20, BUN 18, Potassium 4.2, Sodium 147, EGFR 58-67 07/18/2016 Creatinine 1.09, BUN 17, Potassium 4.2, Sodium 145, EGR 76  Recommendations:No changes. Encouraged to call for fluid symptoms.  Follow-up plan: ICM clinic phone appointment on4/27/2020.   Copy of ICM check sent to [place].  3 month ICM trend: 06/17/2018    1 Year ICM trend:       Rosalene Billings, RN 06/19/2018 11:18 AM

## 2018-07-21 ENCOUNTER — Other Ambulatory Visit: Payer: Self-pay | Admitting: Internal Medicine

## 2018-07-22 ENCOUNTER — Other Ambulatory Visit: Payer: Self-pay

## 2018-07-22 ENCOUNTER — Telehealth: Payer: Self-pay

## 2018-07-22 ENCOUNTER — Ambulatory Visit (INDEPENDENT_AMBULATORY_CARE_PROVIDER_SITE_OTHER): Payer: Medicare Other

## 2018-07-22 DIAGNOSIS — Z95810 Presence of automatic (implantable) cardiac defibrillator: Secondary | ICD-10-CM

## 2018-07-22 DIAGNOSIS — I5022 Chronic systolic (congestive) heart failure: Secondary | ICD-10-CM

## 2018-07-22 NOTE — Telephone Encounter (Signed)
Left message for patient to remind of missed remote transmission.  

## 2018-07-23 NOTE — Progress Notes (Signed)
EPIC Encounter for ICM Monitoring  Patient Name: Rickey Rice is a 77 y.o. male Date: 07/23/2018 Primary Care Physican: Bland, Veita, MD Primary Cardiologist:[name] Electrophysiologist: Klein Bi-V Pacing: >99% 06/19/2018 Weight: 312 lbs 07/23/2018 Weight: 311 lbs   Heart failure questions reviewed.He is asymptomatic.   Thoracic impedance normal.   Prescribed:Furosemide 40 mg 1 tablet daily.  Labs: 09/19/2017 Creatinine 1.20, BUN 18, Potassium 4.2, Sodium 147, EGFR 58-67 07/18/2016 Creatinine 1.09, BUN 17, Potassium 4.2, Sodium 145, EGR 76  Recommendations:No changes. Encouraged to call for fluid symptoms.  Follow-up plan: ICM clinic phone appointment on6/03/2018.   Copy of ICM check sent to Dr.Klein.  3 month ICM trend: 07/22/2018    1 Year ICM trend:       Laurie S Short, RN 07/23/2018 8:20 AM   

## 2018-07-31 ENCOUNTER — Encounter (HOSPITAL_COMMUNITY): Payer: Self-pay

## 2018-07-31 ENCOUNTER — Telehealth: Payer: Self-pay | Admitting: Internal Medicine

## 2018-07-31 ENCOUNTER — Emergency Department (HOSPITAL_COMMUNITY): Payer: Medicare Other

## 2018-07-31 ENCOUNTER — Emergency Department (HOSPITAL_COMMUNITY)
Admission: EM | Admit: 2018-07-31 | Discharge: 2018-07-31 | Disposition: A | Discharge status: Home / Self Care | Payer: Medicare Other | Attending: Emergency Medicine | Admitting: Emergency Medicine

## 2018-07-31 ENCOUNTER — Other Ambulatory Visit: Payer: Self-pay

## 2018-07-31 DIAGNOSIS — Z79899 Other long term (current) drug therapy: Secondary | ICD-10-CM | POA: Diagnosis not present

## 2018-07-31 DIAGNOSIS — E1122 Type 2 diabetes mellitus with diabetic chronic kidney disease: Secondary | ICD-10-CM | POA: Diagnosis not present

## 2018-07-31 DIAGNOSIS — N182 Chronic kidney disease, stage 2 (mild): Secondary | ICD-10-CM | POA: Insufficient documentation

## 2018-07-31 DIAGNOSIS — E039 Hypothyroidism, unspecified: Secondary | ICD-10-CM | POA: Diagnosis not present

## 2018-07-31 DIAGNOSIS — R42 Dizziness and giddiness: Secondary | ICD-10-CM

## 2018-07-31 DIAGNOSIS — Z8673 Personal history of transient ischemic attack (TIA), and cerebral infarction without residual deficits: Secondary | ICD-10-CM | POA: Insufficient documentation

## 2018-07-31 DIAGNOSIS — H81399 Other peripheral vertigo, unspecified ear: Secondary | ICD-10-CM | POA: Diagnosis not present

## 2018-07-31 DIAGNOSIS — Z95810 Presence of automatic (implantable) cardiac defibrillator: Secondary | ICD-10-CM | POA: Insufficient documentation

## 2018-07-31 DIAGNOSIS — Z7984 Long term (current) use of oral hypoglycemic drugs: Secondary | ICD-10-CM | POA: Diagnosis not present

## 2018-07-31 DIAGNOSIS — I129 Hypertensive chronic kidney disease with stage 1 through stage 4 chronic kidney disease, or unspecified chronic kidney disease: Secondary | ICD-10-CM | POA: Diagnosis not present

## 2018-07-31 DIAGNOSIS — Z96653 Presence of artificial knee joint, bilateral: Secondary | ICD-10-CM | POA: Insufficient documentation

## 2018-07-31 LAB — TROPONIN I: Troponin I: <0.03 ng/mL (ref <0.03)

## 2018-07-31 LAB — URINALYSIS, ROUTINE W REFLEX MICROSCOPIC
Bilirubin Urine: NEGATIVE
Glucose, UA: NEGATIVE mg/dL
Hgb urine dipstick: NEGATIVE
Ketones, ur: NEGATIVE mg/dL
Leukocytes,Ua: NEGATIVE
Nitrite: NEGATIVE
Protein, ur: NEGATIVE mg/dL
Specific Gravity, Urine: 1.016 (ref 1.005–1.030)
pH: 7 (ref 5.0–8.0)

## 2018-07-31 LAB — COMPREHENSIVE METABOLIC PANEL
ALT: 18 U/L (ref 0–44)
AST: 20 U/L (ref 15–41)
Albumin: 3.5 g/dL (ref 3.5–5.0)
Alkaline Phosphatase: 70 U/L (ref 38–126)
Anion gap: 10 (ref 5–15)
BUN: 13 mg/dL (ref 8–23)
CO2: 29 mmol/L (ref 22–32)
Calcium: 8.6 mg/dL — ABNORMAL LOW (ref 8.9–10.3)
Chloride: 105 mmol/L (ref 98–111)
Creatinine, Ser: 1.13 mg/dL (ref 0.61–1.24)
GFR calc Af Amer: >60 mL/min (ref >60)
GFR calc non Af Amer: >60 mL/min (ref >60)
Glucose, Bld: 143 mg/dL — ABNORMAL HIGH (ref 70–99)
Potassium: 3.8 mmol/L (ref 3.5–5.1)
Sodium: 144 mmol/L (ref 135–145)
Total Bilirubin: 0.6 mg/dL (ref 0.3–1.2)
Total Protein: 6.8 g/dL (ref 6.5–8.1)

## 2018-07-31 LAB — CBC
HCT: 37.1 % — ABNORMAL LOW (ref 39.0–52.0)
Hemoglobin: 12 g/dL — ABNORMAL LOW (ref 13.0–17.0)
MCH: 31.9 pg (ref 26.0–34.0)
MCHC: 32.3 g/dL (ref 30.0–36.0)
MCV: 98.7 fL (ref 80.0–100.0)
Platelets: 134 10*3/uL — ABNORMAL LOW (ref 150–400)
RBC: 3.76 MIL/uL — ABNORMAL LOW (ref 4.22–5.81)
RDW: 13.1 % (ref 11.5–15.5)
WBC: 4.4 10*3/uL (ref 4.0–10.5)
nRBC: 0 % (ref 0.0–0.2)

## 2018-07-31 LAB — RAPID URINE DRUG SCREEN, HOSP PERFORMED
Amphetamines: NOT DETECTED
Barbiturates: NOT DETECTED
Benzodiazepines: NOT DETECTED
Cocaine: NOT DETECTED
Opiates: NOT DETECTED
Tetrahydrocannabinol: NOT DETECTED

## 2018-07-31 LAB — DIFFERENTIAL
Abs Immature Granulocytes: 0 10*3/uL (ref 0.00–0.07)
Basophils Absolute: 0 10*3/uL (ref 0.0–0.1)
Basophils Relative: 1 %
Eosinophils Absolute: 0.2 10*3/uL (ref 0.0–0.5)
Eosinophils Relative: 4 %
Immature Granulocytes: 0 %
Lymphocytes Relative: 23 %
Lymphs Abs: 1 10*3/uL (ref 0.7–4.0)
Monocytes Absolute: 0.5 10*3/uL (ref 0.1–1.0)
Monocytes Relative: 12 %
Neutro Abs: 2.7 10*3/uL (ref 1.7–7.7)
Neutrophils Relative %: 60 %

## 2018-07-31 LAB — APTT: aPTT: 32 seconds (ref 24–36)

## 2018-07-31 LAB — PROTIME-INR
INR: 1.1 (ref 0.8–1.2)
Prothrombin Time: 13.8 seconds (ref 11.4–15.2)

## 2018-07-31 LAB — ETHANOL: Alcohol, Ethyl (B): <10 mg/dL (ref <10)

## 2018-07-31 MED ORDER — MECLIZINE HCL 12.5 MG PO TABS
25.0000 mg | ORAL_TABLET | Freq: Once | ORAL | Status: AC
  Administered 2018-07-31: 11:00:00 25 mg via ORAL
  Filled 2018-07-31: qty 2

## 2018-07-31 MED ORDER — MECLIZINE HCL 25 MG PO TABS: 25.0000 mg | ORAL_TABLET | Freq: Two times a day (BID) | ORAL | 0 refills | Status: DC | PRN

## 2018-07-31 MED ORDER — IOHEXOL 350 MG/ML SOLN
75.0000 mL | Freq: Once | INTRAVENOUS | Status: AC | PRN
  Administered 2018-07-31: 75 mL via INTRAVENOUS

## 2018-07-31 NOTE — Telephone Encounter (Signed)
Spoke with patient.  He reported feeling dizzy and lightheaded this morning around 7:00 AM. No other symptoms noted.  He reports he generally did not feel well.  He was fine yesterday and slept good last night.  He did go to the ER this morning and was given Meclizine for vertigo.  Received a remote transmission as requested and stated I would send this to the device clinic for review.  He said he feels better now.    Routed to device clinic to request to review Merlin remote transmission sent today.

## 2018-07-31 NOTE — Telephone Encounter (Signed)
Spoke with patient. Advised that transmission shows normal device function. No new episodes since last scheduled transmission. VT monitor zone programmed at 160bpm. He verbalizes understanding.  Pt reports the ED told him it wasn't a stroke that caused his symptoms. He received a prescription for meclizine, but hasn't filled it yet. He reached out to Dr. Lysbeth Penner office to schedule an appointment as instructed on his discharge paperwork.  Will route message to Dr. [name] as Juluis Rainier. Pt thanked me for this call and denies questions or concerns at this time.

## 2018-07-31 NOTE — Consult Note (Signed)
Medical Consultation   JAKHARI SPACE  OJJ:[id number]  DOB: [birth year]  DOA: 07/31/2018  PCP: Lucianne Lei, MD   Outpatient Specialists: Dr. Debara Pickett Cardiologist   Requesting physician: Dr. Thurnell Garbe  Reason for consultation: Observation for TIA vs Vertigo  History of Present Illness: Rickey Rice is an 78 y.o. male with history of cardiomyopathy and LVEF 25- 30% status post ICD/pacemaker, hypertension, dyslipidemia, hypothyroidism, obesity, and bilateral knee replacements who presented to the emergency department with sudden onset lightheadedness/dizziness that began at approximately 7 AM this morning as he was ambulating at home.  He denies any headache, visual changes, speech slurring, focal motor weakness, sensory deficit, palpitations, chest pain, shortness of breath, edema, or other symptomatology.  He has had no other symptoms leading up to this event and states that this is quite unusual for him.  He cannot describe the symptoms accurately much further than this and appears to deny any orthostasis.  He has been eating and drinking and taking his medications as usual.  In the emergency department, vital signs are stable and laboratory data without any significant findings.  Multiple imaging studies to include CT head and CTA were performed with no acute findings and MRI could not be performed on account of the ICD/pacemaker.  Tele-neurology was consulted with recommendations to continue meclizine and consider further work-up as needed, especially if symptomatic.  The patient currently claims that he is feeling much better and is back to his baseline and would rather go home on meclizine.  He is currently having no other symptomatology and there are no significant neurological deficits identified on examination.  Review of Systems:  ROS All others reviewed and otherwise negative.   Past Medical History: Past Medical History:  Diagnosis Date   AICD (automatic  cardioverter/defibrillator) present    Arthritis    oa   Cardiac arrest ([place]) 06/07/2014   Primary VF arrest with successful resuscitation and s/p ICD implant   Diabetes mellitus without complication ([place])    type 2   Dyslipidemia    History of nuclear stress test 04/2012   lexiscan - 2 day protocol; low risk study, evidence of inferrior & apical scar but no ischemia    Hypertension    Hypothyroidism    LBBB (left bundle branch block)    Morbid obesity ([facility])    NICM (nonischemic cardiomyopathy) ([place])    Prostate enlargement    pt denies    Past Surgical History: Past Surgical History:  Procedure Laterality Date   BI-VENTRICULAR IMPLANTABLE CARDIOVERTER DEFIBRILLATOR N/A 06/18/2014   STJ CRTD implanted by Dr [name]   COLONOSCOPY W/ BIOPSIES     COLONOSCOPY WITH PROPOFOL N/A 04/05/2017   Procedure: COLONOSCOPY WITH PROPOFOL;  Surgeon: Juanita Craver, MD;  Location: [facility];  Service: Endoscopy;  Laterality: N/A;   FINGER SURGERY  1954   1 st joint  right hand amputated   KNEE ARTHROSCOPY Left    LEFT HEART CATHETERIZATION WITH CORONARY ANGIOGRAM N/A 06/12/2014   Procedure: LEFT HEART CATHETERIZATION WITH CORONARY ANGIOGRAM;  Surgeon: Leonie Man, MD;  Location: [facility] CATH LAB;  Service: Cardiovascular;  Laterality: N/A;   [place]   TOTAL KNEE ARTHROPLASTY Right 02/05/2013   Procedure: TOTAL KNEE ARTHROPLASTY;  Surgeon: Kerin Salen, MD;  Location: [place];  Service: Orthopedics;  Laterality: Right;   TOTAL KNEE ARTHROPLASTY Left 01/19/2014   Procedure: LEFT TOTAL KNEE ARTHROPLASTY;  Surgeon:  Kerin Salen, MD;  Location: [place];  Service: Orthopedics;  Laterality: Left;     Allergies:   Allergies  Allergen Reactions   Bee Venom Swelling    Swelling on lips and tongue   Demerol [Meperidine]     Not sure   Pioglitazone     Lips swelling= actos   Shrimp [Shellfish Allergy]     rash   Sulfa Antibiotics     Not sure     Social  History:  reports that he has never smoked. He has never used smokeless tobacco. He reports current alcohol use. He reports that he does not use drugs.   Family History: Family History  Problem Relation Age of Onset   Cancer Mother    Kidney disease Brother    Hypertension Sister     Physical Exam: Vitals:   07/31/18 1300 07/31/18 1315 07/31/18 1330 07/31/18 1400  BP: 124/70  135/73 (!) 145/78  Pulse: (!) 51 (!) 51 (!) 57 (!) 54  Resp:   20 16  Temp:      TempSrc:      SpO2: 98% 99% 100% 99%  Weight:      Height:        Constitutional: Obese, alert and awake, oriented x3, not in any acute distress. Eyes: PERLA, EOMI, irises appear normal, anicteric sclera,  ENMT: external ears and nose appear normal, normal hearing            Lips appears normal, oropharynx mucosa, tongue, posterior pharynx appear normal  Neck: neck appears normal, no masses, normal ROM, no thyromegaly, no JVD  CVS: S1-S2 clear, no murmur rubs or gallops, no LE edema, normal pedal pulses  Respiratory:  clear to auscultation bilaterally, no wheezing, rales or rhonchi. Respiratory effort normal. No accessory muscle use.  Abdomen: soft nontender, nondistended, normal bowel sounds, no hepatosplenomegaly, no hernias  Musculoskeletal: : no cyanosis, clubbing or edema noted bilaterally              Neuro: Cranial nerves II-XII intact, strength, sensation, reflexes, no nystagmus noted on my examination Psych: judgement and insight appear normal, stable mood and affect, mental status Skin: no rashes or lesions or ulcers, no induration or nodules   Data reviewed:  I have personally reviewed following labs and imaging studies Labs:  CBC: Recent Labs  Lab 07/31/18 1021  WBC 4.4  NEUTROABS 2.7  HGB 12.0*  HCT 37.1*  MCV 98.7  PLT 134*    Basic Metabolic Panel: Recent Labs  Lab 07/31/18 1021  NA 144  K 3.8  CL 105  CO2 29  GLUCOSE 143*  BUN 13  CREATININE 1.13  CALCIUM 8.6*   GFR Estimated  Creatinine Clearance: 79 mL/min (by C-G formula based on SCr of 1.13 mg/dL). Liver Function Tests: Recent Labs  Lab 07/31/18 1021  AST 20  ALT 18  ALKPHOS 70  BILITOT 0.6  PROT 6.8  ALBUMIN 3.5   No results for input(s): LIPASE, AMYLASE in the last 168 hours. No results for input(s): AMMONIA in the last 168 hours. Coagulation profile Recent Labs  Lab 07/31/18 1021  INR 1.1    Cardiac Enzymes: Recent Labs  Lab 07/31/18 1021  TROPONINI <0.03   BNP: Invalid input(s): POCBNP CBG: No results for input(s): GLUCAP in the last 168 hours. D-Dimer No results for input(s): DDIMER in the last 72 hours. Hgb A1c No results for input(s): HGBA1C in the last 72 hours. Lipid Profile No results for input(s): CHOL, HDL, LDLCALC,  TRIG, CHOLHDL, LDLDIRECT in the last 72 hours. Thyroid function studies No results for input(s): TSH, T4TOTAL, T3FREE, THYROIDAB in the last 72 hours.  Invalid input(s): FREET3 Anemia work up No results for input(s): VITAMINB12, FOLATE, FERRITIN, TIBC, IRON, RETICCTPCT in the last 72 hours. Urinalysis    Component Value Date/Time   COLORURINE STRAW (A) 07/31/2018 1025   APPEARANCEUR CLEAR 07/31/2018 1025   LABSPEC 1.016 07/31/2018 1025   PHURINE 7.0 07/31/2018 1025   GLUCOSEU NEGATIVE 07/31/2018 [place] 07/31/2018 [place] 07/31/2018 [place] 07/31/2018 1025   PROTEINUR NEGATIVE 07/31/2018 1025   UROBILINOGEN 0.2 06/14/2014 0318   NITRITE NEGATIVE 07/31/2018 1025   [place] 07/31/2018 1025     Microbiology No results found for this or any previous visit (from the past 240 hour(s)).    Radiological Exams on Admission: Ct Angio Head W/cm &/or Wo Cm  Result Date: 07/31/2018 CLINICAL DATA:  Lightheaded and dizzy beginning this morning at 0800 hours. Vertigo. EXAM: CT ANGIOGRAPHY HEAD AND NECK TECHNIQUE: Multidetector CT imaging of the head and neck was performed using the standard  protocol during bolus administration of intravenous contrast. Multiplanar CT image reconstructions and MIPs were obtained to evaluate the vascular anatomy. Carotid stenosis measurements (when applicable) are obtained utilizing NASCET criteria, using the distal internal carotid diameter as the denominator. CONTRAST:  49mL OMNIPAQUE IOHEXOL 350 MG/ML SOLN COMPARISON:  09/07/2013 FINDINGS: CT HEAD FINDINGS Brain: Generalized atrophy. Mild chronic small-vessel changes of the cerebral hemispheric white matter. No sign of acute infarction, mass lesion, hemorrhage, hydrocephalus or extra-axial collection. Vascular: Extensive atherosclerotic change of the major vessels at the base of the brain. Skull: Negative Sinuses: Clear/normal Orbits: Normal Review of the MIP images confirms the above findings CTA NECK FINDINGS Aortic arch: Aortic atherosclerosis. No aneurysm or dissection. Branching pattern is normal. Right carotid system: Common carotid artery is widely patent to the bifurcation. Mild atherosclerotic plaque at the carotid bifurcation but no stenosis or irregularity. Cervical ICA is widely patent. Left carotid system: Common carotid artery widely patent to the bifurcation region. Mild atherosclerotic calcification at the ICA bulb but no stenosis. Cervical ICA widely patent beyond that. Vertebral arteries: Left vertebral artery origin is widely patent and normal. Calcified plaque at the right vertebral artery origin but without stenosis. Both vertebral arteries appear widely patent through the cervical region to the foramen magnum. Skeleton: Mid cervical chronic spondylosis and facet arthropathy. Other neck: No mass or lymphadenopathy. Upper chest: Lung apices are clear. Review of the MIP images confirms the above findings CTA HEAD FINDINGS Anterior circulation: Both internal carotid arteries are patent through the skull base and siphon regions. There is atherosclerotic calcification in the carotid siphon regions but  no stenosis. The anterior and middle cerebral vessels are patent without proximal stenosis, aneurysm or vascular malformation. Posterior circulation: Both vertebral arteries are patent at the foramen magnum and to the basilar. There is atherosclerotic calcification in both V4 segments. I do not see stenosis greater than 30%. The left vertebral artery is dolichoectatic and indents the brainstem. This is usually not clinically significant. No basilar stenosis. Posterior circulation branch vessels show flow. Venous sinuses: Patent and normal. Anatomic variants: None significant. 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 No sign of hemorrhage or calcification associated with that. Delayed phase: Not done. Review of the MIP images  confirms the above findings IMPRESSION: No acute vascular finding. Aortic atherosclerosis. Mild atherosclerosis of the carotid bifurcations but without stenosis or significant irregularity. Atherosclerotic change in the carotid siphon regions but without significant stenosis. Atherosclerotic change of the V4 segment vertebral arteries on both sides. The left is tortuous and indents the brainstem slightly. Usually this is not significant. No evidence of posterior circulation stenosis or vessel occlusion. Probable venous angiomas of the right parietal brain and the left dorsal mid brain. No sign of hemorrhage associated with these. These are quite likely incidental. Electronically Signed   By: Nelson Chimes M.D.   On: 07/31/2018 12:04   Dg Chest 2 View  Result Date: 07/31/2018 CLINICAL DATA:  Onset lightheadedness this morning. EXAM: CHEST - 2 VIEW COMPARISON:  PA and lateral chest 06/19/2014. FINDINGS: Pacing device/AICD is unchanged. Lungs are clear. Heart size is normal. No pneumothorax or pleural effusion. Eventration right hemidiaphragm noted. No acute  or focal bony abnormality. IMPRESSION: No acute disease.  Stable compared to prior exam. Electronically Signed   By: Inge Rise M.D.   On: 07/31/2018 11:19   Ct Head Wo Contrast  Result Date: 07/31/2018 CLINICAL DATA:  Lightheaded and dizzy beginning this morning at 0800 hours. Vertigo. EXAM: CT ANGIOGRAPHY HEAD AND NECK TECHNIQUE: Multidetector CT imaging of the head and neck was performed using the standard protocol during bolus administration of intravenous contrast. Multiplanar CT image reconstructions and MIPs were obtained to evaluate the vascular anatomy. Carotid stenosis measurements (when applicable) are obtained utilizing NASCET criteria, using the distal internal carotid diameter as the denominator. CONTRAST:  13mL OMNIPAQUE IOHEXOL 350 MG/ML SOLN COMPARISON:  09/07/2013 FINDINGS: CT HEAD FINDINGS Brain: Generalized atrophy. Mild chronic small-vessel changes of the cerebral hemispheric white matter. No sign of acute infarction, mass lesion, hemorrhage, hydrocephalus or extra-axial collection. Vascular: Extensive atherosclerotic change of the major vessels at the base of the brain. Skull: Negative Sinuses: Clear/normal Orbits: Normal Review of the MIP images confirms the above findings CTA NECK FINDINGS Aortic arch: Aortic atherosclerosis. No aneurysm or dissection. Branching pattern is normal. Right carotid system: Common carotid artery is widely patent to the bifurcation. Mild atherosclerotic plaque at the carotid bifurcation but no stenosis or irregularity. Cervical ICA is widely patent. Left carotid system: Common carotid artery widely patent to the bifurcation region. Mild atherosclerotic calcification at the ICA bulb but no stenosis. Cervical ICA widely patent beyond that. Vertebral arteries: Left vertebral artery origin is widely patent and normal. Calcified plaque at the right vertebral artery origin but without stenosis. Both vertebral arteries appear widely patent through the cervical  region to the foramen magnum. Skeleton: Mid cervical chronic spondylosis and facet arthropathy. Other neck: No mass or lymphadenopathy. Upper chest: Lung apices are clear. Review of the MIP images confirms the above findings CTA HEAD FINDINGS Anterior circulation: Both internal carotid arteries are patent through the skull base and siphon regions. There is atherosclerotic calcification in the carotid siphon regions but no stenosis. The anterior and middle cerebral vessels are patent without proximal stenosis, aneurysm or vascular malformation. Posterior circulation: Both vertebral arteries are patent at the foramen magnum and to the basilar. There is atherosclerotic calcification in both V4 segments. I do not see stenosis greater than 30%. The left vertebral artery is dolichoectatic and indents the brainstem. This is usually not clinically significant. No basilar stenosis. Posterior circulation branch vessels show flow. Venous sinuses: Patent and normal. Anatomic variants: None significant. 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 I think there is  also a developmental venous anomaly of the left dorsal mid brain that probably represents a venous angioma. No sign of hemorrhage or calcification associated with that. Delayed phase: Not done. Review of the MIP images confirms the above findings IMPRESSION: No acute vascular finding. Aortic atherosclerosis. Mild atherosclerosis of the carotid bifurcations but without stenosis or significant irregularity. Atherosclerotic change in the carotid siphon regions but without significant stenosis. Atherosclerotic change of the V4 segment vertebral arteries on both sides. The left is tortuous and indents the brainstem slightly. Usually this is not significant. No evidence of posterior circulation stenosis or vessel occlusion. Probable venous angiomas of the right parietal brain and the left dorsal mid brain. No sign of hemorrhage associated with these. These are  quite likely incidental. Electronically Signed   By: Nelson Chimes M.D.   On: 07/31/2018 12:04   Ct Angio Neck W Or Wo Contrast  Result Date: 07/31/2018 CLINICAL DATA:  Lightheaded and dizzy beginning this morning at 0800 hours. Vertigo. EXAM: CT ANGIOGRAPHY HEAD AND NECK TECHNIQUE: Multidetector CT imaging of the head and neck was performed using the standard protocol during bolus administration of intravenous contrast. Multiplanar CT image reconstructions and MIPs were obtained to evaluate the vascular anatomy. Carotid stenosis measurements (when applicable) are obtained utilizing NASCET criteria, using the distal internal carotid diameter as the denominator. CONTRAST:  24mL OMNIPAQUE IOHEXOL 350 MG/ML SOLN COMPARISON:  09/07/2013 FINDINGS: CT HEAD FINDINGS Brain: Generalized atrophy. Mild chronic small-vessel changes of the cerebral hemispheric white matter. No sign of acute infarction, mass lesion, hemorrhage, hydrocephalus or extra-axial collection. Vascular: Extensive atherosclerotic change of the major vessels at the base of the brain. Skull: Negative Sinuses: Clear/normal Orbits: Normal Review of the MIP images confirms the above findings CTA NECK FINDINGS Aortic arch: Aortic atherosclerosis. No aneurysm or dissection. Branching pattern is normal. Right carotid system: Common carotid artery is widely patent to the bifurcation. Mild atherosclerotic plaque at the carotid bifurcation but no stenosis or irregularity. Cervical ICA is widely patent. Left carotid system: Common carotid artery widely patent to the bifurcation region. Mild atherosclerotic calcification at the ICA bulb but no stenosis. Cervical ICA widely patent beyond that. Vertebral arteries: Left vertebral artery origin is widely patent and normal. Calcified plaque at the right vertebral artery origin but without stenosis. Both vertebral arteries appear widely patent through the cervical region to the foramen magnum. Skeleton: Mid cervical  chronic spondylosis and facet arthropathy. Other neck: No mass or lymphadenopathy. Upper chest: Lung apices are clear. Review of the MIP images confirms the above findings CTA HEAD FINDINGS Anterior circulation: Both internal carotid arteries are patent through the skull base and siphon regions. There is atherosclerotic calcification in the carotid siphon regions but no stenosis. The anterior and middle cerebral vessels are patent without proximal stenosis, aneurysm or vascular malformation. Posterior circulation: Both vertebral arteries are patent at the foramen magnum and to the basilar. There is atherosclerotic calcification in both V4 segments. I do not see stenosis greater than 30%. The left vertebral artery is dolichoectatic and indents the brainstem. This is usually not clinically significant. No basilar stenosis. Posterior circulation branch vessels show flow. Venous sinuses: Patent and normal. Anatomic variants: None significant. 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 No sign of hemorrhage or calcification associated with that. Delayed phase: Not done. Review of the MIP images confirms the above findings IMPRESSION: No acute  vascular finding. Aortic atherosclerosis. Mild atherosclerosis of the carotid bifurcations but without stenosis or significant irregularity. Atherosclerotic change in the carotid siphon regions but without significant stenosis. Atherosclerotic change of the V4 segment vertebral arteries on both sides. The left is tortuous and indents the brainstem slightly. Usually this is not significant. No evidence of posterior circulation stenosis or vessel occlusion. Probable venous angiomas of the right parietal brain and the left dorsal mid brain. No sign of hemorrhage associated with these. These are quite likely incidental. Electronically Signed   By: Nelson Chimes M.D.   On: 07/31/2018 12:04    Impression/Recommendations Active Problems:   * No active hospital problems. *   1. Acute onset of vertigo versus presyncope versus TIA.  It appears that patient may have had more a component of vertigo as his symptoms have improved with administration of meclizine.  Telemetry monitoring does not demonstrate any significant arrhythmias and he is currently in paced rhythm.  No signs of stroke noted on imaging, but there could be possible TIA for which I will recommend further follow-up with his PCP to ensure that lipid panel and hemoglobin A1c are evaluated for stroke risk mitigation.  Patient does feel comfortable at this time to go home and ambulates with a cane and does have his wife at home.  I have discussed with EDP using meclizine as needed for vertigo or dizziness symptoms on discharge.  He will follow-up with his cardiologist Dr. Debara Pickett in the near future as well.  I have discussed with the patient coming back to the hospital for telemetry monitoring and further evaluation if symptoms continue to persist.  Time Spent: 30 minutes  Jaanai Salemi Darleen Crocker M.D. [facility] 07/31/2018, 2:28 PM

## 2018-07-31 NOTE — Telephone Encounter (Signed)
New Message              Patient's wife is calling because her husband is  in the ED and she would like a call back to see if Dr. Debara Pickett can see her husband. Pls call to advise.

## 2018-07-31 NOTE — Telephone Encounter (Signed)
Patient would like for Rickey Rice to give him a call.

## 2018-07-31 NOTE — Discharge Instructions (Signed)
Take the prescription as directed. Move slowly when changing positions. Eat regular meals and keep yourself hydrated. Call your regular Cardiologist today to schedule a follow up appointment within the next 2 days.  Return to the Emergency Department immediately sooner if worsening.

## 2018-07-31 NOTE — ED Triage Notes (Signed)
Pt reports feeling lightheaded after getting ready this morning around 0800.  Denies other stroke symptoms.

## 2018-07-31 NOTE — Telephone Encounter (Signed)
Called the patients wife and explained that Dr. Debara Pickett was in clinic and if cardiology needed to see the patient, there were cardiologist that were covering each of the hospitals.

## 2018-07-31 NOTE — ED Provider Notes (Signed)
[facility] EMERGENCY DEPARTMENT Provider Note   CSN: [id number] Arrival date & time: 07/31/18  1003    History   Chief Complaint Chief Complaint  Patient presents with   Dizziness    HPI Rickey Rice is a 78 y.o. male.     HPI  Pt was seen at 1015. Per pt, c/o gradual onset and persistence of multiple intermittent episodes of "lightheadedness" and "keep falling to the side" that he noticed after he woke up and walked into his kitchen approximately 0700 this morning. Pt states he "just feels like I'm going to fall over." Denies vertigo symptoms of spinning or sense of movement. Denies near syncope/syncope symptoms. Denies CP/palpitations, no SOB/cough, no abd pain, no N/V/D, no visual changes, no focal motor weakness, no tingling/numbness in extremities, no slurred speech, no facial droop. Denies fevers, no cough, no known COVID+ contacts.    Past Medical History:  Diagnosis Date   AICD (automatic cardioverter/defibrillator) present    Arthritis    oa   Cardiac arrest ([place]) 06/07/2014   Primary VF arrest with successful resuscitation and s/p ICD implant   Diabetes mellitus without complication ([place])    type 2   Dyslipidemia    History of nuclear stress test 04/2012   lexiscan - 2 day protocol; low risk study, evidence of inferrior & apical scar but no ischemia    Hypertension    Hypothyroidism    LBBB (left bundle branch block)    Morbid obesity ([place])    NICM (nonischemic cardiomyopathy) ([place])    Prostate enlargement    pt denies    Patient Active Problem List   Diagnosis Date Noted   Salivary stone 02/01/2018   Impacted cerumen of both ears 05/16/2017   Sensorineural hearing loss (SNHL), bilateral 05/16/2017   Encounter for immunization 02/15/2016   VT (ventricular tachycardia) (HCC) 36/46/8032   Chronic systolic congestive heart failure ([place]) 09/24/2015   S/P implantation of automatic cardioverter/defibrillator (AICD) 07/26/2014   CKD  (chronic kidney disease) stage 2, GFR 60-89 ml/min 06/11/2014   DM type 2 causing renal disease ([place]) 06/11/2014   BPH (benign prostatic hypertrophy) 06/11/2014   Obesity, morbid ([place]) 06/11/2014   Essential hypertension    NICM (nonischemic cardiomyopathy) ([place]) 06/08/2014   History of cardiac arrest    PEA (Pulseless electrical activity) ([place]) 06/07/2014   Degenerative arthritis of left knee 01/19/2014   Arthritis of left knee 01/19/2014   Unspecified constipation 03/31/2013   Difficulty in walking(719.7) 03/12/2013   Stiffness of right knee 03/12/2013   Right leg weakness 03/12/2013   Osteoarthritis of right knee 02/06/2013   LBBB (left bundle branch block) 01/07/2013   DM2 (diabetes mellitus, type 2) ([place]) 01/07/2013   Hypothyroidism 01/07/2013   Morbid obesity ([place]) 01/07/2013   Dyslipidemia 01/07/2013    Past Surgical History:  Procedure Laterality Date   BI-VENTRICULAR IMPLANTABLE CARDIOVERTER DEFIBRILLATOR N/A 06/18/2014   STJ CRTD implanted by Dr [name]   COLONOSCOPY W/ BIOPSIES     COLONOSCOPY WITH PROPOFOL N/A 04/05/2017   Procedure: COLONOSCOPY WITH PROPOFOL;  Surgeon: Juanita Craver, MD;  Location: [facility];  Service: Endoscopy;  Laterality: N/A;   FINGER SURGERY  1954   1 st joint  right hand amputated   KNEE ARTHROSCOPY Left    LEFT HEART CATHETERIZATION WITH CORONARY ANGIOGRAM N/A 06/12/2014   Procedure: LEFT HEART CATHETERIZATION WITH CORONARY ANGIOGRAM;  Surgeon: Leonie Man, MD;  Location: [facility] CATH LAB;  Service: Cardiovascular;  Laterality: N/A;   MOUTH SURGERY  1963   TOTAL KNEE ARTHROPLASTY Right 02/05/2013   Procedure: TOTAL KNEE ARTHROPLASTY;  Surgeon: Kerin Salen, MD;  Location: [place];  Service: Orthopedics;  Laterality: Right;   TOTAL KNEE ARTHROPLASTY Left 01/19/2014   Procedure: LEFT TOTAL KNEE ARTHROPLASTY;  Surgeon: Kerin Salen, MD;  Location: [place];  Service: Orthopedics;  Laterality: Left;        Home  Medications    Prior to Admission medications   Medication Sig Start Date End Date Taking? Authorizing Provider  acetaminophen (TYLENOL) 500 MG tablet Take 500 mg by mouth daily as needed for moderate pain or headache.    [provider]  amoxicillin (AMOXIL) 500 MG capsule Take 2,000 mg by mouth as directed. Prior to dental/periodontal appointments 04/22/15   [provider]  aspirin EC 81 MG tablet Take 81 mg by mouth daily.    [provider]  carboxymethylcellulose (REFRESH PLUS) 0.5 % SOLN Place 1 drop into both eyes daily as needed (dry eyes).    [provider]  carvedilol (COREG) 12.5 MG tablet TAKE 1 TABLET BY MOUTH TWICE DAILY WITH A MEAL. 07/23/18   Deboraha Sprang, MD  furosemide (LASIX) 40 MG tablet TAKE 1 TABLET BY MOUTH EVERY DAY 02/04/18   [name], Nadean Corwin, MD  glimepiride (AMARYL) 4 MG tablet Take 8 mg by mouth daily before breakfast.     [provider]  glucose blood (ONE TOUCH ULTRA TEST) test strip USE AS DIRECTED TO CHECK BLOOD SUGAR BID 01/21/18   [provider]  levothyroxine (SYNTHROID, LEVOTHROID) 75 MCG tablet Take 75 mcg by mouth every evening. Monday through Saturday    [provider]  Multiple Vitamins-Minerals (ICAPS AREDS 2) CAPS Take 1 capsule by mouth 2 (two) times daily.    [provider]  Omega-3 Fatty Acids (OMEGA 3 PO) Take 1,600 mg by mouth every other day.    [provider]  rosuvastatin (CRESTOR) 10 MG tablet Take 1 tablet (10 mg total) by mouth daily at 6 PM. 06/19/14   Mikhail, Velta Addison, DO  sacubitril-valsartan (ENTRESTO) 49-51 MG Take 1 tablet by mouth 2 (two) times daily. 12/28/17   [name], Nadean Corwin, MD  saxagliptin HCl (ONGLYZA) 5 MG TABS tablet Take 5 mg by mouth daily.    [provider]  TURMERIC CURCUMIN PO Take 1 tablet by mouth every other day.    [provider]    Family History Family History  Problem Relation Age of Onset   Cancer Mother      Kidney disease Brother    Hypertension Sister     Social History Social History   Tobacco Use   Smoking status: Never Smoker   Smokeless tobacco: Never Used  Substance Use Topics   Alcohol use: Yes    Comment: rare wine use   Drug use: No     Allergies   Bee venom; Demerol [meperidine]; Pioglitazone; Shrimp [shellfish allergy]; and Sulfa antibiotics   Review of Systems Review of Systems ROS: Statement: All systems negative except as marked or noted in the HPI; Constitutional: Negative for fever and chills. ; ; Eyes: Negative for eye pain, redness and discharge. ; ; ENMT: Negative for ear pain, hoarseness, nasal congestion, sinus pressure and sore throat. ; ; Cardiovascular: Negative for chest pain, palpitations, diaphoresis, dyspnea and peripheral edema. ; ; Respiratory: Negative for cough, wheezing and stridor. ; ; Gastrointestinal: Negative for nausea, vomiting, diarrhea, abdominal pain, blood in stool, hematemesis, jaundice and rectal bleeding. . ; ;  Genitourinary: Negative for dysuria, flank pain and hematuria. ; ; Musculoskeletal: Negative for back pain and neck pain. Negative for swelling and trauma.; ; Skin: Negative for pruritus, rash, abrasions, blisters, bruising and skin lesion.; ; Neuro: +lightheadedness, "falling to the side."  Negative for headache and neck stiffness. Negative for weakness, altered level of consciousness, altered mental status, extremity weakness, paresthesias, involuntary movement, seizure and syncope.       Physical Exam Updated Vital Signs BP 127/64    Pulse (!) 55    Temp 97.8 F (36.6 C) (Oral)    Resp 20    Ht 5\' 11"  (1.803 m)    Wt (!) 142 kg    SpO2 95%    BMI 43.65 kg/m    Patient Vitals for the past 24 hrs:  BP Temp Temp src Pulse Resp SpO2 Height Weight  07/31/18 1400 (!) 145/78 -- -- (!) 54 16 99 % -- --  07/31/18 1330 135/73 -- -- (!) 57 20 100 % -- --  07/31/18 1315 -- -- -- (!) 51 -- 99 % -- --  07/31/18 1300 124/70 -- --  (!) 51 -- 98 % -- --  07/31/18 1254 132/71 -- -- (!) 55 (!) 21 99 % -- --  07/31/18 1102 (!) 120/57 -- -- (!) 54 (!) 21 97 % -- --  07/31/18 1044 132/73 -- -- 71 -- 100 % -- --  07/31/18 1040 122/68 -- -- (!) 58 19 99 % -- --  07/31/18 1030 127/64 -- -- (!) 55 20 95 % -- --  07/31/18 1016 124/80 97.8 F (36.6 C) Oral 63 20 97 % -- --  07/31/18 1013 -- -- -- -- -- -- 5\' 11"  (1.803 m) (!) 142 kg     10:46 Orthostatic Vital Signs VP  Orthostatic Lying   BP- Lying: 122/68  Pulse- Lying: 61      Orthostatic Sitting  BP- Sitting: 116/68  Pulse- Sitting: 67      Orthostatic Standing at 0 minutes  BP- Standing at 0 minutes: 132/73  Pulse- Standing at 0 minutes: 74    Physical Exam 1020: Physical examination:  Nursing notes reviewed; Vital signs and O2 SAT reviewed;  Constitutional: Well developed, Well nourished, Well hydrated, In no acute distress; Head:  Normocephalic, atraumatic; Eyes: EOMI, PERRL, No scleral icterus; ENMT: TM's clear bilat. +edemetous nasal turbinates bilat with clear rhinorrhea. Mouth and pharynx normal, Mucous membranes moist; Neck: Supple, Full range of motion, No lymphadenopathy; Cardiovascular: Regular rate and rhythm, No gallop; Respiratory: Breath sounds clear & equal bilaterally, No wheezes.  Speaking full sentences with ease, Normal respiratory effort/excursion; Chest: Nontender, Movement normal; Abdomen: Soft, Nontender, Nondistended, Normal bowel sounds; Genitourinary: No CVA tenderness; Extremities: Peripheral pulses normal, No tenderness, +1 pedal edema bilat. No calf asymmetry.; Neuro: AA&Ox3, tangential historian.  Major CN grossly intact. Speech clear.  No facial droop. +left horizontal end gaze fatigable nystagmus. Grips equal. Strength 5/5 equal bilat UE's and LE's.  DTR 2/4 equal bilat UE's and LE's.  No gross sensory deficits.  Normal cerebellar testing bilat UE's (finger-nose) and LE's (heel-shin).; Skin: Color normal, Warm, Dry.     ED Treatments  / Results  Labs (all labs ordered are listed, but only abnormal results are displayed)   EKG EKG Interpretation  Date/Time:  Wednesday Jul 31 2018 10:13:58 EDT Ventricular Rate:  61 PR Interval:    QRS Duration: 164 QT Interval:  462 QTC Calculation: 466 R Axis:   -92 Text Interpretation:  Atrial-sensed ventricular-paced  rhythm No further analysis attempted due to paced rhythm When compared with ECG of 12/28/2017 No significant change was found Confirmed by Francine Graven [phone]) on 07/31/2018 10:31:58 AM   Radiology   Procedures Procedures (including critical care time)  Medications Ordered in ED Medications  meclizine (ANTIVERT) tablet 25 mg (25 mg Oral Given 07/31/18 1037)     Initial Impression / Assessment and Plan / ED Course  I have reviewed the triage vital signs and the nursing notes.  Pertinent labs & imaging results that were available during my care of the patient were reviewed by me and considered in my medical decision making (see chart for details).     MDM Reviewed: previous chart, nursing note and vitals Reviewed previous: labs and ECG Interpretation: labs, ECG, CT scan and x-ray    Results for orders placed or performed during the hospital encounter of 07/31/18  Ethanol  Result Value Ref Range   Alcohol, Ethyl (B) <10 <10 mg/dL  Protime-INR  Result Value Ref Range   Prothrombin Time 13.8 11.4 - 15.2 seconds   INR 1.1 0.8 - 1.2  APTT  Result Value Ref Range   aPTT 32 24 - 36 seconds  CBC  Result Value Ref Range   WBC 4.4 4.0 - 10.5 K/uL   RBC 3.76 (L) 4.22 - 5.81 MIL/uL   Hemoglobin 12.0 (L) 13.0 - 17.0 g/dL   HCT 37.1 (L) 39.0 - 52.0 %   MCV 98.7 80.0 - 100.0 fL   MCH 31.9 26.0 - 34.0 pg   MCHC 32.3 30.0 - 36.0 g/dL   RDW 13.1 11.5 - 15.5 %   Platelets 134 (L) 150 - 400 K/uL   nRBC 0.0 0.0 - 0.2 %  Differential  Result Value Ref Range   Neutrophils Relative % 60 %   Neutro Abs 2.7 1.7 - 7.7 K/uL   Lymphocytes Relative 23 %    Lymphs Abs 1.0 0.7 - 4.0 K/uL   Monocytes Relative 12 %   Monocytes Absolute 0.5 0.1 - 1.0 K/uL   Eosinophils Relative 4 %   Eosinophils Absolute 0.2 0.0 - 0.5 K/uL   Basophils Relative 1 %   Basophils Absolute 0.0 0.0 - 0.1 K/uL   Immature Granulocytes 0 %   Abs Immature Granulocytes 0.00 0.00 - 0.07 K/uL  Comprehensive metabolic panel  Result Value Ref Range   Sodium 144 135 - 145 mmol/L   Potassium 3.8 3.5 - 5.1 mmol/L   Chloride 105 98 - 111 mmol/L   CO2 29 22 - 32 mmol/L   Glucose, Bld 143 (H) 70 - 99 mg/dL   BUN 13 8 - 23 mg/dL   Creatinine, Ser 1.13 0.61 - 1.24 mg/dL   Calcium 8.6 (L) 8.9 - 10.3 mg/dL   Total Protein 6.8 6.5 - 8.1 g/dL   Albumin 3.5 3.5 - 5.0 g/dL   AST 20 15 - 41 U/L   ALT 18 0 - 44 U/L   Alkaline Phosphatase 70 38 - 126 U/L   Total Bilirubin 0.6 0.3 - 1.2 mg/dL   GFR calc non Af Amer >60 >60 mL/min   GFR calc Af Amer >60 >60 mL/min   Anion gap 10 5 - 15  Urine rapid drug screen (hosp performed)not at [facility]  Result Value Ref Range   Opiates NONE DETECTED NONE DETECTED   Cocaine NONE DETECTED NONE DETECTED   Benzodiazepines NONE DETECTED NONE DETECTED   Amphetamines NONE DETECTED NONE DETECTED   Tetrahydrocannabinol NONE DETECTED NONE DETECTED  Barbiturates NONE DETECTED NONE DETECTED  Urinalysis, Routine w reflex microscopic  Result Value Ref Range   Color, Urine STRAW (A) YELLOW   APPearance CLEAR CLEAR   Specific Gravity, Urine 1.016 1.005 - 1.030   pH 7.0 5.0 - 8.0   Glucose, UA NEGATIVE NEGATIVE mg/dL   Hgb urine dipstick NEGATIVE NEGATIVE   Bilirubin Urine NEGATIVE NEGATIVE   Ketones, ur NEGATIVE NEGATIVE mg/dL   Protein, ur NEGATIVE NEGATIVE mg/dL   Nitrite NEGATIVE NEGATIVE   Leukocytes,Ua NEGATIVE NEGATIVE  Troponin I - ONCE - STAT  Result Value Ref Range   Troponin I <0.03 <0.03 ng/mL   Ct Angio Head W/cm &/or Wo Cm Result Date: 07/31/2018 CLINICAL DATA:  Lightheaded and dizzy beginning this morning at 0800 hours. Vertigo. EXAM:  CT ANGIOGRAPHY HEAD AND NECK TECHNIQUE: Multidetector CT imaging of the head and neck was performed using the standard protocol during bolus administration of intravenous contrast. Multiplanar CT image reconstructions and MIPs were obtained to evaluate the vascular anatomy. Carotid stenosis measurements (when applicable) are obtained utilizing NASCET criteria, using the distal internal carotid diameter as the denominator. CONTRAST:  75mL OMNIPAQUE IOHEXOL 350 MG/ML SOLN COMPARISON:  09/07/2013 FINDINGS: CT HEAD FINDINGS Brain: Generalized atrophy. Mild chronic small-vessel changes of the cerebral hemispheric white matter. No sign of acute infarction, mass lesion, hemorrhage, hydrocephalus or extra-axial collection. Vascular: Extensive atherosclerotic change of the major vessels at the base of the brain. Skull: Negative Sinuses: Clear/normal Orbits: Normal Review of the MIP images confirms the above findings CTA NECK FINDINGS Aortic arch: Aortic atherosclerosis. No aneurysm or dissection. Branching pattern is normal. Right carotid system: Common carotid artery is widely patent to the bifurcation. Mild atherosclerotic plaque at the carotid bifurcation but no stenosis or irregularity. Cervical ICA is widely patent. Left carotid system: Common carotid artery widely patent to the bifurcation region. Mild atherosclerotic calcification at the ICA bulb but no stenosis. Cervical ICA widely patent beyond that. Vertebral arteries: Left vertebral artery origin is widely patent and normal. Calcified plaque at the right vertebral artery origin but without stenosis. Both vertebral arteries appear widely patent through the cervical region to the foramen magnum. Skeleton: Mid cervical chronic spondylosis and facet arthropathy. Other neck: No mass or lymphadenopathy. Upper chest: Lung apices are clear. Review of the MIP images confirms the above findings CTA HEAD FINDINGS Anterior circulation: Both internal carotid arteries are  patent through the skull base and siphon regions. There is atherosclerotic calcification in the carotid siphon regions but no stenosis. The anterior and middle cerebral vessels are patent without proximal stenosis, aneurysm or vascular malformation. Posterior circulation: Both vertebral arteries are patent at the foramen magnum and to the basilar. There is atherosclerotic calcification in both V4 segments. I do not see stenosis greater than 30%. The left vertebral artery is dolichoectatic and indents the brainstem. This is usually not clinically significant. No basilar stenosis. Posterior circulation branch vessels show flow. Venous sinuses: Patent and normal. Anatomic variants: None significant. 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 No sign of hemorrhage or calcification associated with that. Delayed phase: Not done. Review of the MIP images confirms the above findings IMPRESSION: No acute vascular finding. Aortic atherosclerosis. Mild atherosclerosis of the carotid bifurcations but without stenosis or significant irregularity. Atherosclerotic change in the carotid siphon regions but without significant stenosis. Atherosclerotic change of the V4 segment vertebral arteries on both  sides. The left is tortuous and indents the brainstem slightly. Usually this is not significant. No evidence of posterior circulation stenosis or vessel occlusion. Probable venous angiomas of the right parietal brain and the left dorsal mid brain. No sign of hemorrhage associated with these. These are quite likely incidental. Electronically Signed   By: Nelson Chimes M.D.   On: 07/31/2018 12:04   Dg Chest 2 View Result Date: 07/31/2018 CLINICAL DATA:  Onset lightheadedness this morning. EXAM: CHEST - 2 VIEW COMPARISON:  PA and lateral chest 06/19/2014. FINDINGS: Pacing device/AICD is unchanged.  Lungs are clear. Heart size is normal. No pneumothorax or pleural effusion. Eventration right hemidiaphragm noted. No acute or focal bony abnormality. IMPRESSION: No acute disease.  Stable compared to prior exam. Electronically Signed   By: Inge Rise M.D.   On: 07/31/2018 11:19   Ct Head Wo Contrast Result Date: 07/31/2018 CLINICAL DATA:  Lightheaded and dizzy beginning this morning at 0800 hours. Vertigo. EXAM: CT ANGIOGRAPHY HEAD AND NECK TECHNIQUE: Multidetector CT imaging of the head and neck was performed using the standard protocol during bolus administration of intravenous contrast. Multiplanar CT image reconstructions and MIPs were obtained to evaluate the vascular anatomy. Carotid stenosis measurements (when applicable) are obtained utilizing NASCET criteria, using the distal internal carotid diameter as the denominator. CONTRAST:  56mL OMNIPAQUE IOHEXOL 350 MG/ML SOLN COMPARISON:  09/07/2013 FINDINGS: CT HEAD FINDINGS Brain: Generalized atrophy. Mild chronic small-vessel changes of the cerebral hemispheric white matter. No sign of acute infarction, mass lesion, hemorrhage, hydrocephalus or extra-axial collection. Vascular: Extensive atherosclerotic change of the major vessels at the base of the brain. Skull: Negative Sinuses: Clear/normal Orbits: Normal Review of the MIP images confirms the above findings CTA NECK FINDINGS Aortic arch: Aortic atherosclerosis. No aneurysm or dissection. Branching pattern is normal. Right carotid system: Common carotid artery is widely patent to the bifurcation. Mild atherosclerotic plaque at the carotid bifurcation but no stenosis or irregularity. Cervical ICA is widely patent. Left carotid system: Common carotid artery widely patent to the bifurcation region. Mild atherosclerotic calcification at the ICA bulb but no stenosis. Cervical ICA widely patent beyond that. Vertebral arteries: Left vertebral artery origin is widely patent and normal. Calcified plaque at  the right vertebral artery origin but without stenosis. Both vertebral arteries appear widely patent through the cervical region to the foramen magnum. Skeleton: Mid cervical chronic spondylosis and facet arthropathy. Other neck: No mass or lymphadenopathy. Upper chest: Lung apices are clear. Review of the MIP images confirms the above findings CTA HEAD FINDINGS Anterior circulation: Both internal carotid arteries are patent through the skull base and siphon regions. There is atherosclerotic calcification in the carotid siphon regions but no stenosis. The anterior and middle cerebral vessels are patent without proximal stenosis, aneurysm or vascular malformation. Posterior circulation: Both vertebral arteries are patent at the foramen magnum and to the basilar. There is atherosclerotic calcification in both V4 segments. I do not see stenosis greater than 30%. The left vertebral artery is dolichoectatic and indents the brainstem. This is usually not clinically significant. No basilar stenosis. Posterior circulation branch vessels show flow. Venous sinuses: Patent and normal. Anatomic variants: None significant. 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 No sign of hemorrhage or calcification associated with that. Delayed phase: Not done. Review of the MIP images confirms the above findings IMPRESSION: No acute vascular finding. Aortic atherosclerosis.  Mild atherosclerosis of the carotid bifurcations but without stenosis or significant irregularity. Atherosclerotic change in the carotid siphon regions but without significant stenosis. Atherosclerotic change of the V4 segment vertebral arteries on both sides. The left is tortuous and indents the brainstem slightly. Usually this is not significant. No evidence of posterior circulation stenosis or vessel occlusion. Probable venous  angiomas of the right parietal brain and the left dorsal mid brain. No sign of hemorrhage associated with these. These are quite likely incidental. Electronically Signed   By: Nelson Chimes M.D.   On: 07/31/2018 12:04   Ct Angio Neck W Or Wo Contrast Result Date: 07/31/2018 CLINICAL DATA:  Lightheaded and dizzy beginning this morning at 0800 hours. Vertigo. EXAM: CT ANGIOGRAPHY HEAD AND NECK TECHNIQUE: Multidetector CT imaging of the head and neck was performed using the standard protocol during bolus administration of intravenous contrast. Multiplanar CT image reconstructions and MIPs were obtained to evaluate the vascular anatomy. Carotid stenosis measurements (when applicable) are obtained utilizing NASCET criteria, using the distal internal carotid diameter as the denominator. CONTRAST:  10mL OMNIPAQUE IOHEXOL 350 MG/ML SOLN COMPARISON:  09/07/2013 FINDINGS: CT HEAD FINDINGS Brain: Generalized atrophy. Mild chronic small-vessel changes of the cerebral hemispheric white matter. No sign of acute infarction, mass lesion, hemorrhage, hydrocephalus or extra-axial collection. Vascular: Extensive atherosclerotic change of the major vessels at the base of the brain. Skull: Negative Sinuses: Clear/normal Orbits: Normal Review of the MIP images confirms the above findings CTA NECK FINDINGS Aortic arch: Aortic atherosclerosis. No aneurysm or dissection. Branching pattern is normal. Right carotid system: Common carotid artery is widely patent to the bifurcation. Mild atherosclerotic plaque at the carotid bifurcation but no stenosis or irregularity. Cervical ICA is widely patent. Left carotid system: Common carotid artery widely patent to the bifurcation region. Mild atherosclerotic calcification at the ICA bulb but no stenosis. Cervical ICA widely patent beyond that. Vertebral arteries: Left vertebral artery origin is widely patent and normal. Calcified plaque at the right vertebral artery origin but without stenosis. Both  vertebral arteries appear widely patent through the cervical region to the foramen magnum. Skeleton: Mid cervical chronic spondylosis and facet arthropathy. Other neck: No mass or lymphadenopathy. Upper chest: Lung apices are clear. Review of the MIP images confirms the above findings CTA HEAD FINDINGS Anterior circulation: Both internal carotid arteries are patent through the skull base and siphon regions. There is atherosclerotic calcification in the carotid siphon regions but no stenosis. The anterior and middle cerebral vessels are patent without proximal stenosis, aneurysm or vascular malformation. Posterior circulation: Both vertebral arteries are patent at the foramen magnum and to the basilar. There is atherosclerotic calcification in both V4 segments. I do not see stenosis greater than 30%. The left vertebral artery is dolichoectatic and indents the brainstem. This is usually not clinically significant. No basilar stenosis. Posterior circulation branch vessels show flow. Venous sinuses: Patent and normal. Anatomic variants: None significant. 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 No sign of hemorrhage or calcification associated with that. Delayed phase: Not done. Review of the MIP images confirms the above findings IMPRESSION: No acute vascular finding. Aortic atherosclerosis. Mild atherosclerosis of the carotid bifurcations but without stenosis or significant irregularity. Atherosclerotic change in the carotid siphon regions but without significant stenosis. Atherosclerotic change of the V4 segment vertebral arteries on both sides. The left is tortuous and indents the brainstem slightly.  Usually this is not significant. No evidence of posterior circulation stenosis or vessel occlusion. Probable venous angiomas of the right parietal brain and the left dorsal mid  brain. No sign of hemorrhage associated with these. These are quite likely incidental. Electronically Signed   By: Nelson Chimes M.D.   On: 07/31/2018 12:04     1400:  Pt variable historian: will state to ED tech that he was "fine" when walking, but then told me he was "no better." ED Tech does state pt was steady when walking with his cane, and did not c/o lightheadedness during orthostatic VS. Neuro exam remains unchanged.  T/C returned from Tele Neuro Dr. Lyndel Safe, case discussed, including:  HPI, pertinent PM/SHx, VS/PE, dx testing, ED course and treatment: she has evaluated pt in the ED, pt is challenging historian, states pt has multiple risk factors for stroke so observation admit for TIA vs vertigo would be appropriate. Dx and testing d/w pt.  Questions answered.  Verb understanding, agreeable to admit.  1415:  T/C returned from [facility] Dr. Manuella Ghazi, case discussed, including:  HPI, pertinent PM/SHx, VS/PE, dx testing, ED course and treatment, as well as d/w Tele Neuro MD:  Agreeable to come to ED for evaluation for admission.  1445:  [facility] MD has come to the ED for pt evaluation (see consult note): Pt now states he does not want to be admitted and wants to go home, told [facility] MD he "felt better," his wife was home to help him prn, pt agreeable to f/u with Cards MD.  Will d/c stable.         Final Clinical Impressions(s) / ED Diagnoses   Final diagnoses:  None    ED Discharge Orders    None       Francine Graven, DO 08/03/18 1620

## 2018-07-31 NOTE — ED Notes (Signed)
Patient stated he did not have any dizziness upon sitting and standing.

## 2018-07-31 NOTE — ED Notes (Signed)
Patient transported to X-ray 

## 2018-07-31 NOTE — ED Notes (Signed)
Pt back from CT

## 2018-07-31 NOTE — Telephone Encounter (Signed)
Attempted call back to patient as requested and left message was returning his call.

## 2018-07-31 NOTE — Consult Note (Signed)
[facility] TeleNeurology Consult Services - stat consult  Impression:  Lightheadedness episode - stroke v BPPV v cardiac v other  Recommendations:  Stroke workup - pt w significant stroke risk factors unable to do MRI 2/2 ICD agree w antivert, consider vestibular therapy D/w ED MD  ---------------------------------------------------------------------  CC:  History of Present Illness:  78 yo M h/o Defribrillator, LBBB, HTN, CHF, bilat knee replacement, uses cane at baseline p/w lightheadedness, belching after waking up this morning.  Pt feels improvement in lightheadedness.  Diagnostic Testing: CT head - no abn  Vital Signs:    Exam:  Mental Status:  awake, alert, nad, oriented to age, month  Cranial Nerves:  Extraocular movements: intact Ptosis: Absent  Motor Exam:  no drift   Tremor/Abnormal Movements:  Resting tremor: Absent Intention tremor: Absent Postural tremor: Absent  Coordination:   Finger to nose: Intact   Medical Decision Making:  - Extensive number of diagnosis or management options are considered above.   - Extensive amount of complex data reviewed.   - High risk of complication and/or morbidity or mortality are associated with differential diagnostic considerations above.  - There may be uncertain outcome and increased probability of prolonged functional impairment or high probability of severe prolonged functional impairment associated with some of these differential diagnosis.   Medical Data Reviewed:  1.Data reviewed include clinical labs, radiology,  Medical Tests;   2.Tests results discussed w/performing or interpreting physician;   3.Obtaining/reviewing old medical records;  4.Obtaining case history from another source;  5.Independent review of image, tracing or specimen.    Patient was informed the Neurology Consult would happen via telehealth (remote video) and consented to receiving care in this manner.

## 2018-08-01 ENCOUNTER — Ambulatory Visit (INDEPENDENT_AMBULATORY_CARE_PROVIDER_SITE_OTHER): Payer: Medicare Other | Admitting: *Deleted

## 2018-08-01 DIAGNOSIS — I428 Other cardiomyopathies: Secondary | ICD-10-CM

## 2018-08-01 LAB — CUP PACEART REMOTE DEVICE CHECK
Date Time Interrogation Session: 20200507105226
Implantable Lead Implant Date: 20160324
Implantable Lead Implant Date: 20160324
Implantable Lead Implant Date: 20160324
Implantable Lead Location: 753858
Implantable Lead Location: 753859
Implantable Lead Location: 753860
Implantable Lead Model: 7122
Implantable Pulse Generator Implant Date: 20160324
Pulse Gen Serial Number: 7240350

## 2018-08-01 NOTE — Telephone Encounter (Signed)
noted 

## 2018-08-02 ENCOUNTER — Telehealth: Payer: Self-pay | Admitting: Internal Medicine

## 2018-08-02 NOTE — Telephone Encounter (Signed)
New message   Patient states that he was in [facility] ER this week and wants to speak to Dr. Debara Pickett oin reference to his visit.

## 2018-08-02 NOTE — Telephone Encounter (Signed)
Patient was seen in ED on 05/06 at [facility], patient advised that he would call back later as he was upset right now and could not talk.

## 2018-08-06 ENCOUNTER — Encounter: Payer: Self-pay | Admitting: Cardiology

## 2018-08-06 NOTE — Progress Notes (Signed)
Remote ICD transmission.   

## 2018-08-26 ENCOUNTER — Ambulatory Visit (INDEPENDENT_AMBULATORY_CARE_PROVIDER_SITE_OTHER): Payer: Medicare Other

## 2018-08-26 DIAGNOSIS — I5022 Chronic systolic (congestive) heart failure: Secondary | ICD-10-CM

## 2018-08-26 DIAGNOSIS — Z95810 Presence of automatic (implantable) cardiac defibrillator: Secondary | ICD-10-CM

## 2018-08-27 NOTE — Progress Notes (Signed)
EPIC Encounter for ICM Monitoring  Patient Name: Rickey Rice is a 78 y.o. male Date: 08/27/2018 Primary Care Physican: Lucianne Lei, MD Primary Cardiologist:[name] Electrophysiologist: Vergie Living Pacing: >99% 06/19/2018 Weight:312lbs 07/23/2018 Weight: 311 lbs   Heart failure questions reviewed.He is asymptomatic.   Corvue Thoracic impedance normal.   Prescribed:Furosemide 40 mg 1 tablet daily.  Labs: 09/19/2017 Creatinine 1.20, BUN 18, Potassium 4.2, Sodium 147, GFR 58-67 07/18/2016 Creatinine 1.09, BUN 17, Potassium 4.2, Sodium 145, GFR 76  Recommendations: No changes. Encouraged to call for fluid symptoms.  Follow-up plan: ICM clinic phone appointment on7/08/2018.   Copy of ICM check sent to [place].  3 month ICM trend: 08/26/2018    1 Year ICM trend:       Rosalene Billings, RN 08/27/2018 8:31 AM

## 2018-09-12 ENCOUNTER — Telehealth: Payer: Self-pay

## 2018-09-12 NOTE — Telephone Encounter (Signed)
I called and left patient a message about switching office visit on 09/24/18 to a telehealth visit.

## 2018-09-17 NOTE — Telephone Encounter (Signed)

## 2018-09-24 ENCOUNTER — Telehealth: Payer: Medicare Other | Admitting: Internal Medicine

## 2018-09-26 ENCOUNTER — Other Ambulatory Visit: Payer: Self-pay

## 2018-09-26 ENCOUNTER — Encounter: Payer: Self-pay | Admitting: Internal Medicine

## 2018-09-26 ENCOUNTER — Telehealth (INDEPENDENT_AMBULATORY_CARE_PROVIDER_SITE_OTHER): Payer: Medicare Other | Admitting: Internal Medicine

## 2018-09-26 VITALS — BP 124/72 | HR 63 | Ht 71.0 in | Wt 314.0 lb

## 2018-09-26 DIAGNOSIS — I5022 Chronic systolic (congestive) heart failure: Secondary | ICD-10-CM | POA: Diagnosis not present

## 2018-09-26 DIAGNOSIS — Z95810 Presence of automatic (implantable) cardiac defibrillator: Secondary | ICD-10-CM

## 2018-09-26 DIAGNOSIS — I428 Other cardiomyopathies: Secondary | ICD-10-CM

## 2018-09-26 DIAGNOSIS — I472 Ventricular tachycardia: Secondary | ICD-10-CM | POA: Diagnosis not present

## 2018-09-26 DIAGNOSIS — I4729 Other ventricular tachycardia: Secondary | ICD-10-CM

## 2018-09-26 NOTE — Progress Notes (Signed)
Electrophysiology TeleHealth Note   Due to national recommendations of social distancing due to COVID 19, an audio/video telehealth visit is felt to be most appropriate for this patient at this time.  See MyChart message from today for the patient's consent to telehealth for [facility].   Date:  09/26/2018   ID:  Rickey Rice, DOB 05-21-1940, MRN [id number]  Location: patient's home  Provider location: [address] [place]  Evaluation Performed: Follow-up visit  PCP:  Lucianne Lei, MD  Cardiologist:   [facility] Electrophysiologist:  [facility]   Chief Complaint:  chf   History of Present Illness:    Rickey Rice is a 78 y.o. male who presents via audio/video conferencing for a telehealth visit today. The patient did not have access to video technology/had technical difficulties with video requiring transitioning to audio format only (telephone).  All issues noted in this document were discussed and addressed.  No physical exam could be performed with this format.     Since last being seen in our clinic, the patient reports doing about the same No chest pain edema or nocturnal dyspnea  Dyspnea and weakness with walking, orthopedic issues are limiting  SDB, and had sleep study at [facility] 7/19  >> CPAP titration had been recommended but never consummated  Date Cr K Mg  11/17  1.24 4.2  1.9    5/20 1.13 3.8     DATE TEST EF   3/16 LHC  No obstructive CAD  3/16 Echo 40 %   8/17 Echo 30-35 %   2/20 Echo 25-30 % RVE and mod dysfn RAE but normal LA     The patient denies symptoms of fevers, chills, cough, or new SOB worrisome for COVID 19. *   Past Medical History:  Diagnosis Date  . AICD (automatic cardioverter/defibrillator) present   . Arthritis    oa  . Cardiac arrest ([place]) 06/07/2014   Primary VF arrest with successful resuscitation and s/p ICD implant  . Diabetes mellitus without complication ([place])    type 2  . Dyslipidemia   . History of  nuclear stress test 04/2012   lexiscan - 2 day protocol; low risk study, evidence of inferrior & apical scar but no ischemia   . Hypertension   . Hypothyroidism   . LBBB (left bundle branch block)   . Morbid obesity ([place])   . NICM (nonischemic cardiomyopathy) ([place])   . Prostate enlargement    pt denies    Past Surgical History:  Procedure Laterality Date  . BI-VENTRICULAR IMPLANTABLE CARDIOVERTER DEFIBRILLATOR N/A 06/18/2014   STJ CRTD implanted by Dr [name]  . COLONOSCOPY W/ BIOPSIES    . COLONOSCOPY WITH PROPOFOL N/A 04/05/2017   Procedure: COLONOSCOPY WITH PROPOFOL;  Surgeon: Juanita Craver, MD;  Location: [facility];  Service: Endoscopy;  Laterality: N/A;  . [place]   1 st joint  right hand amputated  . KNEE ARTHROSCOPY Left   . LEFT HEART CATHETERIZATION WITH CORONARY ANGIOGRAM N/A 06/12/2014   Procedure: LEFT HEART CATHETERIZATION WITH CORONARY ANGIOGRAM;  Surgeon: Leonie Man, MD;  Location: [facility] CATH LAB;  Service: Cardiovascular;  Laterality: N/A;  . Lead  . TOTAL KNEE ARTHROPLASTY Right 02/05/2013   Procedure: TOTAL KNEE ARTHROPLASTY;  Surgeon: Kerin Salen, MD;  Location: [place];  Service: Orthopedics;  Laterality: Right;  . TOTAL KNEE ARTHROPLASTY Left 01/19/2014   Procedure: LEFT TOTAL KNEE ARTHROPLASTY;  Surgeon: Kerin Salen, MD;  Location: [place];  Service: Orthopedics;  Laterality: Left;    Current Outpatient Medications  Medication Sig Dispense Refill  . acetaminophen (TYLENOL) 500 MG tablet Take 500 mg by mouth daily as needed for moderate pain or headache.    Marland Kitchen aspirin EC 81 MG tablet Take 81 mg by mouth daily.    . carboxymethylcellulose (REFRESH PLUS) 0.5 % SOLN Place 1 drop into both eyes daily as needed (dry eyes).    . carvedilol (COREG) 12.5 MG tablet TAKE 1 TABLET BY MOUTH TWICE DAILY WITH A MEAL. 180 tablet 0  . furosemide (LASIX) 40 MG tablet TAKE 1 TABLET BY MOUTH EVERY DAY 90 tablet 3  . glimepiride (AMARYL) 4 MG tablet Take  8 mg by mouth daily before breakfast.     . glucose blood (ONE TOUCH ULTRA TEST) test strip USE AS DIRECTED TO CHECK BLOOD SUGAR BID    . latanoprost (XALATAN) 0.005 % ophthalmic solution PLACE 1 GTT INTO THE LEFT EYE ONLY D IN THE EVENING    . levothyroxine (SYNTHROID, LEVOTHROID) 75 MCG tablet Take 75 mcg by mouth every evening. Monday through Saturday    . meclizine (ANTIVERT) 25 MG tablet Take 1 tablet (25 mg total) by mouth 2 (two) times daily as needed for dizziness. 8 tablet 0  . Multiple Vitamins-Minerals (ICAPS AREDS 2) CAPS Take 1 capsule by mouth 2 (two) times daily.    . Omega-3 Fatty Acids (OMEGA 3 PO) Take 1,600 mg by mouth every other day.    . rosuvastatin (CRESTOR) 10 MG tablet Take 1 tablet (10 mg total) by mouth daily at 6 PM. 30 tablet 0  . sacubitril-valsartan (ENTRESTO) 49-51 MG Take 1 tablet by mouth 2 (two) times daily. 60 tablet 11  . saxagliptin HCl (ONGLYZA) 5 MG TABS tablet Take 5 mg by mouth daily.    . TURMERIC CURCUMIN PO Take 1 tablet by mouth every other day.     No current facility-administered medications for this visit.     Allergies:   Bee venom, Demerol [meperidine], Pioglitazone, Shrimp [shellfish allergy], and Sulfa antibiotics   Social History:  The patient  reports that he has never smoked. He has never used smokeless tobacco. He reports current alcohol use. He reports that he does not use drugs.   Family History:  The patient's   family history includes Cancer in his mother; Hypertension in his sister; Kidney disease in his brother.   ROS:  Please see the history of present illness.   All other systems are personally reviewed and negative.    Exam:    Vital Signs:  BP 124/72   Pulse 63   Ht 5\' 11"  (1.803 m)   Wt (!) 314 lb (142.4 kg)   BMI 43.79 kg/m       Labs/Other Tests and Data Reviewed:    Recent Labs: 07/31/2018: ALT 18; BUN 13; Creatinine, Ser 1.13; Hemoglobin 12.0; Platelets 134; Potassium 3.8; Sodium 144  Personally reviewed     Wt Readings from Last 3 Encounters:  09/26/18 (!) 314 lb (142.4 kg)  07/31/18 (!) 313 lb (142 kg)  12/28/17 (!) 313 lb 9.6 oz (142.2 kg)     Other studies personally reviewed: Additional studies/ records that were reviewed today include: As above  Last device remote is reviewed from [place] PDF dated 5/20 which reveals normal device function,   arrhythmias - none    ASSESSMENT & PLAN:   NICM  Aborted cardiac Arrest  VT-polymorphic /syncope   CRT-D implant  Hypertension  CHF chronic-systolic   Depression  Sleep disordered breathing  RV dysfunction new   Will ask Dr [facility] to review aldactone-- saw on [facility] 3/16 and then disappeared but I dont see a reason  We will also reach out to Dr. Debara Pickett regarding reviewing the right sided chambers changes compared to 2017.  PA pressures have been in the 30s;on echo both times, no TR and no doppler evidence of L>>R shunting   Reviewed his sleep study; unfortunately was not able to interpret the raw data.  I have asked Dr. Eugene Garnet to help me.  Spoke with importance of exercise and weight loss.   COVID 19 screen The patient denies symptoms of COVID 19 at this time.  The importance of social distancing was discussed today.  Follow-up:  86m Next remote: As Scheduled   Current medicines are reviewed at length with the patient today.   The patient does not have concerns regarding his medicines.  The following changes were made today:  none  Labs/ tests ordered today include:   No orders of the defined types were placed in this encounter.   Future tests ( post COVID )     Patient Risk:  after full review of this patients clinical status, I feel that they are at moderate risk at this time.  Today, I have spent  25 minutes with the patient with telehealth technology discussing the above.  More than 50% of 40 min was spent in counseling related to the above   Signed, Virl Axe, MD  09/26/2018 4:28 PM     [facility] [address] [place] [address] [phone] (office) [phone] (fax)

## 2018-09-30 ENCOUNTER — Telehealth: Payer: Self-pay | Admitting: *Deleted

## 2018-09-30 DIAGNOSIS — G4733 Obstructive sleep apnea (adult) (pediatric): Secondary | ICD-10-CM

## 2018-09-30 NOTE — Telephone Encounter (Signed)
cpap titration sent to precert.

## 2018-09-30 NOTE — Telephone Encounter (Signed)
-----   Message from Sueanne Margarita, MD sent at 09/30/2018  8:37 AM EDT ----- Rickey Rice,  The patient was notified of his results las summer and we tried to set up for CPAP titration but he wanted to wait until after his vacation to set up and never followed up.  Gae Bon, please call patient to set up PAP titration in lab  Traci ----- Message ----- From: Deboraha Sprang, MD Sent: 09/26/2018   5:25 PM EDT To: Sueanne Margarita, MD  Could you please look at this sleep study and tell me if another study is indicated  I am struggling to interpret the report :((   That's whyat they call job security :))  Thanks steve

## 2018-10-01 ENCOUNTER — Telehealth: Payer: Self-pay | Admitting: *Deleted

## 2018-10-01 NOTE — Telephone Encounter (Signed)
-----   Message from Freada Bergeron, [place] sent at 09/30/2018  6:15 PM EDT ----- Regarding: Precert CPAP titration

## 2018-10-01 NOTE — Telephone Encounter (Signed)
Staff message sent to Gae Bon per [facility] web portal no PA required for CPAP titration. Ok to schedule. [employer] decision ID: [id number].

## 2018-10-04 ENCOUNTER — Other Ambulatory Visit (HOSPITAL_BASED_OUTPATIENT_CLINIC_OR_DEPARTMENT_OTHER): Payer: Self-pay

## 2018-10-04 NOTE — Addendum Note (Signed)
Addended by: Freada Bergeron on: 10/04/2018 02:57 PM   Modules accepted: Orders

## 2018-10-07 ENCOUNTER — Other Ambulatory Visit (HOSPITAL_COMMUNITY): Payer: Medicare Other

## 2018-10-07 ENCOUNTER — Telehealth: Payer: Self-pay | Admitting: *Deleted

## 2018-10-07 NOTE — Telephone Encounter (Signed)
Patient is scheduled for CPAP Titration on 7/30. PT. is scheduled for COVID screening on 7/27 prior to titration.  Please place the orders. Pt. understands his titration study will be done at [facility] sleep lab. Patient understands he will receive a letter in a week or so detailing appointment, date, time, and location. Patient understands to call if he does not receive the letter  in a timely manner. Patient agrees with treatment and thanked me for call.

## 2018-10-07 NOTE — Telephone Encounter (Signed)
-----   Message from Lauralee Evener, [place] sent at 10/01/2018 10:45 AM EDT ----- Regarding: RE: Precert Per [employer] web portal no PA is required. Ok to schedule CPAP titration. ----- Message ----- From: Freada Bergeron, CMA Sent: 09/30/2018   6:15 PM EDT To: Cv Div Sleep Studies Subject: Precert                                        CPAP titration

## 2018-10-08 ENCOUNTER — Ambulatory Visit (INDEPENDENT_AMBULATORY_CARE_PROVIDER_SITE_OTHER): Payer: Medicare Other

## 2018-10-08 DIAGNOSIS — Z95810 Presence of automatic (implantable) cardiac defibrillator: Secondary | ICD-10-CM | POA: Diagnosis not present

## 2018-10-08 DIAGNOSIS — I5022 Chronic systolic (congestive) heart failure: Secondary | ICD-10-CM

## 2018-10-10 ENCOUNTER — Ambulatory Visit: Payer: Medicare Other

## 2018-10-11 NOTE — Progress Notes (Signed)
EPIC Encounter for ICM Monitoring  Patient Name: Rickey Rice is a 78 y.o. male Date: 10/11/2018 Primary Care Physican: Lucianne Lei, MD Primary Cardiologist:[name] Electrophysiologist: Vergie Living Pacing: >99% 10/11/2018 Weight:313lbs  Heart failure questions reviewed.He is asymptomatic and feeling fine. He has been exercising.  Corvue Thoracic impedance normal.   Prescribed:Furosemide 40 mg 1 tablet daily.  Labs: 07/31/2018 Creatinine 1.13, BUN 13, Potassium 3.8, Sodium 144, GFR >60 A complete set of results can be found in Results Review.  Recommendations: No changes. Encouraged to call for fluid symptoms.  Follow-up plan: ICM clinic phone appointment on8/17/2020. Office appt scheduled 11/13/2018 with Dr Debara Pickett.  Copy of ICM check sent to [place].  3 month ICM trend: 10/08/2018    1 Year ICM trend:       Rosalene Billings, RN 10/11/2018 10:12 AM

## 2018-10-15 ENCOUNTER — Other Ambulatory Visit (HOSPITAL_COMMUNITY): Payer: Medicare Other

## 2018-10-16 ENCOUNTER — Other Ambulatory Visit (HOSPITAL_BASED_OUTPATIENT_CLINIC_OR_DEPARTMENT_OTHER): Payer: Self-pay

## 2018-10-17 ENCOUNTER — Other Ambulatory Visit: Payer: Self-pay | Admitting: Internal Medicine

## 2018-10-18 ENCOUNTER — Telehealth: Payer: Self-pay | Admitting: Internal Medicine

## 2018-10-18 ENCOUNTER — Encounter (HOSPITAL_BASED_OUTPATIENT_CLINIC_OR_DEPARTMENT_OTHER): Payer: Medicare Other

## 2018-10-18 MED ORDER — CARVEDILOL 12.5 MG PO TABS: ORAL_TABLET | ORAL | 3 refills | Status: DC

## 2018-10-18 NOTE — Telephone Encounter (Signed)
**Note De-Identified Rickey Rice Obfuscation** Carvedilol RX sent to [facility] in [place] to fill as requested.

## 2018-10-18 NOTE — Telephone Encounter (Signed)
°*  STAT* If patient is at the pharmacy, call can be transferred to refill team.   1. Which medications need to be refilled? (please list name of each medication and dose if known) carvedilol (COREG) 12.5 MG tablet  2. Which pharmacy/location (including street and city if local pharmacy) is medication to be sent to? [id number] - [place], [place] [address]  3. Do they need a 30 day or 90 day supply? 90 day  Patient is out of medication

## 2018-10-21 ENCOUNTER — Other Ambulatory Visit: Payer: Self-pay

## 2018-10-21 ENCOUNTER — Other Ambulatory Visit (HOSPITAL_COMMUNITY)
Admission: RE | Admit: 2018-10-21 | Discharge: 2018-10-21 | Disposition: A | Discharge status: Home / Self Care | Payer: Medicare Other | Source: Ambulatory Visit | Attending: Cardiology | Admitting: Cardiology

## 2018-10-21 ENCOUNTER — Other Ambulatory Visit (HOSPITAL_COMMUNITY): Payer: Medicare Other

## 2018-10-21 DIAGNOSIS — Z20828 Contact with and (suspected) exposure to other viral communicable diseases: Secondary | ICD-10-CM | POA: Diagnosis present

## 2018-10-22 LAB — SARS CORONAVIRUS 2 (TAT 6-24 HRS): SARS Coronavirus 2: NEGATIVE

## 2018-10-24 ENCOUNTER — Ambulatory Visit (HOSPITAL_BASED_OUTPATIENT_CLINIC_OR_DEPARTMENT_OTHER): Payer: Medicare Other | Attending: Cardiology | Admitting: Cardiology

## 2018-10-24 ENCOUNTER — Other Ambulatory Visit: Payer: Self-pay

## 2018-10-24 VITALS — Ht 72.0 in | Wt 314.0 lb

## 2018-10-24 DIAGNOSIS — G4733 Obstructive sleep apnea (adult) (pediatric): Secondary | ICD-10-CM

## 2018-10-25 ENCOUNTER — Other Ambulatory Visit (HOSPITAL_BASED_OUTPATIENT_CLINIC_OR_DEPARTMENT_OTHER): Payer: Self-pay

## 2018-10-25 DIAGNOSIS — G4733 Obstructive sleep apnea (adult) (pediatric): Secondary | ICD-10-CM

## 2018-10-27 NOTE — Procedures (Signed)
     Patient Name: Rickey Rice, Dosanjh Date: 10/24/2018 Gender: Male D.O.B: 27-Mar-1941 Age (years): 35 Referring Provider: Fransico Him MD, ABSM Height (inches): 71 Interpreting Physician: Fransico Him MD, ABSM Weight (lbs): 314 RPSGT: Zadie Rhine BMI: 44 MRN: [id number] Neck Size: 17.50  CLINICAL INFORMATION The patient is referred for a CPAP titration to treat sleep apnea.  SLEEP STUDY TECHNIQUE As per the AASM Manual for the Scoring of Sleep and Associated Events v2.3 (April 2016) with a hypopnea requiring 4% desaturations.  The channels recorded and monitored were frontal, central and occipital EEG, electrooculogram (EOG), submentalis EMG (chin), nasal and oral airflow, thoracic and abdominal wall motion, anterior tibialis EMG, snore microphone, electrocardiogram, and pulse oximetry. Continuous positive airway pressure (CPAP) was initiated at the beginning of the study and titrated to treat sleep-disordered breathing.  MEDICATIONS Medications self-administered by patient taken the night of the study : N/A  TECHNICIAN COMMENTS Comments added by technician: ONE RESTROOM VISTED Comments added by scorer: N/A  RESPIRATORY PARAMETERS Optimal PAP Pressure (cm): 12  AHI at Optimal Pressure (/hr):0.0 Overall Minimal O2 (%):83.0  Supine % at Optimal Pressure (%):0 Minimal O2 at Optimal Pressure (%): 87.0   SLEEP ARCHITECTURE The study was initiated at 11:06:17 PM and ended at 5:00:16 AM.  Sleep onset time was 29.7 minutes and the sleep efficiency was 83.6%. The total sleep time was 296 minutes.  The patient spent 3.0% of the night in stage N1 sleep, 77.2% in stage N2 sleep, 0.0% in stage N3 and 19.8% in REM.Stage REM latency was 61.5 minutes  Wake after sleep onset was 28.3. Alpha intrusion was absent. Supine sleep was 28.54%.  CARDIAC DATA The 2 lead EKG demonstrated pacemaker generated. The mean heart rate was 52.9 beats per minute. Other EKG findings include: None.   LEG MOVEMENT DATA The total Periodic Limb Movements of Sleep (PLMS) were 0. The PLMS index was 0.0. A PLMS index of <15 is considered normal in adults.  IMPRESSIONS - The optimal PAP pressure was 12 cm of water. - Central sleep apnea was not noted during this titration (CAI = 0.0/h). - Moderate oxygen desaturations were observed during this titration (min O2 = 83.0%). - No snoring was audible during this study. - No cardiac abnormalities were observed during this study. - Clinically significant periodic limb movements were not noted during this study. Arousals associated with PLMs were rare.  DIAGNOSIS - Obstructive Sleep Apnea (327.23 [G47.33 ICD-10])  RECOMMENDATIONS - Trial of CPAP therapy on 12 cm H2O with a Large size Fisher&Paykel Full Face Mask Simplus mask and heated humidification. - Avoid alcohol, sedatives and other CNS depressants that may worsen sleep apnea and disrupt normal sleep architecture. - Sleep hygiene should be reviewed to assess factors that may improve sleep quality. - Weight management and regular exercise should be initiated or continued. - Return to Sleep Center for re-evaluation after 10 weeks of therapy  [Electronically signed] 10/27/2018 09:15 PM  Fransico Him MD, ABSM Diplomate, American Board of Sleep Medicine

## 2018-10-29 ENCOUNTER — Ambulatory Visit (INDEPENDENT_AMBULATORY_CARE_PROVIDER_SITE_OTHER): Payer: Medicare Other | Admitting: Urology

## 2018-10-29 ENCOUNTER — Telehealth: Payer: Self-pay | Admitting: *Deleted

## 2018-10-29 DIAGNOSIS — N401 Enlarged prostate with lower urinary tract symptoms: Secondary | ICD-10-CM

## 2018-10-29 DIAGNOSIS — G4733 Obstructive sleep apnea (adult) (pediatric): Secondary | ICD-10-CM

## 2018-10-29 DIAGNOSIS — N400 Enlarged prostate without lower urinary tract symptoms: Secondary | ICD-10-CM | POA: Diagnosis not present

## 2018-10-29 NOTE — Telephone Encounter (Signed)
-----   Message from Sueanne Margarita, MD sent at 10/27/2018  9:24 PM EDT ----- Please let patient know that they had a successful PAP titration and let DME know that orders are in EPIC.  Please set up 10 week OV with me.

## 2018-10-29 NOTE — Telephone Encounter (Signed)
Informed patient of sleep study results and patient understanding was verbalized. Patient understands his sleep study showed they had a successful PAP titration and let DME know that orders are in EPIC. Please set up 10 week OV with me.   Upon patient request DME selection is [facility]. Patient understands HE will be contacted by [facility] to set up HIS cpap. Patient understands to call if [facility] does not contact HIM with new setup in a timely manner. Patient understands they will be called once confirmation has been received from [facility] that they have received their new machine to schedule 10 week follow up appointment.  [facility] notified of new cpap order  Please add to airview Patient was grateful for the call and thanked me.

## 2018-10-31 ENCOUNTER — Ambulatory Visit (INDEPENDENT_AMBULATORY_CARE_PROVIDER_SITE_OTHER): Payer: Medicare Other | Admitting: *Deleted

## 2018-10-31 DIAGNOSIS — I428 Other cardiomyopathies: Secondary | ICD-10-CM

## 2018-10-31 LAB — CUP PACEART REMOTE DEVICE CHECK
Battery Remaining Longevity: 45 mo
Battery Remaining Percentage: 47 %
Battery Voltage: 2.96 V
Brady Statistic AP VP Percent: 1 %
Brady Statistic AP VS Percent: 1 %
Brady Statistic AS VP Percent: 99 %
Brady Statistic AS VS Percent: 1 %
Brady Statistic RA Percent Paced: 1 %
Brady Statistic RV Percent Paced: 99 %
Date Time Interrogation Session: 20200806093806
HighPow Impedance: 64 Ohm
HighPow Impedance: 64 Ohm
Implantable Lead Implant Date: 20160324
Implantable Lead Implant Date: 20160324
Implantable Lead Implant Date: 20160324
Implantable Lead Location: 753858
Implantable Lead Location: 753859
Implantable Lead Location: 753860
Implantable Lead Model: 7122
Implantable Pulse Generator Implant Date: 20160324
Lead Channel Impedance Value: 400 Ohm
Lead Channel Impedance Value: 440 Ohm
Lead Channel Impedance Value: 510 Ohm
Lead Channel Sensing Intrinsic Amplitude: 12 mV
Lead Channel Sensing Intrinsic Amplitude: 3 mV
Lead Channel Setting Pacing Amplitude: 1 V
Lead Channel Setting Pacing Amplitude: 2 V
Lead Channel Setting Pacing Amplitude: 2.5 V
Lead Channel Setting Pacing Pulse Width: 0.5 ms
Lead Channel Setting Pacing Pulse Width: 0.5 ms
Lead Channel Setting Sensing Sensitivity: 0.5 mV
Pulse Gen Serial Number: 7240350

## 2018-11-05 ENCOUNTER — Encounter: Payer: Self-pay | Admitting: Cardiology

## 2018-11-05 NOTE — Progress Notes (Signed)
Remote ICD transmission.   

## 2018-11-11 ENCOUNTER — Ambulatory Visit (INDEPENDENT_AMBULATORY_CARE_PROVIDER_SITE_OTHER): Payer: Medicare Other

## 2018-11-11 DIAGNOSIS — Z95810 Presence of automatic (implantable) cardiac defibrillator: Secondary | ICD-10-CM | POA: Diagnosis not present

## 2018-11-11 DIAGNOSIS — I5022 Chronic systolic (congestive) heart failure: Secondary | ICD-10-CM

## 2018-11-11 NOTE — Progress Notes (Signed)
EPIC Encounter for ICM Monitoring  Patient Name: Rickey Rice is a 78 y.o. male Date: 11/11/2018 Primary Care Physican: Lucianne Lei, MD Primary Cardiologist:[name] Electrophysiologist: Vergie Living Pacing: >99% 10/11/2018 Weight:313lbs  Heart failure questions reviewed.He is asymptomatic.  He denies any fluid symptoms.   CorvueThoracic impedance normal.   Prescribed:Furosemide 40 mg 1 tablet daily.  Labs: 07/31/2018 Creatinine 1.13, BUN 13, Potassium 3.8, Sodium 144, GFR >60 A complete set of results can be found in Results Review.  Recommendations: No changes. Encouraged to call for fluid symptoms.  Follow-up plan: ICM clinic phone appointment on10/07/2018. Office appt scheduled 11/13/2018 with Dr Debara Pickett.  Copy of ICM check sent to [place]   3 month ICM trend: 11/11/2018    1 Year ICM trend:       Rosalene Billings, RN 11/11/2018 2:33 PM

## 2018-11-13 ENCOUNTER — Ambulatory Visit (INDEPENDENT_AMBULATORY_CARE_PROVIDER_SITE_OTHER): Payer: Medicare Other | Admitting: Internal Medicine

## 2018-11-13 ENCOUNTER — Encounter: Payer: Self-pay | Admitting: Internal Medicine

## 2018-11-13 ENCOUNTER — Other Ambulatory Visit: Payer: Self-pay

## 2018-11-13 VITALS — BP 142/82 | HR 64 | Temp 97.5°F | Ht 71.0 in | Wt 319.8 lb

## 2018-11-13 DIAGNOSIS — I5022 Chronic systolic (congestive) heart failure: Secondary | ICD-10-CM | POA: Diagnosis not present

## 2018-11-13 DIAGNOSIS — G4733 Obstructive sleep apnea (adult) (pediatric): Secondary | ICD-10-CM

## 2018-11-13 DIAGNOSIS — I428 Other cardiomyopathies: Secondary | ICD-10-CM | POA: Diagnosis not present

## 2018-11-13 DIAGNOSIS — Z95810 Presence of automatic (implantable) cardiac defibrillator: Secondary | ICD-10-CM | POA: Diagnosis not present

## 2018-11-13 MED ORDER — SPIRONOLACTONE 25 MG PO TABS: 12.5000 mg | ORAL_TABLET | Freq: Every day | ORAL | 3 refills | Status: DC

## 2018-11-13 MED ORDER — SACUBITRIL-VALSARTAN 97-103 MG PO TABS: 1.0000 | ORAL_TABLET | Freq: Two times a day (BID) | ORAL | 11 refills | Status: DC

## 2018-11-13 NOTE — Patient Instructions (Addendum)
Medication Instructions:  INCREASE entresto to 97/103mg  twice daily START spironolactone 12.5mg  daily (half of 25mg  tablet) Continue other medications  If you need a refill on your cardiac medications before your next appointment, please call your pharmacy.   Lab work: BMET in 1-2 weeks  If you have labs (blood work) drawn today and your tests are completely normal, you will receive your results only by: Marland Kitchen MyChart Message (if you have MyChart) OR . A paper copy in the mail If you have any lab test that is abnormal or we need to change your treatment, we will call you to review the results.  Testing/Procedures: NONE  Follow-Up: At [facility], you and your health needs are our priority.  As part of our continuing mission to provide you with exceptional heart care, we have created designated Provider Care Teams.  These Care Teams include your primary Cardiologist (physician) and Advanced Practice Providers (APPs -  Physician Assistants and Nurse Practitioners) who all work together to provide you with the care you need, when you need it. You will need a follow up appointment in 3 months in the office. You may see Pixie Casino, MD or one of the following Advanced Practice Providers on your designated Care Team: [place], [place] . Fabian Sharp, PA-C  Any Other Special Instructions Will Be Listed Below (If Applicable).

## 2018-11-13 NOTE — Progress Notes (Addendum)
OFFICE NOTE  Chief Complaint:  Follow-up heart failure  Primary Care Physician: Lucianne Lei, MD  HPI:  Rickey Rice  is a 78 year old gentleman with a history of diabetes, hypertension, hypothyroidism, and morbid obesity. In 2010 he had complaints of chest pain and underwent stress testing with [facility] which was a 2-day nuclear stress test and was apparently negative. Recently he underwent a colonoscopy and a preoperative EKG was abnormal. I unfortunately do not have that EKG to review, but I did review the EKG from your office on April 09, 2012 which showed a borderline intraventricular conduction delay, a sinus rhythm at 66, and poor R-wave progression. Today in the office he has an abnormal EKG demonstrating a left bundle branch block with a QRS duration of 168 msec, heart rate of 78 in sinus. His only symptoms are shortness of breath with exertion. He underwent evaluation of his left bundle branch block in February 2014. This is a 2 day nuclear stress test which was negative for ischemia. There was a small inferior and apical defect which could represent scar versus artifact. EF was mildly reduced at 49%.    Rickey Rice returns today for followup. He underwent knee replacement surgery as we had authorized him to do. He is tolerated surgery without any complications. He reports that he is recovering fairly well and is more mobile than he had been in the past. He started to do some exercises at the [place] and also this will translate into weight loss. Blood pressure has been stable he denies any chest pain.  He had blood work in January from his primary care provider which showed a total cholesterol of 128, HDL 51, triglycerides 53 and LDL 65. His hemoglobin A1c was 6.2.  Rickey Rice is now contemplating lethargy. Unfortunately has not been able to walk enough to lose weight in fact has gained some weight. 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 He feels like it's time to retire work on his generalized health. I would tend to agree with this.  I saw Rickey Rice back in the office today. He recently followed up from the hospital after having a sudden cardiac arrest. Unfortunately he was revived and is now status post AICD. He was to have CRT-D therapy, however there was difficulty with the LV lead, therefore he only has a single ventricle pacing with defibrillator functions. He is tolerating this well and had a small episode with fever postoperatively, but this was thought to be due to possibly a UTI. There is no evidence of pacemaker site fluctuance or swelling. Overall he feels he is doing well. He started exercising is managed to lose some weight. He is less short of breath and is more active.  Rickey Rice returns today for follow-up. Overall he is doing exceedingly well. He's managed to lose about 10 pounds. He is now below 300 pounds. His shortness of breath is improved some and he is ambulating better on his knees. He's had no device problems including no firings. He denies any chest pain or worsening shortness of breath and has not had any presyncopal or syncopal episodes.  Rickey Rice returns today for follow-up. Unfortunately he's gained some of his weight back. He denies a chest pain or worsening shortness of breath. He does not have any swelling. His last EF was as mentioned 40% in March 2016. EKG shows left bundle branch block. He scheduled to see Dr. [name] back for device  interrogation next month.  11/12/2015  Rickey Rice was seen today in follow-up. Unfortunately he was recently seen for some weight gain and fatigue by Rosaria Ferries, PA-C. She felt that he might be getting some extra fluid and recommended a short trial of increased Lasix. Weight is actually been fairly stable now at 300-301. Around that time he was found to have a UTI was treated for that and his symptoms improved. He was also noted to have  problems with infection and underwent multiple rounds of antibiotics. This led to problems with diarrhea or digestive problems and ultimately he went on probiotics. Recently, though he was talking on the phone and had an episode of transient global amnesia shortly after his wife left the house. He called her back and she came home and noted that he may have had an episode with his defibrillator. This was subsequently confirmed to be VT and he underwent a shock for that. He is seen Dr. [name] who increased his carvedilol to 12-1/2 mg twice a day. Blood pressure is actually a little low today but he reports being asymptomatic with that.  02/15/2016  Rickey Rice returns today for follow-up. He reports feeling well and denies worsening shortness of breath or chest pain. HE has not had any presyncopal spells. He just saw EP who noted his defibrillator was working properly. We again discussed strategies for heart failure management, including possibly switching lisinopril over to [facility]. He did not seem interested in this. It is noted his bp is higher today and that may at least allow uptitration of his lisinopril.  05/29/2016  Rickey Rice was seen today in follow-up. Weight is down slightly although has not made a significant improvement. He needs to start to do more exercise but he has blamed the weather and his arthritis. Blood pressure appears to be well-controlled and he brought readings in the office today. Although I do feel that there still potentially some room to consider adding Entresto. We discussed the medication today and it would effectively replace his lisinopril. He understands he would need to wash off the lisinopril for 36-48 hours before starting Entresto. We can provide him with samples and help him secure preauthorization.  12/28/2017  Rickey Rice is seen today in follow-up heart failure.  He reports NYHA class II symptoms, noting some shortness of breath with moderate exertion.  He has  recently had some weight loss.  He seems to be tolerating Entresto 24/26.  According to Dr. Olin Pia notes his LV lead is now activated.  He does appear to be biventricular pacing today.  Labs from July showed total cholesterol 143, HDL 52, LDL 79 triglycerides 42, hemoglobin A1c 5.8.  BNP in November was 155.  Recent remote check showed no change in thoracic impedance suggesting that he is compensated with his heart failure.  We previously discussed increasing the dose of Entresto as blood pressure would tolerate.  He is interested in getting a flu shot today.  11/13/2018  Mr. Hensley is seen today back for follow-up of heart failure.  He continues to have NYHA class II symptoms.  He is struggling with weight.  Weight recently has gone up about 6 pounds.  He has been on the moderate dose of Entresto.  Unfortunately repeat echo earlier this year showed no significant improvement in LV function with his EF being 20 to 25%.  This is despite activation of his LV lead and biventricular pacing.  Remote check show stable thoracic impedance and no evidence of volume  overload.  Finally, he has been diagnosed with obstructive sleep apnea and will begin treatment shortly.  Hopefully this will be additionally helpful.  PMHx:  Past Medical History:  Diagnosis Date  . AICD (automatic cardioverter/defibrillator) present   . Arthritis    oa  . Cardiac arrest ([place]) 06/07/2014   Primary VF arrest with successful resuscitation and s/p ICD implant  . Diabetes mellitus without complication ([place])    type 2  . Dyslipidemia   . History of nuclear stress test 04/2012   lexiscan - 2 day protocol; low risk study, evidence of inferrior & apical scar but no ischemia   . Hypertension   . Hypothyroidism   . LBBB (left bundle branch block)   . Morbid obesity ([place])   . NICM (nonischemic cardiomyopathy) ([place])   . Prostate enlargement    pt denies    Past Surgical History:  Procedure Laterality Date  . BI-VENTRICULAR  IMPLANTABLE CARDIOVERTER DEFIBRILLATOR N/A 06/18/2014   STJ CRTD implanted by Dr [name]  . COLONOSCOPY W/ BIOPSIES    . COLONOSCOPY WITH PROPOFOL N/A 04/05/2017   Procedure: COLONOSCOPY WITH PROPOFOL;  Surgeon: Juanita Craver, MD;  Location: [facility] ENDOSCOPY;  Service: Endoscopy;  Laterality: N/A;  . [place]   1 st joint  right hand amputated  . KNEE ARTHROSCOPY Left   . LEFT HEART CATHETERIZATION WITH CORONARY ANGIOGRAM N/A 06/12/2014   Procedure: LEFT HEART CATHETERIZATION WITH CORONARY ANGIOGRAM;  Surgeon: Leonie Man, MD;  Location: [facility] CATH LAB;  Service: Cardiovascular;  Laterality: N/A;  . [place]  . TOTAL KNEE ARTHROPLASTY Right 02/05/2013   Procedure: TOTAL KNEE ARTHROPLASTY;  Surgeon: Kerin Salen, MD;  Location: [place];  Service: Orthopedics;  Laterality: Right;  . TOTAL KNEE ARTHROPLASTY Left 01/19/2014   Procedure: LEFT TOTAL KNEE ARTHROPLASTY;  Surgeon: Kerin Salen, MD;  Location: [place];  Service: Orthopedics;  Laterality: Left;    FAMHx:  Family History  Problem Relation Age of Onset  . Cancer Mother   . Kidney disease Brother   . Hypertension Sister     SOCHx:   reports that he has never smoked. He has never used smokeless tobacco. He reports current alcohol use. He reports that he does not use drugs.  ALLERGIES:  Allergies  Allergen Reactions  . Bee Venom Swelling    Swelling on lips and tongue  . Demerol [Meperidine]     Not sure  . Pioglitazone     Lips swelling= actos  . Shrimp [Shellfish Allergy]     rash  . Sulfa Antibiotics     Not sure    ROS: Pertinent items noted in HPI and remainder of comprehensive ROS otherwise negative.  HOME MEDS: Current Outpatient Medications  Medication Sig Dispense Refill  . acetaminophen (TYLENOL) 500 MG tablet Take 500 mg by mouth daily as needed for moderate pain or headache.    Marland Kitchen aspirin EC 81 MG tablet Take 81 mg by mouth daily.    . carboxymethylcellulose (REFRESH PLUS) 0.5 % SOLN Place 1  drop into both eyes daily as needed (dry eyes).    . carvedilol (COREG) 12.5 MG tablet TAKE 1 TABLET BY MOUTH TWICE DAILY WITH A MEAL 180 tablet 3  . furosemide (LASIX) 40 MG tablet TAKE 1 TABLET BY MOUTH EVERY DAY 90 tablet 3  . glimepiride (AMARYL) 4 MG tablet Take 8 mg by mouth daily before breakfast.     . glucose blood (ONE TOUCH ULTRA TEST) test  strip USE AS DIRECTED TO CHECK BLOOD SUGAR BID    . latanoprost (XALATAN) 0.005 % ophthalmic solution PLACE 1 GTT INTO THE LEFT EYE ONLY D IN THE EVENING    . levothyroxine (SYNTHROID, LEVOTHROID) 75 MCG tablet Take 75 mcg by mouth every evening. Monday through Saturday    . Multiple Vitamins-Minerals (ICAPS AREDS 2) CAPS Take 1 capsule by mouth 2 (two) times daily.    . Omega-3 Fatty Acids (OMEGA 3 PO) Take 1,600 mg by mouth every other day.    . rosuvastatin (CRESTOR) 10 MG tablet Take 1 tablet (10 mg total) by mouth daily at 6 PM. 30 tablet 0  . sacubitril-valsartan (ENTRESTO) 49-51 MG Take 1 tablet by mouth 2 (two) times daily. 60 tablet 11  . saxagliptin HCl (ONGLYZA) 5 MG TABS tablet Take 5 mg by mouth daily.    . TURMERIC CURCUMIN PO Take 1 tablet by mouth every other day.     No current facility-administered medications for this visit.     LABS/IMAGING: No results found for this or any previous visit (from the past 48 hour(s)). No results found.  VITALS: BP (!) 142/82   Pulse 64   Temp (!) 97.5 F (36.4 C) (Temporal)   Ht 5\' 11"  (1.803 m)   Wt (!) 319 lb 12.8 oz (145.1 kg)   SpO2 97%   BMI 44.60 kg/m   EXAM: General appearance: alert, no distress and morbidly obese Neck: no carotid bruit, no JVD and thyroid not enlarged, symmetric, no tenderness/mass/nodules Lungs: clear to auscultation bilaterally and AICD in left upper chest Heart: regular rate and rhythm, S1, S2 normal, no murmur, click, rub or gallop Abdomen: soft, non-tender; bowel sounds normal; no masses,  no organomegaly and Morbidly obese Extremities: extremities  normal, atraumatic, no cyanosis or edema Pulses: 2+ and symmetric Skin: Skin color, texture, turgor normal. No rashes or lesions  EKG: Bi V paced rhythm at 64-personally reviewed  ASSESSMENT: 1. Status post aborted sudden cardiac death-status post placement of a [employer] Bi-V AICD (LV lead is disabled due to dysfunction) 2. Recent VT/VF with appropriate AICD shock 3. Negative nuclear stress test for ischemia, fixed inferoapical defect suggesting possible scar versus artifact, EF 49% 4. Diabetes type 2 on oral medications - A1c 5.8 5. Hypertension-well controlled 6. Dyslipidemia on statin - well controlled 7. Ischemic cardiomyopathy-EF 20-25% 8. OSA - not currently on therapy  PLAN: 1.   Mr. Hackler has had no significant improvement in LV function on moderate dose Entresto and with by V pacing.  His weight is up about 6 pounds however appears euvolemic and his thoracic impedance has not changed significantly on the device.  Blood pressure would allow increases in his medicines today and I recommend going up to the highest Entresto dose.  We will also add Aldactone 12.5 mg daily.  He will need repeat metabolic profile in 1 to 2 weeks. He was tested for OSA in 2019, but is not currently on therapy. Will refer back for a split night study for titration.  Follow-up in 3 months  Pixie Casino, MD, [facility], [place] Director of the Advanced Lipid Disorders &  Cardiovascular Risk Reduction Clinic Diplomate of the American Board of Clinical Lipidology Attending Cardiologist  Direct Dial: [phone]  Fax: [phone]  Website:  [name] 11/13/2018, 2:49 PM

## 2018-12-03 ENCOUNTER — Telehealth: Payer: Self-pay | Admitting: Internal Medicine

## 2018-12-03 NOTE — Telephone Encounter (Signed)
  Patient is calling in regards to his CPAP mask. Please call back.

## 2018-12-04 LAB — BASIC METABOLIC PANEL
BUN/Creatinine Ratio: 13 (ref 10–24)
BUN: 18 mg/dL (ref 8–27)
CO2: 28 mmol/L (ref 20–29)
Calcium: 8.8 mg/dL (ref 8.6–10.2)
Chloride: 107 mmol/L — ABNORMAL HIGH (ref 96–106)
Creatinine, Ser: 1.34 mg/dL — ABNORMAL HIGH (ref 0.76–1.27)
GFR calc Af Amer: 58 mL/min/{1.73_m2} — ABNORMAL LOW (ref >59)
GFR calc non Af Amer: 50 mL/min/{1.73_m2} — ABNORMAL LOW (ref >59)
Glucose: 150 mg/dL — ABNORMAL HIGH (ref 65–99)
Potassium: 4.4 mmol/L (ref 3.5–5.2)
Sodium: 147 mmol/L — ABNORMAL HIGH (ref 134–144)

## 2018-12-04 NOTE — Telephone Encounter (Signed)
Per sharon at [facility] patients sleep study done on 10/07/17 is too old to use to get his cpap. Patient wanted to wait until after his vacation to set up and never followed up.  Split night ordered and sent to sleep pool.

## 2018-12-04 NOTE — Addendum Note (Signed)
Addended by: Freada Bergeron on: 12/04/2018 02:30 PM   Modules accepted: Orders

## 2018-12-04 NOTE — Telephone Encounter (Addendum)
Per sharon at [facility] patients sleep study done on 10/07/17 is too old to use to get his cpap. Patient wanted to wait until after his vacation to set up and never followed up.  Split night ordered and sent to sleep pool. Pt is aware and agreeable to treatment.

## 2018-12-05 ENCOUNTER — Telehealth: Payer: Self-pay | Admitting: *Deleted

## 2018-12-05 NOTE — Telephone Encounter (Signed)
-----   Message from Freada Bergeron, [place] sent at 12/04/2018  2:25 PM EDT ----- Regarding: PRECERT SPLIT NIGHT

## 2018-12-05 NOTE — Telephone Encounter (Signed)
Staff message sent to Gae Bon per [facility] web portal no PA is required. Ok to schedule. Decision [id number].

## 2018-12-10 ENCOUNTER — Other Ambulatory Visit (HOSPITAL_BASED_OUTPATIENT_CLINIC_OR_DEPARTMENT_OTHER): Payer: Self-pay

## 2018-12-10 NOTE — Addendum Note (Signed)
Addended by: Zannie Kehr on: 12/10/2018 10:51 AM   Modules accepted: Orders, SmartSet

## 2018-12-10 NOTE — Telephone Encounter (Signed)
RE: Cranston Neighbor, CMA  Freada Bergeron, CMA        Per [facility] web portal no PA is required. Ok to schedule. Decision [id number].     ----- Message -----  From: Freada Bergeron, CMA  Sent: 12/04/2018  2:25 PM EDT  To: [facility] Sleep Studies  Subject: PRECERT                      SPLIT NIGHT

## 2018-12-10 NOTE — Telephone Encounter (Signed)
Patient is scheduled for lab study on 12/19/18. Rickey Rice is scheduled for COVID screening on 12/16/18 9 am prior to titration at [facility] Patient understands his sleep study will be done at [facility] sleep lab. Patient understands he will receive a sleep packet in a week or so. Patient understands to call if he does not receive the sleep packet in a timely manner. Patient agrees with treatment and thanked me for call.

## 2018-12-16 ENCOUNTER — Other Ambulatory Visit (HOSPITAL_COMMUNITY)
Admission: RE | Admit: 2018-12-16 | Discharge: 2018-12-16 | Disposition: A | Discharge status: Home / Self Care | Payer: Medicare Other | Source: Ambulatory Visit | Attending: Cardiology | Admitting: Cardiology

## 2018-12-16 DIAGNOSIS — Z20828 Contact with and (suspected) exposure to other viral communicable diseases: Secondary | ICD-10-CM | POA: Diagnosis not present

## 2018-12-16 DIAGNOSIS — Z01812 Encounter for preprocedural laboratory examination: Secondary | ICD-10-CM | POA: Diagnosis present

## 2018-12-16 LAB — SARS CORONAVIRUS 2 (TAT 6-24 HRS): SARS Coronavirus 2: NEGATIVE

## 2018-12-17 ENCOUNTER — Telehealth: Payer: Self-pay

## 2018-12-17 NOTE — Telephone Encounter (Signed)
The patient has been notified of the result and verbalized understanding.  All questions (if any) were answered. Wilma Flavin, RN 12/17/2018 9:27 AM

## 2018-12-19 ENCOUNTER — Ambulatory Visit (HOSPITAL_BASED_OUTPATIENT_CLINIC_OR_DEPARTMENT_OTHER): Payer: Medicare Other | Attending: Cardiology | Admitting: Cardiology

## 2018-12-19 ENCOUNTER — Other Ambulatory Visit: Payer: Self-pay

## 2018-12-19 DIAGNOSIS — G4733 Obstructive sleep apnea (adult) (pediatric): Secondary | ICD-10-CM | POA: Insufficient documentation

## 2018-12-30 ENCOUNTER — Ambulatory Visit (INDEPENDENT_AMBULATORY_CARE_PROVIDER_SITE_OTHER): Payer: Medicare Other

## 2018-12-30 DIAGNOSIS — I5022 Chronic systolic (congestive) heart failure: Secondary | ICD-10-CM | POA: Diagnosis not present

## 2018-12-30 DIAGNOSIS — Z95810 Presence of automatic (implantable) cardiac defibrillator: Secondary | ICD-10-CM

## 2018-12-31 ENCOUNTER — Telehealth: Payer: Self-pay

## 2018-12-31 NOTE — Telephone Encounter (Signed)
Left message for patient to remind of missed remote transmission.  

## 2019-01-01 NOTE — Progress Notes (Signed)
EPIC Encounter for ICM Monitoring  Patient Name: Rickey Rice is a 78 y.o. male Date: 01/01/2019 Primary Care Physican: Lucianne Lei, MD Primary Cardiologist:[name] Electrophysiologist: Vergie Living Pacing: >99% 12/30/2018 Weight:313lbs  Heart failure questions reviewed.He is asymptomatic.  He denies any fluid symptoms.   CorvueThoracic impedance normal.   Prescribed:Furosemide 40 mg 1 tablet daily.  Labs: 12/03/2018 Creatinine 1.34, BUN 18, Potassium 4.4, Sodium 147, GFR 07/31/2018 Creatinine1.13, BUN13, Potassium3.8, Sodium144, GFR>60 A complete set of results can be found in Results Review.  Recommendations:  No changes and encouraged to call if experiencing any fluid symptoms.  Follow-up plan: ICM clinic phone appointment on 02/12/2019.   91 day device clinic remote transmission 01/30/2019.  Office appt 02/13/2019 with Dr. Debara Pickett.    Copy of ICM check sent to Dr. [name].   3 month ICM trend: 12/30/2018    1 Year ICM trend:       Rosalene Billings, RN 01/01/2019 3:13 PM

## 2019-01-05 NOTE — Procedures (Signed)
   Patient Name: Rickey Rice, Rickey Rice Date: 12/19/2018 Gender: Male D.O.B: [birth year] Age (years): 29 Referring Provider: Fransico Him MD, ABSM Height (inches): 71 Interpreting Physician: Fransico Him MD, ABSM Weight (lbs): 314 RPSGT: Jorge Ny BMI: 44 MRN: [id number] Neck Size: 17.50  CLINICAL INFORMATION Sleep Study Type: NPSG  Indication for sleep study: Congestive Heart Failure, Diabetes, Morbid Obesity  Epworth Sleepiness Score: 2  Most recent polysomnogram dated 10/07/2017 revealed an AHI of 34.1/h and RDI of 50.9/h. Most recent titration study dated 10/24/2018 was optimal at 12cm H2O with an AHI of 4.3/h.  SLEEP STUDY TECHNIQUE As per the AASM Manual for the Scoring of Sleep and Associated Events v2.3 (April 2016) with a hypopnea requiring 4% desaturations.  The channels recorded and monitored were frontal, central and occipital EEG, electrooculogram (EOG), submentalis EMG (chin), nasal and oral airflow, thoracic and abdominal wall motion, anterior tibialis EMG, snore microphone, electrocardiogram, and pulse oximetry.  MEDICATIONS Medications self-administered by patient taken the night of the study : N/A  SLEEP ARCHITECTURE The study was initiated at 10:23:25 PM and ended at 4:42:37 AM.  Sleep onset time was 70.4 minutes and the sleep efficiency was 38.4%. The total sleep time was 145.5 minutes.  Stage REM latency was 55.5 minutes.  The patient spent 18.2% of the night in stage N1 sleep, 70.4% in stage N2 sleep, 0.0% in stage N3 and 11.3% in REM.  Alpha intrusion was absent.  Supine sleep was 69.07%.  RESPIRATORY PARAMETERS The overall apnea/hypopnea index (AHI) was 59.4 per hour. There were 103 total apneas, including 98 obstructive, 5 central and 0 mixed apneas. There were 41 hypopneas and 14 RERAs.  The AHI during Stage REM sleep was 72.7 per hour.  AHI while supine was 63.3 per hour.  The mean oxygen saturation was 93.6%. The minimum SpO2 during  sleep was 73.0%.  moderate snoring was noted during this study.  CARDIAC DATA The 2 lead EKG demonstrated pacemaker generated. The mean heart rate was 50.7 beats per minute. Other EKG findings include: PVCs.  LEG MOVEMENT DATA The total PLMS were 0 with a resulting PLMS index of 0.0. Associated arousal with leg movement index was 0.0 .  IMPRESSIONS - Severe obstructive sleep apnea occurred during this study (AHI = 59.4/h). - No significant central sleep apnea occurred during this study (CAI = 2.1/h). - Moderate oxygen desaturation was noted during this study (Min O2 = 73.0%). - The patient snored with moderate snoring volume. - EKG findings include PVCs. - Clinically significant periodic limb movements did not occur during sleep. No significant associated arousals.  DIAGNOSIS - Obstructive Sleep Apnea (327.23 [G47.33 ICD-10]) - Nocturnal Hypoxemia (327.26 [G47.36 ICD-10])  RECOMMENDATIONS - Therapeutic CPAP titration to determine optimal pressure required to alleviate sleep disordered breathing. - Positional therapy avoiding supine position during sleep. - Avoid alcohol, sedatives and other CNS depressants that may worsen sleep apnea and disrupt normal sleep architecture. - Sleep hygiene should be reviewed to assess factors that may improve sleep quality. - Weight management and regular exercise should be initiated or continued if appropriate.  [Electronically signed] 01/05/2019 03:59 PM  Fransico Him MD, ABSM Diplomate, American Board of Sleep Medicine

## 2019-01-08 ENCOUNTER — Telehealth: Payer: Self-pay | Admitting: *Deleted

## 2019-01-08 NOTE — Telephone Encounter (Signed)
-----   Message from Sueanne Margarita, MD sent at 01/05/2019  4:02 PM EDT ----- Please let patient know that they have sleep apnea and recommend CPAP titration. Please set up titration in the sleep lab.

## 2019-01-08 NOTE — Telephone Encounter (Signed)
Informed patient of sleep study results and patient understanding was verbalized.  

## 2019-01-10 ENCOUNTER — Telehealth: Payer: Self-pay | Admitting: *Deleted

## 2019-01-10 NOTE — Telephone Encounter (Signed)
Staff message sent to Gae Bon per [facility] web portal no PA required for titration study. Ok to schedule. Decision [id number].

## 2019-01-10 NOTE — Telephone Encounter (Addendum)
Please let patient know that they have sleep apnea and recommend CPAP titration.   Patient already had a titration on 7/30. He only needed a baseline study he has completed that and is now ready to be set up with his cpap.    Per [facility] web portal no PA is required. Ok to schedule titration study. Decision [id number].

## 2019-01-13 NOTE — Telephone Encounter (Signed)
Upon patient request DME selection is  [facility]  Patient understands HE will be contacted by [facility] to set up his cpap. Patient understands to call if [facility] does not contact him with new setup in a timely manner. Patient understands they will be called once confirmation has been received from [facility] that they have received their new machine to schedule 10 week follow up appointment.  [facility] notified of new cpap order  Please add to airview Patient was grateful for the call and thanked me.

## 2019-01-13 NOTE — Telephone Encounter (Signed)
Rickey Margarita, MD  Freada Bergeron, CMA        Please order ResMed CPAP therapy on 12 cm H2O with a Large size [employer] Full Face Mask Simplus mask and heated humidification.

## 2019-01-17 ENCOUNTER — Other Ambulatory Visit: Payer: Self-pay | Admitting: Internal Medicine

## 2019-01-24 NOTE — Telephone Encounter (Signed)
Patient will have an insurance change in January 1 and wants to wait until then for set up with cpap. [employer] notified Bethanne Ginger) and will flag her calendar for a reminder.

## 2019-01-30 ENCOUNTER — Ambulatory Visit (INDEPENDENT_AMBULATORY_CARE_PROVIDER_SITE_OTHER): Payer: Medicare Other | Admitting: *Deleted

## 2019-01-30 DIAGNOSIS — I472 Ventricular tachycardia, unspecified: Secondary | ICD-10-CM

## 2019-01-30 DIAGNOSIS — I5022 Chronic systolic (congestive) heart failure: Secondary | ICD-10-CM

## 2019-02-01 LAB — CUP PACEART REMOTE DEVICE CHECK
Battery Remaining Longevity: 43 mo
Battery Remaining Percentage: 45 %
Battery Voltage: 2.96 V
Brady Statistic AP VP Percent: 1 %
Brady Statistic AP VS Percent: 1 %
Brady Statistic AS VP Percent: 99 %
Brady Statistic AS VS Percent: 1 %
Brady Statistic RA Percent Paced: 1 %
Date Time Interrogation Session: 20201107052822
HighPow Impedance: 62 Ohm
HighPow Impedance: 62 Ohm
Implantable Lead Implant Date: 20160324
Implantable Lead Implant Date: 20160324
Implantable Lead Implant Date: 20160324
Implantable Lead Location: 753858
Implantable Lead Location: 753859
Implantable Lead Location: 753860
Implantable Lead Model: 7122
Implantable Pulse Generator Implant Date: 20160324
Lead Channel Impedance Value: 390 Ohm
Lead Channel Impedance Value: 430 Ohm
Lead Channel Impedance Value: 510 Ohm
Lead Channel Pacing Threshold Amplitude: 0.75 V
Lead Channel Pacing Threshold Amplitude: 0.75 V
Lead Channel Pacing Threshold Amplitude: 1 V
Lead Channel Pacing Threshold Pulse Width: 0.5 ms
Lead Channel Pacing Threshold Pulse Width: 0.5 ms
Lead Channel Pacing Threshold Pulse Width: 0.5 ms
Lead Channel Sensing Intrinsic Amplitude: 12 mV
Lead Channel Sensing Intrinsic Amplitude: 2.6 mV
Lead Channel Setting Pacing Amplitude: 1 V
Lead Channel Setting Pacing Amplitude: 2 V
Lead Channel Setting Pacing Amplitude: 2.5 V
Lead Channel Setting Pacing Pulse Width: 0.5 ms
Lead Channel Setting Pacing Pulse Width: 0.5 ms
Lead Channel Setting Sensing Sensitivity: 0.5 mV
Pulse Gen Serial Number: 7240350

## 2019-02-07 NOTE — Progress Notes (Addendum)
Remote ICD transmission.   

## 2019-02-12 ENCOUNTER — Ambulatory Visit (INDEPENDENT_AMBULATORY_CARE_PROVIDER_SITE_OTHER): Payer: Medicare Other

## 2019-02-12 DIAGNOSIS — I5022 Chronic systolic (congestive) heart failure: Secondary | ICD-10-CM

## 2019-02-12 DIAGNOSIS — Z95810 Presence of automatic (implantable) cardiac defibrillator: Secondary | ICD-10-CM

## 2019-02-13 ENCOUNTER — Other Ambulatory Visit: Payer: Self-pay

## 2019-02-13 ENCOUNTER — Encounter: Payer: Self-pay | Admitting: Internal Medicine

## 2019-02-13 ENCOUNTER — Ambulatory Visit: Payer: Medicare Other | Admitting: Internal Medicine

## 2019-02-13 VITALS — BP 138/76 | HR 71 | Temp 97.0°F | Ht 71.5 in | Wt 314.0 lb

## 2019-02-13 DIAGNOSIS — I472 Ventricular tachycardia, unspecified: Secondary | ICD-10-CM

## 2019-02-13 DIAGNOSIS — I428 Other cardiomyopathies: Secondary | ICD-10-CM

## 2019-02-13 DIAGNOSIS — G4733 Obstructive sleep apnea (adult) (pediatric): Secondary | ICD-10-CM | POA: Diagnosis not present

## 2019-02-13 DIAGNOSIS — I5022 Chronic systolic (congestive) heart failure: Secondary | ICD-10-CM | POA: Diagnosis not present

## 2019-02-13 NOTE — Progress Notes (Signed)
OFFICE NOTE  Chief Complaint:  Follow-up heart failure  Primary Care Physician: Lucianne Lei, MD  HPI:  Rickey Rice  is a 78 year old gentleman with a history of diabetes, hypertension, hypothyroidism, and morbid obesity. In 2010 he had complaints of chest pain and underwent stress testing with [facility] which was a 2-day nuclear stress test and was apparently negative. Recently he underwent a colonoscopy and a preoperative EKG was abnormal. I unfortunately do not have that EKG to review, but I did review the EKG from your office on April 09, 2012 which showed a borderline intraventricular conduction delay, a sinus rhythm at 66, and poor R-wave progression. Today in the office he has an abnormal EKG demonstrating a left bundle branch block with a QRS duration of 168 msec, heart rate of 78 in sinus. His only symptoms are shortness of breath with exertion. He underwent evaluation of his left bundle branch block in February 2014. This is a 2 day nuclear stress test which was negative for ischemia. There was a small inferior and apical defect which could represent scar versus artifact. EF was mildly reduced at 49%.    Mr. Rickey Rice returns today for followup. He underwent knee replacement surgery as we had authorized him to do. He is tolerated surgery without any complications. He reports that he is recovering fairly well and is more mobile than he had been in the past. He started to do some exercises at the [place] and also this will translate into weight loss. Blood pressure has been stable he denies any chest pain.  He had blood work in January from his primary care provider which showed a total cholesterol of 128, HDL 51, triglycerides 53 and LDL 65. His hemoglobin A1c was 6.2.  Mr. Rickey Rice is now contemplating lethargy. Unfortunately has not been able to walk enough to lose weight in fact has gained some weight. 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 He feels like it's time to retire work on his generalized health. I would tend to agree with this.  I saw Mr. Rickey Rice back in the office today. He recently followed up from the hospital after having a sudden cardiac arrest. Unfortunately he was revived and is now status post AICD. He was to have CRT-D therapy, however there was difficulty with the LV lead, therefore he only has a single ventricle pacing with defibrillator functions. He is tolerating this well and had a small episode with fever postoperatively, but this was thought to be due to possibly a UTI. There is no evidence of pacemaker site fluctuance or swelling. Overall he feels he is doing well. He started exercising is managed to lose some weight. He is less short of breath and is more active.  Mr. Rickey Rice returns today for follow-up. Overall he is doing exceedingly well. He's managed to lose about 10 pounds. He is now below 300 pounds. His shortness of breath is improved some and he is ambulating better on his knees. He's had no device problems including no firings. He denies any chest pain or worsening shortness of breath and has not had any presyncopal or syncopal episodes.  Mr. Rickey Rice returns today for follow-up. Unfortunately he's gained some of his weight back. He denies a chest pain or worsening shortness of breath. He does not have any swelling. His last EF was as mentioned 40% in March 2016. EKG shows left bundle branch block. He scheduled to see Dr. [name] back for device  interrogation next month.  11/12/2015  Mr. Rickey Rice was seen today in follow-up. Unfortunately he was recently seen for some weight gain and fatigue by Rosaria Ferries, PA-C. She felt that he might be getting some extra fluid and recommended a short trial of increased Lasix. Weight is actually been fairly stable now at 300-301. Around that time he was found to have a UTI was treated for that and his symptoms improved. He was also noted to have  problems with infection and underwent multiple rounds of antibiotics. This led to problems with diarrhea or digestive problems and ultimately he went on probiotics. Recently, though he was talking on the phone and had an episode of transient global amnesia shortly after his wife left the house. He called her back and she came home and noted that he may have had an episode with his defibrillator. This was subsequently confirmed to be VT and he underwent a shock for that. He is seen Dr. [name] who increased his carvedilol to 12-1/2 mg twice a day. Blood pressure is actually a little low today but he reports being asymptomatic with that.  02/15/2016  Mr. Rickey Rice returns today for follow-up. He reports feeling well and denies worsening shortness of breath or chest pain. HE has not had any presyncopal spells. He just saw EP who noted his defibrillator was working properly. We again discussed strategies for heart failure management, including possibly switching lisinopril over to [facility]. He did not seem interested in this. It is noted his bp is higher today and that may at least allow uptitration of his lisinopril.  05/29/2016  Mr. Rickey Rice was seen today in follow-up. Weight is down slightly although has not made a significant improvement. He needs to start to do more exercise but he has blamed the weather and his arthritis. Blood pressure appears to be well-controlled and he brought readings in the office today. Although I do feel that there still potentially some room to consider adding Entresto. We discussed the medication today and it would effectively replace his lisinopril. He understands he would need to wash off the lisinopril for 36-48 hours before starting Entresto. We can provide him with samples and help him secure preauthorization.  12/28/2017  Mr. Rickey Rice is seen today in follow-up heart failure.  He reports NYHA class II symptoms, noting some shortness of breath with moderate exertion.  He has  recently had some weight loss.  He seems to be tolerating Entresto 24/26.  According to Dr. Olin Pia notes his LV lead is now activated.  He does appear to be biventricular pacing today.  Labs from July showed total cholesterol 143, HDL 52, LDL 79 triglycerides 42, hemoglobin A1c 5.8.  BNP in November was 155.  Recent remote check showed no change in thoracic impedance suggesting that he is compensated with his heart failure.  We previously discussed increasing the dose of Entresto as blood pressure would tolerate.  He is interested in getting a flu shot today.  11/13/2018  Mr. Hensley is seen today back for follow-up of heart failure.  He continues to have NYHA class II symptoms.  He is struggling with weight.  Weight recently has gone up about 6 pounds.  He has been on the moderate dose of Entresto.  Unfortunately repeat echo earlier this year showed no significant improvement in LV function with his EF being 20 to 25%.  This is despite activation of his LV lead and biventricular pacing.  Remote check show stable thoracic impedance and no evidence of volume  overload.  Finally, he has been diagnosed with obstructive sleep apnea and will begin treatment shortly.  Hopefully this will be additionally helpful.  02/13/2019  Mr. Frizell returns today for follow-up of his heart failure.  Overall he continues to have NYHA class II symptoms.  He is been able to do most activities without incident.  LVEF remains depressed.  There is been no evidence of volume overload recently on his biventricular pacemaker.  Remote checks are undergoing.  Weight is down a few pounds.  He is try to make some dietary changes.  I added Aldactone and he is now on highest dose Entresto.  Plan is to repeat an echo likely in a few months to see if there is been any interval improvement in LV function.  Also, he was diagnosed with obstructive sleep apnea but is not yet been fitted with a mask.  He said when he was treated with that during  the titration study, the next day he felt the best he is ever felt was able to do many activities and generally had a good energy level.  Hopefully based on his insurance plan he will be able to get equipment beginning in January.  PMHx:  Past Medical History:  Diagnosis Date  . AICD (automatic cardioverter/defibrillator) present   . Arthritis    oa  . Cardiac arrest ([place]) 06/07/2014   Primary VF arrest with successful resuscitation and s/p ICD implant  . Diabetes mellitus without complication ([place])    type 2  . Dyslipidemia   . History of nuclear stress test 04/2012   lexiscan - 2 day protocol; low risk study, evidence of inferrior & apical scar but no ischemia   . Hypertension   . Hypothyroidism   . LBBB (left bundle branch block)   . Morbid obesity ([place])   . NICM (nonischemic cardiomyopathy) ([place])   . Prostate enlargement    pt denies    Past Surgical History:  Procedure Laterality Date  . BI-VENTRICULAR IMPLANTABLE CARDIOVERTER DEFIBRILLATOR N/A 06/18/2014   STJ CRTD implanted by Dr [name]  . COLONOSCOPY W/ BIOPSIES    . COLONOSCOPY WITH PROPOFOL N/A 04/05/2017   Procedure: COLONOSCOPY WITH PROPOFOL;  Surgeon: Juanita Craver, MD;  Location: [facility];  Service: Endoscopy;  Laterality: N/A;  . [place]   1 st joint  right hand amputated  . KNEE ARTHROSCOPY Left   . LEFT HEART CATHETERIZATION WITH CORONARY ANGIOGRAM N/A 06/12/2014   Procedure: LEFT HEART CATHETERIZATION WITH CORONARY ANGIOGRAM;  Surgeon: Leonie Man, MD;  Location: [facility] CATH LAB;  Service: Cardiovascular;  Laterality: N/A;  . [place]  . TOTAL KNEE ARTHROPLASTY Right 02/05/2013   Procedure: TOTAL KNEE ARTHROPLASTY;  Surgeon: Kerin Salen, MD;  Location: [facility];  Service: Orthopedics;  Laterality: Right;  . TOTAL KNEE ARTHROPLASTY Left 01/19/2014   Procedure: LEFT TOTAL KNEE ARTHROPLASTY;  Surgeon: Kerin Salen, MD;  Location: [place];  Service: Orthopedics;  Laterality: Left;    FAMHx:   Family History  Problem Relation Age of Onset  . Cancer Mother   . Kidney disease Brother   . Hypertension Sister     SOCHx:   reports that he has never smoked. He has never used smokeless tobacco. He reports current alcohol use. He reports that he does not use drugs.  ALLERGIES:  Allergies  Allergen Reactions  . Bee Venom Swelling    Swelling on lips and tongue  . Demerol [Meperidine]  Not sure  . Pioglitazone     Lips swelling= actos  . Shrimp [Shellfish Allergy]     rash  . Sulfa Antibiotics     Not sure    ROS: Pertinent items noted in HPI and remainder of comprehensive ROS otherwise negative.  HOME MEDS: Current Outpatient Medications  Medication Sig Dispense Refill  . acetaminophen (TYLENOL) 500 MG tablet Take 500 mg by mouth daily as needed for moderate pain or headache.    Marland Kitchen aspirin EC 81 MG tablet Take 81 mg by mouth daily.    . carboxymethylcellulose (REFRESH PLUS) 0.5 % SOLN Place 1 drop into both eyes daily as needed (dry eyes).    . carvedilol (COREG) 12.5 MG tablet TAKE 1 TABLET BY MOUTH TWICE DAILY WITH A MEAL 180 tablet 3  . furosemide (LASIX) 40 MG tablet TAKE 1 TABLET BY MOUTH EVERY DAY 90 tablet 2  . glimepiride (AMARYL) 4 MG tablet Take 8 mg by mouth daily before breakfast.     . glucose blood (ONE TOUCH ULTRA TEST) test strip USE AS DIRECTED TO CHECK BLOOD SUGAR BID    . latanoprost (XALATAN) 0.005 % ophthalmic solution PLACE 1 GTT INTO THE LEFT EYE ONLY D IN THE EVENING    . levothyroxine (SYNTHROID, LEVOTHROID) 75 MCG tablet Take 75 mcg by mouth every evening. Monday through Saturday    . Multiple Vitamins-Minerals (ICAPS AREDS 2) CAPS Take 1 capsule by mouth 2 (two) times daily.    . Omega-3 Fatty Acids (OMEGA 3 PO) Take 1,600 mg by mouth every other day.    . rosuvastatin (CRESTOR) 10 MG tablet Take 1 tablet (10 mg total) by mouth daily at 6 PM. 30 tablet 0  . sacubitril-valsartan (ENTRESTO) 97-103 MG Take 1 tablet by mouth 2 (two) times daily.  60 tablet 11  . saxagliptin HCl (ONGLYZA) 5 MG TABS tablet Take 5 mg by mouth daily.    Marland Kitchen spironolactone (ALDACTONE) 25 MG tablet Take 0.5 tablets (12.5 mg total) by mouth daily. 45 tablet 3  . TURMERIC CURCUMIN PO Take 1 tablet by mouth every other day.     No current facility-administered medications for this visit.     LABS/IMAGING: No results found for this or any previous visit (from the past 48 hour(s)). No results found.  VITALS: BP 138/76   Pulse 71   Temp (!) 97 F (36.1 C)   Ht 5' 11.5" (1.816 m)   Wt (!) 314 lb (142.4 kg)   SpO2 99%   BMI 43.18 kg/m   EXAM: General appearance: alert, no distress and morbidly obese Neck: no carotid bruit, no JVD and thyroid not enlarged, symmetric, no tenderness/mass/nodules Lungs: clear to auscultation bilaterally and AICD in left upper chest Heart: regular rate and rhythm, S1, S2 normal, no murmur, click, rub or gallop Abdomen: soft, non-tender; bowel sounds normal; no masses,  no organomegaly and Morbidly obese Extremities: extremities normal, atraumatic, no cyanosis or edema Pulses: 2+ and symmetric Skin: Skin color, texture, turgor normal. No rashes or lesions  EKG: Biventricular pacing at 63-personally reviewed  ASSESSMENT: 1. Status post aborted sudden cardiac death-status post placement of a [employer] Bi-V AICD (LV lead is disabled due to dysfunction) 2. Recent VT/VF with appropriate AICD shock 3. Negative nuclear stress test for ischemia, fixed inferoapical defect suggesting possible scar versus artifact, EF 49% 4. Diabetes type 2 on oral medications - A1c 5.8 5. Hypertension-well controlled 6. Dyslipidemia on statin - well controlled 7. Non-ischemic cardiomyopathy-EF 20-25% 8.  OSA - not currently on therapy  PLAN: 1.   Mr. Chilson seems to be doing well and tolerating his current heart failure medicine regimen.  We will plan a repeat limited echo for LVEF in a few months.  Follow-up with me in 3 to 6  months.  Pixie Casino, MD, [facility], [place] Director of the Advanced Lipid Disorders &  Cardiovascular Risk Reduction Clinic Diplomate of the American Board of Clinical Lipidology Attending Cardiologist  Direct Dial: [phone]  Fax: [phone]  Website:  [email or web address] 02/13/2019, 10:27 AM

## 2019-02-13 NOTE — Patient Instructions (Signed)
Medication Instructions:  Your physician recommends that you continue on your current medications as directed. Please refer to the Current Medication list given to you today.  *If you need a refill on your cardiac medications before your next appointment, please call your pharmacy*  Lab Work: NONE If you have labs (blood work) drawn today and your tests are completely normal, you will receive your results only by: Marland Kitchen MyChart Message (if you have MyChart) OR . A paper copy in the mail If you have any lab test that is abnormal or we need to change your treatment, we will call you to review the results.  Testing/Procedures: Echo in February 2021  Follow-Up: At [facility], you and your health needs are our priority.  As part of our continuing mission to provide you with exceptional heart care, we have created designated Provider Care Teams.  These Care Teams include your primary Cardiologist (physician) and Advanced Practice Providers (APPs -  Physician Assistants and Nurse Practitioners) who all work together to provide you with the care you need, when you need it.  Your next appointment:   6 month(s)  The format for your next appointment:   In Person  Provider:   Dr. Lyman Bishop  Other Instructions

## 2019-02-14 NOTE — Progress Notes (Signed)
EPIC Encounter for ICM Monitoring  Patient Name: Rickey Rice is a 78 y.o. male Date: 02/14/2019 Primary Care Physican: Lucianne Lei, MD Primary Cardiologist:[name] Electrophysiologist: Vergie Living Pacing: >99% 02/14/2019 Weight:306lbs  Heart failure questions reviewed.He is asymptomatic and denies any fluid symptoms.   CorvueThoracic impedance normal.   Prescribed:Furosemide 40 mg 1 tablet daily.  Labs: 12/03/2018 Creatinine 1.34, BUN 18, Potassium 4.4, Sodium 147, GFR 07/31/2018 Creatinine1.13, BUN13, Potassium3.8, Sodium144, GFR>60 A complete set of results can be found in Results Review.  Recommendations: No changes and encouraged to call if experiencing any fluid symptoms.  Follow-up plan: ICM clinic phone appointment on 03/24/2019.   91 day device clinic remote transmission 05/01/2019.     Copy of ICM check sent to Dr. [name].   3 month ICM trend: 02/13/2019    1 Year ICM trend:       Rosalene Billings, RN 02/14/2019 4:42 PM

## 2019-03-24 ENCOUNTER — Ambulatory Visit (INDEPENDENT_AMBULATORY_CARE_PROVIDER_SITE_OTHER): Payer: Medicare Other

## 2019-03-24 DIAGNOSIS — Z95810 Presence of automatic (implantable) cardiac defibrillator: Secondary | ICD-10-CM

## 2019-03-24 DIAGNOSIS — I5022 Chronic systolic (congestive) heart failure: Secondary | ICD-10-CM | POA: Diagnosis not present

## 2019-03-25 ENCOUNTER — Telehealth: Payer: Self-pay

## 2019-03-25 NOTE — Telephone Encounter (Signed)
Remote ICM transmission received.  Attempted call to patient regarding ICM remote transmission and left detailed message per DPR.  Advised to return call for any fluid symptoms or questions. Next ICM remote transmission scheduled 05/02/2019.

## 2019-03-25 NOTE — Progress Notes (Signed)
EPIC Encounter for ICM Monitoring  Patient Name: Rickey Rice is a 78 y.o. male Date: 03/25/2019 Primary Care Physican: Lucianne Lei, MD Primary Cardiologist:[name] Electrophysiologist: Vergie Living Pacing: 98% 02/14/2019 Weight:306lbs  Attempted call to patient and unable to reach.  Left detailed message per DPR regarding transmission. Transmission reviewed.   CorvueThoracic impedance normal.   Prescribed:Furosemide 40 mg 1 tablet daily.  Labs: 12/03/2018 Creatinine 1.34, BUN 18, Potassium 4.4, Sodium 147, GFR 07/31/2018 Creatinine1.13, BUN13, Potassium3.8, Sodium144, GFR>60 A complete set of results can be found in Results Review.  Recommendations: Left voice mail with ICM number and encouraged to call if experiencing any fluid symptoms.  Follow-up plan: ICM clinic phone appointment on 05/02/2019.   91 day device clinic remote transmission 05/01/2019.     Copy of ICM check sent to Dr. [name].   3 month ICM trend: 03/25/2019    1 Year ICM trend:       Rosalene Billings, RN 03/25/2019 12:16 PM

## 2019-05-01 ENCOUNTER — Ambulatory Visit (INDEPENDENT_AMBULATORY_CARE_PROVIDER_SITE_OTHER): Payer: Medicare PPO | Admitting: *Deleted

## 2019-05-01 DIAGNOSIS — G4733 Obstructive sleep apnea (adult) (pediatric): Secondary | ICD-10-CM | POA: Diagnosis not present

## 2019-05-01 DIAGNOSIS — I472 Ventricular tachycardia, unspecified: Secondary | ICD-10-CM

## 2019-05-01 LAB — CUP PACEART REMOTE DEVICE CHECK
Battery Remaining Longevity: 40 mo
Battery Remaining Percentage: 42 %
Brady Statistic RA Percent Paced: 0 %
Brady Statistic RV Percent Paced: 98 %
Date Time Interrogation Session: 20210204052824
HighPow Impedance: 63 Ohm
Implantable Lead Implant Date: 20160323200000
Implantable Lead Implant Date: 20160323200000
Implantable Lead Implant Date: 20160323200000
Implantable Lead Location: 753858
Implantable Lead Location: 753859
Implantable Lead Location: 753860
Implantable Lead Model: 7122
Implantable Pulse Generator Implant Date: 20160323200000
Lead Channel Impedance Value: 380 Ohm
Lead Channel Impedance Value: 440 Ohm
Lead Channel Impedance Value: 510 Ohm
Lead Channel Setting Pacing Amplitude: 1 V
Lead Channel Setting Pacing Amplitude: 2 V
Lead Channel Setting Pacing Amplitude: 2.5 V
Lead Channel Setting Pacing Pulse Width: 0.5 ms
Lead Channel Setting Pacing Pulse Width: 0.5 ms
Lead Channel Setting Sensing Sensitivity: 0.5 mV
Pulse Gen Serial Number: 7240350

## 2019-05-01 NOTE — Progress Notes (Signed)
ICD Remote  

## 2019-05-02 ENCOUNTER — Ambulatory Visit (INDEPENDENT_AMBULATORY_CARE_PROVIDER_SITE_OTHER): Payer: Medicare PPO

## 2019-05-02 DIAGNOSIS — I5022 Chronic systolic (congestive) heart failure: Secondary | ICD-10-CM

## 2019-05-02 DIAGNOSIS — Z95810 Presence of automatic (implantable) cardiac defibrillator: Secondary | ICD-10-CM

## 2019-05-02 NOTE — Progress Notes (Signed)
EPIC Encounter for ICM Monitoring  Patient Name: Rickey Rice is a 79 y.o. male Date: 05/02/2019 Primary Care Physican: Lucianne Lei, MD Primary Cardiologist:[name] Electrophysiologist: Vergie Living Pacing: 98% 05/02/2019 Weight:316lbs  Spoke with patient and denies any fluid symptoms.  Completed first COVID vaccine.   CorvueThoracic impedance normal.   Prescribed:Furosemide 40 mg 1 tablet daily.  Labs: 12/03/2018 Creatinine 1.34, BUN 18, Potassium 4.4, Sodium 147, GFR 07/31/2018 Creatinine1.13, BUN13, Potassium3.8, Sodium144, GFR>60 A complete set of results can be found in Results Review.  Recommendations:No changes and encouraged to call if experiencing any fluid symptoms.  Follow-up plan: ICM clinic phone appointment on3/10/2019. 91 day device clinic remote transmission 07/31/2019.   Copy of ICM check sent to [place].   3 month ICM trend: 05/01/2019    1 Year ICM trend:       Rosalene Billings, RN 05/02/2019 11:38 AM

## 2019-05-13 ENCOUNTER — Telehealth: Payer: Self-pay | Admitting: *Deleted

## 2019-05-13 ENCOUNTER — Other Ambulatory Visit: Payer: Self-pay

## 2019-05-13 ENCOUNTER — Ambulatory Visit (HOSPITAL_COMMUNITY): Payer: Medicare PPO | Attending: Cardiovascular Disease

## 2019-05-13 DIAGNOSIS — I428 Other cardiomyopathies: Secondary | ICD-10-CM | POA: Diagnosis not present

## 2019-05-13 DIAGNOSIS — I5022 Chronic systolic (congestive) heart failure: Secondary | ICD-10-CM

## 2019-05-13 NOTE — Telephone Encounter (Addendum)
Patient has a 10 week follow up appointment scheduled for 06/20/19. Patient understands he needs to keep this appointment for insurance compliance. Patient was grateful for the call and thanked me.

## 2019-05-13 NOTE — Telephone Encounter (Signed)

## 2019-05-27 DIAGNOSIS — I10 Essential (primary) hypertension: Secondary | ICD-10-CM | POA: Diagnosis not present

## 2019-05-27 DIAGNOSIS — E1169 Type 2 diabetes mellitus with other specified complication: Secondary | ICD-10-CM | POA: Diagnosis not present

## 2019-05-27 DIAGNOSIS — E662 Morbid (severe) obesity with alveolar hypoventilation: Secondary | ICD-10-CM | POA: Diagnosis not present

## 2019-05-27 DIAGNOSIS — E039 Hypothyroidism, unspecified: Secondary | ICD-10-CM | POA: Diagnosis not present

## 2019-05-29 DIAGNOSIS — G4733 Obstructive sleep apnea (adult) (pediatric): Secondary | ICD-10-CM | POA: Diagnosis not present

## 2019-05-29 DIAGNOSIS — G43B0 Ophthalmoplegic migraine, not intractable: Secondary | ICD-10-CM | POA: Diagnosis not present

## 2019-06-02 ENCOUNTER — Ambulatory Visit (INDEPENDENT_AMBULATORY_CARE_PROVIDER_SITE_OTHER): Payer: Medicare PPO

## 2019-06-02 DIAGNOSIS — G4733 Obstructive sleep apnea (adult) (pediatric): Secondary | ICD-10-CM | POA: Diagnosis not present

## 2019-06-02 DIAGNOSIS — I5022 Chronic systolic (congestive) heart failure: Secondary | ICD-10-CM | POA: Diagnosis not present

## 2019-06-02 DIAGNOSIS — Z95810 Presence of automatic (implantable) cardiac defibrillator: Secondary | ICD-10-CM | POA: Diagnosis not present

## 2019-06-03 NOTE — Progress Notes (Addendum)
EPIC Encounter for ICM Monitoring  Patient Name: Rickey Rice is a 79 y.o. male Date: 06/03/2019 Primary Care Physican: Lucianne Lei, MD Primary Cardiologist:[name] Electrophysiologist: Vergie Living Pacing: 98% 06/03/2019 Weight:311lbs  Spoke with patient and denies any fluid symptoms. Received both COVID vaccines.  He is working on losing weight and getting healthy  CorvueThoracic impedance normal.   Prescribed:Furosemide 40 mg 1 tablet daily.  Labs: 12/03/2018 Creatinine 1.34, BUN 18, Potassium 4.4, Sodium 147, GFR 07/31/2018 Creatinine1.13, BUN13, Potassium3.8, Sodium144, GFR>60 A complete set of results can be found in Results Review.  Recommendations:No changes and encouraged to call if experiencing any fluid symptoms.  Follow-up plan: ICM clinic phone appointment on4/02/2020. 91 day device clinic remote transmission 07/31/2019.   Copy of ICM check sent to [place].  3 month ICM trend: 06/03/2019    1 Year ICM trend:       Rosalene Billings, RN 06/03/2019 10:55 AM

## 2019-06-04 DIAGNOSIS — H353131 Nonexudative age-related macular degeneration, bilateral, early dry stage: Secondary | ICD-10-CM | POA: Diagnosis not present

## 2019-06-04 DIAGNOSIS — H35033 Hypertensive retinopathy, bilateral: Secondary | ICD-10-CM | POA: Diagnosis not present

## 2019-06-04 DIAGNOSIS — H35373 Puckering of macula, bilateral: Secondary | ICD-10-CM | POA: Diagnosis not present

## 2019-06-04 DIAGNOSIS — G43B0 Ophthalmoplegic migraine, not intractable: Secondary | ICD-10-CM | POA: Diagnosis not present

## 2019-06-04 DIAGNOSIS — H04123 Dry eye syndrome of bilateral lacrimal glands: Secondary | ICD-10-CM | POA: Diagnosis not present

## 2019-06-04 DIAGNOSIS — H0102B Squamous blepharitis left eye, upper and lower eyelids: Secondary | ICD-10-CM | POA: Diagnosis not present

## 2019-06-04 DIAGNOSIS — Z79899 Other long term (current) drug therapy: Secondary | ICD-10-CM | POA: Diagnosis not present

## 2019-06-04 DIAGNOSIS — H401211 Low-tension glaucoma, right eye, mild stage: Secondary | ICD-10-CM | POA: Diagnosis not present

## 2019-06-04 DIAGNOSIS — H0102A Squamous blepharitis right eye, upper and lower eyelids: Secondary | ICD-10-CM | POA: Diagnosis not present

## 2019-06-10 DIAGNOSIS — E669 Obesity, unspecified: Secondary | ICD-10-CM | POA: Diagnosis not present

## 2019-06-10 DIAGNOSIS — I10 Essential (primary) hypertension: Secondary | ICD-10-CM | POA: Diagnosis not present

## 2019-06-19 NOTE — Progress Notes (Signed)
Virtual Visit via Video Note   This visit type was conducted due to national recommendations for restrictions regarding the COVID-19 Pandemic (e.g. social distancing) in an effort to limit this patient's exposure and mitigate transmission in our community.  Due to his co-morbid illnesses, this patient is at least at moderate risk for complications without adequate follow up.  This format is felt to be most appropriate for this patient at this time.  All issues noted in this document were discussed and addressed.  A limited physical exam was performed with this format.  Please refer to the patient's chart for his consent to telehealth for [facility].   Evaluation Performed:  Follow-up visit  This visit type was conducted due to national recommendations for restrictions regarding the COVID-19 Pandemic (e.g. social distancing).  This format is felt to be most appropriate for this patient at this time.  All issues noted in this document were discussed and addressed.  No physical exam was performed (except for noted visual exam findings with Video Visits).  Please refer to the patient's chart (MyChart message for video visits and phone note for telephone visits) for the patient's consent to telehealth for [facility].  Date:  06/20/2019   ID:  Rickey Rice, DOB 1940/09/19, MRN [id number]  Patient Location:  Home  Provider location:   [name]  PCP:  Lucianne Lei, MD  Cardiologist:  Pixie Casino, MD  Sleep Medicine:  Fransico Him, MD Electrophysiologist:  None   Chief Complaint:  OSA  History of Present Illness:    Rickey Rice is a 79 y.o. male who presents via audio/video conferencing for a telehealth visit today.    This is a 79yo male with a hx of NICM s/p AICD, LBBB, morbid obesity, DM who was referred by Dr. Debara Pickett for evaluation of Rickey Rice.  He initially had a sleep study in 2019 showing severe OSA with an AHI of 34.1/hr with nocturnal hypoxemia with O2 sats as low as  76%.  He never got his CPAP titration done until a year later and was titrated to 12cm H2O but unfortunately due to the fact that he put off getting the PAP titration for too long after his initial PSG insurance would not pay for his PAP machine unless he started over with another PSG which he underwent 12/19/2018 showing severe OSA with AHI 59/hr and nocturnal hypoxemia.  He was set up for  CPAP at 12cm H2O but due to insurance changes he wanted to wait until 03/28/2019 to be set up.    He is doing well with his CPAP device and thinks that he has gotten used to it.  He tolerates the full face mask and feels the pressure is adequate.  Since going on CPAP he feels rested in the am and has no significant daytime sleepiness.  He has some problems with dry mouth but denies any nasal dryness or nasal congestion.  He does not think that he snores.    The patient does not have symptoms concerning for COVID-19 infection (fever, chills, cough, or new shortness of breath).   Prior CV studies:   The following studies were reviewed today:  Sleep study x 2, CPAP titration, PAP compliance download  Past Medical History:  Diagnosis Date  . AICD (automatic cardioverter/defibrillator) present   . Arthritis    oa  . Cardiac arrest ([place]) 06/07/2014   Primary VF arrest with successful resuscitation and s/p ICD implant  . Diabetes mellitus without complication ([place])  type 2  . Dyslipidemia   . History of nuclear stress test 04/2012   lexiscan - 2 day protocol; low risk study, evidence of inferrior & apical scar but no ischemia   . Hypertension   . Hypothyroidism   . LBBB (left bundle branch block)   . Morbid obesity ([place])   . NICM (nonischemic cardiomyopathy) ([place])   . Prostate enlargement    pt denies   Past Surgical History:  Procedure Laterality Date  . BI-VENTRICULAR IMPLANTABLE CARDIOVERTER DEFIBRILLATOR N/A 06/18/2014   STJ CRTD implanted by Dr Rickey Rice  . COLONOSCOPY W/ BIOPSIES    . COLONOSCOPY WITH  PROPOFOL N/A 04/05/2017   Procedure: COLONOSCOPY WITH PROPOFOL;  Surgeon: Juanita Craver, MD;  Location: [facility] END[facility]COPY;  Service: Endoscopy;  Laterality: N/A;  . [place]   1 st joint  right hand amputated  . KNEE ARTHROSCOPY Left   . LEFT HEART CATHETERIZATION WITH CORONARY ANGIOGRAM N/A 06/12/2014   Procedure: LEFT HEART CATHETERIZATION WITH CORONARY ANGIOGRAM;  Surgeon: Leonie Man, MD;  Location: [facility] CATH LAB;  Service: Cardiovascular;  Laterality: N/A;  . [place]  . TOTAL KNEE ARTHROPLASTY Right 02/05/2013   Procedure: TOTAL KNEE ARTHROPLASTY;  Surgeon: Kerin Salen, MD;  Location: [place];  Service: Orthopedics;  Laterality: Right;  . TOTAL KNEE ARTHROPLASTY Left 01/19/2014   Procedure: LEFT TOTAL KNEE ARTHROPLASTY;  Surgeon: Kerin Salen, MD;  Location: [place];  Service: Orthopedics;  Laterality: Left;     Current Meds  Medication Sig  . acetaminophen (TYLENOL) 500 MG tablet Take 500 mg by mouth daily as needed for moderate pain or headache.  Marland Kitchen aspirin EC 81 MG tablet Take 81 mg by mouth daily.  . carboxymethylcellulose (REFRESH PLUS) 0.5 % SOLN Place 1 drop into both eyes daily as needed (dry eyes).  . carvedilol (COREG) 12.5 MG tablet TAKE 1 TABLET BY MOUTH TWICE DAILY WITH A MEAL  . furosemide (LASIX) 40 MG tablet TAKE 1 TABLET BY MOUTH EVERY DAY  . glimepiride (AMARYL) 4 MG tablet Take 8 mg by mouth daily before breakfast.   . glucose blood (ONE TOUCH ULTRA TEST) test strip USE AS DIRECTED TO CHECK BLOOD SUGAR BID  . latanoprost (XALATAN) 0.005 % ophthalmic solution PLACE 1 GTT INTO THE LEFT EYE ONLY D IN THE EVENING  . levothyroxine (SYNTHROID, LEVOTHROID) 75 MCG tablet Take 75 mcg by mouth every evening. Monday through Saturday  . Multiple Vitamins-Minerals (ICAPS AREDS 2) CAPS Take 1 capsule by mouth 2 (two) times daily.  . Omega-3 Fatty Acids (OMEGA 3 PO) Take 1,600 mg by mouth every other day.  . rosuvastatin (CRESTOR) 10 MG tablet Take 1 tablet (10 mg  total) by mouth daily at 6 PM.  . sacubitril-valsartan (ENTRESTO) 97-103 MG Take 1 tablet by mouth 2 (two) times daily.  . saxagliptin HCl (ONGLYZA) 5 MG TABS tablet Take 5 mg by mouth daily.  Marland Kitchen spironolactone (ALDACTONE) 25 MG tablet Take 0.5 tablets (12.5 mg total) by mouth daily.  . TURMERIC CURCUMIN PO Take 1 tablet by mouth every other day.     Allergies:   Bee venom, Demerol [meperidine], Pioglitazone, Shrimp [shellfish allergy], and Sulfa antibiotics   Social History   Tobacco Use  . Smoking status: Never Smoker  . Smokeless tobacco: Never Used  Substance Use Topics  . Alcohol use: Yes    Comment: rare wine use  . Drug use: No     Family Hx: The patient's family history  includes Cancer in his mother; Hypertension in his sister; Kidney disease in his brother.  ROS:   Please see the history of present illness.     All other systems reviewed and are negative.   Labs/Other Tests and Data Reviewed:    Recent Labs: 07/31/2018: ALT 18; Hemoglobin 12.0; Platelets 134 12/03/2018: BUN 18; Creatinine, Ser 1.34; Potassium 4.4; Sodium 147   Recent Lipid Panel Lab Results  Component Value Date/Time   TRIG 68 06/10/2014 04:00 AM    Wt Readings from Last 3 Encounters:  06/20/19 (!) 310 lb (140.6 kg)  02/13/19 (!) 314 lb (142.4 kg)  12/19/18 (!) 315 lb (142.9 kg)     Objective:    Vital Signs:  BP (!) 93/50   Pulse 64   Ht 5' 11.5" (1.816 m)   Wt (!) 310 lb (140.6 kg)   BMI 42.63 kg/m    CONSTITUTIONAL:  Well nourished, well developed male in no acute distress.  EYES: anicteric MOUTH: oral mucosa is pink RESPIRATORY: Normal respiratory effort, symmetric expansion CARDIOVASCULAR: No peripheral edema SKIN: No rash, lesions or ulcers MUSCULOSKELETAL: no digital cyanosis NEURO: Cranial Nerves II-XII grossly intact, moves all extremities PSYCH: Intact judgement and insight.  A&O x 3, Mood/affect appropriate   ASSESSMENT & PLAN:    1.  OSA -  The patient is tolerating  PAP therapy well without any problems. The PAP download was reviewed today and showed an AHI of 7.5/hr on 12 cm H2O with 97% compliance in using more than 4 hours nightly.  The patient has been using and benefiting from PAP use and will continue to benefit from therapy. I will increase CPAP to 13cm H2O and repeat download in 2 weeks.  2.  HTN -He says that his BP is usually soft and he feels fine continue Carvedilol 12.5mg  BID, Entresto 97-103mg  BID and Spiro 12.5mg  daily -check BMET  3.  Morbid Obesity -I have encouraged him to get into a routine exercise program and cut back on carbs and portions.     COVID-19 Education: The signs and symptoms of COVID-19 were discussed with the patient and how to seek care for testing (follow up with PCP or arrange E-visit).  The importance of social distancing was discussed today.  Patient Risk:   After full review of this patient's clinical status, I feel that they are at least moderate risk at this time.  Time:   Today, I have spent 20 minutes on telemedicine discussing medical problems including OSA, HTN, obesity and reviewing patient's chart including sleep study x 2, PAP titration and PAP compliance download.  Medication Adjustments/Labs and Tests Ordered: Current medicines are reviewed at length with the patient today.  Concerns regarding medicines are outlined above.  Tests Ordered: No orders of the defined types were placed in this encounter.  Medication Changes: No orders of the defined types were placed in this encounter.   Disposition:  Follow up in 1 year(s)  Signed, Fransico Him, MD  06/20/2019 9:50 AM    [facility]

## 2019-06-20 ENCOUNTER — Telehealth: Payer: Self-pay | Admitting: *Deleted

## 2019-06-20 ENCOUNTER — Telehealth (INDEPENDENT_AMBULATORY_CARE_PROVIDER_SITE_OTHER): Payer: Medicare PPO | Admitting: Cardiology

## 2019-06-20 ENCOUNTER — Other Ambulatory Visit: Payer: Self-pay

## 2019-06-20 ENCOUNTER — Encounter: Payer: Self-pay | Admitting: Cardiology

## 2019-06-20 VITALS — BP 93/50 | HR 64 | Ht 71.5 in | Wt 310.0 lb

## 2019-06-20 DIAGNOSIS — Z7189 Other specified counseling: Secondary | ICD-10-CM

## 2019-06-20 DIAGNOSIS — G4733 Obstructive sleep apnea (adult) (pediatric): Secondary | ICD-10-CM

## 2019-06-20 DIAGNOSIS — I10 Essential (primary) hypertension: Secondary | ICD-10-CM

## 2019-06-20 NOTE — Addendum Note (Signed)
Addended by: Antonieta Iba on: 06/20/2019 10:05 AM   Modules accepted: Orders

## 2019-06-20 NOTE — Telephone Encounter (Signed)
-----   Message from Sueanne Margarita, MD sent at 06/20/2019  9:39 AM EDT ----- Increase CPAP to 13cm H2O and get a download in 2 weeks.  FOllowup with me in 1 year

## 2019-06-20 NOTE — Patient Instructions (Signed)
Medication Instructions:  Your physician recommends that you continue on your current medications as directed. Please refer to the Current Medication list given to you today.  *If you need a refill on your cardiac medications before your next appointment, please call your pharmacy*   Lab Work: BMET If you have labs (blood work) drawn today and your tests are completely normal, you will receive your results only by: Marland Kitchen MyChart Message (if you have MyChart) OR . A paper copy in the mail If you have any lab test that is abnormal or we need to change your treatment, we will call you to review the results.  Follow-Up: At [facility], you and your health needs are our priority.  As part of our continuing mission to provide you with exceptional heart care, we have created designated Provider Care Teams.  These Care Teams include your primary Cardiologist (physician) and Advanced Practice Providers (APPs -  Physician Assistants and Nurse Practitioners) who all work together to provide you with the care you need, when you need it.    Your next appointment:   1 year(s)  The format for your next appointment:   Either In Person or Virtual  Provider:   Fransico Him, MD

## 2019-06-20 NOTE — Telephone Encounter (Signed)
Order placed to [facility] via community message. 

## 2019-06-29 DIAGNOSIS — G4733 Obstructive sleep apnea (adult) (pediatric): Secondary | ICD-10-CM | POA: Diagnosis not present

## 2019-07-02 DIAGNOSIS — G4733 Obstructive sleep apnea (adult) (pediatric): Secondary | ICD-10-CM | POA: Diagnosis not present

## 2019-07-03 ENCOUNTER — Telehealth: Payer: Self-pay

## 2019-07-03 NOTE — Telephone Encounter (Signed)
Patient spoke with Gershon Cull, asking about lab work.  Spoke with patient and he states that he had lab work done with Dr. Criss Rosales a few weeks ago so he was wondering if he needed to repeat labs with [place]. Advised patient that I would call Dr. Perley Jain office and have them fax over lab work and if it was Dr. Radford Pax wanted then he will not need to it repeated.

## 2019-07-08 ENCOUNTER — Encounter: Payer: Self-pay | Admitting: Internal Medicine

## 2019-07-08 DIAGNOSIS — M189 Osteoarthritis of first carpometacarpal joint, unspecified: Secondary | ICD-10-CM | POA: Diagnosis not present

## 2019-07-08 DIAGNOSIS — N189 Chronic kidney disease, unspecified: Secondary | ICD-10-CM | POA: Diagnosis not present

## 2019-07-08 DIAGNOSIS — I10 Essential (primary) hypertension: Secondary | ICD-10-CM | POA: Diagnosis not present

## 2019-07-08 DIAGNOSIS — E038 Other specified hypothyroidism: Secondary | ICD-10-CM | POA: Diagnosis not present

## 2019-07-08 DIAGNOSIS — E1169 Type 2 diabetes mellitus with other specified complication: Secondary | ICD-10-CM | POA: Diagnosis not present

## 2019-07-08 DIAGNOSIS — M1991 Primary osteoarthritis, unspecified site: Secondary | ICD-10-CM | POA: Diagnosis not present

## 2019-07-14 ENCOUNTER — Other Ambulatory Visit: Payer: Self-pay | Admitting: Internal Medicine

## 2019-07-14 ENCOUNTER — Ambulatory Visit (INDEPENDENT_AMBULATORY_CARE_PROVIDER_SITE_OTHER): Payer: Medicare PPO

## 2019-07-14 DIAGNOSIS — Z95810 Presence of automatic (implantable) cardiac defibrillator: Secondary | ICD-10-CM | POA: Diagnosis not present

## 2019-07-14 DIAGNOSIS — I5022 Chronic systolic (congestive) heart failure: Secondary | ICD-10-CM

## 2019-07-15 ENCOUNTER — Telehealth: Payer: Self-pay | Admitting: *Deleted

## 2019-07-15 NOTE — Telephone Encounter (Signed)
-----   Message from Sueanne Margarita, MD sent at 07/11/2019  3:29 PM EDT ----- Mask appears to be leaking and AHI is mildly elevated - please find out what mask he uses and if his mask is leaking

## 2019-07-15 NOTE — Telephone Encounter (Signed)
Patient uses a Large size [employer] Full Face Mask Simplus mask Patient says it was leaking but he got some help from the RT and got it fixed. Now he gets 7-8 hours sleep.

## 2019-07-16 NOTE — Progress Notes (Signed)
EPIC Encounter for ICM Monitoring  Patient Name: Rickey Rice is a 79 y.o. male Date: 07/16/2019 Primary Care Physican: Lucianne Lei, MD Primary Cardiologist:[name] Electrophysiologist: Vergie Living Pacing: 98% 4/21/2021Weight:304lbs  Spoke with patient and denies any fluid symptoms. He is getting healthier and lost 14 pounds.  CorvueThoracic impedance normal.   Prescribed:Furosemide 40 mg 1 tablet daily.  Labs: 12/03/2018 Creatinine 1.34, BUN 18, Potassium 4.4, Sodium 147, GFR 07/31/2018 Creatinine1.13, BUN13, Potassium3.8, Sodium144, GFR>60 A complete set of results can be found in Results Review.  Recommendations:No changes and encouraged to call if experiencing any fluid symptoms.  Follow-up plan: ICM clinic phone appointment on5/24/2021. 91 day device clinic remote transmission5/08/2019.   Copy of ICM check sent to [place].  3 month ICM trend: 07/14/2019    1 Year ICM trend:       Rosalene Billings, RN 07/16/2019 3:22 PM

## 2019-07-22 ENCOUNTER — Telehealth: Payer: Self-pay | Admitting: *Deleted

## 2019-07-22 NOTE — Telephone Encounter (Signed)
-----   Message from Sueanne Margarita, MD sent at 07/21/2019  3:25 PM EDT ----- Good AHI and compliance.  Continue current PAP settings.

## 2019-07-22 NOTE — Telephone Encounter (Signed)
Patient is aware and agreeable to AHI being within range at 4.5. Patient is aware and agreeable to being in compliance with machine usage Patient is aware and agreeable to no change in current pressures.

## 2019-07-24 DIAGNOSIS — M1991 Primary osteoarthritis, unspecified site: Secondary | ICD-10-CM | POA: Diagnosis not present

## 2019-07-29 DIAGNOSIS — G4733 Obstructive sleep apnea (adult) (pediatric): Secondary | ICD-10-CM | POA: Diagnosis not present

## 2019-07-31 ENCOUNTER — Ambulatory Visit (INDEPENDENT_AMBULATORY_CARE_PROVIDER_SITE_OTHER): Payer: Medicare PPO | Admitting: *Deleted

## 2019-07-31 DIAGNOSIS — I428 Other cardiomyopathies: Secondary | ICD-10-CM

## 2019-07-31 LAB — CUP PACEART REMOTE DEVICE CHECK
Battery Remaining Longevity: 36 mo
Battery Remaining Percentage: 39 %
Battery Voltage: 2.96 V
Brady Statistic AP VP Percent: 1 %
Brady Statistic AP VS Percent: 1 %
Brady Statistic AS VP Percent: 98 %
Brady Statistic AS VS Percent: 1 %
Brady Statistic RA Percent Paced: 1 %
Date Time Interrogation Session: 20210506062648
HighPow Impedance: 64 Ohm
HighPow Impedance: 64 Ohm
Implantable Lead Implant Date: 20160324
Implantable Lead Implant Date: 20160324
Implantable Lead Implant Date: 20160324
Implantable Lead Location: 753858
Implantable Lead Location: 753859
Implantable Lead Location: 753860
Implantable Lead Model: 7122
Implantable Pulse Generator Implant Date: 20160324
Lead Channel Impedance Value: 390 Ohm
Lead Channel Impedance Value: 460 Ohm
Lead Channel Impedance Value: 510 Ohm
Lead Channel Pacing Threshold Amplitude: 0.75 V
Lead Channel Pacing Threshold Amplitude: 0.75 V
Lead Channel Pacing Threshold Amplitude: 1 V
Lead Channel Pacing Threshold Pulse Width: 0.5 ms
Lead Channel Pacing Threshold Pulse Width: 0.5 ms
Lead Channel Pacing Threshold Pulse Width: 0.5 ms
Lead Channel Sensing Intrinsic Amplitude: 12 mV
Lead Channel Sensing Intrinsic Amplitude: 2.1 mV
Lead Channel Setting Pacing Amplitude: 1 V
Lead Channel Setting Pacing Amplitude: 2 V
Lead Channel Setting Pacing Amplitude: 2.5 V
Lead Channel Setting Pacing Pulse Width: 0.5 ms
Lead Channel Setting Pacing Pulse Width: 0.5 ms
Lead Channel Setting Sensing Sensitivity: 0.5 mV
Pulse Gen Serial Number: 7240350

## 2019-07-31 NOTE — Progress Notes (Signed)
Remote ICD transmission.   

## 2019-08-04 ENCOUNTER — Encounter: Payer: Self-pay | Admitting: Physician Assistant

## 2019-08-04 ENCOUNTER — Telehealth (INDEPENDENT_AMBULATORY_CARE_PROVIDER_SITE_OTHER): Payer: Medicare PPO | Admitting: Physician Assistant

## 2019-08-04 VITALS — BP 106/59 | HR 68 | Ht 71.0 in | Wt 302.0 lb

## 2019-08-04 DIAGNOSIS — N183 Chronic kidney disease, stage 3 unspecified: Secondary | ICD-10-CM

## 2019-08-04 DIAGNOSIS — G4733 Obstructive sleep apnea (adult) (pediatric): Secondary | ICD-10-CM

## 2019-08-04 DIAGNOSIS — I10 Essential (primary) hypertension: Secondary | ICD-10-CM | POA: Diagnosis not present

## 2019-08-04 DIAGNOSIS — E119 Type 2 diabetes mellitus without complications: Secondary | ICD-10-CM | POA: Diagnosis not present

## 2019-08-04 DIAGNOSIS — E039 Hypothyroidism, unspecified: Secondary | ICD-10-CM | POA: Diagnosis not present

## 2019-08-04 DIAGNOSIS — N179 Acute kidney failure, unspecified: Secondary | ICD-10-CM

## 2019-08-04 DIAGNOSIS — Z95810 Presence of automatic (implantable) cardiac defibrillator: Secondary | ICD-10-CM

## 2019-08-04 DIAGNOSIS — Z9989 Dependence on other enabling machines and devices: Secondary | ICD-10-CM | POA: Diagnosis not present

## 2019-08-04 DIAGNOSIS — I5022 Chronic systolic (congestive) heart failure: Secondary | ICD-10-CM | POA: Diagnosis not present

## 2019-08-04 MED ORDER — FUROSEMIDE 40 MG PO TABS: 20.0000 mg | ORAL_TABLET | Freq: Every day | ORAL | 1 refills | Status: DC

## 2019-08-04 NOTE — Progress Notes (Signed)
Virtual Visit via Telephone Note   This visit type was conducted due to national recommendations for restrictions regarding the COVID-19 Pandemic (e.g. social distancing) in an effort to limit this patient's exposure and mitigate transmission in our community.  Due to his co-morbid illnesses, this patient is at least at moderate risk for complications without adequate follow up.  This format is felt to be most appropriate for this patient at this time.  The patient did not have access to video technology/had technical difficulties with video requiring transitioning to audio format only (telephone).  All issues noted in this document were discussed and addressed.  No physical exam could be performed with this format.  Please refer to the patient's chart for his  consent to telehealth for [facility].   The patient was identified using 2 identifiers.  Date:  08/06/2019   ID:  Rickey Rice, DOB 29-Aug-1940, MRN [id number]  Patient Location: Home Provider Location: Home  PCP:  Lucianne Lei, MD  Cardiologist:  Pixie Casino, MD  Electrophysiologist:  None   Evaluation Performed:  Follow-Up Visit  Chief Complaint:  followup  History of Present Illness:    Rickey Rice is a 79 y.o. male with past medical history of DM 2, hypertension, hypothyroidism, NICM, VF arrest s/p CRT-D and morbid obesity.  He has a history of negative Myoview in 2010 and 2014.  He had a cardiac arrest in March 2016 and eventually underwent CRT-D therapy.  Initially after CRT-D therapy, he only had pacing from his RV lead, however LV lead was later activated.  Echocardiogram obtained in March 2016 showed EF 40%, diffuse hypokinesis with abnormal septal motion from left bundle branch block.  Cardiac catheterization performed by Dr. Ellyn Hack on 06/12/2014 showed diffuse mild less than 30% disease in LAD system, diffuse mild luminal irregularities in dominant RCA, no significant coronary artery disease to explain his  cardiac arrest.    Since the placement of the defibrillator, he did undergo appropriate shock for ventricular tachycardia. Beta-blocker was increased to help with rate control and the suppression of ventricular ectopy. Repeat echocardiogram obtained in 2017 showed EF down to 30% and ejection fraction further went down to 25% by echocardiogram in February 2020.  He was diagnosed with obstructive sleep apnea, this was managed by Dr. Radford Pax.  He was last seen by Dr. Debara Pickett on 02/13/2019, there was no sign of volume overload.  Aldactone was added to his medical regimen.  Repeat echocardiogram obtained in February 2021 showed the ejection fraction has improved slightly to 35 to 40%, grade 1 DD, RVSP 22.5 mmHg, mild MR.  Patient presents today for virtual visit.  He has had 100% compliance with CPAP therapy and has noted more energy recently.  He has also managed to lose additional weight as well.  He denies any recent chest pain, worsening shortness of breath, lower extremity edema, orthopnea or PND.  He did have repeat blood work several weeks ago and noted his creatinine has jumped up to 1.67.  However he says his primary care provider did not change any medication at the time.  I am concerned of dehydration since he is on Lasix, Entresto and spironolactone.  I recommended decreasing Lasix to 20 mg daily and repeat basic metabolic panel in 1 to 2 weeks.  Otherwise if renal function is stable and electrolytes are stable, he can follow-up in 6 months.  He is aware to call cardiology if he start developing worsening lower extremity edema, orthopnea or PND.  The patient does not have symptoms concerning for COVID-19 infection (fever, chills, cough, or new shortness of breath).    Past Medical History:  Diagnosis Date  . AICD (automatic cardioverter/defibrillator) present   . Arthritis    oa  . Cardiac arrest ([place]) 06/07/2014   Primary VF arrest with successful resuscitation and s/p ICD implant  . Diabetes  mellitus without complication ([place])    type 2  . Dyslipidemia   . History of nuclear stress test 04/2012   lexiscan - 2 day protocol; low risk study, evidence of inferrior & apical scar but no ischemia   . Hypertension   . Hypothyroidism   . LBBB (left bundle branch block)   . Morbid obesity ([place])   . NICM (nonischemic cardiomyopathy) ([place])   . Prostate enlargement    pt denies   Past Surgical History:  Procedure Laterality Date  . BI-VENTRICULAR IMPLANTABLE CARDIOVERTER DEFIBRILLATOR N/A 06/18/2014   STJ CRTD implanted by Dr [name]  . COLONOSCOPY W/ BIOPSIES    . COLONOSCOPY WITH PROPOFOL N/A 04/05/2017   Procedure: COLONOSCOPY WITH PROPOFOL;  Surgeon: Juanita Craver, MD;  Location: [facility];  Service: Endoscopy;  Laterality: N/A;  . [place]   1 st joint  right hand amputated  . KNEE ARTHROSCOPY Left   . LEFT HEART CATHETERIZATION WITH CORONARY ANGIOGRAM N/A 06/12/2014   Procedure: LEFT HEART CATHETERIZATION WITH CORONARY ANGIOGRAM;  Surgeon: Leonie Man, MD;  Location: [facility] CATH LAB;  Service: Cardiovascular;  Laterality: N/A;  . [place]  . TOTAL KNEE ARTHROPLASTY Right 02/05/2013   Procedure: TOTAL KNEE ARTHROPLASTY;  Surgeon: Kerin Salen, MD;  Location: [facility];  Service: Orthopedics;  Laterality: Right;  . TOTAL KNEE ARTHROPLASTY Left 01/19/2014   Procedure: LEFT TOTAL KNEE ARTHROPLASTY;  Surgeon: Kerin Salen, MD;  Location: [facility];  Service: Orthopedics;  Laterality: Left;     Current Meds  Medication Sig  . acetaminophen (TYLENOL) 500 MG tablet Take 500 mg by mouth daily as needed for moderate pain or headache.  Marland Kitchen aspirin EC 81 MG tablet Take 81 mg by mouth daily.  . brimonidine (ALPHAGAN) 0.2 % ophthalmic solution Place 1 drop into the left eye 3 (three) times daily.  . carboxymethylcellulose (REFRESH PLUS) 0.5 % SOLN Place 1 drop into both eyes daily as needed (dry eyes).  . carvedilol (COREG) 12.5 MG tablet TAKE 1 TABLET BY MOUTH TWICE DAILY  WITH A MEAL  . furosemide (LASIX) 40 MG tablet Take 0.5 tablets (20 mg total) by mouth daily.  Marland Kitchen glimepiride (AMARYL) 4 MG tablet Take 8 mg by mouth daily before breakfast.   . glucose blood (ONE TOUCH ULTRA TEST) test strip USE AS DIRECTED TO CHECK BLOOD SUGAR BID  . levothyroxine (SYNTHROID, LEVOTHROID) 75 MCG tablet Take 75 mcg by mouth every evening. Monday through Saturday  . Multiple Vitamins-Minerals (ICAPS AREDS 2) CAPS Take 1 capsule by mouth 2 (two) times daily.  . Omega-3 Fatty Acids (OMEGA 3 PO) Take 1,600 mg by mouth every other day.  . rosuvastatin (CRESTOR) 10 MG tablet Take 1 tablet (10 mg total) by mouth daily at 6 PM.  . sacubitril-valsartan (ENTRESTO) 97-103 MG Take 1 tablet by mouth 2 (two) times daily.  . saxagliptin HCl (ONGLYZA) 5 MG TABS tablet Take 5 mg by mouth daily.  Marland Kitchen spironolactone (ALDACTONE) 25 MG tablet Take 0.5 tablets (12.5 mg total) by mouth daily.  . TURMERIC CURCUMIN PO Take 1 tablet by mouth every other  day.  . [DISCONTINUED] furosemide (LASIX) 40 MG tablet TAKE 1 TABLET BY MOUTH EVERY DAY     Allergies:   Bee venom, Demerol [meperidine], Pioglitazone, Shrimp [shellfish allergy], and Sulfa antibiotics   Social History   Tobacco Use  . Smoking status: Never Smoker  . Smokeless tobacco: Never Used  Substance Use Topics  . Alcohol use: Yes    Comment: rare wine use  . Drug use: No     Family Hx: The patient's family history includes Cancer in his mother; Hypertension in his sister; Kidney disease in his brother.  ROS:   Please see the history of present illness.     All other systems reviewed and are negative.   Prior CV studies:   The following studies were reviewed today:   Echo 05/13/2019 1. Left ventricular ejection fraction, by estimation, is 35 to 40%. The  left ventricle has moderately decreased function. The left ventricle  demonstrates global hypokinesis. Left ventricular diastolic parameters are  consistent with Grade I  diastolic  dysfunction (impaired relaxation).  2. Right ventricular systolic function is low normal. The right  ventricular size is moderately enlarged. There is normal pulmonary artery  systolic pressure. The estimated right ventricular systolic pressure is  [id number] mmHg.  3. The mitral valve is grossly normal. Mild mitral valve regurgitation.  4. The aortic valve is tricuspid. Aortic valve regurgitation is not  visualized. Mild aortic valve sclerosis is present, with no evidence of  aortic valve stenosis.  5. The inferior vena cava is normal in size with greater than 50%  respiratory variability, suggesting right atrial pressure of 3 mmHg.  Labs/Other Tests and Data Reviewed:    EKG:  An ECG dated 02/17/2019 was personally reviewed today and demonstrated:  Paced rhythm  Recent Labs: 12/03/2018: BUN 18; Creatinine, Ser 1.34; Potassium 4.4; Sodium 147   Recent Lipid Panel Lab Results  Component Value Date/Time   TRIG 68 06/10/2014 04:00 AM    Wt Readings from Last 3 Encounters:  08/04/19 (!) 302 lb (137 kg)  06/20/19 (!) 310 lb (140.6 kg)  02/13/19 (!) 314 lb (142.4 kg)     Objective:    Vital Signs:  BP (!) 106/59   Pulse 68   Ht 5\' 11"  (1.803 m)   Wt (!) 302 lb (137 kg)   BMI 42.12 kg/m    VITAL SIGNS:  reviewed  ASSESSMENT & PLAN:    1. Chronic systolic heart failure/nonischemic cardiomyopathy: Ejection fraction improving on the last echocardiogram.  Continue on carvedilol, Entresto and spironolactone.  No recent acute heart failure symptoms  2. Hypertension: Continue on current therapy  3. Hypothyroidism: Continue levothyroxine  4. DM2: Managed by primary care provider  5. History of CRT-D: Followed by Dr. [name]  6. Obstructive sleep apnea: Compliant with CPAP therapy.  7. Acute on chronic renal insufficiency: According to the patient, recent lab work showed worsening renal function.  We will cut back Lasix to 20 mg daily since patient is also on  spironolactone and Entresto.  Repeat basic metabolic panel in 1 to 2 weeks.   COVID-19 Education: The signs and symptoms of COVID-19 were discussed with the patient and how to seek care for testing (follow up with PCP or arrange E-visit).  The importance of social distancing was discussed today.  Time:   Today, I have spent 15 minutes with the patient with telehealth technology discussing the above problems.     Medication Adjustments/Labs and Tests Ordered: Current medicines are reviewed at  length with the patient today.  Concerns regarding medicines are outlined above.   Tests Ordered: Orders Placed This Encounter  Procedures  . Basic metabolic panel    Medication Changes: Meds ordered this encounter  Medications  . furosemide (LASIX) 40 MG tablet    Sig: Take 0.5 tablets (20 mg total) by mouth daily.    Dispense:  90 tablet    Refill:  1    Follow Up:  Either In Person or Virtual in 6 month(s)  Signed, Almyra Deforest, [place]  08/06/2019 11:16 PM    [place]

## 2019-08-04 NOTE — Patient Instructions (Signed)
Your physician has recommended you make the following change in your medication:  DECREASE FUROSEMIDE TO [place]   Your physician recommends that you return for lab work in: BMET IN 1 -[place] wants you to follow-up in: [place] will receive a reminder letter in the mail two months in advance. If you don't receive a letter, please call our office to schedule the follow-up appointment.

## 2019-08-15 DIAGNOSIS — G4733 Obstructive sleep apnea (adult) (pediatric): Secondary | ICD-10-CM | POA: Diagnosis not present

## 2019-08-19 ENCOUNTER — Telehealth: Payer: Self-pay | Admitting: Physician Assistant

## 2019-08-19 ENCOUNTER — Telehealth: Payer: Self-pay

## 2019-08-19 NOTE — Telephone Encounter (Signed)
Spoke with patient to remind of missed remote transmission 

## 2019-08-19 NOTE — Telephone Encounter (Signed)
LVM for pt to be sure he did not need anything acutely. Will forward to [facility] and his CMA to follow up.

## 2019-08-19 NOTE — Telephone Encounter (Signed)
New Message   Pt is calling to speak with Margarita Grizzle short

## 2019-08-19 NOTE — Telephone Encounter (Signed)
Received call from patient.  He reports the device monitoring is not working correctly and unable to send manual remote transmission.   Attempted assistance to send remote but unable to send.  Provided [employer] and direct ICM number. He will call [facility] today and send a remote when monitor is working or if receives a new monitor.  He said Almyra Deforest, [place] decreased Lasix in half at last OV on 5/10 but he started accumulating fluid and returned to previous dosage.  He is going to call the office of Almyra Deforest today to advise he returned to higher dosage of Lasix. No changes today.

## 2019-08-19 NOTE — Telephone Encounter (Signed)
Patient requesting to speak with Almyra Deforest. States he needs to follow up with him but would not disclose what it was about. He is aware that Almyra Deforest is off today and will be back tomorrow.

## 2019-08-20 ENCOUNTER — Telehealth: Payer: Self-pay | Admitting: Internal Medicine

## 2019-08-20 NOTE — Telephone Encounter (Signed)
Left message in both his home phone and cell phone.

## 2019-08-20 NOTE — Telephone Encounter (Signed)
Patient returned call to [facility]. Call successfully transferred to [facility].

## 2019-08-20 NOTE — Telephone Encounter (Signed)
Spoke with Rickey Rice, he did cut his lasix in half, however had worsening LE edema, therefore he is back on a full tablet. His current lasix tablet is 20mg  tablet, therefore he is currently taking 20mg  daily of lasix which is fine with me. He also mentioned his Cr by PCP was not 1.67, it was actually 1.47 which is not much changed when compare to previous lab in 2020.

## 2019-08-22 ENCOUNTER — Telehealth: Payer: Self-pay

## 2019-08-22 NOTE — Telephone Encounter (Signed)
Spoke with patient , Rickey Rice needs him to come into office with his transmitter so that he can trouble shoot. Pt unable to do it today and he has dental surgery 08/26/2019. Please call patient cell when you get this msg and set him up for phys defib check and please let them know industry needs to be present for this appointment.

## 2019-08-22 NOTE — Telephone Encounter (Signed)
Pt called today wanting to know an update about his monitor. He spoke to tech support and they told him that they will reach out to rep to see if he needs a new one or if it can be fixed in office. I spoke with Gaspar Bidding from [facility] and he will look into it and get back to me

## 2019-08-26 DIAGNOSIS — M7062 Trochanteric bursitis, left hip: Secondary | ICD-10-CM | POA: Diagnosis not present

## 2019-08-26 DIAGNOSIS — M7061 Trochanteric bursitis, right hip: Secondary | ICD-10-CM | POA: Diagnosis not present

## 2019-08-29 DIAGNOSIS — G4733 Obstructive sleep apnea (adult) (pediatric): Secondary | ICD-10-CM | POA: Diagnosis not present

## 2019-09-04 DIAGNOSIS — H04123 Dry eye syndrome of bilateral lacrimal glands: Secondary | ICD-10-CM | POA: Diagnosis not present

## 2019-09-04 DIAGNOSIS — H33323 Round hole, bilateral: Secondary | ICD-10-CM | POA: Diagnosis not present

## 2019-09-04 DIAGNOSIS — H0102B Squamous blepharitis left eye, upper and lower eyelids: Secondary | ICD-10-CM | POA: Diagnosis not present

## 2019-09-04 DIAGNOSIS — Z79899 Other long term (current) drug therapy: Secondary | ICD-10-CM | POA: Diagnosis not present

## 2019-09-04 DIAGNOSIS — H353131 Nonexudative age-related macular degeneration, bilateral, early dry stage: Secondary | ICD-10-CM | POA: Diagnosis not present

## 2019-09-04 DIAGNOSIS — H401211 Low-tension glaucoma, right eye, mild stage: Secondary | ICD-10-CM | POA: Diagnosis not present

## 2019-09-04 DIAGNOSIS — H35033 Hypertensive retinopathy, bilateral: Secondary | ICD-10-CM | POA: Diagnosis not present

## 2019-09-04 DIAGNOSIS — H0102A Squamous blepharitis right eye, upper and lower eyelids: Secondary | ICD-10-CM | POA: Diagnosis not present

## 2019-09-05 ENCOUNTER — Other Ambulatory Visit: Payer: Self-pay

## 2019-09-05 ENCOUNTER — Telehealth: Payer: Self-pay

## 2019-09-05 ENCOUNTER — Encounter: Payer: Self-pay | Admitting: Physician Assistant

## 2019-09-05 ENCOUNTER — Ambulatory Visit: Payer: Medicare PPO | Admitting: Physician Assistant

## 2019-09-05 VITALS — BP 100/60 | HR 63 | Ht 71.0 in | Wt 301.0 lb

## 2019-09-05 DIAGNOSIS — Z95810 Presence of automatic (implantable) cardiac defibrillator: Secondary | ICD-10-CM

## 2019-09-05 DIAGNOSIS — I428 Other cardiomyopathies: Secondary | ICD-10-CM

## 2019-09-05 DIAGNOSIS — I5022 Chronic systolic (congestive) heart failure: Secondary | ICD-10-CM | POA: Diagnosis not present

## 2019-09-05 DIAGNOSIS — I472 Ventricular tachycardia, unspecified: Secondary | ICD-10-CM

## 2019-09-05 MED ORDER — FUROSEMIDE 40 MG PO TABS: 40.0000 mg | ORAL_TABLET | Freq: Every day | ORAL | 3 refills | Status: DC

## 2019-09-05 NOTE — Patient Instructions (Addendum)
Medication Instructions:  none *If you need a refill on your cardiac medications before your next appointment, please call your pharmacy*   Lab Work: none If you have labs (blood work) drawn today and your tests are completely normal, you will receive your results only by:  [place] (if you have MyChart) OR  A paper copy in the mail If you have any lab test that is abnormal or we need to change your treatment, we will call you to review the results.   Testing/Procedures: none   Follow-Up: At [facility], you and your health needs are our priority.  As part of our continuing mission to provide you with exceptional heart care, we have created designated Provider Care Teams.  These Care Teams include your primary Cardiologist (physician) and Advanced Practice Providers (APPs -  Physician Assistants and Nurse Practitioners) who all work together to provide you with the care you need, when you need it.    Your next appointment:   1 year  The format for your next appointment:   Either In Person or Virtual  Provider:   Dr [name]   Other Instructions Remote monitoring is used to monitor your ICD from home. This monitoring reduces the number of office visits required to check your device to one time per year. It allows [place] to keep an eye on the functioning of your device to ensure it is working properly. You are scheduled for a device check from home on 10/30/19. You may send your transmission at any time that day. If you have a wireless device, the transmission will be sent automatically. After your physician reviews your transmission, you will receive a postcard with your next transmission date.

## 2019-09-05 NOTE — Telephone Encounter (Signed)
I spoke to the patient and informed him that we needed him to come in at 11:15 due to industry's involvement with his appointment.  He verbalized understanding.

## 2019-09-05 NOTE — Progress Notes (Signed)
Cardiology Office Note Date:  09/05/2019  Patient ID:  Rickey Rice, Rickey Rice [birth year], MRN [id number] PCP:  Lucianne Lei, MD  Cardiologist:  Dr. Debara Pickett EP: Dr. [name] OSA: Dr. Radford Pax    Chief Complaint:  Failed device transmissions  History of Present Illness: Rickey Rice is a 79 y.o. male with history of HTN, HLD, NICM, VF arrest, CRT-D, morbid obesity  Cardiac catheterization performed by Dr. Ellyn Hack on 06/12/2014 showed diffuse mild less than 30% disease in LAD system, diffuse mild luminal irregularities in dominant RCA, no significant coronary artery disease to explain his cardiac arrest.    He comes in today to be seen for Dr. [name], last seen by him July 2020 via tele health visit.  Noted new RV dysfunction on his echo.  Planned for follow up his sleep study and f/u with Dr. Radford Pax.  He was found with severe sleep apnea with subsequent CPCP therapy and eventually with reported compliance.  More recently he saw Rickey Rice, [place] 08/04/2019 for cardiology service via tele medicine.  He was doing well, had lost some weight, using his CPAP and sleeping well, feeling good.  His Creat was noted up some and his lasix reduced.  He had some increased edema and was resumed on his prior dose.  He has been unable to send in a transmission, with apparent issue with his home transmitter.  He comes today for further trouble shooting.  He is doing quite well.  He is so thankful for the diagnosis of sleep apnea and ultimate treatment, he has felt so much better since starting treatment.   He denies any CP, palpitations.  He uses stationary pedals for exercise, gets to about 1.67miles most days. No dizzy spells, near syncope or syncope.   Device information SJM CRT-D, implanted 06/18/2014 Secondary prevention + h/o appropriate tx  AAD None to date  Past Medical History:  Diagnosis Date  . AICD (automatic cardioverter/defibrillator) present   . Arthritis    oa  . Cardiac arrest ([place])  06/07/2014   Primary VF arrest with successful resuscitation and s/p ICD implant  . Diabetes mellitus without complication ([place])    type 2  . Dyslipidemia   . History of nuclear stress test 04/2012   lexiscan - 2 day protocol; low risk study, evidence of inferrior & apical scar but no ischemia   . Hypertension   . Hypothyroidism   . LBBB (left bundle branch block)   . Morbid obesity ([place])   . NICM (nonischemic cardiomyopathy) ([place])   . Prostate enlargement    pt denies    Past Surgical History:  Procedure Laterality Date  . BI-VENTRICULAR IMPLANTABLE CARDIOVERTER DEFIBRILLATOR N/A 06/18/2014   STJ CRTD implanted by Dr [name]  . COLONOSCOPY W/ BIOPSIES    . COLONOSCOPY WITH PROPOFOL N/A 04/05/2017   Procedure: COLONOSCOPY WITH PROPOFOL;  Surgeon: Juanita Craver, MD;  Location: [facility];  Service: Endoscopy;  Laterality: N/A;  . [place]   1 st joint  right hand amputated  . KNEE ARTHROSCOPY Left   . LEFT HEART CATHETERIZATION WITH CORONARY ANGIOGRAM N/A 06/12/2014   Procedure: LEFT HEART CATHETERIZATION WITH CORONARY ANGIOGRAM;  Surgeon: Leonie Man, MD;  Location: [facility];  Service: Cardiovascular;  Laterality: N/A;  . [place]  . TOTAL KNEE ARTHROPLASTY Right 02/05/2013   Procedure: TOTAL KNEE ARTHROPLASTY;  Surgeon: Kerin Salen, MD;  Location: [facility];  Service: Orthopedics;  Laterality: Right;  . TOTAL KNEE ARTHROPLASTY  Left 01/19/2014   Procedure: LEFT TOTAL KNEE ARTHROPLASTY;  Surgeon: Kerin Salen, MD;  Location: [facility];  Service: Orthopedics;  Laterality: Left;    Current Outpatient Medications  Medication Sig Dispense Refill  . acetaminophen (TYLENOL) 500 MG tablet Take 500 mg by mouth daily as needed for moderate pain or headache.    Marland Kitchen aspirin EC 81 MG tablet Take 81 mg by mouth daily.    . brimonidine (ALPHAGAN) 0.2 % ophthalmic solution Place 1 drop into the left eye 3 (three) times daily.    . carboxymethylcellulose (REFRESH PLUS) 0.5 %  SOLN Place 1 drop into both eyes daily.     . carvedilol (COREG) 12.5 MG tablet TAKE 1 TABLET BY MOUTH TWICE DAILY WITH A MEAL 180 tablet 3  . glimepiride (AMARYL) 4 MG tablet Take 8 mg by mouth daily before breakfast.     . glucose blood (ONE TOUCH ULTRA TEST) test strip USE AS DIRECTED TO CHECK BLOOD SUGAR BID    . levothyroxine (SYNTHROID, LEVOTHROID) 75 MCG tablet Take 75 mcg by mouth every evening. Monday through Saturday    . Multiple Vitamins-Minerals (ICAPS AREDS 2) CAPS Take 1 capsule by mouth 2 (two) times daily.    . Omega-3 Fatty Acids (OMEGA 3 PO) Take 1,600 mg by mouth every other day.    . rosuvastatin (CRESTOR) 10 MG tablet Take 1 tablet (10 mg total) by mouth daily at 6 PM. 30 tablet 0  . sacubitril-valsartan (ENTRESTO) 97-103 MG Take 1 tablet by mouth 2 (two) times daily. 60 tablet 11  . saxagliptin HCl (ONGLYZA) 5 MG TABS tablet Take 5 mg by mouth daily.    Marland Kitchen spironolactone (ALDACTONE) 25 MG tablet Take 0.5 tablets (12.5 mg total) by mouth daily. 45 tablet 3  . TURMERIC CURCUMIN PO Take 1 tablet by mouth every other day.    . furosemide (LASIX) 40 MG tablet Take 1 tablet (40 mg total) by mouth daily. 90 tablet 3   No current facility-administered medications for this visit.    Allergies:   Bee venom, Demerol [meperidine], Pioglitazone, Shrimp [shellfish allergy], and Sulfa antibiotics   Social History:  The patient  reports that he has never smoked. He has never used smokeless tobacco. He reports current alcohol use. He reports that he does not use drugs.   Family History:  The patient's family history includes Cancer in his mother; Hypertension in his sister; Kidney disease in his brother.  ROS:  Please see the history of present illness.   All other systems are reviewed and otherwise negative.   PHYSICAL EXAM:  VS:  BP 100/60   Pulse 63   Ht 5\' 11"  (1.803 m)   Wt (!) 301 lb (136.5 kg)   SpO2 95%   BMI 41.98 kg/m  BMI: Body mass index is 41.98 kg/m.  repeat BP  manually by myself is 110/62 Well nourished, well developed, in no acute distress  HEENT: normocephalic, atraumatic  Neck: no JVD, carotid bruits or masses Cardiac:  RRR; no significant murmurs, no rubs, or gallops Lungs: CTA b/l, no wheezing, rhonchi or rales  Abd: soft, nontender, obese MS: no deformity or atrophy Ext: trace edema  Skin: warm and dry, no rash Neuro:  No gross deficits appreciated Psych: euthymic mood, full affect  ICD site is stable, no tethering or discomfort   EKG:  Not done today  ICD interrogation done today by industry and reviewed by myself Battery and lead measurements are good 12 AMS, longest 14seconds,  available EGMs look AT w/VP NSVT  10 seconds 4 seconds LV treshold up slightly 1.0V output increased to keep a 1/4V margin to 1.25V   05/13/2019: TTE IMPRESSIONS  1. Left ventricular ejection fraction, by estimation, is 35 to 40%. The  left ventricle has moderately decreased function. The left ventricle  demonstrates global hypokinesis. Left ventricular diastolic parameters are  consistent with Grade I diastolic  dysfunction (impaired relaxation).  2. Right ventricular systolic function is low normal. The right  ventricular size is moderately enlarged. There is normal pulmonary artery  systolic pressure. The estimated right ventricular systolic pressure is  04.5 mmHg.  3. The mitral valve is grossly normal. Mild mitral valve regurgitation.  4. The aortic valve is tricuspid. Aortic valve regurgitation is not  visualized. Mild aortic valve sclerosis is present, with no evidence of  aortic valve stenosis.  5. The inferior vena cava is normal in size with greater than 50%  respiratory variability, suggesting right atrial pressure of 3 mmHg.   Comparison(s): A prior study was performed on 05/07/2018. Prior images  reviewed side by side. The left ventricular function has improved.     Recent Labs: 12/03/2018: BUN 18; Creatinine, Ser 1.34;  Potassium 4.4; Sodium 147  No results found for requested labs within last 8760 hours.   CrCl cannot be calculated (Patient's most recent lab result is older than the maximum 21 days allowed.).   Wt Readings from Last 3 Encounters:  09/05/19 (!) 301 lb (136.5 kg)  08/04/19 (!) 302 lb (137 kg)  06/20/19 (!) 310 lb (140.6 kg)     Other studies reviewed: Additional studies/records reviewed today include: summarized above  ASSESSMENT AND PLAN:  1. CRT-D     Intact function     Checked by industry given a new cell adapter  2. VT     NSVT only  3. NICM 4. Chronic CHF (systolic)     Last echo noted above Feb 2021, LVEF 35-40%, RV low normal function, mod enlarged     He has trace edema back on his usual furosemide dose     He feels well     CorVue looks OK     C/w Dr. Debara Pickett and team     C/w L. Short, RM, ICM clinic as well    Will have scheduled with Margarita Grizzle to send a transmission ensure transmission works and get him back in track with her.    Disposition: F/u with remotes otherwise Q 58mo and in clinic in 1 year, sooner if needed.      Current medicines are reviewed at length with the patient today.  The patient did not have any concerns regarding medicines.  Venetia Night, PA-C 09/05/2019 12:56 PM     [address] [place] [address] [phone] (office)  [phone] (fax)

## 2019-09-05 NOTE — Progress Notes (Signed)
ICM remote transmission rescheduled for 09/15/2019.

## 2019-09-08 ENCOUNTER — Telehealth: Payer: Self-pay | Admitting: Physician Assistant

## 2019-09-08 ENCOUNTER — Encounter (INDEPENDENT_AMBULATORY_CARE_PROVIDER_SITE_OTHER): Payer: Medicare PPO | Admitting: Ophthalmology

## 2019-09-08 ENCOUNTER — Other Ambulatory Visit: Payer: Self-pay

## 2019-09-08 DIAGNOSIS — H43813 Vitreous degeneration, bilateral: Secondary | ICD-10-CM

## 2019-09-08 DIAGNOSIS — H35033 Hypertensive retinopathy, bilateral: Secondary | ICD-10-CM

## 2019-09-08 DIAGNOSIS — H33301 Unspecified retinal break, right eye: Secondary | ICD-10-CM | POA: Diagnosis not present

## 2019-09-08 DIAGNOSIS — I10 Essential (primary) hypertension: Secondary | ICD-10-CM | POA: Diagnosis not present

## 2019-09-08 NOTE — Telephone Encounter (Signed)
Pt was in on Friday to troubleshoot his pacemaker. He states that the adapter does not fit his home device.

## 2019-09-09 NOTE — Telephone Encounter (Signed)
Spoke with patient and he says when he came into the office to get his device checked and his home remote monitor looked at the rep took out a ethernet cable and replaced it with a usb adapter and the patient didn't realize it didn't fit until he got home. I have ordered the patient a new usb cell adapter and he should get it in a few days. Patient will call when he receives it

## 2019-09-10 DIAGNOSIS — M7062 Trochanteric bursitis, left hip: Secondary | ICD-10-CM | POA: Diagnosis not present

## 2019-09-10 DIAGNOSIS — M7631 Iliotibial band syndrome, right leg: Secondary | ICD-10-CM | POA: Diagnosis not present

## 2019-09-10 DIAGNOSIS — M7632 Iliotibial band syndrome, left leg: Secondary | ICD-10-CM | POA: Diagnosis not present

## 2019-09-10 DIAGNOSIS — M7061 Trochanteric bursitis, right hip: Secondary | ICD-10-CM | POA: Diagnosis not present

## 2019-09-15 ENCOUNTER — Ambulatory Visit (INDEPENDENT_AMBULATORY_CARE_PROVIDER_SITE_OTHER): Payer: Medicare PPO

## 2019-09-15 DIAGNOSIS — Z95810 Presence of automatic (implantable) cardiac defibrillator: Secondary | ICD-10-CM

## 2019-09-15 DIAGNOSIS — I5022 Chronic systolic (congestive) heart failure: Secondary | ICD-10-CM | POA: Diagnosis not present

## 2019-09-16 NOTE — Progress Notes (Signed)
EPIC Encounter for ICM Monitoring  Patient Name: Rickey Rice is a 79 y.o. male Date: 09/16/2019 Primary Care Physican: Lucianne Lei, MD Primary Cardiologist:[name] Electrophysiologist: Vergie Living Pacing: 99% 6/11/2021Weight:301lbs  Spoke with patient and reports feeling well at this time.  Denies fluid symptoms.    CorvueThoracic impedance normal.   Prescribed:Furosemide 40 mg 1 tablet daily.  Labs: 07/08/2019 Creatinine 1.49, BUN 21, Potassium 4.4, Sodium 141, GFR 44-51 05/27/2019 Creatinine 1.36, BUN 18, Potassium 4.5, Sodium 145, GFR 49-57 A complete set of results can be found in Results Review.  Recommendations: No changes and encouraged to call if experiencing any fluid symptoms.  Follow-up plan: ICM clinic phone appointment on7/26/2021. 91 day device clinic remote transmission8/07/2019.   Copy of ICM check sent to [place].  3 month ICM trend: 09/15/2019    1 Year ICM trend:       Rosalene Billings, RN 09/16/2019 8:34 AM

## 2019-09-17 ENCOUNTER — Encounter (INDEPENDENT_AMBULATORY_CARE_PROVIDER_SITE_OTHER): Payer: Medicare PPO | Admitting: Ophthalmology

## 2019-09-17 ENCOUNTER — Other Ambulatory Visit: Payer: Self-pay

## 2019-09-17 DIAGNOSIS — H33301 Unspecified retinal break, right eye: Secondary | ICD-10-CM

## 2019-09-22 NOTE — Telephone Encounter (Signed)
Patient's home monitor is up to date with cell adapter as of 09/20/19. Encounter closed.

## 2019-09-23 DIAGNOSIS — I10 Essential (primary) hypertension: Secondary | ICD-10-CM | POA: Diagnosis not present

## 2019-09-23 DIAGNOSIS — E662 Morbid (severe) obesity with alveolar hypoventilation: Secondary | ICD-10-CM | POA: Diagnosis not present

## 2019-09-23 DIAGNOSIS — E1169 Type 2 diabetes mellitus with other specified complication: Secondary | ICD-10-CM | POA: Diagnosis not present

## 2019-09-28 DIAGNOSIS — G4733 Obstructive sleep apnea (adult) (pediatric): Secondary | ICD-10-CM | POA: Diagnosis not present

## 2019-09-30 ENCOUNTER — Other Ambulatory Visit: Payer: Self-pay | Admitting: Internal Medicine

## 2019-10-13 ENCOUNTER — Encounter (INDEPENDENT_AMBULATORY_CARE_PROVIDER_SITE_OTHER): Payer: Medicare PPO | Admitting: Ophthalmology

## 2019-10-15 DIAGNOSIS — G4733 Obstructive sleep apnea (adult) (pediatric): Secondary | ICD-10-CM | POA: Diagnosis not present

## 2019-10-20 ENCOUNTER — Ambulatory Visit (INDEPENDENT_AMBULATORY_CARE_PROVIDER_SITE_OTHER): Payer: Medicare PPO

## 2019-10-20 DIAGNOSIS — Z95810 Presence of automatic (implantable) cardiac defibrillator: Secondary | ICD-10-CM

## 2019-10-20 DIAGNOSIS — I5022 Chronic systolic (congestive) heart failure: Secondary | ICD-10-CM

## 2019-10-21 DIAGNOSIS — Z95810 Presence of automatic (implantable) cardiac defibrillator: Secondary | ICD-10-CM

## 2019-10-21 DIAGNOSIS — I5022 Chronic systolic (congestive) heart failure: Secondary | ICD-10-CM | POA: Diagnosis not present

## 2019-10-21 IMAGING — CT CT ANGIOGRAPHY NECK
2 of 10 series · 7 of 36 positions shown · IV contrast (Isovue)
Comparison: 09/07/2013

CLINICAL DATA: Lightheaded and dizzy beginning this morning at 9399
hours. Vertigo.

EXAM:
CT ANGIOGRAPHY HEAD AND NECK
TECHNIQUE: Multidetector CT imaging of the head and neck was performed using
the standard protocol during bolus administration of intravenous
contrast. Multiplanar CT image reconstructions and MIPs were
obtained to evaluate the vascular anatomy. Carotid stenosis
measurements (when applicable) are obtained utilizing NASCET
criteria, using the distal internal carotid diameter as the
denominator.
CONTRAST:  75mL OMNIPAQUE IOHEXOL 350 MG/ML SOLN

[Series 10: sagittal soft tissue · sagittal · 0.36mm/px · 1 of 67 slices shown]
[im 23/67  soft-tissue]
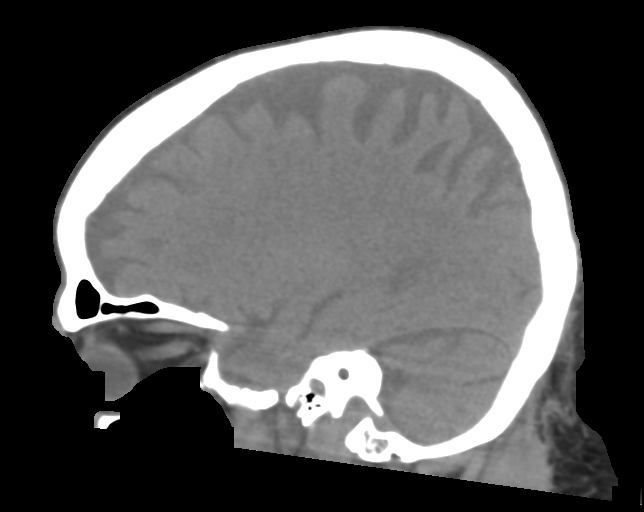

[Series 13: ax thin · axial · 0.39mm/px · z∈[+1321,+1584]mm · 6 of 370 slices shown]
[im 53/370  soft-tissue]
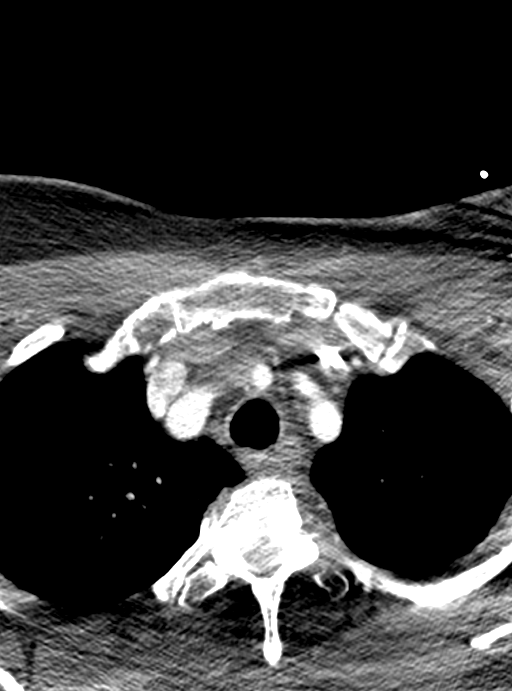
[im 106/370  bone]
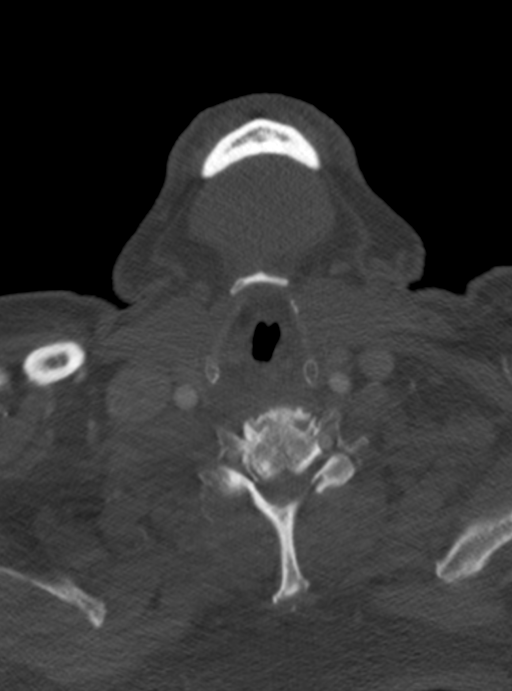
[im 159/370  soft-tissue]
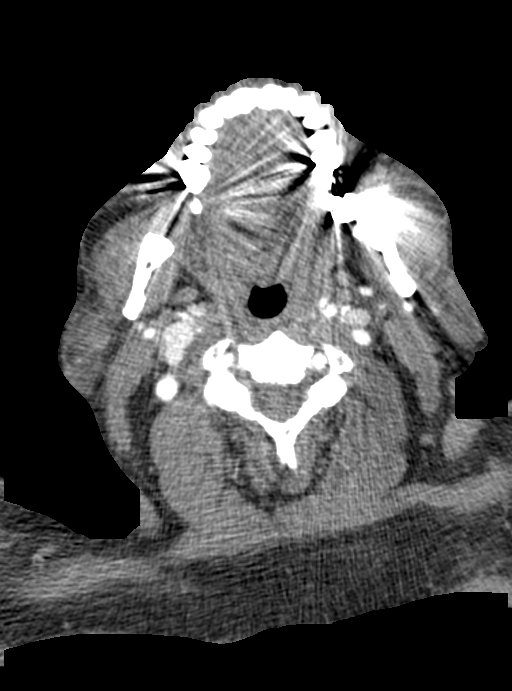
[im 211/370  bone]
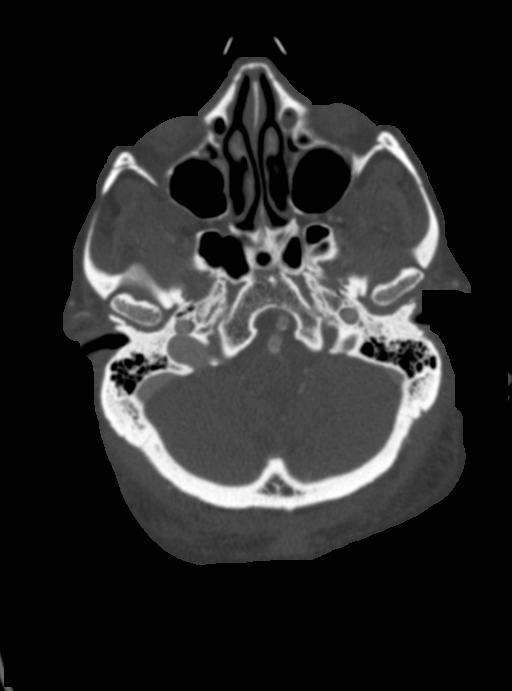
[im 264/370  soft-tissue]
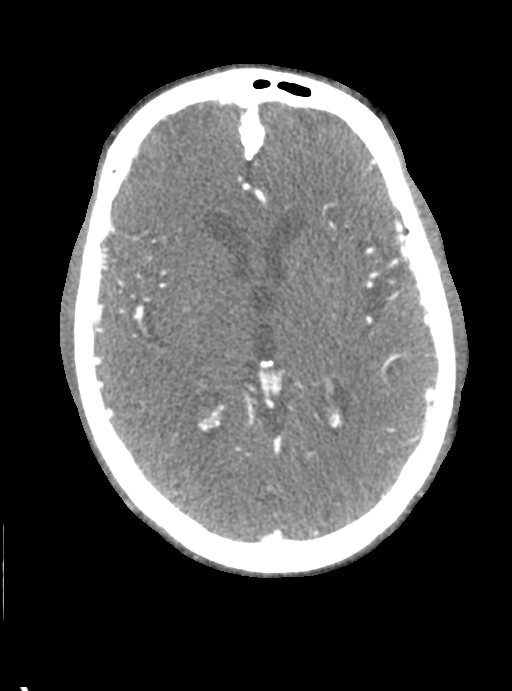
[im 317/370  bone]
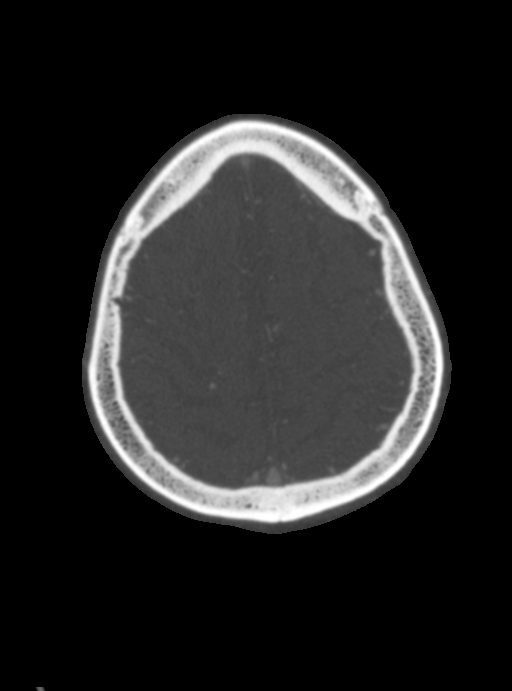

[7 of 36 positions shown; findings below may reference images not displayed]

FINDINGS: CT HEAD FINDINGS

Brain: Generalized atrophy. Mild chronic small-vessel changes of the
cerebral hemispheric white matter. No sign of acute infarction, mass
lesion, hemorrhage, hydrocephalus or extra-axial collection.

Vascular: Extensive atherosclerotic change of the major vessels at
the base of the brain.

Skull: Negative

Sinuses: Clear/normal

Orbits: Normal

Review of the MIP images confirms the above findings

CTA NECK FINDINGS

Aortic arch: Aortic atherosclerosis. No aneurysm or dissection.
Branching pattern is normal.

Right carotid system: Common carotid artery is widely patent to the
bifurcation. Mild atherosclerotic plaque at the carotid bifurcation
but no stenosis or irregularity. Cervical ICA is widely patent.

Left carotid system: Common carotid artery widely patent to the
bifurcation region. Mild atherosclerotic calcification at the ICA
bulb but no stenosis. Cervical ICA widely patent beyond that.

Vertebral arteries: Left vertebral artery origin is widely patent
and normal. Calcified plaque at the right vertebral artery origin
but without stenosis. Both vertebral arteries appear widely patent
through the cervical region to the foramen magnum.

Skeleton: Mid cervical chronic spondylosis and facet arthropathy.

Other neck: No mass or lymphadenopathy.

Upper chest: Lung apices are clear.

Review of the MIP images confirms the above findings

CTA HEAD FINDINGS

Anterior circulation: Both internal carotid arteries are patent
through the skull base and siphon regions. There is atherosclerotic
calcification in the carotid siphon regions but no stenosis. The
anterior and middle cerebral vessels are patent without proximal
stenosis, aneurysm or vascular malformation.

Posterior circulation: Both vertebral arteries are patent at the
foramen magnum and to the basilar. There is atherosclerotic
calcification in both V4 segments. I do not see stenosis greater
than 30%. The left vertebral artery is dolichoectatic and indents
the brainstem. This is usually not clinically significant. No
basilar stenosis. Posterior circulation branch vessels show flow.

Venous sinuses: Patent and normal.

Anatomic variants: None significant. Incidental venous angioma of
the right parietal brain. I think there is also a developmental
venous anomaly of the left dorsal mid brain that probably represents
a venous angioma. No sign of hemorrhage or calcification associated
with that.

Delayed phase: Not done.

Review of the MIP images confirms the above findings
IMPRESSION: No acute vascular finding.

Aortic atherosclerosis. Mild atherosclerosis of the carotid
bifurcations but without stenosis or significant irregularity.

Atherosclerotic change in the carotid siphon regions but without
significant stenosis.

Atherosclerotic change of the V4 segment vertebral arteries on both
sides. The left is tortuous and indents the brainstem slightly.
Usually this is not significant. No evidence of posterior
circulation stenosis or vessel occlusion.

Probable venous angiomas of the right parietal brain and the left
dorsal mid brain. No sign of hemorrhage associated with these. These
are quite likely incidental.

## 2019-10-21 NOTE — Progress Notes (Signed)
EPIC Encounter for ICM Monitoring  Patient Name: CHRISTY FRIEDE is a 79 y.o. male Date: 10/21/2019 Primary Care Physican: Lucianne Lei, MD Primary Cardiologist:[name] Electrophysiologist: Vergie Living Pacing: 99% 7/17/2021Weight:299lbs  Spoke with patient and reports feeling well at this time.  Denies fluid symptoms.  Patient working on improving his health.  CorvueThoracic impedance normal.   Prescribed:Furosemide 40 mg 1 tablet daily.  Labs: 07/08/2019 Creatinine 1.49, BUN 21, Potassium 4.4, Sodium 141, GFR 44-51 05/27/2019 Creatinine 1.36, BUN 18, Potassium 4.5, Sodium 145, GFR 49-57 A complete set of results can be found in Results Review.  Recommendations: No changes and encouraged to call if experiencing any fluid symptoms.  Follow-up plan: ICM clinic phone appointment on7/26/2021. 91 day device clinic remote transmission8/07/2019.    EP/Cardiology Office Visits: Recalls for 01/31/2020 with Dr. Debara Pickett and 09/15/2020 with Dr [name].    Copy of ICM check sent to Dr. [name].   3 month ICM trend: 10/20/2019    1 Year ICM trend:      Rosalene Billings, RN 10/21/2019 2:59 PM

## 2019-10-29 DIAGNOSIS — G4733 Obstructive sleep apnea (adult) (pediatric): Secondary | ICD-10-CM | POA: Diagnosis not present

## 2019-10-30 ENCOUNTER — Ambulatory Visit (INDEPENDENT_AMBULATORY_CARE_PROVIDER_SITE_OTHER): Payer: Medicare PPO | Admitting: *Deleted

## 2019-10-30 DIAGNOSIS — I472 Ventricular tachycardia, unspecified: Secondary | ICD-10-CM

## 2019-10-30 LAB — CUP PACEART REMOTE DEVICE CHECK
Battery Remaining Longevity: 32 mo
Battery Remaining Percentage: 36 %
Battery Voltage: 2.95 V
Brady Statistic AP VP Percent: 1 %
Brady Statistic AP VS Percent: 1 %
Brady Statistic AS VP Percent: 99 %
Brady Statistic AS VS Percent: 1 %
Brady Statistic RA Percent Paced: 1 %
Date Time Interrogation Session: 20210805020015
HighPow Impedance: 64 Ohm
HighPow Impedance: 64 Ohm
Implantable Lead Implant Date: 20160324
Implantable Lead Implant Date: 20160324
Implantable Lead Implant Date: 20160324
Implantable Lead Location: 753858
Implantable Lead Location: 753859
Implantable Lead Location: 753860
Implantable Lead Model: 7122
Implantable Pulse Generator Implant Date: 20160324
Lead Channel Impedance Value: 390 Ohm
Lead Channel Impedance Value: 450 Ohm
Lead Channel Impedance Value: 510 Ohm
Lead Channel Pacing Threshold Amplitude: 1 V
Lead Channel Pacing Threshold Amplitude: 1 V
Lead Channel Pacing Threshold Amplitude: 1 V
Lead Channel Pacing Threshold Pulse Width: 0.5 ms
Lead Channel Pacing Threshold Pulse Width: 0.5 ms
Lead Channel Pacing Threshold Pulse Width: 0.5 ms
Lead Channel Sensing Intrinsic Amplitude: 12 mV
Lead Channel Sensing Intrinsic Amplitude: 2.7 mV
Lead Channel Setting Pacing Amplitude: 1.25 V
Lead Channel Setting Pacing Amplitude: 2 V
Lead Channel Setting Pacing Amplitude: 2.5 V
Lead Channel Setting Pacing Pulse Width: 0.5 ms
Lead Channel Setting Pacing Pulse Width: 0.5 ms
Lead Channel Setting Sensing Sensitivity: 0.5 mV
Pulse Gen Serial Number: 7240350

## 2019-10-31 NOTE — Progress Notes (Signed)
Remote ICD transmission.   

## 2019-11-16 ENCOUNTER — Other Ambulatory Visit: Payer: Self-pay | Admitting: Internal Medicine

## 2019-11-24 ENCOUNTER — Ambulatory Visit (INDEPENDENT_AMBULATORY_CARE_PROVIDER_SITE_OTHER): Payer: Medicare PPO

## 2019-11-24 DIAGNOSIS — I5022 Chronic systolic (congestive) heart failure: Secondary | ICD-10-CM | POA: Diagnosis not present

## 2019-11-24 DIAGNOSIS — Z95810 Presence of automatic (implantable) cardiac defibrillator: Secondary | ICD-10-CM

## 2019-11-27 ENCOUNTER — Telehealth: Payer: Self-pay

## 2019-11-27 NOTE — Telephone Encounter (Signed)
The pt states we did not get the transmission because he is on the road. He will try to send one tonight.

## 2019-11-28 ENCOUNTER — Telehealth: Payer: Self-pay | Admitting: *Deleted

## 2019-11-28 NOTE — Progress Notes (Signed)
EPIC Encounter for ICM Monitoring  Patient Name: Rickey Rice is a 79 y.o. male Date: 11/28/2019 Primary Care Physican: Lucianne Lei, MD Primary Cardiologist:[name] Electrophysiologist: Vergie Living Pacing: 99% 7/17/2021Weight:299lbs  Spoke with patient and reports feeling well at this time.  Denies fluid symptoms.    CorvueThoracic impedance normal.   Prescribed:Furosemide 40 mg 1 tablet daily.  Labs: 07/08/2019 Creatinine 1.49, BUN 21, Potassium 4.4, Sodium 141, GFR 44-51 05/27/2019 Creatinine 1.36, BUN 18, Potassium 4.5, Sodium 145, GFR 49-57 A complete set of results can be found in Results Review.  Recommendations:No changes and encouraged to call if experiencing any fluid symptoms.  Follow-up plan: ICM clinic phone appointment on10/06/2019. 91 day device clinic remote transmission11/06/2019.    EP/Cardiology Office Visits: Recalls for 01/31/2020 with Dr. Debara Pickett and 09/15/2020 with Dr [name].    Copy of ICM check sent to Dr. [name].   3 month ICM trend: 11/28/2019    1 Year ICM trend:       Rosalene Billings, RN 11/28/2019 8:04 AM

## 2019-11-28 NOTE — Telephone Encounter (Signed)
ICD transmission received 11/28/19 for monthly ICM check. 1 NSVT episode noted on 11/09/19, 9 sec duration, 187-222bpm, initially in monitor zone.   Routed to Dr. [name] to confirm no recommended changes.

## 2019-11-29 DIAGNOSIS — G4733 Obstructive sleep apnea (adult) (pediatric): Secondary | ICD-10-CM | POA: Diagnosis not present

## 2019-12-01 NOTE — Telephone Encounter (Signed)
No changes

## 2019-12-02 ENCOUNTER — Telehealth: Payer: Self-pay | Admitting: Internal Medicine

## 2019-12-02 NOTE — Telephone Encounter (Signed)
      Pt c/o medication issue:  1. Name of Medication:   ENTRESTO 97-103 MG    2. How are you currently taking this medication (dosage and times per day)? TAKE 1 TABLET BY MOUTH TWICE DAILY  3. Are you having a reaction (difficulty breathing--STAT)?   4. What is your medication issue? Pt said he took 2 entresto this morning by accident. He said right now he doesn't have any unusual symptoms, he just anxious

## 2019-12-02 NOTE — Telephone Encounter (Signed)
Spoke to patient Dr.[name]'s advice given.Stated he already put the other Entresto dose away.

## 2019-12-02 NOTE — Telephone Encounter (Signed)
Spoke to patient he stated he accidentally took 2 doses of Entresto this morning.Stated he took 97/103 mg along with a 49/51 mg.Stated B/P 112/64,106/64. Pulse 59 to 61.Advised to hold pm dose of Entresto today.I will make Dr.[name] aware.

## 2019-12-02 NOTE — Telephone Encounter (Signed)
Thanks Rickey Rice - I agree with that recommendation. He should discard the lower dose to avoid this in the future.  Dr Lemmie Evens

## 2019-12-05 DIAGNOSIS — G4733 Obstructive sleep apnea (adult) (pediatric): Secondary | ICD-10-CM | POA: Diagnosis not present

## 2019-12-09 DIAGNOSIS — H0102A Squamous blepharitis right eye, upper and lower eyelids: Secondary | ICD-10-CM | POA: Diagnosis not present

## 2019-12-09 DIAGNOSIS — H00012 Hordeolum externum right lower eyelid: Secondary | ICD-10-CM | POA: Diagnosis not present

## 2019-12-09 DIAGNOSIS — H401211 Low-tension glaucoma, right eye, mild stage: Secondary | ICD-10-CM | POA: Diagnosis not present

## 2019-12-09 DIAGNOSIS — H04123 Dry eye syndrome of bilateral lacrimal glands: Secondary | ICD-10-CM | POA: Diagnosis not present

## 2019-12-09 DIAGNOSIS — H401222 Low-tension glaucoma, left eye, moderate stage: Secondary | ICD-10-CM | POA: Diagnosis not present

## 2019-12-09 DIAGNOSIS — H353131 Nonexudative age-related macular degeneration, bilateral, early dry stage: Secondary | ICD-10-CM | POA: Diagnosis not present

## 2019-12-17 ENCOUNTER — Telehealth: Payer: Self-pay

## 2019-12-17 NOTE — Telephone Encounter (Signed)
Pt called stating he is having issues with using his CPAP machine as it is giving him dry mouth. He has already contacted [employer] and is awaiting a response. He states he also notices that when he puts water in his machine and uses it all night the water level does not change and he is unaware is that is normal.

## 2019-12-18 DIAGNOSIS — G4733 Obstructive sleep apnea (adult) (pediatric): Secondary | ICD-10-CM | POA: Diagnosis not present

## 2019-12-18 NOTE — Telephone Encounter (Addendum)
Reached out to patient and he states the RT at [facility]) advised him to order a new water reservoir for his unit and to tighten his chin strap. Patient got a new mask and tightened the straps and there was no leaks on last night and no mouth dryness. Patient reports the water level in his reservoir had not been going down but now it is after the changes he made.

## 2019-12-22 DIAGNOSIS — Z23 Encounter for immunization: Secondary | ICD-10-CM | POA: Diagnosis not present

## 2019-12-29 ENCOUNTER — Ambulatory Visit (INDEPENDENT_AMBULATORY_CARE_PROVIDER_SITE_OTHER): Payer: Medicare PPO

## 2019-12-29 DIAGNOSIS — I5022 Chronic systolic (congestive) heart failure: Secondary | ICD-10-CM

## 2019-12-29 DIAGNOSIS — G4733 Obstructive sleep apnea (adult) (pediatric): Secondary | ICD-10-CM | POA: Diagnosis not present

## 2019-12-29 DIAGNOSIS — Z95810 Presence of automatic (implantable) cardiac defibrillator: Secondary | ICD-10-CM | POA: Diagnosis not present

## 2019-12-30 ENCOUNTER — Other Ambulatory Visit: Payer: Self-pay | Admitting: Internal Medicine

## 2019-12-30 ENCOUNTER — Telehealth: Payer: Self-pay

## 2019-12-30 NOTE — Telephone Encounter (Signed)
Remote ICM transmission received.  Attempted call to patient regarding ICM remote transmission and left detailed message per DPR.  Advised to return call for any fluid symptoms or questions. Next ICM remote transmission scheduled 01/30/2020.

## 2019-12-30 NOTE — Progress Notes (Signed)
EPIC Encounter for ICM Monitoring  Patient Name: Rickey Rice is a 79 y.o. male Date: 12/30/2019 Primary Care Physican: Lucianne Lei, MD Primary Cardiologist:[name] Electrophysiologist: Vergie Living Pacing: 99% 7/17/2021Weight:299lbs  Attempted call to patient and unable to reach.  Left detailed message per DPR regarding transmission. Transmission reviewed.   CorvueThoracic impedance normal.   Prescribed:Furosemide 40 mg 1 tablet daily.  Labs: 07/08/2019 Creatinine 1.49, BUN 21, Potassium 4.4, Sodium 141, GFR 44-51 05/27/2019 Creatinine 1.36, BUN 18, Potassium 4.5, Sodium 145, GFR 49-57 A complete set of results can be found in Results Review.  Recommendations:Left voice mail with ICM number and encouraged to call if experiencing any fluid symptoms.  Follow-up plan: ICM clinic phone appointment on11/07/2019. 91 day device clinic remote transmission11/06/2019.   EP/Cardiology Office Visits:Recalls for 01/31/2020 with Dr.[name] and 09/15/2020 with Dr [name].   Copy of ICM check sent to [place].   3 month ICM trend: 12/29/2019    1 Year ICM trend:       Rosalene Billings, RN 12/30/2019 9:55 AM

## 2020-01-01 ENCOUNTER — Other Ambulatory Visit: Payer: Self-pay | Admitting: Internal Medicine

## 2020-01-07 ENCOUNTER — Ambulatory Visit (INDEPENDENT_AMBULATORY_CARE_PROVIDER_SITE_OTHER): Payer: Medicare PPO | Admitting: Internal Medicine

## 2020-01-07 ENCOUNTER — Encounter: Payer: Self-pay | Admitting: Internal Medicine

## 2020-01-07 ENCOUNTER — Other Ambulatory Visit: Payer: Self-pay

## 2020-01-07 VITALS — BP 116/70 | HR 58 | Temp 96.8°F | Ht 71.5 in | Wt 312.0 lb

## 2020-01-07 DIAGNOSIS — I10 Essential (primary) hypertension: Secondary | ICD-10-CM | POA: Diagnosis not present

## 2020-01-07 DIAGNOSIS — Z95810 Presence of automatic (implantable) cardiac defibrillator: Secondary | ICD-10-CM | POA: Diagnosis not present

## 2020-01-07 DIAGNOSIS — I5022 Chronic systolic (congestive) heart failure: Secondary | ICD-10-CM

## 2020-01-07 DIAGNOSIS — I428 Other cardiomyopathies: Secondary | ICD-10-CM | POA: Diagnosis not present

## 2020-01-07 NOTE — Progress Notes (Signed)
OFFICE NOTE  Chief Complaint:  Follow-up heart failure  Primary Care Physician: Lucianne Lei, MD  HPI:  Rickey Rice  is a 79 year old gentleman with a history of diabetes, hypertension, hypothyroidism, and morbid obesity. In 2010 he had complaints of chest pain and underwent stress testing with [facility] which was a 2-day nuclear stress test and was apparently negative. Recently he underwent a colonoscopy and a preoperative EKG was abnormal. I unfortunately do not have that EKG to review, but I did review the EKG from your office on April 09, 2012 which showed a borderline intraventricular conduction delay, a sinus rhythm at 66, and poor R-wave progression. Today in the office he has an abnormal EKG demonstrating a left bundle branch block with a QRS duration of 168 msec, heart rate of 78 in sinus. His only symptoms are shortness of breath with exertion. He underwent evaluation of his left bundle branch block in February 2014. This is a 2 day nuclear stress test which was negative for ischemia. There was a small inferior and apical defect which could represent scar versus artifact. EF was mildly reduced at 49%.    Rickey Rice returns today for followup. He underwent knee replacement surgery as we had authorized him to do. He is tolerated surgery without any complications. He reports that he is recovering fairly well and is more mobile than he had been in the past. He started to do some exercises at the [place] and also this will translate into weight loss. Blood pressure has been stable he denies any chest pain.  He had blood work in January from his primary care provider which showed a total cholesterol of 128, HDL 51, triglycerides 53 and LDL 65. His hemoglobin A1c was 6.2.  Rickey Rice is now contemplating lethargy. Unfortunately has not been able to walk enough to lose weight in fact has gained some weight. 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 He feels like it's time to retire work on his generalized health. I would tend to agree with this.  I saw Rickey Rice back in the office today. He recently followed up from the hospital after having a sudden cardiac arrest. Unfortunately he was revived and is now status post AICD. He was to have CRT-D therapy, however there was difficulty with the LV lead, therefore he only has a single ventricle pacing with defibrillator functions. He is tolerating this well and had a small episode with fever postoperatively, but this was thought to be due to possibly a UTI. There is no evidence of pacemaker site fluctuance or swelling. Overall he feels he is doing well. He started exercising is managed to lose some weight. He is less short of breath and is more active.  Rickey Rice returns today for follow-up. Overall he is doing exceedingly well. He's managed to lose about 10 pounds. He is now below 300 pounds. His shortness of breath is improved some and he is ambulating better on his knees. He's had no device problems including no firings. He denies any chest pain or worsening shortness of breath and has not had any presyncopal or syncopal episodes.  Rickey Rice returns today for follow-up. Unfortunately he's gained some of his weight back. He denies a chest pain or worsening shortness of breath. He does not have any swelling. His last EF was as mentioned 40% in March 2016. EKG shows left bundle branch block. He scheduled to see Dr. [name] back for device  interrogation next month.  11/12/2015  Rickey Rice was seen today in follow-up. Unfortunately he was recently seen for some weight gain and fatigue by Rosaria Ferries, PA-C. She felt that he might be getting some extra fluid and recommended a short trial of increased Lasix. Weight is actually been fairly stable now at 300-301. Around that time he was found to have a UTI was treated for that and his symptoms improved. He was also noted to have  problems with infection and underwent multiple rounds of antibiotics. This led to problems with diarrhea or digestive problems and ultimately he went on probiotics. Recently, though he was talking on the phone and had an episode of transient global amnesia shortly after his wife left the house. He called her back and she came home and noted that he may have had an episode with his defibrillator. This was subsequently confirmed to be VT and he underwent a shock for that. He is seen Dr. [name] who increased his carvedilol to 12-1/2 mg twice a day. Blood pressure is actually a little low today but he reports being asymptomatic with that.  02/15/2016  Rickey Rice returns today for follow-up. He reports feeling well and denies worsening shortness of breath or chest pain. HE has not had any presyncopal spells. He just saw EP who noted his defibrillator was working properly. We again discussed strategies for heart failure management, including possibly switching lisinopril over to [facility]. He did not seem interested in this. It is noted his bp is higher today and that may at least allow uptitration of his lisinopril.  05/29/2016  Rickey Rice was seen today in follow-up. Weight is down slightly although has not made a significant improvement. He needs to start to do more exercise but he has blamed the weather and his arthritis. Blood pressure appears to be well-controlled and he brought readings in the office today. Although I do feel that there still potentially some room to consider adding Entresto. We discussed the medication today and it would effectively replace his lisinopril. He understands he would need to wash off the lisinopril for 36-48 hours before starting Entresto. We can provide him with samples and help him secure preauthorization.  12/28/2017  Rickey Rice is seen today in follow-up heart failure.  He reports NYHA class II symptoms, noting some shortness of breath with moderate exertion.  He has  recently had some weight loss.  He seems to be tolerating Entresto 24/26.  According to Dr. Olin Pia notes his LV lead is now activated.  He does appear to be biventricular pacing today.  Labs from July showed total cholesterol 143, HDL 52, LDL 79 triglycerides 42, hemoglobin A1c 5.8.  BNP in November was 155.  Recent remote check showed no change in thoracic impedance suggesting that he is compensated with his heart failure.  We previously discussed increasing the dose of Entresto as blood pressure would tolerate.  He is interested in getting a flu shot today.  11/13/2018  Rickey Rice is seen today back for follow-up of heart failure.  He continues to have NYHA class II symptoms.  He is struggling with weight.  Weight recently has gone up about 6 pounds.  He has been on the moderate dose of Entresto.  Unfortunately repeat echo earlier this year showed no significant improvement in LV function with his EF being 20 to 25%.  This is despite activation of his LV lead and biventricular pacing.  Remote check show stable thoracic impedance and no evidence of volume  overload.  Finally, he has been diagnosed with obstructive sleep apnea and will begin treatment shortly.  Hopefully this will be additionally helpful.  02/13/2019  Rickey Rice returns today for follow-up of his heart failure.  Overall he continues to have NYHA class II symptoms.  He is been able to do most activities without incident.  LVEF remains depressed.  There is been no evidence of volume overload recently on his biventricular pacemaker.  Remote checks are undergoing.  Weight is down a few pounds.  He is try to make some dietary changes.  I added Aldactone and he is now on highest dose Entresto.  Plan is to repeat an echo likely in a few months to see if there is been any interval improvement in LV function.  Also, he was diagnosed with obstructive sleep apnea but is not yet been fitted with a mask.  He said when he was treated with that during  the titration study, the next day he felt the best he is ever felt was able to do many activities and generally had a good energy level.  Hopefully based on his insurance plan he will be able to get equipment beginning in January.  01/07/2020  Rickey Rice seen today in follow-up of his heart failure.  He underwent an echo in February 2021 which does show a small improvement in LVEF up to 35 to 40%.  He continues to have remote defibrillator checks which have been fairly normal.  He denies any shocks.  He reports fairly stable fluid status although weight has gone up to about 312 pounds today from around 300 pounds in the past.  He reports less physical activity and needs to make more appropriate dietary changes.  Despite this blood pressure is well controlled today 116/70.  EKG shows a biventricular paced rhythm.  Lipid profile shows total cholesterol 124, HDL 46, LDL 65 and triglycerides of 47.  Hemoglobin A1c of 5.6.  PMHx:  Past Medical History:  Diagnosis Date  . AICD (automatic cardioverter/defibrillator) present   . Arthritis    oa  . Cardiac arrest ([place]) 06/07/2014   Primary VF arrest with successful resuscitation and s/p ICD implant  . Diabetes mellitus without complication ([place])    type 2  . Dyslipidemia   . History of nuclear stress test 04/2012   lexiscan - 2 day protocol; low risk study, evidence of inferrior & apical scar but no ischemia   . Hypertension   . Hypothyroidism   . LBBB (left bundle branch block)   . Morbid obesity ([place])   . NICM (nonischemic cardiomyopathy) ([place])   . Prostate enlargement    pt denies    Past Surgical History:  Procedure Laterality Date  . BI-VENTRICULAR IMPLANTABLE CARDIOVERTER DEFIBRILLATOR N/A 06/18/2014   STJ CRTD implanted by Dr [name]  . COLONOSCOPY W/ BIOPSIES    . COLONOSCOPY WITH PROPOFOL N/A 04/05/2017   Procedure: COLONOSCOPY WITH PROPOFOL;  Surgeon: Juanita Craver, MD;  Location: [facility];  Service: Endoscopy;  Laterality: N/A;   . [place]   1 st joint  right hand amputated  . KNEE ARTHROSCOPY Left   . LEFT HEART CATHETERIZATION WITH CORONARY ANGIOGRAM N/A 06/12/2014   Procedure: LEFT HEART CATHETERIZATION WITH CORONARY ANGIOGRAM;  Surgeon: Leonie Man, MD;  Location: [facility] CATH LAB;  Service: Cardiovascular;  Laterality: N/A;  . [place]  . TOTAL KNEE ARTHROPLASTY Right 02/05/2013   Procedure: TOTAL KNEE ARTHROPLASTY;  Surgeon: Kerin Salen, MD;  Location: [place];  Service: Orthopedics;  Laterality: Right;  . TOTAL KNEE ARTHROPLASTY Left 01/19/2014   Procedure: LEFT TOTAL KNEE ARTHROPLASTY;  Surgeon: Kerin Salen, MD;  Location: [place];  Service: Orthopedics;  Laterality: Left;    FAMHx:  Family History  Problem Relation Age of Onset  . Cancer Mother   . Kidney disease Brother   . Hypertension Sister     SOCHx:   reports that he has never smoked. He has never used smokeless tobacco. He reports current alcohol use. He reports that he does not use drugs.  ALLERGIES:  Allergies  Allergen Reactions  . Bee Venom Swelling    Swelling on lips and tongue  . Demerol [Meperidine]     Not sure  . Pioglitazone     Lips swelling= actos  . Shrimp [Shellfish Allergy]     rash  . Sulfa Antibiotics     Not sure    ROS: Pertinent items noted in HPI and remainder of comprehensive ROS otherwise negative.  HOME MEDS: Current Outpatient Medications  Medication Sig Dispense Refill  . acetaminophen (TYLENOL) 500 MG tablet Take 500 mg by mouth daily as needed for moderate pain or headache.    Marland Kitchen aspirin EC 81 MG tablet Take 81 mg by mouth daily.    . brimonidine (ALPHAGAN) 0.2 % ophthalmic solution Place 1 drop into the left eye 3 (three) times daily.    . carboxymethylcellulose (REFRESH PLUS) 0.5 % SOLN Place 1 drop into both eyes daily.     . carvedilol (COREG) 12.5 MG tablet TAKE 1 TABLET BY MOUTH TWICE DAILY WITH A MEAL 180 tablet 3  . ENTRESTO 97-103 MG TAKE 1 TABLET BY MOUTH TWICE DAILY  60 tablet 9  . furosemide (LASIX) 40 MG tablet TAKE 1 TABLET BY MOUTH EVERY DAY 90 tablet 3  . glimepiride (AMARYL) 4 MG tablet Take 8 mg by mouth daily before breakfast.     . glucose blood (ONE TOUCH ULTRA TEST) test strip USE AS DIRECTED TO CHECK BLOOD SUGAR BID    . levothyroxine (SYNTHROID, LEVOTHROID) 75 MCG tablet Take 75 mcg by mouth every evening. Monday through Saturday    . Multiple Vitamins-Minerals (ICAPS AREDS 2) CAPS Take 1 capsule by mouth 2 (two) times daily.    . Omega-3 Fatty Acids (OMEGA 3 PO) Take 1,600 mg by mouth every other day.    . rosuvastatin (CRESTOR) 10 MG tablet Take 1 tablet (10 mg total) by mouth daily at 6 PM. 30 tablet 0  . saxagliptin HCl (ONGLYZA) 5 MG TABS tablet Take 5 mg by mouth daily.    Marland Kitchen spironolactone (ALDACTONE) 25 MG tablet TAKE 1/2 TABLET(12.5 MG) BY MOUTH DAILY 45 tablet 3  . TURMERIC CURCUMIN PO Take 1 tablet by mouth every other day.     No current facility-administered medications for this visit.    LABS/IMAGING: No results found for this or any previous visit (from the past 48 hour(s)). No results found.  VITALS: BP 116/70 (BP Location: Left Arm, Patient Position: Sitting, Cuff Size: Large)   Pulse (!) 58   Temp (!) 96.8 F (36 C)   Ht 5' 11.5" (1.816 m)   Wt (!) 312 lb (141.5 kg)   BMI 42.91 kg/m   EXAM: General appearance: alert, no distress and morbidly obese Neck: no carotid bruit, no JVD and thyroid not enlarged, symmetric, no tenderness/mass/nodules Lungs: clear to auscultation bilaterally and AICD in left upper chest Heart: regular rate and rhythm, S1, S2  normal, no murmur, click, rub or gallop Abdomen: soft, non-tender; bowel sounds normal; no masses,  no organomegaly and Morbidly obese Extremities: extremities normal, atraumatic, no cyanosis or edema Pulses: 2+ and symmetric Skin: Skin color, texture, turgor normal. No rashes or lesions  EKG: Biventricular pacing at 58-personally reviewed  ASSESSMENT: 1. Status  post aborted sudden cardiac death-status post placement of a [employer] Bi-V AICD (LV lead is disabled due to dysfunction) 2. Recent VT/VF with appropriate AICD shock 3. Negative nuclear stress test for ischemia, fixed inferoapical defect suggesting possible scar versus artifact, EF 49% 4. Diabetes type 2 on oral medications - A1c 5.8 5. Hypertension-well controlled 6. Dyslipidemia on statin - well controlled 7. Non-ischemic cardiomyopathy-EF 20-25%, improved to 35 to 40% (04/2019) 8.   OSA - not currently on therapy  PLAN: 1.   Mr. Carbonell has had some interval improvement in LVEF up to 35 to 40%.  He has normally functioning [place].  He has had no recent shocks.  He has reasonably well-controlled diabetes and his dyslipidemia is well controlled.  Weight continues to be an issue and he needs to work on a diet and more physical activity.  Plan follow-up with me in 6 months or sooner as necessary.  Pixie Casino, MD, [facility], [place] Director of the [facility] Lipid Disorders &  Cardiovascular Risk Reduction Clinic Diplomate of the American Board of Clinical Lipidology Attending Cardiologist  Direct Dial: [phone]  Fax: [phone]  Website:  [email or web address] [email or web address] Patane 01/07/2020, 12:06 PM

## 2020-01-07 NOTE — Patient Instructions (Signed)

## 2020-01-19 ENCOUNTER — Other Ambulatory Visit: Payer: Self-pay

## 2020-01-19 ENCOUNTER — Encounter (INDEPENDENT_AMBULATORY_CARE_PROVIDER_SITE_OTHER): Payer: Medicare PPO | Admitting: Ophthalmology

## 2020-01-19 DIAGNOSIS — I10 Essential (primary) hypertension: Secondary | ICD-10-CM | POA: Diagnosis not present

## 2020-01-19 DIAGNOSIS — H43813 Vitreous degeneration, bilateral: Secondary | ICD-10-CM

## 2020-01-19 DIAGNOSIS — H35033 Hypertensive retinopathy, bilateral: Secondary | ICD-10-CM

## 2020-01-19 DIAGNOSIS — H33301 Unspecified retinal break, right eye: Secondary | ICD-10-CM

## 2020-01-22 DIAGNOSIS — E1169 Type 2 diabetes mellitus with other specified complication: Secondary | ICD-10-CM | POA: Diagnosis not present

## 2020-01-22 DIAGNOSIS — N189 Chronic kidney disease, unspecified: Secondary | ICD-10-CM | POA: Diagnosis not present

## 2020-01-22 DIAGNOSIS — E669 Obesity, unspecified: Secondary | ICD-10-CM | POA: Diagnosis not present

## 2020-01-22 DIAGNOSIS — M1991 Primary osteoarthritis, unspecified site: Secondary | ICD-10-CM | POA: Diagnosis not present

## 2020-01-22 DIAGNOSIS — I10 Essential (primary) hypertension: Secondary | ICD-10-CM | POA: Diagnosis not present

## 2020-01-29 ENCOUNTER — Ambulatory Visit: Payer: Medicare Other

## 2020-01-29 DIAGNOSIS — I428 Other cardiomyopathies: Secondary | ICD-10-CM

## 2020-01-29 DIAGNOSIS — G4733 Obstructive sleep apnea (adult) (pediatric): Secondary | ICD-10-CM | POA: Diagnosis not present

## 2020-02-01 LAB — CUP PACEART REMOTE DEVICE CHECK
Battery Remaining Longevity: 31 mo
Battery Remaining Percentage: 34 %
Battery Voltage: 2.93 V
Brady Statistic AP VP Percent: 1 %
Brady Statistic AP VS Percent: 1 %
Brady Statistic AS VP Percent: 99 %
Brady Statistic AS VS Percent: 1 %
Brady Statistic RA Percent Paced: 1 %
Date Time Interrogation Session: 20211106074155
HighPow Impedance: 60 Ohm
HighPow Impedance: 60 Ohm
Implantable Lead Implant Date: 20160324
Implantable Lead Implant Date: 20160324
Implantable Lead Implant Date: 20160324
Implantable Lead Location: 753858
Implantable Lead Location: 753859
Implantable Lead Location: 753860
Implantable Lead Model: 7122
Implantable Pulse Generator Implant Date: 20160324
Lead Channel Impedance Value: 390 Ohm
Lead Channel Impedance Value: 450 Ohm
Lead Channel Impedance Value: 540 Ohm
Lead Channel Pacing Threshold Amplitude: 1 V
Lead Channel Pacing Threshold Amplitude: 1 V
Lead Channel Pacing Threshold Amplitude: 1 V
Lead Channel Pacing Threshold Pulse Width: 0.5 ms
Lead Channel Pacing Threshold Pulse Width: 0.5 ms
Lead Channel Pacing Threshold Pulse Width: 0.5 ms
Lead Channel Sensing Intrinsic Amplitude: 12 mV
Lead Channel Sensing Intrinsic Amplitude: 2.6 mV
Lead Channel Setting Pacing Amplitude: 1.25 V
Lead Channel Setting Pacing Amplitude: 2 V
Lead Channel Setting Pacing Amplitude: 2.5 V
Lead Channel Setting Pacing Pulse Width: 0.5 ms
Lead Channel Setting Pacing Pulse Width: 0.5 ms
Lead Channel Setting Sensing Sensitivity: 0.5 mV
Pulse Gen Serial Number: 7240350

## 2020-02-02 NOTE — Progress Notes (Signed)
Remote ICD transmission.   

## 2020-02-03 ENCOUNTER — Ambulatory Visit (INDEPENDENT_AMBULATORY_CARE_PROVIDER_SITE_OTHER): Payer: Medicare PPO

## 2020-02-03 DIAGNOSIS — Z95810 Presence of automatic (implantable) cardiac defibrillator: Secondary | ICD-10-CM

## 2020-02-03 DIAGNOSIS — I5022 Chronic systolic (congestive) heart failure: Secondary | ICD-10-CM

## 2020-02-03 NOTE — Progress Notes (Signed)
EPIC Encounter for ICM Monitoring  Patient Name: Rickey Rice is a 79 y.o. male Date: 02/03/2020 Primary Care Physican: Lucianne Lei, MD Primary Cardiologist:[name] Electrophysiologist: Vergie Living Pacing: 99% 11/9/2021Weight:302lbs  Spoke with patient and reports feeling well at this time.  Denies fluid symptoms.    CorvueThoracic impedance normal.   Prescribed:Furosemide 40 mg 1 tablet daily.  Labs: 07/08/2019 Creatinine 1.49, BUN 21, Potassium 4.4, Sodium 141, GFR 44-51 05/27/2019 Creatinine 1.36, BUN 18, Potassium 4.5, Sodium 145, GFR 49-57 A complete set of results can be found in Results Review.  Recommendations:No changes and encouraged to call if experiencing any fluid symptoms.  Follow-up plan: ICM clinic phone appointment on12/14/2021. 91 day device clinic remote [id number].   EP/Cardiology Office Visits:Recalls for 07/05/2020 with Dr.[name] and 09/15/2020 with Dr [name].   Copy of ICM check sent to [place]   3 month ICM trend: 01/31/2020    1 Year ICM trend:       Rosalene Billings, RN 02/03/2020 11:42 AM

## 2020-02-18 DIAGNOSIS — G4733 Obstructive sleep apnea (adult) (pediatric): Secondary | ICD-10-CM | POA: Diagnosis not present

## 2020-02-28 DIAGNOSIS — G4733 Obstructive sleep apnea (adult) (pediatric): Secondary | ICD-10-CM | POA: Diagnosis not present

## 2020-03-09 ENCOUNTER — Ambulatory Visit (INDEPENDENT_AMBULATORY_CARE_PROVIDER_SITE_OTHER): Payer: Medicare PPO

## 2020-03-09 DIAGNOSIS — I5022 Chronic systolic (congestive) heart failure: Secondary | ICD-10-CM | POA: Diagnosis not present

## 2020-03-09 DIAGNOSIS — H401211 Low-tension glaucoma, right eye, mild stage: Secondary | ICD-10-CM | POA: Diagnosis not present

## 2020-03-09 DIAGNOSIS — H35033 Hypertensive retinopathy, bilateral: Secondary | ICD-10-CM | POA: Diagnosis not present

## 2020-03-09 DIAGNOSIS — H401222 Low-tension glaucoma, left eye, moderate stage: Secondary | ICD-10-CM | POA: Diagnosis not present

## 2020-03-09 DIAGNOSIS — H04123 Dry eye syndrome of bilateral lacrimal glands: Secondary | ICD-10-CM | POA: Diagnosis not present

## 2020-03-09 DIAGNOSIS — H353131 Nonexudative age-related macular degeneration, bilateral, early dry stage: Secondary | ICD-10-CM | POA: Diagnosis not present

## 2020-03-09 DIAGNOSIS — Z95810 Presence of automatic (implantable) cardiac defibrillator: Secondary | ICD-10-CM | POA: Diagnosis not present

## 2020-03-09 DIAGNOSIS — H0102B Squamous blepharitis left eye, upper and lower eyelids: Secondary | ICD-10-CM | POA: Diagnosis not present

## 2020-03-12 ENCOUNTER — Telehealth: Payer: Self-pay

## 2020-03-12 NOTE — Progress Notes (Signed)
EPIC Encounter for ICM Monitoring  Patient Name: JOAB CARDEN is a 79 y.o. male Date: 03/12/2020 Primary Care Physican: Lucianne Lei, MD Primary Cardiologist:[name] Electrophysiologist: Vergie Living Pacing: 99% 11/9/2021Weight:302lbs  Attempted call to patient and unable to reach.  Left detailed message per DPR regarding transmission. Transmission reviewed.   CorvueThoracic impedance normal.   Prescribed:Furosemide 40 mg 1 tablet daily.  Labs: 07/08/2019 Creatinine 1.49, BUN 21, Potassium 4.4, Sodium 141, GFR 44-51 05/27/2019 Creatinine 1.36, BUN 18, Potassium 4.5, Sodium 145, GFR 49-57 A complete set of results can be found in Results Review.  Recommendations:Left voice mail with ICM number and encouraged to call if experiencing any fluid symptoms.  Follow-up plan: ICM clinic phone appointment on2/06/2020. 91 day device clinic remote transmission2/05/2020.   EP/Cardiology Office Visits:Recalls for 07/05/2020 with Dr.[name] and 09/15/2020 with Dr [name].   Copy of ICM check sent to [place]   3 month ICM trend: 03/09/2020    1 Year ICM trend:       Rosalene Billings, RN 03/12/2020 1:44 PM

## 2020-03-12 NOTE — Telephone Encounter (Signed)
Remote ICM transmission received.  Attempted call to patient regarding ICM remote transmission and left detailed message per DPR.  Advised to return call for any fluid symptoms or questions. Next ICM remote transmission scheduled 04/30/2020.

## 2020-03-30 DIAGNOSIS — G4733 Obstructive sleep apnea (adult) (pediatric): Secondary | ICD-10-CM | POA: Diagnosis not present

## 2020-04-22 DIAGNOSIS — I110 Hypertensive heart disease with heart failure: Secondary | ICD-10-CM | POA: Diagnosis not present

## 2020-04-22 DIAGNOSIS — I150 Renovascular hypertension: Secondary | ICD-10-CM | POA: Diagnosis not present

## 2020-04-22 DIAGNOSIS — E1169 Type 2 diabetes mellitus with other specified complication: Secondary | ICD-10-CM | POA: Diagnosis not present

## 2020-04-29 ENCOUNTER — Ambulatory Visit (INDEPENDENT_AMBULATORY_CARE_PROVIDER_SITE_OTHER): Payer: Medicare PPO

## 2020-04-29 DIAGNOSIS — I5022 Chronic systolic (congestive) heart failure: Secondary | ICD-10-CM | POA: Diagnosis not present

## 2020-04-29 DIAGNOSIS — I428 Other cardiomyopathies: Secondary | ICD-10-CM

## 2020-04-29 LAB — CUP PACEART REMOTE DEVICE CHECK
Battery Remaining Longevity: 29 mo
Battery Remaining Percentage: 31 %
Battery Voltage: 2.93 V
Brady Statistic AP VP Percent: 1 %
Brady Statistic AP VS Percent: 1 %
Brady Statistic AS VP Percent: 98 %
Brady Statistic AS VS Percent: 1 %
Brady Statistic RA Percent Paced: 1 %
Date Time Interrogation Session: 20220203065925
HighPow Impedance: 71 Ohm
HighPow Impedance: 71 Ohm
Implantable Lead Implant Date: 20160324
Implantable Lead Implant Date: 20160324
Implantable Lead Implant Date: 20160324
Implantable Lead Location: 753858
Implantable Lead Location: 753859
Implantable Lead Location: 753860
Implantable Lead Model: 7122
Implantable Pulse Generator Implant Date: 20160324
Lead Channel Impedance Value: 380 Ohm
Lead Channel Impedance Value: 450 Ohm
Lead Channel Impedance Value: 510 Ohm
Lead Channel Pacing Threshold Amplitude: 1 V
Lead Channel Pacing Threshold Amplitude: 1 V
Lead Channel Pacing Threshold Amplitude: 1 V
Lead Channel Pacing Threshold Pulse Width: 0.5 ms
Lead Channel Pacing Threshold Pulse Width: 0.5 ms
Lead Channel Pacing Threshold Pulse Width: 0.5 ms
Lead Channel Sensing Intrinsic Amplitude: 12 mV
Lead Channel Sensing Intrinsic Amplitude: 2.6 mV
Lead Channel Setting Pacing Amplitude: 1.25 V
Lead Channel Setting Pacing Amplitude: 2 V
Lead Channel Setting Pacing Amplitude: 2.5 V
Lead Channel Setting Pacing Pulse Width: 0.5 ms
Lead Channel Setting Pacing Pulse Width: 0.5 ms
Lead Channel Setting Sensing Sensitivity: 0.5 mV
Pulse Gen Serial Number: 7240350

## 2020-04-30 ENCOUNTER — Ambulatory Visit (INDEPENDENT_AMBULATORY_CARE_PROVIDER_SITE_OTHER): Payer: Medicare PPO

## 2020-04-30 DIAGNOSIS — G4733 Obstructive sleep apnea (adult) (pediatric): Secondary | ICD-10-CM | POA: Diagnosis not present

## 2020-04-30 DIAGNOSIS — I5022 Chronic systolic (congestive) heart failure: Secondary | ICD-10-CM | POA: Diagnosis not present

## 2020-04-30 DIAGNOSIS — Z95810 Presence of automatic (implantable) cardiac defibrillator: Secondary | ICD-10-CM

## 2020-05-04 NOTE — Progress Notes (Signed)
EPIC Encounter for ICM Monitoring  Patient Name: Rickey Rice is a 80 y.o. male Date: 05/04/2020 Primary Care Physican: Lucianne Lei, MD Primary Cardiologist:[name] Electrophysiologist: Vergie Living Pacing: 99% 2/8/2022Weight:312lbs  Spoke with patient and reports feeling well at this time.  Denies fluid symptoms.  He has some leg swelling that occurs during the day but resolves at night.  He wears compression.   CorvueThoracic impedance normal.   Prescribed:Furosemide 40 mg 1 tablet daily.  Labs: 07/08/2019 Creatinine 1.49, BUN 21, Potassium 4.4, Sodium 141, GFR 44-51 05/27/2019 Creatinine 1.36, BUN 18, Potassium 4.5, Sodium 145, GFR 49-57 A complete set of results can be found in Results Review.  Recommendations:  No changes and encouraged to call if experiencing any fluid symptoms.  Follow-up plan: ICM clinic phone appointment on3/09/2020. 91 day device clinic remote transmission5/07/2020.   EP/Cardiology Office Visits:Recalls for4/11/2022with Dr.[name] and 09/15/2020 with Dr [name].   Copy of ICM check sent to [place]   3 month ICM trend: 04/30/2020.    1 Year ICM trend:       Rosalene Billings, RN 05/04/2020 9:16 AM

## 2020-05-05 NOTE — Progress Notes (Signed)
Remote ICD transmission.   

## 2020-05-06 DIAGNOSIS — E1169 Type 2 diabetes mellitus with other specified complication: Secondary | ICD-10-CM | POA: Diagnosis not present

## 2020-05-06 DIAGNOSIS — I480 Paroxysmal atrial fibrillation: Secondary | ICD-10-CM | POA: Diagnosis not present

## 2020-05-06 DIAGNOSIS — M13861 Other specified arthritis, right knee: Secondary | ICD-10-CM | POA: Diagnosis not present

## 2020-05-06 DIAGNOSIS — E119 Type 2 diabetes mellitus without complications: Secondary | ICD-10-CM | POA: Diagnosis not present

## 2020-05-06 DIAGNOSIS — Z0000 Encounter for general adult medical examination without abnormal findings: Secondary | ICD-10-CM | POA: Diagnosis not present

## 2020-05-06 DIAGNOSIS — I10 Essential (primary) hypertension: Secondary | ICD-10-CM | POA: Diagnosis not present

## 2020-05-25 DIAGNOSIS — G4733 Obstructive sleep apnea (adult) (pediatric): Secondary | ICD-10-CM | POA: Diagnosis not present

## 2020-05-28 DIAGNOSIS — G4733 Obstructive sleep apnea (adult) (pediatric): Secondary | ICD-10-CM | POA: Diagnosis not present

## 2020-05-31 ENCOUNTER — Ambulatory Visit (INDEPENDENT_AMBULATORY_CARE_PROVIDER_SITE_OTHER): Payer: Medicare PPO

## 2020-05-31 DIAGNOSIS — Z95810 Presence of automatic (implantable) cardiac defibrillator: Secondary | ICD-10-CM | POA: Diagnosis not present

## 2020-05-31 DIAGNOSIS — I5022 Chronic systolic (congestive) heart failure: Secondary | ICD-10-CM

## 2020-06-01 NOTE — Progress Notes (Signed)
EPIC Encounter for ICM Monitoring  Patient Name: Rickey Rice is a 80 y.o. male Date: 06/01/2020 Primary Care Physican: Lucianne Lei, MD Primary Cardiologist:[name] Electrophysiologist: Vergie Living Pacing: 98% 2/8/2022Weight:312lbs  Spoke with patient and reports feeling well at this time.  Denies fluid symptoms.    CorvueThoracic impedance normal.   Prescribed:Furosemide 40 mg 1 tablet daily.  Labs: 07/08/2019 Creatinine 1.49, BUN 21, Potassium 4.4, Sodium 141, GFR 44-51 05/27/2019 Creatinine 1.36, BUN 18, Potassium 4.5, Sodium 145, GFR 49-57 A complete set of results can be found in Results Review.  Recommendations: No changes and encouraged to call if experiencing any fluid symptoms..  Follow-up plan: ICM clinic phone appointment on4/01/2021. 91 day device clinic remote transmission5/07/2020.   EP/Cardiology Office Visits:Recalls for4/11/2022with Dr.[name] and 09/15/2020 with Dr [name].   Copy of ICM check sent to [place]    3 month ICM trend: 06/01/2020.    1 Year ICM trend:       Rosalene Billings, RN 06/01/2020 7:53 AM

## 2020-06-03 DIAGNOSIS — E669 Obesity, unspecified: Secondary | ICD-10-CM | POA: Diagnosis not present

## 2020-06-03 DIAGNOSIS — I10 Essential (primary) hypertension: Secondary | ICD-10-CM | POA: Diagnosis not present

## 2020-06-03 DIAGNOSIS — E1165 Type 2 diabetes mellitus with hyperglycemia: Secondary | ICD-10-CM | POA: Diagnosis not present

## 2020-06-03 DIAGNOSIS — E1169 Type 2 diabetes mellitus with other specified complication: Secondary | ICD-10-CM | POA: Diagnosis not present

## 2020-06-08 DIAGNOSIS — H401222 Low-tension glaucoma, left eye, moderate stage: Secondary | ICD-10-CM | POA: Diagnosis not present

## 2020-06-08 DIAGNOSIS — H401211 Low-tension glaucoma, right eye, mild stage: Secondary | ICD-10-CM | POA: Diagnosis not present

## 2020-06-08 DIAGNOSIS — H04123 Dry eye syndrome of bilateral lacrimal glands: Secondary | ICD-10-CM | POA: Diagnosis not present

## 2020-06-08 DIAGNOSIS — Z961 Presence of intraocular lens: Secondary | ICD-10-CM | POA: Diagnosis not present

## 2020-06-27 ENCOUNTER — Other Ambulatory Visit: Payer: Self-pay | Admitting: Cardiology

## 2020-07-05 ENCOUNTER — Ambulatory Visit (INDEPENDENT_AMBULATORY_CARE_PROVIDER_SITE_OTHER): Payer: Medicare PPO

## 2020-07-05 DIAGNOSIS — Z95810 Presence of automatic (implantable) cardiac defibrillator: Secondary | ICD-10-CM

## 2020-07-05 DIAGNOSIS — I5022 Chronic systolic (congestive) heart failure: Secondary | ICD-10-CM

## 2020-07-05 NOTE — Progress Notes (Signed)
EPIC Encounter for ICM Monitoring  Patient Name: Rickey Rice is a 80 y.o. male Date: 07/05/2020 Primary Care Physican: Lucianne Lei, MD Primary Cardiologist:[name] Electrophysiologist: Vergie Living Pacing: 98% 2/8/2022Weight:312lbs  Spoke with patient and reports feeling well at this time.  Denies fluid symptoms.  He was out of town this past weekend due to death in the family and was eating differently for a few days.  CorvueThoracic impedance suggesting possible fluid accumulation starting 4/8 but returned close to baseline normal.   Prescribed:Furosemide 40 mg 1 tablet daily.  Labs: 07/08/2019 Creatinine 1.49, BUN 21, Potassium 4.4, Sodium 141, GFR 44-51 05/27/2019 Creatinine 1.36, BUN 18, Potassium 4.5, Sodium 145, GFR 49-57 A complete set of results can be found in Results Review.  Recommendations: Encouraged to limit salt intake.  No changes and encouraged to call if experiencing any fluid symptoms..  Follow-up plan: ICM clinic phone appointment on5/24/2022. 91 day device clinic remote transmission5/07/2020.   EP/Cardiology Office Visits:Recalls for4/11/2022with Dr.[name].   09/13/2020 with Dr [name].   Copy of ICM check sent to [place]  3 month ICM trend: 07/05/2020.    1 Year ICM trend:       Rosalene Billings, RN 07/05/2020 12:16 PM

## 2020-07-29 ENCOUNTER — Ambulatory Visit (INDEPENDENT_AMBULATORY_CARE_PROVIDER_SITE_OTHER): Payer: Medicare PPO

## 2020-07-29 DIAGNOSIS — I5022 Chronic systolic (congestive) heart failure: Secondary | ICD-10-CM | POA: Diagnosis not present

## 2020-07-29 DIAGNOSIS — I428 Other cardiomyopathies: Secondary | ICD-10-CM

## 2020-07-29 LAB — CUP PACEART REMOTE DEVICE CHECK
Battery Remaining Longevity: 25 mo
Battery Remaining Percentage: 28 %
Battery Voltage: 2.92 V
Brady Statistic AP VP Percent: 1 %
Brady Statistic AP VS Percent: 1 %
Brady Statistic AS VP Percent: 98 %
Brady Statistic AS VS Percent: 1 %
Brady Statistic RA Percent Paced: 1 %
Date Time Interrogation Session: 20220505022807
HighPow Impedance: 69 Ohm
HighPow Impedance: 69 Ohm
Implantable Lead Implant Date: 20160324
Implantable Lead Implant Date: 20160324
Implantable Lead Implant Date: 20160324
Implantable Lead Location: 753858
Implantable Lead Location: 753859
Implantable Lead Location: 753860
Implantable Lead Model: 7122
Implantable Pulse Generator Implant Date: 20160324
Lead Channel Impedance Value: 380 Ohm
Lead Channel Impedance Value: 450 Ohm
Lead Channel Impedance Value: 530 Ohm
Lead Channel Pacing Threshold Amplitude: 1 V
Lead Channel Pacing Threshold Amplitude: 1 V
Lead Channel Pacing Threshold Amplitude: 1 V
Lead Channel Pacing Threshold Pulse Width: 0.5 ms
Lead Channel Pacing Threshold Pulse Width: 0.5 ms
Lead Channel Pacing Threshold Pulse Width: 0.5 ms
Lead Channel Sensing Intrinsic Amplitude: 12 mV
Lead Channel Sensing Intrinsic Amplitude: 3.2 mV
Lead Channel Setting Pacing Amplitude: 1.25 V
Lead Channel Setting Pacing Amplitude: 2 V
Lead Channel Setting Pacing Amplitude: 2.5 V
Lead Channel Setting Pacing Pulse Width: 0.5 ms
Lead Channel Setting Pacing Pulse Width: 0.5 ms
Lead Channel Setting Sensing Sensitivity: 0.5 mV
Pulse Gen Serial Number: 7240350

## 2020-08-12 DIAGNOSIS — F338 Other recurrent depressive disorders: Secondary | ICD-10-CM | POA: Diagnosis not present

## 2020-08-12 DIAGNOSIS — M13861 Other specified arthritis, right knee: Secondary | ICD-10-CM | POA: Diagnosis not present

## 2020-08-12 DIAGNOSIS — I10 Essential (primary) hypertension: Secondary | ICD-10-CM | POA: Diagnosis not present

## 2020-08-12 DIAGNOSIS — N189 Chronic kidney disease, unspecified: Secondary | ICD-10-CM | POA: Diagnosis not present

## 2020-08-12 DIAGNOSIS — E1169 Type 2 diabetes mellitus with other specified complication: Secondary | ICD-10-CM | POA: Diagnosis not present

## 2020-08-12 DIAGNOSIS — E039 Hypothyroidism, unspecified: Secondary | ICD-10-CM | POA: Diagnosis not present

## 2020-08-16 ENCOUNTER — Ambulatory Visit (INDEPENDENT_AMBULATORY_CARE_PROVIDER_SITE_OTHER): Payer: Medicare PPO

## 2020-08-16 DIAGNOSIS — I5022 Chronic systolic (congestive) heart failure: Secondary | ICD-10-CM | POA: Diagnosis not present

## 2020-08-16 DIAGNOSIS — Z95810 Presence of automatic (implantable) cardiac defibrillator: Secondary | ICD-10-CM | POA: Diagnosis not present

## 2020-08-16 NOTE — Progress Notes (Signed)
EPIC Encounter for ICM Monitoring  Patient Name: Rickey Rice is a 80 y.o. male Date: 08/16/2020 Primary Care Physican: Lucianne Lei, MD Primary Cardiologist:[name] Electrophysiologist: Vergie Living Pacing: 98% 5/23/2022Office Weight:317lbs  Spoke with patient and reports feeling well at this time. Denies fluid symptoms. He reports he has been drinking too much fluid.  CorvueThoracic impedance suggesting possible fluid accumulation starting 08/13/2020.   Prescribed:Furosemide 40 mg 1 tablet daily.  Labs: 07/08/2019 Creatinine 1.49, BUN 21, Potassium 4.4, Sodium 141, GFR 44-51 05/27/2019 Creatinine 1.36, BUN 18, Potassium 4.5, Sodium 145, GFR 49-57 A complete set of results can be found in Results Review.  Recommendations: Recommendation to limit salt intake to 2000 mg daily and fluid intake to 64 oz daily.  Encouraged to call if experiencing any fluid symptoms.   Follow-up plan: ICM clinic phone appointment on5/31/2022 to recheck fluid levels. 91 day device clinic remote transmission8/06/2020.   EP/Cardiology Office Visits:Recalls for4/11/2022with Dr.[name].   09/13/2020 with Dr [name].   Copy of ICM check sent to Dr.Klein and Dr Debara Pickett for review and recommendations if needed.  3 month ICM trend: 08/16/2020.    1 Year ICM trend:       Rosalene Billings, RN 08/16/2020 4:39 PM

## 2020-08-17 ENCOUNTER — Other Ambulatory Visit (HOSPITAL_COMMUNITY)
Admission: RE | Admit: 2020-08-17 | Discharge: 2020-08-17 | Disposition: A | Discharge status: Home / Self Care | Payer: Medicare PPO | Source: Ambulatory Visit | Attending: Family Medicine | Admitting: Family Medicine

## 2020-08-17 DIAGNOSIS — S30861A Insect bite (nonvenomous) of abdominal wall, initial encounter: Secondary | ICD-10-CM | POA: Diagnosis not present

## 2020-08-17 DIAGNOSIS — S30851A Superficial foreign body of abdominal wall, initial encounter: Secondary | ICD-10-CM | POA: Diagnosis not present

## 2020-08-17 DIAGNOSIS — W57XXXA Bitten or stung by nonvenomous insect and other nonvenomous arthropods, initial encounter: Secondary | ICD-10-CM | POA: Diagnosis not present

## 2020-08-18 NOTE — Progress Notes (Signed)
Remote pacemaker transmission.   

## 2020-08-19 LAB — SURGICAL PATHOLOGY

## 2020-08-24 ENCOUNTER — Ambulatory Visit (INDEPENDENT_AMBULATORY_CARE_PROVIDER_SITE_OTHER): Payer: Medicare PPO

## 2020-08-24 DIAGNOSIS — Z95810 Presence of automatic (implantable) cardiac defibrillator: Secondary | ICD-10-CM

## 2020-08-24 DIAGNOSIS — I5022 Chronic systolic (congestive) heart failure: Secondary | ICD-10-CM

## 2020-08-24 DIAGNOSIS — I10 Essential (primary) hypertension: Secondary | ICD-10-CM | POA: Diagnosis not present

## 2020-08-24 DIAGNOSIS — W57XXXD Bitten or stung by nonvenomous insect and other nonvenomous arthropods, subsequent encounter: Secondary | ICD-10-CM | POA: Diagnosis not present

## 2020-08-24 NOTE — Progress Notes (Signed)
EPIC Encounter for ICM Monitoring  Patient Name: Rickey Rice is a 80 y.o. male Date: 08/24/2020 Primary Care Physican: Lucianne Lei, MD Primary Cardiologist:[name] Electrophysiologist: Vergie Living Pacing: 98% 5/23/2022Office Weight:317lbs 08/24/2020: Office Weight 316 lbs  Spoke with patient.  Dr Criss Rosales ordered patient to take extra 20 mg Furosemide x 1 day due to swollen legs which may correlate with impedance dryness.  Leg swelling has improved.    CorvueThoracic impedancesuggesting a switched from possible fluid accumulation to possible dryness starting 08/18/2020.   Prescribed:Furosemide 40 mg 1 tablet daily.  Labs: 07/08/2019 Creatinine 1.49, BUN 21, Potassium 4.4, Sodium 141, GFR 44-51 05/27/2019 Creatinine 1.36, BUN 18, Potassium 4.5, Sodium 145, GFR 49-57 A complete set of results can be found in Results Review.  Recommendations: Advised to drink 64 oz fluid daily to stay hydrated.No changes and encouraged to call if experiencing any fluid symptoms.  Follow-up plan: ICM clinic phone appointment on6/27/2022. 91 day device clinic remote transmission8/06/2020.   EP/Cardiology Office Visits:Recalls for4/11/2022with Dr.[name].09/13/2020 with Dr [name].   Copy of ICM check sent to [place]   3 month ICM trend: 08/24/2020.    1 Year ICM trend:       Rosalene Billings, RN 08/24/2020 12:56 PM

## 2020-08-31 DIAGNOSIS — I10 Essential (primary) hypertension: Secondary | ICD-10-CM | POA: Diagnosis not present

## 2020-09-01 ENCOUNTER — Encounter (HOSPITAL_COMMUNITY): Payer: Self-pay | Admitting: Physical Therapy

## 2020-09-01 ENCOUNTER — Ambulatory Visit (HOSPITAL_COMMUNITY): Payer: Medicare PPO | Attending: Family Medicine | Admitting: Physical Therapy

## 2020-09-01 ENCOUNTER — Other Ambulatory Visit: Payer: Self-pay

## 2020-09-01 DIAGNOSIS — R2689 Other abnormalities of gait and mobility: Secondary | ICD-10-CM | POA: Diagnosis not present

## 2020-09-01 DIAGNOSIS — R29898 Other symptoms and signs involving the musculoskeletal system: Secondary | ICD-10-CM

## 2020-09-01 NOTE — Patient Instructions (Signed)
Access Code: [email or web address] URL: [email or web address] Date: 09/01/2020 Prepared by: Mitzi Hansen Rickey Rice  Exercises Sit to Stand with Arms Crossed - 2 x daily - 7 x weekly - 2 sets - 10 reps

## 2020-09-01 NOTE — Therapy (Signed)
[place], [place], [address] Phone: [phone]   Fax:  [phone]  Physical Therapy Evaluation  Patient Details  Name: Rickey Rice MRN: [id number] Date of Birth: [birth year] Referring Provider (PT): Lucianne Lei MD   Encounter Date: 09/01/2020   PT End of Session - 09/01/20 1057    Visit Number 1    Number of Visits 8    Date for PT Re-Evaluation 09/29/20    Authorization Type [employer]    Authorization Time Period 8visits requested 6/8-7/6    Authorization - Visit Number 1    Authorization - Number of Visits 8    PT Start Time 7341   arrives late/delayed check in   PT Stop Time 1125    PT Time Calculation (min) 28 min    Activity Tolerance Patient tolerated treatment well    Behavior During Therapy [facility] for tasks assessed/performed           Past Medical History:  Diagnosis Date  . AICD (automatic cardioverter/defibrillator) present   . Arthritis    oa  . Cardiac arrest ([place]) 06/07/2014   Primary VF arrest with successful resuscitation and s/p ICD implant  . Diabetes mellitus without complication ([place])    type 2  . Dyslipidemia   . History of nuclear stress test 04/2012   lexiscan - 2 day protocol; low risk study, evidence of inferrior & apical scar but no ischemia   . Hypertension   . Hypothyroidism   . LBBB (left bundle branch block)   . Morbid obesity ([place])   . NICM (nonischemic cardiomyopathy) ([place])   . Prostate enlargement    pt denies    Past Surgical History:  Procedure Laterality Date  . BI-VENTRICULAR IMPLANTABLE CARDIOVERTER DEFIBRILLATOR N/A 06/18/2014   STJ CRTD implanted by Dr [name]  . COLONOSCOPY W/ BIOPSIES    . COLONOSCOPY WITH PROPOFOL N/A 04/05/2017   Procedure: COLONOSCOPY WITH PROPOFOL;  Surgeon: Juanita Craver, MD;  Location: [facility];  Service: Endoscopy;  Laterality: N/A;  . [place]   1 st joint  right hand amputated  . KNEE ARTHROSCOPY Left    . LEFT HEART CATHETERIZATION WITH CORONARY ANGIOGRAM N/A 06/12/2014   Procedure: LEFT HEART CATHETERIZATION WITH CORONARY ANGIOGRAM;  Surgeon: Leonie Man, MD;  Location: [facility] C[facility] LAB;  Service: Cardiovascular;  Laterality: N/A;  . [place]  . TOTAL KNEE ARTHROPLASTY Right 02/05/2013   Procedure: TOTAL KNEE ARTHROPLASTY;  Surgeon: Kerin Salen, MD;  Location: [facility];  Service: Orthopedics;  Laterality: Right;  . TOTAL KNEE ARTHROPLASTY Left 01/19/2014   Procedure: LEFT TOTAL KNEE ARTHROPLASTY;  Surgeon: Kerin Salen, MD;  Location: [facility];  Service: Orthopedics;  Laterality: Left;    There were no vitals filed for this visit.    Subjective Assessment - 09/01/20 1058    Subjective Patient is a 80 y.o. male who presents to physical therapy with c/o R leg pain. Patient states pain in both legs on the outsides. He was using a petal machine and it hurt his legs. Patient states pain began late last year in Nov./December 2021. He uses some ointment which has helped. He states increase in pain with laying at night. He gets tired out easily. His main goal is to strengthen legs to get back to walking.    Limitations House hold activities;Walking    How long can you walk comfortably? 200 feet    Patient Stated Goals  improve strength and walking    Currently in Pain? No/denies              [facility] PT Assessment - 09/01/20 0001      Assessment   Medical Diagnosis Pain in R Leg    Referring Provider (PT) Lucianne Lei MD    Onset Date/Surgical Date 02/25/20    Next MD Visit June 23      Precautions   Precautions None      Restrictions   Weight Bearing Restrictions No      Balance Screen   Has the patient fallen in the past 6 months No    Has the patient had a decrease in activity level because of a fear of falling?  No    Is the patient reluctant to leave their home because of a fear of falling?  No      Prior Function   Level of Independence Independent    Vocation Retired       Charity fundraiser Status Within Functional Limits for tasks assessed      Observation/Other Assessments   Observations Ambulates with SPC    Focus on Therapeutic Outcomes (FOTO)  n/a      ROM / Strength   AROM / PROM / Strength AROM;Strength      AROM   Overall AROM  Within functional limits for tasks performed      Strength   Strength Assessment Site Hip;Knee;Ankle    Right/Left Hip Right;Left    Right Hip Flexion 4+/5    Left Hip Flexion 4+/5    Right/Left Knee Right;Left    Right Knee Flexion 5/5    Right Knee Extension 5/5    Left Knee Flexion 5/5    Left Knee Extension 5/5    Right/Left Ankle Right;Left    Right Ankle Dorsiflexion 5/5    Left Ankle Dorsiflexion 5/5      Transfers   Five time sit to stand comments  25.88 seconds with use of hands      Ambulation/Gait   Ambulation/Gait Yes    Ambulation/Gait Assistance 6: Modified independent (Device/Increase time)    Ambulation Distance (Feet) 255 Feet    Assistive device Straight cane    Gait Pattern Trunk flexed    Ambulation Surface Level;Indoor    Gait Comments 2 MWT                      Objective measurements completed on examination: See above findings.       [place] Adult PT Treatment/Exercise - 09/01/20 0001      Exercises   Exercises Knee/Hip      Knee/Hip Exercises: Seated   Sit to Sand 1 set;10 reps;with UE support                  PT Education - 09/01/20 1056    Education Details Patient educated on exam findings, POC, scope of PT, HEP    Person(s) Educated Patient    Methods Explanation;Handout;Demonstration    Comprehension Verbalized understanding;Returned demonstration            PT Short Term Goals - 09/01/20 1129      PT SHORT TERM GOAL #1   Title Patient will be independent with HEP in order to improve functional outcomes.    Time 2    Period Weeks    Status New    Target Date 09/15/20      PT SHORT TERM GOAL #  2   Title Patient will  report at least 25% improvement in symptoms for improved quality of life.    Time 2    Period Weeks    Status New    Target Date 09/15/20             PT Long Term Goals - 09/01/20 1130      PT LONG TERM GOAL #1   Title Patient will report at least 75% improvement in symptoms for improved quality of life.    Time 4    Period Weeks    Status New    Target Date 09/29/20      PT LONG TERM GOAL #2   Title Patient will be able to ambulate at least 350 feet in 2MWT in order to demonstrate improved gait speed for community ambulation.    Time 4    Period Weeks    Status New    Target Date 09/29/20      PT LONG TERM GOAL #3   Title Patient will be able to complete 5x STS in under 15 seconds in order to reduce the risk of falls.    Time 4    Period Weeks    Status New    Target Date 09/29/20                  Plan - 09/01/20 1128    Clinical Impression Statement Patient is a 80 y.o. male who presents to physical therapy with c/o bilateral leg pain and weakness. He presents with pain limited deficits in bilateral LE strength, ROM, endurance, postural impairments, spinal mobility and functional mobility with ADL. He is having to modify and restrict ADL as indicated subjective information and objective measures which is affecting overall participation. Patient will benefit from skilled physical therapy in order to improve function and reduce impairment.    Personal Factors and Comorbidities Comorbidity 3+;Age;Social Background;Time since onset of injury/illness/exacerbation;Fitness    Comorbidities obesity, HTN, DM    Examination-Activity Limitations Stairs;Stand;Transfers;Squat;Locomotion Level;Lift    Examination-Participation Restrictions Lawyer;Shop;Community Activity;Cleaning;Meal Prep    Clinical Decision Making Low    Rehab Potential Good    PT Frequency 2x / week    PT Duration 4 weeks    PT Treatment/Interventions ADLs/Self Care Home Management;Aquatic  Therapy;Cryotherapy;Electrical Stimulation;Iontophoresis 4mg /ml Dexamethasone;Moist Heat;Traction;Ultrasound;Parrafin;DME Instruction;Gait training;Stair training;Functional mobility training;Therapeutic activities;Therapeutic exercise;Balance training;Neuromuscular re-education;Patient/family education;Orthotic Fit/Training;Manual techniques;Passive range of motion;Dry needling;Energy conservation;Splinting;Taping;Spinal Manipulations;Joint Manipulations    PT Next Visit Plan continue functional strengthening, standing and table exercises, endurance training with walking/nu step, progress as able    PT Home Exercise Plan 6/8 STS    Consulted and Agree with Plan of Care Patient           Patient will benefit from skilled therapeutic intervention in order to improve the following deficits and impairments:  Abnormal gait,Decreased activity tolerance,Difficulty walking,Decreased strength,Pain,Obesity,Decreased mobility,Decreased balance,Improper body mechanics  Visit Diagnosis: Other abnormalities of gait and mobility  Other symptoms and signs involving the musculoskeletal system     Problem List Patient Active Problem List   Diagnosis Date Noted  . OSA (obstructive sleep apnea) 12/19/2018  . Salivary stone 02/01/2018  . Impacted cerumen of both ears 05/16/2017  . Sensorineural hearing loss (SNHL), bilateral 05/16/2017  . Encounter for immunization 02/15/2016  . VT (ventricular tachycardia) ([place]) 11/12/2015  . Chronic systolic congestive heart failure ([place]) 09/24/2015  . S/P implantation of automatic cardioverter/defibrillator (AICD) 07/26/2014  . CKD (chronic kidney disease) stage 2, GFR 60-89 ml/min 06/11/2014  .  DM type 2 causing renal disease ([place]) 06/11/2014  . BPH (benign prostatic hypertrophy) 06/11/2014  . Obesity, morbid ([place]) 06/11/2014  . Essential hypertension   . NICM (nonischemic cardiomyopathy) ([place]) 06/08/2014  . History of cardiac arrest   . PEA (Pulseless  electrical activity) ([place]) 06/07/2014  . Degenerative arthritis of left knee 01/19/2014  . Arthritis of left knee 01/19/2014  . Unspecified constipation 03/31/2013  . Difficulty in walking(719.7) 03/12/2013  . Stiffness of right knee 03/12/2013  . Right leg weakness 03/12/2013  . Osteoarthritis of right knee 02/06/2013  . LBBB (left bundle branch block) 01/07/2013  . DM2 (diabetes mellitus, type 2) ([place]) 01/07/2013  . Hypothyroidism 01/07/2013  . Morbid obesity ([place]) 01/07/2013  . Dyslipidemia 01/07/2013    11:57 AM, 09/01/20 Mearl Latin PT, DPT Physical Therapist at [place], [place], [address] Phone: [phone]   Fax:  [phone]  Name: Rickey Rice MRN: [id number] Date of Birth: Jul 23, 1940

## 2020-09-03 ENCOUNTER — Telehealth (HOSPITAL_COMMUNITY): Payer: Self-pay

## 2020-09-03 ENCOUNTER — Ambulatory Visit (HOSPITAL_COMMUNITY): Payer: Medicare PPO

## 2020-09-03 NOTE — Telephone Encounter (Signed)
No show, called and left message concerning missed apt today.  Reminded next apt date and time with contact information if needs to cancel/reschedule in the future.  Ihor Austin, LPTA/CLT; Delana Meyer [phone]

## 2020-09-07 ENCOUNTER — Encounter (HOSPITAL_COMMUNITY): Payer: Self-pay

## 2020-09-07 ENCOUNTER — Other Ambulatory Visit: Payer: Self-pay

## 2020-09-07 ENCOUNTER — Ambulatory Visit (HOSPITAL_COMMUNITY): Payer: Medicare PPO

## 2020-09-07 DIAGNOSIS — R2689 Other abnormalities of gait and mobility: Secondary | ICD-10-CM | POA: Diagnosis not present

## 2020-09-07 DIAGNOSIS — R29898 Other symptoms and signs involving the musculoskeletal system: Secondary | ICD-10-CM

## 2020-09-07 NOTE — Therapy (Signed)
[place], [place], [address] Phone: [phone]   Fax:  [phone]  Physical Therapy Treatment  Patient Details  Name: Rickey Rice MRN: [id number] Date of Birth: Mar 31, 1940 Referring Provider (PT): Lucianne Lei MD   Encounter Date: 09/07/2020   PT End of Session - 09/07/20 1456     Visit Number 2    Number of Visits 7    Date for PT Re-Evaluation 09/29/20    Authorization Type [employer] Medicare approved 8 visits.    Authorization Time Period 6/8-09/29/20    Authorization - Visit Number 2    Authorization - Number of Visits 8    Progress Note Due on Visit 8    PT Start Time 1450    PT Stop Time 1528    PT Time Calculation (min) 38 min    Activity Tolerance Patient tolerated treatment well    Behavior During Therapy WFL for tasks assessed/performed             Past Medical History:  Diagnosis Date   AICD (automatic cardioverter/defibrillator) present    Arthritis    oa   Cardiac arrest ([place]) 06/07/2014   Primary VF arrest with successful resuscitation and s/p ICD implant   Diabetes mellitus without complication ([place])    type 2   Dyslipidemia    History of nuclear stress test 04/2012   lexiscan - 2 day protocol; low risk study, evidence of inferrior & apical scar but no ischemia    Hypertension    Hypothyroidism    LBBB (left bundle branch block)    Morbid obesity ([facility])    NICM (nonischemic cardiomyopathy) ([place])    Prostate enlargement    pt denies    Past Surgical History:  Procedure Laterality Date   BI-VENTRICULAR IMPLANTABLE CARDIOVERTER DEFIBRILLATOR N/A 06/18/2014   STJ CRTD implanted by Dr [name]   COLONOSCOPY W/ BIOPSIES     COLONOSCOPY WITH PROPOFOL N/A 04/05/2017   Procedure: COLONOSCOPY WITH PROPOFOL;  Surgeon: Juanita Craver, MD;  Location: [facility];  Service: Endoscopy;  Laterality: N/A;   FINGER SURGERY  1954   1 st joint  right hand amputated   KNEE ARTHROSCOPY Left    LEFT  HEART CATHETERIZATION WITH CORONARY ANGIOGRAM N/A 06/12/2014   Procedure: LEFT HEART CATHETERIZATION WITH CORONARY ANGIOGRAM;  Surgeon: Leonie Man, MD;  Location: [facility] CATH LAB;  Service: Cardiovascular;  Laterality: N/A;   [place]   TOTAL KNEE ARTHROPLASTY Right 02/05/2013   Procedure: TOTAL KNEE ARTHROPLASTY;  Surgeon: Kerin Salen, MD;  Location: [facility];  Service: Orthopedics;  Laterality: Right;   TOTAL KNEE ARTHROPLASTY Left 01/19/2014   Procedure: LEFT TOTAL KNEE ARTHROPLASTY;  Surgeon: Kerin Salen, MD;  Location: [facility];  Service: Orthopedics;  Laterality: Left;    There were no vitals filed for this visit.   Subjective Assessment - 09/07/20 1453     Subjective Pt reports he did a lot of standing at church last night, stated intermittent Rt hip pain 1-2/10.    Patient Stated Goals improve strength and walking    Currently in Pain? Yes    Pain Score 2     Pain Location Hip    Pain Orientation Left    Pain Descriptors / Indicators Burning;Constant    Pain Type Chronic pain    Pain Onset More than a month ago    Pain Frequency Constant    Pain Relieving Factors rub-on medication  [place] Adult PT Treatment/Exercise - 09/07/20 0001       Ambulation/Gait   Ambulation/Gait Yes    Ambulation/Gait Assistance 6: Modified independent (Device/Increase time)    Ambulation Distance (Feet) 266 Feet    Assistive device Straight cane    Gait Pattern Trunk flexed    Ambulation Surface Level;Indoor    Gait Comments 2 MWT      Exercises   Exercises Knee/Hip      Knee/Hip Exercises: Standing   Heel Raises 15 reps      Knee/Hip Exercises: Seated   Sit to Sand 1 set;10 reps;without UE support   elevated height with black foam     Knee/Hip Exercises: Supine   Bridges Both;10 reps    Bridges Limitations 2 sets      Knee/Hip Exercises: Sidelying   Clams 10x 5" BLE GTB                    PT Education -  09/07/20 1512     Education Details Reviewed goals, educated importance of HEP compliance for maximal benefits, pt able to recall current exercise program and given additional mat activities for gluteal strengthening to HEP with printout given.    Person(s) Educated Patient    Methods Explanation;Handout;Verbal cues    Comprehension Verbalized understanding;Returned demonstration              PT Short Term Goals - 09/01/20 1129       PT SHORT TERM GOAL #1   Title Patient will be independent with HEP in order to improve functional outcomes.    Time 2    Period Weeks    Status New    Target Date 09/15/20      PT SHORT TERM GOAL #2   Title Patient will report at least 25% improvement in symptoms for improved quality of life.    Time 2    Period Weeks    Status New    Target Date 09/15/20               PT Long Term Goals - 09/01/20 1130       PT LONG TERM GOAL #1   Title Patient will report at least 75% improvement in symptoms for improved quality of life.    Time 4    Period Weeks    Status New    Target Date 09/29/20      PT LONG TERM GOAL #2   Title Patient will be able to ambulate at least 350 feet in 2MWT in order to demonstrate improved gait speed for community ambulation.    Time 4    Period Weeks    Status New    Target Date 09/29/20      PT LONG TERM GOAL #3   Title Patient will be able to complete 5x STS in under 15 seconds in order to reduce the risk of falls.    Time 4    Period Weeks    Status New    Target Date 09/29/20                   Plan - 09/07/20 1517     Clinical Impression Statement Reviewed goals, educated importance of HEP compliance for maximal benefits, pt able to recall current exercises.  Session focus with proximal strengthening to improve gluteal/hip strengthening with functional activity.  Pt required elevated height to enable STS without HHA, additional gluteal strengthening exercises given to add to HEP with  printout given.  No reports of increased pain, was limited by [place].    Personal Factors and Comorbidities Comorbidity 3+;Age;Social Background;Time since onset of injury/illness/exacerbation;Fitness    Comorbidities obesity, HTN, DM    Examination-Activity Limitations Stairs;Stand;Transfers;Squat;Locomotion Level;Lift    Examination-Participation Restrictions Lawyer;Shop;Community Activity;Cleaning;Meal Prep    Clinical Decision Making Low    Rehab Potential Good    PT Frequency 2x / week    PT Duration 4 weeks    PT Treatment/Interventions ADLs/Self Care Home Management;Aquatic Therapy;Cryotherapy;Electrical Stimulation;Iontophoresis 4mg /ml Dexamethasone;Moist Heat;Traction;Ultrasound;Parrafin;DME Instruction;Gait training;Stair training;Functional mobility training;Therapeutic activities;Therapeutic exercise;Balance training;Neuromuscular re-education;Patient/family education;Orthotic Fit/Training;Manual techniques;Passive range of motion;Dry needling;Energy conservation;Splinting;Taping;Spinal Manipulations;Joint Manipulations    PT Next Visit Plan Assure pt is completeing walking program as HEP.  continue functional strengthening, standing and table exercises, endurance training with walking/nu step, progress as able    PT Home Exercise Plan 6/8 STS; 6/14: bridge, S/L clam GTB, heel raises.    Consulted and Agree with Plan of Care Patient             Patient will benefit from skilled therapeutic intervention in order to improve the following deficits and impairments:  Abnormal gait, Decreased activity tolerance, Difficulty walking, Decreased strength, Pain, Obesity, Decreased mobility, Decreased balance, Improper body mechanics  Visit Diagnosis: Other abnormalities of gait and mobility  Other symptoms and signs involving the musculoskeletal system     Problem List Patient Active Problem List   Diagnosis Date Noted   OSA (obstructive sleep apnea) 12/19/2018    Salivary stone 02/01/2018   Impacted cerumen of both ears 05/16/2017   Sensorineural hearing loss (SNHL), bilateral 05/16/2017   Encounter for immunization 02/15/2016   VT (ventricular tachycardia) ([facility]) 12/75/1700   Chronic systolic congestive heart failure ([place]) 09/24/2015   S/P implantation of automatic cardioverter/defibrillator (AICD) 07/26/2014   CKD (chronic kidney disease) stage 2, GFR 60-89 ml/min 06/11/2014   DM type 2 causing renal disease ([place]) 06/11/2014   BPH (benign prostatic hypertrophy) 06/11/2014   Obesity, morbid ([place]) 06/11/2014   Essential hypertension    NICM (nonischemic cardiomyopathy) ([place]) 06/08/2014   History of cardiac arrest    PEA (Pulseless electrical activity) ([place]) 06/07/2014   Degenerative arthritis of left knee 01/19/2014   Arthritis of left knee 01/19/2014   Unspecified constipation 03/31/2013   Difficulty in walking(719.7) 03/12/2013   Stiffness of right knee 03/12/2013   Right leg weakness 03/12/2013   Osteoarthritis of right knee 02/06/2013   LBBB (left bundle branch block) 01/07/2013   DM2 (diabetes mellitus, type 2) ([place]) 01/07/2013   Hypothyroidism 01/07/2013   Morbid obesity ([place]) 01/07/2013   Dyslipidemia 01/07/2013   Ihor Austin, LPTA/CLT; CBIS [phone]  Aldona Lento 09/07/2020, 7:08 PM  [facility] [address] [place], [place], [address] Phone: [phone]   Fax:  [phone]  Name: DAYLN TUGWELL MRN: [id number] Date of Birth: 02-08-1941

## 2020-09-08 ENCOUNTER — Encounter (HOSPITAL_COMMUNITY): Payer: Self-pay

## 2020-09-08 ENCOUNTER — Ambulatory Visit (HOSPITAL_COMMUNITY): Payer: Medicare PPO

## 2020-09-08 DIAGNOSIS — R2689 Other abnormalities of gait and mobility: Secondary | ICD-10-CM | POA: Diagnosis not present

## 2020-09-08 DIAGNOSIS — R29898 Other symptoms and signs involving the musculoskeletal system: Secondary | ICD-10-CM

## 2020-09-08 NOTE — Therapy (Signed)
[place], [place], [address] Phone: [phone]   Fax:  [phone]  Physical Therapy Treatment  Patient Details  Name: Rickey Rice MRN: [id number] Date of Birth: [birth year] Referring Provider (PT): Lucianne Lei MD   Encounter Date: 09/08/2020   PT End of Session - 09/08/20 0858     Visit Number 3    Number of Visits 8    Date for PT Re-Evaluation 09/29/20    Authorization Type [employer] approved 8 visits.    Authorization Time Period 6/8-09/29/20    Authorization - Visit Number 3    Authorization - Number of Visits 8    Progress Note Due on Visit 8    PT Start Time 0900    PT Stop Time 0945    PT Time Calculation (min) 45 min    Activity Tolerance Patient tolerated treatment well    Behavior During Therapy [facility] for tasks assessed/performed             Past Medical History:  Diagnosis Date   AICD (automatic cardioverter/defibrillator) present    Arthritis    oa   Cardiac arrest ([place]) 06/07/2014   Primary VF arrest with successful resuscitation and s/p ICD implant   Diabetes mellitus without complication ([place])    type 2   Dyslipidemia    History of nuclear stress test 04/2012   lexiscan - 2 day protocol; low risk study, evidence of inferrior & apical scar but no ischemia    Hypertension    Hypothyroidism    LBBB (left bundle branch block)    Morbid obesity (HCC)    NICM (nonischemic cardiomyopathy) ([place])    Prostate enlargement    pt denies    Past Surgical History:  Procedure Laterality Date   BI-VENTRICULAR IMPLANTABLE CARDIOVERTER DEFIBRILLATOR N/A 06/18/2014   STJ CRTD implanted by Dr [name]   COLONOSCOPY W/ BIOPSIES     COLONOSCOPY WITH PROPOFOL N/A 04/05/2017   Procedure: COLONOSCOPY WITH PROPOFOL;  Surgeon: Juanita Craver, MD;  Location: [facility];  Service: Endoscopy;  Laterality: N/A;   FINGER SURGERY  1954   1 st joint  right hand amputated   KNEE ARTHROSCOPY Left    LEFT  HEART CATHETERIZATION WITH CORONARY ANGIOGRAM N/A 06/12/2014   Procedure: LEFT HEART CATHETERIZATION WITH CORONARY ANGIOGRAM;  Surgeon: Leonie Man, MD;  Location: [facility] CATH LAB;  Service: Cardiovascular;  Laterality: N/A;   [place]   TOTAL KNEE ARTHROPLASTY Right 02/05/2013   Procedure: TOTAL KNEE ARTHROPLASTY;  Surgeon: Kerin Salen, MD;  Location: [place];  Service: Orthopedics;  Laterality: Right;   TOTAL KNEE ARTHROPLASTY Left 01/19/2014   Procedure: LEFT TOTAL KNEE ARTHROPLASTY;  Surgeon: Kerin Salen, MD;  Location: [place];  Service: Orthopedics;  Laterality: Left;    There were no vitals filed for this visit.   Subjective Assessment - 09/08/20 0906     Subjective Notes soreness along lateral thighs and feels muscle soreness from yesterday's session    Limitations House hold activities;Walking    Patient Stated Goals improve strength and walking    Currently in Pain? Yes    Pain Score 2     Pain Location Hip    Pain Orientation Right    Pain Descriptors / Indicators Aching    Pain Onset More than a month ago                [facility] PT Assessment - 09/08/20 0001  Assessment   Medical Diagnosis Pain in R Leg    Referring Provider (PT) Lucianne Lei MD    Onset Date/Surgical Date 02/25/20                           [facility] Adult PT Treatment/Exercise - 09/08/20 0001       Ambulation/Gait   Ambulation/Gait Yes    Ambulation/Gait Assistance 6: Modified independent (Device/Increase time)    Gait Comments treadmill x 5 min at 1.1 mph      Knee/Hip Exercises: Standing   Knee Flexion Strengthening;Both;3 sets;10 reps    Knee Flexion Limitations 3 lbs    Hip Flexion Stengthening;Both;3 sets;10 reps    Hip Flexion Limitations 4 lbs    Forward Lunges 5 seconds;20 reps   3x10 reps   Forward Lunges Limitations stretch on 12" step    Hip ADduction AROM;Both;2 sets;10 reps    Hip ADduction Limitations performed as hip circumduction    Other  Standing Knee Exercises sidestepping x 2 min in // bars      Knee/Hip Exercises: Seated   Long Arc Quad Strengthening;Both;3 sets;10 reps    Long Arc Quad Weight 3 lbs.    Sit to Sand with UE support;2 sets;10 reps                      PT Short Term Goals - 09/01/20 1129       PT SHORT TERM GOAL #1   Title Patient will be independent with HEP in order to improve functional outcomes.    Time 2    Period Weeks    Status New    Target Date 09/15/20      PT SHORT TERM GOAL #2   Title Patient will report at least 25% improvement in symptoms for improved quality of life.    Time 2    Period Weeks    Status New    Target Date 09/15/20               PT Long Term Goals - 09/01/20 1130       PT LONG TERM GOAL #1   Title Patient will report at least 75% improvement in symptoms for improved quality of life.    Time 4    Period Weeks    Status New    Target Date 09/29/20      PT LONG TERM GOAL #2   Title Patient will be able to ambulate at least 350 feet in 2MWT in order to demonstrate improved gait speed for community ambulation.    Time 4    Period Weeks    Status New    Target Date 09/29/20      PT LONG TERM GOAL #3   Title Patient will be able to complete 5x STS in under 15 seconds in order to reduce the risk of falls.    Time 4    Period Weeks    Status New    Target Date 09/29/20                   Plan - 09/08/20 0942     Clinical Impression Statement Patient demonstrating improved activity tolerance with minimal exertion/fatigue and no adverse reactions.  Pt demo decreased gait speed and uneven step length left vs right when ambulating on treadmill.  Continued PT services indicated to improve activity tolerance, LE strength, and facilitate increased gait speed.  Personal Factors and Comorbidities Comorbidity 3+;Age;Social Background;Time since onset of injury/illness/exacerbation;Fitness    Comorbidities obesity, HTN, DM     Examination-Activity Limitations Stairs;Stand;Transfers;Squat;Locomotion Level;Lift    Examination-Participation Restrictions Lawyer;Shop;Community Activity;Cleaning;Meal Prep    Rehab Potential Good    PT Frequency 2x / week    PT Duration 4 weeks    PT Treatment/Interventions ADLs/Self Care Home Management;Aquatic Therapy;Cryotherapy;Electrical Stimulation;Iontophoresis 4mg /ml Dexamethasone;Moist Heat;Traction;Ultrasound;Parrafin;DME Instruction;Gait training;Stair training;Functional mobility training;Therapeutic activities;Therapeutic exercise;Balance training;Neuromuscular re-education;Patient/family education;Orthotic Fit/Training;Manual techniques;Passive range of motion;Dry needling;Energy conservation;Splinting;Taping;Spinal Manipulations;Joint Manipulations    PT Next Visit Plan Assure pt is completeing walking program as HEP.  continue functional strengthening, standing and table exercises, endurance training with walking/nu step, progress as able    PT Home Exercise Plan 6/8 STS; 6/14: bridge, S/L clam GTB, heel raises.    Consulted and Agree with Plan of Care Patient             Patient will benefit from skilled therapeutic intervention in order to improve the following deficits and impairments:  Abnormal gait, Decreased activity tolerance, Difficulty walking, Decreased strength, Pain, Obesity, Decreased mobility, Decreased balance, Improper body mechanics  Visit Diagnosis: Other abnormalities of gait and mobility  Other symptoms and signs involving the musculoskeletal system     Problem List Patient Active Problem List   Diagnosis Date Noted   OSA (obstructive sleep apnea) 12/19/2018   Salivary stone 02/01/2018   Impacted cerumen of both ears 05/16/2017   Sensorineural hearing loss (SNHL), bilateral 05/16/2017   Encounter for immunization 02/15/2016   VT (ventricular tachycardia) ([place]) 88/28/0034   Chronic systolic congestive heart failure ([place])  09/24/2015   S/P implantation of automatic cardioverter/defibrillator (AICD) 07/26/2014   CKD (chronic kidney disease) stage 2, GFR 60-89 ml/min 06/11/2014   DM type 2 causing renal disease ([place]) 06/11/2014   BPH (benign prostatic hypertrophy) 06/11/2014   Obesity, morbid ([place]) 06/11/2014   Essential hypertension    NICM (nonischemic cardiomyopathy) ([place]) 06/08/2014   History of cardiac arrest    PEA (Pulseless electrical activity) ([place]) 06/07/2014   Degenerative arthritis of left knee 01/19/2014   Arthritis of left knee 01/19/2014   Unspecified constipation 03/31/2013   Difficulty in walking(719.7) 03/12/2013   Stiffness of right knee 03/12/2013   Right leg weakness 03/12/2013   Osteoarthritis of right knee 02/06/2013   LBBB (left bundle branch block) 01/07/2013   DM2 (diabetes mellitus, type 2) ([place]) 01/07/2013   Hypothyroidism 01/07/2013   Morbid obesity ([place]) 01/07/2013   Dyslipidemia 01/07/2013   9:45 AM, 09/08/20 M. Sherlyn Lees, PT, DPT Physical Therapist- [place] Office Number: [phone]   [place] [address] [place], [place], [address] Phone: [phone]   Fax:  [phone]  Name: Rickey Rice MRN: [id number] Date of Birth: 04-03-1940

## 2020-09-12 DIAGNOSIS — Z95810 Presence of automatic (implantable) cardiac defibrillator: Secondary | ICD-10-CM | POA: Insufficient documentation

## 2020-09-13 ENCOUNTER — Encounter: Payer: Self-pay | Admitting: Internal Medicine

## 2020-09-13 ENCOUNTER — Ambulatory Visit: Payer: Medicare PPO | Admitting: Internal Medicine

## 2020-09-13 ENCOUNTER — Other Ambulatory Visit: Payer: Self-pay

## 2020-09-13 VITALS — BP 110/64 | HR 59 | Ht 71.5 in | Wt 314.6 lb

## 2020-09-13 DIAGNOSIS — I428 Other cardiomyopathies: Secondary | ICD-10-CM | POA: Diagnosis not present

## 2020-09-13 DIAGNOSIS — I5022 Chronic systolic (congestive) heart failure: Secondary | ICD-10-CM | POA: Diagnosis not present

## 2020-09-13 DIAGNOSIS — Z95810 Presence of automatic (implantable) cardiac defibrillator: Secondary | ICD-10-CM | POA: Diagnosis not present

## 2020-09-13 DIAGNOSIS — I472 Ventricular tachycardia, unspecified: Secondary | ICD-10-CM

## 2020-09-13 NOTE — Progress Notes (Signed)
Patient ID: Rickey Rice, male   DOB: April 19, 1940, 80 y.o.   MRN: [id number]      Patient Care Team: Lucianne Lei, MD as PCP - General (Family Medicine) Debara Pickett Nadean Corwin, MD as PCP - Cardiology (Cardiology) Sueanne Margarita, MD as PCP - Sleep Medicine (Cardiology)   HPI  Rickey Rice is a 80 y.o. male Seen in follow-up for CRT-D implantation for aborted cardiac arrest in the setting of nonischemic cardiac myopathy and left bundle branch block.  Because of a short RV LV interval, it was ELECTED NOT to activate the LV leads  initially. We subsequently have  turned on the LV lead       Today, the patient denies chest pain,  , or orthopnea   There have been no palpitations, lightheadedness or syncope.  Complains of peripheral edema and nocturnal dyspnea and shortness of breath with exertion. He notes occasional having peripheral edema.      He do have nocturnal dyspnea uses a Cpap  He states he have not been physically active as much as he like  He notes he is taking therapy but when he walks in neighborhood he uses a walking device and when walking up an incline he takes pauses in the increments of 2 mins at a time    Date Cr K Hgb  11/17  1.24 4.2     6/19 1.20 4.2   9/20 1.34 4.4 (5/20)12.0  4/21 1.49 4.4   5/22 1.16  4.1 11.4   DATE TEST EF %   3/16 LHC  No obstructive CAD  3/16    Echo   EF 40 %   8/17    Echo   EF 30-35 %   2/20   Echo  25-30%   2/21 Echo  35-40%    Past Medical History:  Diagnosis Date   AICD (automatic cardioverter/defibrillator) present    Arthritis    oa   Cardiac arrest ([place]) 06/07/2014   Primary VF arrest with successful resuscitation and s/p ICD implant   Diabetes mellitus without complication ([place])    type 2   Dyslipidemia    History of nuclear stress test 04/2012   lexiscan - 2 day protocol; low risk study, evidence of inferrior & apical scar but no ischemia    Hypertension    Hypothyroidism    LBBB (left bundle branch block)     Morbid obesity (HCC)    NICM (nonischemic cardiomyopathy) ([place])    Prostate enlargement    pt denies    Past Surgical History:  Procedure Laterality Date   BI-VENTRICULAR IMPLANTABLE CARDIOVERTER DEFIBRILLATOR N/A 06/18/2014   STJ CRTD implanted by Dr [name]   COLONOSCOPY W/ BIOPSIES     COLONOSCOPY WITH PROPOFOL N/A 04/05/2017   Procedure: COLONOSCOPY WITH PROPOFOL;  Surgeon: Juanita Craver, MD;  Location: [facility];  Service: Endoscopy;  Laterality: N/A;   FINGER SURGERY  1954   1 st joint  right hand amputated   KNEE ARTHROSCOPY Left    LEFT HEART CATHETERIZATION WITH CORONARY ANGIOGRAM N/A 06/12/2014   Procedure: LEFT HEART CATHETERIZATION WITH CORONARY ANGIOGRAM;  Surgeon: Leonie Man, MD;  Location: [facility] CATH LAB;  Service: Cardiovascular;  Laterality: N/A;   [place]   TOTAL KNEE ARTHROPLASTY Right 02/05/2013   Procedure: TOTAL KNEE ARTHROPLASTY;  Surgeon: Kerin Salen, MD;  Location: [place];  Service: Orthopedics;  Laterality: Right;   TOTAL KNEE ARTHROPLASTY Left 01/19/2014   Procedure: LEFT TOTAL KNEE  ARTHROPLASTY;  Surgeon: Kerin Salen, MD;  Location: [place];  Service: Orthopedics;  Laterality: Left;    Current Outpatient Medications  Medication Sig Dispense Refill   acetaminophen (TYLENOL) 500 MG tablet Take 500 mg by mouth daily as needed for moderate pain or headache.     aspirin EC 81 MG tablet Take 81 mg by mouth daily.     brimonidine (ALPHAGAN) 0.2 % ophthalmic solution Place 1 drop into the left eye 3 (three) times daily.     carboxymethylcellulose (REFRESH PLUS) 0.5 % SOLN Place 1 drop into both eyes daily.     carvedilol (COREG) 12.5 MG tablet TAKE 1 TABLET BY MOUTH TWICE DAILY WITH A MEAL 180 tablet 3   ENTRESTO 97-103 MG TAKE 1 TABLET BY MOUTH TWICE DAILY 60 tablet 9   furosemide (LASIX) 40 MG tablet TAKE 1 TABLET BY MOUTH EVERY DAY 90 tablet 3   glimepiride (AMARYL) 4 MG tablet Take 8 mg by mouth daily before breakfast.      glucose blood test  strip USE AS DIRECTED TO CHECK BLOOD SUGAR BID     levothyroxine (SYNTHROID, LEVOTHROID) 75 MCG tablet Take 75 mcg by mouth every evening. Monday through Saturday     Multiple Vitamins-Minerals (ICAPS AREDS 2) CAPS Take 1 capsule by mouth 2 (two) times daily.     Omega-3 Fatty Acids (OMEGA 3 PO) Take 1,600 mg by mouth every other day.     rosuvastatin (CRESTOR) 10 MG tablet Take 1 tablet (10 mg total) by mouth daily at 6 PM. 30 tablet 0   saxagliptin HCl (ONGLYZA) 5 MG TABS tablet Take 5 mg by mouth daily.     spironolactone (ALDACTONE) 25 MG tablet TAKE 1/2 TABLET(12.5 MG) BY MOUTH DAILY 45 tablet 3   TURMERIC CURCUMIN PO Take 1 tablet by mouth every other day.     No current facility-administered medications for this visit.    Allergies  Allergen Reactions   Bee Venom Swelling    Swelling on lips and tongue   Demerol [Meperidine]     Not sure   Pioglitazone     Lips swelling= actos   Shrimp [Shellfish Allergy]     rash   Sulfa Antibiotics     Not sure     Review of Systems negative except from HPI and PMH  Physical Exam: BP 110/64   Pulse (!) 59   Ht 5' 11.5" (1.816 m)   Wt (!) 314 lb 9.6 oz (142.7 kg)   SpO2 98%   BMI 43.27 kg/m  Well developed and Morbidly obese in no acute distress HENT normal Neck supple with JVP-9 Lungs Clear Regular rate and rhythm, no  gallop No  murmur Abd-soft with active BS No Clubbing cyanosis 1+ peripheral edema Skin-warm and dry A & Oriented  Grossly normal sensory and motor function  ECG sinus at 59 with P synchronous pacing with an upright QRS lead V1 and negative QRS lead I 16/18/46   ECG #1 demonstrated left bundle branch block. It turned out this was associated with some threshold left ventricular pacing.   ECG #2 with of increased output on the LV lead demonstrated a negative QRS in lead 1 and a biphasic QRS in lead V1   ECG #3 to demonstrated negative QRS in lead 1 and upright QRS in lead V1    Assessment and   Plan  NICM  Aborted cardiac Arrest  VT-polymorphic /syncope   CRT-D implant  Hypertension  CHF chronic-systolic/diastolic  Mildly volume overloaded.  We will increase his furosemide from 40--80 daily x2 days.  No interval ventricular arrhythmias.  Continue the carvedilol  Continue him on carvedilol 12.5 Entresto 97/103 and Aldactone 12.5 for his cardiomyopathy.  Further up titration is limited by his blood pressure.  He is to see his PCP on Thursday.  We will ask her to switch him to an SGLT2 instead of the glimepiride     I,Stephanie Williams,acting as a scribe for Virl Axe, MD.,have documented all relevant documentation on the behalf of Virl Axe, MD,as directed by  Virl Axe, MD while in the presence of Virl Axe, MD.  I, Virl Axe, MD, have reviewed all documentation for this visit. The documentation on 09/13/20 for the exam, diagnosis, procedures, and orders are all accurate and complete.

## 2020-09-13 NOTE — Progress Notes (Signed)
Error

## 2020-09-13 NOTE — Patient Instructions (Signed)
Medication Instructions:  Your physician has recommended you make the following change in your medication:  ** Take Lasix 80mg  (40mg -2 tablets) each morning x 2 days. *If you need a refill on your cardiac medications before your next appointment, please call your pharmacy*   Lab Work: None ordered.  If you have labs (blood work) drawn today and your tests are completely normal, you will receive your results only by: [place] (if you have MyChart) OR A paper copy in the mail If you have any lab test that is abnormal or we need to change your treatment, we will call you to review the results.   Testing/Procedures: None ordered.    Follow-Up: At [facility], you and your health needs are our priority.  As part of our continuing mission to provide you with exceptional heart care, we have created designated Provider Care Teams.  These Care Teams include your primary Cardiologist (physician) and Advanced Practice Providers (APPs -  Physician Assistants and Nurse Practitioners) who all work together to provide you with the care you need, when you need it.  We recommend signing up for the patient portal called "MyChart".  Sign up information is provided on this After Visit Summary.  MyChart is used to connect with patients for Virtual Visits (Telemedicine).  Patients are able to view lab/test results, encounter notes, upcoming appointments, etc.  Non-urgent messages can be sent to your provider as well.   To learn more about what you can do with MyChart, go to [email or web address].    Your next appointment:   2 month(s)  The format for your next appointment:   In Person  Provider:   Virl Axe, MD

## 2020-09-14 ENCOUNTER — Ambulatory Visit (HOSPITAL_COMMUNITY): Payer: Medicare PPO

## 2020-09-14 ENCOUNTER — Encounter (HOSPITAL_COMMUNITY): Payer: Self-pay

## 2020-09-14 VITALS — HR 76

## 2020-09-14 DIAGNOSIS — R2689 Other abnormalities of gait and mobility: Secondary | ICD-10-CM

## 2020-09-14 DIAGNOSIS — R29898 Other symptoms and signs involving the musculoskeletal system: Secondary | ICD-10-CM

## 2020-09-14 NOTE — Therapy (Signed)
[place], [place], [address] Phone: [phone]   Fax:  [phone]  Physical Therapy Treatment  Patient Details  Name: Rickey Rice MRN: [id number] Date of Birth: 09-12-1940 Referring Provider (PT): Lucianne Lei MD   Encounter Date: 09/14/2020   PT End of Session - 09/14/20 0931     Visit Number 4    Number of Visits 8    Date for PT Re-Evaluation 09/29/20    Authorization Type [employer] approved 8 visits.    Authorization Time Period 6/8-09/29/20    Authorization - Visit Number 4    Authorization - Number of Visits 8    Progress Note Due on Visit 8    PT Start Time 0914    PT Stop Time 0959    PT Time Calculation (min) 45 min    Activity Tolerance Patient tolerated treatment well    Behavior During Therapy Rickey Rice for tasks assessed/performed             Past Medical History:  Diagnosis Date   AICD (automatic cardioverter/defibrillator) present    Arthritis    oa   Cardiac arrest ([place]) 06/07/2014   Primary VF arrest with successful resuscitation and s/p ICD implant   Diabetes mellitus without complication ([place])    type 2   Dyslipidemia    History of nuclear stress test 04/2012   lexiscan - 2 day protocol; low risk study, evidence of inferrior & apical scar but no ischemia    Hypertension    Hypothyroidism    LBBB (left bundle branch block)    Morbid obesity (HCC)    NICM (nonischemic cardiomyopathy) ([place])    Prostate enlargement    pt denies    Past Surgical History:  Procedure Laterality Date   BI-VENTRICULAR IMPLANTABLE CARDIOVERTER DEFIBRILLATOR N/A 06/18/2014   STJ CRTD implanted by Dr [name]   COLONOSCOPY W/ BIOPSIES     COLONOSCOPY WITH PROPOFOL N/A 04/05/2017   Procedure: COLONOSCOPY WITH PROPOFOL;  Surgeon: Juanita Craver, MD;  Location: [facility];  Service: Endoscopy;  Laterality: N/A;   FINGER SURGERY  1954   1 st joint  right hand amputated   KNEE ARTHROSCOPY Left    LEFT  HEART CATHETERIZATION WITH CORONARY ANGIOGRAM N/A 06/12/2014   Procedure: LEFT HEART CATHETERIZATION WITH CORONARY ANGIOGRAM;  Surgeon: Leonie Man, MD;  [facility]: [facility] CATH LAB;  Service: Cardiovascular;  Laterality: N/A;   [place]   TOTAL KNEE ARTHROPLASTY Right 02/05/2013   Procedure: TOTAL KNEE ARTHROPLASTY;  Surgeon: Kerin Salen, MD;  Location: [facility];  Service: Orthopedics;  Laterality: Right;   TOTAL KNEE ARTHROPLASTY Left 01/19/2014   Procedure: LEFT TOTAL KNEE ARTHROPLASTY;  Surgeon: Kerin Salen, MD;  Location: [facility];  Service: Orthopedics;  Laterality: Left;    Vitals:   09/14/20 0930  Pulse: 76  SpO2: 96%     Subjective Assessment - 09/14/20 0923     Subjective Saw heart MD yesterday, stated may be a change in medication following visit with PCP on Thursday to reduce SOB.  Pt stated he has difficulty with SOB and requires seated rest break when walking up hill.    Patient Stated Goals improve strength and walking    Currently in Pain? No/denies                               Rickey Rice Adult PT Treatment/Exercise - 09/14/20  0001       Ambulation/Gait   Ambulation/Gait Yes    Ambulation/Gait Assistance 6: Modified independent (Device/Increase time)    Stairs Yes    Stairs Assistance 5: Supervision;4: Min guard    Stairs Assistance Details (indicate cue type and reason) Must lead 7in step height wiht Lt due to weakness, able to complete reciprocal pattern with 4 in step height    Stair Management Technique One rail Right;Step to pattern;Alternating pattern   Lt leading ascending 7 in, reciprocal pattern descending 4in   Gait Comments 3 sets x 32min with gait speed 1.1-->1.3; elevation at 3      Exercises   Exercises Knee/Hip      Knee/Hip Exercises: Standing   Heel Raises 15 reps    Heel Raises Limitations incline slope, toe raises decline slope    Knee Flexion Strengthening;Both   1 minute duration   Knee Flexion Limitations 4#    Hip  Flexion Stengthening;Both   1 min duration   Hip Flexion Limitations 4 lbs    Hip ADduction AROM;Strengthening    Hip ADduction Limitations 1 min duration BLE 4# each    Hip Extension Both;2 sets;10 reps    Extension Limitations 4#    Forward Step Up Both;10 reps;Hand Hold: 1;Step Height: 4";2 sets      Knee/Hip Exercises: Seated   Long Arc Quad Strengthening;Both;10 reps;2 sets    [employer] Weight 4 lbs.                      PT Short Term Goals - 09/01/20 1129       PT SHORT TERM GOAL #1   Title Patient will be independent with HEP in order to improve functional outcomes.    Time 2    Period Weeks    Status New    Target Date 09/15/20      PT SHORT TERM GOAL #2   Title Patient will report at least 25% improvement in symptoms for improved quality of life.    Time 2    Period Weeks    Status New    Target Date 09/15/20               PT Long Term Goals - 09/01/20 1130       PT LONG TERM GOAL #1   Title Patient will report at least 75% improvement in symptoms for improved quality of life.    Time 4    Period Weeks    Status New    Target Date 09/29/20      PT LONG TERM GOAL #2   Title Patient will be able to ambulate at least 350 feet in 2MWT in order to demonstrate improved gait speed for community ambulation.    Time 4    Period Weeks    Status New    Target Date 09/29/20      PT LONG TERM GOAL #3   Title Patient will be able to complete 5x STS in under 15 seconds in order to reduce the risk of falls.    Time 4    Period Weeks    Status New    Target Date 09/29/20                   Plan - 09/14/20 0932     Clinical Impression Statement Session focus on improving activitiy tolerance and  LE strengthening.  Added elevation with gait training, pt easily fatigue and required  standing rest break following 2 min.  Discussed and encouraged to begin walking program as additional HEP starting at 3-5 minutes and increase time as able.   Some of the strengthening exercises based upon time vs reps for activity tolerance.  Added step up training for functional strengthening.  Pt tolerated well to session wtih no reports of pain, was limited by fatigue required short duration rest breaks between exercises.    Personal Factors and Comorbidities Comorbidity 3+;Age;Social Background;Time since onset of injury/illness/exacerbation;Fitness    Comorbidities obesity, HTN, DM    Examination-Activity Limitations Stairs;Stand;Transfers;Squat;Locomotion Level;Lift    Examination-Participation Restrictions Lawyer;Shop;Community Activity;Cleaning;Meal Prep    Clinical Decision Making Low    Rehab Potential Good    PT Frequency 2x / week    PT Duration 4 weeks    PT Treatment/Interventions ADLs/Self Care Home Management;Aquatic Therapy;Cryotherapy;Electrical Stimulation;Iontophoresis 4mg /ml Dexamethasone;Moist Heat;Traction;Ultrasound;Parrafin;DME Instruction;Gait training;Stair training;Functional mobility training;Therapeutic activities;Therapeutic exercise;Balance training;Neuromuscular re-education;Patient/family education;Orthotic Fit/Training;Manual techniques;Passive range of motion;Dry needling;Energy conservation;Splinting;Taping;Spinal Manipulations;Joint Manipulations    PT Next Visit Plan Assure pt is completeing walking program as HEP.  continue functional strengthening, standing and table exercises, endurance training with walking/nu step, progress as able    PT Home Exercise Plan 6/8 STS; 6/14: bridge, S/L clam GTB, heel raises. 6/21: begin walking program from 3-5 minutes and increase time as able.    Consulted and Agree with Plan of Care Patient             Patient will benefit from skilled therapeutic intervention in order to improve the following deficits and impairments:  Abnormal gait, Decreased activity tolerance, Difficulty walking, Decreased strength, Pain, Obesity, Decreased mobility, Decreased balance,  Improper body mechanics  Visit Diagnosis: Other abnormalities of gait and mobility  Other symptoms and signs involving the musculoskeletal system     Problem List Patient Active Problem List   Diagnosis Date Noted   Biventricular ICD (implantable cardioverter-defibrillator) in place 09/12/2020   OSA (obstructive sleep apnea) 12/19/2018   Salivary stone 02/01/2018   Impacted cerumen of both ears 05/16/2017   Sensorineural hearing loss (SNHL), bilateral 05/16/2017   Encounter for immunization 02/15/2016   VT (ventricular tachycardia) ([place]) 09/17/7626   Chronic systolic congestive heart failure ([place]) 09/24/2015   S/P implantation of automatic cardioverter/defibrillator (AICD) 07/26/2014   CKD (chronic kidney disease) stage 2, GFR 60-89 ml/min 06/11/2014   DM type 2 causing renal disease ([place]) 06/11/2014   BPH (benign prostatic hypertrophy) 06/11/2014   Obesity, morbid ([place]) 06/11/2014   Essential hypertension    NICM (nonischemic cardiomyopathy) ([place]) 06/08/2014   History of cardiac arrest    PEA (Pulseless electrical activity) ([place]) 06/07/2014   Degenerative arthritis of left knee 01/19/2014   Arthritis of left knee 01/19/2014   Unspecified constipation 03/31/2013   Difficulty in walking(719.7) 03/12/2013   Stiffness of right knee 03/12/2013   Right leg weakness 03/12/2013   Osteoarthritis of right knee 02/06/2013   LBBB (left bundle branch block) 01/07/2013   DM2 (diabetes mellitus, type 2) ([place]) 01/07/2013   Hypothyroidism 01/07/2013   Morbid obesity ([place]) 01/07/2013   Dyslipidemia 01/07/2013   Rickey Rice, Rickey Rice  Rickey Rice 09/14/2020, 10:21 AM  [facility] [address] [place], [place], [address] Phone: [phone]   Fax:  [phone]  Name: Rickey Rice MRN: [id number] Date of Birth: 10-12-1940

## 2020-09-16 ENCOUNTER — Ambulatory Visit (HOSPITAL_COMMUNITY): Payer: Medicare PPO | Admitting: Physical Therapy

## 2020-09-16 DIAGNOSIS — I509 Heart failure, unspecified: Secondary | ICD-10-CM | POA: Diagnosis not present

## 2020-09-16 DIAGNOSIS — I110 Hypertensive heart disease with heart failure: Secondary | ICD-10-CM | POA: Diagnosis not present

## 2020-09-17 ENCOUNTER — Other Ambulatory Visit: Payer: Self-pay

## 2020-09-17 ENCOUNTER — Encounter (HOSPITAL_COMMUNITY): Payer: Self-pay

## 2020-09-17 ENCOUNTER — Ambulatory Visit (HOSPITAL_COMMUNITY): Payer: Medicare PPO

## 2020-09-17 DIAGNOSIS — R29898 Other symptoms and signs involving the musculoskeletal system: Secondary | ICD-10-CM | POA: Diagnosis not present

## 2020-09-17 DIAGNOSIS — R2689 Other abnormalities of gait and mobility: Secondary | ICD-10-CM | POA: Diagnosis not present

## 2020-09-17 NOTE — Therapy (Signed)
[place], [place], [address] Phone: [phone]   Fax:  [phone]  Physical Therapy Treatment  Patient Details  Name: Rickey Rice MRN: [id number] Date of Birth: 10/22/1940 Referring Provider (PT): Lucianne Lei MD   Encounter Date: 09/17/2020   PT End of Session - 09/17/20 1042     Visit Number 5    Number of Visits 8    Date for PT Re-Evaluation 09/29/20    Authorization Type [employer] approved 8 visits.    Authorization Time Period 6/8-09/29/20    Authorization - Visit Number 5    Authorization - Number of Visits 8    Progress Note Due on Visit 8    PT Start Time 1030    PT Stop Time 1115    PT Time Calculation (min) 45 min    Activity Tolerance Patient tolerated treatment well    Behavior During Therapy WFL for tasks assessed/performed             Past Medical History:  Diagnosis Date   AICD (automatic cardioverter/defibrillator) present    Arthritis    oa   Cardiac arrest ([place]) 06/07/2014   Primary VF arrest with successful resuscitation and s/p ICD implant   Diabetes mellitus without complication ([place])    type 2   Dyslipidemia    History of nuclear stress test 04/2012   lexiscan - 2 day protocol; low risk study, evidence of inferrior & apical scar but no ischemia    Hypertension    Hypothyroidism    LBBB (left bundle branch block)    Morbid obesity (HCC)    NICM (nonischemic cardiomyopathy) ([place])    Prostate enlargement    pt denies    Past Surgical History:  Procedure Laterality Date   BI-VENTRICULAR IMPLANTABLE CARDIOVERTER DEFIBRILLATOR N/A 06/18/2014   STJ CRTD implanted by Dr [name]   COLONOSCOPY W/ BIOPSIES     COLONOSCOPY WITH PROPOFOL N/A 04/05/2017   Procedure: COLONOSCOPY WITH PROPOFOL;  Surgeon: Juanita Craver, MD;  [facility];  Service: Endoscopy;  Laterality: N/A;   FINGER SURGERY  1954   1 st joint  right hand amputated   KNEE ARTHROSCOPY Left    LEFT  HEART CATHETERIZATION WITH CORONARY ANGIOGRAM N/A 06/12/2014   Procedure: LEFT HEART CATHETERIZATION WITH CORONARY ANGIOGRAM;  Surgeon: Leonie Man, MD;  Location: [facility] CATH LAB;  Service: Cardiovascular;  Laterality: N/A;   [place]   TOTAL KNEE ARTHROPLASTY Right 02/05/2013   Procedure: TOTAL KNEE ARTHROPLASTY;  Surgeon: Kerin Salen, MD;  Location: [facility];  Service: Orthopedics;  Laterality: Right;   TOTAL KNEE ARTHROPLASTY Left 01/19/2014   Procedure: LEFT TOTAL KNEE ARTHROPLASTY;  Surgeon: Kerin Salen, MD;  Location: [place] [facility];  Service: Orthopedics;  Laterality: Left;    There were no vitals filed for this visit.   Subjective Assessment - 09/17/20 1042     Subjective No new issues    Patient Stated Goals improve strength and walking                [facility] PT Assessment - 09/17/20 0001       Assessment   Medical Diagnosis Pain in R Leg    Referring Provider (PT) Lucianne Lei MD    Onset Date/Surgical Date 02/25/20                           [facility] Adult PT Treatment/Exercise -  09/17/20 0001       Knee/Hip Exercises: Standing   Knee Flexion Strengthening;Both;3 sets;10 reps    Knee Flexion Limitations 5#    Hip Flexion Stengthening;Both;3 sets;10 reps    Hip Flexion Limitations 5#    Hip Extension Stengthening;Both;3 sets;10 reps    Extension Limitations 5#    Other Standing Knee Exercises sidestepping x 2 min in // bars 5 lbs ankle weights      Knee/Hip Exercises: Seated   Long Arc Quad Strengthening;Both;3 sets;10 reps    Long Arc Quad Weight 5 lbs.    Ball Squeeze 2x40    Other Seated Knee/Hip Exercises tandem stance 4x15 sec, offset standing 4x15 sec to improve SLS                      PT Short Term Goals - 09/01/20 1129       PT SHORT TERM GOAL #1   Title Patient will be independent with HEP in order to improve functional outcomes.    Time 2    Period Weeks    Status New    Target Date 09/15/20      PT SHORT TERM  GOAL #2   Title Patient will report at least 25% improvement in symptoms for improved quality of life.    Time 2    Period Weeks    Status New    Target Date 09/15/20               PT Long Term Goals - 09/01/20 1130       PT LONG TERM GOAL #1   Title Patient will report at least 75% improvement in symptoms for improved quality of life.    Time 4    Period Weeks    Status New    Target Date 09/29/20      PT LONG TERM GOAL #2   Title Patient will be able to ambulate at least 350 feet in 2MWT in order to demonstrate improved gait speed for community ambulation.    Time 4    Period Weeks    Status New    Target Date 09/29/20      PT LONG TERM GOAL #3   Title Patient will be able to complete 5x STS in under 15 seconds in order to reduce the risk of falls.    Time 4    Period Weeks    Status New    Target Date 09/29/20                   Plan - 09/17/20 1114     Clinical Impression Statement Progressing well with POC details and demonstrating greater activity tolerance as evidenced by increased resistance and decrease rest periods during session.  Difficulty with narrow BOS and single limb stance abilities requiring touch support Q 1-2 sec. Continued POC indicated to progress BLE strength, balance, and functional activity tolerance    Personal Factors and Comorbidities Comorbidity 3+;Age;Social Background;Time since onset of injury/illness/exacerbation;Fitness    Comorbidities obesity, HTN, DM    Examination-Activity Limitations Stairs;Stand;Transfers;Squat;Locomotion Level;Lift    Examination-Participation Restrictions Lawyer;Shop;Community Activity;Cleaning;Meal Prep    Rehab Potential Good    PT Frequency 2x / week    PT Duration 4 weeks    PT Treatment/Interventions ADLs/Self Care Home Management;Aquatic Therapy;Cryotherapy;Electrical Stimulation;Iontophoresis 4mg /ml Dexamethasone;Moist Heat;Traction;Ultrasound;Parrafin;DME Instruction;Gait  training;Stair training;Functional mobility training;Therapeutic activities;Therapeutic exercise;Balance training;Neuromuscular re-education;Patient/family education;Orthotic Fit/Training;Manual techniques;Passive range of motion;Dry needling;Energy conservation;Splinting;Taping;Spinal Manipulations;Joint Manipulations    PT Next  Visit Plan Assure pt is completeing walking program as HEP.  continue functional strengthening, standing and table exercises, endurance training with walking/nu step, progress as able    PT Home Exercise Plan 6/8 STS; 6/14: bridge, S/L clam GTB, heel raises. 6/21: begin walking program from 3-5 minutes and increase time as able.    Consulted and Agree with Plan of Care Patient             Patient will benefit from skilled therapeutic intervention in order to improve the following deficits and impairments:  Abnormal gait, Decreased activity tolerance, Difficulty walking, Decreased strength, Pain, Obesity, Decreased mobility, Decreased balance, Improper body mechanics  Visit Diagnosis: Other abnormalities of gait and mobility  Other symptoms and signs involving the musculoskeletal system     Problem List Patient Active Problem List   Diagnosis Date Noted   Biventricular ICD (implantable cardioverter-defibrillator) in place 09/12/2020   OSA (obstructive sleep apnea) 12/19/2018   Salivary stone 02/01/2018   Impacted cerumen of both ears 05/16/2017   Sensorineural hearing loss (SNHL), bilateral 05/16/2017   Encounter for immunization 02/15/2016   VT (ventricular tachycardia) ([place]) 41/96/2229   Chronic systolic congestive heart failure ([place]) 09/24/2015   S/P implantation of automatic cardioverter/defibrillator (AICD) 07/26/2014   CKD (chronic kidney disease) stage 2, GFR 60-89 ml/min 06/11/2014   DM type 2 causing renal disease ([place]) 06/11/2014   BPH (benign prostatic hypertrophy) 06/11/2014   Obesity, morbid ([place]) 06/11/2014   Essential hypertension    NICM  (nonischemic cardiomyopathy) ([place]) 06/08/2014   History of cardiac arrest    PEA (Pulseless electrical activity) ([place]) 06/07/2014   Degenerative arthritis of left knee 01/19/2014   Arthritis of left knee 01/19/2014   Unspecified constipation 03/31/2013   Difficulty in walking(719.7) 03/12/2013   Stiffness of right knee 03/12/2013   Right leg weakness 03/12/2013   Osteoarthritis of right knee 02/06/2013   LBBB (left bundle branch block) 01/07/2013   DM2 (diabetes mellitus, type 2) ([place]) 01/07/2013   Hypothyroidism 01/07/2013   Morbid obesity ([place]) 01/07/2013   Dyslipidemia 01/07/2013   11:16 AM, 09/17/20 M. Sherlyn Lees, PT, DPT Physical Therapist- [place] Office Number: [phone]   [facility] [address] [place], [place], [address] Phone: [phone]   Fax:  [phone]  Name: Rickey Rice MRN: [id number] Date of Birth: 1940/09/12

## 2020-09-21 ENCOUNTER — Ambulatory Visit (INDEPENDENT_AMBULATORY_CARE_PROVIDER_SITE_OTHER): Payer: Medicare PPO

## 2020-09-21 ENCOUNTER — Ambulatory Visit (HOSPITAL_COMMUNITY): Payer: Medicare PPO | Admitting: Physical Therapy

## 2020-09-21 ENCOUNTER — Other Ambulatory Visit: Payer: Self-pay

## 2020-09-21 DIAGNOSIS — Z95810 Presence of automatic (implantable) cardiac defibrillator: Secondary | ICD-10-CM | POA: Diagnosis not present

## 2020-09-21 DIAGNOSIS — R29898 Other symptoms and signs involving the musculoskeletal system: Secondary | ICD-10-CM

## 2020-09-21 DIAGNOSIS — R2689 Other abnormalities of gait and mobility: Secondary | ICD-10-CM | POA: Diagnosis not present

## 2020-09-21 DIAGNOSIS — I5022 Chronic systolic (congestive) heart failure: Secondary | ICD-10-CM

## 2020-09-21 NOTE — Therapy (Signed)
[place], [place], [address] Phone: [phone]   Fax:  [phone]  Physical Therapy Treatment  Patient Details  Name: Rickey Rice MRN: [id number] Date of Birth: April 22, 1940 Referring Provider (PT): Lucianne Lei MD   Encounter Date: 09/21/2020   PT End of Session - 09/21/20 0919     Visit Number 6    Number of Visits 8    Date for PT Re-Evaluation 09/29/20    Authorization Type [employer] Medicare approved 8 visits.    Authorization Time Period 6/8-09/29/20    Authorization - Visit Number 6    Authorization - Number of Visits 8    Progress Note Due on Visit 8    PT Start Time 0915   late arrival   PT Stop Time 0953    PT Time Calculation (min) 38 min    Activity Tolerance Patient tolerated treatment well    Behavior During Therapy [facility] for tasks assessed/performed             Past Medical History:  Diagnosis Date   AICD (automatic cardioverter/defibrillator) present    Arthritis    oa   Cardiac arrest ([place]) 06/07/2014   Primary VF arrest with successful resuscitation and s/p ICD implant   Diabetes mellitus without complication ([place])    type 2   Dyslipidemia    History of nuclear stress test 04/2012   lexiscan - 2 day protocol; low risk study, evidence of inferrior & apical scar but no ischemia    Hypertension    Hypothyroidism    LBBB (left bundle branch block)    Morbid obesity (HCC)    NICM (nonischemic cardiomyopathy) ([place])    Prostate enlargement    pt denies    Past Surgical History:  Procedure Laterality Date   BI-VENTRICULAR IMPLANTABLE CARDIOVERTER DEFIBRILLATOR N/A 06/18/2014   STJ CRTD implanted by Dr [name]   COLONOSCOPY W/ BIOPSIES     COLONOSCOPY WITH PROPOFOL N/A 04/05/2017   Procedure: COLONOSCOPY WITH PROPOFOL;  Surgeon: Juanita Craver, MD;  Location: [facility];  Service: Endoscopy;  Laterality: N/A;   FINGER SURGERY  1954   1 st joint  right hand amputated   KNEE ARTHROSCOPY  Left    LEFT HEART CATHETERIZATION WITH CORONARY ANGIOGRAM N/A 06/12/2014   Procedure: LEFT HEART CATHETERIZATION WITH CORONARY ANGIOGRAM;  Surgeon: Leonie Man, MD;  Location: [facility] CATH LAB;  Service: Cardiovascular;  Laterality: N/A;   [place]   TOTAL KNEE ARTHROPLASTY Right 02/05/2013   Procedure: TOTAL KNEE ARTHROPLASTY;  Surgeon: Kerin Salen, MD;  Location: [place];  Service: Orthopedics;  Laterality: Right;   TOTAL KNEE ARTHROPLASTY Left 01/19/2014   Procedure: LEFT TOTAL KNEE ARTHROPLASTY;  Surgeon: Kerin Salen, MD;  Location: [place];  Service: Orthopedics;  Laterality: Left;    There were no vitals filed for this visit.   Subjective Assessment - 09/21/20 0918     Subjective Doing well, no new issues. Has had some soreness from workouts. Has been busy past 2 days so has not completed walking program.    Patient Stated Goals improve strength and walking    Currently in Pain? No/denies                               [facility] Adult PT Treatment/Exercise - 09/21/20 0001       Knee/Hip Exercises: Standing   Heel Raises Both;20 reps  Knee Flexion Strengthening;Both;10 reps;3 sets    Knee Flexion Limitations 5#    Hip Flexion Stengthening;Both;10 reps;3 sets    Hip Flexion Limitations 5#    Hip Abduction Both;3 sets;10 reps    Abduction Limitations 5#    Hip Extension Stengthening;Both;10 reps;3 sets    Extension Limitations 5#    Forward Step Up Both;2 sets;10 reps;Hand Hold: 2;Step Height: 6"    Other Standing Knee Exercises sidestepping x 3 min in // bars 5 lbs ankle weights    Other Standing Knee Exercises tandem stance 3 x 20" int HHA      Knee/Hip Exercises: Seated   Long Arc Quad Strengthening;Both;3 sets;10 reps    Long Arc Quad Weight 5 lbs.    Other Seated Knee/Hip Exercises --    Sit to Sand 1 set;10 reps;without UE support                      PT Short Term Goals - 09/01/20 1129       PT SHORT TERM GOAL #1    Title Patient will be independent with HEP in order to improve functional outcomes.    Time 2    Period Weeks    Status New    Target Date 09/15/20      PT SHORT TERM GOAL #2   Title Patient will report at least 25% improvement in symptoms for improved quality of life.    Time 2    Period Weeks    Status New    Target Date 09/15/20               PT Long Term Goals - 09/01/20 1130       PT LONG TERM GOAL #1   Title Patient will report at least 75% improvement in symptoms for improved quality of life.    Time 4    Period Weeks    Status New    Target Date 09/29/20      PT LONG TERM GOAL #2   Title Patient will be able to ambulate at least 350 feet in 2MWT in order to demonstrate improved gait speed for community ambulation.    Time 4    Period Weeks    Status New    Target Date 09/29/20      PT LONG TERM GOAL #3   Title Patient will be able to complete 5x STS in under 15 seconds in order to reduce the risk of falls.    Time 4    Period Weeks    Status New    Target Date 09/29/20                   Plan - 09/21/20 0955     Clinical Impression Statement Patient tolerated session well today and shows good return with prior ther ex. Patient needing decreased rest breaks but does demo increased fatigue toward end of session. Added sit to stands for functional strength progressions. Patient will continue to benefit from skilled therapy services to reduce deficits and improve functional ability.    Personal Factors and Comorbidities Comorbidity 3+;Age;Social Background;Time since onset of injury/illness/exacerbation;Fitness    Comorbidities obesity, HTN, DM    Examination-Activity Limitations Stairs;Stand;Transfers;Squat;Locomotion Level;Lift    Examination-Participation Restrictions Lawyer;Shop;Community Activity;Cleaning;Meal Prep    Rehab Potential Good    PT Frequency 2x / week    PT Duration 4 weeks    PT Treatment/Interventions ADLs/Self Care  Home Management;Aquatic Therapy;Cryotherapy;Electrical Stimulation;Iontophoresis 4mg /ml Dexamethasone;Moist  Heat;Traction;Ultrasound;Parrafin;DME Instruction;Gait training;Stair training;Functional mobility training;Therapeutic activities;Therapeutic exercise;Balance training;Neuromuscular re-education;Patient/family education;Orthotic Fit/Training;Manual techniques;Passive range of motion;Dry needling;Energy conservation;Splinting;Taping;Spinal Manipulations;Joint Manipulations    PT Next Visit Plan Assure pt is completeing walking program as HEP.  continue functional strengthening, standing and table exercises, endurance training with walking/nu step, progress as able    PT Home Exercise Plan 6/8 STS; 6/14: bridge, S/L clam GTB, heel raises. 6/21: begin walking program from 3-5 minutes and increase time as able.    Consulted and Agree with Plan of Care Patient             Patient will benefit from skilled therapeutic intervention in order to improve the following deficits and impairments:  Abnormal gait, Decreased activity tolerance, Difficulty walking, Decreased strength, Pain, Obesity, Decreased mobility, Decreased balance, Improper body mechanics  Visit Diagnosis: Other abnormalities of gait and mobility  Other symptoms and signs involving the musculoskeletal system     Problem List Patient Active Problem List   Diagnosis Date Noted   Biventricular ICD (implantable cardioverter-defibrillator) in place 09/12/2020   OSA (obstructive sleep apnea) 12/19/2018   Salivary stone 02/01/2018   Impacted cerumen of both ears 05/16/2017   Sensorineural hearing loss (SNHL), bilateral 05/16/2017   Encounter for immunization 02/15/2016   VT (ventricular tachycardia) (HCC) 67/70/3403   Chronic systolic congestive heart failure ([place]) 09/24/2015   S/P implantation of automatic cardioverter/defibrillator (AICD) 07/26/2014   CKD (chronic kidney disease) stage 2, GFR 60-89 ml/min 06/11/2014   DM  type 2 causing renal disease ([place]) 06/11/2014   BPH (benign prostatic hypertrophy) 06/11/2014   Obesity, morbid ([place]) 06/11/2014   Essential hypertension    NICM (nonischemic cardiomyopathy) ([place]) 06/08/2014   History of cardiac arrest    PEA (Pulseless electrical activity) ([place]) 06/07/2014   Degenerative arthritis of left knee 01/19/2014   Arthritis of left knee 01/19/2014   Unspecified constipation 03/31/2013   Difficulty in walking(719.7) 03/12/2013   Stiffness of right knee 03/12/2013   Right leg weakness 03/12/2013   Osteoarthritis of right knee 02/06/2013   LBBB (left bundle branch block) 01/07/2013   DM2 (diabetes mellitus, type 2) ([place]) 01/07/2013   Hypothyroidism 01/07/2013   Morbid obesity ([place]) 01/07/2013   Dyslipidemia 01/07/2013   9:58 AM, 09/21/20 Josue Hector PT DPT  Physical Therapist with [facility]  [phone] [place], [place], [address] Phone: [phone]   Fax:  [phone]  Name: Rickey Rice MRN: [id number] Date of Birth: 27-Jun-1940

## 2020-09-22 NOTE — Progress Notes (Signed)
EPIC Encounter for ICM Monitoring  Patient Name: Rickey Rice is a 80 y.o. male Date: 09/22/2020 Primary Care Physican: Lucianne Lei, MD Primary Cardiologist: [name] Electrophysiologist: Vergie Living Pacing:  99%       09/22/2020 Office Weight: 310 lbs                                         Spoke with patient and heart failure questions reviewed.  Pt asymptomatic for fluid accumulation and feeing well.  He is going to PT for strengthening.   [place] Thoracic impedance suggesting normal fluid levels.   Prescribed: Furosemide 40 mg 1 tablet daily.   Labs: 07/08/2019 Creatinine 1.49, BUN 21, Potassium 4.4, Sodium 141, GFR 44-51 05/27/2019 Creatinine 1.36, BUN 18, Potassium 4.5, Sodium 145, GFR 49-57 A complete set of results can be found in Results Review.   Recommendations:  No changes and encouraged to call if experiencing any fluid symptoms.   Follow-up plan: ICM clinic phone appointment on 10/29/2020.   91 day device clinic remote transmission 10/28/2020.       EP/Cardiology Office Visits: Recalls for 07/05/2020 with Dr. Debara Pickett.   11/11/2020 with Dr [name].     Copy of ICM check sent to Dr. [name].   3 month ICM trend: 09/21/2020.    1 Year ICM trend:       Rosalene Billings, RN 09/22/2020 9:03 AM

## 2020-09-23 ENCOUNTER — Other Ambulatory Visit: Payer: Self-pay

## 2020-09-23 ENCOUNTER — Encounter (HOSPITAL_COMMUNITY): Payer: Self-pay | Admitting: Physical Therapy

## 2020-09-23 ENCOUNTER — Ambulatory Visit (HOSPITAL_COMMUNITY): Payer: Medicare PPO | Admitting: Physical Therapy

## 2020-09-23 DIAGNOSIS — R2689 Other abnormalities of gait and mobility: Secondary | ICD-10-CM

## 2020-09-23 DIAGNOSIS — R29898 Other symptoms and signs involving the musculoskeletal system: Secondary | ICD-10-CM | POA: Diagnosis not present

## 2020-09-23 NOTE — Patient Instructions (Signed)
Access Code: [email or web address] URL: [email or web address] Date: 09/23/2020 Prepared by: Mitzi Hansen Ellison Leisure  Exercises Forward Step Up with Counter Support - 1 x daily - 7 x weekly - 2 sets - 10 reps

## 2020-09-23 NOTE — Therapy (Signed)
[place], [place], [address] Phone: [phone]   Fax:  [phone]  Physical Therapy Treatment  Patient Details  Name: Rickey Rice MRN: [id number] Date of Birth: [birth year] Referring Provider (PT): Lucianne Lei MD   Encounter Date: 09/23/2020   PT End of Session - 09/23/20 0920     Visit Number 7    Number of Visits 8    Date for PT Re-Evaluation 09/29/20    Authorization Type [employer] Medicare approved 8 visits.    Authorization Time Period 6/8-09/29/20    Authorization - Visit Number 7    Authorization - Number of Visits 8    Progress Note Due on Visit 8    PT Start Time 0920    PT Stop Time 0958    PT Time Calculation (min) 38 min    Activity Tolerance Patient tolerated treatment well    Behavior During Therapy [facility] for tasks assessed/performed             Past Medical History:  Diagnosis Date   AICD (automatic cardioverter/defibrillator) present    Arthritis    oa   Cardiac arrest ([place]) 06/07/2014   Primary VF arrest with successful resuscitation and s/p ICD implant   Diabetes mellitus without complication ([place])    type 2   Dyslipidemia    History of nuclear stress test 04/2012   lexiscan - 2 day protocol; low risk study, evidence of inferrior & apical scar but no ischemia    Hypertension    Hypothyroidism    LBBB (left bundle branch block)    Morbid obesity (HCC)    NICM (nonischemic cardiomyopathy) ([place])    Prostate enlargement    pt denies    Past Surgical History:  Procedure Laterality Date   BI-VENTRICULAR IMPLANTABLE CARDIOVERTER DEFIBRILLATOR N/A 06/18/2014   STJ CRTD implanted by Dr [name]   COLONOSCOPY W/ BIOPSIES     COLONOSCOPY WITH PROPOFOL N/A 04/05/2017   Procedure: COLONOSCOPY WITH PROPOFOL;  Surgeon: Juanita Craver, MD;  Location: [facility];  Service: Endoscopy;  Laterality: N/A;   FINGER SURGERY  1954   1 st joint  right hand amputated   KNEE ARTHROSCOPY Left    LEFT  HEART CATHETERIZATION WITH CORONARY ANGIOGRAM N/A 06/12/2014   Procedure: LEFT HEART CATHETERIZATION WITH CORONARY ANGIOGRAM;  Surgeon: Leonie Man, MD;  Location: [facility] CATH LAB;  Service: Cardiovascular;  Laterality: N/A;   [place]   TOTAL KNEE ARTHROPLASTY Right 02/05/2013   Procedure: TOTAL KNEE ARTHROPLASTY;  Surgeon: Kerin Salen, MD;  Location: [place];  Service: Orthopedics;  Laterality: Right;   TOTAL KNEE ARTHROPLASTY Left 01/19/2014   Procedure: LEFT TOTAL KNEE ARTHROPLASTY;  Surgeon: Kerin Salen, MD;  Location: [place];  Service: Orthopedics;  Laterality: Left;    There were no vitals filed for this visit.   Subjective Assessment - 09/23/20 0920     Subjective His home exercises are going like 50%, does them half the time. 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 His muscles have been sore from working. Patient states 60-70% improvement with PT intervention. He can transfer easier but he knows he still needs to build up his legs. [facility]    Patient Stated Goals improve strength and walking    Currently in Pain? No/denies  [facility] PT Assessment - 09/23/20 0001       Assessment   Medical Diagnosis Pain in R Leg    Referring Provider (PT) Lucianne Lei MD    Onset Date/Surgical Date 02/25/20    Next MD Visit next week      Precautions   Precautions None      Restrictions   Weight Bearing Restrictions No      Balance Screen   Has the patient fallen in the past 6 months No    Has the patient had a decrease in activity level because of a fear of falling?  No    Is the patient reluctant to leave their home because of a fear of falling?  No      Prior Function   Level of Independence Independent    Vocation Retired      Charity fundraiser Status Within Functional Limits for tasks assessed      Observation/Other Assessments   Observations Ambulates with SPC    Focus on  Therapeutic Outcomes (FOTO)  n/a      Transfers   Five time sit to stand comments  21 without UE use      Ambulation/Gait   Ambulation/Gait Yes    Ambulation/Gait Assistance 6: Modified independent (Device/Increase time)    Ambulation Distance (Feet) 310 Feet    Assistive device Straight cane    Gait Pattern Trunk flexed                           [facility] Adult PT Treatment/Exercise - 09/23/20 0001       Knee/Hip Exercises: Aerobic   Nustep 5 minutes level 4      Knee/Hip Exercises: Standing   Heel Raises Both;20 reps    Hip Flexion Stengthening;Both;10 reps;3 sets    Hip Flexion Limitations 5#    Forward Step Up Both;2 sets;10 reps;Step Height: 6";Hand Hold: 1                    PT Education - 09/23/20 0920     Education Details HEP, reassessment findings, progress made    Person(s) Educated Patient    Methods Explanation;Demonstration    Comprehension Verbalized understanding;Returned demonstration              PT Short Term Goals - 09/23/20 0927       PT SHORT TERM GOAL #1   Title Patient will be independent with HEP in order to improve functional outcomes.    Time 2    Period Weeks    Status Achieved    Target Date 09/15/20      PT SHORT TERM GOAL #2   Title Patient will report at least 25% improvement in symptoms for improved quality of life.    Time 2    Period Weeks    Status Achieved    Target Date 09/15/20               PT Long Term Goals - 09/23/20 0951       PT LONG TERM GOAL #1   Title Patient will report at least 75% improvement in symptoms for improved quality of life.    Time 4    Period Weeks    Status On-going      PT LONG TERM GOAL #2   Title Patient will be able to ambulate at least 350 feet in 2MWT in order to demonstrate improved gait speed  for community ambulation.    Time 4    Period Weeks    Status On-going      PT LONG TERM GOAL #3   Title Patient will be able to complete 5x STS in under  15 seconds in order to reduce the risk of falls.    Time 4    Period Weeks    Status On-going                   Plan - 09/23/20 0920     Clinical Impression Statement Patient has met 2/2 short term goals with ability to complete HEP and improvement in symptoms. Patient has met 0/3 long term goals at this time. Remaining goals not met due to continued c/o weakness, decreased functional strength and gait speed/activity tolerance. Patient will begin exercising at [facility] soon and is educated on beginning walking program. Patient tolerates nustep today for improving endurance and activity tolerance. Patient able to complete step up with reduced UE support today with good mechanics. Anticipate D/c next session. Patient will continue to benefit from skilled physical therapy in order to reduce impairment and improve function.    Personal Factors and Comorbidities Comorbidity 3+;Age;Social Background;Time since onset of injury/illness/exacerbation;Fitness    Comorbidities obesity, HTN, DM    Examination-Activity Limitations Stairs;Stand;Transfers;Squat;Locomotion Level;Lift    Examination-Participation Restrictions Lawyer;Shop;Community Activity;Cleaning;Meal Prep    Rehab Potential Good    PT Frequency 2x / week    PT Duration 4 weeks    PT Treatment/Interventions ADLs/Self Care Home Management;Aquatic Therapy;Cryotherapy;Electrical Stimulation;Iontophoresis 57m/ml Dexamethasone;Moist Heat;Traction;Ultrasound;Parrafin;DME Instruction;Gait training;Stair training;Functional mobility training;Therapeutic activities;Therapeutic exercise;Balance training;Neuromuscular re-education;Patient/family education;Orthotic Fit/Training;Manual techniques;Passive range of motion;Dry needling;Energy conservation;Splinting;Taping;Spinal Manipulations;Joint Manipulations    PT Next Visit Plan Anticipate D/c, Assure pt is completeing walking program as HEP.  continue functional strengthening, standing and  table exercises, endurance training with walking/nu step, progress as able    PT Home Exercise Plan 6/8 STS; 6/14: bridge, S/L clam GTB, heel raises. 6/21: begin walking program from 3-5 minutes and increase time as able.    Consulted and Agree with Plan of Care Patient             Patient will benefit from skilled therapeutic intervention in order to improve the following deficits and impairments:  Abnormal gait, Decreased activity tolerance, Difficulty walking, Decreased strength, Pain, Obesity, Decreased mobility, Decreased balance, Improper body mechanics  Visit Diagnosis: Other abnormalities of gait and mobility  Other symptoms and signs involving the musculoskeletal system     Problem List Patient Active Problem List   Diagnosis Date Noted   Biventricular ICD (implantable cardioverter-defibrillator) in place 09/12/2020   OSA (obstructive sleep apnea) 12/19/2018   Salivary stone 02/01/2018   Impacted cerumen of both ears 05/16/2017   Sensorineural hearing loss (SNHL), bilateral 05/16/2017   Encounter for immunization 02/15/2016   VT (ventricular tachycardia) ([place] 045/80/9983  Chronic systolic congestive heart failure ([place] 09/24/2015   S/P implantation of automatic cardioverter/defibrillator (AICD) 07/26/2014   CKD (chronic kidney disease) stage 2, GFR 60-89 ml/min 06/11/2014   DM type 2 causing renal disease ([place] 06/11/2014   BPH (benign prostatic hypertrophy) 06/11/2014   Obesity, morbid ([place] 06/11/2014   Essential hypertension    NICM (nonischemic cardiomyopathy) ([place] 06/08/2014   History of cardiac arrest    PEA (Pulseless electrical activity) ([place] 06/07/2014   Degenerative arthritis of left knee 01/19/2014   Arthritis of left knee 01/19/2014   Unspecified constipation 03/31/2013   Difficulty in walking(719.7) 03/12/2013   Stiffness  of right knee 03/12/2013   Right leg weakness 03/12/2013   Osteoarthritis of right knee 02/06/2013   LBBB (left bundle branch  block) 01/07/2013   DM2 (diabetes mellitus, type 2) ([place]) 01/07/2013   Hypothyroidism 01/07/2013   Morbid obesity ([place]) 01/07/2013   Dyslipidemia 01/07/2013    10:01 AM, 09/23/20 Mearl Latin PT, DPT Physical Therapist at [place], [place], [address] Phone: [phone]   Fax:  [phone]  Name: SKY BORBOA MRN: [id number] Date of Birth: 1940-05-11

## 2020-09-28 ENCOUNTER — Other Ambulatory Visit: Payer: Self-pay

## 2020-09-28 ENCOUNTER — Ambulatory Visit (HOSPITAL_COMMUNITY): Payer: Medicare PPO | Attending: Family Medicine | Admitting: Physical Therapy

## 2020-09-28 DIAGNOSIS — R29898 Other symptoms and signs involving the musculoskeletal system: Secondary | ICD-10-CM

## 2020-09-28 DIAGNOSIS — R2689 Other abnormalities of gait and mobility: Secondary | ICD-10-CM

## 2020-09-28 NOTE — Therapy (Addendum)
[facility] [address] [place], [place], [address] Phone: [phone]   Fax:  [phone]  Physical Therapy Treatment/Discharge Summary  Patient Details  Name: Rickey Rice MRN: [id number] Date of Birth: 26-Jul-1940 Referring Provider (PT): Lucianne Lei MD   Encounter Date: 09/28/2020  PHYSICAL THERAPY DISCHARGE SUMMARY  Visits from Start of Care: 8  Current functional level related to goals / functional outcomes: See below   Remaining deficits: See below   Education / Equipment: See below   Patient agrees to discharge. Patient goals were partially met. Patient is being discharged due to meeting the stated rehab goals.  11:12 AM, 09/29/20 Mearl Latin PT, DPT Physical Therapist at [facility]    PT End of Session - 09/28/20 0955     Visit Number 8    Number of Visits 8    Date for PT Re-Evaluation 09/29/20    Authorization Type [employer] approved 8 visits.    Authorization Time Period 6/8-09/29/20    Authorization - Visit Number 8    Authorization - Number of Visits 8    Progress Note Due on Visit 8    PT Start Time 0915    PT Stop Time 1000    PT Time Calculation (min) 45 min    Activity Tolerance Patient tolerated treatment well    Behavior During Therapy [facility] for tasks assessed/performed             Past Medical History:  Diagnosis Date   AICD (automatic cardioverter/defibrillator) present    Arthritis    oa   Cardiac arrest ([place]) 06/07/2014   Primary VF arrest with successful resuscitation and s/p ICD implant   Diabetes mellitus without complication ([place])    type 2   Dyslipidemia    History of nuclear stress test 04/2012   lexiscan - 2 day protocol; low risk study, evidence of inferrior & apical scar but no ischemia    Hypertension    Hypothyroidism    LBBB (left bundle branch block)    Morbid obesity (HCC)    NICM (nonischemic cardiomyopathy) ([place])    Prostate  enlargement    pt denies    Past Surgical History:  Procedure Laterality Date   BI-VENTRICULAR IMPLANTABLE CARDIOVERTER DEFIBRILLATOR N/A 06/18/2014   STJ CRTD implanted by Dr [name]   COLONOSCOPY W/ BIOPSIES     COLONOSCOPY WITH PROPOFOL N/A 04/05/2017   Procedure: COLONOSCOPY WITH PROPOFOL;  Surgeon: Juanita Craver, MD;  [facility];  Service: Endoscopy;  Laterality: N/A;   FINGER SURGERY  1954   1 st joint  right hand amputated   KNEE ARTHROSCOPY Left    LEFT HEART CATHETERIZATION WITH CORONARY ANGIOGRAM N/A 06/12/2014   Procedure: LEFT HEART CATHETERIZATION WITH CORONARY ANGIOGRAM;  Surgeon: Leonie Man, MD;  [facility] C[facility] LAB;  Service: Cardiovascular;  Laterality: N/A;   [place]   TOTAL KNEE ARTHROPLASTY Right 02/05/2013   Procedure: TOTAL KNEE ARTHROPLASTY;  Surgeon: Kerin Salen, MD;  Location: [place];  Service: Orthopedics;  Laterality: Right;   TOTAL KNEE ARTHROPLASTY Left 01/19/2014   Procedure: LEFT TOTAL KNEE ARTHROPLASTY;  Surgeon: Kerin Salen, MD;  Location: [place];  Service: Orthopedics;  Laterality: Left;    There were no vitals filed for this visit.   Subjective Assessment - 09/28/20 1124     Subjective pt states he is doing well; intends on returning to [facility] soon; pleased with progress and exercises  he has been instructed with.    Currently in Pain? No/denies             Below test measures taken on 09/23/20   [facility] PT Assessment - 09/28/20 0954       Assessment   Medical Diagnosis Pain in R Leg    Referring Provider (PT) Lucianne Lei MD    Onset Date/Surgical Date 02/25/20    Next MD Visit next week      Precautions   Precautions None      Restrictions   Weight Bearing Restrictions No      Prior Function   Level of Independence Independent    Vocation Retired      Associate Professor   Overall Cognitive Status Within Functional Limits for tasks assessed      Observation/Other Assessments   Observations Ambulates with SPC     Focus on Therapeutic Outcomes (FOTO)  n/a      Transfers   Five time sit to stand comments  21 without UE use      Ambulation/Gait   Ambulation/Gait Yes    Ambulation/Gait Assistance 6: Modified independent (Device/Increase time)    Ambulation Distance (Feet) 310 Feet    Assistive device Straight cane    Gait Pattern Trunk flexed                           [facility] Adult PT Treatment/Exercise - 09/28/20 0001       Knee/Hip Exercises: Aerobic   Nustep 5 minutes level 4      Knee/Hip Exercises: Standing   Heel Raises Both;20 reps    Hip Flexion Stengthening;Both;10 reps;3 sets    Hip Flexion Limitations 5#    Hip Abduction Both;3 sets;10 reps    Abduction Limitations 5#    Hip Extension Stengthening;Both;10 reps;3 sets    Extension Limitations 5#    Lateral Step Up Both;2 sets;10 reps;Hand Hold: 1;Step Height: 6"    Forward Step Up Both;2 sets;10 reps;Step Height: 6";Hand Hold: 1                      PT Short Term Goals - 09/23/20 0927       PT SHORT TERM GOAL #1   Title Patient will be independent with HEP in order to improve functional outcomes.    Time 2    Period Weeks    Status Achieved    Target Date 09/15/20      PT SHORT TERM GOAL #2   Title Patient will report at least 25% improvement in symptoms for improved quality of life.    Time 2    Period Weeks    Status Achieved    Target Date 09/15/20               PT Long Term Goals - 09/23/20 0951       PT LONG TERM GOAL #1   Title Patient will report at least 75% improvement in symptoms for improved quality of life.    Time 4    Period Weeks    Status On-going      PT LONG TERM GOAL #2   Title Patient will be able to ambulate at least 350 feet in 2MWT in order to demonstrate improved gait speed for community ambulation.    Time 4    Period Weeks    Status On-going      PT LONG TERM GOAL #3  Title Patient will be able to complete 5x STS in under 15 seconds in order  to reduce the risk of falls.    Time 4    Period Weeks    Status On-going               Assessment Patient has met 2/2 short term goals with ability to complete HEP and improvement in symptoms. Patient has met 0/3 long term goals at this time. Remaining goals not met due to continued c/o weakness, decreased functional strength and gait speed/activity tolerance. Patient will be transitioning to HEP and joining [facility] for self management. Patient discharged from physical therapy at this time.  11:12 AM, 09/29/20 Mearl Latin PT, DPT Physical Therapist at [facility] - 09/28/20 0957     Clinical Impression Statement Pt re-evaluated last session (09/23/20) meeting all STG's and progressing towards unmet LTG's.  Pt is currently walking 30 minutes (approx 3/4 mile) and completing his HEP.  Pt intends on returning to [facility] as he was doing this regularly prior to [place].  pt feels confident to continue his HEP and increased activity independently at this time.  Reviewed HEP and educated on places he could purchase ankle weights.  pt with no other issues of concerns at this time.    Personal Factors and Comorbidities Comorbidity 3+;Age;Social Background;Time since onset of injury/illness/exacerbation;Fitness    Comorbidities obesity, HTN, DM    Examination-Activity Limitations Stairs;Stand;Transfers;Squat;Locomotion Level;Lift    Examination-Participation Restrictions Lawyer;Shop;Community Activity;Cleaning;Meal Prep    Rehab Potential Good    PT Frequency 2x / week    PT Duration 4 weeks    PT Treatment/Interventions ADLs/Self Care Home Management;Aquatic Therapy;Cryotherapy;Electrical Stimulation;Iontophoresis 42m/ml Dexamethasone;Moist Heat;Traction;Ultrasound;Parrafin;DME Instruction;Gait training;Stair training;Functional mobility training;Therapeutic activities;Therapeutic exercise;Balance training;Neuromuscular re-education;Patient/family  education;Orthotic Fit/Training;Manual techniques;Passive range of motion;Dry needling;Energy conservation;Splinting;Taping;Spinal Manipulations;Joint Manipulations    PT Next Visit Plan Discharge to HEP per PT POC and patient request.    PT Home Exercise Plan 6/8 STS; 6/14: bridge, S/L clam GTB, heel raises. 6/21: begin walking program from 3-5 minutes and increase time as able.    Consulted and Agree with Plan of Care Patient             Patient will benefit from skilled therapeutic intervention in order to improve the following deficits and impairments:  Abnormal gait, Decreased activity tolerance, Difficulty walking, Decreased strength, Pain, Obesity, Decreased mobility, Decreased balance, Improper body mechanics  Visit Diagnosis: Other abnormalities of gait and mobility  Other symptoms and signs involving the musculoskeletal system     Problem List Patient Active Problem List   Diagnosis Date Noted   Biventricular ICD (implantable cardioverter-defibrillator) in place 09/12/2020   OSA (obstructive sleep apnea) 12/19/2018   Salivary stone 02/01/2018   Impacted cerumen of both ears 05/16/2017   Sensorineural hearing loss (SNHL), bilateral 05/16/2017   Encounter for immunization 02/15/2016   VT (ventricular tachycardia) ([place] 065/99/3570  Chronic systolic congestive heart failure ([place] 09/24/2015   S/P implantation of automatic cardioverter/defibrillator (AICD) 07/26/2014   CKD (chronic kidney disease) stage 2, GFR 60-89 ml/min 06/11/2014   DM type 2 causing renal disease ([place] 06/11/2014   BPH (benign prostatic hypertrophy) 06/11/2014   Obesity, morbid ([place] 06/11/2014   Essential hypertension    NICM (nonischemic cardiomyopathy) ([place] 06/08/2014   History of cardiac arrest    PEA (Pulseless electrical activity) ([place] 06/07/2014   Degenerative arthritis of left knee 01/19/2014   Arthritis of left knee 01/19/2014  Unspecified constipation 03/31/2013   Difficulty in  walking(719.7) 03/12/2013   Stiffness of right knee 03/12/2013   Right leg weakness 03/12/2013   Osteoarthritis of right knee 02/06/2013   LBBB (left bundle branch block) 01/07/2013   DM2 (diabetes mellitus, type 2) ([place]) 01/07/2013   Hypothyroidism 01/07/2013   Morbid obesity ([place]) 01/07/2013   Dyslipidemia 01/07/2013   Teena Irani, PTA/CLT [phone]  Teena Irani 09/28/2020, 11:25 AM  [facility] [address] [place], [place], [address] Phone: [phone]   Fax:  [phone]  Name: JERON GRAHN MRN: [id number] Date of Birth: [birth year]

## 2020-09-29 ENCOUNTER — Encounter (HOSPITAL_COMMUNITY): Payer: Self-pay | Admitting: Physical Therapy

## 2020-09-29 DIAGNOSIS — G4733 Obstructive sleep apnea (adult) (pediatric): Secondary | ICD-10-CM | POA: Diagnosis not present

## 2020-09-30 ENCOUNTER — Ambulatory Visit (HOSPITAL_COMMUNITY): Payer: Medicare PPO | Admitting: Physical Therapy

## 2020-09-30 DIAGNOSIS — I5022 Chronic systolic (congestive) heart failure: Secondary | ICD-10-CM | POA: Diagnosis not present

## 2020-09-30 DIAGNOSIS — Z95810 Presence of automatic (implantable) cardiac defibrillator: Secondary | ICD-10-CM | POA: Diagnosis not present

## 2020-09-30 DIAGNOSIS — I10 Essential (primary) hypertension: Secondary | ICD-10-CM | POA: Diagnosis not present

## 2020-09-30 DIAGNOSIS — I428 Other cardiomyopathies: Secondary | ICD-10-CM | POA: Diagnosis not present

## 2020-09-30 DIAGNOSIS — E669 Obesity, unspecified: Secondary | ICD-10-CM | POA: Diagnosis not present

## 2020-09-30 DIAGNOSIS — E1169 Type 2 diabetes mellitus with other specified complication: Secondary | ICD-10-CM | POA: Diagnosis not present

## 2020-09-30 DIAGNOSIS — I472 Ventricular tachycardia: Secondary | ICD-10-CM | POA: Diagnosis not present

## 2020-10-07 DIAGNOSIS — H401222 Low-tension glaucoma, left eye, moderate stage: Secondary | ICD-10-CM | POA: Diagnosis not present

## 2020-10-07 DIAGNOSIS — Z961 Presence of intraocular lens: Secondary | ICD-10-CM | POA: Diagnosis not present

## 2020-10-07 DIAGNOSIS — H0102B Squamous blepharitis left eye, upper and lower eyelids: Secondary | ICD-10-CM | POA: Diagnosis not present

## 2020-10-07 DIAGNOSIS — H401211 Low-tension glaucoma, right eye, mild stage: Secondary | ICD-10-CM | POA: Diagnosis not present

## 2020-10-07 DIAGNOSIS — E119 Type 2 diabetes mellitus without complications: Secondary | ICD-10-CM | POA: Diagnosis not present

## 2020-10-07 DIAGNOSIS — H04123 Dry eye syndrome of bilateral lacrimal glands: Secondary | ICD-10-CM | POA: Diagnosis not present

## 2020-10-07 DIAGNOSIS — H35373 Puckering of macula, bilateral: Secondary | ICD-10-CM | POA: Diagnosis not present

## 2020-10-07 DIAGNOSIS — H35033 Hypertensive retinopathy, bilateral: Secondary | ICD-10-CM | POA: Diagnosis not present

## 2020-10-07 DIAGNOSIS — H0102A Squamous blepharitis right eye, upper and lower eyelids: Secondary | ICD-10-CM | POA: Diagnosis not present

## 2020-10-13 ENCOUNTER — Other Ambulatory Visit: Payer: Self-pay | Admitting: Internal Medicine

## 2020-10-18 DIAGNOSIS — Z20822 Contact with and (suspected) exposure to covid-19: Secondary | ICD-10-CM | POA: Diagnosis not present

## 2020-10-22 ENCOUNTER — Encounter: Payer: Self-pay | Admitting: Internal Medicine

## 2020-10-22 ENCOUNTER — Telehealth (INDEPENDENT_AMBULATORY_CARE_PROVIDER_SITE_OTHER): Payer: Medicare PPO | Admitting: Internal Medicine

## 2020-10-22 VITALS — BP 111/75 | HR 64 | Wt 312.0 lb

## 2020-10-22 DIAGNOSIS — E119 Type 2 diabetes mellitus without complications: Secondary | ICD-10-CM

## 2020-10-22 DIAGNOSIS — I5022 Chronic systolic (congestive) heart failure: Secondary | ICD-10-CM

## 2020-10-22 DIAGNOSIS — Z95810 Presence of automatic (implantable) cardiac defibrillator: Secondary | ICD-10-CM

## 2020-10-22 DIAGNOSIS — I428 Other cardiomyopathies: Secondary | ICD-10-CM | POA: Diagnosis not present

## 2020-10-22 DIAGNOSIS — Z7189 Other specified counseling: Secondary | ICD-10-CM

## 2020-10-22 NOTE — Progress Notes (Signed)
Virtual Visit via Video Note   This visit type was conducted due to national recommendations for restrictions regarding the COVID-19 Pandemic (e.g. social distancing) in an effort to limit this patient's exposure and mitigate transmission in our community.  Due to his co-morbid illnesses, this patient is at least at moderate risk for complications without adequate follow up.  This format is felt to be most appropriate for this patient at this time.  All issues noted in this document were discussed and addressed.  A limited physical exam was performed with this format.  Please refer to the patient's chart for his consent to telehealth for [facility].      Date:  10/22/2020   ID:  Rickey Rice, DOB [birth year], MRN [id number] The patient was identified using 2 identifiers.  Evaluation Performed:  Follow-Up Visit  Patient Location:  [place] [address]  Provider location:   [address] [place], [place] [address]  PCP:  Lucianne Lei, MD  Cardiologist:  Pixie Casino, MD Electrophysiologist:  None   Chief Complaint:  Being treated for COVID  History of Present Illness:    Rickey Rice is a 80 y.o. male who presents via audio/video conferencing for a telehealth visit today.  Mr. Sebo was seen today for video follow-up.  He says he is doing much better.  He was diagnosed with COVID-19 this past weekend.  Apparently he said he may have contracted it at church through the singing group.  Unfortunate by the time he recognized the symptoms several other family members including his wife have been infected.  He is on Paxlovid antiviral therapy and seems to be doing well.  He did not appear to be in any distress.  He does have a history of BiV AICD with LVEF last at 35 to 40%.  He recently saw Dr. [name] and was not noted to be having any ventricular arrhythmias.  Diabetes has been better controlled.  His hypertension is well as his dyslipidemia are  well controlled.  He has some recent heart failure symptoms.  Dr. [name] had increased his diuretic.  He has subsequently been started on Farxiga.  He is also on max dose of Entresto and Aldactone, as well as carvedilol.  He denies any worsening shortness of breath or chest pain.  The patient does have symptoms concerning for COVID-19 infection (fever, chills, cough, or new SHORTNESS OF BREATH).  He is on treatment with Paxlovid.   Prior CV studies:   The following studies were reviewed today:  Chart reviewed  PMHx:  Past Medical History:  Diagnosis Date   AICD (automatic cardioverter/defibrillator) present    Arthritis    oa   Cardiac arrest ([place]) 06/07/2014   Primary VF arrest with successful resuscitation and s/p ICD implant   Diabetes mellitus without complication ([place])    type 2   Dyslipidemia    History of nuclear stress test 04/2012   lexiscan - 2 day protocol; low risk study, evidence of inferrior & apical scar but no ischemia    Hypertension    Hypothyroidism    LBBB (left bundle branch block)    Morbid obesity (HCC)    NICM (nonischemic cardiomyopathy) ([place])    Prostate enlargement    pt denies    Past Surgical History:  Procedure Laterality Date   BI-VENTRICULAR IMPLANTABLE CARDIOVERTER DEFIBRILLATOR N/A 06/18/2014   STJ CRTD implanted by Dr [name]   COLONOSCOPY W/ BIOPSIES     COLONOSCOPY WITH  PROPOFOL N/A 04/05/2017   Procedure: COLONOSCOPY WITH PROPOFOL;  Surgeon: Juanita Craver, MD;  [facility];  Service: Endoscopy;  Laterality: N/A;   FINGER SURGERY  1954   1 st joint  right hand amputated   KNEE ARTHROSCOPY Left    LEFT HEART CATHETERIZATION WITH CORONARY ANGIOGRAM N/A 06/12/2014   Procedure: LEFT HEART CATHETERIZATION WITH CORONARY ANGIOGRAM;  Surgeon: Leonie Man, MD;  Location[facility] CATH LAB;  Service: Cardiovascular;  Laterality: N/A;   [place]   TOTAL KNEE ARTHROPLASTY Right 02/05/2013   Procedure: TOTAL KNEE ARTHROPLASTY;   Surgeon: Kerin Salen, MD;  Location: [facility];  Service: Orthopedics;  Laterality: Right;   TOTAL KNEE ARTHROPLASTY Left 01/19/2014   Procedure: LEFT TOTAL KNEE ARTHROPLASTY;  Surgeon: Kerin Salen, MD;  Location: [facility];  Service: Orthopedics;  Laterality: Left;    FAMHx:  Family History  Problem Relation Age of Onset   Cancer Mother    Kidney disease Brother    Hypertension Sister     SOCHx:   reports that he has never smoked. He has never used smokeless tobacco. He reports current alcohol use. He reports that he does not use drugs.  ALLERGIES:  Allergies  Allergen Reactions   Bee Venom Swelling    Swelling on lips and tongue   Demerol [Meperidine]     Not sure   Pioglitazone     Lips swelling= actos   Shrimp [Shellfish Allergy]     rash   Sulfa Antibiotics     Not sure    MEDS:  Current Meds  Medication Sig   acetaminophen (TYLENOL) 500 MG tablet Take 500 mg by mouth daily as needed for moderate pain or headache.   aspirin EC 81 MG tablet Take 81 mg by mouth daily.   brimonidine (ALPHAGAN) 0.2 % ophthalmic solution Place 1 drop into the left eye 3 (three) times daily.   carboxymethylcellulose (REFRESH PLUS) 0.5 % SOLN Place 1 drop into both eyes daily.   carvedilol (COREG) 12.5 MG tablet TAKE 1 TABLET BY MOUTH TWICE DAILY WITH A MEAL   dapagliflozin propanediol (FARXIGA) 10 MG TABS tablet Take 10 mg by mouth daily.   ENTRESTO 97-103 MG TAKE 1 TABLET BY MOUTH TWICE DAILY   furosemide (LASIX) 40 MG tablet TAKE 1 TABLET BY MOUTH EVERY DAY   glucose blood test strip USE AS DIRECTED TO CHECK BLOOD SUGAR BID   levothyroxine (SYNTHROID, LEVOTHROID) 75 MCG tablet Take 75 mcg by mouth every evening. Monday through Saturday   Multiple Vitamins-Minerals (ICAPS AREDS 2) CAPS Take 1 capsule by mouth 2 (two) times daily.   Omega-3 Fatty Acids (OMEGA 3 PO) Take 1,600 mg by mouth in the morning and at bedtime.   rosuvastatin (CRESTOR) 10 MG tablet Take 1 tablet (10 mg total) by mouth  daily at 6 PM.   saxagliptin HCl (ONGLYZA) 5 MG TABS tablet Take 5 mg by mouth daily.   spironolactone (ALDACTONE) 25 MG tablet TAKE 1/2 TABLET(12.5 MG) BY MOUTH DAILY   TURMERIC CURCUMIN PO Take 1 tablet by mouth daily.     ROS: Pertinent items noted in HPI and remainder of comprehensive ROS otherwise negative.  Labs/Other Tests and Data Reviewed:    Recent Labs: No results found for requested labs within last 8760 hours.   Recent Lipid Panel Lab Results  Component Value Date/Time   TRIG 68 06/10/2014 04:00 AM    Wt Readings from Last 3 Encounters:  10/22/20 (!) 312 lb (  141.5 kg)  09/13/20 (!) 314 lb 9.6 oz (142.7 kg)  01/07/20 (!) 312 lb (141.5 kg)     Exam:    Vital Signs:  BP 111/75   Pulse 64   Wt (!) 312 lb (141.5 kg)   SpO2 97%   BMI 42.91 kg/m    General appearance: alert and no distress Lungs: No visual respiratory difficulty Abdomen: Morbidly obese Extremities: extremities normal, atraumatic, no cyanosis or edema Skin: Skin color, texture, turgor normal. No rashes or lesions Neurologic: Grossly normal Psych: Pleasant  ASSESSMENT & PLAN:    Status post aborted sudden cardiac death-status post placement of a [employer] Bi-V AICD (LV lead is disabled due to dysfunction) Recent VT/VF with appropriate AICD shock Negative nuclear stress test for ischemia, fixed inferoapical defect suggesting possible scar versus artifact, EF 49% Diabetes type 2 on oral medications - A1c 5.8 Hypertension-well controlled Dyslipidemia on statin - well controlled Non-ischemic cardiomyopathy-EF 20-25%, improved to 35 to 40% (04/2019) OSA - not currently on therapy  Mr. Springborn unfortunately recently contracted COVID-40 and therefore were doing a video visit.  He is on antiviral therapy and seems to be doing well.  He has not had any recent abnormalities on his defibrillator.  No shocks.  He saw Dr. [name] and was felt to be somewhat volume overloaded and given some additional Lasix.   Diabetes has been well controlled as well as hypertension and dyslipidemia.  Weight is fairly stable if not down a little.  In general he seems to be doing well from a cardiac standpoint.  He will probably need a repeat echo next year or sooner if symptoms dictate.  COVID-19 Education: The signs and symptoms of COVID-19 were discussed with the patient and how to seek care for testing (follow up with PCP or arrange E-visit).  The importance of social distancing was discussed today.  Patient Risk:   After full review of this patients clinical status, I feel that they are at least moderate risk at this time.  Time:   Today, I have spent 25 minutes with the patient with telehealth technology discussing heart failure, COVID-19, AICD.     Medication Adjustments/Labs and Tests Ordered: Current medicines are reviewed at length with the patient today.  Concerns regarding medicines are outlined above.   Tests Ordered: No orders of the defined types were placed in this encounter.   Medication Changes: No orders of the defined types were placed in this encounter.   Disposition:  in 6 month(s)  Pixie Casino, MD, [facility], [place] Director of the Advanced Lipid Disorders &  Cardiovascular Risk Reduction Clinic Diplomate of the American Board of Clinical Lipidology Attending Cardiologist  Direct Dial: [phone]  Fax: [phone]  Website:  [email or web address]  Pixie Casino, MD  10/22/2020 10:07 AM

## 2020-10-22 NOTE — Patient Instructions (Signed)

## 2020-10-28 ENCOUNTER — Ambulatory Visit (INDEPENDENT_AMBULATORY_CARE_PROVIDER_SITE_OTHER): Payer: Medicare PPO

## 2020-10-28 DIAGNOSIS — I5022 Chronic systolic (congestive) heart failure: Secondary | ICD-10-CM

## 2020-10-29 ENCOUNTER — Ambulatory Visit (INDEPENDENT_AMBULATORY_CARE_PROVIDER_SITE_OTHER): Payer: Medicare PPO

## 2020-10-29 DIAGNOSIS — Z95810 Presence of automatic (implantable) cardiac defibrillator: Secondary | ICD-10-CM | POA: Diagnosis not present

## 2020-10-29 DIAGNOSIS — I5022 Chronic systolic (congestive) heart failure: Secondary | ICD-10-CM

## 2020-10-29 NOTE — Progress Notes (Signed)
EPIC Encounter for ICM Monitoring  Patient Name: Rickey Rice is a 80 y.o. male Date: 10/29/2020 Primary Care Physican: Lucianne Lei, MD Primary Cardiologist: [name] Electrophysiologist: Vergie Living Pacing:  99%       09/22/2020 Office Weight: 310 lbs                                         Spoke with patient and heart failure questions reviewed.  Pt asymptomatic for dehydration and reviewed symptoms.  He recently had COVID but doing fine. Out walking for exercise today.   Explained to drink extra fluids when outside in the heat.      [facility] Thoracic impedance suggesting normal fluid levels.   Prescribed: Furosemide 40 mg 1 tablet daily.   Labs: 07/08/2019 Creatinine 1.49, BUN 21, Potassium 4.4, Sodium 141, GFR 44-51 05/27/2019 Creatinine 1.36, BUN 18, Potassium 4.5, Sodium 145, GFR 49-57 A complete set of results can be found in Results Review.   Recommendations: Advised to increase fluid intake for a couple of days to balance fluid levels.   Follow-up plan: ICM clinic phone appointment on 11/08/2020 to recheck fluid levels.   91 day device clinic remote transmission 01/27/2021.       EP/Cardiology Office Visits: Recalls for 07/05/2020 with Dr. Debara Pickett.   11/11/2020 with Dr [name].     Copy of ICM check sent to Dr. [name].   3 month ICM trend: 10/29/2020.    1 Year ICM trend:       Rosalene Billings, RN 10/29/2020 5:01 PM

## 2020-11-01 LAB — CUP PACEART REMOTE DEVICE CHECK
Battery Remaining Longevity: 23 mo
Battery Remaining Longevity: 23 mo
Battery Remaining Percentage: 25 %
Battery Remaining Percentage: 25 %
Battery Voltage: 2.92 V
Battery Voltage: 2.92 V
Brady Statistic AP VP Percent: 1 %
Brady Statistic AP VP Percent: 1 %
Brady Statistic AP VS Percent: 1 %
Brady Statistic AP VS Percent: 1 %
Brady Statistic AS VP Percent: 99 %
Brady Statistic AS VP Percent: 99 %
Brady Statistic AS VS Percent: 1 %
Brady Statistic AS VS Percent: 1 %
Brady Statistic RA Percent Paced: 1 %
Brady Statistic RA Percent Paced: 1 %
Date Time Interrogation Session: 20220805070258
Date Time Interrogation Session: 20220805091844
HighPow Impedance: 73 Ohm
HighPow Impedance: 73 Ohm
HighPow Impedance: 64 Ohm
HighPow Impedance: 73 Ohm
Implantable Lead Implant Date: 20160324
Implantable Lead Implant Date: 20160324
Implantable Lead Implant Date: 20160324
Implantable Lead Implant Date: 20160324
Implantable Lead Implant Date: 20160324
Implantable Lead Implant Date: 20160324
Implantable Lead Location: 753858
Implantable Lead Location: 753859
Implantable Lead Location: 753860
Implantable Lead Location: 753858
Implantable Lead Location: 753859
Implantable Lead Location: 753860
Implantable Lead Model: 7122
Implantable Lead Model: 7122
Implantable Pulse Generator Implant Date: 20160324
Implantable Pulse Generator Implant Date: 20160324
Lead Channel Impedance Value: 390 Ohm
Lead Channel Impedance Value: 480 Ohm
Lead Channel Impedance Value: 550 Ohm
Lead Channel Impedance Value: 390 Ohm
Lead Channel Impedance Value: 480 Ohm
Lead Channel Impedance Value: 550 Ohm
Lead Channel Pacing Threshold Amplitude: 1 V
Lead Channel Pacing Threshold Amplitude: 1 V
Lead Channel Pacing Threshold Amplitude: 1.25 V
Lead Channel Pacing Threshold Amplitude: 1 V
Lead Channel Pacing Threshold Amplitude: 1 V
Lead Channel Pacing Threshold Amplitude: 1.25 V
Lead Channel Pacing Threshold Pulse Width: 0.5 ms
Lead Channel Pacing Threshold Pulse Width: 0.5 ms
Lead Channel Pacing Threshold Pulse Width: 0.5 ms
Lead Channel Pacing Threshold Pulse Width: 0.5 ms
Lead Channel Pacing Threshold Pulse Width: 0.5 ms
Lead Channel Pacing Threshold Pulse Width: 0.5 ms
Lead Channel Sensing Intrinsic Amplitude: 12 mV
Lead Channel Sensing Intrinsic Amplitude: 3.1 mV
Lead Channel Sensing Intrinsic Amplitude: 12 mV
Lead Channel Sensing Intrinsic Amplitude: 3.1 mV
Lead Channel Setting Pacing Amplitude: 1.5 V
Lead Channel Setting Pacing Amplitude: 2 V
Lead Channel Setting Pacing Amplitude: 2.5 V
Lead Channel Setting Pacing Amplitude: 1.5 V
Lead Channel Setting Pacing Amplitude: 2 V
Lead Channel Setting Pacing Amplitude: 2.5 V
Lead Channel Setting Pacing Pulse Width: 0.5 ms
Lead Channel Setting Pacing Pulse Width: 0.5 ms
Lead Channel Setting Pacing Pulse Width: 0.5 ms
Lead Channel Setting Pacing Pulse Width: 0.5 ms
Lead Channel Setting Sensing Sensitivity: 0.5 mV
Lead Channel Setting Sensing Sensitivity: 0.5 mV
Pulse Gen Serial Number: 7240350
Pulse Gen Serial Number: 7240350

## 2020-11-08 ENCOUNTER — Ambulatory Visit (INDEPENDENT_AMBULATORY_CARE_PROVIDER_SITE_OTHER): Payer: Medicare PPO

## 2020-11-08 DIAGNOSIS — Z95810 Presence of automatic (implantable) cardiac defibrillator: Secondary | ICD-10-CM

## 2020-11-08 DIAGNOSIS — I5022 Chronic systolic (congestive) heart failure: Secondary | ICD-10-CM | POA: Diagnosis not present

## 2020-11-08 NOTE — Progress Notes (Signed)
EPIC Encounter for ICM Monitoring  Patient Name: Rickey Rice is a 80 y.o. male Date: 11/08/2020 Primary Care Physican: Lucianne Lei, MD Primary Cardiologist: [name] Electrophysiologist: Vergie Living Pacing:  99%       11/08/2020 Weight: 304 lbs                                         Spoke with patient and heart failure questions reviewed. He denies fluid symptoms and reports decrease in urine output on Saturday.  He will discuss urine output with Dr [name] at 8/18 OV.    [facility] Thoracic impedance suggesting fluid levels went from possible fluid accumulation starting 11/02/2020.   Prescribed: Furosemide 40 mg 1 tablet daily.   Labs: 07/08/2019 Creatinine 1.49, BUN 21, Potassium 4.4, Sodium 141, GFR 44-51 05/27/2019 Creatinine 1.36, BUN 18, Potassium 4.5, Sodium 145, GFR 49-57 A complete set of results can be found in Results Review.   Recommendations: Advised to balance fluid intake and limit to 64 oz daily.  Any recommendations will be given at 8/18 OV with Dr [name].   Follow-up plan: ICM clinic phone appointment on 11/30/2020.   91 day device clinic remote transmission 01/27/2021.       EP/Cardiology Office Visits: Recalls for 07/05/2020 with Dr. Debara Pickett.   11/11/2020 with Dr [name].     Copy of ICM check sent to Dr. [name].    3 month ICM trend: 11/08/2020.    1 Year ICM trend:       Rosalene Billings, RN 11/08/2020 12:48 PM

## 2020-11-10 DIAGNOSIS — Z8616 Personal history of COVID-19: Secondary | ICD-10-CM | POA: Diagnosis not present

## 2020-11-10 DIAGNOSIS — Z6841 Body Mass Index (BMI) 40.0 and over, adult: Secondary | ICD-10-CM | POA: Diagnosis not present

## 2020-11-10 DIAGNOSIS — E669 Obesity, unspecified: Secondary | ICD-10-CM | POA: Diagnosis not present

## 2020-11-10 DIAGNOSIS — I10 Essential (primary) hypertension: Secondary | ICD-10-CM | POA: Diagnosis not present

## 2020-11-10 DIAGNOSIS — I429 Cardiomyopathy, unspecified: Secondary | ICD-10-CM | POA: Diagnosis not present

## 2020-11-10 DIAGNOSIS — E6609 Other obesity due to excess calories: Secondary | ICD-10-CM | POA: Diagnosis not present

## 2020-11-10 DIAGNOSIS — E1169 Type 2 diabetes mellitus with other specified complication: Secondary | ICD-10-CM | POA: Diagnosis not present

## 2020-11-11 ENCOUNTER — Ambulatory Visit: Payer: Medicare PPO | Admitting: Internal Medicine

## 2020-11-11 ENCOUNTER — Other Ambulatory Visit: Payer: Self-pay

## 2020-11-11 VITALS — BP 98/62 | HR 57 | Ht 71.5 in | Wt 301.0 lb

## 2020-11-11 DIAGNOSIS — Z95810 Presence of automatic (implantable) cardiac defibrillator: Secondary | ICD-10-CM

## 2020-11-11 DIAGNOSIS — I472 Ventricular tachycardia, unspecified: Secondary | ICD-10-CM

## 2020-11-11 DIAGNOSIS — I428 Other cardiomyopathies: Secondary | ICD-10-CM

## 2020-11-11 DIAGNOSIS — I5022 Chronic systolic (congestive) heart failure: Secondary | ICD-10-CM

## 2020-11-11 NOTE — Patient Instructions (Signed)
Medication Instructions:   ** Increase your Furosemide from '40mg'$  daily to '80mg'$  - 2 tablets by mouth daily x 2 days then resume your normal dosing.  *If you need a refill on your cardiac medications before your next appointment, please call your pharmacy*   Lab Work: None ordered.  If you have labs (blood work) drawn today and your tests are completely normal, you will receive your results only by: [place] (if you have MyChart) OR A paper copy in the mail If you have any lab test that is abnormal or we need to change your treatment, we will call you to review the results.   Testing/Procedures: None ordered.    Follow-Up: At [facility], you and your health needs are our priority.  As part of our continuing mission to provide you with exceptional heart care, we have created designated Provider Care Teams.  These Care Teams include your primary Cardiologist (physician) and Advanced Practice Providers (APPs -  Physician Assistants and Nurse Practitioners) who all work together to provide you with the care you need, when you need it.  We recommend signing up for the patient portal called "MyChart".  Sign up information is provided on this After Visit Summary.  MyChart is used to connect with patients for Virtual Visits (Telemedicine).  Patients are able to view lab/test results, encounter notes, upcoming appointments, etc.  Non-urgent messages can be sent to your provider as well.   To learn more about what you can do with MyChart, go to [email or web address].    Your next appointment:   12 month(s)  The format for your next appointment:   In Person  Provider:   You will see one of the following Advanced Practice Providers on your designated Care Team:   Tommye Standard, [place] "[facility]" [place], [place]

## 2020-11-11 NOTE — Progress Notes (Signed)
Error      Patient Care Team: Lucianne Lei, MD as PCP - General (Family Medicine)   HPI  Rickey Rice is a 80 y.o. male Seen in follow-up for CRT-D implantation for aborted cardiac arrest in the setting of nonischemic cardiac myopathy and left bundle branch block.  Because of a short RV LV interval, it was ELECTED NOT to activate the LV leads  initially. We subsequently have  turned on the LV lead     Seen 6/22.  Volume overloaded.  Increase his diuretics.  Analysis PCP to consider an SGLT2  The patient denies chest pain, nocturnal dyspnea .  There have been no palpitations, lightheadedness or syncope.  Complains of dyspnea on exertion and mild peripheral edema.  These are stable.  He has been followed closely by ICM clinic..   Date Cr K Hgb  11/17  1.24 4.2   5/22  1.16 4.1 11.4    DATE TEST EF   3/16 LHC  No obstructive CAD  3/16    Echo  40 %   8/17    Echo  30-35 %   2/21 Echo  34-40%          Has sleep disordered breathing now on CPAP  Past Medical History:  Diagnosis Date   AICD (automatic cardioverter/defibrillator) present    Arthritis    oa   Cardiac arrest ([place]) 06/07/2014   Primary VF arrest with successful resuscitation and s/p ICD implant   Diabetes mellitus without complication ([place])    type 2   Dyslipidemia    History of nuclear stress test 04/2012   lexiscan - 2 day protocol; low risk study, evidence of inferrior & apical scar but no ischemia    Hypertension    Hypothyroidism    LBBB (left bundle branch block)    Morbid obesity (HCC)    NICM (nonischemic cardiomyopathy) ([place])    Prostate enlargement    pt denies    Past Surgical History:  Procedure Laterality Date   BI-VENTRICULAR IMPLANTABLE CARDIOVERTER DEFIBRILLATOR N/A 06/18/2014   STJ CRTD implanted by Dr [name]   COLONOSCOPY W/ BIOPSIES     COLONOSCOPY WITH PROPOFOL N/A 04/05/2017   Procedure: COLONOSCOPY WITH PROPOFOL;  Surgeon: Juanita Craver, MD;  Location: [facility];  Service:  Endoscopy;  Laterality: N/A;   FINGER SURGERY  1954   1 st joint  right hand amputated   KNEE ARTHROSCOPY Left    LEFT HEART CATHETERIZATION WITH CORONARY ANGIOGRAM N/A 06/12/2014   Procedure: LEFT HEART CATHETERIZATION WITH CORONARY ANGIOGRAM;  Surgeon: Leonie Man, MD;  Location: [facility] CATH LAB;  Service: Cardiovascular;  Laterality: N/A;   [place]   TOTAL KNEE ARTHROPLASTY Right 02/05/2013   Procedure: TOTAL KNEE ARTHROPLASTY;  Surgeon: Kerin Salen, MD;  Location: [place];  Service: Orthopedics;  Laterality: Right;   TOTAL KNEE ARTHROPLASTY Left 01/19/2014   Procedure: LEFT TOTAL KNEE ARTHROPLASTY;  Surgeon: Kerin Salen, MD;  Location: [place];  Service: Orthopedics;  Laterality: Left;    Current Outpatient Medications:    acetaminophen (TYLENOL) 500 MG tablet, Take 500 mg by mouth daily as needed for moderate pain or headache., Disp: , Rfl:    aspirin EC 81 MG tablet, Take 81 mg by mouth daily., Disp: , Rfl:    brimonidine (ALPHAGAN) 0.2 % ophthalmic solution, Place 1 drop into the left eye 3 (three) times daily., Disp: , Rfl:    carboxymethylcellulose (REFRESH PLUS) 0.5 % SOLN, Place 1  drop into both eyes daily., Disp: , Rfl:    carvedilol (COREG) 12.5 MG tablet, TAKE 1 TABLET BY MOUTH TWICE DAILY WITH A MEAL, Disp: 180 tablet, Rfl: 3   dapagliflozin propanediol (FARXIGA) 10 MG TABS tablet, Take 10 mg by mouth daily., Disp: , Rfl:    ENTRESTO 97-103 MG, TAKE 1 TABLET BY MOUTH TWICE DAILY, Disp: 60 tablet, Rfl: 9   furosemide (LASIX) 40 MG tablet, TAKE 1 TABLET BY MOUTH EVERY DAY, Disp: 90 tablet, Rfl: 3   glucose blood test strip, USE AS DIRECTED TO CHECK BLOOD SUGAR BID, Disp: , Rfl:    levothyroxine (SYNTHROID, LEVOTHROID) 75 MCG tablet, Take 75 mcg by mouth every evening. Monday through Saturday, Disp: , Rfl:    Multiple Vitamins-Minerals (ICAPS AREDS 2) CAPS, Take 1 capsule by mouth 2 (two) times daily., Disp: , Rfl:    Omega-3 Fatty Acids (OMEGA 3 PO), Take 1,600 mg by  mouth in the morning and at bedtime., Disp: , Rfl:    rosuvastatin (CRESTOR) 10 MG tablet, Take 1 tablet (10 mg total) by mouth daily at 6 PM., Disp: 30 tablet, Rfl: 0   saxagliptin HCl (ONGLYZA) 5 MG TABS tablet, Take 5 mg by mouth daily., Disp: , Rfl:    spironolactone (ALDACTONE) 25 MG tablet, TAKE 1/2 TABLET(12.5 MG) BY MOUTH DAILY, Disp: 45 tablet, Rfl: 3   TURMERIC CURCUMIN PO, Take 1 tablet by mouth daily., Disp: , Rfl:  Discussed patient MONITOR  FOR Allergies  Allergen Reactions   Bee Venom Swelling    Swelling on lips and tongue   Demerol [Meperidine]     Not sure   Pioglitazone     Lips swelling= actos   Shrimp [Shellfish Allergy]     rash   Sulfa Antibiotics     Not sure      Review of Systems negative except from HPI and PMH  Physical Exam BP 98/62   Pulse (!) 57   Ht 5' 11.5" (1.816 m)   Wt (!) 301 lb (136.5 kg)   SpO2 95%   BMI 41.40 kg/m   Well developed and well nourished in no acute distress HENT normal Neck supple with JVP-8-10  clear Device pocket well healed; without hematoma or erythema.  There is no tethering  Regular rate and rhythm, no gallop No murmur Abd-soft with active BS No Clubbing cyanosis 1+ edema Skin-warm and dry A & Oriented  Grossly normal sensory and motor function  ECG sinus with P synchronous pacing at 57 Q-wave lead V1 and RS in lead V1  ECG #1 demonstrated left bundle branch block. It turned out this was associated with sub threshold left ventricular pacing.   ECG #2 with of increased output on the LV lead demonstrated a negative QRS in lead 1 and a biphasic QRS in lead V1   ECG #3 to demonstrated negative QRS in lead 1 and upright QRS in lead V1  ECG 6/19 sinus P synchronous pacing at 65 Interval 16/18/47 Negative QRS lead I and upright QRS lead V1   Assessment and  Plan  NICM  Aborted cardiac Arrest  VT-polymorphic /syncope   CRT-D implant  Hypertension  CHF  chronic-systolic/diastolic  Depression  Sleep disordered breathing   Mildly volume overloaded.  Again discussed fluid restrictions and will follow up with ICM about 2 L.  We will increase the furosemide from 40--80 x 2 days.  He is tolerating the recent introduction of Farxiga; we will continue it at 10 mg a day.  Continue Entresto 97/30 carvedilol 12.5 twice daily and Aldactone 12.5 daily.  No intercurrent ventricular arrhythmias.  Continue carvedilol.   n

## 2020-11-19 NOTE — Progress Notes (Signed)
Remote ICD transmission.   

## 2020-11-21 ENCOUNTER — Other Ambulatory Visit: Payer: Self-pay | Admitting: Physician Assistant

## 2020-11-30 ENCOUNTER — Ambulatory Visit (INDEPENDENT_AMBULATORY_CARE_PROVIDER_SITE_OTHER): Payer: Medicare PPO

## 2020-11-30 DIAGNOSIS — Z471 Aftercare following joint replacement surgery: Secondary | ICD-10-CM | POA: Diagnosis not present

## 2020-11-30 DIAGNOSIS — Z96653 Presence of artificial knee joint, bilateral: Secondary | ICD-10-CM | POA: Diagnosis not present

## 2020-11-30 DIAGNOSIS — I5022 Chronic systolic (congestive) heart failure: Secondary | ICD-10-CM

## 2020-11-30 DIAGNOSIS — Z95810 Presence of automatic (implantable) cardiac defibrillator: Secondary | ICD-10-CM

## 2020-12-01 NOTE — Progress Notes (Signed)
EPIC Encounter for ICM Monitoring  Patient Name: Rickey Rice is a 80 y.o. male Date: 12/01/2020 Primary Care Physican: Lucianne Lei, MD Primary Cardiologist: [name] Electrophysiologist: Vergie Living Pacing:  >99%       12/01/2020 Weight: 298 lbs                                         Spoke with patient and heart failure questions reviewed.  Pt asymptomatic for fluid accumulation and feeling well.   Corvue Thoracic impedance suggesting normal fluid levels.   Prescribed: Furosemide 40 mg 1 tablet daily.   Labs: 07/08/2019 Creatinine 1.49, BUN 21, Potassium 4.4, Sodium 141, GFR 44-51 05/27/2019 Creatinine 1.36, BUN 18, Potassium 4.5, Sodium 145, GFR 49-57 A complete set of results can be found in Results Review.   Recommendations:  No changes and encouraged to call if experiencing any fluid symptoms.   Follow-up plan: ICM clinic phone appointment on 01/03/2021.  91 day device clinic remote transmission 01/27/2021.       EP/Cardiology Office Visits: 05/18/2021 with Dr. Debara Pickett.   11/11/2021 with Dr [name].     Copy of ICM check sent to Dr. [name].     Trend ICM trend: 11/27/2020.    Rosalene Billings, RN 12/01/2020 10:23 AM

## 2020-12-23 DIAGNOSIS — G4733 Obstructive sleep apnea (adult) (pediatric): Secondary | ICD-10-CM | POA: Diagnosis not present

## 2020-12-23 DIAGNOSIS — H409 Unspecified glaucoma: Secondary | ICD-10-CM | POA: Diagnosis not present

## 2020-12-23 DIAGNOSIS — Z20822 Contact with and (suspected) exposure to covid-19: Secondary | ICD-10-CM | POA: Diagnosis not present

## 2020-12-23 DIAGNOSIS — E119 Type 2 diabetes mellitus without complications: Secondary | ICD-10-CM | POA: Diagnosis not present

## 2020-12-23 DIAGNOSIS — E039 Hypothyroidism, unspecified: Secondary | ICD-10-CM | POA: Diagnosis not present

## 2020-12-23 DIAGNOSIS — I509 Heart failure, unspecified: Secondary | ICD-10-CM | POA: Diagnosis not present

## 2020-12-23 DIAGNOSIS — I110 Hypertensive heart disease with heart failure: Secondary | ICD-10-CM | POA: Diagnosis not present

## 2020-12-23 DIAGNOSIS — E785 Hyperlipidemia, unspecified: Secondary | ICD-10-CM | POA: Diagnosis not present

## 2020-12-29 DIAGNOSIS — G4733 Obstructive sleep apnea (adult) (pediatric): Secondary | ICD-10-CM | POA: Diagnosis not present

## 2021-01-03 ENCOUNTER — Ambulatory Visit (INDEPENDENT_AMBULATORY_CARE_PROVIDER_SITE_OTHER): Payer: Medicare PPO

## 2021-01-03 DIAGNOSIS — I5022 Chronic systolic (congestive) heart failure: Secondary | ICD-10-CM | POA: Diagnosis not present

## 2021-01-03 DIAGNOSIS — Z95810 Presence of automatic (implantable) cardiac defibrillator: Secondary | ICD-10-CM

## 2021-01-03 NOTE — Progress Notes (Signed)
EPIC Encounter for ICM Monitoring  Patient Name: Rickey Rice is a 80 y.o. male Date: 01/03/2021 Primary Care Physican: Lucianne Lei, MD Primary Cardiologist: [name] Electrophysiologist: Vergie Living Pacing:  >99%       12/01/2020 Weight: 298 lbs 01/03/2021 Weight:                                          Spoke with patient and heart failure questions reviewed.  Pt asymptomatic for fluid accumulation and feeling well.     Corvue Thoracic impedance suggesting possible fluid accumulation starting 10/7 and trending back toward baseline.   Prescribed: Furosemide 40 mg 1 tablet daily.   Labs: 07/08/2019 Creatinine 1.49, BUN 21, Potassium 4.4, Sodium 141, GFR 44-51 05/27/2019 Creatinine 1.36, BUN 18, Potassium 4.5, Sodium 145, GFR 49-57 A complete set of results can be found in Results Review.   Recommendations:  Recommendation to limit salt intake to 2000 mg daily and fluid intake to 64 oz daily.  Encouraged to call if experiencing any fluid symptoms.    Follow-up plan: ICM clinic phone appointment on 01/11/2021 suggesting possible fluid accumulation.  91 day device clinic remote transmission 01/27/2021.       EP/Cardiology Office Visits: 05/18/2021 with Dr. Debara Pickett.   11/11/2021 with Dr [name].     Copy of ICM check sent to Dr. [name].     3 month ICM trend: 01/03/2021.    1 Year ICM trend:       Rosalene Billings, RN 01/03/2021 10:07 AM

## 2021-01-11 ENCOUNTER — Ambulatory Visit (INDEPENDENT_AMBULATORY_CARE_PROVIDER_SITE_OTHER): Payer: Medicare PPO

## 2021-01-11 DIAGNOSIS — Z95810 Presence of automatic (implantable) cardiac defibrillator: Secondary | ICD-10-CM

## 2021-01-11 DIAGNOSIS — I5022 Chronic systolic (congestive) heart failure: Secondary | ICD-10-CM

## 2021-01-13 ENCOUNTER — Other Ambulatory Visit: Payer: Self-pay | Admitting: Internal Medicine

## 2021-01-14 NOTE — Progress Notes (Signed)
EPIC Encounter for ICM Monitoring  Patient Name: Rickey Rice is a 80 y.o. male Date: 01/14/2021 Primary Care Physican: Lucianne Lei, MD Primary Cardiologist: [name] Electrophysiologist: Vergie Living Pacing:  >99%       12/01/2020 Weight: 298 lbs 01/03/2021 Weight:                                          Spoke with patient and heart failure questions reviewed.  Pt asymptomatic for fluid accumulation and feeling well.   Corvue Thoracic impedance suggesting fluid levels returned to normal.    Prescribed: Furosemide 40 mg 1 tablet daily.   Labs: 07/08/2019 Creatinine 1.49, BUN 21, Potassium 4.4, Sodium 141, GFR 44-51 05/27/2019 Creatinine 1.36, BUN 18, Potassium 4.5, Sodium 145, GFR 49-57 A complete set of results can be found in Results Review.   Recommendations:  No changes and encouraged to call if experiencing any fluid symptoms.   Follow-up plan: ICM clinic phone appointment on 02/07/2021.  91 day device clinic remote transmission 01/27/2021.       EP/Cardiology Office Visits: 05/18/2021 with Dr. Debara Pickett.   11/11/2021 with Dr [name].     Copy of ICM check sent to Dr. [name].      3 month ICM trend: 01/12/2021.    1 Year ICM trend:       Rosalene Billings, RN 01/14/2021 2:23 PM

## 2021-01-27 ENCOUNTER — Ambulatory Visit (INDEPENDENT_AMBULATORY_CARE_PROVIDER_SITE_OTHER): Payer: Medicare PPO

## 2021-01-27 DIAGNOSIS — I428 Other cardiomyopathies: Secondary | ICD-10-CM

## 2021-01-27 LAB — CUP PACEART REMOTE DEVICE CHECK
Battery Remaining Longevity: 21 mo
Battery Remaining Percentage: 26 %
Battery Voltage: 2.89 V
Brady Statistic AP VP Percent: 1 %
Brady Statistic AP VS Percent: 1 %
Brady Statistic AS VP Percent: 99 %
Brady Statistic AS VS Percent: 1 %
Brady Statistic RA Percent Paced: 1 %
Date Time Interrogation Session: 20221103020017
HighPow Impedance: 66 Ohm
HighPow Impedance: 66 Ohm
Implantable Lead Implant Date: 20160324
Implantable Lead Implant Date: 20160324
Implantable Lead Implant Date: 20160324
Implantable Lead Location: 753858
Implantable Lead Location: 753859
Implantable Lead Location: 753860
Implantable Lead Model: 7122
Implantable Pulse Generator Implant Date: 20160324
Lead Channel Impedance Value: 380 Ohm
Lead Channel Impedance Value: 460 Ohm
Lead Channel Impedance Value: 530 Ohm
Lead Channel Pacing Threshold Amplitude: 1 V
Lead Channel Pacing Threshold Amplitude: 1 V
Lead Channel Pacing Threshold Amplitude: 1.625 V
Lead Channel Pacing Threshold Pulse Width: 0.5 ms
Lead Channel Pacing Threshold Pulse Width: 0.5 ms
Lead Channel Pacing Threshold Pulse Width: 0.5 ms
Lead Channel Sensing Intrinsic Amplitude: 12 mV
Lead Channel Sensing Intrinsic Amplitude: 2.2 mV
Lead Channel Setting Pacing Amplitude: 2 V
Lead Channel Setting Pacing Amplitude: 2.5 V
Lead Channel Setting Pacing Amplitude: 2.625
Lead Channel Setting Pacing Pulse Width: 0.5 ms
Lead Channel Setting Pacing Pulse Width: 0.5 ms
Lead Channel Setting Sensing Sensitivity: 0.5 mV
Pulse Gen Serial Number: 7240350

## 2021-02-02 NOTE — Progress Notes (Signed)
Remote ICD transmission.   

## 2021-02-07 ENCOUNTER — Ambulatory Visit (INDEPENDENT_AMBULATORY_CARE_PROVIDER_SITE_OTHER): Payer: Medicare PPO

## 2021-02-07 DIAGNOSIS — I5022 Chronic systolic (congestive) heart failure: Secondary | ICD-10-CM | POA: Diagnosis not present

## 2021-02-07 DIAGNOSIS — Z95810 Presence of automatic (implantable) cardiac defibrillator: Secondary | ICD-10-CM | POA: Diagnosis not present

## 2021-02-08 NOTE — Progress Notes (Signed)
EPIC Encounter for ICM Monitoring  Patient Name: MASSIMILIANO ROHLEDER is a 80 y.o. male Date: 02/08/2021 Primary Care Physican: Lucianne Lei, MD Primary Cardiologist: [name] Electrophysiologist: Vergie Living Pacing:  >99%       12/01/2020 Weight: 298 lbs                                          Spoke with patient and heart failure questions reviewed.  Pt asymptomatic for fluid accumulation and feeling well.   Corvue Thoracic impedance suggesting normal fluid levels.    Prescribed: Furosemide 40 mg 1 tablet daily.   Labs: 07/08/2019 Creatinine 1.49, BUN 21, Potassium 4.4, Sodium 141, GFR 44-51 05/27/2019 Creatinine 1.36, BUN 18, Potassium 4.5, Sodium 145, GFR 49-57 A complete set of results can be found in Results Review.   Recommendations:  No changes and encouraged to call if experiencing any fluid symptoms.   Follow-up plan: ICM clinic phone appointment on 03/14/2021.  91 day device clinic remote transmission 04/28/2021.       EP/Cardiology Office Visits: 05/18/2021 with Dr. Debara Pickett.   11/11/2021 with Dr [name].     Copy of ICM check sent to Dr. [name].      3 month ICM trend: 02/07/2021.    12-14 Month ICM trend:       Rosalene Billings, RN 02/08/2021 3:28 PM

## 2021-02-10 DIAGNOSIS — Z6841 Body Mass Index (BMI) 40.0 and over, adult: Secondary | ICD-10-CM | POA: Diagnosis not present

## 2021-02-10 DIAGNOSIS — I429 Cardiomyopathy, unspecified: Secondary | ICD-10-CM | POA: Diagnosis not present

## 2021-02-10 DIAGNOSIS — E6609 Other obesity due to excess calories: Secondary | ICD-10-CM | POA: Diagnosis not present

## 2021-02-10 DIAGNOSIS — E1169 Type 2 diabetes mellitus with other specified complication: Secondary | ICD-10-CM | POA: Diagnosis not present

## 2021-02-10 DIAGNOSIS — I10 Essential (primary) hypertension: Secondary | ICD-10-CM | POA: Diagnosis not present

## 2021-02-10 DIAGNOSIS — W57XXXD Bitten or stung by nonvenomous insect and other nonvenomous arthropods, subsequent encounter: Secondary | ICD-10-CM | POA: Diagnosis not present

## 2021-02-10 DIAGNOSIS — E11649 Type 2 diabetes mellitus with hypoglycemia without coma: Secondary | ICD-10-CM | POA: Diagnosis not present

## 2021-02-10 DIAGNOSIS — Z95810 Presence of automatic (implantable) cardiac defibrillator: Secondary | ICD-10-CM | POA: Diagnosis not present

## 2021-02-22 ENCOUNTER — Other Ambulatory Visit: Payer: Self-pay

## 2021-02-22 ENCOUNTER — Telehealth: Payer: Self-pay | Admitting: Internal Medicine

## 2021-02-22 ENCOUNTER — Other Ambulatory Visit: Payer: Self-pay | Admitting: Internal Medicine

## 2021-02-22 MED ORDER — FUROSEMIDE 40 MG PO TABS: 40.0000 mg | ORAL_TABLET | Freq: Every day | ORAL | 2 refills | Status: DC

## 2021-02-22 NOTE — Telephone Encounter (Signed)
*  STAT* If patient is at the pharmacy, call can be transferred to refill team.   1. Which medications need to be refilled? (please list name of each medication and dose if known) furosemide (LASIX) 40 MG tablet  2. Which pharmacy/location (including street and city if local pharmacy) is medication to be sent to? [id number] - [place], [address]  3. Do they need a 30 day or 90 day supply? 90 day   Patient has 2 days left.

## 2021-02-22 NOTE — Telephone Encounter (Signed)
This is Dr. [name]'s pt 

## 2021-02-23 NOTE — Telephone Encounter (Signed)
Pt's medication has already been sent to pt's pharmacy as requested. Confirmation received.  

## 2021-03-14 ENCOUNTER — Ambulatory Visit (INDEPENDENT_AMBULATORY_CARE_PROVIDER_SITE_OTHER): Payer: Medicare PPO

## 2021-03-14 DIAGNOSIS — Z95810 Presence of automatic (implantable) cardiac defibrillator: Secondary | ICD-10-CM | POA: Diagnosis not present

## 2021-03-14 DIAGNOSIS — I5022 Chronic systolic (congestive) heart failure: Secondary | ICD-10-CM

## 2021-03-15 NOTE — Progress Notes (Signed)
EPIC Encounter for ICM Monitoring  Patient Name: Rickey Rice is a 79 y.o. male Date: 03/15/2021 Primary Care Physican: Lucianne Lei, MD Primary Cardiologist: [name] Electrophysiologist: Vergie Living Pacing:  >99%       12/01/2020 Weight: 298 lbs                                           Spoke with patient and heart failure questions reviewed.  Pt asymptomatic for fluid accumulation and feeling well.   Corvue Thoracic impedance suggesting normal fluid levels.    Prescribed: Furosemide 40 mg 1 tablet daily.   Labs: 07/08/2019 Creatinine 1.49, BUN 21, Potassium 4.4, Sodium 141, GFR 44-51 05/27/2019 Creatinine 1.36, BUN 18, Potassium 4.5, Sodium 145, GFR 49-57 A complete set of results can be found in Results Review.   Recommendations:  No changes and encouraged to call if experiencing any fluid symptoms.   Follow-up plan: ICM clinic phone appointment on 04/18/2021.  91 day device clinic remote transmission 04/28/2021.       EP/Cardiology Office Visits: 05/18/2021 with Dr. Debara Pickett.   11/11/2021 with Dr [name].     Copy of ICM check sent to Dr. [name].      3 month ICM trend: 03/14/2021.    12-14 Month ICM trend:       Rosalene Billings, RN 03/15/2021 12:41 PM

## 2021-04-05 DIAGNOSIS — E119 Type 2 diabetes mellitus without complications: Secondary | ICD-10-CM | POA: Diagnosis not present

## 2021-04-05 DIAGNOSIS — H52223 Regular astigmatism, bilateral: Secondary | ICD-10-CM | POA: Diagnosis not present

## 2021-04-05 DIAGNOSIS — Z961 Presence of intraocular lens: Secondary | ICD-10-CM | POA: Diagnosis not present

## 2021-04-05 DIAGNOSIS — H0102B Squamous blepharitis left eye, upper and lower eyelids: Secondary | ICD-10-CM | POA: Diagnosis not present

## 2021-04-05 DIAGNOSIS — H401211 Low-tension glaucoma, right eye, mild stage: Secondary | ICD-10-CM | POA: Diagnosis not present

## 2021-04-05 DIAGNOSIS — H04123 Dry eye syndrome of bilateral lacrimal glands: Secondary | ICD-10-CM | POA: Diagnosis not present

## 2021-04-05 DIAGNOSIS — H401222 Low-tension glaucoma, left eye, moderate stage: Secondary | ICD-10-CM | POA: Diagnosis not present

## 2021-04-05 DIAGNOSIS — H524 Presbyopia: Secondary | ICD-10-CM | POA: Diagnosis not present

## 2021-04-05 DIAGNOSIS — H0102A Squamous blepharitis right eye, upper and lower eyelids: Secondary | ICD-10-CM | POA: Diagnosis not present

## 2021-04-18 ENCOUNTER — Ambulatory Visit (INDEPENDENT_AMBULATORY_CARE_PROVIDER_SITE_OTHER): Payer: Medicare PPO

## 2021-04-18 DIAGNOSIS — I5022 Chronic systolic (congestive) heart failure: Secondary | ICD-10-CM

## 2021-04-18 DIAGNOSIS — Z95810 Presence of automatic (implantable) cardiac defibrillator: Secondary | ICD-10-CM

## 2021-04-18 NOTE — Progress Notes (Signed)
EPIC Encounter for ICM Monitoring  Patient Name: Rickey Rice is a 81 y.o. male Date: 04/18/2021 Primary Care Physican: Lucianne Lei, MD Primary Cardiologist: [name] Electrophysiologist: Vergie Living Pacing:  >99%       04/18/2021 Weight: 300 lbs                                           Spoke with patient and heart failure questions reviewed.  Pt asymptomatic for fluid accumulation and feeling well.   Corvue Thoracic impedance suggesting possible dryness starting 04/14/2021.    Prescribed: Furosemide 40 mg 1 tablet daily.   Labs: 07/08/2019 Creatinine 1.49, BUN 21, Potassium 4.4, Sodium 141, GFR 44-51 05/27/2019 Creatinine 1.36, BUN 18, Potassium 4.5, Sodium 145, GFR 49-57 A complete set of results can be found in Results Review.   Recommendations:  No changes and encouraged to call if experiencing any fluid symptoms.   Follow-up plan: ICM clinic phone appointment on 05/23/2021.  91 day device clinic remote transmission 04/28/2021.       EP/Cardiology Office Visits: 05/18/2021 with Dr. Debara Pickett.   11/11/2021 with Dr [name].     Copy of ICM check sent to Dr. [name].      3 month ICM trend: 04/18/2021.    12-14 Month ICM trend:     Rosalene Billings, RN 04/18/2021 4:25 PM

## 2021-04-19 ENCOUNTER — Encounter: Payer: Self-pay | Admitting: Emergency Medicine

## 2021-04-19 ENCOUNTER — Other Ambulatory Visit: Payer: Self-pay

## 2021-04-19 ENCOUNTER — Ambulatory Visit
Admission: EM | Admit: 2021-04-19 | Discharge: 2021-04-19 | Disposition: A | Discharge status: Home / Self Care | Payer: Medicare PPO | Attending: Family Medicine | Admitting: Family Medicine

## 2021-04-19 DIAGNOSIS — J069 Acute upper respiratory infection, unspecified: Secondary | ICD-10-CM

## 2021-04-19 MED ORDER — MOLNUPIRAVIR EUA 200MG CAPSULE: 4.0000 | ORAL_CAPSULE | Freq: Two times a day (BID) | ORAL | 0 refills | Status: AC

## 2021-04-19 NOTE — ED Triage Notes (Addendum)
Requesting test for covid.  Wife was seen at [facility] earlier today.  Patient has a runny nose, patient reports this started 4-5 days ago.  Denies any other syptoms.  Patient attended a funeral over the past few days

## 2021-04-19 NOTE — ED Provider Notes (Signed)
[facility]    CSN: [id number] Arrival date & time: 04/19/21  1117      History   Chief Complaint Chief Complaint  Patient presents with   Nasal Congestion    HPI Rickey Rice is a 81 y.o. male.   Presenting today requesting test for COVID-19.  States that he has had 4 to 5 days of runny nose, mild cough.  Denies fever, chills, body aches, chest pain, shortness of breath, abdominal pain, nausea vomiting or diarrhea.  Has been around multiple sick contacts recently, and wife was tested for COVID this morning and treated with antiviral while awaiting results.  No known chronic pulmonary disease apart from sleep apnea.  Numerous chronic cardiac conditions, diabetes, obesity.   Past Medical History:  Diagnosis Date   AICD (automatic cardioverter/defibrillator) present    Arthritis    oa   Cardiac arrest ([place]) 06/07/2014   Primary VF arrest with successful resuscitation and s/p ICD implant   Diabetes mellitus without complication ([place])    type 2   Dyslipidemia    History of nuclear stress test 04/2012   lexiscan - 2 day protocol; low risk study, evidence of inferrior & apical scar but no ischemia    Hypertension    Hypothyroidism    LBBB (left bundle branch block)    Morbid obesity ([place])    NICM (nonischemic cardiomyopathy) ([place])    Prostate enlargement    pt denies    Patient Active Problem List   Diagnosis Date Noted   Biventricular ICD (implantable cardioverter-defibrillator) in place 09/12/2020   OSA (obstructive sleep apnea) 12/19/2018   Salivary stone 02/01/2018   Impacted cerumen of both ears 05/16/2017   Sensorineural hearing loss (SNHL), bilateral 05/16/2017   Encounter for immunization 02/15/2016   VT (ventricular tachycardia) 13/24/4010   Chronic systolic congestive heart failure ([place]) 09/24/2015   S/P implantation of automatic cardioverter/defibrillator (AICD) 07/26/2014   CKD (chronic kidney disease) stage 2, GFR 60-89 ml/min 06/11/2014    DM type 2 causing renal disease ([place]) 06/11/2014   BPH (benign prostatic hypertrophy) 06/11/2014   Obesity, morbid ([place]) 06/11/2014   Essential hypertension    NICM (nonischemic cardiomyopathy) ([place]) 06/08/2014   History of cardiac arrest    PEA (Pulseless electrical activity) ([place]) 06/07/2014   Degenerative arthritis of left knee 01/19/2014   Arthritis of left knee 01/19/2014   Unspecified constipation 03/31/2013   Difficulty in walking(719.7) 03/12/2013   Stiffness of right knee 03/12/2013   Right leg weakness 03/12/2013   Osteoarthritis of right knee 02/06/2013   LBBB (left bundle branch block) 01/07/2013   DM2 (diabetes mellitus, type 2) ([place]) 01/07/2013   Hypothyroidism 01/07/2013   Morbid obesity ([place]) 01/07/2013   Dyslipidemia 01/07/2013    Past Surgical History:  Procedure Laterality Date   BI-VENTRICULAR IMPLANTABLE CARDIOVERTER DEFIBRILLATOR N/A 06/18/2014   STJ CRTD implanted by Dr [name]   COLONOSCOPY W/ BIOPSIES     COLONOSCOPY WITH PROPOFOL N/A 04/05/2017   Procedure: COLONOSCOPY WITH PROPOFOL;  Surgeon: Juanita Craver, MD;  Location: [facility];  Service: Endoscopy;  Laterality: N/A;   FINGER SURGERY  1954   1 st joint  right hand amputated   KNEE ARTHROSCOPY Left    LEFT HEART CATHETERIZATION WITH CORONARY ANGIOGRAM N/A 06/12/2014   Procedure: LEFT HEART CATHETERIZATION WITH CORONARY ANGIOGRAM;  Surgeon: Leonie Man, MD;  Location[facility] CATH LAB;  Service: Cardiovascular;  Laterality: N/A;   [place] ARTHROPLASTY Right 02/05/2013  Procedure: TOTAL KNEE ARTHROPLASTY;  Surgeon: Kerin Salen, MD;  Location: [place];  Service: Orthopedics;  Laterality: Right;   TOTAL KNEE ARTHROPLASTY Left 01/19/2014   Procedure: LEFT TOTAL KNEE ARTHROPLASTY;  Surgeon: Kerin Salen, MD;  Location: [place];  Service: Orthopedics;  Laterality: Left;       Home Medications    Prior to Admission medications   Medication Sig Start Date End Date Taking?  Authorizing Provider  molnupiravir EUA (LAGEVRIO) 200 mg CAPS capsule Take 4 capsules (800 mg total) by mouth 2 (two) times daily for 5 days. 04/19/21 04/24/21 Yes Volney American, PA-C  acetaminophen (TYLENOL) 500 MG tablet Take 500 mg by mouth daily as needed for moderate pain or headache.    [provider]  aspirin EC 81 MG tablet Take 81 mg by mouth daily.    [provider]  brimonidine (ALPHAGAN) 0.2 % ophthalmic solution Place 1 drop into the left eye 3 (three) times daily.    [provider]  carboxymethylcellulose (REFRESH PLUS) 0.5 % SOLN Place 1 drop into both eyes daily.    [provider]  carvedilol (COREG) 12.5 MG tablet TAKE 1 TABLET BY MOUTH TWICE DAILY WITH A MEAL 06/29/20   [name], Nadean Corwin, MD  dapagliflozin propanediol (FARXIGA) 10 MG TABS tablet Take 10 mg by mouth daily.    [provider]  ENTRESTO 97-103 MG TAKE 1 TABLET BY MOUTH TWICE DAILY 10/13/20   [name], Nadean Corwin, MD  furosemide (LASIX) 40 MG tablet Take 1 tablet (40 mg total) by mouth daily. 02/22/21   [name], Nadean Corwin, MD  glucose blood test strip USE AS DIRECTED TO CHECK BLOOD SUGAR BID 01/21/18   [provider]  levothyroxine (SYNTHROID, LEVOTHROID) 75 MCG tablet Take 75 mcg by mouth every evening. Monday through Saturday    [provider]  Multiple Vitamins-Minerals (ICAPS AREDS 2) CAPS Take 1 capsule by mouth 2 (two) times daily.    [provider]  Omega-3 Fatty Acids (OMEGA 3 PO) Take 1,600 mg by mouth in the morning and at bedtime.    [provider]  rosuvastatin (CRESTOR) 10 MG tablet Take 1 tablet (10 mg total) by mouth daily at 6 PM. 06/19/14   Mikhail, Velta Addison, DO  saxagliptin HCl (ONGLYZA) 5 MG TABS tablet Take 5 mg by mouth daily.    [provider]  spironolactone (ALDACTONE) 25 MG tablet TAKE 1/2 TABLET(12.5 MG) BY MOUTH DAILY 01/13/21   [name], Nadean Corwin, MD  TURMERIC CURCUMIN PO Take 1 tablet by mouth daily.     [provider]    Family History Family History  Problem Relation Age of Onset   Cancer Mother    Kidney disease Brother    Hypertension Sister     Social History Social History   Tobacco Use   Smoking status: Never   Smokeless tobacco: Never  Vaping Use   Vaping Use: Never used  Substance Use Topics   Alcohol use: Yes    Comment: rare wine use   Drug use: No     Allergies   Bee venom, Demerol [meperidine], Pioglitazone, Shrimp [shellfish allergy], and Sulfa antibiotics   Review of Systems Review of Systems Per HPI  Physical Exam Triage Vital Signs ED Triage Vitals  Enc Vitals Group     BP 04/19/21 1220 100/65     Pulse Rate 04/19/21 1220 66     Resp 04/19/21 1220 (!) 22     Temp 04/19/21 1220  98.3 F (36.8 C)     Temp Source 04/19/21 1220 Oral     SpO2 04/19/21 1220 96 %     Weight --      Height --      Head Circumference --      Peak Flow --      Pain Score 04/19/21 1218 0     Pain Loc --      Pain Edu? --      Excl. in [place]? --    No data found.  Updated Vital Signs BP 100/65 (BP Location: Right Arm) Comment (BP Location): large cuff   Pulse 66    Temp 98.3 F (36.8 C) (Oral)    Resp (!) 22    SpO2 96%   Visual Acuity Right Eye Distance:   Left Eye Distance:   Bilateral Distance:    Right Eye Near:   Left Eye Near:    Bilateral Near:     Physical Exam Vitals and nursing note reviewed.  Constitutional:      Appearance: He is well-developed.  HENT:     Head: Atraumatic.     Right Ear: External ear normal.     Left Ear: External ear normal.     Nose: Rhinorrhea present.     Mouth/Throat:     Pharynx: Posterior oropharyngeal erythema present. No oropharyngeal exudate.  Eyes:     Conjunctiva/sclera: Conjunctivae normal.     Pupils: Pupils are equal, round, and reactive to light.  Cardiovascular:     Rate and Rhythm: Normal rate and regular rhythm.  Pulmonary:     Effort: Pulmonary effort is normal. No respiratory  distress.     Breath sounds: No wheezing or rales.  Abdominal:     General: Bowel sounds are normal. There is no distension.     Palpations: Abdomen is soft.     Tenderness: There is no abdominal tenderness.  Musculoskeletal:        General: Normal range of motion.     Cervical back: Normal range of motion and neck supple.  Lymphadenopathy:     Cervical: No cervical adenopathy.  Skin:    General: Skin is warm and dry.  Neurological:     Mental Status: He is alert and oriented to person, place, and time.  Psychiatric:        Behavior: Behavior normal.     UC Treatments / Results  Labs (all labs ordered are listed, but only abnormal results are displayed) Labs Reviewed  COVID-19, FLU A+B NAA    EKG   Radiology No results found.  Procedures Procedures (including critical care time)  Medications Ordered in UC Medications - No data to display  Initial Impression / Assessment and Plan / UC Course  I have reviewed the triage vital signs and the nursing notes.  Pertinent labs & imaging results that were available during my care of the patient were reviewed by me and considered in my medical decision making (see chart for details).     Vital signs benign and reassuring, COVID and flu test pending.  We will start antiviral for COVID while awaiting these test results given exposures and symptoms.  Discussed supportive over-the-counter medications and home care additionally.  Return for acutely worsening symptoms  Final Clinical Impressions(s) / UC Diagnoses   Final diagnoses:  Viral URI with cough   Discharge Instructions   None    ED Prescriptions     Medication Sig Dispense Auth. Provider   molnupiravir EUA (  LAGEVRIO) 200 mg CAPS capsule Take 4 capsules (800 mg total) by mouth 2 (two) times daily for 5 days. 40 capsule Volney American, [place]      PDMP not reviewed this encounter.   Volney American, [place] 04/19/21 1254

## 2021-04-20 LAB — COVID-19, FLU A+B NAA
Influenza A, NAA: NOT DETECTED
Influenza B, NAA: NOT DETECTED
SARS-CoV-2, NAA: NOT DETECTED

## 2021-04-28 ENCOUNTER — Telehealth: Payer: Self-pay

## 2021-04-28 ENCOUNTER — Ambulatory Visit (INDEPENDENT_AMBULATORY_CARE_PROVIDER_SITE_OTHER): Payer: Medicare PPO

## 2021-04-28 DIAGNOSIS — I428 Other cardiomyopathies: Secondary | ICD-10-CM | POA: Diagnosis not present

## 2021-04-28 LAB — CUP PACEART REMOTE DEVICE CHECK
Battery Remaining Longevity: 22 mo
Battery Remaining Percentage: 27 %
Battery Voltage: 2.87 V
Brady Statistic AP VP Percent: 1 %
Brady Statistic AP VS Percent: 1 %
Brady Statistic AS VP Percent: 99 %
Brady Statistic AS VS Percent: 1 %
Brady Statistic RA Percent Paced: 1 %
Date Time Interrogation Session: 20230202020015
HighPow Impedance: 64 Ohm
HighPow Impedance: 64 Ohm
Implantable Lead Implant Date: 20160324
Implantable Lead Implant Date: 20160324
Implantable Lead Implant Date: 20160324
Implantable Lead Location: 753858
Implantable Lead Location: 753859
Implantable Lead Location: 753860
Implantable Lead Model: 7122
Implantable Pulse Generator Implant Date: 20160324
Lead Channel Impedance Value: 380 Ohm
Lead Channel Impedance Value: 460 Ohm
Lead Channel Impedance Value: 550 Ohm
Lead Channel Pacing Threshold Amplitude: 1 V
Lead Channel Pacing Threshold Amplitude: 1 V
Lead Channel Pacing Threshold Amplitude: 1.75 V
Lead Channel Pacing Threshold Pulse Width: 0.5 ms
Lead Channel Pacing Threshold Pulse Width: 0.5 ms
Lead Channel Pacing Threshold Pulse Width: 0.5 ms
Lead Channel Sensing Intrinsic Amplitude: 12 mV
Lead Channel Sensing Intrinsic Amplitude: 2.7 mV
Lead Channel Setting Pacing Amplitude: 2 V
Lead Channel Setting Pacing Amplitude: 2.5 V
Lead Channel Setting Pacing Amplitude: 2.75 V
Lead Channel Setting Pacing Pulse Width: 0.5 ms
Lead Channel Setting Pacing Pulse Width: 0.5 ms
Lead Channel Setting Sensing Sensitivity: 0.5 mV
Pulse Gen Serial Number: 7240350

## 2021-04-28 NOTE — Telephone Encounter (Signed)
Scheduled remote reviewed. Normal device function.   1 AMS showing AF 1/29, duration 4hrs 61min, controlled ventricular rates No hx of AF noted, ASA, Coreg Next remote 91 days. Route to triage  Successful telephone encounter to patient to assess for s/s of AF as indicated above. Patient unaware of episode. Confirmed new onset by reviewed post remotes and EMR. Of note patient presented to ED with viral URI 04/19/21 (wife + covid that morning). Tested negative however was prescribed molnupiravir. Will route for review and advisement.

## 2021-04-29 ENCOUNTER — Ambulatory Visit: Payer: Medicare PPO | Admitting: Cardiology

## 2021-05-02 NOTE — Telephone Encounter (Signed)
[facility]  will follow

## 2021-05-04 NOTE — Progress Notes (Signed)
Remote ICD transmission.   

## 2021-05-13 DIAGNOSIS — E1169 Type 2 diabetes mellitus with other specified complication: Secondary | ICD-10-CM | POA: Diagnosis not present

## 2021-05-13 DIAGNOSIS — E669 Obesity, unspecified: Secondary | ICD-10-CM | POA: Diagnosis not present

## 2021-05-13 DIAGNOSIS — I10 Essential (primary) hypertension: Secondary | ICD-10-CM | POA: Diagnosis not present

## 2021-05-18 ENCOUNTER — Other Ambulatory Visit: Payer: Self-pay

## 2021-05-18 ENCOUNTER — Ambulatory Visit: Payer: Medicare PPO | Admitting: Internal Medicine

## 2021-05-18 ENCOUNTER — Encounter: Payer: Self-pay | Admitting: Internal Medicine

## 2021-05-18 VITALS — BP 92/58 | HR 66 | Ht 71.05 in | Wt 308.6 lb

## 2021-05-18 DIAGNOSIS — Z9989 Dependence on other enabling machines and devices: Secondary | ICD-10-CM

## 2021-05-18 DIAGNOSIS — I10 Essential (primary) hypertension: Secondary | ICD-10-CM

## 2021-05-18 DIAGNOSIS — I5022 Chronic systolic (congestive) heart failure: Secondary | ICD-10-CM | POA: Diagnosis not present

## 2021-05-18 DIAGNOSIS — Z95810 Presence of automatic (implantable) cardiac defibrillator: Secondary | ICD-10-CM

## 2021-05-18 DIAGNOSIS — G4733 Obstructive sleep apnea (adult) (pediatric): Secondary | ICD-10-CM

## 2021-05-18 NOTE — Progress Notes (Signed)
OFFICE NOTE  Chief Complaint:  Follow-up heart failure  Primary Care Physician: Lucianne Lei, MD  HPI:  Rickey Rice  is a 81 year old gentleman with a history of diabetes, hypertension, hypothyroidism, and morbid obesity. In 2010 he had complaints of chest pain and underwent stress testing with [facility] which was a 2-day nuclear stress test and was apparently negative. Recently he underwent a colonoscopy and a preoperative EKG was abnormal. I unfortunately do not have that EKG to review, but I did review the EKG from your office on April 09, 2012 which showed a borderline intraventricular conduction delay, a sinus rhythm at 66, and poor R-wave progression. Today in the office he has an abnormal EKG demonstrating a left bundle branch block with a QRS duration of 168 msec, heart rate of 78 in sinus. His only symptoms are shortness of breath with exertion. He underwent evaluation of his left bundle branch block in February 2014. This is a 2 day nuclear stress test which was negative for ischemia. There was a small inferior and apical defect which could represent scar versus artifact. EF was mildly reduced at 49%.    Rickey Rice returns today for followup. He underwent knee replacement surgery as we had authorized him to do. He is tolerated surgery without any complications. He reports that he is recovering fairly well and is more mobile than he had been in the past. He started to do some exercises at the [facility] and also this will translate into weight loss. Blood pressure has been stable he denies any chest pain.  He had blood work in January from his primary care provider which showed a total cholesterol of 128, HDL 51, triglycerides 53 and LDL 65. His hemoglobin A1c was 6.2.  Rickey Rice is now contemplating lethargy. Unfortunately has not been able to walk enough to lose weight in fact has gained some weight. 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 He feels like it's time to retire work on his generalized health. I would tend to agree with this.  I saw Rickey Rice back in the office today. He recently followed up from the hospital after having a sudden cardiac arrest. Unfortunately he was revived and is now status post AICD. He was to have CRT-D therapy, however there was difficulty with the LV lead, therefore he only has a single ventricle pacing with defibrillator functions. He is tolerating this well and had a small episode with fever postoperatively, but this was thought to be due to possibly a UTI. There is no evidence of pacemaker site fluctuance or swelling. Overall he feels he is doing well. He started exercising is managed to lose some weight. He is less short of breath and is more active.  Rickey Rice returns today for follow-up. Overall he is doing exceedingly well. He's managed to lose about 10 pounds. He is now below 300 pounds. His shortness of breath is improved some and he is ambulating better on his knees. He's had no device problems including no firings. He denies any chest pain or worsening shortness of breath and has not had any presyncopal or syncopal episodes.  Rickey Rice returns today for follow-up. Unfortunately he's gained some of his weight back. He denies a chest pain or worsening shortness of breath. He does not have any swelling. His last EF was as mentioned 40% in March 2016. EKG shows left bundle branch block. He scheduled to see Dr. Caryl Rice back for device  interrogation next month.  11/12/2015  Rickey Rice was seen today in follow-up. Unfortunately he was recently seen for some weight gain and fatigue by Rosaria Ferries, PA-C. She felt that he might be getting some extra fluid and recommended a short trial of increased Lasix. Weight is actually been fairly stable now at 300-301. Around that time he was found to have a UTI was treated for that and his symptoms improved. He was also noted to have  problems with infection and underwent multiple rounds of antibiotics. This led to problems with diarrhea or digestive problems and ultimately he went on probiotics. Recently, though he was talking on the phone and had an episode of transient global amnesia shortly after his wife left the house. He called her back and she came home and noted that he may have had an episode with his defibrillator. This was subsequently confirmed to be VT and he underwent a shock for that. He is seen Dr. Caryl Rice who increased his carvedilol to 12-1/2 mg twice a day. Blood pressure is actually a little low today but he reports being asymptomatic with that.  02/15/2016  Rickey Rice returns today for follow-up. He reports feeling well and denies worsening shortness of breath or chest pain. HE has not had any presyncopal spells. He just saw EP who noted his defibrillator was working properly. We again discussed strategies for heart failure management, including possibly switching lisinopril over to [facility]. He did not seem interested in this. It is noted his bp is higher today and that may at least allow uptitration of his lisinopril.  05/29/2016  Rickey Rice was seen today in follow-up. Weight is down slightly although has not made a significant improvement. He needs to start to do more exercise but he has blamed the weather and his arthritis. Blood pressure appears to be well-controlled and he brought readings in the office today. Although I do feel that there still potentially some room to consider adding Entresto. We discussed the medication today and it would effectively replace his lisinopril. He understands he would need to wash off the lisinopril for 36-48 hours before starting Entresto. We can provide him with samples and help him secure preauthorization.  12/28/2017  Rickey Rice is seen today in follow-up heart failure.  He reports NYHA class II symptoms, noting some shortness of breath with moderate exertion.  He has  recently had some weight loss.  He seems to be tolerating Entresto 24/26.  According to Dr. Olin Rice notes his LV lead is now activated.  He does appear to be biventricular pacing today.  Labs from July showed total cholesterol 143, HDL 52, LDL 79 triglycerides 42, hemoglobin A1c 5.8.  BNP in November was 155.  Recent remote check showed no change in thoracic impedance suggesting that he is compensated with his heart failure.  We previously discussed increasing the dose of Entresto as blood pressure would tolerate.  He is interested in getting a flu shot today.  11/13/2018  Rickey Rice is seen today back for follow-up of heart failure.  He continues to have NYHA class II symptoms.  He is struggling with weight.  Weight recently has gone up about 6 pounds.  He has been on the moderate dose of Entresto.  Unfortunately repeat echo earlier this year showed no significant improvement in LV function with his EF being 20 to 25%.  This is despite activation of his LV lead and biventricular pacing.  Remote check show stable thoracic impedance and no evidence of volume  overload.  Finally, he has been diagnosed with obstructive sleep apnea and will begin treatment shortly.  Hopefully this will be additionally helpful.  02/13/2019  Rickey Rice returns today for follow-up of his heart failure.  Overall he continues to have NYHA class II symptoms.  He is been able to do most activities without incident.  LVEF remains depressed.  There is been no evidence of volume overload recently on his biventricular pacemaker.  Remote checks are undergoing.  Weight is down a few pounds.  He is try to make some dietary changes.  I added Aldactone and he is now on highest dose Entresto.  Plan is to repeat an echo likely in a few months to see if there is been any interval improvement in LV function.  Also, he was diagnosed with obstructive sleep apnea but is not yet been fitted with a mask.  He said when he was treated with that during  the titration study, the next day he felt the best he is ever felt was able to do many activities and generally had a good energy level.  Hopefully based on his insurance plan he will be able to get equipment beginning in January.  01/07/2020  Rickey Rice seen today in follow-up of his heart failure.  He underwent an echo in February 2021 which does show a small improvement in LVEF up to 35 to 40%.  He continues to have remote defibrillator checks which have been fairly normal.  He denies any shocks.  He reports fairly stable fluid status although weight has gone up to about 312 pounds today from around 300 pounds in the past.  He reports less physical activity and needs to make more appropriate dietary changes.  Despite this blood pressure is well controlled today 116/70.  EKG shows a biventricular paced rhythm.  Lipid profile shows total cholesterol 124, HDL 46, LDL 65 and triglycerides of 47.  Hemoglobin A1c of 5.6.  05/18/2021  Rickey Rice returns today for follow-up.  In January he told me that he had a call from Dr. Caryl Rice that his device going off.  I do not see any evidence of this although he was noted to have mode switching and was noted to be in A-fib.  Dr. Caryl Rice noted that he had SCAF (subclinical device detected afib) -I assured him that if he had had a shock that he would have been aware of it.  He is overdue for an echocardiogram last was in February 2021 which showed EF up to 35 to 40%.  He has had weight gain but no worsening edema.  His wife had COVID-19 a few weeks ago but was treated for that.  He tested negative.  Recent labs 5 days ago showed total cholesterol 142, HDL 52, LDL 77 and triglycerides 57, A1c 6.4% creatinine 1.59.  PMHx:  Past Medical History:  Diagnosis Date   AICD (automatic cardioverter/defibrillator) present    Arthritis    oa   Cardiac arrest ([place]) 06/07/2014   Primary VF arrest with successful resuscitation and s/p ICD implant   Diabetes mellitus without  complication ([place])    type 2   Dyslipidemia    History of nuclear stress test 04/2012   lexiscan - 2 day protocol; low risk study, evidence of inferrior & apical scar but no ischemia    Hypertension    Hypothyroidism    LBBB (left bundle branch block)    Morbid obesity ([place])    NICM (nonischemic cardiomyopathy) ([place])  Prostate enlargement    pt denies    Past Surgical History:  Procedure Laterality Date   BI-VENTRICULAR IMPLANTABLE CARDIOVERTER DEFIBRILLATOR N/A 06/18/2014   STJ CRTD implanted by Dr Caryl Rice   COLONOSCOPY W/ BIOPSIES     COLONOSCOPY WITH PROPOFOL N/A 04/05/2017   Procedure: COLONOSCOPY WITH PROPOFOL;  Surgeon: Juanita Craver, MD;  Location: [facility];  Service: Endoscopy;  Laterality: N/A;   FINGER SURGERY  1954   1 st joint  right hand amputated   KNEE ARTHROSCOPY Left    LEFT HEART CATHETERIZATION WITH CORONARY ANGIOGRAM N/A 06/12/2014   Procedure: LEFT HEART CATHETERIZATION WITH CORONARY ANGIOGRAM;  Surgeon: Leonie Man, MD;  Location: [facility] CATH LAB;  Service: Cardiovascular;  Laterality: N/A;   [place]   TOTAL KNEE ARTHROPLASTY Right 02/05/2013   Procedure: TOTAL KNEE ARTHROPLASTY;  Surgeon: Kerin Salen, MD;  Location: [place];  Service: Orthopedics;  Laterality: Right;   TOTAL KNEE ARTHROPLASTY Left 01/19/2014   Procedure: LEFT TOTAL KNEE ARTHROPLASTY;  Surgeon: Kerin Salen, MD;  Location: [place];  Service: Orthopedics;  Laterality: Left;    FAMHx:  Family History  Problem Relation Age of Onset   Cancer Mother    Kidney disease Brother    Hypertension Sister     SOCHx:   reports that he has never smoked. He has never used smokeless tobacco. He reports current alcohol use. He reports that he does not use drugs.  ALLERGIES:  Allergies  Allergen Reactions   Bee Venom Swelling    Swelling on lips and tongue   Demerol [Meperidine]     Not sure   Pioglitazone     Lips swelling= actos   Shellfish Allergy     rash Other reaction(s):  Unknown   Sulfa Antibiotics     Not sure    ROS: Pertinent items noted in HPI and remainder of comprehensive ROS otherwise negative.  HOME MEDS: Current Outpatient Medications  Medication Sig Dispense Refill   aspirin EC 81 MG tablet Take 81 mg by mouth daily.     brimonidine (ALPHAGAN) 0.2 % ophthalmic solution Place 1 drop into the left eye 3 (three) times daily.     carboxymethylcellulose (REFRESH PLUS) 0.5 % SOLN Place 1 drop into both eyes daily.     carvedilol (COREG) 12.5 MG tablet TAKE 1 TABLET BY MOUTH TWICE DAILY WITH A MEAL 180 tablet 3   dapagliflozin propanediol (FARXIGA) 10 MG TABS tablet Take 10 mg by mouth daily.     ENTRESTO 97-103 MG TAKE 1 TABLET BY MOUTH TWICE DAILY 60 tablet 9   furosemide (LASIX) 40 MG tablet Take 1 tablet (40 mg total) by mouth daily. 90 tablet 2   glucose blood test strip USE AS DIRECTED TO CHECK BLOOD SUGAR BID     levothyroxine (SYNTHROID, LEVOTHROID) 75 MCG tablet Take 75 mcg by mouth every evening. Monday through Saturday     Multiple Vitamins-Minerals (ICAPS AREDS 2) CAPS Take 1 capsule by mouth 2 (two) times daily.     Omega-3 Fatty Acids (OMEGA 3 PO) Take 1,600 mg by mouth in the morning and at bedtime.     rosuvastatin (CRESTOR) 10 MG tablet Take 1 tablet (10 mg total) by mouth daily at 6 PM. 30 tablet 0   saxagliptin HCl (ONGLYZA) 5 MG TABS tablet Take 5 mg by mouth daily.     spironolactone (ALDACTONE) 25 MG tablet TAKE 1/2 TABLET(12.5 MG) BY MOUTH DAILY 45 tablet 3   No current  facility-administered medications for this visit.    LABS/IMAGING: No results found for this or any previous visit (from the past 48 hour(s)). No results found.  VITALS: BP (!) 92/58    Pulse 66    Ht 5' 11.05" (1.805 m)    Wt (!) 308 lb 9.6 oz (140 kg)    SpO2 98%    BMI 42.98 kg/m   EXAM: General appearance: alert, no distress and morbidly obese Neck: no carotid bruit, no JVD and thyroid not enlarged, symmetric, no tenderness/mass/nodules Lungs:  clear to auscultation bilaterally and AICD in left upper chest Heart: regular rate and rhythm, S1, S2 normal, no murmur, click, rub or gallop Abdomen: soft, non-tender; bowel sounds normal; no masses,  no organomegaly and Morbidly obese Extremities: extremities normal, atraumatic, no cyanosis or edema Pulses: 2+ and symmetric Skin: Skin color, texture, turgor normal. No rashes or lesions  EKG: Ventricular pacing at 66-personally reviewed  ASSESSMENT: Status post aborted sudden cardiac death-status post placement of a [employer] Bi-V AICD (LV lead is disabled due to dysfunction) Recent VT/VF with appropriate AICD shock Negative nuclear stress test for ischemia, fixed inferoapical defect suggesting possible scar versus artifact, EF 49% Diabetes type 2 Hypertension-well controlled Dyslipidemia on statin - well controlled Non-ischemic cardiomyopathy-EF 20-25%, improved to 35 to 40% (04/2019) 8.   OSA - not currently on therapy  PLAN: 1.   Rickey Rice had his last echo in 2021 which showed a mild increase in LVEF after placement of a biventricular AICD however the LV lead was disabled due to dysfunction.  He denies any new heart failure symptoms but there has been some weight gain over the holidays.  He struggled with some depression along with many deaths in the family.  I encouraged him to restart his exercise program and work on continued weight loss.  We will repeat an echocardiogram to update his LVEF.  Plan follow-up with me in 6 months or sooner as necessary  Pixie Casino, MD, [facility], [place] Director of the Advanced Lipid Disorders &  Cardiovascular Risk Reduction Clinic Diplomate of the American Board of Clinical Lipidology Attending Cardiologist  Direct Dial: [phone]   Fax: [phone]  Website:  [email or web address] 05/18/2021, 12:30 PM

## 2021-05-18 NOTE — Patient Instructions (Signed)
Medication Instructions:  Your physician recommends that you continue on your current medications as directed. Please refer to the Current Medication list given to you today.  *If you need a refill on your cardiac medications before your next appointment, please call your pharmacy*  Testing/Procedures: Your physician has requested that you have an echocardiogram. Echocardiography is a painless test that uses sound waves to create images of your heart. It provides your doctor with information about the size and shape of your heart and how well your hearts chambers and valves are working. This procedure takes approximately one hour. There are no restrictions for this procedure.    Follow-Up: At [facility], you and your health needs are our priority.  As part of our continuing mission to provide you with exceptional heart care, we have created designated Provider Care Teams.  These Care Teams include your primary Cardiologist (physician) and Advanced Practice Providers (APPs -  Physician Assistants and Nurse Practitioners) who all work together to provide you with the care you need, when you need it.  We recommend signing up for the patient portal called "MyChart".  Sign up information is provided on this After Visit Summary.  MyChart is used to connect with patients for Virtual Visits (Telemedicine).  Patients are able to view lab/test results, encounter notes, upcoming appointments, etc.  Non-urgent messages can be sent to your provider as well.   To learn more about what you can do with MyChart, go to [email or web address].    Your next appointment:    6 months with Dr. Debara Pickett

## 2021-05-19 ENCOUNTER — Telehealth: Payer: Self-pay | Admitting: Internal Medicine

## 2021-05-19 NOTE — Telephone Encounter (Signed)
Called patient. Notified him MD is aware of lasix dose

## 2021-05-19 NOTE — Telephone Encounter (Signed)
Will note that he is only taking 20 mg lasix instead of 40 mg.  Dr Lemmie Evens

## 2021-05-19 NOTE — Telephone Encounter (Signed)
Pt c/o medication issue:  1. Name of Medication: furosemide (LASIX) 40 MG tablet  2. How are you currently taking this medication (dosage and times per day)? States he is supposed to take 20 mg not 40 mg  3. Are you having a reaction (difficulty breathing--STAT)? no  4. What is your medication issue? Patient states the furosemide listed in his chart is wrong. He says he is supposed to take 20 mg a day not 40 mg.

## 2021-05-19 NOTE — Telephone Encounter (Signed)
Spoke to patient. Patient states he taking 20 mg  lasix daily . Patient states the  mix up occurred Dr [name] had directed him to take 40 mg for a few days sometime last year but to return to 20 mg daily.  Patient states the information he had on his phone was incorrect,he went home after the visit and read the medication bottles --the bottle states 20 mg  daily . Patient again states "20 mg furosemide one tablet daily " that this is correct.  RN asked patient to read who prescribed medication 20 mg Furosemide. Prescription bottle prescribed by  Dr Criss Rosales 03/04/21 quantity of 270 tablets -- per patient   Patient again states he has been taking 20 mg daily.   RN informed patient in the future please bring the actual medication bottles to every appointment .  Patient request Dr Debara Pickett call him to discuss.  RN placed reported dose of patient medication list .   The 40 mg dose has not been removed until confirmation with provider.

## 2021-05-23 ENCOUNTER — Ambulatory Visit (INDEPENDENT_AMBULATORY_CARE_PROVIDER_SITE_OTHER): Payer: Medicare PPO

## 2021-05-23 DIAGNOSIS — I5022 Chronic systolic (congestive) heart failure: Secondary | ICD-10-CM

## 2021-05-23 DIAGNOSIS — Z95810 Presence of automatic (implantable) cardiac defibrillator: Secondary | ICD-10-CM

## 2021-05-27 NOTE — Progress Notes (Signed)
EPIC Encounter for ICM Monitoring  Patient Name: ABDULKAREEM Rice is a 81 y.o. male Date: 05/27/2021 Primary Care Physican: Lucianne Lei, MD Primary Cardiologist: [name] Electrophysiologist: Vergie Living Pacing:  >99%       05/27/2021 Weight: 306 lbs                                           Spoke with patient and heart failure questions reviewed.  Pt asymptomatic for fluid accumulation and feeling well.   Corvue Thoracic impedance suggesting normal fluid levels.    Prescribed: Furosemide 40 mg 1 tablet daily.   Labs: 07/08/2019 Creatinine 1.49, BUN 21, Potassium 4.4, Sodium 141, GFR 44-51 05/27/2019 Creatinine 1.36, BUN 18, Potassium 4.5, Sodium 145, GFR 49-57 A complete set of results can be found in Results Review.   Recommendations:  No changes and encouraged to call if experiencing any fluid symptoms.   Follow-up plan: ICM clinic phone appointment on 06/27/2021.  91 day device clinic remote transmission 07/28/2021.       EP/Cardiology Office Visits:  Recall 11/14/2021 with Dr. Debara Pickett.  Recall 11/11/2021 with Dr [name].     Copy of ICM check sent to Dr. [name].     3 month ICM trend: 05/23/2021.    12-14 Month ICM trend:     Rosalene Billings, RN 05/27/2021 12:13 PM

## 2021-06-01 ENCOUNTER — Other Ambulatory Visit: Payer: Self-pay | Admitting: Internal Medicine

## 2021-06-02 ENCOUNTER — Other Ambulatory Visit (HOSPITAL_COMMUNITY): Payer: Medicare PPO

## 2021-06-10 ENCOUNTER — Ambulatory Visit (HOSPITAL_COMMUNITY): Payer: Medicare PPO | Attending: Cardiology

## 2021-06-10 ENCOUNTER — Other Ambulatory Visit: Payer: Self-pay

## 2021-06-10 DIAGNOSIS — I5022 Chronic systolic (congestive) heart failure: Secondary | ICD-10-CM | POA: Diagnosis not present

## 2021-06-10 LAB — ECHOCARDIOGRAM COMPLETE
Area-P 1/2: 1.89 cm2
S' Lateral: 4.1 cm

## 2021-06-10 MED ORDER — PERFLUTREN LIPID MICROSPHERE
3.0000 mL | INTRAVENOUS | Status: AC | PRN
  Administered 2021-06-10: 3 mL via INTRAVENOUS

## 2021-06-27 ENCOUNTER — Ambulatory Visit (INDEPENDENT_AMBULATORY_CARE_PROVIDER_SITE_OTHER): Payer: Medicare PPO

## 2021-06-27 DIAGNOSIS — Z95810 Presence of automatic (implantable) cardiac defibrillator: Secondary | ICD-10-CM

## 2021-06-27 DIAGNOSIS — I5022 Chronic systolic (congestive) heart failure: Secondary | ICD-10-CM | POA: Diagnosis not present

## 2021-06-29 NOTE — Progress Notes (Signed)
EPIC Encounter for ICM Monitoring ? ?Patient Name: Rickey Rice is a 81 y.o. male ?Date: 06/29/2021 ?Primary Care Physican: Lucianne Lei, MD ?Primary Cardiologist: [name] ?Electrophysiologist: [name] ?Bi-V Pacing:  >99%       ?05/27/2021 Weight: 306 lbs ?  ?                                        ?Spoke with patient and heart failure questions reviewed.  Pt asymptomatic for fluid accumulation and feeling well.  His sister is having some demenia and he is helping to support her.  ?  ?Corvue Thoracic impedance suggesting normal fluid levels.  ?  ?Prescribed: Furosemide 40 mg 1 tablet daily. ?  ?Labs: ?07/08/2019 Creatinine 1.49, BUN 21, Potassium 4.4, Sodium 141, GFR 44-51 ?05/27/2019 Creatinine 1.36, BUN 18, Potassium 4.5, Sodium 145, GFR 49-57 ?A complete set of results can be found in Results Review. ?  ?Recommendations:  No changes and encouraged to call if experiencing any fluid symptoms. ?  ?Follow-up plan: ICM clinic phone appointment on 08/01/2021.  91 day device clinic remote transmission 07/28/2021.     ?  ?EP/Cardiology Office Visits:  Recall 11/14/2021 with Dr. Debara Pickett.  Recall 11/11/2021 with Dr [name].   ?  ?Copy of ICM check sent to Dr. [name].  ? ?3 month ICM trend: 06/25/2021. ? ? ? ?Rosalene Billings, RN ?06/29/2021 ?12:09 PM ? ?

## 2021-07-28 ENCOUNTER — Ambulatory Visit (INDEPENDENT_AMBULATORY_CARE_PROVIDER_SITE_OTHER): Payer: Medicare PPO

## 2021-07-28 DIAGNOSIS — I5022 Chronic systolic (congestive) heart failure: Secondary | ICD-10-CM

## 2021-07-28 LAB — CUP PACEART REMOTE DEVICE CHECK
Battery Remaining Longevity: 23 mo
Battery Remaining Percentage: 28 %
Battery Voltage: 2.86 V
Brady Statistic AP VP Percent: 1 %
Brady Statistic AP VS Percent: 1 %
Brady Statistic AS VP Percent: 99 %
Brady Statistic AS VS Percent: 1 %
Brady Statistic RA Percent Paced: 1 %
Date Time Interrogation Session: 20230504020017
HighPow Impedance: 63 Ohm
HighPow Impedance: 63 Ohm
Implantable Lead Implant Date: 20160324
Implantable Lead Implant Date: 20160324
Implantable Lead Implant Date: 20160324
Implantable Lead Location: 753858
Implantable Lead Location: 753859
Implantable Lead Location: 753860
Implantable Lead Model: 7122
Implantable Pulse Generator Implant Date: 20160324
Lead Channel Impedance Value: 380 Ohm
Lead Channel Impedance Value: 460 Ohm
Lead Channel Impedance Value: 510 Ohm
Lead Channel Pacing Threshold Amplitude: 1 V
Lead Channel Pacing Threshold Amplitude: 1 V
Lead Channel Pacing Threshold Amplitude: 1.5 V
Lead Channel Pacing Threshold Pulse Width: 0.5 ms
Lead Channel Pacing Threshold Pulse Width: 0.5 ms
Lead Channel Pacing Threshold Pulse Width: 0.5 ms
Lead Channel Sensing Intrinsic Amplitude: 12 mV
Lead Channel Sensing Intrinsic Amplitude: 2.6 mV
Lead Channel Setting Pacing Amplitude: 2 V
Lead Channel Setting Pacing Amplitude: 2.5 V
Lead Channel Setting Pacing Amplitude: 2.5 V
Lead Channel Setting Pacing Pulse Width: 0.5 ms
Lead Channel Setting Pacing Pulse Width: 0.5 ms
Lead Channel Setting Sensing Sensitivity: 0.5 mV
Pulse Gen Serial Number: 7240350

## 2021-07-29 DIAGNOSIS — B350 Tinea barbae and tinea capitis: Secondary | ICD-10-CM | POA: Diagnosis not present

## 2021-07-29 DIAGNOSIS — I10 Essential (primary) hypertension: Secondary | ICD-10-CM | POA: Diagnosis not present

## 2021-07-29 DIAGNOSIS — Z0000 Encounter for general adult medical examination without abnormal findings: Secondary | ICD-10-CM | POA: Diagnosis not present

## 2021-07-29 DIAGNOSIS — E1169 Type 2 diabetes mellitus with other specified complication: Secondary | ICD-10-CM | POA: Diagnosis not present

## 2021-08-01 ENCOUNTER — Ambulatory Visit (INDEPENDENT_AMBULATORY_CARE_PROVIDER_SITE_OTHER): Payer: Medicare PPO

## 2021-08-01 DIAGNOSIS — Z95810 Presence of automatic (implantable) cardiac defibrillator: Secondary | ICD-10-CM | POA: Diagnosis not present

## 2021-08-01 DIAGNOSIS — I5022 Chronic systolic (congestive) heart failure: Secondary | ICD-10-CM | POA: Diagnosis not present

## 2021-08-01 NOTE — Progress Notes (Signed)
EPIC Encounter for ICM Monitoring ? ?Patient Name: Rickey Rice is a 81 y.o. male ?Date: 08/01/2021 ?Primary Care Physican: Lucianne Lei, MD ?Primary Cardiologist: [name] ?Electrophysiologist: [name] ?Bi-V Pacing:  >99%       ?05/27/2021 Weight: 306 lbs ?  ?                                        ?Spoke with patient and heart failure questions reviewed.  Pt asymptomatic for fluid accumulation.  Reports feeling well at this time and voices no complaints.   ?  ?Corvue Thoracic impedance suggesting fluid levels trending close to normal.  ?  ?Prescribed: Furosemide 40 mg 1 tablet daily. ?  ?Labs: ?07/08/2019 Creatinine 1.49, BUN 21, Potassium 4.4, Sodium 141, GFR 44-51 ?05/27/2019 Creatinine 1.36, BUN 18, Potassium 4.5, Sodium 145, GFR 49-57 ?A complete set of results can be found in Results Review. ?  ?Recommendations:  No changes and encouraged to call if experiencing any fluid symptoms. ?  ?Follow-up plan: ICM clinic phone appointment on 09/05/2021.  91 day device clinic remote transmission 10/27/2021.     ?  ?EP/Cardiology Office Visits:  Recall 11/14/2021 with Dr. Debara Pickett.  Recall 11/11/2021 with Dr [name].   ?  ?Copy of ICM check sent to Dr. [name].  ? ?3 month ICM trend: 08/01/2021. ? ? ? ?12-14 Month ICM trend:  ? ? ? ?Rosalene Billings, RN ?08/01/2021 ?7:42 AM ? ?

## 2021-08-08 ENCOUNTER — Ambulatory Visit: Payer: Medicare PPO | Admitting: Podiatry

## 2021-08-08 ENCOUNTER — Ambulatory Visit (INDEPENDENT_AMBULATORY_CARE_PROVIDER_SITE_OTHER): Payer: Medicare PPO

## 2021-08-08 ENCOUNTER — Encounter: Payer: Self-pay | Admitting: Podiatry

## 2021-08-08 DIAGNOSIS — M79674 Pain in right toe(s): Secondary | ICD-10-CM

## 2021-08-08 DIAGNOSIS — M722 Plantar fascial fibromatosis: Secondary | ICD-10-CM | POA: Diagnosis not present

## 2021-08-08 DIAGNOSIS — E1149 Type 2 diabetes mellitus with other diabetic neurological complication: Secondary | ICD-10-CM

## 2021-08-08 DIAGNOSIS — B351 Tinea unguium: Secondary | ICD-10-CM | POA: Diagnosis not present

## 2021-08-08 DIAGNOSIS — M79675 Pain in left toe(s): Secondary | ICD-10-CM | POA: Diagnosis not present

## 2021-08-08 DIAGNOSIS — E1140 Type 2 diabetes mellitus with diabetic neuropathy, unspecified: Secondary | ICD-10-CM | POA: Diagnosis not present

## 2021-08-10 NOTE — Progress Notes (Signed)
Subjective:  ? ?Patient ID: Rickey Rice, male   DOB: 81 y.o.   MRN: [id number]  ? ?HPI ?Patient presents stating that he is getting some pain in his heel of a generalized nature not severe and also has big problems with his nailbeds that he cannot cut they get thick and incurvated and sore and he tries to work on them himself.  Presents with caregiver does not smoke likes to be active ? ? ?Review of Systems  ?All other systems reviewed and are negative. ? ? ?   ?Objective:  ?Physical Exam ?Vitals and nursing note reviewed.  ?Constitutional:   ?   Appearance: He is well-developed.  ?Pulmonary:  ?   Effort: Pulmonary effort is normal.  ?Musculoskeletal:     ?   General: Normal range of motion.  ?Skin: ?   General: Skin is warm.  ?Neurological:  ?   Mental Status: He is alert.  ?  ?Neurovascular status found to be mildly diminished as far as sharp dull vibratory and pulses are mildly diminished.  Patient is found to have mild discomfort in the heel but not severe and is noted to have moderate flatfoot deformity with thick yellow brittle nailbeds 1-5 both feet that he cannot cut and can become tender.  Patient does have obesity which is a complicating factor certainly at his age ? ?   ?Assessment:  ?A lot of stress on his feet secondary to obesity with moderate flatfoot and mild heel pain that he does keep under control with shoe gear modifications along with thick yellow mycotic nail infections 1-5 both feet with pain ? ?   ?Plan:  ?H&P x-rays reviewed condition discussed debridement of nailbeds 1-5 both feet no iatrogenic bleeding reappoint routine care as needed and instructed on possible treatment for Planter fasciitis if it were to get worse ? 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   ? ? ?

## 2021-08-10 NOTE — Progress Notes (Signed)
Remote ICD transmission.   

## 2021-08-22 ENCOUNTER — Other Ambulatory Visit: Payer: Self-pay | Admitting: Internal Medicine

## 2021-09-05 ENCOUNTER — Ambulatory Visit (INDEPENDENT_AMBULATORY_CARE_PROVIDER_SITE_OTHER): Payer: Medicare PPO

## 2021-09-05 DIAGNOSIS — I5022 Chronic systolic (congestive) heart failure: Secondary | ICD-10-CM

## 2021-09-05 DIAGNOSIS — Z95810 Presence of automatic (implantable) cardiac defibrillator: Secondary | ICD-10-CM | POA: Diagnosis not present

## 2021-09-06 NOTE — Progress Notes (Signed)
EPIC Encounter for ICM Monitoring  Patient Name: Rickey Rice is a 81 y.o. male Date: 09/06/2021 Primary Care Physican: Lucianne Lei, MD Primary Cardiologist: [name] Electrophysiologist: Vergie Living Pacing:  >99%       05/27/2021 Weight: 306 lbs                                           Spoke with patient and heart failure questions reviewed.  Pt asymptomatic for fluid accumulation.  Reports having some tightness in right leg.    Corvue Thoracic impedance suggesting normal fluid levels.    Prescribed: Furosemide 20 mg take 1 tablet by mouth daily.   Labs: 07/08/2019 Creatinine 1.49, BUN 21, Potassium 4.4, Sodium 141, GFR 44-51 05/27/2019 Creatinine 1.36, BUN 18, Potassium 4.5, Sodium 145, GFR 49-57 A complete set of results can be found in Results Review.   Recommendations:  No changes and encouraged to call if experiencing any fluid symptoms.   Follow-up plan: ICM clinic phone appointment on 10/10/2021.  91 day device clinic remote transmission 10/27/2021.       EP/Cardiology Office Visits:  Recall 11/14/2021 with Dr. Debara Pickett.  Recall 11/11/2021 with Dr [name].     Copy of ICM check sent to Dr. [name].   3 month ICM trend: 09/05/2021.    12-14 Month ICM trend:     Rosalene Billings, RN 09/06/2021 3:54 PM

## 2021-09-29 DIAGNOSIS — Z6841 Body Mass Index (BMI) 40.0 and over, adult: Secondary | ICD-10-CM | POA: Diagnosis not present

## 2021-09-29 DIAGNOSIS — E1169 Type 2 diabetes mellitus with other specified complication: Secondary | ICD-10-CM | POA: Diagnosis not present

## 2021-09-29 DIAGNOSIS — E031 Congenital hypothyroidism without goiter: Secondary | ICD-10-CM | POA: Diagnosis not present

## 2021-09-29 DIAGNOSIS — I10 Essential (primary) hypertension: Secondary | ICD-10-CM | POA: Diagnosis not present

## 2021-10-02 ENCOUNTER — Encounter (HOSPITAL_COMMUNITY): Payer: Self-pay | Admitting: *Deleted

## 2021-10-02 ENCOUNTER — Emergency Department (HOSPITAL_COMMUNITY): Payer: Medicare PPO

## 2021-10-02 ENCOUNTER — Emergency Department (HOSPITAL_COMMUNITY)
Admission: EM | Admit: 2021-10-02 | Discharge: 2021-10-02 | Disposition: A | Discharge status: Home / Self Care | Payer: Medicare PPO | Attending: Emergency Medicine | Admitting: Emergency Medicine

## 2021-10-02 ENCOUNTER — Other Ambulatory Visit: Payer: Self-pay

## 2021-10-02 DIAGNOSIS — S62024A Nondisplaced fracture of middle third of navicular [scaphoid] bone of right wrist, initial encounter for closed fracture: Secondary | ICD-10-CM

## 2021-10-02 DIAGNOSIS — S6991XA Unspecified injury of right wrist, hand and finger(s), initial encounter: Secondary | ICD-10-CM | POA: Diagnosis present

## 2021-10-02 DIAGNOSIS — M25461 Effusion, right knee: Secondary | ICD-10-CM

## 2021-10-02 DIAGNOSIS — S52514A Nondisplaced fracture of right radial styloid process, initial encounter for closed fracture: Secondary | ICD-10-CM | POA: Diagnosis not present

## 2021-10-02 DIAGNOSIS — Z79899 Other long term (current) drug therapy: Secondary | ICD-10-CM | POA: Insufficient documentation

## 2021-10-02 DIAGNOSIS — S62001A Unspecified fracture of navicular [scaphoid] bone of right wrist, initial encounter for closed fracture: Secondary | ICD-10-CM | POA: Diagnosis not present

## 2021-10-02 DIAGNOSIS — I129 Hypertensive chronic kidney disease with stage 1 through stage 4 chronic kidney disease, or unspecified chronic kidney disease: Secondary | ICD-10-CM | POA: Diagnosis not present

## 2021-10-02 DIAGNOSIS — Z7982 Long term (current) use of aspirin: Secondary | ICD-10-CM | POA: Diagnosis not present

## 2021-10-02 DIAGNOSIS — N189 Chronic kidney disease, unspecified: Secondary | ICD-10-CM | POA: Insufficient documentation

## 2021-10-02 DIAGNOSIS — E1122 Type 2 diabetes mellitus with diabetic chronic kidney disease: Secondary | ICD-10-CM | POA: Diagnosis not present

## 2021-10-02 DIAGNOSIS — Z96651 Presence of right artificial knee joint: Secondary | ICD-10-CM | POA: Diagnosis not present

## 2021-10-02 DIAGNOSIS — W108XXA Fall (on) (from) other stairs and steps, initial encounter: Secondary | ICD-10-CM | POA: Insufficient documentation

## 2021-10-02 DIAGNOSIS — M7989 Other specified soft tissue disorders: Secondary | ICD-10-CM | POA: Diagnosis not present

## 2021-10-02 MED ORDER — OXYCODONE-ACETAMINOPHEN 5-325 MG PO TABS
1.0000 | ORAL_TABLET | Freq: Once | ORAL | Status: AC
  Administered 2021-10-02: 1 via ORAL
  Filled 2021-10-02: qty 1

## 2021-10-02 MED ORDER — OXYCODONE-ACETAMINOPHEN 5-325 MG PO TABS: 1.0000 | ORAL_TABLET | Freq: Four times a day (QID) | ORAL | 0 refills | Status: DC | PRN

## 2021-10-02 MED ORDER — HYDROCODONE-ACETAMINOPHEN 5-325 MG PO TABS
1.0000 | ORAL_TABLET | Freq: Once | ORAL | Status: AC
  Administered 2021-10-02: 1 via ORAL
  Filled 2021-10-02: qty 1

## 2021-10-02 NOTE — Discharge Instructions (Addendum)
I recommend continued ice and elevation of your wrist in your knee to help with pain and swelling.  You may add a heating pad to your knee only starting on Tuesday for 20 minutes several times daily (don't apply heat to your wrist until you are evaluated by the orthopedist).  Take the pain medication as prescribed as needed for pain relief.  Do not drive within 4 hours of taking this medication as it may make you drowsy as discussed.

## 2021-10-02 NOTE — ED Triage Notes (Signed)
Pt states he fell last night landing on his right wrist and right knee; pt has swelling to both

## 2021-10-02 NOTE — ED Provider Notes (Signed)
[facility] EMERGENCY DEPARTMENT Provider Note   CSN: [id number] Arrival date & time: 10/02/21  8938     History  Chief Complaint  Patient presents with   Lytle Michaels    Rickey Rice is a 81 y.o. male with a history including arthritis with bilateral total knee arthroplasty, type 2 diabetes, chronic kidney disease, hypertension presenting for evaluation of right wrist and right knee pain after tripping and falling yesterday evening.  He was walking into a building and there was a 1 step down into the building which he missed and landed on his right knee and his right outstretched wrist.  He "shook it off" but later noted significant baseball sized swelling to the right knee which has improved with use of ice packs but still has significant tenderness.  Additionally he has had swelling and persistent pain at his right wrist.  He denies weakness in his hand, but has noted a faint tingling sensation of his thumb and index finger.  He has applied ice to the wrist as well.  He took a Tylenol prior to arrival with no relief of symptoms.  He denies head injury, LOC, denies chest pain, shortness of breath, no neck or back pain, denies hip pain and has been ambulatory using his cane.  He is not on anticoagulants.  The history is provided by the patient.       Home Medications Prior to Admission medications   Medication Sig Start Date End Date Taking? Authorizing Provider  ENTRESTO 97-103 MG TAKE 1 TABLET BY MOUTH TWICE DAILY 08/23/21   [name], Nadean Corwin, MD  oxyCODONE-acetaminophen (PERCOCET/ROXICET) 5-325 MG tablet Take 1 tablet by mouth every 6 (six) hours as needed. 10/02/21  Yes Wilmore Holsomback, Almyra Free, PA-C  aspirin EC 81 MG tablet Take 81 mg by mouth daily.    [provider]  brimonidine (ALPHAGAN) 0.2 % ophthalmic solution Place 1 drop into the left eye 3 (three) times daily.    [provider]  carboxymethylcellulose (REFRESH PLUS) 0.5 % SOLN Place 1 drop into both eyes daily.    [provider]  carvedilol (COREG) 12.5 MG tablet TAKE 1 TABLET BY MOUTH TWICE DAILY WITH A MEAL 06/01/21   [name], Nadean Corwin, MD  dapagliflozin propanediol (FARXIGA) 10 MG TABS tablet Take 10 mg by mouth daily.    [provider]  furosemide (LASIX) 20 MG tablet Take 20 mg by mouth daily.    [provider]  glucose blood test strip USE AS DIRECTED TO CHECK BLOOD SUGAR BID 01/21/18   [provider]  levothyroxine (SYNTHROID, LEVOTHROID) 75 MCG tablet Take 75 mcg by mouth every evening. Monday through Saturday    [provider]  Multiple Vitamins-Minerals (ICAPS AREDS 2) CAPS Take 1 capsule by mouth 2 (two) times daily.    [provider]  Omega-3 Fatty Acids (OMEGA 3 PO) Take 1,600 mg by mouth in the morning and at bedtime.    [provider]  rosuvastatin (CRESTOR) 10 MG tablet Take 1 tablet (10 mg total) by mouth daily at 6 PM. 06/19/14   Mikhail, Velta Addison, DO  saxagliptin HCl (ONGLYZA) 5 MG TABS tablet Take 5 mg by mouth daily.    [provider]  spironolactone (ALDACTONE) 25 MG tablet TAKE 1/2 TABLET(12.5 MG) BY MOUTH DAILY 01/13/21   [name], Nadean Corwin, MD      Allergies    Bee venom, Demerol [meperidine], Pioglitazone, Shellfish allergy, and Sulfa antibiotics    Review of Systems  Review of Systems  Constitutional:  Negative for fever.  HENT: Negative.    Respiratory: Negative.    Cardiovascular: Negative.   Gastrointestinal: Negative.   Musculoskeletal:  Positive for arthralgias and joint swelling. Negative for myalgias.  Neurological:  Negative for dizziness, syncope, weakness, numbness and headaches.  All other systems reviewed and are negative.   Physical Exam Updated Vital Signs BP 135/85 (BP Location: Left Arm)   Pulse 67   Temp 98.1 F (36.7 C) (Oral)   Resp 20   Ht 5' 11.5" (1.816 m)   Wt (!) 137.4 kg   SpO2 98%   BMI 41.67 kg/m  Physical Exam Constitutional:      Appearance: He is well-developed.   HENT:     Head: Normocephalic and atraumatic.  Eyes:     Extraocular Movements: Extraocular movements intact.  Cardiovascular:     Comments: Pulses equal bilaterally Pulmonary:     Effort: Pulmonary effort is normal. No respiratory distress.  Chest:     Chest wall: No tenderness.  Musculoskeletal:        General: Swelling and tenderness present.     Right wrist: Swelling and bony tenderness present. No deformity. Normal pulse.     Cervical back: Normal and normal range of motion. No bony tenderness.     Thoracic back: Normal. No bony tenderness.     Lumbar back: Normal. No bony tenderness.     Right knee: Swelling and bony tenderness present. No deformity.     Comments: Well-healed surgical incision  Skin:    General: Skin is warm and dry.  Neurological:     Mental Status: He is alert.     Sensory: No sensory deficit.     Motor: No weakness.     Deep Tendon Reflexes: Reflexes normal.     ED Results / Procedures / Treatments   Labs (all labs ordered are listed, but only abnormal results are displayed) Labs Reviewed - No data to display  EKG None  Radiology DG Knee Complete 4 Views Right  Result Date: 10/02/2021 CLINICAL DATA:  Fall, pain, swelling EXAM: RIGHT KNEE - COMPLETE 4+ VIEW COMPARISON:  None Available. FINDINGS: There is a right total knee arthroplasty in normal alignment without evidence of loosening or acute periprosthetic fracture. Mild bony irregularity along the posterior lateral tibial tray, which appears chronic. There is a trace joint effusion. There is prominent prepatellar soft tissue swelling. Borderline patella Alta. Vascular calcifications. IMPRESSION: Right total knee arthroplasty without evidence of loosening or acute periprosthetic fracture. Trace joint effusion. Prominent prepatellar soft tissue swelling. Borderline patella Alta. Electronically Signed   By: Maurine Simmering M.D.   On: 10/02/2021 11:22   DG Wrist Complete Right  Result Date:  10/02/2021 CLINICAL DATA:  Fall EXAM: RIGHT WRIST - COMPLETE 3+ VIEW COMPARISON:  None Available. FINDINGS: There is a suspected nondisplaced scaphoid waist fracture. No evidence of additional fracture in the hand. There is a potential nondisplaced fracture of the radial styloid. Negative ulnar variance. Vascular calcifications. 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 There is soft tissue swelling of the wrist. IMPRESSION: Findings suspicious for nondisplaced fractures of the scaphoid waist and of the radial styloid. Electronically Signed   By: Maurine Simmering M.D.   On: 10/02/2021 11:19    Procedures Procedures     SPLINT APPLICATION Date/Time: 7:98 PM Authorized by: Evalee Jefferson Consent: Verbal consent obtained. Risks and benefits: risks, benefits and alternatives were discussed Consent given by: patient Splint applied by: RN Location  details: Right upper extremity Splint type: Radial gutter Supplies used: Webril, splinting, Ace wrap's, sling. Post-procedure: The splinted body part was neurovascularly unchanged following the procedure. Patient tolerance: Patient tolerated the procedure well with no immediate complications.   Medications Ordered in ED Medications  HYDROcodone-acetaminophen (NORCO/VICODIN) 5-325 MG per tablet 1 tablet (1 tablet Oral Given 10/02/21 1101)  oxyCODONE-acetaminophen (PERCOCET/ROXICET) 5-325 MG per tablet 1 tablet (1 tablet Oral Given 10/02/21 1205)    ED Course/ Medical Decision Making/ A&P                           Medical Decision Making Patient presenting with right wrist and right knee pain after a trip and fall when he missed a step occurring yesterday.  No complaint of head injury, no headaches, nausea, focal weakness.  Imaging reviewed, small effusion in the right knee otherwise hardware appears stable.  He does have 2 small nondisplaced fractures of the radial styloid and the scaphoid.  Patient to be referred to orthopedics.  He was referred to Dr.  Mardelle Matte who is our hand specialist for today.  He was placed in a radial gutter splint and given a sling, Percocet prescribed with caution regarding sedation.  He was initially given hydrocodone tablet here which did not improve his pain, therefore increased to Percocet.  Discussed other home treatment including ice and elevation.  Amount and/or Complexity of Data Reviewed Radiology: ordered and independent interpretation performed.    Details: Mid scaphoid and radial styloid nondisplaced fractures.  Risk Prescription drug management.           Final Clinical Impression(s) / ED Diagnoses Final diagnoses:  Closed nondisplaced fracture of middle third of scaphoid bone of right wrist, initial encounter  Closed nondisplaced fracture of styloid process of right radius, initial encounter  Knee effusion, right    Rx / DC Orders ED Discharge Orders          Ordered    oxyCODONE-acetaminophen (PERCOCET/ROXICET) 5-325 MG tablet  Every 6 hours PRN        10/02/21 1231              Evalee Jefferson, PA-C 10/02/21 1324    Godfrey Pick, MD 10/02/21 [phone]

## 2021-10-05 ENCOUNTER — Other Ambulatory Visit (HOSPITAL_COMMUNITY): Payer: Self-pay | Admitting: Orthopedic Surgery

## 2021-10-05 ENCOUNTER — Other Ambulatory Visit: Payer: Self-pay | Admitting: Orthopedic Surgery

## 2021-10-05 DIAGNOSIS — S62001A Unspecified fracture of navicular [scaphoid] bone of right wrist, initial encounter for closed fracture: Secondary | ICD-10-CM | POA: Diagnosis not present

## 2021-10-10 ENCOUNTER — Ambulatory Visit (INDEPENDENT_AMBULATORY_CARE_PROVIDER_SITE_OTHER): Payer: Medicare PPO

## 2021-10-10 DIAGNOSIS — I5022 Chronic systolic (congestive) heart failure: Secondary | ICD-10-CM

## 2021-10-10 DIAGNOSIS — Z95810 Presence of automatic (implantable) cardiac defibrillator: Secondary | ICD-10-CM

## 2021-10-11 DIAGNOSIS — S63501A Unspecified sprain of right wrist, initial encounter: Secondary | ICD-10-CM | POA: Diagnosis not present

## 2021-10-11 DIAGNOSIS — Z96651 Presence of right artificial knee joint: Secondary | ICD-10-CM | POA: Diagnosis not present

## 2021-10-11 DIAGNOSIS — W19XXXA Unspecified fall, initial encounter: Secondary | ICD-10-CM | POA: Diagnosis not present

## 2021-10-11 NOTE — Progress Notes (Signed)
EPIC Encounter for ICM Monitoring  Patient Name: Rickey Rice is a 81 y.o. male Date: 10/11/2021 Primary Care Physican: Lucianne Lei, MD Primary Cardiologist: [name] Electrophysiologist: Vergie Living Pacing:  >99%       05/27/2021 Weight: 306 lbs 10/11/2021 Weight: 303-306 lbs                                           Spoke with patient and heart failure questions reviewed.  Pt asymptomatic for fluid accumulation. [place] and [place].    Corvue Thoracic impedance suggesting normal fluid levels.    Prescribed: Furosemide 20 mg take 1 tablet by mouth daily.   Labs: 07/08/2019 Creatinine 1.49, BUN 21, Potassium 4.4, Sodium 141, GFR 44-51 05/27/2019 Creatinine 1.36, BUN 18, Potassium 4.5, Sodium 145, GFR 49-57 A complete set of results can be found in Results Review.   Recommendations:  No changes and encouraged to call if experiencing any fluid symptoms.   Follow-up plan: ICM clinic phone appointment on 11/14/2021.  91 day device clinic remote transmission 10/27/2021.       EP/Cardiology Office Visits:  Recall 11/14/2021 with Dr. Debara Pickett.  Recall 11/11/2021 with Dr [name].     Copy of ICM check sent to Dr. [name].   3 month ICM trend: 10/10/2021.    12-14 Month ICM trend:     Rosalene Billings, RN 10/11/2021 4:38 PM

## 2021-10-14 ENCOUNTER — Encounter (HOSPITAL_COMMUNITY): Payer: Self-pay

## 2021-10-14 ENCOUNTER — Ambulatory Visit (HOSPITAL_COMMUNITY): Payer: Medicare PPO

## 2021-10-20 DIAGNOSIS — M25531 Pain in right wrist: Secondary | ICD-10-CM | POA: Diagnosis not present

## 2021-10-27 ENCOUNTER — Ambulatory Visit (INDEPENDENT_AMBULATORY_CARE_PROVIDER_SITE_OTHER): Payer: Medicare PPO

## 2021-10-27 DIAGNOSIS — I428 Other cardiomyopathies: Secondary | ICD-10-CM

## 2021-10-27 LAB — CUP PACEART REMOTE DEVICE CHECK
Battery Remaining Longevity: 24 mo
Battery Remaining Percentage: 28 %
Battery Voltage: 2.84 V
Brady Statistic AP VP Percent: 1 %
Brady Statistic AP VS Percent: 1 %
Brady Statistic AS VP Percent: 99 %
Brady Statistic AS VS Percent: 1 %
Brady Statistic RA Percent Paced: 1 %
Date Time Interrogation Session: 20230803020017
HighPow Impedance: 60 Ohm
HighPow Impedance: 60 Ohm
Implantable Lead Implant Date: 20160324
Implantable Lead Implant Date: 20160324
Implantable Lead Implant Date: 20160324
Implantable Lead Location: 753858
Implantable Lead Location: 753859
Implantable Lead Location: 753860
Implantable Lead Model: 7122
Implantable Pulse Generator Implant Date: 20160324
Lead Channel Impedance Value: 360 Ohm
Lead Channel Impedance Value: 450 Ohm
Lead Channel Impedance Value: 510 Ohm
Lead Channel Pacing Threshold Amplitude: 1 V
Lead Channel Pacing Threshold Amplitude: 1 V
Lead Channel Pacing Threshold Amplitude: 1.5 V
Lead Channel Pacing Threshold Pulse Width: 0.5 ms
Lead Channel Pacing Threshold Pulse Width: 0.5 ms
Lead Channel Pacing Threshold Pulse Width: 0.5 ms
Lead Channel Sensing Intrinsic Amplitude: 12 mV
Lead Channel Sensing Intrinsic Amplitude: 2.2 mV
Lead Channel Setting Pacing Amplitude: 2 V
Lead Channel Setting Pacing Amplitude: 2.5 V
Lead Channel Setting Pacing Amplitude: 2.5 V
Lead Channel Setting Pacing Pulse Width: 0.5 ms
Lead Channel Setting Pacing Pulse Width: 0.5 ms
Lead Channel Setting Sensing Sensitivity: 0.5 mV
Pulse Gen Serial Number: 7240350

## 2021-11-03 DIAGNOSIS — R609 Edema, unspecified: Secondary | ICD-10-CM | POA: Diagnosis not present

## 2021-11-03 DIAGNOSIS — E1169 Type 2 diabetes mellitus with other specified complication: Secondary | ICD-10-CM | POA: Diagnosis not present

## 2021-11-03 DIAGNOSIS — F4381 Prolonged grief disorder: Secondary | ICD-10-CM | POA: Diagnosis not present

## 2021-11-14 ENCOUNTER — Ambulatory Visit (INDEPENDENT_AMBULATORY_CARE_PROVIDER_SITE_OTHER): Payer: Medicare PPO

## 2021-11-14 DIAGNOSIS — I5022 Chronic systolic (congestive) heart failure: Secondary | ICD-10-CM

## 2021-11-14 DIAGNOSIS — Z95810 Presence of automatic (implantable) cardiac defibrillator: Secondary | ICD-10-CM

## 2021-11-15 NOTE — Progress Notes (Signed)
EPIC Encounter for ICM Monitoring  Patient Name: Rickey Rice is a 81 y.o. male Date: 11/15/2021 Primary Care Physican: Lucianne Lei, MD Primary Cardiologist: [name] Electrophysiologist: Vergie Living Pacing:  >99%       05/27/2021 Weight: 306 lbs 10/11/2021 Weight: 303-306 lbs 11/15/2021 Weight: 297 lbs                                           Spoke with patient and heart failure questions reviewed.  Pt asymptomatic for fluid accumulation.  Reports feeling well at this time and voices no complaints.    Corvue Thoracic impedance suggesting dryness from 8/11-8/20 and returned to normal 8/21.    Prescribed:  Furosemide 40 mg take 1 tablet by mouth daily (prescribed by Dr Criss Rosales) but may be adjusted on 8/23.   Labs: 07/08/2019 Creatinine 1.49, BUN 21, Potassium 4.4, Sodium 141, GFR 44-51 05/27/2019 Creatinine 1.36, BUN 18, Potassium 4.5, Sodium 145, GFR 49-57 A complete set of results can be found in Results Review.   Recommendations:  No changes and encouraged to call if experiencing any fluid symptoms.   Follow-up plan: ICM clinic phone appointment on 12/19/2021.  91 day device clinic remote transmission 01/26/2022.       EP/Cardiology Office Visits: Advised to call offices to schedule visits with Dr Debara Pickett and Dr [name].  Recall 11/14/2021 with Dr. Debara Pickett.  Recall 11/11/2021 with Dr [name].     Copy of ICM check sent to Dr. [name].   3 month ICM trend: 11/14/2021.    12-14 Month ICM trend:     Rosalene Billings, RN 11/15/2021 7:53 AM

## 2021-11-16 DIAGNOSIS — R609 Edema, unspecified: Secondary | ICD-10-CM | POA: Diagnosis not present

## 2021-11-16 DIAGNOSIS — E6609 Other obesity due to excess calories: Secondary | ICD-10-CM | POA: Diagnosis not present

## 2021-11-17 NOTE — Progress Notes (Signed)
Remote ICD transmission.   

## 2021-11-18 ENCOUNTER — Ambulatory Visit: Payer: Self-pay

## 2021-11-18 NOTE — Patient Outreach (Signed)
  Care Coordination   Initial Visit Note   11/18/2021 Name: SANDOR ARBOLEDA MRN: [id number] DOB: 07-Oct-1940  BERTRUM HELMSTETTER is a 81 y.o. year old male who sees Lucianne Lei, MD for primary care. I spoke with  [name] by phone today.  What matters to the patients health and wellness today?  Doing well, no concerns    Goals Addressed             This Visit's Progress    COMPLETED: Care Coordination Activities       Care Coordination Interventions: SDoH screening performed - no resource challenges identified at this time Fall risk screening performed - patient is a moderate fall risk. Fall prevention reviewed Determined the patient does not have difficulty affording medication  Education provided on role of care coordination team - no follow up needed at this time Noted patient is overdue for annual wellness exam - encouraged the patient to schedule with Dr. Criss Rosales as desired Instructed patient to follow up with his primary care provider as needed        SDOH assessments and interventions completed:  Yes  SDOH Interventions Today    Flowsheet Row Most Recent Value  SDOH Interventions   Food Insecurity Interventions Intervention Not Indicated  Housing Interventions Intervention Not Indicated  Transportation Interventions Intervention Not Indicated        Care Coordination Interventions Activated:  Yes  Care Coordination Interventions:  Yes, provided   Follow up plan: No further intervention required.   Encounter Outcome:  Pt. Visit Completed   Daneen Schick, BSW, CDP Social Worker, Certified Dementia Practitioner Care Coordination [phone]

## 2021-11-18 NOTE — Patient Instructions (Signed)
Visit Information  Thank you for taking time to visit with me today. Please don't hesitate to contact me if I can be of assistance to you.   Following are the goals we discussed today:   Goals Addressed             This Visit's Progress    COMPLETED: Care Coordination Activities       Care Coordination Interventions: SDoH screening performed - no resource challenges identified at this time Fall risk screening performed - patient is a moderate fall risk. Fall prevention reviewed Determined the patient does not have difficulty affording medication  Education provided on role of care coordination team - no follow up needed at this time Noted patient is overdue for annual wellness exam - encouraged the patient to schedule with Dr. Criss Rosales as desired Instructed patient to follow up with his primary care provider as needed        Please call the care guide team at [phone] if you need to schedule an appointment with our care coordination team.  If you are experiencing a Mental Health or [place] or need someone to talk to, please call [phone]-TALK (toll free, 24 hour hotline)  Patient verbalizes understanding of instructions and care plan provided today and agrees to view in [facility]. Active MyChart status and patient understanding of how to access instructions and care plan via MyChart confirmed with patient.     No further follow up required: Please contact your primary care provider as needed  Daneen Schick, BSW, CDP Social Worker, Certified Dementia Practitioner Care Coordination [phone]

## 2021-11-23 DIAGNOSIS — E119 Type 2 diabetes mellitus without complications: Secondary | ICD-10-CM | POA: Diagnosis not present

## 2021-11-23 DIAGNOSIS — H04123 Dry eye syndrome of bilateral lacrimal glands: Secondary | ICD-10-CM | POA: Diagnosis not present

## 2021-11-23 DIAGNOSIS — H35033 Hypertensive retinopathy, bilateral: Secondary | ICD-10-CM | POA: Diagnosis not present

## 2021-11-23 DIAGNOSIS — Z961 Presence of intraocular lens: Secondary | ICD-10-CM | POA: Diagnosis not present

## 2021-11-23 DIAGNOSIS — H35373 Puckering of macula, bilateral: Secondary | ICD-10-CM | POA: Diagnosis not present

## 2021-11-23 DIAGNOSIS — H0102B Squamous blepharitis left eye, upper and lower eyelids: Secondary | ICD-10-CM | POA: Diagnosis not present

## 2021-11-23 DIAGNOSIS — H401211 Low-tension glaucoma, right eye, mild stage: Secondary | ICD-10-CM | POA: Diagnosis not present

## 2021-11-23 DIAGNOSIS — H401222 Low-tension glaucoma, left eye, moderate stage: Secondary | ICD-10-CM | POA: Diagnosis not present

## 2021-11-23 DIAGNOSIS — H0102A Squamous blepharitis right eye, upper and lower eyelids: Secondary | ICD-10-CM | POA: Diagnosis not present

## 2021-11-29 ENCOUNTER — Ambulatory Visit (HOSPITAL_BASED_OUTPATIENT_CLINIC_OR_DEPARTMENT_OTHER): Payer: Medicare PPO | Admitting: Internal Medicine

## 2021-11-29 ENCOUNTER — Encounter (HOSPITAL_BASED_OUTPATIENT_CLINIC_OR_DEPARTMENT_OTHER): Payer: Self-pay | Admitting: Internal Medicine

## 2021-11-29 VITALS — BP 108/62 | HR 62 | Ht 71.5 in | Wt 308.1 lb

## 2021-11-29 DIAGNOSIS — Z95810 Presence of automatic (implantable) cardiac defibrillator: Secondary | ICD-10-CM | POA: Diagnosis not present

## 2021-11-29 DIAGNOSIS — E119 Type 2 diabetes mellitus without complications: Secondary | ICD-10-CM | POA: Diagnosis not present

## 2021-11-29 DIAGNOSIS — I5022 Chronic systolic (congestive) heart failure: Secondary | ICD-10-CM | POA: Diagnosis not present

## 2021-11-29 DIAGNOSIS — G4733 Obstructive sleep apnea (adult) (pediatric): Secondary | ICD-10-CM

## 2021-11-29 DIAGNOSIS — Z9989 Dependence on other enabling machines and devices: Secondary | ICD-10-CM

## 2021-11-29 NOTE — Patient Instructions (Signed)
Medication Instructions:  Dr. Debara Pickett would like you to continue your current medications Please take furosemide (lasix) '20mg'$  daily   *If you need a refill on your cardiac medications before your next appointment, please call your pharmacy*   Follow-Up: At [facility], you and your health needs are our priority.  As part of our continuing mission to provide you with exceptional heart care, we have created designated Provider Care Teams.  These Care Teams include your primary Cardiologist (physician) and Advanced Practice Providers (APPs -  Physician Assistants and Nurse Practitioners) who all work together to provide you with the care you need, when you need it.  We recommend signing up for the patient portal called "MyChart".  Sign up information is provided on this After Visit Summary.  MyChart is used to connect with patients for Virtual Visits (Telemedicine).  Patients are able to view lab/test results, encounter notes, upcoming appointments, etc.  Non-urgent messages can be sent to your provider as well.   To learn more about what you can do with MyChart, go to [email or web address].    Your next appointment:   6 month(s)  The format for your next appointment:   In Person  Provider:   Pixie Casino, MD

## 2021-11-29 NOTE — Progress Notes (Addendum)
OFFICE NOTE  Chief Complaint:  Follow-up heart failure  Primary Care Physician: Lucianne Lei, MD  HPI:  Rickey Rice  is a 81 year old gentleman with a history of diabetes, hypertension, hypothyroidism, and morbid obesity. In 2010 he had complaints of chest pain and underwent stress testing with [facility] which was a 2-day nuclear stress test and was apparently negative. Recently he underwent a colonoscopy and a preoperative EKG was abnormal. I unfortunately do not have that EKG to review, but I did review the EKG from your office on April 09, 2012 which showed a borderline intraventricular conduction delay, a sinus rhythm at 66, and poor R-wave progression. Today in the office he has an abnormal EKG demonstrating a left bundle branch block with a QRS duration of 168 msec, heart rate of 78 in sinus. His only symptoms are shortness of breath with exertion. He underwent evaluation of his left bundle branch block in February 2014. This is a 2 day nuclear stress test which was negative for ischemia. There was a small inferior and apical defect which could represent scar versus artifact. EF was mildly reduced at 49%.    Rickey Rice returns today for followup. He underwent knee replacement surgery as we had authorized him to do. He is tolerated surgery without any complications. He reports that he is recovering fairly well and is more mobile than he had been in the past. He started to do some exercises at the [facility] and also this will translate into weight loss. Blood pressure has been stable he denies any chest pain.  He had blood work in January from his primary care provider which showed a total cholesterol of 128, HDL 51, triglycerides 53 and LDL 65. His hemoglobin A1c was 6.2.  Rickey Rice is now contemplating lethargy. Unfortunately has not been able to walk enough to lose weight in fact has gained some weight. 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 He feels like it's time to retire work on his generalized health. I would tend to agree with this.  I saw Rickey Rice back in the office today. He recently followed up from the hospital after having a sudden cardiac arrest. Unfortunately he was revived and is now status post AICD. He was to have CRT-D therapy, however there was difficulty with the LV lead, therefore he only has a single ventricle pacing with defibrillator functions. He is tolerating this well and had a small episode with fever postoperatively, but this was thought to be due to possibly a UTI. There is no evidence of pacemaker site fluctuance or swelling. Overall he feels he is doing well. He started exercising is managed to lose some weight. He is less short of breath and is more active.  Rickey Rice returns today for follow-up. Overall he is doing exceedingly well. He's managed to lose about 10 pounds. He is now below 300 pounds. His shortness of breath is improved some and he is ambulating better on his knees. He's had no device problems including no firings. He denies any chest pain or worsening shortness of breath and has not had any presyncopal or syncopal episodes.  Rickey Rice returns today for follow-up. Unfortunately he's gained some of his weight back. He denies a chest pain or worsening shortness of breath. He does not have any swelling. His last EF was as mentioned 40% in March 2016. EKG shows left bundle branch block. He scheduled to see Dr. [name] back for  device interrogation next month.  11/12/2015  Rickey Rice was seen today in follow-up. Unfortunately he was recently seen for some weight gain and fatigue by Rosaria Ferries, PA-C. She felt that he might be getting some extra fluid and recommended a short trial of increased Lasix. Weight is actually been fairly stable now at 300-301. Around that time he was found to have a UTI was treated for that and his symptoms improved. He was also noted to have  problems with infection and underwent multiple rounds of antibiotics. This led to problems with diarrhea or digestive problems and ultimately he went on probiotics. Recently, though he was talking on the phone and had an episode of transient global amnesia shortly after his wife left the house. He called her back and she came home and noted that he may have had an episode with his defibrillator. This was subsequently confirmed to be VT and he underwent a shock for that. He is seen Dr. [name] who increased his carvedilol to 12-1/2 mg twice a day. Blood pressure is actually a little low today but he reports being asymptomatic with that.  02/15/2016  Rickey Rice returns today for follow-up. He reports feeling well and denies worsening shortness of breath or chest pain. HE has not had any presyncopal spells. He just saw EP who noted his defibrillator was working properly. We again discussed strategies for heart failure management, including possibly switching lisinopril over to [facility]. He did not seem interested in this. It is noted his bp is higher today and that may at least allow uptitration of his lisinopril.  05/29/2016  Rickey Rice was seen today in follow-up. Weight is down slightly although has not made a significant improvement. He needs to start to do more exercise but he has blamed the weather and his arthritis. Blood pressure appears to be well-controlled and he brought readings in the office today. Although I do feel that there still potentially some room to consider adding Entresto. We discussed the medication today and it would effectively replace his lisinopril. He understands he would need to wash off the lisinopril for 36-48 hours before starting Entresto. We can provide him with samples and help him secure preauthorization.  12/28/2017  Rickey Rice is seen today in follow-up heart failure.  He reports NYHA class II symptoms, noting some shortness of breath with moderate exertion.  He has  recently had some weight loss.  He seems to be tolerating Entresto 24/26.  According to Dr. Olin Pia notes his LV lead is now activated.  He does appear to be biventricular pacing today.  Labs from July showed total cholesterol 143, HDL 52, LDL 79 triglycerides 42, hemoglobin A1c 5.8.  BNP in November was 155.  Recent remote check showed no change in thoracic impedance suggesting that he is compensated with his heart failure.  We previously discussed increasing the dose of Entresto as blood pressure would tolerate.  He is interested in getting a flu shot today.  11/13/2018  Rickey Rice is seen today back for follow-up of heart failure.  He continues to have NYHA class II symptoms.  He is struggling with weight.  Weight recently has gone up about 6 pounds.  He has been on the moderate dose of Entresto.  Unfortunately repeat echo earlier this year showed no significant improvement in LV function with his EF being 20 to 25%.  This is despite activation of his LV lead and biventricular pacing.  Remote check show stable thoracic impedance and no evidence of  volume overload.  Finally, he has been diagnosed with obstructive sleep apnea and will begin treatment shortly.  Hopefully this will be additionally helpful.  02/13/2019  Rickey Rice returns today for follow-up of his heart failure.  Overall he continues to have NYHA class II symptoms.  He is been able to do most activities without incident.  LVEF remains depressed.  There is been no evidence of volume overload recently on his biventricular pacemaker.  Remote checks are undergoing.  Weight is down a few pounds.  He is try to make some dietary changes.  I added Aldactone and he is now on highest dose Entresto.  Plan is to repeat an echo likely in a few months to see if there is been any interval improvement in LV function.  Also, he was diagnosed with obstructive sleep apnea but is not yet been fitted with a mask.  He said when he was treated with that during  the titration study, the next day he felt the best he is ever felt was able to do many activities and generally had a good energy level.  Hopefully based on his insurance plan he will be able to get equipment beginning in January.  01/07/2020  Rickey Rice seen today in follow-up of his heart failure.  He underwent an echo in February 2021 which does show a small improvement in LVEF up to 35 to 40%.  He continues to have remote defibrillator checks which have been fairly normal.  He denies any shocks.  He reports fairly stable fluid status although weight has gone up to about 312 pounds today from around 300 pounds in the past.  He reports less physical activity and needs to make more appropriate dietary changes.  Despite this blood pressure is well controlled today 116/70.  EKG shows a biventricular paced rhythm.  Lipid profile shows total cholesterol 124, HDL 46, LDL 65 and triglycerides of 47.  Hemoglobin A1c of 5.6.  05/18/2021  Rickey Rice returns today for follow-up.  In January he told me that he had a call from Dr. [name] that his device going off.  I do not see any evidence of this although he was noted to have mode switching and was noted to be in A-fib.  Dr. [name] noted that he had SCAF (subclinical device detected afib) -I assured him that if he had had a shock that he would have been aware of it.  He is overdue for an echocardiogram last was in February 2021 which showed EF up to 35 to 40%.  He has had weight gain but no worsening edema.  His wife had COVID-19 a few weeks ago but was treated for that.  He tested negative.  Recent labs 5 days ago showed total cholesterol 142, HDL 52, LDL 77 and triglycerides 57, A1c 6.4% creatinine 1.59.  11/29/2021  Rickey Rice is seen today for follow-up of heart failure.  Overall he says he is doing well.  Fairly recently had an increase in edema.  He had apparently cut back his furosemide to 10 mg daily.  He said that he was prescribed that although I never  typically will prescribe this low of a dose.  I had thought he was on 20 mg daily.  His PCP added 40 mg to the 10 mg daily dose for about a week at which time he had significant diuresis and improvement in lower extremity edema.  Subsequent to that he had a pacemaker check which showed hypovolemia.  He has subsequently  gone back to the 10 mg dose, he believes but he will go home and check that dose.  He denies any chest pain.  He has had no further ICD firing.  He has a in person defibrillator check with Dr. [name] in October.  EKG today shows a biventricular paced rhythm at 62.  PMHx:  Past Medical History:  Diagnosis Date   AICD (automatic cardioverter/defibrillator) present    Arthritis    oa   Cardiac arrest ([place]) 06/07/2014   Primary VF arrest with successful resuscitation and s/p ICD implant   Diabetes mellitus without complication ([place])    type 2   Dyslipidemia    History of nuclear stress test 04/2012   lexiscan - 2 day protocol; low risk study, evidence of inferrior & apical scar but no ischemia    Hypertension    Hypothyroidism    LBBB (left bundle branch block)    Morbid obesity (HCC)    NICM (nonischemic cardiomyopathy) ([place])    Prostate enlargement    pt denies    Past Surgical History:  Procedure Laterality Date   BI-VENTRICULAR IMPLANTABLE CARDIOVERTER DEFIBRILLATOR N/A 06/18/2014   STJ CRTD implanted by Dr [name]   COLONOSCOPY W/ BIOPSIES     COLONOSCOPY WITH PROPOFOL N/A 04/05/2017   Procedure: COLONOSCOPY WITH PROPOFOL;  Surgeon: Juanita Craver, MD;  Location: [facility];  Service: Endoscopy;  Laterality: N/A;   FINGER SURGERY  1954   1 st joint  right hand amputated   KNEE ARTHROSCOPY Left    LEFT HEART CATHETERIZATION WITH CORONARY ANGIOGRAM N/A 06/12/2014   Procedure: LEFT HEART CATHETERIZATION WITH CORONARY ANGIOGRAM;  Surgeon: Leonie Man, MD;  Location: [facility] LAB;  Service: Cardiovascular;  Laterality: N/A;   [place]   TOTAL KNEE ARTHROPLASTY  Right 02/05/2013   Procedure: TOTAL KNEE ARTHROPLASTY;  Surgeon: Kerin Salen, MD;  Location: [place];  Service: Orthopedics;  Laterality: Right;   TOTAL KNEE ARTHROPLASTY Left 01/19/2014   Procedure: LEFT TOTAL KNEE ARTHROPLASTY;  Surgeon: Kerin Salen, MD;  Location: [place];  Service: Orthopedics;  Laterality: Left;    FAMHx:  Family History  Problem Relation Age of Onset   Cancer Mother    Kidney disease Brother    Hypertension Sister     SOCHx:   reports that he has never smoked. He has never used smokeless tobacco. He reports current alcohol use. He reports that he does not use drugs.  ALLERGIES:  Allergies  Allergen Reactions   Bee Venom Swelling    Swelling on lips and tongue   Demerol [Meperidine]     Not sure   Pioglitazone     Lips swelling= actos   Shellfish Allergy     rash Other reaction(s): Unknown   Sulfa Antibiotics     Not sure   Shrimp Extract Allergy Skin Test Itching    Rash    ROS: Pertinent items noted in HPI and remainder of comprehensive ROS otherwise negative.  HOME MEDS: Current Outpatient Medications  Medication Sig Dispense Refill   aspirin EC 81 MG tablet Take 81 mg by mouth daily.     brimonidine (ALPHAGAN) 0.2 % ophthalmic solution Place 1 drop into the left eye 3 (three) times daily.     carboxymethylcellulose (REFRESH PLUS) 0.5 % SOLN Place 1 drop into both eyes daily.     carvedilol (COREG) 12.5 MG tablet TAKE 1 TABLET BY MOUTH TWICE DAILY WITH A MEAL 180 tablet 3   dapagliflozin propanediol (  FARXIGA) 10 MG TABS tablet Take 10 mg by mouth daily.     ENTRESTO 97-103 MG TAKE 1 TABLET BY MOUTH TWICE DAILY 60 tablet 9   furosemide (LASIX) 20 MG tablet Take 20 mg by mouth daily.     glucose blood test strip USE AS DIRECTED TO CHECK BLOOD SUGAR BID     levothyroxine (SYNTHROID, LEVOTHROID) 75 MCG tablet Take 75 mcg by mouth every evening. Monday through Saturday     Multiple Vitamins-Minerals (ICAPS AREDS 2) CAPS Take 1 capsule by mouth 2  (two) times daily.     Omega-3 Fatty Acids (OMEGA 3 PO) Take 1,600 mg by mouth in the morning and at bedtime.     oxyCODONE-acetaminophen (PERCOCET/ROXICET) 5-325 MG tablet Take 1 tablet by mouth every 6 (six) hours as needed. 20 tablet 0   rosuvastatin (CRESTOR) 10 MG tablet Take 1 tablet (10 mg total) by mouth daily at 6 PM. 30 tablet 0   saxagliptin HCl (ONGLYZA) 5 MG TABS tablet Take 5 mg by mouth daily.     spironolactone (ALDACTONE) 25 MG tablet TAKE 1/2 TABLET(12.5 MG) BY MOUTH DAILY 45 tablet 3   No current facility-administered medications for this visit.    LABS/IMAGING: No results found for this or any previous visit (from the past 48 hour(s)). No results found.  VITALS: BP 108/62   Pulse 62   Ht 5' 11.5" (1.816 m)   Wt (!) 308 lb 1.6 oz (139.8 kg)   SpO2 99%   BMI 42.37 kg/m   EXAM: General appearance: alert, no distress and morbidly obese Neck: no carotid bruit, no JVD and thyroid not enlarged, symmetric, no tenderness/mass/nodules Lungs: clear to auscultation bilaterally and AICD in left upper chest Heart: regular rate and rhythm, S1, S2 normal, no murmur, click, rub or gallop Abdomen: soft, non-tender; bowel sounds normal; no masses,  no organomegaly and Morbidly obese Extremities: extremities normal, atraumatic, no cyanosis or edema Pulses: 2+ and symmetric Skin: Skin color, texture, turgor normal. No rashes or lesions  EKG: Biventricular pacing at 62-personally reviewed  ASSESSMENT: Status post aborted sudden cardiac death-status post placement of a [employer] Bi-V AICD (LV lead is disabled due to dysfunction) Recent VT/VF with appropriate AICD shock Negative nuclear stress test for ischemia, fixed inferoapical defect suggesting possible scar versus artifact, EF 49% Diabetes type 2 Hypertension-well controlled Dyslipidemia on statin - well controlled Non-ischemic cardiomyopathy-EF 20-25%, improved to 35 to 40% (04/2019) 8.   OSA - not currently on  therapy  PLAN: 1.   Mr. Giovanetti appears euvolemic to slightly volume overloaded today with some lower extremity edema.  His most recent OptiVol was low volume status however this was after aggressive diuresis by his PCP.  He claims to be on 10 mg Lasix daily.  I would prefer him to be on 20 mg daily.  He should check his home dose and if he is on 20 mg remain on it.  Otherwise increase to 20 mg daily.  He has follow-up with Dr. [name] in October.  We will continue her current treatments.  Lipids in July were excellent but LDL still just slightly above goal at 78.  Follow-up with me in 6 months.  Pixie Casino, MD, [facility], [place] Director of the [facility] Lipid [facility] &  Cardiovascular Risk Reduction [facility] Diplomate of the American Board of Clinical Lipidology Attending Cardiologist  Direct Dial: [phone]  Fax: [phone]  Website:  [email or web address]   Nadean Corwin  Dmiyah Liscano 11/29/2021, 11:31 AM

## 2021-12-16 ENCOUNTER — Other Ambulatory Visit: Payer: Self-pay

## 2021-12-16 MED ORDER — FUROSEMIDE 20 MG PO TABS: 20.0000 mg | ORAL_TABLET | Freq: Every day | ORAL | 6 refills | Status: DC

## 2021-12-19 ENCOUNTER — Ambulatory Visit (INDEPENDENT_AMBULATORY_CARE_PROVIDER_SITE_OTHER): Payer: Medicare PPO

## 2021-12-19 DIAGNOSIS — Z95810 Presence of automatic (implantable) cardiac defibrillator: Secondary | ICD-10-CM

## 2021-12-19 DIAGNOSIS — I5022 Chronic systolic (congestive) heart failure: Secondary | ICD-10-CM

## 2021-12-22 ENCOUNTER — Telehealth: Payer: Self-pay

## 2021-12-22 DIAGNOSIS — E1122 Type 2 diabetes mellitus with diabetic chronic kidney disease: Secondary | ICD-10-CM | POA: Diagnosis not present

## 2021-12-22 DIAGNOSIS — N189 Chronic kidney disease, unspecified: Secondary | ICD-10-CM | POA: Diagnosis not present

## 2021-12-22 DIAGNOSIS — E1169 Type 2 diabetes mellitus with other specified complication: Secondary | ICD-10-CM | POA: Diagnosis not present

## 2021-12-22 DIAGNOSIS — E038 Other specified hypothyroidism: Secondary | ICD-10-CM | POA: Diagnosis not present

## 2021-12-22 DIAGNOSIS — Z6841 Body Mass Index (BMI) 40.0 and over, adult: Secondary | ICD-10-CM | POA: Diagnosis not present

## 2021-12-22 NOTE — Progress Notes (Signed)
EPIC Encounter for ICM Monitoring  Patient Name: Rickey Rice is a 81 y.o. male Date: 12/22/2021 Primary Care Physican: Lucianne Lei, MD Primary Cardiologist: [name] Electrophysiologist: Vergie Living Pacing:  >99%       05/27/2021 Weight: 306 lbs 10/11/2021 Weight: 303-306 lbs 11/15/2021 Weight: 297 lbs                                           Attempted call to patient and unable to reach.  Left detailed message per DPR regarding transmission. Transmission reviewed.    [place] Thoracic impedance suggesting normal fluid levels.    Prescribed:  Furosemide 20 mg take 1 tablet by mouth daily. Spironolactone 25 mg take 0.5 tablet (12.5 mg total) by mouth daily   Labs: 07/08/2019 Creatinine 1.49, BUN 21, Potassium 4.4, Sodium 141, GFR 44-51 05/27/2019 Creatinine 1.36, BUN 18, Potassium 4.5, Sodium 145, GFR 49-57 A complete set of results can be found in Results Review.   Recommendations:  Left voice mail with ICM number and encouraged to call if experiencing any fluid symptoms.   Follow-up plan: ICM clinic phone appointment on 01/23/2022.  91 day device clinic remote transmission 01/26/2022.       EP/Cardiology Office Visits:   6 month follow with Dr Debara Pickett due 05/30/2022 (no recall).  12/27/2021 with Dr [name].     Copy of ICM check sent to Dr. [name].   3 month ICM trend: 12/19/2021.    12-14 Month ICM trend:     Rosalene Billings, RN 12/22/2021 7:48 AM

## 2021-12-22 NOTE — Telephone Encounter (Signed)
Remote ICM transmission received.  Attempted call to patient regarding ICM remote transmission and left detailed message per DPR.  Advised to return call for any fluid symptoms or questions. Next ICM remote transmission scheduled 01/23/2022.

## 2021-12-26 ENCOUNTER — Other Ambulatory Visit: Payer: Self-pay

## 2021-12-27 ENCOUNTER — Encounter: Payer: Self-pay | Admitting: Internal Medicine

## 2021-12-27 ENCOUNTER — Ambulatory Visit: Payer: Medicare PPO | Attending: Internal Medicine | Admitting: Internal Medicine

## 2021-12-27 VITALS — BP 112/68 | HR 69 | Ht 71.5 in | Wt 303.4 lb

## 2021-12-27 DIAGNOSIS — I428 Other cardiomyopathies: Secondary | ICD-10-CM

## 2021-12-27 DIAGNOSIS — Z95810 Presence of automatic (implantable) cardiac defibrillator: Secondary | ICD-10-CM | POA: Diagnosis not present

## 2021-12-27 DIAGNOSIS — I472 Ventricular tachycardia, unspecified: Secondary | ICD-10-CM | POA: Diagnosis not present

## 2021-12-27 DIAGNOSIS — I5022 Chronic systolic (congestive) heart failure: Secondary | ICD-10-CM | POA: Diagnosis not present

## 2021-12-27 NOTE — Progress Notes (Signed)
Error      Patient Care Team: Lucianne Lei, MD as PCP - General (Family Medicine)   HPI  Rickey Rice is a 81 y.o. male Seen in follow-up for CRT-D implantation for aborted cardiac arrest in the setting of nonischemic cardiac myopathy and left bundle branch block.  Because of a short RV LV interval, it was ELECTED NOT to activate the LV leads  initially. We subsequently have  turned on the LV lead     Seen 6/22.  Volume overloaded.  Increase his diuretics.  Analysis PCP to consider an SGLT2  Closely followed by the ICM clinic chronic dyspnea.  No edema.  No chest pain  Date Cr K Hgb  11/17  1.24 4.2   5/22  1.16 4.1 11.4  7/23 1.4 4.5 12.4    DATE TEST EF   3/16 LHC  No obstructive CAD  3/16 Echo  40 %   8/17 Echo  30-35 %   2/21 Echo  34-40%   3/23 Echo  40-45%          Has sleep disordered breathing now on CPAP  Past Medical History:  Diagnosis Date   AICD (automatic cardioverter/defibrillator) present    Arthritis    oa   Cardiac arrest ([place]) 06/07/2014   Primary VF arrest with successful resuscitation and s/p ICD implant   Diabetes mellitus without complication ([place])    type 2   Dyslipidemia    History of nuclear stress test 04/2012   lexiscan - 2 day protocol; low risk study, evidence of inferrior & apical scar but no ischemia    Hypertension    Hypothyroidism    LBBB (left bundle branch block)    Morbid obesity (HCC)    NICM (nonischemic cardiomyopathy) ([place])    Prostate enlargement    pt denies    Past Surgical History:  Procedure Laterality Date   BI-VENTRICULAR IMPLANTABLE CARDIOVERTER DEFIBRILLATOR N/A 06/18/2014   STJ CRTD implanted by Dr [name]   COLONOSCOPY W/ BIOPSIES     COLONOSCOPY WITH PROPOFOL N/A 04/05/2017   Procedure: COLONOSCOPY WITH PROPOFOL;  Surgeon: Juanita Craver, MD;  Location: [facility];  Service: Endoscopy;  Laterality: N/A;   FINGER SURGERY  1954   1 st joint  right hand amputated   KNEE ARTHROSCOPY Left    LEFT HEART  CATHETERIZATION WITH CORONARY ANGIOGRAM N/A 06/12/2014   Procedure: LEFT HEART CATHETERIZATION WITH CORONARY ANGIOGRAM;  Surgeon: Leonie Man, MD;  Location: [facility] CATH LAB;  Service: Cardiovascular;  Laterality: N/A;   [place]   TOTAL KNEE ARTHROPLASTY Right 02/05/2013   Procedure: TOTAL KNEE ARTHROPLASTY;  Surgeon: Kerin Salen, MD;  Location: [place];  Service: Orthopedics;  Laterality: Right;   TOTAL KNEE ARTHROPLASTY Left 01/19/2014   Procedure: LEFT TOTAL KNEE ARTHROPLASTY;  Surgeon: Kerin Salen, MD;  Location: [place];  Service: Orthopedics;  Laterality: Left;    Current Outpatient Medications:    aspirin EC 81 MG tablet, Take 81 mg by mouth daily., Disp: , Rfl:    brimonidine (ALPHAGAN) 0.2 % ophthalmic solution, Place 1 drop into the left eye 3 (three) times daily., Disp: , Rfl:    carboxymethylcellulose (REFRESH PLUS) 0.5 % SOLN, Place 1 drop into both eyes daily., Disp: , Rfl:    carvedilol (COREG) 12.5 MG tablet, TAKE 1 TABLET BY MOUTH TWICE DAILY WITH A MEAL, Disp: 180 tablet, Rfl: 3   dapagliflozin propanediol (FARXIGA) 10 MG TABS tablet, Take 10 mg by mouth  daily., Disp: , Rfl:    ENTRESTO 97-103 MG, TAKE 1 TABLET BY MOUTH TWICE DAILY, Disp: 60 tablet, Rfl: 9   furosemide (LASIX) 20 MG tablet, Take 1 tablet (20 mg total) by mouth daily., Disp: 30 tablet, Rfl: 6   glucose blood test strip, USE AS DIRECTED TO CHECK BLOOD SUGAR BID, Disp: , Rfl:    levothyroxine (SYNTHROID, LEVOTHROID) 75 MCG tablet, Take 75 mcg by mouth every evening. Monday through Saturday, Disp: , Rfl:    Multiple Vitamins-Minerals (ICAPS AREDS 2) CAPS, Take 1 capsule by mouth 2 (two) times daily., Disp: , Rfl:    Omega-3 Fatty Acids (OMEGA 3 PO), Take 1,600 mg by mouth in the morning and at bedtime., Disp: , Rfl:    rosuvastatin (CRESTOR) 20 MG tablet, Take 20 mg by mouth daily., Disp: , Rfl:    saxagliptin HCl (ONGLYZA) 5 MG TABS tablet, Take 5 mg by mouth daily., Disp: , Rfl:    spironolactone  (ALDACTONE) 25 MG tablet, TAKE 1/2 TABLET(12.5 MG) BY MOUTH DAILY, Disp: 45 tablet, Rfl: 3 Discussed patient MONITOR  FOR Allergies  Allergen Reactions   Bee Venom Swelling    Swelling on lips and tongue   Demerol [Meperidine]     Not sure   Pioglitazone     Lips swelling= actos   Shrimp [Shellfish Allergy]     rash   Sulfa Antibiotics     Not sure      Review of Systems negative except from HPI and PMH  Physical Exam BP 112/68   Pulse 69   Ht 5' 11.5" (1.816 m)   Wt (!) 303 lb 6.4 oz (137.6 kg)   SpO2 98%   BMI 41.73 kg/m  Well developed and Morbidly obese in no acute distress HENT normal Neck supple with JVP-flat Clear Device pocket well healed; without hematoma or erythema.  There is no tethering  Regular rate and rhythm, no murmur Abd-soft with active BS No Clubbing cyanosis tr edema Skin-warm and dry A & Oriented  Grossly normal sensory and motor function  ECG sinus with P synchronous pacing with an upright QRS lead V1 and negative QRS lead I  ECG sinus with P synchronous pacing at 57 Q-wave lead V1 and RS in lead V1  ECG #1 demonstrated left bundle branch block. It turned out this was associated with sub threshold left ventricular pacing.   ECG #2 with of increased output on the LV lead demonstrated a negative QRS in lead 1 and a biphasic QRS in lead V1   ECG #3 to demonstrated negative QRS in lead 1 and upright QRS in lead V1  ECG 6/19 sinus P synchronous pacing at 65 Interval 16/18/47 Negative QRS lead I and upright QRS lead V1   Assessment and  Plan  NICM  Aborted cardiac Arrest  VT-polymorphic /syncope   CRT-D implant  Hypertension  CHF chronic-systolic/diastolic  Depression     No intercurrent ventricular arrhythmias  For his cardiomyopathy we will continue Aldactone, Lisabeth Register and carvedilol.  Blood pressure is well controlled.  Blood pressure also well controlled  Euvolemic and being followed by his ICM clinic we will  continue furosemide 20 mg a day     Device function is  normal.  Programming changes none  See Paceart for details

## 2021-12-27 NOTE — Telephone Encounter (Signed)
Refill refused. Already sent to pharmacy on 12/16/21

## 2021-12-27 NOTE — Patient Instructions (Signed)
Medication Instructions:  Your physician recommends that you continue on your current medications as directed. Please refer to the Current Medication list given to you today.  *If you need a refill on your cardiac medications before your next appointment, please call your pharmacy*   Lab Work: None ordered.  If you have labs (blood work) drawn today and your tests are completely normal, you will receive your results only by: MyChart Message (if you have MyChart) OR A paper copy in the mail If you have any lab test that is abnormal or we need to change your treatment, we will call you to review the results.   Testing/Procedures: None ordered.    Follow-Up: At [facility], you and your health needs are our priority.  As part of our continuing mission to provide you with exceptional heart care, we have created designated Provider Care Teams.  These Care Teams include your primary Cardiologist (physician) and Advanced Practice Providers (APPs -  Physician Assistants and Nurse Practitioners) who all work together to provide you with the care you need, when you need it.  We recommend signing up for the patient portal called "MyChart".  Sign up information is provided on this After Visit Summary.  MyChart is used to connect with patients for Virtual Visits (Telemedicine).  Patients are able to view lab/test results, encounter notes, upcoming appointments, etc.  Non-urgent messages can be sent to your provider as well.   To learn more about what you can do with MyChart, go to [email or web address].    Your next appointment:   12 months with Dr Klein  Important Information About Sugar       

## 2022-01-09 ENCOUNTER — Other Ambulatory Visit: Payer: Self-pay | Admitting: Internal Medicine

## 2022-01-23 ENCOUNTER — Ambulatory Visit (INDEPENDENT_AMBULATORY_CARE_PROVIDER_SITE_OTHER): Payer: Medicare PPO

## 2022-01-23 DIAGNOSIS — I5022 Chronic systolic (congestive) heart failure: Secondary | ICD-10-CM | POA: Diagnosis not present

## 2022-01-23 DIAGNOSIS — Z95810 Presence of automatic (implantable) cardiac defibrillator: Secondary | ICD-10-CM | POA: Diagnosis not present

## 2022-01-24 NOTE — Progress Notes (Signed)
EPIC Encounter for ICM Monitoring  Patient Name: Rickey Rice is a 81 y.o. male Date: 01/24/2022 Primary Care Physican: Lucianne Lei, MD Primary Cardiologist: [facility] Electrophysiologist: Vergie Living Pacing:  >99%       05/27/2021 Weight: 306 lbs 10/11/2021 Weight: 303-306 lbs 11/15/2021 Weight: 297 lbs 01/24/2022 Weight: 298 lbs                                           Spoke with patient and heart failure questions reviewed.  Transmission results reviewed.  Pt asymptomatic for fluid accumulation.  Reports feeling well at this time and voices no complaints.  He has started Ozempic.    Corvue Thoracic impedance suggesting normal fluid levels.    Prescribed:  Furosemide 20 mg take 1 tablet by mouth daily. Spironolactone 25 mg take 0.5 tablet (12.5 mg total) by mouth daily   Labs: 07/08/2019 Creatinine 1.49, BUN 21, Potassium 4.4, Sodium 141, GFR 44-51 05/27/2019 Creatinine 1.36, BUN 18, Potassium 4.5, Sodium 145, GFR 49-57 A complete set of results can be found in Results Review.   Recommendations:  No changes and encouraged to call if experiencing any fluid symptoms.   Follow-up plan: ICM clinic phone appointment on 02/27/2022.  91 day device clinic remote transmission 01/26/2022.       EP/Cardiology Office Visits:   06/15/2022 with Dr Debara Pickett.  Recall 12/22/2022 with Dr [name].     Copy of ICM check sent to Dr. [name].   3 month ICM trend: 01/23/2022.    12-14 Month ICM trend:     Rosalene Billings, RN 01/24/2022 6:56 AM

## 2022-01-31 ENCOUNTER — Telehealth: Payer: Self-pay | Admitting: Internal Medicine

## 2022-01-31 NOTE — Telephone Encounter (Signed)
Patient states he is returning a call received today regarding a transmission. I advised the patient that I do not see any documentation for today, but I see where a message was left on 11/02. Patient again confirmed that the call was received today and I called Device Clinic for advisement. CMA informed me that there is no current documentation on her end either and if the nurse called for anything in particular today, then patient's call will be returned. I relayed this to the patient--FYI.

## 2022-01-31 NOTE — Telephone Encounter (Signed)
I spoke with the scheduler to let her know that I did not see any phone note from the device clinic callling.

## 2022-02-27 ENCOUNTER — Ambulatory Visit (INDEPENDENT_AMBULATORY_CARE_PROVIDER_SITE_OTHER): Payer: Medicare PPO

## 2022-02-27 ENCOUNTER — Ambulatory Visit: Payer: Medicare PPO | Admitting: Internal Medicine

## 2022-02-27 DIAGNOSIS — Z95810 Presence of automatic (implantable) cardiac defibrillator: Secondary | ICD-10-CM | POA: Diagnosis not present

## 2022-02-27 DIAGNOSIS — I5022 Chronic systolic (congestive) heart failure: Secondary | ICD-10-CM

## 2022-03-01 NOTE — Progress Notes (Signed)
EPIC Encounter for ICM Monitoring  Patient Name: Rickey Rice is a 81 y.o. male Date: 03/01/2022 Primary Care Physican: Lucianne Lei, MD Primary Cardiologist: [name] Electrophysiologist: Vergie Living Pacing:  >99%       05/27/2021 Weight: 306 lbs 10/11/2021 Weight: 303-306 lbs 11/15/2021 Weight: 297 lbs 01/24/2022 Weight: 298 lbs 03/01/2022 Weight: 298 lbs                                           Spoke with patient and heart failure questions reviewed.  Transmission results reviewed.  Pt asymptomatic for fluid accumulation.  Reports feeling well at this time and voices no complaints.      Corvue Thoracic impedance suggesting normal fluid levels with exception of Thanksgiving holiday for about 13 days.    Prescribed:  Furosemide 20 mg take 1 tablet by mouth daily. Spironolactone 25 mg take 0.5 tablet (12.5 mg total) by mouth daily   Labs: 07/08/2019 Creatinine 1.49, BUN 21, Potassium 4.4, Sodium 141, GFR 44-51 05/27/2019 Creatinine 1.36, BUN 18, Potassium 4.5, Sodium 145, GFR 49-57 A complete set of results can be found in Results Review.   Recommendations:  No changes and encouraged to call if experiencing any fluid symptoms.   Follow-up plan: ICM clinic phone appointment on 04/10/2022.  91 day device clinic remote transmission 04/27/2022.       EP/Cardiology Office Visits:   06/15/2022 with Dr Debara Pickett.  Recall 12/22/2022 with Dr [name].     Copy of ICM check sent to Dr. [name].    3 month ICM trend: 02/27/2022.    12-14 Month ICM trend:     Rosalene Billings, RN 03/01/2022 12:47 PM

## 2022-03-08 DIAGNOSIS — E785 Hyperlipidemia, unspecified: Secondary | ICD-10-CM | POA: Diagnosis not present

## 2022-03-08 DIAGNOSIS — E1169 Type 2 diabetes mellitus with other specified complication: Secondary | ICD-10-CM | POA: Diagnosis not present

## 2022-03-08 DIAGNOSIS — F411 Generalized anxiety disorder: Secondary | ICD-10-CM | POA: Diagnosis not present

## 2022-03-08 DIAGNOSIS — I10 Essential (primary) hypertension: Secondary | ICD-10-CM | POA: Diagnosis not present

## 2022-03-08 DIAGNOSIS — E034 Atrophy of thyroid (acquired): Secondary | ICD-10-CM | POA: Diagnosis not present

## 2022-03-08 DIAGNOSIS — I429 Cardiomyopathy, unspecified: Secondary | ICD-10-CM | POA: Diagnosis not present

## 2022-04-10 ENCOUNTER — Ambulatory Visit (INDEPENDENT_AMBULATORY_CARE_PROVIDER_SITE_OTHER): Payer: Medicare PPO

## 2022-04-10 DIAGNOSIS — Z95810 Presence of automatic (implantable) cardiac defibrillator: Secondary | ICD-10-CM

## 2022-04-10 DIAGNOSIS — I5022 Chronic systolic (congestive) heart failure: Secondary | ICD-10-CM | POA: Diagnosis not present

## 2022-04-12 NOTE — Progress Notes (Signed)
EPIC Encounter for ICM Monitoring  Patient Name: Rickey Rice is a 82 y.o. male Date: 04/12/2022 Primary Care Physican: Lucianne Lei, MD Primary Cardiologist: [name] Electrophysiologist: Vergie Living Pacing:  >99%       05/27/2021 Weight: 306 lbs 10/11/2021 Weight: 303-306 lbs 11/15/2021 Weight: 297 lbs 01/24/2022 Weight: 298 lbs 03/01/2022 Weight: 298 lbs                                           Spoke with patient and heart failure questions reviewed.  Transmission results reviewed.  Pt asymptomatic for fluid accumulation.  Reports feeling well at this time and voices no complaints.      Corvue Thoracic impedance suggesting normal fluid levels with exception 12/22-1/6.    Prescribed:  Furosemide 20 mg take 1 tablet by mouth daily. Spironolactone 25 mg take 0.5 tablet (12.5 mg total) by mouth daily   Labs: 07/08/2019 Creatinine 1.49, BUN 21, Potassium 4.4, Sodium 141, GFR 44-51 05/27/2019 Creatinine 1.36, BUN 18, Potassium 4.5, Sodium 145, GFR 49-57 A complete set of results can be found in Results Review.   Recommendations:  No changes and encouraged to call if experiencing any fluid symptoms.   Follow-up plan: ICM clinic phone appointment on 05/16/2022.  91 day device clinic remote transmission 04/27/2022.       EP/Cardiology Office Visits:   06/15/2022 with Dr Debara Pickett.  Recall 12/22/2022 with Dr [name].     Copy of ICM check sent to Dr. [name].   3 month ICM trend: 03/31/2022.    12-14 Month ICM trend:     Rosalene Billings, RN 04/12/2022 3:07 PM

## 2022-04-27 ENCOUNTER — Ambulatory Visit (INDEPENDENT_AMBULATORY_CARE_PROVIDER_SITE_OTHER): Payer: Medicare PPO

## 2022-04-27 DIAGNOSIS — I428 Other cardiomyopathies: Secondary | ICD-10-CM

## 2022-04-27 LAB — CUP PACEART REMOTE DEVICE CHECK
Battery Remaining Longevity: 19 mo
Battery Remaining Percentage: 22 %
Battery Voltage: 2.78 V
Brady Statistic AP VP Percent: 1 %
Brady Statistic AP VS Percent: 1 %
Brady Statistic AS VP Percent: 99 %
Brady Statistic AS VS Percent: 1 %
Brady Statistic RA Percent Paced: 1 %
Date Time Interrogation Session: 20240201020017
HighPow Impedance: 54 Ohm
HighPow Impedance: 54 Ohm
Implantable Lead Connection Status: 753985
Implantable Lead Connection Status: 753985
Implantable Lead Connection Status: 753985
Implantable Lead Implant Date: 20160324
Implantable Lead Implant Date: 20160324
Implantable Lead Implant Date: 20160324
Implantable Lead Location: 753858
Implantable Lead Location: 753859
Implantable Lead Location: 753860
Implantable Lead Model: 7122
Implantable Pulse Generator Implant Date: 20160324
Lead Channel Impedance Value: 380 Ohm
Lead Channel Impedance Value: 480 Ohm
Lead Channel Impedance Value: 550 Ohm
Lead Channel Pacing Threshold Amplitude: 1 V
Lead Channel Pacing Threshold Amplitude: 1 V
Lead Channel Pacing Threshold Amplitude: 1.5 V
Lead Channel Pacing Threshold Pulse Width: 0.5 ms
Lead Channel Pacing Threshold Pulse Width: 0.5 ms
Lead Channel Pacing Threshold Pulse Width: 0.5 ms
Lead Channel Sensing Intrinsic Amplitude: 12 mV
Lead Channel Sensing Intrinsic Amplitude: 2.1 mV
Lead Channel Setting Pacing Amplitude: 2 V
Lead Channel Setting Pacing Amplitude: 2 V
Lead Channel Setting Pacing Amplitude: 2.5 V
Lead Channel Setting Pacing Pulse Width: 0.5 ms
Lead Channel Setting Pacing Pulse Width: 0.5 ms
Lead Channel Setting Sensing Sensitivity: 0.5 mV
Pulse Gen Serial Number: 7240350
Zone Setting Status: 755011

## 2022-05-03 DIAGNOSIS — H401211 Low-tension glaucoma, right eye, mild stage: Secondary | ICD-10-CM | POA: Diagnosis not present

## 2022-05-03 DIAGNOSIS — H401222 Low-tension glaucoma, left eye, moderate stage: Secondary | ICD-10-CM | POA: Diagnosis not present

## 2022-05-16 ENCOUNTER — Ambulatory Visit: Payer: Medicare PPO | Attending: Internal Medicine

## 2022-05-16 DIAGNOSIS — Z95810 Presence of automatic (implantable) cardiac defibrillator: Secondary | ICD-10-CM | POA: Diagnosis not present

## 2022-05-16 DIAGNOSIS — I5022 Chronic systolic (congestive) heart failure: Secondary | ICD-10-CM

## 2022-05-16 NOTE — Progress Notes (Unsigned)
EPIC Encounter for ICM Monitoring  Patient Name: Rickey Rice is a 82 y.o. male Date: 05/16/2022 Primary Care Physican: Lucianne Lei, MD Primary Cardiologist: [name] Electrophysiologist: Vergie Living Pacing:  >99%       05/27/2021 Weight: 306 lbs 10/11/2021 Weight: 303-306 lbs 11/15/2021 Weight: 297 lbs 01/24/2022 Weight: 298 lbs 03/01/2022 Weight: 298 lbs 05/16/2022 Weight: Not weighed recently                                         Spoke with patient and heart failure questions reviewed.  Transmission results reviewed.  Pt asymptomatic for fluid accumulation.   He is experiencing some family stress at this time and has been difficult to maintain low salt diet with all the stress he has been experiencing.    Corvue Thoracic impedance suggesting possible fluid accumulation starting 2/17.    Prescribed:  Furosemide 20 mg take 1 tablet by mouth daily. Spironolactone 25 mg take 0.5 tablet (12.5 mg total) by mouth daily   Labs: 07/08/2019 Creatinine 1.49, BUN 21, Potassium 4.4, Sodium 141, GFR 44-51 05/27/2019 Creatinine 1.36, BUN 18, Potassium 4.5, Sodium 145, GFR 49-57 A complete set of results can be found in Results Review.   Recommendations:  Recommendation to limit salt intake to 2000 mg daily and fluid intake to 64 oz daily.  Encouraged to call if experiencing any fluid symptoms.    Follow-up plan: ICM clinic phone appointment on 05/23/2022 to recheck fluid levels.  91 day device clinic remote transmission 07/27/2022.       EP/Cardiology Office Visits:   06/15/2022 with Dr Debara Pickett.  Recall 12/22/2022 with Dr [name].     Copy of ICM check sent to Dr. [name] and Dr Debara Pickett for review and recommendations if needed.   3 month ICM trend: 05/16/2022.    12-14 Month ICM trend:     Rosalene Billings, RN 05/16/2022 7:47 AM

## 2022-05-17 NOTE — Progress Notes (Signed)
Remote ICD transmission.   

## 2022-05-17 NOTE — Progress Notes (Signed)
Received: Leonides Grills, MD  Necha Harries Panda, RN No changes

## 2022-05-23 ENCOUNTER — Ambulatory Visit: Payer: Medicare PPO | Attending: Internal Medicine

## 2022-05-23 DIAGNOSIS — I10 Essential (primary) hypertension: Secondary | ICD-10-CM | POA: Diagnosis not present

## 2022-05-23 DIAGNOSIS — I5022 Chronic systolic (congestive) heart failure: Secondary | ICD-10-CM

## 2022-05-23 DIAGNOSIS — I429 Cardiomyopathy, unspecified: Secondary | ICD-10-CM | POA: Diagnosis not present

## 2022-05-23 DIAGNOSIS — Z95810 Presence of automatic (implantable) cardiac defibrillator: Secondary | ICD-10-CM

## 2022-05-23 DIAGNOSIS — F411 Generalized anxiety disorder: Secondary | ICD-10-CM | POA: Diagnosis not present

## 2022-05-23 DIAGNOSIS — E1169 Type 2 diabetes mellitus with other specified complication: Secondary | ICD-10-CM | POA: Diagnosis not present

## 2022-05-24 NOTE — Progress Notes (Signed)
EPIC Encounter for ICM Monitoring  Patient Name: Rickey Rice is a 82 y.o. male Date: 05/24/2022 Primary Care Physican: Lucianne Lei, MD Primary Cardiologist: [name] Electrophysiologist: Vergie Living Pacing:  >99%       05/27/2021 Weight: 306 lbs 10/11/2021 Weight: 303-306 lbs 11/15/2021 Weight: 297 lbs 01/24/2022 Weight: 298 lbs 03/01/2022 Weight: 298 lbs 05/16/2022 Weight: Not weighed recently                                         Spoke with patient and heart failure questions reviewed.  Transmission results reviewed.  Pt asymptomatic for fluid accumulation.      Corvue Thoracic impedance suggesting fluid levels returned to normal.    Prescribed:  Furosemide 20 mg take 1 tablet by mouth daily. Spironolactone 25 mg take 0.5 tablet (12.5 mg total) by mouth daily   Labs: 07/08/2019 Creatinine 1.49, BUN 21, Potassium 4.4, Sodium 141, GFR 44-51 05/27/2019 Creatinine 1.36, BUN 18, Potassium 4.5, Sodium 145, GFR 49-57 A complete set of results can be found in Results Review.   Recommendations:   Encouraged to call if experiencing any fluid symptoms.    Follow-up plan: ICM clinic phone appointment on 06/19/2022.  91 day device clinic remote transmission 07/27/2022.       EP/Cardiology Office Visits:   06/15/2022 with Dr Debara Pickett.  Recall 12/22/2022 with Dr [name].     Copy of ICM check sent to Dr. [name]   3 month ICM trend: 05/23/2022.    12-14 Month ICM trend:     Rosalene Billings, RN 05/24/2022 4:34 PM

## 2022-05-30 ENCOUNTER — Other Ambulatory Visit: Payer: Self-pay | Admitting: Internal Medicine

## 2022-06-15 ENCOUNTER — Encounter (HOSPITAL_BASED_OUTPATIENT_CLINIC_OR_DEPARTMENT_OTHER): Payer: Self-pay | Admitting: Internal Medicine

## 2022-06-15 ENCOUNTER — Ambulatory Visit (HOSPITAL_BASED_OUTPATIENT_CLINIC_OR_DEPARTMENT_OTHER): Payer: Medicare PPO | Admitting: Internal Medicine

## 2022-06-15 VITALS — BP 110/66 | HR 58 | Ht 71.5 in | Wt 307.5 lb

## 2022-06-15 DIAGNOSIS — I5022 Chronic systolic (congestive) heart failure: Secondary | ICD-10-CM | POA: Diagnosis not present

## 2022-06-15 DIAGNOSIS — E782 Mixed hyperlipidemia: Secondary | ICD-10-CM | POA: Diagnosis not present

## 2022-06-15 DIAGNOSIS — Z95810 Presence of automatic (implantable) cardiac defibrillator: Secondary | ICD-10-CM

## 2022-06-15 DIAGNOSIS — I10 Essential (primary) hypertension: Secondary | ICD-10-CM | POA: Diagnosis not present

## 2022-06-15 DIAGNOSIS — E119 Type 2 diabetes mellitus without complications: Secondary | ICD-10-CM | POA: Diagnosis not present

## 2022-06-15 NOTE — Patient Instructions (Signed)
Medication Instructions:  NO CHANGES  *If you need a refill on your cardiac medications before your next appointment, please call your pharmacy*    Follow-Up: At [facility], you and your health needs are our priority.  As part of our continuing mission to provide you with exceptional heart care, we have created designated Provider Care Teams.  These Care Teams include your primary Cardiologist (physician) and Advanced Practice Providers (APPs -  Physician Assistants and Nurse Practitioners) who all work together to provide you with the care you need, when you need it.  We recommend signing up for the patient portal called "MyChart".  Sign up information is provided on this After Visit Summary.  MyChart is used to connect with patients for Virtual Visits (Telemedicine).  Patients are able to view lab/test results, encounter notes, upcoming appointments, etc.  Non-urgent messages can be sent to your provider as well.   To learn more about what you can do with MyChart, go to [email or web address].    Your next appointment:    6 months with Dr. [name] 

## 2022-06-15 NOTE — Progress Notes (Signed)
OFFICE NOTE  Chief Complaint:  Follow-up heart failure  Primary Care Physician: Lucianne Lei, MD  HPI:  Rickey Rice  is a 82 year old gentleman with a history of diabetes, hypertension, hypothyroidism, and morbid obesity. In 2010 he had complaints of chest pain and underwent stress testing with [facility] which was a 2-day nuclear stress test and was apparently negative. Recently he underwent a colonoscopy and a preoperative EKG was abnormal. I unfortunately do not have that EKG to review, but I did review the EKG from your office on April 09, 2012 which showed a borderline intraventricular conduction delay, a sinus rhythm at 66, and poor R-wave progression. Today in the office he has an abnormal EKG demonstrating a left bundle branch block with a QRS duration of 168 msec, heart rate of 78 in sinus. His only symptoms are shortness of breath with exertion. He underwent evaluation of his left bundle branch block in February 2014. This is a 2 day nuclear stress test which was negative for ischemia. There was a small inferior and apical defect which could represent scar versus artifact. EF was mildly reduced at 49%.    Rickey Rice returns today for followup. He underwent knee replacement surgery as we had authorized him to do. He is tolerated surgery without any complications. He reports that he is recovering fairly well and is more mobile than he had been in the past. He started to do some exercises at the [facility] and also this will translate into weight loss. Blood pressure has been stable he denies any chest pain.  He had blood work in January from his primary care provider which showed a total cholesterol of 128, HDL 51, triglycerides 53 and LDL 65. His hemoglobin A1c was 6.2.  Rickey Rice is now contemplating lethargy. Unfortunately has not been able to walk enough to lose weight in fact has gained some weight. 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 He feels like it's time to retire work on his generalized health. I would tend to agree with this.  I saw Rickey Rice back in the office today. He recently followed up from the hospital after having a sudden cardiac arrest. Unfortunately he was revived and is now status post AICD. He was to have CRT-D therapy, however there was difficulty with the LV lead, therefore he only has a single ventricle pacing with defibrillator functions. He is tolerating this well and had a small episode with fever postoperatively, but this was thought to be due to possibly a UTI. There is no evidence of pacemaker site fluctuance or swelling. Overall he feels he is doing well. He started exercising is managed to lose some weight. He is less short of breath and is more active.  Rickey Rice returns today for follow-up. Overall he is doing exceedingly well. He's managed to lose about 10 pounds. He is now below 300 pounds. His shortness of breath is improved some and he is ambulating better on his knees. He's had no device problems including no firings. He denies any chest pain or worsening shortness of breath and has not had any presyncopal or syncopal episodes.  Rickey Rice returns today for follow-up. Unfortunately he's gained some of his weight back. He denies a chest pain or worsening shortness of breath. He does not have any swelling. His last EF was as mentioned 40% in March 2016. EKG shows left bundle branch block. He scheduled to see Dr. [name] back for  device interrogation next month.  11/12/2015  Rickey Rice was seen today in follow-up. Unfortunately he was recently seen for some weight gain and fatigue by Rosaria Ferries, PA-C. She felt that he might be getting some extra fluid and recommended a short trial of increased Lasix. Weight is actually been fairly stable now at 300-301. Around that time he was found to have a UTI was treated for that and his symptoms improved. He was also noted to have  problems with infection and underwent multiple rounds of antibiotics. This led to problems with diarrhea or digestive problems and ultimately he went on probiotics. Recently, though he was talking on the phone and had an episode of transient global amnesia shortly after his wife left the house. He called her back and she came home and noted that he may have had an episode with his defibrillator. This was subsequently confirmed to be VT and he underwent a shock for that. He is seen Dr. [name] who increased his carvedilol to 12-1/2 mg twice a day. Blood pressure is actually a little low today but he reports being asymptomatic with that.  02/15/2016  Rickey Rice returns today for follow-up. He reports feeling well and denies worsening shortness of breath or chest pain. HE has not had any presyncopal spells. He just saw EP who noted his defibrillator was working properly. We again discussed strategies for heart failure management, including possibly switching lisinopril over to [facility]. He did not seem interested in this. It is noted his bp is higher today and that may at least allow uptitration of his lisinopril.  05/29/2016  Rickey Rice was seen today in follow-up. Weight is down slightly although has not made a significant improvement. He needs to start to do more exercise but he has blamed the weather and his arthritis. Blood pressure appears to be well-controlled and he brought readings in the office today. Although I do feel that there still potentially some room to consider adding Entresto. We discussed the medication today and it would effectively replace his lisinopril. He understands he would need to wash off the lisinopril for 36-48 hours before starting Entresto. We can provide him with samples and help him secure preauthorization.  12/28/2017  Rickey Rice is seen today in follow-up heart failure.  He reports NYHA class II symptoms, noting some shortness of breath with moderate exertion.  He has  recently had some weight loss.  He seems to be tolerating Entresto 24/26.  According to Dr. Olin Pia notes his LV lead is now activated.  He does appear to be biventricular pacing today.  Labs from July showed total cholesterol 143, HDL 52, LDL 79 triglycerides 42, hemoglobin A1c 5.8.  BNP in November was 155.  Recent remote check showed no change in thoracic impedance suggesting that he is compensated with his heart failure.  We previously discussed increasing the dose of Entresto as blood pressure would tolerate.  He is interested in getting a flu shot today.  11/13/2018  Rickey Rice is seen today back for follow-up of heart failure.  He continues to have NYHA class II symptoms.  He is struggling with weight.  Weight recently has gone up about 6 pounds.  He has been on the moderate dose of Entresto.  Unfortunately repeat echo earlier this year showed no significant improvement in LV function with his EF being 20 to 25%.  This is despite activation of his LV lead and biventricular pacing.  Remote check show stable thoracic impedance and no evidence of  volume overload.  Finally, he has been diagnosed with obstructive sleep apnea and will begin treatment shortly.  Hopefully this will be additionally helpful.  02/13/2019  Rickey Rice returns today for follow-up of his heart failure.  Overall he continues to have NYHA class II symptoms.  He is been able to do most activities without incident.  LVEF remains depressed.  There is been no evidence of volume overload recently on his biventricular pacemaker.  Remote checks are undergoing.  Weight is down a few pounds.  He is try to make some dietary changes.  I added Aldactone and he is now on highest dose Entresto.  Plan is to repeat an echo likely in a few months to see if there is been any interval improvement in LV function.  Also, he was diagnosed with obstructive sleep apnea but is not yet been fitted with a mask.  He said when he was treated with that during  the titration study, the next day he felt the best he is ever felt was able to do many activities and generally had a good energy level.  Hopefully based on his insurance plan he will be able to get equipment beginning in January.  01/07/2020  Rickey Rice seen today in follow-up of his heart failure.  He underwent an echo in February 2021 which does show a small improvement in LVEF up to 35 to 40%.  He continues to have remote defibrillator checks which have been fairly normal.  He denies any shocks.  He reports fairly stable fluid status although weight has gone up to about 312 pounds today from around 300 pounds in the past.  He reports less physical activity and needs to make more appropriate dietary changes.  Despite this blood pressure is well controlled today 116/70.  EKG shows a biventricular paced rhythm.  Lipid profile shows total cholesterol 124, HDL 46, LDL 65 and triglycerides of 47.  Hemoglobin A1c of 5.6.  05/18/2021  Rickey Rice returns today for follow-up.  In January he told me that he had a call from Dr. [name] that his device going off.  I do not see any evidence of this although he was noted to have mode switching and was noted to be in A-fib.  Dr. [name] noted that he had SCAF (subclinical device detected afib) -I assured him that if he had had a shock that he would have been aware of it.  He is overdue for an echocardiogram last was in February 2021 which showed EF up to 35 to 40%.  He has had weight gain but no worsening edema.  His wife had COVID-19 a few weeks ago but was treated for that.  He tested negative.  Recent labs 5 days ago showed total cholesterol 142, HDL 52, LDL 77 and triglycerides 57, A1c 6.4% creatinine 1.59.  11/29/2021  Rickey Rice is seen today for follow-up of heart failure.  Overall he says he is doing well.  Fairly recently had an increase in edema.  He had apparently cut back his furosemide to 10 mg daily.  He said that he was prescribed that although I never  typically will prescribe this low of a dose.  I had thought he was on 20 mg daily.  His PCP added 40 mg to the 10 mg daily dose for about a week at which time he had significant diuresis and improvement in lower extremity edema.  Subsequent to that he had a pacemaker check which showed hypovolemia.  He has subsequently  gone back to the 10 mg dose, he believes but he will go home and check that dose.  He denies any chest pain.  He has had no further ICD firing.  He has a in person defibrillator check with Dr. [name] in October.  EKG today shows a biventricular paced rhythm at 62.  06/15/2022  Rickey Rice returns today for follow-up.  Overall he says he is doing pretty well.  He has had remote checks and saw Dr. [name] for his defibrillator back in October.  He was felt to be euvolemic.  This spring in February he had an increase in his OptiVol's but they did return back to normal.  He has used increased Lasix at times but recently has had increased fluid intake.  Overall he denies any chest pain and has had no syncopal episodes.  He will get short of breath with marked exertion.  He is still doing some photography.  Blood pressure is well-controlled today.  He was trialed on Ozempic for a while for weight loss but had some issues with it and stopped.  He intends to get back to doing some exercise.  PMHx:  Past Medical History:  Diagnosis Date   AICD (automatic cardioverter/defibrillator) present    Arthritis    oa   Cardiac arrest ([place]) 06/07/2014   Primary VF arrest with successful resuscitation and s/p ICD implant   Diabetes mellitus without complication ([place])    type 2   Dyslipidemia    History of nuclear stress test 04/2012   lexiscan - 2 day protocol; low risk study, evidence of inferrior & apical scar but no ischemia    Hypertension    Hypothyroidism    LBBB (left bundle branch block)    Morbid obesity (HCC)    NICM (nonischemic cardiomyopathy) ([place])    Prostate enlargement    pt denies     Past Surgical History:  Procedure Laterality Date   BI-VENTRICULAR IMPLANTABLE CARDIOVERTER DEFIBRILLATOR N/A 06/18/2014   STJ CRTD implanted by Dr [name]   COLONOSCOPY W/ BIOPSIES     COLONOSCOPY WITH PROPOFOL N/A 04/05/2017   Procedure: COLONOSCOPY WITH PROPOFOL;  Surgeon: Juanita Craver, MD;  Location: [facility];  Service: Endoscopy;  Laterality: N/A;   FINGER SURGERY  1954   1 st joint  right hand amputated   KNEE ARTHROSCOPY Left    LEFT HEART CATHETERIZATION WITH CORONARY ANGIOGRAM N/A 06/12/2014   Procedure: LEFT HEART CATHETERIZATION WITH CORONARY ANGIOGRAM;  Surgeon: Leonie Man, MD;  Location: [facility] C[facility] LAB;  Service: Cardiovascular;  Laterality: N/A;   [place]   TOTAL KNEE ARTHROPLASTY Right 02/05/2013   Procedure: TOTAL KNEE ARTHROPLASTY;  Surgeon: Kerin Salen, MD;  Location: [place];  Service: Orthopedics;  Laterality: Right;   TOTAL KNEE ARTHROPLASTY Left 01/19/2014   Procedure: LEFT TOTAL KNEE ARTHROPLASTY;  Surgeon: Kerin Salen, MD;  Location: [place];  Service: Orthopedics;  Laterality: Left;    FAMHx:  Family History  Problem Relation Age of Onset   Cancer Mother    Kidney disease Brother    Hypertension Sister     SOCHx:   reports that he has never smoked. He has never used smokeless tobacco. He reports current alcohol use. He reports that he does not use drugs.  ALLERGIES:  Allergies  Allergen Reactions   Bee Venom Swelling    Swelling on lips and tongue   Demerol [Meperidine]     Not sure   Pioglitazone  Lips swelling= actos   Shellfish Allergy     rash Other reaction(s): Unknown   Sulfa Antibiotics     Not sure   Shrimp Extract Itching    Rash    ROS: Pertinent items noted in HPI and remainder of comprehensive ROS otherwise negative.  HOME MEDS: Current Outpatient Medications  Medication Sig Dispense Refill   aspirin EC 81 MG tablet Take 81 mg by mouth daily.     brimonidine (ALPHAGAN) 0.2 % ophthalmic solution Place 1  drop into the left eye 3 (three) times daily.     carboxymethylcellulose (REFRESH PLUS) 0.5 % SOLN Place 1 drop into both eyes daily.     carvedilol (COREG) 12.5 MG tablet TAKE 1 TABLET BY MOUTH TWICE DAILY WITH A MEAL 180 tablet 3   dapagliflozin propanediol (FARXIGA) 10 MG TABS tablet Take 10 mg by mouth daily.     ENTRESTO 97-103 MG TAKE 1 TABLET BY MOUTH TWICE DAILY 60 tablet 9   furosemide (LASIX) 20 MG tablet Take 1 tablet (20 mg total) by mouth daily. 30 tablet 6   glucose blood test strip USE AS DIRECTED TO CHECK BLOOD SUGAR BID     JANUVIA 100 MG tablet Take 100 mg by mouth daily.     levothyroxine (SYNTHROID, LEVOTHROID) 75 MCG tablet Take 75 mcg by mouth every evening. Monday through Saturday     Multiple Vitamins-Minerals (ICAPS AREDS 2) CAPS Take 1 capsule by mouth 2 (two) times daily.     Omega-3 Fatty Acids (OMEGA 3 PO) Take 1,600 mg by mouth in the morning and at bedtime.     rosuvastatin (CRESTOR) 20 MG tablet Take 20 mg by mouth daily.     spironolactone (ALDACTONE) 25 MG tablet TAKE 1/2 TABLET(12.5 MG) BY MOUTH DAILY 45 tablet 3   No current facility-administered medications for this visit.    LABS/IMAGING: No results found for this or any previous visit (from the past 48 hour(s)). No results found.  VITALS: BP 110/66 (BP Location: Left Arm, Patient Position: Sitting, Cuff Size: Large)   Pulse (!) 58   Ht 5' 11.5" (1.816 m)   Wt (!) 307 lb 8 oz (139.5 kg)   BMI 42.29 kg/m   EXAM: General appearance: alert, no distress and morbidly obese Neck: no carotid bruit, no JVD and thyroid not enlarged, symmetric, no tenderness/mass/nodules Lungs: clear to auscultation bilaterally and AICD in left upper chest Heart: regular rate and rhythm, S1, S2 normal, no murmur, click, rub or gallop Abdomen: soft, non-tender; bowel sounds normal; no masses,  no organomegaly and Morbidly obese Extremities: extremities normal, atraumatic, no cyanosis or edema Pulses: 2+ and  symmetric Skin: Skin color, texture, turgor normal. No rashes or lesions  EKG: Biventricular pacing at 58-personally reviewed  ASSESSMENT: Status post aborted sudden cardiac death-status post placement of a [employer] Bi-V AICD (LV lead is disabled due to dysfunction) Recent VT/VF with appropriate AICD shock Negative nuclear stress test for ischemia, fixed inferoapical defect suggesting possible scar versus artifact, EF 49% Diabetes type 2 Hypertension-well controlled Dyslipidemia on statin - well controlled Non-ischemic cardiomyopathy-EF 20-25%, improved to 35 to 40% (04/2019), possibly improved up to 40 to 45% by echo (2023) 8.   OSA - not currently on therapy  PLAN: 1.   Rickey Rice seems to be doing well.  His weight has been generally stable.  He had a recent increase in OptiVol's but that improved.  He does not appear overtly volume overloaded today.  He understands he can  use sliding scale Lasix as needed.  Blood pressure is well-controlled.  A1c was 6.2% in December.  His lipids at that time showed total cholesterol 135, HDL 50, triglycerides 65 and LDL 71.  No changes to his medicines today.  Follow-up with me in 6 months.  Pixie Casino, MD, [facility], [place] Director of the Advanced Lipid Disorders &  Cardiovascular Risk Reduction Clinic Diplomate of the American Board of Clinical Lipidology Attending Cardiologist  Direct Dial: [phone]  Fax: [phone]  Website:  [email or web address] 06/15/2022, 1:36 PM

## 2022-06-19 ENCOUNTER — Ambulatory Visit: Payer: Medicare PPO | Attending: Internal Medicine

## 2022-06-19 DIAGNOSIS — Z95810 Presence of automatic (implantable) cardiac defibrillator: Secondary | ICD-10-CM

## 2022-06-19 DIAGNOSIS — I5022 Chronic systolic (congestive) heart failure: Secondary | ICD-10-CM

## 2022-06-21 NOTE — Progress Notes (Signed)
EPIC Encounter for ICM Monitoring  Patient Name: Rickey Rice is a 82 y.o. male Date: 06/21/2022 Primary Care Physican: Lucianne Lei, MD Primary Cardiologist: [facility] Electrophysiologist: Vergie Living Pacing:  >99%       03/01/2022 Weight: 298 lbs 05/16/2022 Weight: Not weighed recently 06/15/2022 Office Weight: 307 lbs                                         Spoke with patient and heart failure questions reviewed.  Transmission results reviewed.  Pt asymptomatic for fluid accumulation.      Corvue Thoracic impedance suggesting normal fluid levels with the exception of possible fluid accumulation from 3/13-3/23.   Prescribed:  Furosemide 20 mg take 1 tablet by mouth daily. Spironolactone 25 mg take 0.5 tablet (12.5 mg total) by mouth daily   Labs: 07/08/2019 Creatinine 1.49, BUN 21, Potassium 4.4, Sodium 141, GFR 44-51 05/27/2019 Creatinine 1.36, BUN 18, Potassium 4.5, Sodium 145, GFR 49-57 A complete set of results can be found in Results Review.   Recommendations:   Encouraged to call if experiencing any fluid symptoms.    Follow-up plan: ICM clinic phone appointment on 07/24/2022.  91 day device clinic remote transmission 07/27/2022.       EP/Cardiology Office Visits:   12/05/2022 with Dr Debara Pickett.  Recall 12/22/2022 with Dr [name].     Copy of ICM check sent to Dr. [name].   3 month ICM trend: 06/19/2022.    12-14 Month ICM trend:     Rosalene Billings, RN 06/21/2022 2:36 PM

## 2022-07-21 ENCOUNTER — Telehealth: Payer: Self-pay | Admitting: Internal Medicine

## 2022-07-21 MED ORDER — SPIRONOLACTONE 25 MG PO TABS: ORAL_TABLET | ORAL | 3 refills | Status: DC

## 2022-07-21 NOTE — Telephone Encounter (Signed)
Patient of Dr. [name]. Please review for refill. Thank you!  

## 2022-07-21 NOTE — Telephone Encounter (Signed)
Medication refilled and sent to desired pharmacy. Patient verbalized understanding. 

## 2022-07-21 NOTE — Telephone Encounter (Signed)
*  STAT* If patient is at the pharmacy, call can be transferred to refill team.   1. Which medications need to be refilled? (please list name of each medication and dose if known) spironolactone (ALDACTONE) 25 MG tablet   2. Which pharmacy/location (including street and city if local pharmacy) is medication to be sent to?  [id number] - [place], [place] - [address]    3. Do they need a 30 day or 90 day supply? 90 day

## 2022-07-24 ENCOUNTER — Ambulatory Visit: Payer: Medicare PPO | Attending: Internal Medicine

## 2022-07-24 DIAGNOSIS — Z95810 Presence of automatic (implantable) cardiac defibrillator: Secondary | ICD-10-CM

## 2022-07-24 DIAGNOSIS — I5022 Chronic systolic (congestive) heart failure: Secondary | ICD-10-CM

## 2022-07-26 NOTE — Progress Notes (Signed)
EPIC Encounter for ICM Monitoring  Patient Name: Rickey Rice is a 82 y.o. male Date: 07/26/2022 Primary Care Physican: Renaye Rakers, MD Primary Cardiologist: [name] Electrophysiologist: Joycelyn Schmid Pacing:  >99%       06/15/2022 Office Weight: 307 lbs  Battery Longevity: 1.3 years                                         Spoke with patient and heart failure questions reviewed.  Transmission results reviewed.  Pt asymptomatic for fluid accumulation.  Reports feeling well at this time and voices no complaints.   He reports less fluid intake during increased impedance.      Corvue Thoracic impedance suggesting normal fluid levels with the exception of possible dryness from 4/14-4/25.     Prescribed:  Furosemide 20 mg take 1 tablet by mouth daily. Spironolactone 25 mg take 0.5 tablet (12.5 mg total) by mouth daily   Labs: 07/08/2019 Creatinine 1.49, BUN 21, Potassium 4.4, Sodium 141, GFR 44-51 05/27/2019 Creatinine 1.36, BUN 18, Potassium 4.5, Sodium 145, GFR 49-57 A complete set of results can be found in Results Review.   Recommendations: No changes and encouraged to call if experiencing any fluid symptoms.   Follow-up plan: ICM clinic phone appointment on 08/28/2022.  91 day device clinic remote transmission 07/27/2022.       EP/Cardiology Office Visits:   12/05/2022 with Dr Rennis Golden.  Recall 12/22/2022 with Dr Graciela Husbands.     Copy of ICM check sent to Dr. Graciela Husbands.    3 month ICM trend: 07/24/2022.    12-14 Month ICM trend:     [name], RN 07/26/2022 7:15 AM

## 2022-07-27 ENCOUNTER — Ambulatory Visit (INDEPENDENT_AMBULATORY_CARE_PROVIDER_SITE_OTHER): Payer: Medicare PPO

## 2022-07-27 DIAGNOSIS — I428 Other cardiomyopathies: Secondary | ICD-10-CM

## 2022-07-31 ENCOUNTER — Telehealth: Payer: Self-pay

## 2022-07-31 NOTE — Telephone Encounter (Signed)
Received call from patient stating he was advised to send 3 month manual remote transmission but forgot how to do that. Assisted with sending report and he appreciated the help.

## 2022-08-01 LAB — CUP PACEART REMOTE DEVICE CHECK
Battery Remaining Longevity: 13 mo
Battery Remaining Percentage: 14 %
Battery Voltage: 2.72 V
Brady Statistic AP VP Percent: 1 %
Brady Statistic AP VS Percent: 1 %
Brady Statistic AS VP Percent: 99 %
Brady Statistic AS VS Percent: 1 %
Brady Statistic RA Percent Paced: 1 %
Date Time Interrogation Session: 20240506144632
HighPow Impedance: 62 Ohm
HighPow Impedance: 62 Ohm
Implantable Lead Connection Status: 753985
Implantable Lead Connection Status: 753985
Implantable Lead Connection Status: 753985
Implantable Lead Implant Date: 20160324
Implantable Lead Implant Date: 20160324
Implantable Lead Implant Date: 20160324
Implantable Lead Location: 753858
Implantable Lead Location: 753859
Implantable Lead Location: 753860
Implantable Lead Model: 7122
Implantable Pulse Generator Implant Date: 20160324
Lead Channel Impedance Value: 360 Ohm
Lead Channel Impedance Value: 450 Ohm
Lead Channel Impedance Value: 530 Ohm
Lead Channel Pacing Threshold Amplitude: 1 V
Lead Channel Pacing Threshold Amplitude: 1 V
Lead Channel Pacing Threshold Amplitude: 1.5 V
Lead Channel Pacing Threshold Pulse Width: 0.5 ms
Lead Channel Pacing Threshold Pulse Width: 0.5 ms
Lead Channel Pacing Threshold Pulse Width: 0.5 ms
Lead Channel Sensing Intrinsic Amplitude: 12 mV
Lead Channel Sensing Intrinsic Amplitude: 2.1 mV
Lead Channel Setting Pacing Amplitude: 2 V
Lead Channel Setting Pacing Amplitude: 2 V
Lead Channel Setting Pacing Amplitude: 2.5 V
Lead Channel Setting Pacing Pulse Width: 0.5 ms
Lead Channel Setting Pacing Pulse Width: 0.5 ms
Lead Channel Setting Sensing Sensitivity: 0.5 mV
Pulse Gen Serial Number: 7240350
Zone Setting Status: 755011

## 2022-08-11 ENCOUNTER — Other Ambulatory Visit: Payer: Self-pay | Admitting: Internal Medicine

## 2022-08-16 NOTE — Progress Notes (Signed)
Remote ICD transmission.   

## 2022-08-28 ENCOUNTER — Ambulatory Visit: Payer: Medicare PPO | Attending: Internal Medicine

## 2022-08-28 DIAGNOSIS — I5022 Chronic systolic (congestive) heart failure: Secondary | ICD-10-CM

## 2022-08-28 DIAGNOSIS — Z95810 Presence of automatic (implantable) cardiac defibrillator: Secondary | ICD-10-CM

## 2022-08-30 NOTE — Progress Notes (Signed)
EPIC Encounter for ICM Monitoring  Patient Name: Rickey Rice is a 82 y.o. male Date: 08/30/2022 Primary Care Physican: Renaye Rakers, MD Primary Cardiologist: [name] Electrophysiologist: Joycelyn Schmid Pacing:  >99%       06/15/2022 Office Weight: 307 lbs   Battery Longevity: 11.9 months                                         Attempted call to patient and unable to reach.  He was in a meeting and will return call. Transmission reviewed.      Corvue Thoracic impedance suggesting normal fluid levels with the exception intermittent days of dryness.     Prescribed:  Furosemide 20 mg take 1 tablet by mouth daily. Spironolactone 25 mg take 0.5 tablet (12.5 mg total) by mouth daily   Labs: 07/08/2019 Creatinine 1.49, BUN 21, Potassium 4.4, Sodium 141, GFR 44-51 05/27/2019 Creatinine 1.36, BUN 18, Potassium 4.5, Sodium 145, GFR 49-57 A complete set of results can be found in Results Review.   Recommendations:  Unable to reach.     Follow-up plan: ICM clinic phone appointment on 10/02/2022.  91 day device clinic remote transmission 10/26/2022.       EP/Cardiology Office Visits:   12/05/2022 with Dr Rennis Golden.  Recall 12/22/2022 with Dr Graciela Husbands.     Copy of ICM check sent to Dr. Graciela Husbands.  3 month ICM trend: 08/28/2022.    12-14 Month ICM trend:     [name], RN 08/30/2022 1:13 PM

## 2022-08-31 NOTE — Progress Notes (Signed)
Spoke with patient and heart failure questions reviewed.  Transmission results reviewed.  Pt asymptomatic for fluid accumulation.  Reports feeling well at this time and voices no complaints.  Has not weighed lately.  No changes and encouraged to call if experiencing any fluid symptoms.

## 2022-09-06 ENCOUNTER — Other Ambulatory Visit: Payer: Self-pay | Admitting: Internal Medicine

## 2022-09-25 ENCOUNTER — Telehealth: Payer: Self-pay

## 2022-09-25 NOTE — Telephone Encounter (Signed)
Pt called to report suspicious number received via text this morning about an appointment.  Advised he does not have any scheduled appointments this week and has ICM remote transmission scheduled for 7/8.  He thought it could be scam.  Advised there are no notes of anyone from [facility] trying to reach him.  He appreciated the asssitance.

## 2022-09-26 DIAGNOSIS — R413 Other amnesia: Secondary | ICD-10-CM | POA: Diagnosis not present

## 2022-09-26 DIAGNOSIS — Z6841 Body Mass Index (BMI) 40.0 and over, adult: Secondary | ICD-10-CM | POA: Diagnosis not present

## 2022-09-26 DIAGNOSIS — E1169 Type 2 diabetes mellitus with other specified complication: Secondary | ICD-10-CM | POA: Diagnosis not present

## 2022-09-26 DIAGNOSIS — E7800 Pure hypercholesterolemia, unspecified: Secondary | ICD-10-CM | POA: Diagnosis not present

## 2022-09-26 DIAGNOSIS — H409 Unspecified glaucoma: Secondary | ICD-10-CM | POA: Diagnosis not present

## 2022-09-26 DIAGNOSIS — I10 Essential (primary) hypertension: Secondary | ICD-10-CM | POA: Diagnosis not present

## 2022-09-26 DIAGNOSIS — E785 Hyperlipidemia, unspecified: Secondary | ICD-10-CM | POA: Diagnosis not present

## 2022-09-26 DIAGNOSIS — E039 Hypothyroidism, unspecified: Secondary | ICD-10-CM | POA: Diagnosis not present

## 2022-09-26 DIAGNOSIS — I252 Old myocardial infarction: Secondary | ICD-10-CM | POA: Diagnosis not present

## 2022-09-26 DIAGNOSIS — I2510 Atherosclerotic heart disease of native coronary artery without angina pectoris: Secondary | ICD-10-CM | POA: Diagnosis not present

## 2022-09-26 DIAGNOSIS — I509 Heart failure, unspecified: Secondary | ICD-10-CM | POA: Diagnosis not present

## 2022-09-26 DIAGNOSIS — E034 Atrophy of thyroid (acquired): Secondary | ICD-10-CM | POA: Diagnosis not present

## 2022-09-26 DIAGNOSIS — E662 Morbid (severe) obesity with alveolar hypoventilation: Secondary | ICD-10-CM | POA: Diagnosis not present

## 2022-09-26 DIAGNOSIS — G4733 Obstructive sleep apnea (adult) (pediatric): Secondary | ICD-10-CM | POA: Diagnosis not present

## 2022-09-26 DIAGNOSIS — N1831 Chronic kidney disease, stage 3a: Secondary | ICD-10-CM | POA: Diagnosis not present

## 2022-09-26 DIAGNOSIS — R269 Unspecified abnormalities of gait and mobility: Secondary | ICD-10-CM | POA: Diagnosis not present

## 2022-10-02 ENCOUNTER — Ambulatory Visit: Payer: Medicare PPO | Attending: Internal Medicine

## 2022-10-02 DIAGNOSIS — Z95810 Presence of automatic (implantable) cardiac defibrillator: Secondary | ICD-10-CM | POA: Diagnosis not present

## 2022-10-02 DIAGNOSIS — I5022 Chronic systolic (congestive) heart failure: Secondary | ICD-10-CM

## 2022-10-06 NOTE — Progress Notes (Signed)
EPIC Encounter for ICM Monitoring  Patient Name: Rickey Rice is a 82 y.o. male Date: 10/06/2022 Primary Care Physican: Renaye Rakers, MD Primary Cardiologist: [name] Electrophysiologist: Joycelyn Schmid Pacing:  >99%       06/15/2022 Office Weight: 307 lbs   Battery Longevity: 9.5 months                                         Spoke with patient and heart failure questions reviewed.  Transmission results reviewed.  Pt asymptomatic for fluid accumulation.  Reports feeling well at this time and voices no complaints.     Corvue Thoracic impedance suggesting normal fluid levels within the last month.     Prescribed:  Furosemide 20 mg take 1 tablet by mouth daily. Spironolactone 25 mg take 0.5 tablet (12.5 mg total) by mouth daily   Labs: 07/08/2019 Creatinine 1.49, BUN 21, Potassium 4.4, Sodium 141, GFR 44-51 05/27/2019 Creatinine 1.36, BUN 18, Potassium 4.5, Sodium 145, GFR 49-57 A complete set of results can be found in Results Review.   Recommendations:  No changes and encouraged to call if experiencing any fluid symptoms.   Follow-up plan: ICM clinic phone appointment on 11/06/2022.  91 day device clinic remote transmission 10/26/2022.       EP/Cardiology Office Visits:   12/05/2022 with Dr Rennis Golden.  Recall 12/22/2022 with Dr Graciela Husbands.     Copy of ICM check sent to Dr. Graciela Husbands.  3 month ICM trend: 10/02/2022.    12-14 Month ICM trend:     [name], RN 10/06/2022 11:41 AM

## 2022-10-26 ENCOUNTER — Ambulatory Visit: Payer: Medicare PPO

## 2022-10-26 DIAGNOSIS — I428 Other cardiomyopathies: Secondary | ICD-10-CM | POA: Diagnosis not present

## 2022-10-26 LAB — CUP PACEART REMOTE DEVICE CHECK
Battery Remaining Longevity: 9 mo
Battery Remaining Percentage: 10 %
Battery Voltage: 2.68 V
Brady Statistic AP VP Percent: 1 %
Brady Statistic AP VS Percent: 1 %
Brady Statistic AS VP Percent: 99 %
Brady Statistic AS VS Percent: 1 %
Brady Statistic RA Percent Paced: 1 %
Brady Statistic RV Percent Paced: 99 %
Date Time Interrogation Session: 20240801020014
HighPow Impedance: 63 Ohm
HighPow Impedance: 63 Ohm
Implantable Lead Connection Status: 753985
Implantable Lead Connection Status: 753985
Implantable Lead Connection Status: 753985
Implantable Lead Implant Date: 20160324
Implantable Lead Implant Date: 20160324
Implantable Lead Implant Date: 20160324
Implantable Lead Location: 753858
Implantable Lead Location: 753859
Implantable Lead Location: 753860
Implantable Lead Model: 7122
Implantable Pulse Generator Implant Date: 20160324
Lead Channel Impedance Value: 390 Ohm
Lead Channel Impedance Value: 490 Ohm
Lead Channel Impedance Value: 550 Ohm
Lead Channel Pacing Threshold Amplitude: 1 V
Lead Channel Pacing Threshold Amplitude: 1 V
Lead Channel Pacing Threshold Amplitude: 1.5 V
Lead Channel Pacing Threshold Pulse Width: 0.5 ms
Lead Channel Pacing Threshold Pulse Width: 0.5 ms
Lead Channel Pacing Threshold Pulse Width: 0.5 ms
Lead Channel Sensing Intrinsic Amplitude: 12 mV
Lead Channel Sensing Intrinsic Amplitude: 2.2 mV
Lead Channel Setting Pacing Amplitude: 2 V
Lead Channel Setting Pacing Amplitude: 2 V
Lead Channel Setting Pacing Amplitude: 2.5 V
Lead Channel Setting Pacing Pulse Width: 0.5 ms
Lead Channel Setting Pacing Pulse Width: 0.5 ms
Lead Channel Setting Sensing Sensitivity: 0.5 mV
Pulse Gen Serial Number: 7240350
Zone Setting Status: 755011

## 2022-10-31 DIAGNOSIS — G4733 Obstructive sleep apnea (adult) (pediatric): Secondary | ICD-10-CM | POA: Diagnosis not present

## 2022-11-06 ENCOUNTER — Ambulatory Visit: Payer: Medicare PPO

## 2022-11-06 DIAGNOSIS — Z95810 Presence of automatic (implantable) cardiac defibrillator: Secondary | ICD-10-CM

## 2022-11-06 DIAGNOSIS — I5022 Chronic systolic (congestive) heart failure: Secondary | ICD-10-CM | POA: Diagnosis not present

## 2022-11-07 DIAGNOSIS — H04123 Dry eye syndrome of bilateral lacrimal glands: Secondary | ICD-10-CM | POA: Diagnosis not present

## 2022-11-07 DIAGNOSIS — E119 Type 2 diabetes mellitus without complications: Secondary | ICD-10-CM | POA: Diagnosis not present

## 2022-11-07 DIAGNOSIS — H0102B Squamous blepharitis left eye, upper and lower eyelids: Secondary | ICD-10-CM | POA: Diagnosis not present

## 2022-11-07 DIAGNOSIS — F411 Generalized anxiety disorder: Secondary | ICD-10-CM | POA: Diagnosis not present

## 2022-11-07 DIAGNOSIS — H401222 Low-tension glaucoma, left eye, moderate stage: Secondary | ICD-10-CM | POA: Diagnosis not present

## 2022-11-07 DIAGNOSIS — I10 Essential (primary) hypertension: Secondary | ICD-10-CM | POA: Diagnosis not present

## 2022-11-07 DIAGNOSIS — H0102A Squamous blepharitis right eye, upper and lower eyelids: Secondary | ICD-10-CM | POA: Diagnosis not present

## 2022-11-07 DIAGNOSIS — H401211 Low-tension glaucoma, right eye, mild stage: Secondary | ICD-10-CM | POA: Diagnosis not present

## 2022-11-07 DIAGNOSIS — Z961 Presence of intraocular lens: Secondary | ICD-10-CM | POA: Diagnosis not present

## 2022-11-07 DIAGNOSIS — H35373 Puckering of macula, bilateral: Secondary | ICD-10-CM | POA: Diagnosis not present

## 2022-11-07 DIAGNOSIS — H35033 Hypertensive retinopathy, bilateral: Secondary | ICD-10-CM | POA: Diagnosis not present

## 2022-11-07 NOTE — Progress Notes (Signed)
Remote ICD transmission.   

## 2022-11-08 NOTE — Progress Notes (Signed)
EPIC Encounter for ICM Monitoring  Patient Name: Rickey Rice is a 82 y.o. male Date: 11/08/2022 Primary Care Physican: Renaye Rakers, MD Primary Cardiologist: [name] Electrophysiologist: Joycelyn Schmid Pacing:  >99%       06/15/2022 Office Weight: 307 lbs   Battery Longevity: 8.4 months                                         Spoke with patient and heart failure questions reviewed.  Transmission results reviewed.  Pt asymptomatic for fluid accumulation.  He was on vacation during decreased impedance.   Corvue Thoracic impedance suggesting normal fluid levels with the exception of possible fluid accumulation 7/10-7/17 and 7/28-8/7.   Prescribed:  Furosemide 20 mg take 1 tablet by mouth daily. Spironolactone 25 mg take 0.5 tablet (12.5 mg total) by mouth daily   Labs: 07/08/2019 Creatinine 1.49, BUN 21, Potassium 4.4, Sodium 141, GFR 44-51 05/27/2019 Creatinine 1.36, BUN 18, Potassium 4.5, Sodium 145, GFR 49-57 A complete set of results can be found in Results Review.   Recommendations:  No changes and encouraged to call if experiencing any fluid symptoms.   Follow-up plan: ICM clinic phone appointment on 12/11/2022.  91 day device clinic remote transmission 01/25/2023.       EP/Cardiology Office Visits:   12/05/2022 with Dr Rennis Golden.  Recall 12/22/2022 with Dr Graciela Husbands.     Copy of ICM check sent to Dr. Graciela Husbands.   3 month ICM trend: 11/06/2022.    12-14 Month ICM trend:     [name], RN 11/08/2022 4:37 PM

## 2022-12-05 ENCOUNTER — Ambulatory Visit (HOSPITAL_BASED_OUTPATIENT_CLINIC_OR_DEPARTMENT_OTHER): Payer: Medicare PPO | Admitting: Internal Medicine

## 2022-12-05 ENCOUNTER — Encounter (HOSPITAL_BASED_OUTPATIENT_CLINIC_OR_DEPARTMENT_OTHER): Payer: Self-pay | Admitting: Internal Medicine

## 2022-12-05 VITALS — BP 112/60 | HR 56 | Ht 71.5 in | Wt 305.0 lb

## 2022-12-05 DIAGNOSIS — E119 Type 2 diabetes mellitus without complications: Secondary | ICD-10-CM | POA: Diagnosis not present

## 2022-12-05 DIAGNOSIS — Z95810 Presence of automatic (implantable) cardiac defibrillator: Secondary | ICD-10-CM

## 2022-12-05 DIAGNOSIS — I5022 Chronic systolic (congestive) heart failure: Secondary | ICD-10-CM | POA: Diagnosis not present

## 2022-12-05 DIAGNOSIS — G4733 Obstructive sleep apnea (adult) (pediatric): Secondary | ICD-10-CM

## 2022-12-05 DIAGNOSIS — Z7984 Long term (current) use of oral hypoglycemic drugs: Secondary | ICD-10-CM | POA: Diagnosis not present

## 2022-12-05 DIAGNOSIS — I10 Essential (primary) hypertension: Secondary | ICD-10-CM | POA: Diagnosis not present

## 2022-12-05 NOTE — Progress Notes (Signed)
OFFICE NOTE  Chief Complaint:  Follow-up heart failure  Primary Care Physician: Renaye Rakers, MD  HPI:  Rickey Rice  is a 82 year old gentleman with a history of diabetes, hypertension, hypothyroidism, and morbid obesity. In 2010 he had complaints of chest pain and underwent stress testing with [facility] which was a 2-day nuclear stress test and was apparently negative. Recently he underwent a colonoscopy and a preoperative EKG was abnormal. I unfortunately do not have that EKG to review, but I did review the EKG from your office on April 09, 2012 which showed a borderline intraventricular conduction delay, a sinus rhythm at 66, and poor R-wave progression. Today in the office he has an abnormal EKG demonstrating a left bundle branch block with a QRS duration of 168 msec, heart rate of 78 in sinus. His only symptoms are shortness of breath with exertion. He underwent evaluation of his left bundle branch block in February 2014. This is a 2 day nuclear stress test which was negative for ischemia. There was a small inferior and apical defect which could represent scar versus artifact. EF was mildly reduced at 49%.    Rickey Rice returns today for followup. He underwent knee replacement surgery as we had authorized him to do. He is tolerated surgery without any complications. He reports that he is recovering fairly well and is more mobile than he had been in the past. He started to do some exercises at the [facility] and also this will translate into weight loss. Blood pressure has been stable he denies any chest pain.  He had blood work in January from his primary care provider which showed a total cholesterol of 128, HDL 51, triglycerides 53 and LDL 65. His hemoglobin A1c was 6.2.  Rickey Rice is now contemplating lethargy. Unfortunately has not been able to walk enough to lose weight in fact has gained some weight. 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 He feels like it's time to retire work on his generalized health. I would tend to agree with this.  I saw Rickey Rice back in the office today. He recently followed up from the hospital after having a sudden cardiac arrest. Unfortunately he was revived and is now status post AICD. He was to have CRT-D therapy, however there was difficulty with the LV lead, therefore he only has a single ventricle pacing with defibrillator functions. He is tolerating this well and had a small episode with fever postoperatively, but this was thought to be due to possibly a UTI. There is no evidence of pacemaker site fluctuance or swelling. Overall he feels he is doing well. He started exercising is managed to lose some weight. He is less short of breath and is more active.  Rickey Rice returns today for follow-up. Overall he is doing exceedingly well. He's managed to lose about 10 pounds. He is now below 300 pounds. His shortness of breath is improved some and he is ambulating better on his knees. He's had no device problems including no firings. He denies any chest pain or worsening shortness of breath and has not had any presyncopal or syncopal episodes.  Rickey Rice returns today for follow-up. Unfortunately he's gained some of his weight back. He denies a chest pain or worsening shortness of breath. He does not have any swelling. His last EF was as mentioned 40% in March 2016. EKG shows left bundle branch block. He scheduled to see Dr. Graciela Husbands back for device interrogation  next month.  11/12/2015  Rickey Rice was seen today in follow-up. Unfortunately he was recently seen for some weight gain and fatigue by Theodore Demark, PA-C. She felt that he might be getting some extra fluid and recommended a short trial of increased Lasix. Weight is actually been fairly stable now at 300-301. Around that time he was found to have a UTI was treated for that and his symptoms improved. He was also noted to have  problems with infection and underwent multiple rounds of antibiotics. This led to problems with diarrhea or digestive problems and ultimately he went on probiotics. Recently, though he was talking on the phone and had an episode of transient global amnesia shortly after his wife left the house. He called her back and she came home and noted that he may have had an episode with his defibrillator. This was subsequently confirmed to be VT and he underwent a shock for that. He is seen Dr. Graciela Husbands who increased his carvedilol to 12-1/2 mg twice a day. Blood pressure is actually a little low today but he reports being asymptomatic with that.  02/15/2016  Rickey Rice returns today for follow-up. He reports feeling well and denies worsening shortness of breath or chest pain. HE has not had any presyncopal spells. He just saw EP who noted his defibrillator was working properly. We again discussed strategies for heart failure management, including possibly switching lisinopril over to [facility]. He did not seem interested in this. It is noted his bp is higher today and that may at least allow uptitration of his lisinopril.  05/29/2016  Rickey Rice was seen today in follow-up. Weight is down slightly although has not made a significant improvement. He needs to start to do more exercise but he has blamed the weather and his arthritis. Blood pressure appears to be well-controlled and he brought readings in the office today. Although I do feel that there still potentially some room to consider adding Entresto. We discussed the medication today and it would effectively replace his lisinopril. He understands he would need to wash off the lisinopril for 36-48 hours before starting Entresto. We can provide him with samples and help him secure preauthorization.  12/28/2017  Rickey Rice is seen today in follow-up heart failure.  He reports NYHA class II symptoms, noting some shortness of breath with moderate exertion.  He has  recently had some weight loss.  He seems to be tolerating Entresto 24/26.  According to Dr. Odessa Fleming notes his LV lead is now activated.  He does appear to be biventricular pacing today.  Labs from July showed total cholesterol 143, HDL 52, LDL 79 triglycerides 42, hemoglobin A1c 5.8.  BNP in November was 155.  Recent remote check showed no change in thoracic impedance suggesting that he is compensated with his heart failure.  We previously discussed increasing the dose of Entresto as blood pressure would tolerate.  He is interested in getting a flu shot today.  11/13/2018  Rickey Rice is seen today back for follow-up of heart failure.  He continues to have NYHA class II symptoms.  He is struggling with weight.  Weight recently has gone up about 6 pounds.  He has been on the moderate dose of Entresto.  Unfortunately repeat echo earlier this year showed no significant improvement in LV function with his EF being 20 to 25%.  This is despite activation of his LV lead and biventricular pacing.  Remote check show stable thoracic impedance and no evidence of volume overload.  Finally, he has been diagnosed with obstructive sleep apnea and will begin treatment shortly.  Hopefully this will be additionally helpful.  02/13/2019  Rickey Rice returns today for follow-up of his heart failure.  Overall he continues to have NYHA class II symptoms.  He is been able to do most activities without incident.  LVEF remains depressed.  There is been no evidence of volume overload recently on his biventricular pacemaker.  Remote checks are undergoing.  Weight is down a few pounds.  He is try to make some dietary changes.  I added Aldactone and he is now on highest dose Entresto.  Plan is to repeat an echo likely in a few months to see if there is been any interval improvement in LV function.  Also, he was diagnosed with obstructive sleep apnea but is not yet been fitted with a mask.  He said when he was treated with that during  the titration study, the next day he felt the best he is ever felt was able to do many activities and generally had a good energy level.  Hopefully based on his insurance plan he will be able to get equipment beginning in January.  01/07/2020  Rickey Rice seen today in follow-up of his heart failure.  He underwent an echo in February 2021 which does show a small improvement in LVEF up to 35 to 40%.  He continues to have remote defibrillator checks which have been fairly normal.  He denies any shocks.  He reports fairly stable fluid status although weight has gone up to about 312 pounds today from around 300 pounds in the past.  He reports less physical activity and needs to make more appropriate dietary changes.  Despite this blood pressure is well controlled today 116/70.  EKG shows a biventricular paced rhythm.  Lipid profile shows total cholesterol 124, HDL 46, LDL 65 and triglycerides of 47.  Hemoglobin A1c of 5.6.  05/18/2021  Rickey Rice returns today for follow-up.  In January he told me that he had a call from Dr. Graciela Husbands that his device going off.  I do not see any evidence of this although he was noted to have mode switching and was noted to be in A-fib.  Dr. Graciela Husbands noted that he had SCAF (subclinical device detected afib) -I assured him that if he had had a shock that he would have been aware of it.  He is overdue for an echocardiogram last was in February 2021 which showed EF up to 35 to 40%.  He has had weight gain but no worsening edema.  His wife had COVID-19 a few weeks ago but was treated for that.  He tested negative.  Recent labs 5 days ago showed total cholesterol 142, HDL 52, LDL 77 and triglycerides 57, A1c 6.4% creatinine 1.59.  11/29/2021  Rickey Rice is seen today for follow-up of heart failure.  Overall he says he is doing well.  Fairly recently had an increase in edema.  He had apparently cut back his furosemide to 10 mg daily.  He said that he was prescribed that although I never  typically will prescribe this low of a dose.  I had thought he was on 20 mg daily.  His PCP added 40 mg to the 10 mg daily dose for about a week at which time he had significant diuresis and improvement in lower extremity edema.  Subsequent to that he had a pacemaker check which showed hypovolemia.  He has subsequently gone back to  the 10 mg dose, he believes but he will go home and check that dose.  He denies any chest pain.  He has had no further ICD firing.  He has a in person defibrillator check with Dr. Graciela Husbands in October.  EKG today shows a biventricular paced rhythm at 62.  06/15/2022  Rickey Rice returns today for follow-up.  Overall he says he is doing pretty well.  He has had remote checks and saw Dr. Graciela Husbands for his defibrillator back in October.  He was felt to be euvolemic.  This spring in February he had an increase in his OptiVol's but they did return back to normal.  He has used increased Lasix at times but recently has had increased fluid intake.  Overall he denies any chest pain and has had no syncopal episodes.  He will get short of breath with marked exertion.  He is still doing some photography.  Blood pressure is well-controlled today.  He was trialed on Ozempic for a while for weight loss but had some issues with it and stopped.  He intends to get back to doing some exercise.  12/05/2022  Rickey Rice is seen today in follow-up.  Overall he says he is feeling well.  His remote device checks indicate that he has had no worsening fluid accumulation.  He has an in office check with Dr. Graciela Husbands in December.  He was told recently that he has about 9 months left until elective replacement interval (ERI).  He denies any worsening shortness of breath or chest pain although he reports that he has had some muscle loss and wants to get back to exercising at the [facility].  He is on guideline directed medical therapy for heart failure with his most recent LVEF last year of between 40 to 45%.  He has had no  further device shocks to my knowledge.  PMHx:  Past Medical History:  Diagnosis Date   AICD (automatic cardioverter/defibrillator) present    Arthritis    oa   Cardiac arrest (HCC) 06/07/2014   Primary VF arrest with successful resuscitation and s/p ICD implant   Diabetes mellitus without complication (HCC)    type 2   Dyslipidemia    History of nuclear stress test 04/2012   lexiscan - 2 day protocol; low risk study, evidence of inferrior & apical scar but no ischemia    Hypertension    Hypothyroidism    LBBB (left bundle branch block)    Morbid obesity (HCC)    NICM (nonischemic cardiomyopathy) (HCC)    Prostate enlargement    pt denies    Past Surgical History:  Procedure Laterality Date   BI-VENTRICULAR IMPLANTABLE CARDIOVERTER DEFIBRILLATOR N/A 06/18/2014   STJ CRTD implanted by Dr Graciela Husbands   COLONOSCOPY W/ BIOPSIES     COLONOSCOPY WITH PROPOFOL N/A 04/05/2017   Procedure: COLONOSCOPY WITH PROPOFOL;  Surgeon: Charna Elizabeth, MD;  Location: [facility];  Service: Endoscopy;  Laterality: N/A;   FINGER SURGERY  1954   1 st joint  right hand amputated   KNEE ARTHROSCOPY Left    LEFT HEART CATHETERIZATION WITH CORONARY ANGIOGRAM N/A 06/12/2014   Procedure: LEFT HEART CATHETERIZATION WITH CORONARY ANGIOGRAM;  Surgeon: Marykay Lex, MD;  Location: [facility] CATH LAB;  Service: Cardiovascular;  Laterality: N/A;   MOUTH SURGERY  1963   TOTAL KNEE ARTHROPLASTY Right 02/05/2013   Procedure: TOTAL KNEE ARTHROPLASTY;  Surgeon: Nestor Lewandowsky, MD;  Location: [facility] OR;  Service: Orthopedics;  Laterality: Right;   TOTAL KNEE  ARTHROPLASTY Left 01/19/2014   Procedure: LEFT TOTAL KNEE ARTHROPLASTY;  Surgeon: Nestor Lewandowsky, MD;  Location: [facility] OR;  Service: Orthopedics;  Laterality: Left;    FAMHx:  Family History  Problem Relation Age of Onset   Cancer Mother    Kidney disease Brother    Hypertension Sister     SOCHx:   reports that he has never smoked. He has never used smokeless tobacco. He  reports current alcohol use. He reports that he does not use drugs.  ALLERGIES:  Allergies  Allergen Reactions   Bee Venom Swelling    Swelling on lips and tongue   Demerol [Meperidine]     Not sure   Pioglitazone     Lips swelling= actos   Shellfish Allergy     rash Other reaction(s): Unknown   Sulfa Antibiotics     Not sure   Shrimp Extract Itching    Rash    ROS: Pertinent items noted in HPI and remainder of comprehensive ROS otherwise negative.  HOME MEDS: Current Outpatient Medications  Medication Sig Dispense Refill   aspirin EC 81 MG tablet Take 81 mg by mouth daily.     brimonidine (ALPHAGAN) 0.2 % ophthalmic solution Place 1 drop into the left eye 3 (three) times daily.     carboxymethylcellulose (REFRESH PLUS) 0.5 % SOLN Place 1 drop into both eyes daily.     carvedilol (COREG) 12.5 MG tablet TAKE 1 TABLET BY MOUTH TWICE DAILY WITH A MEAL 180 tablet 3   dapagliflozin propanediol (FARXIGA) 10 MG TABS tablet Take 10 mg by mouth daily.     ENTRESTO 97-103 MG TAKE 1 TABLET BY MOUTH TWICE DAILY 60 tablet 9   furosemide (LASIX) 20 MG tablet TAKE 1 TABLET(20 MG) BY MOUTH DAILY 30 tablet 6   glucose blood test strip USE AS DIRECTED TO CHECK BLOOD SUGAR BID     JANUVIA 100 MG tablet Take 100 mg by mouth daily.     levothyroxine (SYNTHROID, LEVOTHROID) 75 MCG tablet Take 75 mcg by mouth every evening. Monday through Saturday     Multiple Vitamins-Minerals (ICAPS AREDS 2) CAPS Take 1 capsule by mouth 2 (two) times daily.     Omega-3 Fatty Acids (OMEGA 3 PO) Take 1,600 mg by mouth in the morning and at bedtime.     rosuvastatin (CRESTOR) 20 MG tablet Take 20 mg by mouth daily.     spironolactone (ALDACTONE) 25 MG tablet TAKE 1/2 TABLET(12.5 MG) BY MOUTH DAILY 45 tablet 3   No current facility-administered medications for this visit.    LABS/IMAGING: No results found for this or any previous visit (from the past 48 hour(s)). No results found.  VITALS: BP 112/60   Pulse  (!) 56   Ht 5' 11.5" (1.816 m)   Wt (!) 305 lb (138.3 kg)   BMI 41.95 kg/m   EXAM: General appearance: alert, no distress and morbidly obese Neck: no carotid bruit, no JVD and thyroid not enlarged, symmetric, no tenderness/mass/nodules Lungs: clear to auscultation bilaterally and AICD in left upper chest Heart: regular rate and rhythm, S1, S2 normal, no murmur, click, rub or gallop Abdomen: soft, non-tender; bowel sounds normal; no masses,  no organomegaly and Morbidly obese Extremities: extremities normal, atraumatic, no cyanosis or edema Pulses: 2+ and symmetric Skin: Skin color, texture, turgor normal. No rashes or lesions  EKG: EKG Interpretation Date/Time:  Tuesday December 05 2022 14:06:36 EDT Ventricular Rate:  56 PR Interval:  158 QRS Duration:  180 QT Interval:  486 QTC Calculation: 468 R Axis:   268  Text Interpretation: Atrial-sensed ventricular-paced rhythm Biventricular pacemaker detected When compared with ECG of 31-Jul-2018 10:13, PREVIOUS ECG IS PRESENT No significant change since last tracing Confirmed by Zoila Shutter [phone]) on 12/05/2022 2:24:33 PM    ASSESSMENT: Status post aborted sudden cardiac death-status post placement of a [employer] Bi-V AICD (LV lead is disabled due to dysfunction) Recent VT/VF with appropriate AICD shock Negative nuclear stress test for ischemia, fixed inferoapical defect suggesting possible scar versus artifact, EF 49% Diabetes type 2 Hypertension-well controlled Dyslipidemia on statin - well controlled Non-ischemic cardiomyopathy-EF 20-25%, improved to 35 to 40% (04/2019), possibly improved up to 40 to 45% by echo (2023) 8.   OSA -on CPAP, compliant  PLAN: 1.   Rickey Rice seems to be doing well and is stable with heart failure.  His weight is a couple pounds lighter than it was last time.  No evidence for fluid accumulation on his device checks.  He will be at [facility] in about 9 months.  He has follow-up with Dr. Graciela Husbands.  Blood  pressure is controlled today.  He has been 100% compliant with CPAP.  His medication therapy for heart failure is adequate.  No changes to his medicines today.  I will plan to see him back next summer at which time we may consider repeat echocardiography.  Chrystie Nose, MD, [facility], FACP  [place]  [facility]  Medical Director of the Advanced Lipid Disorders &  Cardiovascular Risk Reduction Clinic Diplomate of the American Board of Clinical Lipidology Attending Cardiologist  Direct Dial: [phone]  Fax: [phone]  Website:  [email or web address] Nicely Ariel Dimitri 12/05/2022, 2:24 PM

## 2022-12-05 NOTE — Patient Instructions (Signed)
Medication Instructions:  Your physician recommends that you continue on your current medications as directed. Please refer to the Current Medication list given to you today.  Follow-Up: At [facility], you and your health needs are our priority.  As part of our continuing mission to provide you with exceptional heart care, we have created designated Provider Care Teams.  These Care Teams include your primary Cardiologist (physician) and Advanced Practice Providers (APPs -  Physician Assistants and Nurse Practitioners) who all work together to provide you with the care you need, when you need it.  We recommend signing up for the patient portal called "MyChart".  Sign up information is provided on this After Visit Summary.  MyChart is used to connect with patients for Virtual Visits (Telemedicine).  Patients are able to view lab/test results, encounter notes, upcoming appointments, etc.  Non-urgent messages can be sent to your provider as well.   To learn more about what you can do with MyChart, go to [email or web address].    Your next appointment:   8-9 months with Dr. Rennis Golden in [facility]id Clinic

## 2022-12-11 ENCOUNTER — Ambulatory Visit: Payer: Medicare PPO | Attending: Internal Medicine

## 2022-12-11 DIAGNOSIS — Z95810 Presence of automatic (implantable) cardiac defibrillator: Secondary | ICD-10-CM

## 2022-12-11 DIAGNOSIS — I5022 Chronic systolic (congestive) heart failure: Secondary | ICD-10-CM | POA: Diagnosis not present

## 2022-12-12 DIAGNOSIS — S39012A Strain of muscle, fascia and tendon of lower back, initial encounter: Secondary | ICD-10-CM | POA: Diagnosis not present

## 2022-12-13 NOTE — Progress Notes (Signed)
EPIC Encounter for ICM Monitoring  Patient Name: Rickey Rice is a 82 y.o. male Date: 12/13/2022 Primary Care Physican: Renaye Rakers, MD Primary Cardiologist: [facility] Electrophysiologist: Joycelyn Schmid Pacing:  >99%       06/15/2022 Office Weight: 307 lbs 12/13/2022 Office Weight: 305 lbs   Battery Longevity: 6.9 months                                         Spoke with patient and heart failure questions reviewed.  Transmission results reviewed.  Pt asymptomatic for fluid accumulation.   He fell a week ago and had some x-rays done yesterday.  He is sore but no injuries.   Corvue Thoracic impedance suggesting normal fluid levels with the exception of possible fluid accumulation from 9/6-9/13 and 8/17-8/26.   Prescribed:  Furosemide 20 mg take 1 tablet by mouth daily. Spironolactone 25 mg take 0.5 tablet (12.5 mg total) by mouth daily   Labs: 07/08/2019 Creatinine 1.49, BUN 21, Potassium 4.4, Sodium 141, GFR 44-51 05/27/2019 Creatinine 1.36, BUN 18, Potassium 4.5, Sodium 145, GFR 49-57 A complete set of results can be found in Results Review.   Recommendations:  No changes and encouraged to call if experiencing any fluid symptoms.   Follow-up plan: ICM clinic phone appointment on 01/15/2023.  91 day device clinic remote transmission 01/25/2023.       EP/Cardiology Office Visits:   Recall 08/02/2023 with Dr Rennis Golden.  02/27/2023 with Dr Graciela Husbands.     Copy of ICM check sent to Dr. Graciela Husbands.   3 month ICM trend: 12/11/2022.    12-14 Month ICM trend:     [name], RN 12/13/2022 1:26 PM

## 2023-01-15 ENCOUNTER — Ambulatory Visit: Payer: Medicare PPO | Attending: Internal Medicine

## 2023-01-15 DIAGNOSIS — Z95810 Presence of automatic (implantable) cardiac defibrillator: Secondary | ICD-10-CM | POA: Diagnosis not present

## 2023-01-15 DIAGNOSIS — I5022 Chronic systolic (congestive) heart failure: Secondary | ICD-10-CM | POA: Diagnosis not present

## 2023-01-16 NOTE — Progress Notes (Signed)
EPIC Encounter for ICM Monitoring  Patient Name: Rickey Rice is a 82 y.o. male Date: 01/16/2023 Primary Care Physican: Renaye Rakers, MD Primary Cardiologist: [facility] Electrophysiologist: Joycelyn Schmid Pacing:  >99%       06/15/2022 Office Weight: 307 lbs 12/13/2022 Office Weight: 305 lbs   Battery Longevity: 5.7 months                                         Spoke with patient and heart failure questions reviewed.  Transmission results reviewed.  Pt asymptomatic for fluid accumulation.   He has been stressed related to many deaths of friends and family in the last 2 weeks.  He thinks stress has contributed to fluid accumulation.   Corvue Thoracic impedance suggesting possible fluid accumulation from 10/18.   Prescribed:  Furosemide 20 mg take 1 tablet by mouth daily. Spironolactone 25 mg take 0.5 tablet (12.5 mg total) by mouth daily   Labs: 07/08/2019 Creatinine 1.49, BUN 21, Potassium 4.4, Sodium 141, GFR 44-51 05/27/2019 Creatinine 1.36, BUN 18, Potassium 4.5, Sodium 145, GFR 49-57 A complete set of results can be found in Results Review.   Recommendations:  Recommendation to monitor salt and fluid intake.  Encouraged to call if experiencing any fluid symptoms.   Copy sent to Dr Rennis Golden for review and recommendations if needed.     Follow-up plan: ICM clinic phone appointment on 01/22/2023 to recheck fluid levels.  91 day device clinic remote transmission 01/25/2023.       EP/Cardiology Office Visits:   Recall 08/02/2023 with Dr Rennis Golden.  02/27/2023 with Dr Graciela Husbands.     Copy of ICM check sent to Dr. Graciela Husbands.   3 month ICM trend: 01/15/2023.    12-14 Month ICM trend:     [name], RN 01/16/2023 10:38 AM

## 2023-01-16 NOTE — Progress Notes (Signed)
Spoke with patient and advised Dr Rennis Golden recommended he can take Lasix 20 mg twice a day for 3 days for any fluid symptoms then return to 20 mg daily.  He verbalized understanding and agreed with plan.

## 2023-01-16 NOTE — Progress Notes (Signed)
  Received: Today Rickey Rice, Rickey Abu, MD  Manning Luna, Josephine Igo, RN Agree . Thanks .Marland Kitchen If he has symptoms of volume overload, can increase lasix to 20 mg BID for 3 days and go back to 20 mg daily.  Dr Rennis Golden

## 2023-01-22 ENCOUNTER — Ambulatory Visit: Payer: Medicare PPO | Attending: Internal Medicine

## 2023-01-22 DIAGNOSIS — I5022 Chronic systolic (congestive) heart failure: Secondary | ICD-10-CM

## 2023-01-22 DIAGNOSIS — Z95810 Presence of automatic (implantable) cardiac defibrillator: Secondary | ICD-10-CM

## 2023-01-24 NOTE — Progress Notes (Signed)
Received updated report. Spoke with patient.   01/24/2023 Corvue thoracic impedance suggesting fluid levels returning close to normal.     Sent to Dr Rennis Golden for review and recommendations gain due to possible fluid accumulation returned which may be related to patient's traveling over the weekend.  His fluid and salt intake increased for 3 days.

## 2023-01-24 NOTE — Progress Notes (Signed)
Chrystie Nose, MD  Gabriela Irigoyen, Josephine Igo, RN Thanks .Marland Kitchen Have him continue to monitor weight, no changes  Dr Rexene Edison

## 2023-01-24 NOTE — Progress Notes (Signed)
Spoke with patient and advised Dr Rennis Golden recommended to monitor weight for fluid.  Encouraged to limit salt and fluid intake.

## 2023-01-24 NOTE — Progress Notes (Signed)
EPIC Encounter for ICM Monitoring  Patient Name: Rickey Rice is a 82 y.o. male Date: 01/24/2023 Primary Care Physican: Renaye Rakers, MD Primary Cardiologist: [facility] Electrophysiologist: Joycelyn Schmid Pacing:  >99%       06/15/2022 Office Weight: 307 lbs 12/13/2022 Office Weight: 305 lbs   Battery Longevity: 5.7 months                                         Spoke with patient and heart failure questions reviewed.  Transmission results reviewed.  Pt reported being out of town over the weekend and increased fluid and salt intake contributing to fluid accumulation returning.      Corvue Thoracic impedance suggesting fluid levels returned to normal after taking the recommended Lasix 20 mg bid x 3 days but possible fluid accumulation returning 10/26.   Prescribed:  Furosemide 20 mg take 1 tablet by mouth daily. Spironolactone 25 mg take 0.5 tablet (12.5 mg total) by mouth daily   Labs: 07/08/2019 Creatinine 1.49, BUN 21, Potassium 4.4, Sodium 141, GFR 44-51 05/27/2019 Creatinine 1.36, BUN 18, Potassium 4.5, Sodium 145, GFR 49-57 A complete set of results can be found in Results Review.   Recommendations:  Advised to limit salt and fluid intake as recommended.  He will send updated report today, 10/30 for review.     Follow-up plan: ICM clinic phone appointment on 01/29/2023 to recheck fluid levels.  91 day device clinic remote transmission 01/25/2023.       EP/Cardiology Office Visits:   Recall 08/02/2023 with Dr Rennis Golden.  02/27/2023 with Dr Graciela Husbands.     Copy of ICM check sent to Dr. Graciela Husbands.   3 month ICM trend: 01/22/2023.    12-14 Month ICM trend:     [name], RN 01/24/2023 7:41 AM

## 2023-01-25 ENCOUNTER — Ambulatory Visit (INDEPENDENT_AMBULATORY_CARE_PROVIDER_SITE_OTHER): Payer: Medicare PPO

## 2023-01-25 ENCOUNTER — Telehealth: Payer: Self-pay

## 2023-01-25 DIAGNOSIS — I428 Other cardiomyopathies: Secondary | ICD-10-CM

## 2023-01-25 LAB — CUP PACEART REMOTE DEVICE CHECK
Battery Remaining Longevity: 6 mo
Battery Remaining Percentage: 6 %
Battery Voltage: 2.63 V
Brady Statistic AP VP Percent: 1 %
Brady Statistic AP VS Percent: 1 %
Brady Statistic AS VP Percent: 99 %
Brady Statistic AS VS Percent: 1 %
Brady Statistic RA Percent Paced: 1 %
Date Time Interrogation Session: 20241030155351
HighPow Impedance: 61 Ohm
HighPow Impedance: 61 Ohm
Implantable Lead Connection Status: 753985
Implantable Lead Connection Status: 753985
Implantable Lead Connection Status: 753985
Implantable Lead Implant Date: 20160324
Implantable Lead Implant Date: 20160324
Implantable Lead Implant Date: 20160324
Implantable Lead Location: 753858
Implantable Lead Location: 753859
Implantable Lead Location: 753860
Implantable Lead Model: 7122
Implantable Pulse Generator Implant Date: 20160324
Lead Channel Impedance Value: 380 Ohm
Lead Channel Impedance Value: 460 Ohm
Lead Channel Impedance Value: 530 Ohm
Lead Channel Pacing Threshold Amplitude: 1 V
Lead Channel Pacing Threshold Amplitude: 1 V
Lead Channel Pacing Threshold Amplitude: 1.5 V
Lead Channel Pacing Threshold Pulse Width: 0.5 ms
Lead Channel Pacing Threshold Pulse Width: 0.5 ms
Lead Channel Pacing Threshold Pulse Width: 0.5 ms
Lead Channel Sensing Intrinsic Amplitude: 12 mV
Lead Channel Sensing Intrinsic Amplitude: 2.2 mV
Lead Channel Setting Pacing Amplitude: 2 V
Lead Channel Setting Pacing Amplitude: 2 V
Lead Channel Setting Pacing Amplitude: 2.5 V
Lead Channel Setting Pacing Pulse Width: 0.5 ms
Lead Channel Setting Pacing Pulse Width: 0.5 ms
Lead Channel Setting Sensing Sensitivity: 0.5 mV
Pulse Gen Serial Number: 7240350
Zone Setting Status: 755011

## 2023-01-25 NOTE — Telephone Encounter (Signed)
Patient needs monthly battery checks added. Jacki Cones, are you able to add then on your monthly checks? I did not want to change the dates and mess up your dates for checks.

## 2023-01-26 ENCOUNTER — Ambulatory Visit
Admission: EM | Admit: 2023-01-26 | Discharge: 2023-01-26 | Disposition: A | Discharge status: Home / Self Care | Payer: Medicare PPO | Attending: Family Medicine | Admitting: Family Medicine

## 2023-01-26 ENCOUNTER — Encounter: Payer: Self-pay | Admitting: Emergency Medicine

## 2023-01-26 DIAGNOSIS — T148XXA Other injury of unspecified body region, initial encounter: Secondary | ICD-10-CM | POA: Diagnosis not present

## 2023-01-26 DIAGNOSIS — Z23 Encounter for immunization: Secondary | ICD-10-CM

## 2023-01-26 MED ORDER — CHLORHEXIDINE GLUCONATE 4 % EX SOLN: Freq: Every day | CUTANEOUS | 0 refills | Status: DC | PRN

## 2023-01-26 MED ORDER — CEPHALEXIN 500 MG PO CAPS: 500.0000 mg | ORAL_CAPSULE | Freq: Two times a day (BID) | ORAL | 0 refills | Status: DC

## 2023-01-26 MED ORDER — TETANUS-DIPHTH-ACELL PERTUSSIS 5-2.5-18.5 LF-MCG/0.5 IM SUSY
0.5000 mL | PREFILLED_SYRINGE | Freq: Once | INTRAMUSCULAR | Status: AC
  Administered 2023-01-26: 0.5 mL via INTRAMUSCULAR

## 2023-01-26 MED ORDER — MUPIROCIN 2 % EX OINT: 1.0000 | TOPICAL_OINTMENT | Freq: Two times a day (BID) | CUTANEOUS | 0 refills | Status: DC

## 2023-01-26 NOTE — ED Provider Notes (Addendum)
[facility]    CSN: [id number] Arrival date & time: 01/26/23  1233      History   Chief Complaint No chief complaint on file.   HPI Rickey Rice is a 82 y.o. male.   Patient presenting today with 2 abrasions to the left lower leg that occurred when he rolled out of a chair and hit the leg.  He states this happened 2 days ago and when he woke up this morning the foot and leg was a bit swollen.  Denies drainage, bleeding, fevers, chills, body aches.  Has tried to keep the area clean and covered the best he could.  He does have a history of diabetes so he is concerned with infection.    Past Medical History:  Diagnosis Date   AICD (automatic cardioverter/defibrillator) present    Arthritis    oa   Cardiac arrest (HCC) 06/07/2014   Primary VF arrest with successful resuscitation and s/p ICD implant   Diabetes mellitus without complication (HCC)    type 2   Dyslipidemia    History of nuclear stress test 04/2012   lexiscan - 2 day protocol; low risk study, evidence of inferrior & apical scar but no ischemia    Hypertension    Hypothyroidism    LBBB (left bundle branch block)    Morbid obesity (HCC)    NICM (nonischemic cardiomyopathy) (HCC)    Prostate enlargement    pt denies    Patient Active Problem List   Diagnosis Date Noted   Biventricular ICD (implantable cardioverter-defibrillator) in place 09/12/2020   OSA (obstructive sleep apnea) 12/19/2018   Salivary stone 02/01/2018   Impacted cerumen of both ears 05/16/2017   Sensorineural hearing loss (SNHL), bilateral 05/16/2017   Encounter for immunization 02/15/2016   VT (ventricular tachycardia) (HCC) 11/12/2015   Chronic systolic congestive heart failure/diastolic (HCC) 09/24/2015   S/P implantation of automatic cardioverter/defibrillator (AICD) 07/26/2014   CKD (chronic kidney disease) stage 2, GFR 60-89 ml/min 06/11/2014   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) 06/11/2014   Benign prostatic  hyperplasia 06/11/2014   Obesity, morbid (HCC) 06/11/2014   Essential hypertension    NICM (nonischemic cardiomyopathy) (HCC) 06/08/2014   History of cardiac arrest    PEA (Pulseless electrical activity) (HCC) 06/07/2014   Degenerative arthritis of left knee 01/19/2014   Arthritis of left knee 01/19/2014   Constipation 03/31/2013   Difficulty walking 03/12/2013   Stiffness of right knee 03/12/2013   Right leg weakness 03/12/2013   Osteoarthritis of right knee 02/06/2013   LBBB (left bundle branch block) 01/07/2013   DM2 (diabetes mellitus, type 2) (HCC) 01/07/2013   Hypothyroidism 01/07/2013   Morbid obesity (HCC) 01/07/2013   Dyslipidemia 01/07/2013    Past Surgical History:  Procedure Laterality Date   BI-VENTRICULAR IMPLANTABLE CARDIOVERTER DEFIBRILLATOR N/A 06/18/2014   STJ CRTD implanted by Dr Graciela Husbands   COLONOSCOPY W/ BIOPSIES     COLONOSCOPY WITH PROPOFOL N/A 04/05/2017   Procedure: COLONOSCOPY WITH PROPOFOL;  Surgeon: Charna Elizabeth, MD;  Location: [facility];  Service: Endoscopy;  Laterality: N/A;   FINGER SURGERY  1954   1 st joint  right hand amputated   KNEE ARTHROSCOPY Left    LEFT HEART CATHETERIZATION WITH CORONARY ANGIOGRAM N/A 06/12/2014   Procedure: LEFT HEART CATHETERIZATION WITH CORONARY ANGIOGRAM;  Surgeon: Marykay Lex, MD;  Location: [facility] CATH LAB;  Service: Cardiovascular;  Laterality: N/A;   MOUTH SURGERY  1963   TOTAL KNEE ARTHROPLASTY Right 02/05/2013   Procedure:  TOTAL KNEE ARTHROPLASTY;  Surgeon: Nestor Lewandowsky, MD;  Location: [facility] OR;  Service: Orthopedics;  Laterality: Right;   TOTAL KNEE ARTHROPLASTY Left 01/19/2014   Procedure: LEFT TOTAL KNEE ARTHROPLASTY;  Surgeon: Nestor Lewandowsky, MD;  Location: [facility] OR;  Service: Orthopedics;  Laterality: Left;       Home Medications    Prior to Admission medications   Medication Sig Start Date End Date Taking? Authorizing Provider  cephALEXin (KEFLEX) 500 MG capsule Take 1 capsule (500 mg total) by mouth 2 (two)  times daily. 01/26/23  Yes Particia Nearing, PA-C  chlorhexidine (HIBICLENS) 4 % external liquid Apply topically daily as needed. 01/26/23  Yes Particia Nearing, PA-C  mupirocin ointment (BACTROBAN) 2 % Apply 1 Application topically 2 (two) times daily. 01/26/23  Yes Particia Nearing, PA-C  aspirin EC 81 MG tablet Take 81 mg by mouth daily.    [provider]  brimonidine (ALPHAGAN) 0.2 % ophthalmic solution Place 1 drop into the left eye 3 (three) times daily.    [provider]  carboxymethylcellulose (REFRESH PLUS) 0.5 % SOLN Place 1 drop into both eyes daily.    [provider]  carvedilol (COREG) 12.5 MG tablet TAKE 1 TABLET BY MOUTH TWICE DAILY WITH A MEAL 05/30/22   [name], Lisette Abu, MD  dapagliflozin propanediol (FARXIGA) 10 MG TABS tablet Take 10 mg by mouth daily.    [provider]  ENTRESTO 97-103 MG TAKE 1 TABLET BY MOUTH TWICE DAILY 08/14/22   [name], Lisette Abu, MD  furosemide (LASIX) 20 MG tablet TAKE 1 TABLET(20 MG) BY MOUTH DAILY 09/06/22   [name], Lisette Abu, MD  glucose blood test strip USE AS DIRECTED TO CHECK BLOOD SUGAR BID 01/21/18   [provider]  JANUVIA 100 MG tablet Take 100 mg by mouth daily. 04/28/22   [provider]  levothyroxine (SYNTHROID, LEVOTHROID) 75 MCG tablet Take 75 mcg by mouth every evening. Monday through Saturday    [provider]  Multiple Vitamins-Minerals (ICAPS AREDS 2) CAPS Take 1 capsule by mouth 2 (two) times daily.    [provider]  Omega-3 Fatty Acids (OMEGA 3 PO) Take 1,600 mg by mouth in the morning and at bedtime.    [provider]  rosuvastatin (CRESTOR) 20 MG tablet Take 20 mg by mouth daily. 12/09/21   [provider]  spironolactone (ALDACTONE) 25 MG tablet TAKE 1/2 TABLET(12.5 MG) BY MOUTH DAILY 07/21/22   [name], Lisette Abu, MD    Family History Family History  Problem Relation Age of Onset   Cancer Mother    Kidney disease Brother     Hypertension Sister     Social History Social History   Tobacco Use   Smoking status: Never   Smokeless tobacco: Never  Vaping Use   Vaping status: Never Used  Substance Use Topics   Alcohol use: Yes    Comment: rare wine use   Drug use: No     Allergies   Bee venom, Demerol [meperidine], Pioglitazone, Shellfish allergy, Sulfa antibiotics, and Shrimp extract   Review of Systems Review of Systems Per HPI  Physical Exam Triage Vital Signs ED Triage Vitals  Encounter Vitals Group     BP 01/26/23 1314 111/61     Systolic BP Percentile --      Diastolic BP Percentile --      Pulse Rate 01/26/23 1314 63     Resp 01/26/23 1314 18     Temp 01/26/23 1314  98.5 F (36.9 C)     Temp Source 01/26/23 1314 Oral     SpO2 01/26/23 1314 97 %     Weight --      Height --      Head Circumference --      Peak Flow --      Pain Score 01/26/23 1317 2     Pain Loc --      Pain Education --      Exclude from Growth Chart --    No data found.  Updated Vital Signs BP 111/61 (BP Location: Right Arm)   Pulse 63   Temp 98.5 F (36.9 C) (Oral)   Resp 18   SpO2 97%   Visual Acuity Right Eye Distance:   Left Eye Distance:   Bilateral Distance:    Right Eye Near:   Left Eye Near:    Bilateral Near:     Physical Exam Vitals and nursing note reviewed.  Constitutional:      Appearance: Normal appearance.  HENT:     Head: Atraumatic.  Eyes:     Extraocular Movements: Extraocular movements intact.     Conjunctiva/sclera: Conjunctivae normal.  Cardiovascular:     Rate and Rhythm: Normal rate.  Pulmonary:     Effort: Pulmonary effort is normal.  Musculoskeletal:        General: Swelling present. Normal range of motion.     Cervical back: Normal range of motion and neck supple.     Comments: 1+ edema diffusely to the left lower extremity  Skin:    General: Skin is warm.     Comments: 2 areas of superficial abrasion about 1 to 2 cm in diameter to the left lower lateral leg,  no active bleeding, drainage  Neurological:     General: No focal deficit present.     Mental Status: He is oriented to person, place, and time.     Motor: No weakness.     Gait: Gait normal.     Comments: Left lower extremity neurovascular intact  Psychiatric:        Mood and Affect: Mood normal.        Thought Content: Thought content normal.        Judgment: Judgment normal.      UC Treatments / Results  Labs (all labs ordered are listed, but only abnormal results are displayed) Labs Reviewed - No data to display  EKG   Radiology CUP PACEART REMOTE DEVICE CHECK  Result Date: 01/25/2023 Scheduled remote reviewed. Normal device function.  Battery estimated 5.13mo - route to triage for IFU 1 AMS, 4sec in duration F/u as scheduled LA, CVRS   Procedures Procedures (including critical care time)  Medications Ordered in UC Medications  Tdap (BOOSTRIX) injection 0.5 mL (has no administration in time range)    Initial Impression / Assessment and Plan / UC Course  I have reviewed the triage vital signs and the nursing notes.  Pertinent labs & imaging results that were available during my care of the patient were reviewed by me and considered in my medical decision making (see chart for details).     Superficial abrasion, no evidence of infection at this time.  Will try to manage topically with mupirocin, Hibiclens and nonstick dressings as well as management of lower extremity edema with compression stockings and leg elevation.  Discussed oral antibiotics if worsening signs of infection to include worsening site swelling, redness, thick yellow drainage, fevers.  Return for worsening symptoms.  Tdap updated additionally as he is unsure when his last 1 was.  Final Clinical Impressions(s) / UC Diagnoses   Final diagnoses:  Skin abrasion     Discharge Instructions      Clean the area at least once a day with the Hibiclens solution and apply the mupirocin ointment and a  nonstick dressing.  Keep the leg elevated and wear compression stockings to help with swelling in the leg.  If the area becomes significantly red, warm, having any thick drainage or other concerning signs you may start the oral antibiotic but I am hopeful that we will be able to avoid this at this time.    ED Prescriptions     Medication Sig Dispense Auth. Provider   chlorhexidine (HIBICLENS) 4 % external liquid Apply topically daily as needed. 236 mL Particia Nearing, PA-C   mupirocin ointment (BACTROBAN) 2 % Apply 1 Application topically 2 (two) times daily. 22 g Particia Nearing, [place]   cephALEXin (KEFLEX) 500 MG capsule Take 1 capsule (500 mg total) by mouth 2 (two) times daily. 14 capsule Particia Nearing, [place]      PDMP not reviewed this encounter.   Particia Nearing, [place] 01/26/23 1408    Particia Nearing, PA-C 01/26/23 1408    Roosvelt Maser [place], [place] 01/26/23 1408

## 2023-01-26 NOTE — ED Triage Notes (Signed)
Larey Seat Wednesday has 2 abrasion to left lower leg.  States foot and lower leg is swollen.  States he rolled out of a chair and hit his leg.

## 2023-01-26 NOTE — Discharge Instructions (Signed)
Clean the area at least once a day with the Hibiclens solution and apply the mupirocin ointment and a nonstick dressing.  Keep the leg elevated and wear compression stockings to help with swelling in the leg.  If the area becomes significantly red, warm, having any thick drainage or other concerning signs you may start the oral antibiotic but I am hopeful that we will be able to avoid this at this time.

## 2023-01-29 ENCOUNTER — Ambulatory Visit: Payer: Medicare PPO | Attending: Internal Medicine

## 2023-01-29 DIAGNOSIS — Z95810 Presence of automatic (implantable) cardiac defibrillator: Secondary | ICD-10-CM

## 2023-01-29 DIAGNOSIS — I5022 Chronic systolic (congestive) heart failure: Secondary | ICD-10-CM

## 2023-01-29 NOTE — Progress Notes (Unsigned)
EPIC Encounter for ICM Monitoring  Patient Name: Rickey Rice is a 82 y.o. male Date: 01/29/2023 Primary Care Physican: Renaye Rakers, MD Primary Cardiologist: [facility] Electrophysiologist: Joycelyn Schmid Pacing:  >99%       06/15/2022 Office Weight: 307 lbs 12/13/2022 Office Weight: 305 lbs 01/29/2023 Weight: 300 lbs   Battery Longevity: 5.6 months                                         Attempted call to patient and unable to reach.  Left detailed message per DPR regarding transmission. Transmission reviewed.      Corvue Thoracic impedance suggesting fluid levels worse compared to 10/28 remote transmission.   Prescribed:  Furosemide 20 mg take 1 tablet by mouth daily. Spironolactone 25 mg take 0.5 tablet (12.5 mg total) by mouth daily   Labs: 07/08/2019 Creatinine 1.49, BUN 21, Potassium 4.4, Sodium 141, GFR 44-51 05/27/2019 Creatinine 1.36, BUN 18, Potassium 4.5, Sodium 145, GFR 49-57 A complete set of results can be found in Results Review.   Recommendations:  Left voice mail with ICM number and encouraged to call if experiencing any fluid symptoms.   Follow-up plan: ICM clinic phone appointment on 02/05/2023 to recheck fluid levels.  91 day device clinic remote transmission 04/26/2023.       EP/Cardiology Office Visits:   Recall 08/02/2023 with Dr Rennis Golden.  02/27/2023 with Dr Graciela Husbands.     Copy of ICM check sent to Dr. Graciela Husbands.   3 month ICM trend: 01/29/2023.    12-14 Month ICM trend:     [name], RN 01/29/2023 1:22 PM

## 2023-01-29 NOTE — Progress Notes (Unsigned)
Spoke with patient and heart failure questions reviewed.  Transmission results reviewed.  Pt reports weight is stable at 300 lbs. He is feeling fine and does not have any fluid symptoms.    Diet:  He has been eating at restaurants a lot over the last week.   He is feeling stress related to several deaths of friends recently.    Discussed limiting salt to 2000 mg daily and fluid intake to 64 oz daily  Pt plans on self adjusting Furosemide and take 40 mg daily x 2 days.  Advised will send his plan to Dr Rennis Golden for review and any further recommendations.

## 2023-01-30 NOTE — Progress Notes (Signed)
Message Received: Delorse Lek, Lisette Abu, MD  Mylene Bow, Josephine Igo, RN Am I interpreting this wrong? It says he is 300 lbs, which is lower and had no fluid overload symptoms and the graph shows the thoracic impedence declining - so, seems like it is better- what did you mean by "fluid accumulation has not resolved?"  Dr Rexene Edison       Message Received: Delorse Lek, Lisette Abu, MD  Kenley Rettinger, Josephine Igo, RN Thanks .Marland Kitchen I discussed it with Dr. Salena Saner - I was misinterpreting the graph.  My apologies .Marland Kitchen I see where it shows possible fluid gain - with his weight down and no symptoms, he should monitor and use extra lasix a directed if weight goes up or he is symptomatic or swollen.  Sorry for the confusion on my part.

## 2023-01-30 NOTE — Progress Notes (Signed)
Spoke with patient and advised Dr Rennis Golden approved he can take extra lasix as needed for fluid symptoms.  He took 40 mg of Lasix today and will do so again tomorrow.   He appreciated the call back.

## 2023-01-31 ENCOUNTER — Other Ambulatory Visit: Payer: Self-pay

## 2023-01-31 ENCOUNTER — Telehealth: Payer: Self-pay

## 2023-01-31 ENCOUNTER — Emergency Department (HOSPITAL_COMMUNITY)
Admission: EM | Admit: 2023-01-31 | Discharge: 2023-01-31 | Disposition: A | Discharge status: Home / Self Care | Payer: Medicare PPO

## 2023-01-31 ENCOUNTER — Emergency Department (HOSPITAL_COMMUNITY): Payer: Medicare PPO

## 2023-01-31 ENCOUNTER — Encounter (HOSPITAL_COMMUNITY): Payer: Self-pay

## 2023-01-31 DIAGNOSIS — I130 Hypertensive heart and chronic kidney disease with heart failure and stage 1 through stage 4 chronic kidney disease, or unspecified chronic kidney disease: Secondary | ICD-10-CM | POA: Insufficient documentation

## 2023-01-31 DIAGNOSIS — I509 Heart failure, unspecified: Secondary | ICD-10-CM | POA: Diagnosis not present

## 2023-01-31 DIAGNOSIS — N189 Chronic kidney disease, unspecified: Secondary | ICD-10-CM | POA: Diagnosis not present

## 2023-01-31 DIAGNOSIS — E860 Dehydration: Secondary | ICD-10-CM | POA: Insufficient documentation

## 2023-01-31 DIAGNOSIS — E1122 Type 2 diabetes mellitus with diabetic chronic kidney disease: Secondary | ICD-10-CM | POA: Insufficient documentation

## 2023-01-31 DIAGNOSIS — Z79899 Other long term (current) drug therapy: Secondary | ICD-10-CM | POA: Insufficient documentation

## 2023-01-31 DIAGNOSIS — Z950 Presence of cardiac pacemaker: Secondary | ICD-10-CM | POA: Diagnosis not present

## 2023-01-31 DIAGNOSIS — R0989 Other specified symptoms and signs involving the circulatory and respiratory systems: Secondary | ICD-10-CM | POA: Diagnosis not present

## 2023-01-31 DIAGNOSIS — I4510 Unspecified right bundle-branch block: Secondary | ICD-10-CM | POA: Diagnosis not present

## 2023-01-31 DIAGNOSIS — R42 Dizziness and giddiness: Secondary | ICD-10-CM | POA: Diagnosis not present

## 2023-01-31 DIAGNOSIS — Z7984 Long term (current) use of oral hypoglycemic drugs: Secondary | ICD-10-CM | POA: Diagnosis not present

## 2023-01-31 DIAGNOSIS — Z7982 Long term (current) use of aspirin: Secondary | ICD-10-CM | POA: Insufficient documentation

## 2023-01-31 DIAGNOSIS — R531 Weakness: Secondary | ICD-10-CM | POA: Diagnosis not present

## 2023-01-31 DIAGNOSIS — R55 Syncope and collapse: Secondary | ICD-10-CM | POA: Diagnosis present

## 2023-01-31 DIAGNOSIS — E039 Hypothyroidism, unspecified: Secondary | ICD-10-CM | POA: Diagnosis not present

## 2023-01-31 DIAGNOSIS — I959 Hypotension, unspecified: Secondary | ICD-10-CM | POA: Diagnosis not present

## 2023-01-31 DIAGNOSIS — R0689 Other abnormalities of breathing: Secondary | ICD-10-CM | POA: Diagnosis not present

## 2023-01-31 LAB — COMPREHENSIVE METABOLIC PANEL
ALT: 18 U/L (ref 0–44)
AST: 21 U/L (ref 15–41)
Albumin: 3.4 g/dL — ABNORMAL LOW (ref 3.5–5.0)
Alkaline Phosphatase: 57 U/L (ref 38–126)
Anion gap: 11 (ref 5–15)
BUN: 28 mg/dL — ABNORMAL HIGH (ref 8–23)
CO2: 23 mmol/L (ref 22–32)
Calcium: 8.6 mg/dL — ABNORMAL LOW (ref 8.9–10.3)
Chloride: 106 mmol/L (ref 98–111)
Creatinine, Ser: 1.78 mg/dL — ABNORMAL HIGH (ref 0.61–1.24)
GFR, Estimated: 38 mL/min — ABNORMAL LOW (ref >60)
Glucose, Bld: 135 mg/dL — ABNORMAL HIGH (ref 70–99)
Potassium: 4 mmol/L (ref 3.5–5.1)
Sodium: 140 mmol/L (ref 135–145)
Total Bilirubin: 0.6 mg/dL (ref <1.2)
Total Protein: 6.7 g/dL (ref 6.5–8.1)

## 2023-01-31 LAB — CBC WITH DIFFERENTIAL/PLATELET
Abs Immature Granulocytes: 0.02 10*3/uL (ref 0.00–0.07)
Basophils Absolute: 0 10*3/uL (ref 0.0–0.1)
Basophils Relative: 0 %
Eosinophils Absolute: 0.1 10*3/uL (ref 0.0–0.5)
Eosinophils Relative: 3 %
HCT: 36.8 % — ABNORMAL LOW (ref 39.0–52.0)
Hemoglobin: 11.4 g/dL — ABNORMAL LOW (ref 13.0–17.0)
Immature Granulocytes: 0 %
Lymphocytes Relative: 17 %
Lymphs Abs: 1 10*3/uL (ref 0.7–4.0)
MCH: 31.8 pg (ref 26.0–34.0)
MCHC: 31 g/dL (ref 30.0–36.0)
MCV: 102.5 fL — ABNORMAL HIGH (ref 80.0–100.0)
Monocytes Absolute: 0.7 10*3/uL (ref 0.1–1.0)
Monocytes Relative: 12 %
Neutro Abs: 3.8 10*3/uL (ref 1.7–7.7)
Neutrophils Relative %: 68 %
Platelets: 123 10*3/uL — ABNORMAL LOW (ref 150–400)
RBC: 3.59 MIL/uL — ABNORMAL LOW (ref 4.22–5.81)
RDW: 13.3 % (ref 11.5–15.5)
WBC: 5.6 10*3/uL (ref 4.0–10.5)
nRBC: 0 % (ref 0.0–0.2)

## 2023-01-31 LAB — LACTIC ACID, PLASMA: Lactic Acid, Venous: 1.5 mmol/L (ref 0.5–1.9)

## 2023-01-31 LAB — CBG MONITORING, ED: Glucose-Capillary: 138 mg/dL — ABNORMAL HIGH (ref 70–99)

## 2023-01-31 LAB — BRAIN NATRIURETIC PEPTIDE: B Natriuretic Peptide: 174 pg/mL — ABNORMAL HIGH (ref 0.0–100.0)

## 2023-01-31 LAB — MAGNESIUM: Magnesium: 2.2 mg/dL (ref 1.7–2.4)

## 2023-01-31 MED ORDER — SODIUM CHLORIDE 0.9 % IV BOLUS: 500.0000 mL | Freq: Once | INTRAVENOUS | Status: DC

## 2023-01-31 NOTE — ED Notes (Addendum)
St jude device interrogated, spoke with Arlys John at [facility], tells this RN everything "looks good" , no arrhythmias . Sinus Rhythm. Tells this RN 6 more months left on battery. Information given to ED-PA Tammy.

## 2023-01-31 NOTE — ED Provider Notes (Signed)
EMERGENCY DEPARTMENT AT [facility] Provider Note   CSN: [id number] Arrival date & time: 01/31/23  1354     History  Chief Complaint  Patient presents with   Near Syncope    JERRICO COVELLO is a 82 y.o. male.   Near Syncope Pertinent negatives include no chest pain, no abdominal pain, no headaches and no shortness of breath.        RAFFAEL BUGARIN is a 82 y.o. male with past medical tree of hypertension, hypothyroidism, type 2 diabetes, CHF, CKD, nonischemic cardiomyopathy with history of prior cardiac arrest and ICD placement who presents to the Emergency Department complaining of near syncopal episode just prior to arrival.  States that his cardiology provider has been adjusting his furosemide doses as his "fluid levels" have been high.  He took an increased furosemide from 20 to 40 mg for 3 days last week and then started and another increased dose of 40 mg today.  States that he felt well this morning upon waking ate breakfast and took his morning medications.  He was outside when he had a brief episode of feeling lightheaded as though he may pass out.  He felt his blood sugar may have been low, so he sat down and drink some soda felt somewhat improved.  He notes some swelling of his left lower leg but states he typically has some edema to both lower legs.  EMS was called and found to have low blood pressure.  Upon arrival here, patient denies any complaints, specifically no dizziness, chest pain or shortness of breath.  Has not felt any firing of his ICD    Home Medications Prior to Admission medications   Medication Sig Start Date End Date Taking? Authorizing Provider  NAPROSYN 500 MG tablet Take 500 mg by mouth 2 (two) times daily as needed. 12/12/22  Yes [provider]  aspirin EC 81 MG tablet Take 81 mg by mouth daily.    [provider]  brimonidine (ALPHAGAN) 0.2 % ophthalmic solution Place 1 drop into the left eye 3 (three) times  daily.    [provider]  carboxymethylcellulose (REFRESH PLUS) 0.5 % SOLN Place 1 drop into both eyes daily.    [provider]  carvedilol (COREG) 12.5 MG tablet TAKE 1 TABLET BY MOUTH TWICE DAILY WITH A MEAL 05/30/22   [name], Lisette Abu, MD  cephALEXin (KEFLEX) 500 MG capsule Take 1 capsule (500 mg total) by mouth 2 (two) times daily. 01/26/23   Particia Nearing, PA-C  chlorhexidine (HIBICLENS) 4 % external liquid Apply topically daily as needed. 01/26/23   Particia Nearing, PA-C  dapagliflozin propanediol (FARXIGA) 10 MG TABS tablet Take 10 mg by mouth daily.    [provider]  ENTRESTO 97-103 MG TAKE 1 TABLET BY MOUTH TWICE DAILY 08/14/22   [name], Lisette Abu, MD  furosemide (LASIX) 20 MG tablet TAKE 1 TABLET(20 MG) BY MOUTH DAILY 09/06/22   [name], Lisette Abu, MD  glucose blood test strip USE AS DIRECTED TO CHECK BLOOD SUGAR BID 01/21/18   [provider]  JANUVIA 100 MG tablet Take 100 mg by mouth daily. 04/28/22   [provider]  levothyroxine (SYNTHROID, LEVOTHROID) 75 MCG tablet Take 75 mcg by mouth every evening. Monday through Saturday    [provider]  Multiple Vitamins-Minerals (ICAPS AREDS 2) CAPS Take 1 capsule by mouth 2 (two) times daily.    [provider]  mupirocin ointment (BACTROBAN) 2 % Apply  1 Application topically 2 (two) times daily. 01/26/23   Particia Nearing, PA-C  Omega-3 Fatty Acids (OMEGA 3 PO) Take 1,600 mg by mouth in the morning and at bedtime.    [provider]  rosuvastatin (CRESTOR) 20 MG tablet Take 20 mg by mouth daily. 12/09/21   [provider]  spironolactone (ALDACTONE) 25 MG tablet TAKE 1/2 TABLET(12.5 MG) BY MOUTH DAILY 07/21/22   [name], Lisette Abu, MD      Allergies    Bee venom, Demerol [meperidine], Pioglitazone, Shellfish allergy, Sulfa antibiotics, and Shrimp extract    Review of Systems   Review of Systems  Constitutional:  Negative for appetite change,  chills and fever.  Respiratory:  Negative for cough and shortness of breath.   Cardiovascular:  Positive for near-syncope. Negative for chest pain.  Gastrointestinal:  Negative for abdominal pain, diarrhea, nausea and vomiting.  Musculoskeletal:  Negative for neck pain.  Neurological:  Positive for light-headedness. Negative for dizziness, seizures, syncope, speech difficulty, weakness and headaches.  Psychiatric/Behavioral:  Negative for confusion.     Physical Exam Updated Vital Signs BP (!) 100/50 (BP Location: Right Arm)   Pulse 72   Temp 98.1 F (36.7 C) (Oral)   Resp 16   Ht 5\' 11"  (1.803 m)   Wt 136.1 kg   SpO2 98%   BMI 41.84 kg/m  Physical Exam Vitals and nursing note reviewed.  Constitutional:      General: He is not in acute distress.    Appearance: Normal appearance. He is not ill-appearing or toxic-appearing.  HENT:     Mouth/Throat:     Mouth: Mucous membranes are moist.  Eyes:     Extraocular Movements: Extraocular movements intact.     Conjunctiva/sclera: Conjunctivae normal.     Pupils: Pupils are equal, round, and reactive to light.  Cardiovascular:     Rate and Rhythm: Normal rate and regular rhythm.     Pulses: Normal pulses.  Pulmonary:     Effort: Pulmonary effort is normal.     Breath sounds: Normal breath sounds.  Chest:     Chest wall: No tenderness.  Abdominal:     Palpations: Abdomen is soft.     Tenderness: There is no abdominal tenderness.  Musculoskeletal:     Cervical back: Normal range of motion.     Right lower leg: Edema present.     Left lower leg: Edema present.     Comments: Mild edema bilateral lower extremities with left slightly greater than right.  No posterior calf tenderness or excessive warmth or erythema  Skin:    General: Skin is warm.     Capillary Refill: Capillary refill takes less than 2 seconds.  Neurological:     General: No focal deficit present.     Mental Status: He is alert.     Sensory: No sensory deficit.      Motor: No weakness.     ED Results / Procedures / Treatments   Labs (all labs ordered are listed, but only abnormal results are displayed) Labs Reviewed  CBC WITH DIFFERENTIAL/PLATELET - Abnormal; Notable for the following components:      Result Value   RBC 3.59 (*)    Hemoglobin 11.4 (*)    HCT 36.8 (*)    MCV 102.5 (*)    Platelets 123 (*)    All other components within normal limits  BRAIN NATRIURETIC PEPTIDE - Abnormal; Notable for the following components:   B Natriuretic Peptide 174.0 (*)  All other components within normal limits  COMPREHENSIVE METABOLIC PANEL - Abnormal; Notable for the following components:   Glucose, Bld 135 (*)    BUN 28 (*)    Creatinine, Ser 1.78 (*)    Calcium 8.6 (*)    Albumin 3.4 (*)    GFR, Estimated 38 (*)    All other components within normal limits  CBG MONITORING, ED - Abnormal; Notable for the following components:   Glucose-Capillary 138 (*)    All other components within normal limits  LACTIC ACID, PLASMA  MAGNESIUM  CBG MONITORING, ED    EKG EKG Interpretation Date/Time:  Wednesday January 31 2023 14:21:49 EST Ventricular Rate:  70 PR Interval:  195 QRS Duration:  162 QT Interval:  459 QTC Calculation: 496 R Axis:   -84  Text Interpretation: Sinus rhythm IVCD, consider atypical RBBB Anterolateral infarct, recent Confirmed by Estanislado Pandy [phone]) on 01/31/2023 4:10:48 PM  Radiology DG Chest Port 1 View  Result Date: 01/31/2023 CLINICAL DATA:  Weakness. EXAM: PORTABLE CHEST 1 VIEW COMPARISON:  07/31/2018. FINDINGS: Low lung volume. There are probable atelectatic changes at the lung bases, left-greater-than-right. Bilateral lung fields are otherwise clear. No acute consolidation or lung collapse. Bilateral costophrenic angles are clear. Stable cardio-mediastinal silhouette. There is a left sided 3-lead pacemaker. No acute osseous abnormalities. The soft tissues are within normal limits. IMPRESSION: *No acute  cardiopulmonary abnormality. Electronically Signed   By: Jules Schick M.D.   On: 01/31/2023 17:04    Procedures Procedures    Medications Ordered in ED Medications - No data to display  ED Course/ Medical Decision Making/ A&P Clinical Course as of 01/31/23 1850  Wed Jan 31, 2023  1655 SpO2: 99 % [TY]    Clinical Course User Index [TY] Coral Spikes, DO                                 Medical Decision Making Patient brought in for near syncopal event earlier today.  Has been on fluid restriction for some time, has history of CHF and apically takes 20 mg Lasix daily, was advised to increase his Lasix dose to 40 mg he did this for 3 days last week and was advised to take an additional Lasix today which she did this morning.  Has had minimal fluid intake this morning was walking around outside and felt as though he was going to faint.  His symptoms slightly improved after sitting down and drinking a soda.  EMS was called and patient was found to be hypotensive.  He denied any chest pain or shortness of breath on arrival.  Has ICD but denies any firing.  Pacemaker was interrogated without any acute events.  Amount and/or Complexity of Data Reviewed Labs: ordered.    Details: Labs interpreted by me, no evidence of leukocytosis, chemistries show slightly elevated BUN at 28, serum creatinine 1.78, no recent levels available for comparison.  1.34 but this was 4 years ago.  Lactic acid unremarkable, BNP 174, magnesium unremarkable Radiology: ordered.    Details: Chest x-ray without acute findings or evidence of effusion ECG/medicine tests: ordered.    Details: EKG shows sinus rhythm with IVCD, Discussion of management or test interpretation with external provider(s): Near syncopal event felt to be due to volume depletion.  Hypotensive on arrival.  No workup findings suggestive of sepsis.  No tachycardia or hypoxia here.  I recommended small IV fluid bolus but patient  preferred oral  hydration.  He drank 2 cups of water, blood pressure has improved.  He ambulated in the department without complications, stated that he feels fine and ready to go home.  Systolic pressure consistently greater than 100.  He has good follow-up with cardiology.  I feel he is appropriate for discharge home.  I recommended that he withhold any increased Lasix dosing until reevaluated by his cardiologist.  Will follow-up with them tomorrow.  Return precautions were given.           Final Clinical Impression(s) / ED Diagnoses Final diagnoses:  Dehydration    Rx / DC Orders ED Discharge Orders     None         Pauline Aus, PA-C 01/31/23 1903    Coral Spikes, DO 02/01/23 0005

## 2023-01-31 NOTE — Discharge Instructions (Signed)
Do not take any additional Lasix until seen by your cardiologist.  Please contact them tomorrow for recheck.  Return to emergency department for any new or worsening symptoms.

## 2023-01-31 NOTE — ED Triage Notes (Addendum)
Pt to ED via EMS from home c/o near syncopal, walking outside his home, felt lightheaded, so sat down, and drank some soda. Never passed out. No LOC.   Has pacemaker,  Followed up with cards yesterday ; up lasix yesterday to 40mg  from 20mg .   Initial vs bp 54/44,   Last BP 78/52, P 68, 96%ra, cbg179   No medications given by EMS. #20LAC  A&OX 4  Pt currently voicing no complaints.

## 2023-01-31 NOTE — ED Notes (Signed)
Pt ambulatory with cane, NAD, voicing no complaints

## 2023-01-31 NOTE — Telephone Encounter (Signed)
Received call from patient and he stated he is in [facility] ED.   He had episode of feeling lightheaded this morning while he was outside working after he took 40 mg of Lasix this morning and ate a sandwich.  They are evaluating him in the ED now.   Advised to follow instructions given at at time of discharge and do not take any extra Lasix at this time.   He just wanted to let me know.

## 2023-02-05 ENCOUNTER — Ambulatory Visit: Payer: Medicare PPO | Attending: Internal Medicine

## 2023-02-05 DIAGNOSIS — Z95810 Presence of automatic (implantable) cardiac defibrillator: Secondary | ICD-10-CM

## 2023-02-05 DIAGNOSIS — E669 Obesity, unspecified: Secondary | ICD-10-CM | POA: Diagnosis not present

## 2023-02-05 DIAGNOSIS — E860 Dehydration: Secondary | ICD-10-CM | POA: Diagnosis not present

## 2023-02-05 DIAGNOSIS — I5022 Chronic systolic (congestive) heart failure: Secondary | ICD-10-CM

## 2023-02-05 DIAGNOSIS — I110 Hypertensive heart disease with heart failure: Secondary | ICD-10-CM | POA: Diagnosis not present

## 2023-02-05 DIAGNOSIS — E1169 Type 2 diabetes mellitus with other specified complication: Secondary | ICD-10-CM | POA: Diagnosis not present

## 2023-02-05 DIAGNOSIS — R55 Syncope and collapse: Secondary | ICD-10-CM | POA: Diagnosis not present

## 2023-02-05 NOTE — Progress Notes (Signed)
EPIC Encounter for ICM Monitoring  Patient Name: Rickey Rice is a 82 y.o. male Date: 02/05/2023 Primary Care Physican: Renaye Rakers, MD Primary Cardiologist: [facility] Electrophysiologist: Joycelyn Schmid Pacing:  >99%       06/15/2022 Office Weight: 307 lbs 12/13/2022 Office Weight: 305 lbs 01/29/2023 Weight: 300 lbs   Battery Longevity: 5.7 months                                         Spoke with patient and heart failure questions reviewed.  Transmission results reviewed.  Pt asymptomatic for fluid accumulation.  He had ED visit 11/6 for low BP followed by PCP OV today.  BP is back to normal.  Diet:  Discussed 11/11 to monitor salt and fluid intake.  Fluid intake should be limited to 64 oz daily.[name] Thoracic impedance suggesting fluid levels returned to normal after taking extra Furosemide.   Prescribed:  Furosemide 20 mg take 1 tablet by mouth daily.  Per 11/4 ICM note, Dr Rennis Golden advised pt should monitor and use extra lasix a directed if weight goes up or he is symptomatic or swollen.  Spironolactone 25 mg take 0.5 tablet (12.5 mg total) by mouth daily   Labs: 07/08/2019 Creatinine 1.49, BUN 21, Potassium 4.4, Sodium 141, GFR 44-51 05/27/2019 Creatinine 1.36, BUN 18, Potassium 4.5, Sodium 145, GFR 49-57 A complete set of results can be found in Results Review.   Recommendations:  Left voice mail with ICM number and encouraged to call if experiencing any fluid symptoms.   Follow-up plan: ICM clinic phone appointment on 02/26/2023.  91 day device clinic remote transmission 04/26/2023.       EP/Cardiology Office Visits:   Recall 08/02/2023 with Dr Rennis Golden.  02/27/2023 with Dr Graciela Husbands.     Copy of ICM check sent to Dr. Graciela Husbands.   3 month ICM trend: 02/05/2023.    12-14 Month ICM trend:     [name], RN 02/05/2023 4:02 PM

## 2023-02-07 NOTE — Progress Notes (Signed)
Remote ICD transmission.   

## 2023-02-26 ENCOUNTER — Ambulatory Visit: Payer: Medicare PPO | Attending: Internal Medicine

## 2023-02-26 DIAGNOSIS — Z95810 Presence of automatic (implantable) cardiac defibrillator: Secondary | ICD-10-CM

## 2023-02-26 DIAGNOSIS — I5022 Chronic systolic (congestive) heart failure: Secondary | ICD-10-CM | POA: Diagnosis not present

## 2023-02-26 NOTE — Progress Notes (Unsigned)
EPIC Encounter for ICM Monitoring  Patient Name: Rickey Rice is a 82 y.o. male Date: 02/26/2023 Primary Care Physican: Renaye Rakers, MD Primary Cardiologist: [facility] Electrophysiologist: Joycelyn Schmid Pacing:  >99%       06/15/2022 Office Weight: 307 lbs 12/13/2022 Office Weight: 305 lbs 01/29/2023 Weight: 300 lbs   Battery Longevity: 5.7 months                                         Spoke with patient and heart failure questions reviewed.  Transmission results reviewed.  Pt reports Dr Graciela Husbands checked his legs at the OV.  ED visit 11/4 with dx of dehydration, low BP after patient took Lasix 40 mg x 3 days in a week and then again for 1 day the following week.  He discussed overall health with Dr Graciela Husbands.   Dr Graciela Husbands decreased 2 of BP meds due to low BP.     [place] Thoracic impedance suggesting possible fluid accumulation starting 11/25 and starting to trend back to baseline.   Prescribed:  Furosemide 20 mg take 1 tablet by mouth daily. Spironolactone 25 mg take 0.5 tablet (12.5 mg total) by mouth daily   Labs: 01/31/2023 Creatinine 1.78, BUN 28, Potassium 4.0, Sodium 140, GFR 34 A complete set of results can be found in Results Review.   Recommendations:   Per Dr Graciela Husbands he recommended for pt to take 3 days take furosemide 40 mg with at least a day of rest in between.   Encouraged to call for any changes in condition.     Follow-up plan: ICM clinic phone appointment on 03/05/2023 to recheck fluid levels.  91 day device clinic remote transmission 04/26/2023.       EP/Cardiology Office Visits:   Recall 08/02/2023 with Dr Rennis Golden.  02/27/2023 with Dr Graciela Husbands.     Copy of ICM check sent to Dr. Graciela Husbands.   3 month ICM trend: 02/26/2023.    12-14 Month ICM trend:     [name], RN 02/26/2023 11:46 AM

## 2023-02-27 ENCOUNTER — Encounter: Payer: Self-pay | Admitting: Internal Medicine

## 2023-02-27 ENCOUNTER — Telehealth: Payer: Self-pay | Admitting: Internal Medicine

## 2023-02-27 ENCOUNTER — Ambulatory Visit: Payer: Medicare PPO | Attending: Internal Medicine | Admitting: Internal Medicine

## 2023-02-27 VITALS — BP 106/66 | HR 61 | Ht 71.5 in | Wt 301.0 lb

## 2023-02-27 DIAGNOSIS — I428 Other cardiomyopathies: Secondary | ICD-10-CM

## 2023-02-27 DIAGNOSIS — I5022 Chronic systolic (congestive) heart failure: Secondary | ICD-10-CM | POA: Diagnosis not present

## 2023-02-27 DIAGNOSIS — Z79899 Other long term (current) drug therapy: Secondary | ICD-10-CM

## 2023-02-27 DIAGNOSIS — Z95810 Presence of automatic (implantable) cardiac defibrillator: Secondary | ICD-10-CM | POA: Diagnosis not present

## 2023-02-27 DIAGNOSIS — Z6841 Body Mass Index (BMI) 40.0 and over, adult: Secondary | ICD-10-CM | POA: Diagnosis not present

## 2023-02-27 DIAGNOSIS — I472 Ventricular tachycardia, unspecified: Secondary | ICD-10-CM

## 2023-02-27 DIAGNOSIS — E1159 Type 2 diabetes mellitus with other circulatory complications: Secondary | ICD-10-CM | POA: Diagnosis not present

## 2023-02-27 DIAGNOSIS — I110 Hypertensive heart disease with heart failure: Secondary | ICD-10-CM | POA: Diagnosis not present

## 2023-02-27 MED ORDER — ENTRESTO 97-103 MG PO TABS: ORAL_TABLET | ORAL | 9 refills | Status: DC

## 2023-02-27 MED ORDER — CARVEDILOL 12.5 MG PO TABS: 6.2500 mg | ORAL_TABLET | Freq: Two times a day (BID) | ORAL | Status: DC

## 2023-02-27 NOTE — Telephone Encounter (Signed)
Dr Parke Simmers wants to speak to Dr Rennis Golden about pt's hypotension. Her mobile number is [phone] or

## 2023-02-27 NOTE — Patient Instructions (Signed)
Medication Instructions:  Your physician has recommended you make the following change in your medication:   ** Decrease Carvedilol 12.5mg  to 1/2 tablet (6.25mg ) twice daily with a meal.  ** Decrease Entresto 97-103 to 1/2 tablet by mouth twice daily  Next week increase Furosemide 20mg  to 2 tablets by mouth 3 times per week x 1 week then return to your regular dose. *If you need a refill on your cardiac medications before your next appointment, please call your pharmacy*   Lab Work: None ordered.  If you have labs (blood work) drawn today and your tests are completely normal, you will receive your results only by: MyChart Message (if you have MyChart) OR A paper copy in the mail If you have any lab test that is abnormal or we need to change your treatment, we will call you to review the results.   Testing/Procedures: None ordered.    Follow-Up: At [facility], you and your health needs are our priority.  As part of our continuing mission to provide you with exceptional heart care, we have created designated Provider Care Teams.  These Care Teams include your primary Cardiologist (physician) and Advanced Practice Providers (APPs -  Physician Assistants and Nurse Practitioners) who all work together to provide you with the care you need, when you need it.  We recommend signing up for the patient portal called "MyChart".  Sign up information is provided on this After Visit Summary.  MyChart is used to connect with patients for Virtual Visits (Telemedicine).  Patients are able to view lab/test results, encounter notes, upcoming appointments, etc.  Non-urgent messages can be sent to your provider as well.   To learn more about what you can do with MyChart, go to [email or web address].    Your next appointment:   12 months with Dr Graciela Husbands

## 2023-02-27 NOTE — Progress Notes (Signed)
Error      Patient Care Team: Renaye Rakers, MD as PCP - General (Family Medicine)   HPI  Rickey Rice is a 82 y.o. male Seen in follow-up for CRT-D implantation for aborted cardiac arrest in the setting of nonischemic cardiac myopathy and left bundle branch block.  Because of a short RV LV interval, it was ELECTED NOT to activate the LV leads  initially. We subsequently have  turned on the LV lead     Followed by ICM clinic.  Was in the ER a few weeks ago with hypotension blood pressure was 100; diagnosis dehydration fluid repletion and discharged.  Lightheadedness with standing.  Chronic shortness of breath.  No chest pain.  Significant edema is working with [facility] clinic    Date Cr K Hgb  11/17  1.24 4.2   5/22  1.16 4.1 11.4  7/23 1.4 4.5 12.4  11/24 1.78 4.0 11.4    DATE TEST EF   3/16 LHC  No obstructive CAD  3/16 Echo  40 %   8/17 Echo  30-35 %   2/21 Echo  34-40%   3/23 Echo  40-45%          Has sleep disordered breathing now on CPAP  Past Medical History:  Diagnosis Date   AICD (automatic cardioverter/defibrillator) present    Arthritis    oa   Cardiac arrest (HCC) 06/07/2014   Primary VF arrest with successful resuscitation and s/p ICD implant   Diabetes mellitus without complication (HCC)    type 2   Dyslipidemia    History of nuclear stress test 04/2012   lexiscan - 2 day protocol; low risk study, evidence of inferrior & apical scar but no ischemia    Hypertension    Hypothyroidism    LBBB (left bundle branch block)    Morbid obesity (HCC)    NICM (nonischemic cardiomyopathy) (HCC)    Prostate enlargement    pt denies    Past Surgical History:  Procedure Laterality Date   BI-VENTRICULAR IMPLANTABLE CARDIOVERTER DEFIBRILLATOR N/A 06/18/2014   STJ CRTD implanted by Dr Graciela Husbands   COLONOSCOPY W/ BIOPSIES     COLONOSCOPY WITH PROPOFOL N/A 04/05/2017   Procedure: COLONOSCOPY WITH PROPOFOL;  Surgeon: Charna Elizabeth, MD;  Location: [facility];  Service:  Endoscopy;  Laterality: N/A;   FINGER SURGERY  1954   1 st joint  right hand amputated   KNEE ARTHROSCOPY Left    LEFT HEART CATHETERIZATION WITH CORONARY ANGIOGRAM N/A 06/12/2014   Procedure: LEFT HEART CATHETERIZATION WITH CORONARY ANGIOGRAM;  Surgeon: Marykay Lex, MD;  Location: [facility] CATH LAB;  Service: Cardiovascular;  Laterality: N/A;   MOUTH SURGERY  1963   TOTAL KNEE ARTHROPLASTY Right 02/05/2013   Procedure: TOTAL KNEE ARTHROPLASTY;  Surgeon: Nestor Lewandowsky, MD;  Location: [facility] OR;  Service: Orthopedics;  Laterality: Right;   TOTAL KNEE ARTHROPLASTY Left 01/19/2014   Procedure: LEFT TOTAL KNEE ARTHROPLASTY;  Surgeon: Nestor Lewandowsky, MD;  Location: [facility] OR;  Service: Orthopedics;  Laterality: Left;    Current Outpatient Medications:    aspirin EC 81 MG tablet, Take 81 mg by mouth daily., Disp: , Rfl:    brimonidine (ALPHAGAN) 0.2 % ophthalmic solution, Place 1 drop into the left eye 3 (three) times daily., Disp: , Rfl:    carboxymethylcellulose (REFRESH PLUS) 0.5 % SOLN, Place 1 drop into the right eye daily., Disp: , Rfl:    carvedilol (COREG) 12.5 MG tablet, TAKE 1 TABLET BY MOUTH TWICE DAILY  WITH A MEAL, Disp: 180 tablet, Rfl: 3   dapagliflozin propanediol (FARXIGA) 10 MG TABS tablet, Take 10 mg by mouth daily., Disp: , Rfl:    ENTRESTO 97-103 MG, TAKE 1 TABLET BY MOUTH TWICE DAILY, Disp: 60 tablet, Rfl: 9   furosemide (LASIX) 20 MG tablet, TAKE 1 TABLET(20 MG) BY MOUTH DAILY, Disp: 30 tablet, Rfl: 6   glucose blood test strip, USE AS DIRECTED TO CHECK BLOOD SUGAR BID, Disp: , Rfl:    JANUVIA 100 MG tablet, Take 100 mg by mouth daily., Disp: , Rfl:    levothyroxine (SYNTHROID, LEVOTHROID) 75 MCG tablet, Take 75 mcg by mouth every evening. Monday through Saturday, Disp: , Rfl:    Multiple Vitamins-Minerals (ICAPS AREDS 2) CAPS, Take 1 capsule by mouth 2 (two) times daily., Disp: , Rfl:    mupirocin ointment (BACTROBAN) 2 %, Apply 1 Application topically 2 (two) times daily. (Patient  taking differently: Apply 1 Application topically as needed.), Disp: 22 g, Rfl: 0   NAPROSYN 500 MG tablet, Take 500 mg by mouth 2 (two) times daily as needed., Disp: , Rfl:    Omega-3 Fatty Acids (OMEGA 3 PO), Take 1,600 mg by mouth in the morning and at bedtime., Disp: , Rfl:    rosuvastatin (CRESTOR) 20 MG tablet, Take 20 mg by mouth daily., Disp: , Rfl:    spironolactone (ALDACTONE) 25 MG tablet, TAKE 1/2 TABLET(12.5 MG) BY MOUTH DAILY, Disp: 45 tablet, Rfl: 3 Discussed patient MONITOR   Allergies  Allergen Reactions   Bee Venom Swelling    Swelling on lips and tongue   Demerol [Meperidine]     Not sure   Pioglitazone     Lips swelling= actos   Shellfish Allergy     rash Other reaction(s): Unknown   Sulfa Antibiotics     Not sure   Shrimp Extract Itching    Rash      Review of Systems negative except from HPI and PMH  Physical Exam BP 106/66   Pulse 61   Ht 5' 11.5" (1.816 m)   Wt (!) 301 lb (136.5 kg)   BMI 41.40 kg/m  Well developed and well nourished in no acute distress HENT normal Neck supple with JVP-7 Clear Device pocket well healed; without hematoma or erythema.  There is no tethering  Regular rate and rhythm, no  gallop No / murmur Abd-soft with active BS No Clubbing cyanosis 2+ edema Skin-warm and dry A & Oriented  Grossly normal sensory and motor function  ECG sinus  P-synchronous/ AV  pacing  19/15/47  Device function is normal. Programming changes increased LV output  See Paceart for details    ECG sinus with P synchronous pacing at 57 Q-wave lead V1 and RS in lead V1  ECG #1 demonstrated left bundle branch block. It turned out this was associated with sub threshold left ventricular pacing.   ECG #2 with of increased output on the LV lead demonstrated a negative QRS in lead 1 and a biphasic QRS in lead V1   ECG #3 to demonstrated negative QRS in lead 1 and upright QRS in lead V1  ECG 6/19 sinus P synchronous pacing at 65 Interval  16/18/47 Negative QRS lead I and upright QRS lead V1   Assessment and  Plan  NICM  Aborted cardiac Arrest  VT-polymorphic /syncope   CRT-D implant  Hypertension  CHF chronic-systolic/diastolic    Patient has had some hypotension.  Will decrease his Entresto from 97/103--49/51 his carvedilol from 12.5--6.25  with a goal of a systolic blood pressure about 1 20-1 30 and relief of symptoms.  He is volume overloaded.  Next week we will have him on 3 days take furosemide 40 mg with at least a day of rest in between  No interval ventricular arrhythmias; continue carvedilol

## 2023-02-28 NOTE — Telephone Encounter (Signed)
Per chart review, patient saw Dr. Graciela Husbands yesterday 02/27/23 and meds were adjusted d/t low BP

## 2023-03-05 ENCOUNTER — Ambulatory Visit: Payer: Medicare PPO | Attending: Internal Medicine

## 2023-03-05 DIAGNOSIS — Z95810 Presence of automatic (implantable) cardiac defibrillator: Secondary | ICD-10-CM

## 2023-03-05 DIAGNOSIS — I5022 Chronic systolic (congestive) heart failure: Secondary | ICD-10-CM

## 2023-03-05 NOTE — Progress Notes (Signed)
EPIC Encounter for ICM Monitoring  Patient Name: Rickey Rice is a 82 y.o. male Date: 03/05/2023 Primary Care Physican: Renaye Rakers, MD Primary Cardiologist: [facility] Electrophysiologist: Joycelyn Schmid Pacing:  >99%       06/15/2022 Office Weight: 307 lbs 12/13/2022 Office Weight: 305 lbs 01/29/2023 Weight: 300 lbs   Battery Longevity: 3.8 months                                         Spoke with patient and heart failure questions reviewed.  Transmission results reviewed.  Pt reports no further leg swelling.    Corvue Thoracic impedance suggesting fluid levels returned to normal before starting Dr Koren Bound recommendation to take 3 days of Furosemide 40 mg every other day since taking Furosemide 40 mg x 3 consecutive days lowered BP.   Prescribed:  Furosemide 20 mg take 1 tablet by mouth daily. Spironolactone 25 mg take 0.5 tablet (12.5 mg total) by mouth daily   Labs: 01/31/2023 Creatinine 1.78, BUN 28, Potassium 4.0, Sodium 140, GFR 34 A complete set of results can be found in Results Review.   Recommendations:   Encouraged to call for any changes in condition.     Follow-up plan: ICM clinic phone appointment on 04/02/2023.  91 day device clinic remote transmission 04/26/2023.       EP/Cardiology Office Visits:   Recall 08/02/2023 with Dr Rennis Golden.  02/27/2023 with Dr Graciela Husbands.     Copy of ICM check sent to Dr. Graciela Husbands.   3 month ICM trend: 03/05/2023.    12-14 Month ICM trend:     [name], RN 03/05/2023 1:40 PM

## 2023-03-13 ENCOUNTER — Encounter: Payer: Self-pay | Admitting: Internal Medicine

## 2023-03-29 ENCOUNTER — Ambulatory Visit (INDEPENDENT_AMBULATORY_CARE_PROVIDER_SITE_OTHER): Payer: Medicare PPO

## 2023-03-29 DIAGNOSIS — I428 Other cardiomyopathies: Secondary | ICD-10-CM

## 2023-03-29 LAB — CUP PACEART REMOTE DEVICE CHECK
Battery Remaining Longevity: 4 mo
Battery Remaining Percentage: 4 %
Battery Voltage: 2.62 V
Brady Statistic AP VP Percent: 1 %
Brady Statistic AP VS Percent: 1 %
Brady Statistic AS VP Percent: 99 %
Brady Statistic AS VS Percent: 1 %
Brady Statistic RA Percent Paced: 1 %
Date Time Interrogation Session: 20250102020016
HighPow Impedance: 64 Ohm
HighPow Impedance: 64 Ohm
Implantable Lead Connection Status: 753985
Implantable Lead Connection Status: 753985
Implantable Lead Connection Status: 753985
Implantable Lead Implant Date: 20160324
Implantable Lead Implant Date: 20160324
Implantable Lead Implant Date: 20160324
Implantable Lead Location: 753858
Implantable Lead Location: 753859
Implantable Lead Location: 753860
Implantable Lead Model: 7122
Implantable Pulse Generator Implant Date: 20160324
Lead Channel Impedance Value: 390 Ohm
Lead Channel Impedance Value: 490 Ohm
Lead Channel Impedance Value: 550 Ohm
Lead Channel Pacing Threshold Amplitude: 1 V
Lead Channel Pacing Threshold Amplitude: 1.25 V
Lead Channel Pacing Threshold Amplitude: 2 V
Lead Channel Pacing Threshold Pulse Width: 0.5 ms
Lead Channel Pacing Threshold Pulse Width: 0.5 ms
Lead Channel Pacing Threshold Pulse Width: 0.5 ms
Lead Channel Sensing Intrinsic Amplitude: 12 mV
Lead Channel Sensing Intrinsic Amplitude: 2.2 mV
Lead Channel Setting Pacing Amplitude: 2 V
Lead Channel Setting Pacing Amplitude: 2.5 V
Lead Channel Setting Pacing Amplitude: 3 V
Lead Channel Setting Pacing Pulse Width: 0.5 ms
Lead Channel Setting Pacing Pulse Width: 0.5 ms
Lead Channel Setting Sensing Sensitivity: 0.5 mV
Pulse Gen Serial Number: 7240350
Zone Setting Status: 755011

## 2023-04-02 ENCOUNTER — Ambulatory Visit: Payer: Medicare PPO | Attending: Internal Medicine

## 2023-04-02 DIAGNOSIS — I5022 Chronic systolic (congestive) heart failure: Secondary | ICD-10-CM | POA: Diagnosis not present

## 2023-04-02 DIAGNOSIS — E039 Hypothyroidism, unspecified: Secondary | ICD-10-CM | POA: Diagnosis not present

## 2023-04-02 DIAGNOSIS — I959 Hypotension, unspecified: Secondary | ICD-10-CM | POA: Diagnosis not present

## 2023-04-02 DIAGNOSIS — Z95810 Presence of automatic (implantable) cardiac defibrillator: Secondary | ICD-10-CM | POA: Diagnosis not present

## 2023-04-02 DIAGNOSIS — E662 Morbid (severe) obesity with alveolar hypoventilation: Secondary | ICD-10-CM | POA: Diagnosis not present

## 2023-04-02 DIAGNOSIS — I429 Cardiomyopathy, unspecified: Secondary | ICD-10-CM | POA: Diagnosis not present

## 2023-04-04 NOTE — Progress Notes (Signed)
 EPIC Encounter for ICM Monitoring  Patient Name: Rickey Rice is a 83 y.o. male Date: 04/04/2023 Primary Care Physican: Benjamine Aland, MD Primary Cardiologist: [facility] Electrophysiologist: Fernande Pore Pacing:  >99%       06/15/2022 Office Weight: 307 lbs 12/13/2022 Office Weight: 305 lbs 01/29/2023 Weight: 300 lbs   Battery Longevity: 3.6 months                                         Spoke with patient and heart failure questions reviewed.  Transmission results reviewed.  Pt reports no further leg swelling.    Corvue Thoracic impedance suggesting normal fluid levels within the last month.   Prescribed:  Furosemide  20 mg take 1 tablet by mouth daily. Spironolactone  25 mg take 0.5 tablet (12.5 mg total) by mouth daily   Labs: 01/31/2023 Creatinine 1.78, BUN 28, Potassium 4.0, Sodium 140, GFR 34 A complete set of results can be found in Results Review.   Recommendations:   Encouraged to call for any changes in condition.     Follow-up plan: ICM clinic phone appointment on 05/07/2023.  91 day device clinic remote transmission 04/26/2023.       EP/Cardiology Office Visits:   Recall 08/02/2023 with Dr Mona.     Copy of ICM check sent to Dr. Fernande.   3 month ICM trend: 03/31/2023.    Mitzie GORMAN Garner, RN 04/04/2023 1:19 PM

## 2023-04-10 ENCOUNTER — Telehealth: Payer: Self-pay | Admitting: Internal Medicine

## 2023-04-10 ENCOUNTER — Other Ambulatory Visit: Payer: Self-pay

## 2023-04-10 MED ORDER — FUROSEMIDE 20 MG PO TABS: 20.0000 mg | ORAL_TABLET | Freq: Every day | ORAL | 3 refills | Status: DC

## 2023-04-10 NOTE — Telephone Encounter (Signed)
*  STAT* If patient is at the pharmacy, call can be transferred to refill team.   1. Which medications need to be refilled? (please list name of each medication and dose if known)  furosemide  (LASIX ) 20 MG tablet  2. Which pharmacy/location (including street and city if local pharmacy) is medication to be sent to? [id number] - [place], [place] - [address] Phone: [phone]  Fax: [phone]     3. Do they need a 30 day or 90 day supply? 30

## 2023-04-25 ENCOUNTER — Encounter: Payer: Self-pay | Admitting: Internal Medicine

## 2023-04-30 ENCOUNTER — Ambulatory Visit: Payer: Medicare PPO

## 2023-04-30 DIAGNOSIS — I5022 Chronic systolic (congestive) heart failure: Secondary | ICD-10-CM

## 2023-04-30 DIAGNOSIS — I428 Other cardiomyopathies: Secondary | ICD-10-CM

## 2023-04-30 LAB — CUP PACEART REMOTE DEVICE CHECK
Battery Remaining Longevity: 1 mo
Battery Remaining Percentage: 3 %
Battery Voltage: 2.6 V
Brady Statistic AP VP Percent: 1 %
Brady Statistic AP VS Percent: 1 %
Brady Statistic AS VP Percent: 99 %
Brady Statistic AS VS Percent: 1 %
Brady Statistic RA Percent Paced: 1 %
Date Time Interrogation Session: 20250130020016
HighPow Impedance: 62 Ohm
HighPow Impedance: 62 Ohm
Implantable Lead Connection Status: 753985
Implantable Lead Connection Status: 753985
Implantable Lead Connection Status: 753985
Implantable Lead Implant Date: 20160324
Implantable Lead Implant Date: 20160324
Implantable Lead Implant Date: 20160324
Implantable Lead Location: 753858
Implantable Lead Location: 753859
Implantable Lead Location: 753860
Implantable Lead Model: 7122
Implantable Pulse Generator Implant Date: 20160324
Lead Channel Impedance Value: 390 Ohm
Lead Channel Impedance Value: 480 Ohm
Lead Channel Impedance Value: 510 Ohm
Lead Channel Pacing Threshold Amplitude: 1 V
Lead Channel Pacing Threshold Amplitude: 1.25 V
Lead Channel Pacing Threshold Amplitude: 2 V
Lead Channel Pacing Threshold Pulse Width: 0.5 ms
Lead Channel Pacing Threshold Pulse Width: 0.5 ms
Lead Channel Pacing Threshold Pulse Width: 0.5 ms
Lead Channel Sensing Intrinsic Amplitude: 12 mV
Lead Channel Sensing Intrinsic Amplitude: 2.6 mV
Lead Channel Setting Pacing Amplitude: 2 V
Lead Channel Setting Pacing Amplitude: 2.5 V
Lead Channel Setting Pacing Amplitude: 3 V
Lead Channel Setting Pacing Pulse Width: 0.5 ms
Lead Channel Setting Pacing Pulse Width: 0.5 ms
Lead Channel Setting Sensing Sensitivity: 0.5 mV
Pulse Gen Serial Number: 7240350
Zone Setting Status: 755011

## 2023-05-07 ENCOUNTER — Ambulatory Visit: Payer: Medicare PPO | Attending: Internal Medicine

## 2023-05-07 DIAGNOSIS — Z95810 Presence of automatic (implantable) cardiac defibrillator: Secondary | ICD-10-CM | POA: Diagnosis not present

## 2023-05-07 DIAGNOSIS — I5022 Chronic systolic (congestive) heart failure: Secondary | ICD-10-CM

## 2023-05-08 DIAGNOSIS — H401222 Low-tension glaucoma, left eye, moderate stage: Secondary | ICD-10-CM | POA: Diagnosis not present

## 2023-05-08 DIAGNOSIS — H04123 Dry eye syndrome of bilateral lacrimal glands: Secondary | ICD-10-CM | POA: Diagnosis not present

## 2023-05-08 DIAGNOSIS — H0102B Squamous blepharitis left eye, upper and lower eyelids: Secondary | ICD-10-CM | POA: Diagnosis not present

## 2023-05-08 DIAGNOSIS — Z961 Presence of intraocular lens: Secondary | ICD-10-CM | POA: Diagnosis not present

## 2023-05-08 DIAGNOSIS — H401211 Low-tension glaucoma, right eye, mild stage: Secondary | ICD-10-CM | POA: Diagnosis not present

## 2023-05-08 DIAGNOSIS — H0102A Squamous blepharitis right eye, upper and lower eyelids: Secondary | ICD-10-CM | POA: Diagnosis not present

## 2023-05-09 ENCOUNTER — Telehealth: Payer: Self-pay

## 2023-05-09 NOTE — Progress Notes (Signed)
EPIC Encounter for ICM Monitoring  Patient Name: Rickey Rice is a 83 y.o. male Date: 05/09/2023 Primary Care Physican: Renaye Rakers, MD Primary Cardiologist: [facility] Electrophysiologist: Joycelyn Schmid Pacing:  >99%       06/15/2022 Office Weight: 307 lbs 12/13/2022 Office Weight: 305 lbs 01/29/2023 Weight: 300 lbs 02/27/2023 Office Weight: 301 lbs   Battery Longevity: <3 months                                         Attempted call to patient and unable to reach.  Left detailed message per DPR regarding transmission.  Transmission results reviewed.    Corvue Thoracic impedance suggesting normal fluid levels with the exception of possible fluid accumulation from 1/19-1/26.   Prescribed:  Furosemide 20 mg take 1 tablet by mouth daily. Spironolactone 25 mg take 0.5 tablet (12.5 mg total) by mouth daily   Labs: 01/31/2023 Creatinine 1.78, BUN 28, Potassium 4.0, Sodium 140, GFR 34 A complete set of results can be found in Results Review.   Recommendations:   Left voice mail with ICM number and encouraged to call if experiencing any fluid symptoms.   Follow-up plan: ICM clinic phone appointment on 06/11/2023.  91 day device clinic remote transmission 07/26/2023.       EP/Cardiology Office Visits:   Recall 08/02/2023 with Dr Rennis Golden.     Copy of ICM check sent to Dr. Graciela Husbands.   3 month ICM trend: 05/07/2023.    12-14 Month ICM trend:     [name], RN 05/09/2023 3:30 PM

## 2023-05-09 NOTE — Telephone Encounter (Signed)
Remote ICM transmission received.  Attempted call to patient regarding ICM remote transmission and left detailed message per DPR.  Left ICM phone number and advised to return call for any fluid symptoms or questions. Next ICM remote transmission scheduled 06/11/2023.

## 2023-05-09 NOTE — Progress Notes (Signed)
Remote ICD transmission.

## 2023-05-11 ENCOUNTER — Other Ambulatory Visit: Payer: Self-pay

## 2023-05-11 MED ORDER — CARVEDILOL 12.5 MG PO TABS: 6.2500 mg | ORAL_TABLET | Freq: Two times a day (BID) | ORAL | 3 refills | Status: AC

## 2023-05-17 ENCOUNTER — Telehealth: Payer: Self-pay

## 2023-05-17 NOTE — Telephone Encounter (Signed)
Alert received from [employer] for device reached the elective replacement indicator 05/16/2023.  Patient called advised of findings and that an EP scheduler will call him for an apt. Pt voiced understanding.

## 2023-05-28 ENCOUNTER — Encounter: Payer: Self-pay | Admitting: Internal Medicine

## 2023-05-28 ENCOUNTER — Ambulatory Visit: Payer: Medicare PPO

## 2023-05-28 DIAGNOSIS — I428 Other cardiomyopathies: Secondary | ICD-10-CM

## 2023-05-30 LAB — CUP PACEART REMOTE DEVICE CHECK
Battery Remaining Longevity: 0 mo
Battery Voltage: 2.59 V
Brady Statistic AP VP Percent: 1 %
Brady Statistic AP VS Percent: 1 %
Brady Statistic AS VP Percent: 99 %
Brady Statistic AS VS Percent: 1 %
Brady Statistic RA Percent Paced: 1 %
Date Time Interrogation Session: 20250304095721
HighPow Impedance: 71 Ohm
HighPow Impedance: 71 Ohm
Implantable Lead Connection Status: 753985
Implantable Lead Connection Status: 753985
Implantable Lead Connection Status: 753985
Implantable Lead Implant Date: 20160324
Implantable Lead Implant Date: 20160324
Implantable Lead Implant Date: 20160324
Implantable Lead Location: 753858
Implantable Lead Location: 753859
Implantable Lead Location: 753860
Implantable Lead Model: 7122
Implantable Pulse Generator Implant Date: 20160324
Lead Channel Impedance Value: 390 Ohm
Lead Channel Impedance Value: 460 Ohm
Lead Channel Impedance Value: 540 Ohm
Lead Channel Pacing Threshold Amplitude: 1 V
Lead Channel Pacing Threshold Amplitude: 1.25 V
Lead Channel Pacing Threshold Amplitude: 2 V
Lead Channel Pacing Threshold Pulse Width: 0.5 ms
Lead Channel Pacing Threshold Pulse Width: 0.5 ms
Lead Channel Pacing Threshold Pulse Width: 0.5 ms
Lead Channel Sensing Intrinsic Amplitude: 12 mV
Lead Channel Sensing Intrinsic Amplitude: 2.1 mV
Lead Channel Setting Pacing Amplitude: 2 V
Lead Channel Setting Pacing Amplitude: 2.5 V
Lead Channel Setting Pacing Amplitude: 3 V
Lead Channel Setting Pacing Pulse Width: 0.5 ms
Lead Channel Setting Pacing Pulse Width: 0.5 ms
Lead Channel Setting Sensing Sensitivity: 0.5 mV
Pulse Gen Serial Number: 7240350
Zone Setting Status: 755011

## 2023-05-31 DIAGNOSIS — I10 Essential (primary) hypertension: Secondary | ICD-10-CM | POA: Diagnosis not present

## 2023-05-31 DIAGNOSIS — E1169 Type 2 diabetes mellitus with other specified complication: Secondary | ICD-10-CM | POA: Diagnosis not present

## 2023-05-31 DIAGNOSIS — W19XXXA Unspecified fall, initial encounter: Secondary | ICD-10-CM | POA: Diagnosis not present

## 2023-05-31 DIAGNOSIS — R413 Other amnesia: Secondary | ICD-10-CM | POA: Diagnosis not present

## 2023-06-01 ENCOUNTER — Encounter: Payer: Self-pay | Admitting: Pulmonary Disease

## 2023-06-01 ENCOUNTER — Ambulatory Visit: Payer: Medicare PPO | Attending: Pulmonary Disease | Admitting: Pulmonary Disease

## 2023-06-01 VITALS — BP 124/68 | HR 65 | Ht 71.5 in | Wt 304.4 lb

## 2023-06-01 DIAGNOSIS — I10 Essential (primary) hypertension: Secondary | ICD-10-CM | POA: Diagnosis not present

## 2023-06-01 DIAGNOSIS — I472 Ventricular tachycardia, unspecified: Secondary | ICD-10-CM | POA: Diagnosis not present

## 2023-06-01 DIAGNOSIS — Z95810 Presence of automatic (implantable) cardiac defibrillator: Secondary | ICD-10-CM | POA: Diagnosis not present

## 2023-06-01 DIAGNOSIS — I428 Other cardiomyopathies: Secondary | ICD-10-CM | POA: Diagnosis not present

## 2023-06-01 DIAGNOSIS — I5022 Chronic systolic (congestive) heart failure: Secondary | ICD-10-CM | POA: Diagnosis not present

## 2023-06-01 NOTE — Patient Instructions (Addendum)
 Medication Instructions:  No changes *If you need a refill on your cardiac medications before your next appointment, please call your pharmacy*   Lab Work: CBC, BMET : Week of March 17 th If you have labs (blood work) drawn today and your tests are completely normal, you will receive your results only by: MyChart Message (if you have MyChart) OR A paper copy in the mail If you have any lab test that is abnormal or we need to change your treatment, we will call you to review the results.   Testing/Procedures: No testing   Follow-Up: At [facility], you and your health needs are our priority.  As part of our continuing mission to provide you with exceptional heart care, we have created designated Provider Care Teams.  These Care Teams include your primary Cardiologist (physician) and Advanced Practice Providers (APPs -  Physician Assistants and Nurse Practitioners) who all work together to provide you with the care you need, when you need it.  We recommend signing up for the patient portal called "MyChart".  Sign up information is provided on this After Visit Summary.  MyChart is used to connect with patients for Virtual Visits (Telemedicine).  Patients are able to view lab/test results, encounter notes, upcoming appointments, etc.  Non-urgent messages can be sent to your provider as well.   To learn more about what you can do with MyChart, go to [email or web address].    Your next appointment:   To Be Determined  Provider:    Other Instructions   1st Floor: - Lobby - Registration  - Pharmacy  - Lab - Cafe  2nd Floor: - PV Lab - Diagnostic Testing (echo, CT, nuclear med)  3rd Floor: - Vacant  4th Floor: - TCTS (cardiothoracic surgery) - AFib Clinic - Structural Heart Clinic - Vascular Surgery  - Vascular Ultrasound  5th Floor: - HeartCare Cardiology (general and EP) - Clinical Pharmacy for coumadin, hypertension, lipid, weight-loss medications, and med  management appointments    Valet parking services will be available as well.

## 2023-06-01 NOTE — Progress Notes (Signed)
  Electrophysiology Office Note:   Date:  06/01/2023  ID:  Rickey Rice, DOB [birth year], MRN [id number]  Primary Cardiologist: Chrystie Nose, MD Primary Heart Failure: None Electrophysiologist: Sherryl Manges, MD       History of Present Illness:   Rickey Rice is a 83 y.o. male with h/o NICM, LBBB, VF arrest s/p CRT-D, HTN, HLD, morbid obesity, CKD II, hypothyroidism, DM II & OSA seen today for routine electrophysiology followup.   Device Clinic was notified that patient is at [facility] as of 05/16/23.    Since last being seen in our clinic the patient reports he recently fell and is working on PT.  He is accompanied by his wife.  She asks about how long the battery will last now that it is at [facility].  No device related concerns.   He denies chest pain, palpitations, dyspnea, PND, orthopnea, nausea, vomiting, dizziness, syncope, edema, weight gain, or early satiety.   Review of systems complete and found to be negative unless listed in HPI.   EP Information / Studies Reviewed:    EKG is not ordered today. EKG from 02/27/23 reviewed which showed ASVP 61 bpm      ICD Interrogation-  reviewed in detail today,  See PACEART report.  Device History: Abbott BiV ICD implanted 06/18/14 for secondary prevention History of appropriate therapy: Yes History of AAD therapy: No   Studies:  LHC 2016 > no obstructive CAD  ECHO 05/2021 > LVEF 40-455   Arrhythmia / AAD VT     Risk Assessment/Calculations:              Physical Exam:   VS:  BP 124/68   Pulse 65   Ht 5' 11.5" (1.816 m)   Wt (!) 304 lb 6.4 oz (138.1 kg)   SpO2 98%   BMI 41.86 kg/m    Wt Readings from Last 3 Encounters:  06/01/23 (!) 304 lb 6.4 oz (138.1 kg)  02/27/23 (!) 301 lb (136.5 kg)  01/31/23 300 lb (136.1 kg)     GEN: Well nourished, well developed in no acute distress NECK: No JVD; No carotid bruits CARDIAC: Regular rate and rhythm, no murmurs, rubs, gallops RESPIRATORY:  Clear to auscultation without rales,  wheezing or rhonchi  ABDOMEN: Soft, non-tender, non-distended EXTREMITIES:  No edema; No deformity   ASSESSMENT AND PLAN:    Chronic Systolic Dysfunction due to NICM s/p Abbott CRT-D  VT / NSVT  LBBB -Euvolemic today -Stable on an appropriate medical regimen -Normal ICD function -See Pace Art report -No changes today -device is at [facility], plan for generator change and notify him of date.  Instructions reviewed with patient and soap provided for procedure. Med hold for farxiga discussed as well.   Hypertension  -well controlled on current regimen    OSA  -CPAP compliance encouraged   Disposition:   Follow up with Dr. Graciela Husbands  as planned on 07/06/23 for generator change    Signed, Canary Brim, NP-C, AGACNP-BC [facility] - Electrophysiology  06/01/2023, 6:04 PM

## 2023-06-04 ENCOUNTER — Other Ambulatory Visit: Payer: Self-pay

## 2023-06-04 MED ORDER — SPIRONOLACTONE 25 MG PO TABS: ORAL_TABLET | ORAL | 1 refills | Status: DC

## 2023-06-05 NOTE — Progress Notes (Signed)
 Remote ICD transmission.

## 2023-06-11 ENCOUNTER — Ambulatory Visit: Payer: Medicare PPO | Attending: Internal Medicine

## 2023-06-11 DIAGNOSIS — Z95810 Presence of automatic (implantable) cardiac defibrillator: Secondary | ICD-10-CM

## 2023-06-11 DIAGNOSIS — I5022 Chronic systolic (congestive) heart failure: Secondary | ICD-10-CM

## 2023-06-12 ENCOUNTER — Encounter: Payer: Self-pay | Admitting: Internal Medicine

## 2023-06-12 DIAGNOSIS — Z95810 Presence of automatic (implantable) cardiac defibrillator: Secondary | ICD-10-CM | POA: Diagnosis not present

## 2023-06-12 DIAGNOSIS — I5022 Chronic systolic (congestive) heart failure: Secondary | ICD-10-CM | POA: Diagnosis not present

## 2023-06-12 DIAGNOSIS — I428 Other cardiomyopathies: Secondary | ICD-10-CM | POA: Diagnosis not present

## 2023-06-12 DIAGNOSIS — I10 Essential (primary) hypertension: Secondary | ICD-10-CM | POA: Diagnosis not present

## 2023-06-12 DIAGNOSIS — I472 Ventricular tachycardia, unspecified: Secondary | ICD-10-CM | POA: Diagnosis not present

## 2023-06-12 LAB — CBC

## 2023-06-12 NOTE — Telephone Encounter (Signed)
 Generator change has been scheduled for 07/06/23.

## 2023-06-12 NOTE — Progress Notes (Signed)
 EPIC Encounter for ICM Monitoring  Patient Name: Rickey Rice is a 83 y.o. male Date: 06/12/2023 Primary Care Physican: Renaye Rakers, MD Primary Cardiologist: [facility] Electrophysiologist: Joycelyn Schmid Pacing:  99%       01/29/2023 Weight: 300 lbs 02/27/2023 Office Weight: 301 lbs 06/01/2022 Office Weight: 304 lbs   Battery Longevity: reached ERI and scheduled for replacement 4/11                                         Spoke with patient and heart failure questions reviewed.  Transmission results reviewed.  Pt asymptomatic for fluid accumulation.  Reports feeling well at this time and voices no complaints.     Corvue Thoracic impedance suggesting normal fluid levels within the last month.   Prescribed:  Furosemide 20 mg take 1 tablet by mouth daily. Spironolactone 25 mg take 0.5 tablet (12.5 mg total) by mouth daily   Labs: 01/31/2023 Creatinine 1.78, BUN 28, Potassium 4.0, Sodium 140, GFR 34 A complete set of results can be found in Results Review.   Recommendations:  No changes and encouraged to call if experiencing any fluid symptoms.   Follow-up plan: ICM clinic phone appointment on 06/27/2023 to recheck fluid levels before device replacement on 4/11.  91 day device clinic remote transmission 07/26/2023.       EP/Cardiology Office Visits:   Recall 08/02/2023 with Dr Rennis Golden.     Copy of ICM check sent to Dr. Graciela Husbands.   3 month ICM trend: 06/11/2023.    12-14 Month ICM trend:     [name], RN 06/12/2023 12:17 PM

## 2023-06-13 ENCOUNTER — Telehealth (HOSPITAL_COMMUNITY): Payer: Self-pay

## 2023-06-13 LAB — BASIC METABOLIC PANEL
BUN/Creatinine Ratio: 13 (ref 10–24)
BUN: 21 mg/dL (ref 8–27)
CO2: 25 mmol/L (ref 20–29)
Calcium: 9.4 mg/dL (ref 8.6–10.2)
Chloride: 104 mmol/L (ref 96–106)
Creatinine, Ser: 1.59 mg/dL — ABNORMAL HIGH (ref 0.76–1.27)
Glucose: 113 mg/dL — ABNORMAL HIGH (ref 70–99)
Potassium: 5 mmol/L (ref 3.5–5.2)
Sodium: 144 mmol/L (ref 134–144)
eGFR: 43 mL/min/{1.73_m2} — ABNORMAL LOW (ref >59)

## 2023-06-13 LAB — CBC
Hematocrit: 37.8 % (ref 37.5–51.0)
Hemoglobin: 12.6 g/dL — ABNORMAL LOW (ref 13.0–17.7)
MCH: 32.9 pg (ref 26.6–33.0)
MCHC: 33.3 g/dL (ref 31.5–35.7)
MCV: 99 fL — ABNORMAL HIGH (ref 79–97)
Platelets: 163 10*3/uL (ref 150–450)
RBC: 3.83 x10E6/uL — ABNORMAL LOW (ref 4.14–5.80)
RDW: 12.7 % (ref 11.6–15.4)
WBC: 4.8 10*3/uL (ref 3.4–10.8)

## 2023-06-13 NOTE — Telephone Encounter (Signed)
 Spoke with patient to complete one month pre-procedure call.     New medical conditions?  No Recent hospitalizations or surgeries? No Started any new medications? No Patient made aware to contact office to inform of any new medications started. Any changes in activities of daily living? No  Pre-procedure testing scheduled: lab work completed on 06/12/23  Will inquire from Dr. Graciela Husbands if patient should hold ASA for any amount of time prior to procedure.  Confirmed patient is scheduled for   ICD generator change on Friday, April 11 with Dr. Sherryl Manges. Instructed patient to arrive at the Main Entrance A at [facility]: [address] [place], [place] [address] and check in at Admitting at 5:30 AM  Advised of plan to go home the same day and will only stay overnight if medically necessary. You MUST have a responsible adult to drive you home and MUST be with you the first 24 hours after you arrive home or your procedure could be cancelled.  Patient verbalized understanding to information provided and is agreeable to proceed with procedure.

## 2023-06-14 ENCOUNTER — Encounter (HOSPITAL_COMMUNITY): Payer: Self-pay

## 2023-06-14 NOTE — Telephone Encounter (Signed)
 Per Dr. Graciela Husbands, we can have him hold for 3 days.   Called patient to inform him of provider recommendations. He voiced understanding and requested for me to also send this information to his MyChart.

## 2023-06-22 DIAGNOSIS — I10 Essential (primary) hypertension: Secondary | ICD-10-CM | POA: Diagnosis not present

## 2023-06-22 DIAGNOSIS — E1169 Type 2 diabetes mellitus with other specified complication: Secondary | ICD-10-CM | POA: Diagnosis not present

## 2023-06-22 DIAGNOSIS — R413 Other amnesia: Secondary | ICD-10-CM | POA: Diagnosis not present

## 2023-06-22 DIAGNOSIS — E038 Other specified hypothyroidism: Secondary | ICD-10-CM | POA: Diagnosis not present

## 2023-06-22 DIAGNOSIS — E66813 Obesity, class 3: Secondary | ICD-10-CM | POA: Diagnosis not present

## 2023-06-27 ENCOUNTER — Ambulatory Visit: Attending: Internal Medicine

## 2023-06-27 DIAGNOSIS — I5022 Chronic systolic (congestive) heart failure: Secondary | ICD-10-CM

## 2023-06-27 DIAGNOSIS — Z95810 Presence of automatic (implantable) cardiac defibrillator: Secondary | ICD-10-CM

## 2023-06-27 NOTE — Addendum Note (Signed)
 Addended by: Geralyn Flash D on: 06/27/2023 04:28 PM   Modules accepted: Orders

## 2023-06-27 NOTE — Progress Notes (Signed)
 EPIC Encounter for ICM Monitoring  Patient Name: Rickey Rice is a 83 y.o. male Date: 06/27/2023 Primary Care Physican: Renaye Rakers, MD Primary Cardiologist: [facility] Electrophysiologist: Joycelyn Schmid Pacing:  99%       01/29/2023 Weight: 300 lbs 02/27/2023 Office Weight: 301 lbs 06/01/2022 Office Weight: 304 lbs   Battery Longevity: reached ERI and scheduled for replacement 4/11                                         Spoke with patient and heart failure questions reviewed.  Transmission results reviewed.  Pt asymptomatic for fluid accumulation. He is feeling some stress related to family members that are sick and church members that have passed away.     Corvue Thoracic impedance suggesting possible fluid accumulation from 3/24-4/1.   Prescribed:  Furosemide 20 mg take 1 tablet by mouth daily. Spironolactone 25 mg take 0.5 tablet (12.5 mg total) by mouth daily   Labs: 01/31/2023 Creatinine 1.78, BUN 28, Potassium 4.0, Sodium 140, GFR 34 A complete set of results can be found in Results Review.   Recommendations:  No changes and encouraged to call if experiencing any fluid symptoms.   Follow-up plan: ICM clinic phone appointment on 08/06/2023.  91 day device clinic remote transmission 07/26/2023.       EP/Cardiology Office Visits:   Recall 08/02/2023 with Dr Rennis Golden.     Copy of ICM check sent to Dr. Graciela Husbands.   3 month ICM trend: 06/27/2023.    12-14 Month ICM trend:     [name], RN 06/27/2023 7:33 AM

## 2023-06-27 NOTE — Progress Notes (Signed)
 Remote ICD transmission.

## 2023-06-28 ENCOUNTER — Ambulatory Visit (INDEPENDENT_AMBULATORY_CARE_PROVIDER_SITE_OTHER): Payer: Medicare PPO

## 2023-06-28 DIAGNOSIS — I428 Other cardiomyopathies: Secondary | ICD-10-CM

## 2023-06-29 ENCOUNTER — Telehealth (HOSPITAL_COMMUNITY): Payer: Self-pay

## 2023-06-29 NOTE — Telephone Encounter (Signed)
 Call placed to patient to discuss upcoming procedure.   Confirmed patient is scheduled for a ICD generator change on Friday, April 11 with Dr. Sherryl Manges. Instructed patient to arrive at the Main Entrance A at [facility]: [address] [place], [place] [address] and check in at Admitting at 5:30 AM.  Labs completed  Any recent signs of acute illness or been started on antibiotics? No Any new medications started? No Any medications to hold? Particia Jasper for 3 days Medication instructions:  On the morning of your procedure DO NOT take any medication. No eating or drinking after midnight prior to procedure.   The night before your procedure and the morning of your procedure scrub your neck/chest with the CHG surgical soap.   Advised of plan to go home the same day and will only stay overnight if medically necessary. You MUST have a responsible adult to drive you home and MUST be with you the first 24 hours after you arrive home.  Patient verbalized understanding to all instructions provided and agreed to proceed with procedure.

## 2023-07-05 ENCOUNTER — Telehealth: Payer: Self-pay

## 2023-07-05 NOTE — Telephone Encounter (Signed)
 Spoke with pt and advised due to an emergency pt's device generator change will need to be rescheduled to 07/24/2023.  Pt verbalizes understanding and is agreeable with current plan.

## 2023-07-10 ENCOUNTER — Telehealth: Payer: Self-pay | Admitting: Internal Medicine

## 2023-07-10 DIAGNOSIS — Z01812 Encounter for preprocedural laboratory examination: Secondary | ICD-10-CM

## 2023-07-10 NOTE — Telephone Encounter (Signed)
 Spoke with pt regarding labs prior to his ICD generator change. Lab orders were put in and released (BMET, CBC). Pt plans to go to [employer] [facility]. Pt verbalized understanding. All questions if any were answered.

## 2023-07-10 NOTE — Telephone Encounter (Signed)
 New Message:       Patient says he is going to have lab work tomorrow at one of the [facility] in [place].  He says he is scheduled for battery replacement on 07-24-23. He would like for you to please the order in.

## 2023-07-11 DIAGNOSIS — Z01812 Encounter for preprocedural laboratory examination: Secondary | ICD-10-CM | POA: Diagnosis not present

## 2023-07-11 DIAGNOSIS — Z01810 Encounter for preprocedural cardiovascular examination: Secondary | ICD-10-CM | POA: Diagnosis not present

## 2023-07-12 LAB — CBC
Hematocrit: 36.2 % — ABNORMAL LOW (ref 37.5–51.0)
Hemoglobin: 11.8 g/dL — ABNORMAL LOW (ref 13.0–17.7)
MCH: 32.4 pg (ref 26.6–33.0)
MCHC: 32.6 g/dL (ref 31.5–35.7)
MCV: 100 fL — ABNORMAL HIGH (ref 79–97)
Platelets: 147 10*3/uL — ABNORMAL LOW (ref 150–450)
RBC: 3.64 x10E6/uL — ABNORMAL LOW (ref 4.14–5.80)
RDW: 12.5 % (ref 11.6–15.4)
WBC: 4.8 10*3/uL (ref 3.4–10.8)

## 2023-07-12 LAB — BASIC METABOLIC PANEL WITH GFR
BUN/Creatinine Ratio: 17 (ref 10–24)
BUN: 27 mg/dL (ref 8–27)
CO2: 25 mmol/L (ref 20–29)
Calcium: 9.1 mg/dL (ref 8.6–10.2)
Chloride: 107 mmol/L — ABNORMAL HIGH (ref 96–106)
Creatinine, Ser: 1.59 mg/dL — ABNORMAL HIGH (ref 0.76–1.27)
Glucose: 126 mg/dL — ABNORMAL HIGH (ref 70–99)
Potassium: 4.2 mmol/L (ref 3.5–5.2)
Sodium: 146 mmol/L — ABNORMAL HIGH (ref 134–144)
eGFR: 43 mL/min/{1.73_m2} — ABNORMAL LOW (ref >59)

## 2023-07-16 ENCOUNTER — Telehealth: Payer: Self-pay

## 2023-07-16 NOTE — Telephone Encounter (Signed)
 Pt concerned about being Diabetic and not eating in the morning. He gets up around 6 am. I told him I would be ok to have a light breakfast but nothing to eat or drink after 7 am.

## 2023-07-16 NOTE — Pre-Procedure Instructions (Signed)
Attempted to call patient regarding procedure instructions.  Left voicemail on the following items: Arrival time 1100 Nothing to eat or drink after midnight No meds AM of procedure Responsible person to drive you home and stay with you for 24 hrs Wash with special soap night before and morning of procedure  

## 2023-07-17 ENCOUNTER — Encounter (HOSPITAL_COMMUNITY)
Admission: RE | Disposition: A | Discharge status: Home / Self Care | Payer: Self-pay | Source: Home / Self Care | Attending: Internal Medicine

## 2023-07-17 ENCOUNTER — Ambulatory Visit (HOSPITAL_COMMUNITY)
Admission: RE | Admit: 2023-07-17 | Discharge: 2023-07-17 | Disposition: A | Discharge status: Home / Self Care | Attending: Internal Medicine | Admitting: Internal Medicine

## 2023-07-17 ENCOUNTER — Other Ambulatory Visit: Payer: Self-pay

## 2023-07-17 DIAGNOSIS — I110 Hypertensive heart disease with heart failure: Secondary | ICD-10-CM | POA: Diagnosis not present

## 2023-07-17 DIAGNOSIS — I509 Heart failure, unspecified: Secondary | ICD-10-CM | POA: Insufficient documentation

## 2023-07-17 DIAGNOSIS — I428 Other cardiomyopathies: Secondary | ICD-10-CM | POA: Diagnosis not present

## 2023-07-17 DIAGNOSIS — I429 Cardiomyopathy, unspecified: Secondary | ICD-10-CM

## 2023-07-17 DIAGNOSIS — Z95810 Presence of automatic (implantable) cardiac defibrillator: Secondary | ICD-10-CM

## 2023-07-17 DIAGNOSIS — Z4502 Encounter for adjustment and management of automatic implantable cardiac defibrillator: Secondary | ICD-10-CM | POA: Insufficient documentation

## 2023-07-17 HISTORY — PX: BIV ICD GENERATOR CHANGEOUT: EP1194

## 2023-07-17 LAB — GLUCOSE, CAPILLARY
Glucose-Capillary: 88 mg/dL (ref 70–99)
Glucose-Capillary: 81 mg/dL (ref 70–99)

## 2023-07-17 SURGERY — BIV ICD GENERATOR CHANGEOUT
Anesthesia: LOCAL

## 2023-07-17 MED ORDER — LIDOCAINE HCL (PF) 1 % IJ SOLN
INTRAMUSCULAR | Status: DC | PRN
  Administered 2023-07-17: 60 mL

## 2023-07-17 MED ORDER — ACETAMINOPHEN 325 MG PO TABS: 325.0000 mg | ORAL_TABLET | ORAL | Status: DC | PRN

## 2023-07-17 MED ORDER — MIDAZOLAM HCL 2 MG/2ML IJ SOLN
INTRAMUSCULAR | Status: DC | PRN
  Administered 2023-07-17 (×2): 1 mg via INTRAVENOUS

## 2023-07-17 MED ORDER — LIDOCAINE HCL (PF) 1 % IJ SOLN
INTRAMUSCULAR | Status: AC
  Filled 2023-07-17: qty 60

## 2023-07-17 MED ORDER — SODIUM CHLORIDE 0.9 % IV SOLN
80.0000 mg | INTRAVENOUS | Status: AC
  Administered 2023-07-17: 80 mg

## 2023-07-17 MED ORDER — FENTANYL CITRATE (PF) 100 MCG/2ML IJ SOLN
INTRAMUSCULAR | Status: DC | PRN
  Administered 2023-07-17 (×2): 25 ug via INTRAVENOUS

## 2023-07-17 MED ORDER — FENTANYL CITRATE (PF) 100 MCG/2ML IJ SOLN
INTRAMUSCULAR | Status: AC
  Filled 2023-07-17: qty 2

## 2023-07-17 MED ORDER — CEFAZOLIN SODIUM-DEXTROSE 3-4 GM/150ML-% IV SOLN
3.0000 g | INTRAVENOUS | Status: AC
  Administered 2023-07-17: 3 g via INTRAVENOUS
  Filled 2023-07-17 (×2): qty 150

## 2023-07-17 MED ORDER — MIDAZOLAM HCL 5 MG/5ML IJ SOLN
INTRAMUSCULAR | Status: AC
  Filled 2023-07-17: qty 5

## 2023-07-17 MED ORDER — SODIUM CHLORIDE 0.9 % IV SOLN: INTRAVENOUS | Status: DC

## 2023-07-17 MED ORDER — SODIUM CHLORIDE 0.9 % IV SOLN
INTRAVENOUS | Status: AC
  Filled 2023-07-17: qty 2

## 2023-07-17 MED ORDER — CHLORHEXIDINE GLUCONATE 4 % EX SOLN: 4.0000 | Freq: Once | CUTANEOUS | Status: DC

## 2023-07-17 MED ORDER — POVIDONE-IODINE 10 % EX SWAB: 2.0000 | Freq: Once | CUTANEOUS | Status: DC

## 2023-07-17 SURGICAL SUPPLY — 1 items: DEVICE CRT GALLANT HF DF1 (ICD Generator) IMPLANT

## 2023-07-17 NOTE — Discharge Instructions (Signed)

## 2023-07-17 NOTE — H&P (Signed)
 Patient Care Team: Jonathon Neighbors, MD as PCP - General (Family Medicine) Maximo Spar Aviva Lemmings, MD as PCP - Cardiology (Cardiology) Jacqueline Matsu, MD as PCP - Sleep Medicine (Cardiology) Verona Goodwill, MD as PCP - Electrophysiology (Cardiology)   HPI  Rickey Rice. is a 83 y.o. male admitted for generator replacement of Abbott CRT-D implantation for aborted cardiac arrest in the setting of nonischemic cardiac myopathy and left bundle branch block.  Because of a short RV LV interval, it was ELECTED NOT to activate the LV leads  initially. Subesequently turned on the LV lead with ECG evidence of resynchronization  Full GDMT    Followed by ICM clinic.    The patient denies chest pain,, nocturnal dyspnea, orthopnea.  There have been no palpitations, lightheadedness or syncope.  Complains of chronic stable DOE and mild edema.    Date Cr K Hgb  11/17  1.24 4.2    5/22  1.16 4.1 11.4  7/23 1.4 4.5 12.4  11/24 1.78 4.0 11.4  4/25 1.59 4.2 11.8      DATE TEST EF    3/16 LHC   No obstructive CAD  3/16 Echo  40 %    8/17 Echo  30-35 %    2/21 Echo  34-40%    3/23 Echo  40-45%          Records and Results Reviewed   Past Medical History:  Diagnosis Date   AICD (automatic cardioverter/defibrillator) present    Arthritis    oa   Cardiac arrest (HCC) 06/07/2014   Primary VF arrest with successful resuscitation and s/p ICD implant   Diabetes mellitus without complication (HCC)    type 2   Dyslipidemia    History of nuclear stress test 04/2012   lexiscan  - 2 day protocol; low risk study, evidence of inferrior & apical scar but no ischemia    Hypertension    Hypothyroidism    LBBB (left bundle branch block)    Morbid obesity (HCC)    NICM (nonischemic cardiomyopathy) (HCC)    Prostate enlargement    pt denies    Past Surgical History:  Procedure Laterality Date   BI-VENTRICULAR IMPLANTABLE CARDIOVERTER DEFIBRILLATOR N/A 06/18/2014   STJ CRTD implanted by Dr  Rodolfo Clan   COLONOSCOPY W/ BIOPSIES     COLONOSCOPY WITH PROPOFOL  N/A 04/05/2017   Procedure: COLONOSCOPY WITH PROPOFOL ;  Surgeon: Tami Falcon, MD;  Location: [facility];  Service: Endoscopy;  Laterality: N/A;   FINGER SURGERY  1954   1 st joint  right hand amputated   KNEE ARTHROSCOPY Left    LEFT HEART CATHETERIZATION WITH CORONARY ANGIOGRAM N/A 06/12/2014   Procedure: LEFT HEART CATHETERIZATION WITH CORONARY ANGIOGRAM;  Surgeon: Arleen Lacer, MD;  Location: [facility] CATH LAB;  Service: Cardiovascular;  Laterality: N/A;   MOUTH SURGERY  1963   TOTAL KNEE ARTHROPLASTY Right 02/05/2013   Procedure: TOTAL KNEE ARTHROPLASTY;  Surgeon: Ilean Mall, MD;  Location: [facility] OR;  Service: Orthopedics;  Laterality: Right;   TOTAL KNEE ARTHROPLASTY Left 01/19/2014   Procedure: LEFT TOTAL KNEE ARTHROPLASTY;  Surgeon: Ilean Mall, MD;  Location: [facility] OR;  Service: Orthopedics;  Laterality: Left;    Current Facility-Administered Medications  Medication Dose Route Frequency Provider Last Rate Last Admin   0.9 %  sodium chloride  infusion   Intravenous Continuous Verona Goodwill, MD       ceFAZolin  (ANCEF ) IVPB 3g/150 mL premix  3 g Intravenous On Call  Verona Goodwill, MD       chlorhexidine  (HIBICLENS ) 4 % liquid 4 Application  4 Application Topical Once Verona Goodwill, MD       gentamicin  (GARAMYCIN ) 80 mg in sodium chloride  0.9 % 500 mL irrigation  80 mg Irrigation On Call Verona Goodwill, MD       povidone-iodine  10 % swab 2 Application  2 Application Topical Once Verona Goodwill, MD       Current Meds  Medication Sig   aspirin  EC 81 MG tablet Take 81 mg by mouth daily.   brimonidine (ALPHAGAN) 0.2 % ophthalmic solution Place 1 drop into the left eye 3 (three) times daily.   carboxymethylcellulose (REFRESH PLUS) 0.5 % SOLN Place 1 drop into the right eye daily as needed (dry eyes).   carvedilol  (COREG ) 12.5 MG tablet Take 0.5 tablets (6.25 mg total) by mouth 2 (two) times daily with a meal.   CINNAMON PO  Take 1,000 mg by mouth daily.   dapagliflozin propanediol (FARXIGA) 10 MG TABS tablet Take 10 mg by mouth daily.   furosemide  (LASIX ) 20 MG tablet Take 1 tablet (20 mg total) by mouth daily.   JANUVIA 100 MG tablet Take 100 mg by mouth daily.   levothyroxine  (SYNTHROID , LEVOTHROID) 75 MCG tablet Take 75 mcg by mouth See admin instructions. Take 75 mcg daily on Monday through Saturday, skip Sundays   Magnesium  250 MG CAPS Take 250 mg by mouth daily.   Multiple Vitamins-Minerals (ICAPS AREDS 2) CAPS Take 1 capsule by mouth 2 (two) times daily.   Omega-3 Fatty Acids (OMEGA 3 PO) Take 1 capsule by mouth 2 (two) times daily.   rosuvastatin  (CRESTOR ) 20 MG tablet Take 20 mg by mouth daily.   sacubitril -valsartan  (ENTRESTO ) 97-103 MG Take 1/2 tablet by mouth twice daily   spironolactone  (ALDACTONE ) 25 MG tablet TAKE 1/2 TABLET(12.5 MG) BY MOUTH DAILY   TURMERIC PO Take 1 tablet by mouth 2 (two) times daily.    Allergies  Allergen Reactions   Bee Venom Swelling    Swelling on lips and tongue   Demerol  [Meperidine ]     Not sure   Pioglitazone     Lips swelling= actos   Sulfa Antibiotics     Not sure   Shrimp Extract Rash      Social History   Tobacco Use   Smoking status: Never   Smokeless tobacco: Never  Vaping Use   Vaping status: Never Used  Substance Use Topics   Alcohol use: Yes    Comment: rare wine use   Drug use: No     Family History  Problem Relation Age of Onset   Cancer Mother    Kidney disease Brother    Hypertension Sister      Current Meds  Medication Sig   aspirin  EC 81 MG tablet Take 81 mg by mouth daily.   brimonidine (ALPHAGAN) 0.2 % ophthalmic solution Place 1 drop into the left eye 3 (three) times daily.   carboxymethylcellulose (REFRESH PLUS) 0.5 % SOLN Place 1 drop into the right eye daily as needed (dry eyes).   carvedilol  (COREG ) 12.5 MG tablet Take 0.5 tablets (6.25 mg total) by mouth 2 (two) times daily with a meal.   CINNAMON PO Take 1,000 mg  by mouth daily.   dapagliflozin propanediol (FARXIGA) 10 MG TABS tablet Take 10 mg by mouth daily.   furosemide  (LASIX ) 20 MG tablet Take 1 tablet (20 mg total) by mouth daily.  JANUVIA 100 MG tablet Take 100 mg by mouth daily.   levothyroxine  (SYNTHROID , LEVOTHROID) 75 MCG tablet Take 75 mcg by mouth See admin instructions. Take 75 mcg daily on Monday through Saturday, skip Sundays   Magnesium  250 MG CAPS Take 250 mg by mouth daily.   Multiple Vitamins-Minerals (ICAPS AREDS 2) CAPS Take 1 capsule by mouth 2 (two) times daily.   Omega-3 Fatty Acids (OMEGA 3 PO) Take 1 capsule by mouth 2 (two) times daily.   rosuvastatin  (CRESTOR ) 20 MG tablet Take 20 mg by mouth daily.   sacubitril -valsartan  (ENTRESTO ) 97-103 MG Take 1/2 tablet by mouth twice daily   spironolactone  (ALDACTONE ) 25 MG tablet TAKE 1/2 TABLET(12.5 MG) BY MOUTH DAILY   TURMERIC PO Take 1 tablet by mouth 2 (two) times daily.     Review of Systems negative except from HPI and PMH  Physical Exam BP 111/74   Pulse 63   Temp 98.4 F (36.9 C) (Oral)   Resp 17   Ht 5\' 11"  (1.803 m)   Wt 135.2 kg   SpO2 100%   BMI 41.56 kg/m  Well developed and well nourished in no acute distress HENT normal E scleral and icterus clear Neck Supple JVP flat; carotids brisk and full Clear to ausculation Regular rate and rhythm, no murmurs gallops or rub Soft with active bowel sounds No clubbing cyanosis Trace and 1+ Edema Alert and oriented, grossly normal motor and sensory function Skin Warm and Dry    Assessment and  Plan  NICM  Aborted cardiac Arrest   VT-polymorphic /syncope    CRT-D Abbott    Hypertension   CHF chronic-systolic/diastolic   Device at [facility]  We have reviewed the benefits and risks of generator replacement.  These include but are not limited to lead fracture and infection.  The patient understands, agrees and is willing to proceed.    Continue meds with GDMT

## 2023-07-18 ENCOUNTER — Encounter (HOSPITAL_COMMUNITY): Payer: Self-pay | Admitting: Internal Medicine

## 2023-07-18 MED FILL — Midazolam HCl Inj 5 MG/5ML (Base Equivalent): INTRAMUSCULAR | Qty: 2 | Status: AC

## 2023-08-01 ENCOUNTER — Ambulatory Visit: Attending: Cardiology

## 2023-08-01 DIAGNOSIS — I472 Ventricular tachycardia, unspecified: Secondary | ICD-10-CM | POA: Diagnosis not present

## 2023-08-01 NOTE — Patient Instructions (Signed)
? ?  After Your ICD (Implantable Cardiac Defibrillator)    Monitor your defibrillator site for redness, swelling, and drainage. Call the device clinic at [phone] if you experience these symptoms or fever/chills.  Your incision was closed with Dermabond:  You may shower 1 day after your defibrillator implant and wash your incision with soap and water. Avoid lotions, ointments, or perfumes over your incision until it is well-healed.  You may use a hot tub or a pool after your wound check appointment if the incision is completely closed.  Your ICD is designed to protect you from life threatening heart rhythms. Because of this, you may receive a shock.   1 shock with no symptoms:  Call the office during business hours. 1 shock with symptoms (chest pain, chest pressure, dizziness, lightheadedness, shortness of breath, overall feeling unwell):  Call 911. If you experience 2 or more shocks in 24 hours:  Call 911. If you receive a shock, you should not drive.  [place] DMV - no driving for 6 months if you receive appropriate therapy from your ICD.   ICD Alerts:  Some alerts are vibratory and others beep. These are NOT emergencies. Please call our office to let us know. If this occurs at night or on weekends, it can wait until the next business day. Send a remote transmission.  If your device is capable of reading fluid status (for heart failure), you will be offered monthly monitoring to review this with you.   Remote monitoring is used to monitor your ICD from home. This monitoring is scheduled every 91 days by our office. It allows us to keep an eye on the functioning of your device to ensure it is working properly. You will routinely see your Electrophysiologist annually (more often if necessary).  

## 2023-08-02 LAB — CUP PACEART INCLINIC DEVICE CHECK
Battery Remaining Longevity: 81 mo
Brady Statistic RA Percent Paced: 0.02 %
Brady Statistic RV Percent Paced: 99 %
Date Time Interrogation Session: 20250507145800
HighPow Impedance: 55.125
Implantable Lead Connection Status: 753985
Implantable Lead Connection Status: 753985
Implantable Lead Connection Status: 753985
Implantable Lead Implant Date: 20160324
Implantable Lead Implant Date: 20160324
Implantable Lead Implant Date: 20160324
Implantable Lead Location: 753858
Implantable Lead Location: 753859
Implantable Lead Location: 753860
Implantable Lead Model: 7122
Implantable Pulse Generator Implant Date: 20250422
Lead Channel Impedance Value: 375 Ohm
Lead Channel Impedance Value: 475 Ohm
Lead Channel Impedance Value: 512.5 Ohm
Lead Channel Pacing Threshold Amplitude: 0.75 V
Lead Channel Pacing Threshold Amplitude: 0.75 V
Lead Channel Pacing Threshold Amplitude: 0.75 V
Lead Channel Pacing Threshold Amplitude: 0.75 V
Lead Channel Pacing Threshold Amplitude: 1.75 V
Lead Channel Pacing Threshold Amplitude: 1.75 V
Lead Channel Pacing Threshold Pulse Width: 0.5 ms
Lead Channel Pacing Threshold Pulse Width: 0.5 ms
Lead Channel Pacing Threshold Pulse Width: 0.5 ms
Lead Channel Pacing Threshold Pulse Width: 0.5 ms
Lead Channel Pacing Threshold Pulse Width: 0.9 ms
Lead Channel Pacing Threshold Pulse Width: 0.9 ms
Lead Channel Sensing Intrinsic Amplitude: 12 mV
Lead Channel Sensing Intrinsic Amplitude: 3.1 mV
Lead Channel Setting Pacing Amplitude: 2 V
Lead Channel Setting Pacing Amplitude: 2.5 V
Lead Channel Setting Pacing Amplitude: 2.5 V
Lead Channel Setting Pacing Pulse Width: 0.5 ms
Lead Channel Setting Pacing Pulse Width: 0.9 ms
Lead Channel Setting Sensing Sensitivity: 0.5 mV
Pulse Gen Serial Number: 111073116
Zone Setting Status: 755011

## 2023-08-02 NOTE — Progress Notes (Signed)
 Normal CRT chamber ICD wound check. Wound well healed. Presenting rhythm: AS/VS. Routine testing performed. Thresholds, sensing, and impedances consistent with implant measurements with 3.5V safety margin/auto capture until 3 month visit. BIV pacing 99%. No treated arrhythmias. Reviewed shock plan.  Pt enrolled in remote follow-up.

## 2023-08-06 ENCOUNTER — Encounter

## 2023-08-06 NOTE — Progress Notes (Signed)
 Remote ICD transmission.

## 2023-08-21 DIAGNOSIS — R413 Other amnesia: Secondary | ICD-10-CM | POA: Diagnosis not present

## 2023-08-21 DIAGNOSIS — Z0000 Encounter for general adult medical examination without abnormal findings: Secondary | ICD-10-CM | POA: Diagnosis not present

## 2023-08-21 DIAGNOSIS — E038 Other specified hypothyroidism: Secondary | ICD-10-CM | POA: Diagnosis not present

## 2023-08-21 DIAGNOSIS — E1169 Type 2 diabetes mellitus with other specified complication: Secondary | ICD-10-CM | POA: Diagnosis not present

## 2023-08-21 DIAGNOSIS — E662 Morbid (severe) obesity with alveolar hypoventilation: Secondary | ICD-10-CM | POA: Diagnosis not present

## 2023-08-21 DIAGNOSIS — Z95810 Presence of automatic (implantable) cardiac defibrillator: Secondary | ICD-10-CM | POA: Diagnosis not present

## 2023-08-21 DIAGNOSIS — I5022 Chronic systolic (congestive) heart failure: Secondary | ICD-10-CM | POA: Diagnosis not present

## 2023-08-22 ENCOUNTER — Ambulatory Visit: Payer: Self-pay | Admitting: Cardiology

## 2023-08-27 ENCOUNTER — Ambulatory Visit: Attending: Internal Medicine

## 2023-08-27 DIAGNOSIS — Z95810 Presence of automatic (implantable) cardiac defibrillator: Secondary | ICD-10-CM

## 2023-08-27 DIAGNOSIS — I5022 Chronic systolic (congestive) heart failure: Secondary | ICD-10-CM | POA: Diagnosis not present

## 2023-08-28 NOTE — Progress Notes (Signed)
 EPIC Encounter for ICM Monitoring  Patient Name: Rickey Rice. is a 83 y.o. male Date: 08/28/2023 Primary Care Physican: Jonathon Neighbors, MD Primary Cardiologist: [facility] Electrophysiologist: Victorino Grates Pacing:  >99%       01/29/2023 Weight: 300 lbs 02/27/2023 Office Weight: 301 lbs 06/01/2022 Office Weight: 304 lbs  08/28/2023 Weight: 292 lbs                                        Spoke with patient and heart failure questions reviewed.  Transmission results reviewed.  Pt asymptomatic for fluid accumulation.  Reports feeling well at this time and voices no complaints.     Corvue Thoracic impedance suggesting normal fluid levels since 5/6.   Prescribed:  Furosemide  20 mg take 1 tablet by mouth daily. Spironolactone  25 mg take 0.5 tablet (12.5 mg total) by mouth daily   Labs: 01/31/2023 Creatinine 1.78, BUN 28, Potassium 4.0, Sodium 140, GFR 34 A complete set of results can be found in Results Review.   Recommendations:  No changes and encouraged to call if experiencing any fluid symptoms.   Follow-up plan: ICM clinic phone appointment on 10/08/2023.  91 day device clinic remote transmission 11/28/2023.       EP/Cardiology Office Visits:   10/16/2023 with Dr Maximo Spar.  10/16/2023 with Creighton Doffing, NP.   Copy of ICM check sent to Dr. Rodolfo Clan.    3 month ICM trend: 08/27/2023.    12-14 Month ICM trend:     Almyra Jain, RN 08/28/2023 12:58 PM

## 2023-08-29 ENCOUNTER — Ambulatory Visit (INDEPENDENT_AMBULATORY_CARE_PROVIDER_SITE_OTHER)

## 2023-08-29 DIAGNOSIS — I5022 Chronic systolic (congestive) heart failure: Secondary | ICD-10-CM | POA: Diagnosis not present

## 2023-08-29 LAB — CUP PACEART REMOTE DEVICE CHECK
Battery Remaining Longevity: 88 mo
Battery Remaining Percentage: 94 %
Battery Voltage: 3.02 V
Brady Statistic AP VP Percent: 1 %
Brady Statistic AP VS Percent: 1 %
Brady Statistic AS VP Percent: 99 %
Brady Statistic AS VS Percent: 1 %
Brady Statistic RA Percent Paced: 1 %
Date Time Interrogation Session: 20250604020400
HighPow Impedance: 62 Ohm
Implantable Lead Connection Status: 753985
Implantable Lead Connection Status: 753985
Implantable Lead Connection Status: 753985
Implantable Lead Implant Date: 20160324
Implantable Lead Implant Date: 20160324
Implantable Lead Implant Date: 20160324
Implantable Lead Location: 753858
Implantable Lead Location: 753859
Implantable Lead Location: 753860
Implantable Lead Model: 7122
Implantable Pulse Generator Implant Date: 20250422
Lead Channel Impedance Value: 430 Ohm
Lead Channel Impedance Value: 490 Ohm
Lead Channel Impedance Value: 510 Ohm
Lead Channel Pacing Threshold Amplitude: 0.75 V
Lead Channel Pacing Threshold Amplitude: 0.75 V
Lead Channel Pacing Threshold Amplitude: 1.75 V
Lead Channel Pacing Threshold Pulse Width: 0.5 ms
Lead Channel Pacing Threshold Pulse Width: 0.5 ms
Lead Channel Pacing Threshold Pulse Width: 0.9 ms
Lead Channel Sensing Intrinsic Amplitude: 12 mV
Lead Channel Sensing Intrinsic Amplitude: 2.6 mV
Lead Channel Setting Pacing Amplitude: 2 V
Lead Channel Setting Pacing Amplitude: 2.5 V
Lead Channel Setting Pacing Amplitude: 2.5 V
Lead Channel Setting Pacing Pulse Width: 0.5 ms
Lead Channel Setting Pacing Pulse Width: 0.9 ms
Lead Channel Setting Sensing Sensitivity: 0.5 mV
Pulse Gen Serial Number: 111073116
Zone Setting Status: 755011

## 2023-09-06 DIAGNOSIS — G4733 Obstructive sleep apnea (adult) (pediatric): Secondary | ICD-10-CM | POA: Diagnosis not present

## 2023-09-21 ENCOUNTER — Ambulatory Visit: Payer: Self-pay | Admitting: Cardiology

## 2023-10-02 DIAGNOSIS — Z95810 Presence of automatic (implantable) cardiac defibrillator: Secondary | ICD-10-CM | POA: Diagnosis not present

## 2023-10-02 DIAGNOSIS — E1169 Type 2 diabetes mellitus with other specified complication: Secondary | ICD-10-CM | POA: Diagnosis not present

## 2023-10-02 DIAGNOSIS — I5022 Chronic systolic (congestive) heart failure: Secondary | ICD-10-CM | POA: Diagnosis not present

## 2023-10-02 DIAGNOSIS — E1165 Type 2 diabetes mellitus with hyperglycemia: Secondary | ICD-10-CM | POA: Diagnosis not present

## 2023-10-02 DIAGNOSIS — I472 Ventricular tachycardia, unspecified: Secondary | ICD-10-CM | POA: Diagnosis not present

## 2023-10-02 DIAGNOSIS — I10 Essential (primary) hypertension: Secondary | ICD-10-CM | POA: Diagnosis not present

## 2023-10-02 DIAGNOSIS — I110 Hypertensive heart disease with heart failure: Secondary | ICD-10-CM | POA: Diagnosis not present

## 2023-10-02 DIAGNOSIS — E7800 Pure hypercholesterolemia, unspecified: Secondary | ICD-10-CM | POA: Diagnosis not present

## 2023-10-02 DIAGNOSIS — Z6841 Body Mass Index (BMI) 40.0 and over, adult: Secondary | ICD-10-CM | POA: Diagnosis not present

## 2023-10-02 DIAGNOSIS — E039 Hypothyroidism, unspecified: Secondary | ICD-10-CM | POA: Diagnosis not present

## 2023-10-08 ENCOUNTER — Ambulatory Visit: Attending: Internal Medicine

## 2023-10-08 DIAGNOSIS — Z95810 Presence of automatic (implantable) cardiac defibrillator: Secondary | ICD-10-CM

## 2023-10-08 DIAGNOSIS — I5022 Chronic systolic (congestive) heart failure: Secondary | ICD-10-CM

## 2023-10-08 NOTE — Progress Notes (Addendum)
  Electrophysiology Office Note:   Date:  10/16/2023  ID:  Rickey KANDICE Lenon Mickey., DOB 03-Nov-1940, MRN [id number]  Primary Cardiologist: Vinie JAYSON Maxcy, MD Primary Heart Failure: None Electrophysiologist: OLE ONEIDA HOLTS, MD  > Dr. Almetta     History of Present Illness:   Rickey Polanco. is a 83 y.o. male with h/o VF arrest / NICM, LBBB s/p CRT-D, HLD, HTN, hypothyroidism, obesity, CKD II, DM II seen today for routine electrophysiology follow-up s/p generator change.  Since last being seen in our clinic the patient reports doing very well post generator change. Site healed well without issues.    He denies chest pain, palpitations, dyspnea, PND, orthopnea, nausea, vomiting, dizziness, syncope, edema, weight gain, or early satiety.    Review of systems complete and found to be negative unless listed in HPI.    EP Information / Studies Reviewed:    EKG is not ordered today. EKG from 07/17/23 reviewed which showed ASVP 51 bpm      ICD Interrogation-  reviewed in detail today,  See PACEART report.  Device History: Abbott BiV ICD implanted 06/18/14 for NICM  Generator Change > 07/17/23 History of appropriate therapy: Yes History of AAD therapy: No   Studies:  LHC 2016 > no obstructive CAD  ECHO 05/2021 > LVEF 40-455    Arrhythmia / AAD VT   Risk Assessment/Calculations:              Physical Exam:   VS:  BP (!) 90/40   Pulse 76   Ht 5' 11.5 (1.816 m)   Wt 289 lb 14.4 oz (131.5 kg)   SpO2 95%   BMI 39.87 kg/m    Wt Readings from Last 3 Encounters:  10/16/23 289 lb 14.4 oz (131.5 kg)  07/17/23 298 lb (135.2 kg)  06/01/23 (!) 304 lb 6.4 oz (138.1 kg)     GEN: well nourished elderly male, well developed in no acute distress NECK: No JVD; No carotid bruits CARDIAC: Regular rate and rhythm, no murmurs, rubs, gallops RESPIRATORY:  Clear to auscultation without rales, wheezing or rhonchi  ABDOMEN: Soft, non-tender, non-distended EXTREMITIES:  No significant edema,  wearing LE compression stockings; No deformity   ASSESSMENT AND PLAN:    Chronic Systolic Dysfunction / NICM s/p Abbott CRT-D  Hx VF Arrest  -euvolemic on exam / by device   -99% BiV pacing  -Stable on an appropriate medical regimen -Normal ICD function -See Pace Art report -GDMT per Cardiology > BP soft, not symptomatic / will have him come back to discuss with Cardiology as Dr. Fernande had previously reduced therapy   -No programming changes > stable device check, noted with LV threshold testing M2-RV Coil with possible anodal stimulation (RV/LV line up during testing) but noticed same phenomenon in bipolar configuration > M2-RV Coil was best threshold.  Others were >4 V. Appears this has been his same configuration from many years. CS lead appears anterior on CXR in lateral view.   Hypertension  -well controlled on current regimen   OSA  -CPAP compliance encouraged    Disposition:   Follow up with Dr. Almetta in 12 months (pt wants [place]).  Discussed transition to Dr. Almetta from Dr. Fernande with patient.    Signed, Rickey Barrack, NP-C, AGACNP-BC [facility] - Electrophysiology  10/16/2023, 5:46 PM

## 2023-10-10 NOTE — Progress Notes (Signed)
 EPIC Encounter for ICM Monitoring  Patient Name: Rickey Rice. is a 83 y.o. male Date: 10/10/2023 Primary Care Physican: Benjamine Aland, MD Primary Cardiologist: [facility] Electrophysiologist: Fernande Pore Pacing:  >99%       01/29/2023 Weight: 300 lbs 02/27/2023 Office Weight: 301 lbs 06/01/2022 Office Weight: 304 lbs  08/28/2023 Weight: 292 lbs 10/10/2023 Weight: 287 lbs                                        Spoke with patient and heart failure questions reviewed.  Transmission results reviewed.  Pt asymptomatic for fluid accumulation.  He is working out at [employer] for the past 3 weeks and feeling very good.    Corvue Thoracic impedance suggesting normal fluid levels but was suggesting possible fluid accumulation 6/22-7/1.   Prescribed:  Furosemide  20 mg take 1 tablet by mouth daily. Spironolactone  25 mg take 0.5 tablet (12.5 mg total) by mouth daily   Labs: 07/11/2023 Creatinine 1.59, BUN 27, Potassium 4.2, Sodium 146, GFR 43  06/12/2023 Creatinine 1.59, BUN 21, Potassium 5.0, Sodium 144, GFR 43  A complete set of results can be found in Results Review.   Recommendations:  No changes and encouraged to call if experiencing any fluid symptoms.   Follow-up plan: ICM clinic phone appointment on 11/12/2023.  91 day device clinic remote transmission 11/28/2023.       EP/Cardiology Office Visits:  10/16/2023 with Dr Mona.  10/16/2023 with Daphne Barrack, NP.   Copy of ICM check sent to Dr. Fernande.   3 month ICM trend: 10/08/2023.    12-14 Month ICM trend:     Mitzie GORMAN Garner, RN 10/10/2023 4:34 PM

## 2023-10-16 ENCOUNTER — Encounter: Payer: Self-pay | Admitting: Pulmonary Disease

## 2023-10-16 ENCOUNTER — Ambulatory Visit: Attending: Cardiology | Admitting: Pulmonary Disease

## 2023-10-16 ENCOUNTER — Ambulatory Visit: Admitting: Internal Medicine

## 2023-10-16 VITALS — BP 90/40 | HR 76 | Ht 71.5 in | Wt 289.9 lb

## 2023-10-16 DIAGNOSIS — I428 Other cardiomyopathies: Secondary | ICD-10-CM | POA: Diagnosis not present

## 2023-10-16 DIAGNOSIS — Z95810 Presence of automatic (implantable) cardiac defibrillator: Secondary | ICD-10-CM | POA: Diagnosis not present

## 2023-10-16 DIAGNOSIS — I5022 Chronic systolic (congestive) heart failure: Secondary | ICD-10-CM | POA: Diagnosis not present

## 2023-10-16 DIAGNOSIS — I10 Essential (primary) hypertension: Secondary | ICD-10-CM

## 2023-10-16 DIAGNOSIS — G4733 Obstructive sleep apnea (adult) (pediatric): Secondary | ICD-10-CM

## 2023-10-16 DIAGNOSIS — I472 Ventricular tachycardia, unspecified: Secondary | ICD-10-CM | POA: Diagnosis not present

## 2023-10-16 LAB — CUP PACEART INCLINIC DEVICE CHECK
Battery Remaining Longevity: 79 mo
Brady Statistic RA Percent Paced: 0.02 %
Brady Statistic RV Percent Paced: 99.07 %
Date Time Interrogation Session: 20250722174716
HighPow Impedance: 57.375
Implantable Lead Connection Status: 753985
Implantable Lead Connection Status: 753985
Implantable Lead Connection Status: 753985
Implantable Lead Implant Date: 20160324
Implantable Lead Implant Date: 20160324
Implantable Lead Implant Date: 20160324
Implantable Lead Location: 753858
Implantable Lead Location: 753859
Implantable Lead Location: 753860
Implantable Lead Model: 7122
Implantable Pulse Generator Implant Date: 20250422
Lead Channel Impedance Value: 412.5 Ohm
Lead Channel Impedance Value: 475 Ohm
Lead Channel Impedance Value: 512.5 Ohm
Lead Channel Pacing Threshold Amplitude: 1 V
Lead Channel Pacing Threshold Amplitude: 1 V
Lead Channel Pacing Threshold Amplitude: 1 V
Lead Channel Pacing Threshold Amplitude: 1 V
Lead Channel Pacing Threshold Amplitude: 1.75 V
Lead Channel Pacing Threshold Amplitude: 1.75 V
Lead Channel Pacing Threshold Pulse Width: 0.5 ms
Lead Channel Pacing Threshold Pulse Width: 0.5 ms
Lead Channel Pacing Threshold Pulse Width: 0.5 ms
Lead Channel Pacing Threshold Pulse Width: 0.5 ms
Lead Channel Pacing Threshold Pulse Width: 0.9 ms
Lead Channel Pacing Threshold Pulse Width: 0.9 ms
Lead Channel Sensing Intrinsic Amplitude: 12 mV
Lead Channel Sensing Intrinsic Amplitude: 2.7 mV
Lead Channel Setting Pacing Amplitude: 2 V
Lead Channel Setting Pacing Amplitude: 2.5 V
Lead Channel Setting Pacing Amplitude: 2.5 V
Lead Channel Setting Pacing Pulse Width: 0.5 ms
Lead Channel Setting Pacing Pulse Width: 0.9 ms
Lead Channel Setting Sensing Sensitivity: 0.5 mV
Pulse Gen Serial Number: 111073116
Zone Setting Status: 755011

## 2023-10-16 NOTE — Patient Instructions (Signed)
 Medication Instructions:  No medications changes today   Keep a record of your blood pressures at home and revisit with Dr. Mona and his team to assess for any needed medication changes.  *If you need a refill on your cardiac medications before your next appointment, please call your pharmacy*  Lab Work: No lab work today If you have labs (blood work) drawn today and your tests are completely normal, you will receive your results only by: MyChart Message (if you have MyChart) OR A paper copy in the mail If you have any lab test that is abnormal or we need to change your treatment, we will call you to review the results.  Testing/Procedures: No testing/procedures were scheduled today  Follow-Up: At [facility], you and your health needs are our priority.  As part of our continuing mission to provide you with exceptional heart care, our providers are all part of one team.  This team includes your primary Cardiologist (physician) and Advanced Practice Providers or APPs (Physician Assistants and Nurse Practitioners) who all work together to provide you with the care you need, when you need it.  Your next appointment:   1 year(s) with EP   Provider:   You may see OLE ONEIDA HOLTS, MD (pt may want to change to Dr. Almetta due to location) or one of the following Advanced Practice Providers on your designated Care Team:    Daphne Barrack, NP     Please schedule an appt sooner with Dr. Mona or his APP for blood pressure review in 2 weeks.   We recommend signing up for the patient portal called MyChart.  Sign up information is provided on this After Visit Summary.  MyChart is used to connect with patients for Virtual Visits (Telemedicine).  Patients are able to view lab/test results, encounter notes, upcoming appointments, etc.  Non-urgent messages can be sent to your provider as well.   To learn more about what you can do with MyChart, go to [email or web address].

## 2023-10-22 NOTE — Progress Notes (Signed)
 Remote ICD transmission.

## 2023-11-04 ENCOUNTER — Encounter (HOSPITAL_COMMUNITY): Payer: Self-pay

## 2023-11-04 ENCOUNTER — Other Ambulatory Visit: Payer: Self-pay

## 2023-11-04 ENCOUNTER — Inpatient Hospital Stay (HOSPITAL_COMMUNITY)
Admission: EM | Admit: 2023-11-04 | Discharge: 2023-11-07 | DRG: 872 | Disposition: A | Discharge status: Home Health | Attending: Family Medicine | Admitting: Family Medicine

## 2023-11-04 ENCOUNTER — Emergency Department (HOSPITAL_COMMUNITY)

## 2023-11-04 DIAGNOSIS — Z888 Allergy status to other drugs, medicaments and biological substances status: Secondary | ICD-10-CM

## 2023-11-04 DIAGNOSIS — B9689 Other specified bacterial agents as the cause of diseases classified elsewhere: Secondary | ICD-10-CM | POA: Diagnosis present

## 2023-11-04 DIAGNOSIS — A419 Sepsis, unspecified organism: Principal | ICD-10-CM | POA: Diagnosis present

## 2023-11-04 DIAGNOSIS — Z7984 Long term (current) use of oral hypoglycemic drugs: Secondary | ICD-10-CM | POA: Diagnosis not present

## 2023-11-04 DIAGNOSIS — Z91030 Bee allergy status: Secondary | ICD-10-CM

## 2023-11-04 DIAGNOSIS — K115 Sialolithiasis: Secondary | ICD-10-CM | POA: Diagnosis present

## 2023-11-04 DIAGNOSIS — N390 Urinary tract infection, site not specified: Secondary | ICD-10-CM | POA: Diagnosis not present

## 2023-11-04 DIAGNOSIS — Z79890 Hormone replacement therapy (postmenopausal): Secondary | ICD-10-CM | POA: Diagnosis not present

## 2023-11-04 DIAGNOSIS — I130 Hypertensive heart and chronic kidney disease with heart failure and stage 1 through stage 4 chronic kidney disease, or unspecified chronic kidney disease: Secondary | ICD-10-CM | POA: Diagnosis not present

## 2023-11-04 DIAGNOSIS — Z7982 Long term (current) use of aspirin: Secondary | ICD-10-CM | POA: Diagnosis not present

## 2023-11-04 DIAGNOSIS — I428 Other cardiomyopathies: Secondary | ICD-10-CM | POA: Diagnosis present

## 2023-11-04 DIAGNOSIS — N400 Enlarged prostate without lower urinary tract symptoms: Secondary | ICD-10-CM | POA: Diagnosis present

## 2023-11-04 DIAGNOSIS — I5042 Chronic combined systolic (congestive) and diastolic (congestive) heart failure: Secondary | ICD-10-CM | POA: Diagnosis not present

## 2023-11-04 DIAGNOSIS — N182 Chronic kidney disease, stage 2 (mild): Secondary | ICD-10-CM | POA: Diagnosis present

## 2023-11-04 DIAGNOSIS — Z95810 Presence of automatic (implantable) cardiac defibrillator: Secondary | ICD-10-CM | POA: Diagnosis not present

## 2023-11-04 DIAGNOSIS — I5022 Chronic systolic (congestive) heart failure: Secondary | ICD-10-CM | POA: Diagnosis present

## 2023-11-04 DIAGNOSIS — Z96653 Presence of artificial knee joint, bilateral: Secondary | ICD-10-CM | POA: Diagnosis present

## 2023-11-04 DIAGNOSIS — E119 Type 2 diabetes mellitus without complications: Secondary | ICD-10-CM | POA: Diagnosis not present

## 2023-11-04 DIAGNOSIS — I517 Cardiomegaly: Secondary | ICD-10-CM | POA: Diagnosis not present

## 2023-11-04 DIAGNOSIS — Z8249 Family history of ischemic heart disease and other diseases of the circulatory system: Secondary | ICD-10-CM | POA: Diagnosis not present

## 2023-11-04 DIAGNOSIS — I447 Left bundle-branch block, unspecified: Secondary | ICD-10-CM | POA: Diagnosis present

## 2023-11-04 DIAGNOSIS — Z79899 Other long term (current) drug therapy: Secondary | ICD-10-CM

## 2023-11-04 DIAGNOSIS — E785 Hyperlipidemia, unspecified: Secondary | ICD-10-CM | POA: Diagnosis present

## 2023-11-04 DIAGNOSIS — W19XXXA Unspecified fall, initial encounter: Secondary | ICD-10-CM | POA: Diagnosis not present

## 2023-11-04 DIAGNOSIS — N179 Acute kidney failure, unspecified: Secondary | ICD-10-CM | POA: Diagnosis present

## 2023-11-04 DIAGNOSIS — I959 Hypotension, unspecified: Secondary | ICD-10-CM | POA: Diagnosis not present

## 2023-11-04 DIAGNOSIS — E1122 Type 2 diabetes mellitus with diabetic chronic kidney disease: Secondary | ICD-10-CM | POA: Diagnosis present

## 2023-11-04 DIAGNOSIS — R531 Weakness: Secondary | ICD-10-CM | POA: Diagnosis not present

## 2023-11-04 DIAGNOSIS — I10 Essential (primary) hypertension: Secondary | ICD-10-CM | POA: Diagnosis not present

## 2023-11-04 DIAGNOSIS — E039 Hypothyroidism, unspecified: Secondary | ICD-10-CM | POA: Diagnosis not present

## 2023-11-04 DIAGNOSIS — M25661 Stiffness of right knee, not elsewhere classified: Secondary | ICD-10-CM | POA: Diagnosis present

## 2023-11-04 DIAGNOSIS — Z6839 Body mass index (BMI) 39.0-39.9, adult: Secondary | ICD-10-CM

## 2023-11-04 DIAGNOSIS — R6520 Severe sepsis without septic shock: Secondary | ICD-10-CM

## 2023-11-04 DIAGNOSIS — R0602 Shortness of breath: Secondary | ICD-10-CM | POA: Diagnosis not present

## 2023-11-04 DIAGNOSIS — Z8674 Personal history of sudden cardiac arrest: Secondary | ICD-10-CM | POA: Diagnosis not present

## 2023-11-04 DIAGNOSIS — R053 Chronic cough: Secondary | ICD-10-CM | POA: Diagnosis present

## 2023-11-04 DIAGNOSIS — I472 Ventricular tachycardia, unspecified: Secondary | ICD-10-CM | POA: Diagnosis present

## 2023-11-04 DIAGNOSIS — R9389 Abnormal findings on diagnostic imaging of other specified body structures: Secondary | ICD-10-CM | POA: Diagnosis not present

## 2023-11-04 DIAGNOSIS — R0989 Other specified symptoms and signs involving the circulatory and respiratory systems: Secondary | ICD-10-CM | POA: Diagnosis not present

## 2023-11-04 DIAGNOSIS — R61 Generalized hyperhidrosis: Secondary | ICD-10-CM | POA: Diagnosis not present

## 2023-11-04 DIAGNOSIS — J9811 Atelectasis: Secondary | ICD-10-CM | POA: Diagnosis not present

## 2023-11-04 DIAGNOSIS — R42 Dizziness and giddiness: Secondary | ICD-10-CM | POA: Diagnosis not present

## 2023-11-04 DIAGNOSIS — Z91013 Allergy to seafood: Secondary | ICD-10-CM

## 2023-11-04 DIAGNOSIS — Z841 Family history of disorders of kidney and ureter: Secondary | ICD-10-CM

## 2023-11-04 LAB — CBC WITH DIFFERENTIAL/PLATELET
Abs Immature Granulocytes: 0.05 K/uL (ref 0.00–0.07)
Basophils Absolute: 0 K/uL (ref 0.0–0.1)
Basophils Relative: 0 %
Eosinophils Absolute: 0 K/uL (ref 0.0–0.5)
Eosinophils Relative: 0 %
HCT: 36.1 % — ABNORMAL LOW (ref 39.0–52.0)
Hemoglobin: 11.6 g/dL — ABNORMAL LOW (ref 13.0–17.0)
Immature Granulocytes: 1 %
Lymphocytes Relative: 6 %
Lymphs Abs: 0.7 K/uL (ref 0.7–4.0)
MCH: 32.8 pg (ref 26.0–34.0)
MCHC: 32.1 g/dL (ref 30.0–36.0)
MCV: 102 fL — ABNORMAL HIGH (ref 80.0–100.0)
Monocytes Absolute: 1.4 K/uL — ABNORMAL HIGH (ref 0.1–1.0)
Monocytes Relative: 12 %
Neutro Abs: 9 K/uL — ABNORMAL HIGH (ref 1.7–7.7)
Neutrophils Relative %: 81 %
Platelets: 146 K/uL — ABNORMAL LOW (ref 150–400)
RBC: 3.54 MIL/uL — ABNORMAL LOW (ref 4.22–5.81)
RDW: 13.8 % (ref 11.5–15.5)
WBC: 11.1 K/uL — ABNORMAL HIGH (ref 4.0–10.5)
nRBC: 0 % (ref 0.0–0.2)

## 2023-11-04 LAB — URINALYSIS, W/ REFLEX TO CULTURE (INFECTION SUSPECTED)
Bilirubin Urine: NEGATIVE
Glucose, UA: >=500 mg/dL — AB
Hgb urine dipstick: NEGATIVE
Ketones, ur: NEGATIVE mg/dL
Nitrite: POSITIVE — AB
Protein, ur: NEGATIVE mg/dL
Specific Gravity, Urine: 1.016 (ref 1.005–1.030)
pH: 5 (ref 5.0–8.0)

## 2023-11-04 LAB — APTT: aPTT: 32 s (ref 24–36)

## 2023-11-04 LAB — PROTIME-INR
INR: 1.1 (ref 0.8–1.2)
Prothrombin Time: 15 s (ref 11.4–15.2)

## 2023-11-04 LAB — PHOSPHORUS: Phosphorus: 2.3 mg/dL — ABNORMAL LOW (ref 2.5–4.6)

## 2023-11-04 LAB — COMPREHENSIVE METABOLIC PANEL WITH GFR
ALT: 13 U/L (ref 0–44)
AST: 16 U/L (ref 15–41)
Albumin: 3.4 g/dL — ABNORMAL LOW (ref 3.5–5.0)
Alkaline Phosphatase: 58 U/L (ref 38–126)
Anion gap: 11 (ref 5–15)
BUN: 20 mg/dL (ref 8–23)
CO2: 24 mmol/L (ref 22–32)
Calcium: 8.8 mg/dL — ABNORMAL LOW (ref 8.9–10.3)
Chloride: 107 mmol/L (ref 98–111)
Creatinine, Ser: 1.54 mg/dL — ABNORMAL HIGH (ref 0.61–1.24)
GFR, Estimated: 44 mL/min — ABNORMAL LOW (ref >60)
Glucose, Bld: 154 mg/dL — ABNORMAL HIGH (ref 70–99)
Potassium: 4 mmol/L (ref 3.5–5.1)
Sodium: 142 mmol/L (ref 135–145)
Total Bilirubin: 1 mg/dL (ref 0.0–1.2)
Total Protein: 6.8 g/dL (ref 6.5–8.1)

## 2023-11-04 LAB — LACTIC ACID, PLASMA
Lactic Acid, Venous: 1.2 mmol/L (ref 0.5–1.9)
Lactic Acid, Venous: 0.9 mmol/L (ref 0.5–1.9)
Lactic Acid, Venous: 1.1 mmol/L (ref 0.5–1.9)
Lactic Acid, Venous: 0.8 mmol/L (ref 0.5–1.9)

## 2023-11-04 LAB — MRSA NEXT GEN BY PCR, NASAL: MRSA by PCR Next Gen: NOT DETECTED

## 2023-11-04 LAB — RESP PANEL BY RT-PCR (RSV, FLU A&B, COVID)  RVPGX2
Influenza A by PCR: NEGATIVE
Influenza B by PCR: NEGATIVE
Resp Syncytial Virus by PCR: NEGATIVE
SARS Coronavirus 2 by RT PCR: NEGATIVE

## 2023-11-04 LAB — GLUCOSE, CAPILLARY
Glucose-Capillary: 149 mg/dL — ABNORMAL HIGH (ref 70–99)
Glucose-Capillary: 162 mg/dL — ABNORMAL HIGH (ref 70–99)

## 2023-11-04 LAB — MAGNESIUM: Magnesium: 2.2 mg/dL (ref 1.7–2.4)

## 2023-11-04 MED ORDER — LACTATED RINGERS IV BOLUS (SEPSIS): 1000.0000 mL | Freq: Once | INTRAVENOUS | Status: DC

## 2023-11-04 MED ORDER — LEVALBUTEROL HCL 0.63 MG/3ML IN NEBU: 0.6300 mg | INHALATION_SOLUTION | Freq: Four times a day (QID) | RESPIRATORY_TRACT | Status: DC | PRN

## 2023-11-04 MED ORDER — SODIUM CHLORIDE 0.9 % IV SOLN
2.0000 g | Freq: Once | INTRAVENOUS | Status: AC
  Administered 2023-11-04: 2 g via INTRAVENOUS
  Filled 2023-11-04: qty 20

## 2023-11-04 MED ORDER — HYDROMORPHONE HCL 1 MG/ML IJ SOLN: 0.5000 mg | INTRAMUSCULAR | Status: DC | PRN

## 2023-11-04 MED ORDER — TRAZODONE HCL 50 MG PO TABS
25.0000 mg | ORAL_TABLET | Freq: Every evening | ORAL | Status: DC | PRN
Start: 2023-11-04 — End: 2023-11-07
  Filled 2023-11-04: qty 1

## 2023-11-04 MED ORDER — SODIUM CHLORIDE 0.9% FLUSH
3.0000 mL | Freq: Two times a day (BID) | INTRAVENOUS | Status: DC
  Administered 2023-11-04 – 2023-11-07 (×9): 3 mL via INTRAVENOUS

## 2023-11-04 MED ORDER — BISACODYL 5 MG PO TBEC: 5.0000 mg | DELAYED_RELEASE_TABLET | Freq: Every day | ORAL | Status: DC | PRN

## 2023-11-04 MED ORDER — LACTATED RINGERS IV SOLN: INTRAVENOUS | Status: DC

## 2023-11-04 MED ORDER — LEVOTHYROXINE SODIUM 75 MCG PO TABS
75.0000 ug | ORAL_TABLET | Freq: Every morning | ORAL | Status: DC
  Administered 2023-11-05 – 2023-11-07 (×6): 75 ug via ORAL
  Filled 2023-11-04 (×3): qty 1

## 2023-11-04 MED ORDER — CARVEDILOL 3.125 MG PO TABS
6.2500 mg | ORAL_TABLET | Freq: Two times a day (BID) | ORAL | Status: DC
  Administered 2023-11-04 – 2023-11-07 (×11): 6.25 mg via ORAL
  Filled 2023-11-04 (×6): qty 2

## 2023-11-04 MED ORDER — SODIUM CHLORIDE 0.9 % IV SOLN
2.0000 g | INTRAVENOUS | Status: DC
  Administered 2023-11-04 – 2023-11-06 (×5): 2 g via INTRAVENOUS
  Filled 2023-11-04 (×3): qty 20

## 2023-11-04 MED ORDER — ROSUVASTATIN CALCIUM 20 MG PO TABS
20.0000 mg | ORAL_TABLET | Freq: Every day | ORAL | Status: DC
  Administered 2023-11-05 – 2023-11-07 (×6): 20 mg via ORAL
  Filled 2023-11-04 (×3): qty 1

## 2023-11-04 MED ORDER — IPRATROPIUM BROMIDE 0.02 % IN SOLN: 0.5000 mg | Freq: Four times a day (QID) | RESPIRATORY_TRACT | Status: DC | PRN

## 2023-11-04 MED ORDER — MAGNESIUM 200 MG PO TABS
250.0000 mg | ORAL_TABLET | Freq: Every day | ORAL | Status: DC
  Filled 2023-11-04 (×2): qty 2

## 2023-11-04 MED ORDER — OXYCODONE HCL 5 MG PO TABS: 5.0000 mg | ORAL_TABLET | ORAL | Status: DC | PRN

## 2023-11-04 MED ORDER — SODIUM CHLORIDE 0.9 % IV BOLUS
2000.0000 mL | Freq: Once | INTRAVENOUS | Status: AC
  Administered 2023-11-04: 2000 mL via INTRAVENOUS

## 2023-11-04 MED ORDER — HEPARIN SODIUM (PORCINE) 5000 UNIT/ML IJ SOLN
5000.0000 [IU] | Freq: Three times a day (TID) | INTRAMUSCULAR | Status: DC
  Administered 2023-11-04 – 2023-11-07 (×16): 5000 [IU] via SUBCUTANEOUS
  Filled 2023-11-04 (×10): qty 1

## 2023-11-04 MED ORDER — DAPAGLIFLOZIN PROPANEDIOL 10 MG PO TABS
10.0000 mg | ORAL_TABLET | Freq: Every day | ORAL | Status: DC
  Administered 2023-11-05 – 2023-11-07 (×6): 10 mg via ORAL
  Filled 2023-11-04 (×4): qty 1

## 2023-11-04 MED ORDER — HYDRALAZINE HCL 20 MG/ML IJ SOLN: 10.0000 mg | INTRAMUSCULAR | Status: DC | PRN

## 2023-11-04 MED ORDER — INSULIN ASPART 100 UNIT/ML IJ SOLN: 0.0000 [IU] | Freq: Three times a day (TID) | INTRAMUSCULAR | Status: DC

## 2023-11-04 MED ORDER — SENNOSIDES-DOCUSATE SODIUM 8.6-50 MG PO TABS: 1.0000 | ORAL_TABLET | Freq: Every evening | ORAL | Status: DC | PRN

## 2023-11-04 MED ORDER — ACETAMINOPHEN 325 MG PO TABS
650.0000 mg | ORAL_TABLET | Freq: Four times a day (QID) | ORAL | Status: DC | PRN
  Administered 2023-11-06 (×2): 650 mg via ORAL
  Filled 2023-11-04: qty 2

## 2023-11-04 MED ORDER — ONDANSETRON HCL 4 MG/2ML IJ SOLN: 4.0000 mg | Freq: Four times a day (QID) | INTRAMUSCULAR | Status: DC | PRN

## 2023-11-04 MED ORDER — CHLORHEXIDINE GLUCONATE CLOTH 2 % EX PADS
6.0000 | MEDICATED_PAD | Freq: Every day | CUTANEOUS | Status: DC
  Administered 2023-11-05 – 2023-11-07 (×4): 6 via TOPICAL

## 2023-11-04 MED ORDER — BRIMONIDINE TARTRATE 0.2 % OP SOLN
1.0000 [drp] | Freq: Three times a day (TID) | OPHTHALMIC | Status: DC
  Administered 2023-11-04 – 2023-11-07 (×16): 1 [drp] via OPHTHALMIC
  Filled 2023-11-04 (×2): qty 5

## 2023-11-04 MED ORDER — FLEET ENEMA RE ENEM: 1.0000 | ENEMA | Freq: Once | RECTAL | Status: DC | PRN

## 2023-11-04 MED ORDER — ASPIRIN 81 MG PO TBEC
81.0000 mg | DELAYED_RELEASE_TABLET | Freq: Every day | ORAL | Status: DC
  Administered 2023-11-05 – 2023-11-07 (×6): 81 mg via ORAL
  Filled 2023-11-04 (×3): qty 1

## 2023-11-04 MED ORDER — ONDANSETRON HCL 4 MG PO TABS: 4.0000 mg | ORAL_TABLET | Freq: Four times a day (QID) | ORAL | Status: DC | PRN

## 2023-11-04 MED ORDER — LACTATED RINGERS IV SOLN: 150.0000 mL/h | INTRAVENOUS | Status: DC

## 2023-11-04 MED ORDER — ACETAMINOPHEN 325 MG PO TABS
650.0000 mg | ORAL_TABLET | Freq: Once | ORAL | Status: AC
  Administered 2023-11-04: 650 mg via ORAL
  Filled 2023-11-04: qty 2

## 2023-11-04 MED ORDER — ACETAMINOPHEN 650 MG RE SUPP: 650.0000 mg | Freq: Four times a day (QID) | RECTAL | Status: DC | PRN

## 2023-11-04 MED ORDER — SODIUM CHLORIDE 0.9 % IV SOLN: INTRAVENOUS | Status: DC

## 2023-11-04 NOTE — Assessment & Plan Note (Signed)
 -  Continue home dose Synthroid

## 2023-11-04 NOTE — Assessment & Plan Note (Signed)
 Continue statins

## 2023-11-04 NOTE — Hospital Course (Signed)
 Rickey G Endres Jr. Is a 71 male with history of chronic systolic/diastolic CHF, AICD, LBBB,  NICM, hypothyroidism, DM2, HTN, HLD, BPH with generalized weaknesses, fever, and dysuria.  Patient reports symptoms started the past 2 days progressive getting worse.  ED evaluation: Blood pressure (!) 88/44 >> 96/48 Tmax 102.3 currently 99.2, pulse 74,RR  20, height 6' (1.829 m), weight 131 kg, SpO2 94%.  Labs: CBC WBC 11.1, hgb 11.6, CMP; creatinine 1.54, calcium  8.8, glucose 154, lactic acid 1.2, 0.9,  Respiratory panel-RSV, Influ A/B, COVID all negative  UA: > 500 glucose, negative for ketones, positive nitrites, many bacteria WBC 11-20  Blood and urine cultures has been obtained Patient told to be septic, initiated IV fluid, IV antibiotics of Rocephin 

## 2023-11-04 NOTE — Assessment & Plan Note (Signed)
 Body mass index is 39.17 kg/m. - Discussed with patient follow-up with PCP in future regarding weight loss program

## 2023-11-04 NOTE — Assessment & Plan Note (Addendum)
-   Patient needs IV fluid resuscitation for sepsis -Monitoring I will try to avoid volume overload -Holding home medication of Lasix  for now -Monitoring I's and O's, daily weight --Home medication reviewed: Aldactone  and Lasix , Farxiga , valsartan  will be on hold-till patient stabilized from sepsis  Echo from March 2023 reviewed: EJF, i 40 to 45% but  may be underestimated in setting of significant septal/lateral septal  asynchrony from BBB. Definity  contrast study poor quality. The left  ventricle has mildly decreased function. The   left ventricle demonstrates global hypokinesis. Left ventricular  diastolic parameters are consistent with Grade I diastolic dysfunction  (impaired relaxation).   2. Right ventricular systolic function is normal.

## 2023-11-04 NOTE — ED Triage Notes (Signed)
 Pt BIB [facility] due to possible code sepsis. Pt currently hypotensive but stated that his BP has been running low and his PCP is aware. Pt also stated that he believes he is weak due to a UTI. Pt with temp of 102.

## 2023-11-04 NOTE — Assessment & Plan Note (Signed)
 With history of left bundle branch block, -If blood pressure allows continue beta-blockers - Monitoring on telemetry closely -Denies any chest pain or dysrhythmia

## 2023-11-04 NOTE — ED Provider Notes (Signed)
 [place] EMERGENCY DEPARTMENT AT [facility] Provider Note   CSN: [id number] Arrival date & time: 11/04/23  1339     Patient presents with: Weakness   Rickey Rice. is a 83 y.o. male.  {Add pertinent medical, surgical, social history, OB history to YEP:67052} Patient with a history of congestive heart failure.  He usually has a blood pressure that runs, low.  Patient started with weakness and fever and dysuria.  The history is provided by the patient and medical records. No language interpreter was used.  Weakness Severity:  Moderate Onset quality:  Sudden Timing:  Constant Progression:  Worsening Chronicity:  New Context: not alcohol use   Relieved by:  Nothing Worsened by:  Nothing Ineffective treatments:  None tried Associated symptoms: no abdominal pain, no chest pain, no cough, no diarrhea, no frequency, no headaches and no seizures        Prior to Admission medications   Medication Sig Start Date End Date Taking? Authorizing Provider  aspirin  EC 81 MG tablet Take 81 mg by mouth daily.    [provider]  brimonidine  (ALPHAGAN ) 0.2 % ophthalmic solution Place 1 drop into the left eye 3 (three) times daily.    [provider]  carboxymethylcellulose (REFRESH PLUS) 0.5 % SOLN Place 1 drop into the right eye daily as needed (dry eyes).    [provider]  carvedilol  (COREG ) 12.5 MG tablet Take 0.5 tablets (6.25 mg total) by mouth 2 (two) times daily with a meal. 05/11/23   Rickey Elspeth BROCKS, MD  CINNAMON PO Take 1,000 mg by mouth daily.    [provider]  dapagliflozin  propanediol (FARXIGA ) 10 MG TABS tablet Take 10 mg by mouth daily.    [provider]  furosemide  (LASIX ) 20 MG tablet Take 1 tablet (20 mg total) by mouth daily. 04/10/23   [name], [name], MD  glucose blood test strip USE AS DIRECTED TO CHECK BLOOD SUGAR BID 01/21/18   [provider]  JANUVIA 100 MG tablet Take 100 mg by mouth daily.  04/28/22   [provider]  levothyroxine  (SYNTHROID , LEVOTHROID) 75 MCG tablet Take 75 mcg by mouth See admin instructions. Take 75 mcg daily on Monday through Saturday, skip Sundays    [provider]  Magnesium  250 MG CAPS Take 250 mg by mouth daily.    [provider]  Multiple Vitamins-Minerals (ICAPS AREDS 2) CAPS Take 1 capsule by mouth 2 (two) times daily.    [provider]  Omega-3 Fatty Acids (OMEGA 3 PO) Take 1 capsule by mouth 2 (two) times daily.    [provider]  rosuvastatin  (CRESTOR ) 20 MG tablet Take 20 mg by mouth daily. 12/09/21   [provider]  sacubitril -valsartan  (ENTRESTO ) 97-103 MG Take 1/2 tablet by mouth twice daily 02/27/23   Rickey Elspeth BROCKS, MD  spironolactone  (ALDACTONE ) 25 MG tablet TAKE 1/2 TABLET(12.5 MG) BY MOUTH DAILY 06/04/23   [name], [name], MD  TURMERIC PO Take 1 tablet by mouth 2 (two) times daily.    [provider]    Allergies: Bee venom, Demerol  [meperidine ], Shellfish-derived products, Sulfa antibiotics, Pioglitazone, and Shrimp extract    Review of Systems  Constitutional:  Negative for appetite change and fatigue.  HENT:  Negative for congestion, ear discharge and sinus pressure.   Eyes:  Negative for discharge.  Respiratory:  Negative for cough.   Cardiovascular:  Negative for chest pain.  Gastrointestinal:  Negative for abdominal pain and diarrhea.  Genitourinary:  Negative for frequency and hematuria.  Musculoskeletal:  Negative for back pain.  Skin:  Negative for rash.  Neurological:  Positive for weakness. Negative for seizures and headaches.  Psychiatric/Behavioral:  Negative for hallucinations.     Updated Vital Signs BP (!) 89/44   Pulse 86   Temp (!) 102.3 F (39.1 C) (Oral)   Ht 6' (1.829 m)   Wt 131 kg   SpO2 95%   BMI 39.17 kg/m   Physical Exam Vitals and nursing note reviewed.  Constitutional:      Appearance: He is well-developed.  HENT:     Head:  Normocephalic.     Nose: Nose normal.  Eyes:     General: No scleral icterus.    Conjunctiva/sclera: Conjunctivae normal.  Neck:     Thyroid: No thyromegaly.  Cardiovascular:     Rate and Rhythm: Normal rate and regular rhythm.     Heart sounds: No murmur heard.    No friction rub. No gallop.  Pulmonary:     Breath sounds: No stridor. No wheezing or rales.  Chest:     Chest wall: No tenderness.  Abdominal:     General: There is no distension.     Tenderness: There is no abdominal tenderness. There is no rebound.  Musculoskeletal:        General: Normal range of motion.     Cervical back: Neck supple.  Lymphadenopathy:     Cervical: No cervical adenopathy.  Skin:    Findings: No erythema or rash.  Neurological:     Mental Status: He is oriented to person, place, and time.     Motor: No abnormal muscle tone.     Coordination: Coordination normal.  Psychiatric:        Behavior: Behavior normal.     (all labs ordered are listed, but only abnormal results are displayed) Labs Reviewed  RESP PANEL BY RT-PCR (RSV, FLU A&B, COVID)  [id number]  CULTURE, BLOOD (ROUTINE X 2)  CULTURE, BLOOD (ROUTINE X 2)  LACTIC ACID, PLASMA  LACTIC ACID, PLASMA  COMPREHENSIVE METABOLIC PANEL WITH GFR  CBC WITH DIFFERENTIAL/PLATELET  PROTIME-INR  URINALYSIS, W/ REFLEX TO CULTURE (INFECTION SUSPECTED)    EKG: None  Radiology: No results found.  {Document cardiac monitor, telemetry assessment procedure when appropriate:32947} Procedures   Medications Ordered in the ED  sodium chloride  0.9 % bolus 2,000 mL (has no administration in time range)  acetaminophen  (TYLENOL ) tablet 650 mg (has no administration in time range)  cefTRIAXone  (ROCEPHIN ) 2 g in sodium chloride  0.9 % 100 mL IVPB (has no administration in time range)      {Click here for ABCD2, HEART and other calculators REFRESH Note before signing:1}                              Medical Decision Making Amount and/or Complexity of  Data Reviewed Labs: ordered. Radiology: ordered.  Risk OTC drugs.   Patient septic, most likely related to urinary tract infection.  Septic protocol has been started  {Document critical care time when appropriate  Document review of labs and clinical decision tools ie CHADS2VASC2, etc  Document your independent review of radiology images and any outside records  Document your discussion with family members, caretakers and with consultants  Document social determinants of health affecting pt's care  Document your decision making why or why not admission, treatments were needed:32947:::1}   Final diagnoses:  None  ED Discharge Orders     None

## 2023-11-04 NOTE — ED Provider Notes (Signed)
 Signout from Dr. Zammit.  83 year old male history of CHF here with general weakness fever dysuria.  Blood pressure soft but usually runs a little low.  Febrile to 102.3 here.  Workup showing a mildly elevated white count.  Has received ceftriaxone .  Anticipate will need admission to the hospital. Physical Exam  BP (!) 89/44   Pulse 86   Temp (!) 102.3 F (39.1 C) (Oral)   Ht 6' (1.829 m)   Wt 131 kg   SpO2 95%   BMI 39.17 kg/m   Physical Exam  Procedures  Procedures  ED Course / MDM    Medical Decision Making Amount and/or Complexity of Data Reviewed Labs: ordered. Radiology: ordered.  Risk OTC drugs. Decision regarding hospitalization.   1615 - Patient urinalysis consistent with infection.  Blood pressures are in the 90s he is awake and alert not tachycardic.  I have recommended admission to the hospital and he is in agreement.  Hospitalist has been paged.  1630.  Discussed with Dr. Willette [facility] hospitalist who will evaluate patient for admission.     Rickey Ozell BROCKS, MD 11/05/23 1019

## 2023-11-04 NOTE — Assessment & Plan Note (Signed)
-   Monitor for urinary retention -Home medication reviewed, not on any meds for prostate

## 2023-11-04 NOTE — Assessment & Plan Note (Signed)
-   Holding home medication of Farxiga , Januvia -Checking CBG q. ACHS, SSI coverage -Check an A1c

## 2023-11-04 NOTE — Assessment & Plan Note (Signed)
-   Patient is having biventricular ICD, monitoring closely

## 2023-11-04 NOTE — Assessment & Plan Note (Signed)
-   Monitoring closely, cardiac medicines reviewed Will be resumed as patient improves from sepsis

## 2023-11-04 NOTE — Sepsis Progress Note (Signed)
 Sepsis protocol is being followed by [facility].

## 2023-11-04 NOTE — H&P (Addendum)
 History and Physical   Patient: Rickey Rice.                            PCP: Benjamine Aland, MD                    DOB: [birth year]            DOA: 11/04/2023 FMW:[id number]             DOS: 11/04/2023, 4:52 PM  Benjamine Aland, MD  Patient coming from:   HOME  I have personally reviewed patient's medical records, in electronic medical records, including:  [facility], and care everywhere.    Chief Complaint:   Chief Complaint  Patient presents with   Weakness    History of present illness:    Lynwood KANDICE Lenon Mickey. Is a 65 male with history of chronic systolic/diastolic CHF, AICD, LBBB,  NICM, hypothyroidism, DM2, HTN, HLD, BPH with generalized weaknesses, fever, and dysuria.  Patient reports symptoms started the past 2 days progressive getting worse.    ED evaluation: Blood pressure (!) 88/44 >> 96/48 Tmax 102.3 currently 99.2, pulse 74,RR  20, height 6' (1.829 m), weight 131 kg, SpO2 94%.  Labs: CBC WBC 11.1, hgb 11.6, CMP; creatinine 1.54, calcium  8.8, glucose 154, lactic acid 1.2, 0.9,  Respiratory panel-RSV, Influ A/B, COVID all negative  UA: > 500 glucose, negative for ketones, positive nitrites, many bacteria WBC 11-20  Blood and urine cultures has been obtained Patient told to be septic, initiated IV fluid, IV antibiotics of Rocephin     Patient Denies having: Cough, SOB, Chest Pain, Abd pain, N/V/D, headache, dizziness, lightheadedness,  Dysuria, Joint pain, rash, open wounds     Review of Systems: As per HPI, otherwise 10 point review of systems were negative.   ----------------------------------------------------------------------------------------------------------------------  Allergies  Allergen Reactions   Bee Venom Swelling    Swelling on lips and tongue   Demerol  [Meperidine ] Other (See Comments)    Unknown    Sulfa Antibiotics Other (See Comments)    Unknown   Pioglitazone Swelling    Actos  Lip swelling   Shellfish-Derived Products Rash    Shrimp Extract Rash    Only fresh shrimp    Home MEDs:  Prior to Admission medications   Medication Sig Start Date End Date Taking? Authorizing Provider  aspirin  EC 81 MG tablet Take 81 mg by mouth daily.   Yes [provider]  brimonidine  (ALPHAGAN ) 0.2 % ophthalmic solution Place 1 drop into the left eye 3 (three) times daily.   Yes [provider]  carboxymethylcellulose (REFRESH PLUS) 0.5 % SOLN Place 1 drop into the right eye in the morning and at bedtime.   Yes [provider]  carvedilol  (COREG ) 12.5 MG tablet Take 0.5 tablets (6.25 mg total) by mouth 2 (two) times daily with a meal. 05/11/23  Yes [name], MD  CINNAMON PO Take 1,000 mg by mouth daily.   Yes [provider]  dapagliflozin  propanediol (FARXIGA ) 10 MG TABS tablet Take 10 mg by mouth daily.   Yes [provider]  furosemide  (LASIX ) 20 MG tablet Take 1 tablet (20 mg total) by mouth daily. 04/10/23  Yes [name], [name], MD  JANUVIA 100 MG tablet Take 100 mg by mouth daily. 04/28/22  Yes [provider]  levothyroxine  (SYNTHROID , LEVOTHROID) 75 MCG tablet Take 75 mcg by mouth See admin instructions. Take 75 mcg  daily on Monday through Saturday, skip Sundays   Yes [provider]  Magnesium  250 MG CAPS Take 250 mg by mouth daily.   Yes [provider]  Multiple Vitamins-Minerals (ICAPS AREDS 2) CAPS Take 1 capsule by mouth 2 (two) times daily.   Yes [provider]  Omega-3 Fatty Acids (OMEGA 3 PO) Take 1 capsule by mouth 2 (two) times daily.   Yes [provider]  rosuvastatin  (CRESTOR ) 20 MG tablet Take 20 mg by mouth daily. 12/09/21  Yes [provider]  sacubitril -valsartan  (ENTRESTO ) 97-103 MG Take 1/2 tablet by mouth twice daily Patient taking differently: Take 0.5 tablets by mouth 2 (two) times daily. Take 1/2 tablet by mouth twice daily 02/27/23  Yes [name], MD  spironolactone  (ALDACTONE ) 25 MG tablet TAKE  1/2 TABLET(12.5 MG) BY MOUTH DAILY Patient taking differently: Take 12.5 mg by mouth daily. TAKE 1/2 TABLET(12.5 MG) BY MOUTH DAILY 06/04/23  Yes [name], [name], MD  TURMERIC PO Take 1 tablet by mouth 2 (two) times daily.   Yes [provider]  glucose blood test strip USE AS DIRECTED TO CHECK BLOOD SUGAR BID 01/21/18   [provider]    PRN MEDs: acetaminophen  **OR** acetaminophen , bisacodyl , hydrALAZINE , HYDROmorphone  (DILAUDID ) injection, ipratropium, levalbuterol , ondansetron  **OR** ondansetron  (ZOFRAN ) IV, oxyCODONE , senna-docusate, sodium phosphate, traZODone   Past Medical History:  Diagnosis Date   AICD (automatic cardioverter/defibrillator) present    Arthritis    oa   Cardiac arrest (HCC) 06/07/2014   Primary VF arrest with successful resuscitation and s/p ICD implant   Diabetes mellitus without complication (HCC)    type 2   Dyslipidemia    History of nuclear stress test 04/2012   lexiscan  - 2 day protocol; low risk study, evidence of inferrior & apical scar but no ischemia    Hypertension    Hypothyroidism    LBBB (left bundle branch block)    Morbid obesity (HCC)    NICM (nonischemic cardiomyopathy) (HCC)    Prostate enlargement    pt denies    Past Surgical History:  Procedure Laterality Date   BI-VENTRICULAR IMPLANTABLE CARDIOVERTER DEFIBRILLATOR N/A 06/18/2014   STJ CRTD implanted by Dr Fernande   BIV ICD GENERATOR CHANGEOUT N/A 07/17/2023   Procedure: BIV ICD GENERATOR CHANGEOUT;  Surgeon: [name], MD;  Location[facility] INVASIVE CV LAB;  Service: Cardiovascular;  Laterality: N/A;   COLONOSCOPY W/ BIOPSIES     COLONOSCOPY WITH PROPOFOL  N/A 04/05/2017   Procedure: COLONOSCOPY WITH PROPOFOL ;  Surgeon: Kristie Lamprey, MD;  Location: [facility] ENDOSCOPY;  Service: Endoscopy;  Laterality: N/A;   FINGER SURGERY  1954   1 st joint  right hand amputated   KNEE ARTHROSCOPY Left    LEFT HEART CATHETERIZATION WITH CORONARY ANGIOGRAM N/A 06/12/2014   Procedure:  LEFT HEART CATHETERIZATION WITH CORONARY ANGIOGRAM;  Surgeon: Alm LELON Clay, MD;  Location: [facility] CATH LAB;  Service: Cardiovascular;  Laterality: N/A;   MOUTH SURGERY  1963   TOTAL KNEE ARTHROPLASTY Right 02/05/2013   Procedure: TOTAL KNEE ARTHROPLASTY;  Surgeon: Dempsey JINNY Sensor, MD;  Location: [facility] OR;  Service: Orthopedics;  Laterality: Right;   TOTAL KNEE ARTHROPLASTY Left 01/19/2014   Procedure: LEFT TOTAL KNEE ARTHROPLASTY;  Surgeon: Dempsey JINNY Sensor, MD;  Location: [facility] OR;  Service: Orthopedics;  Laterality: Left;     reports that he has never smoked. He has never used smokeless tobacco. He reports current alcohol use. He reports that he does not use drugs.   Family History  Problem Relation Age of Onset   Cancer Mother    Kidney disease Brother    Hypertension Sister     Physical Exam:   Vitals:   11/04/23 1341 11/04/23 1342 11/04/23 1346 11/04/23 1600  BP: (!) 89/44   (!) 96/48  Pulse: 86   74  Resp:    20  Temp:  (!) 102.3 F (39.1 C)  99.2 F (37.3 C)  TempSrc:  Oral  Oral  SpO2: 95%   94%  Weight:   131 kg   Height:   6' (1.829 m)    Constitutional: NAD, calm, comfortable Eyes: PERRL, lids and conjunctivae normal ENMT: Mucous membranes are moist. Posterior pharynx clear of any exudate or lesions.Normal dentition.  Neck: normal, supple, no masses, no thyromegaly Respiratory: clear to auscultation bilaterally, no wheezing, no crackles. Normal respiratory effort. No accessory muscle use.  Cardiovascular: Regular rate and rhythm, no murmurs / rubs / gallops. No extremity edema. 2+ pedal pulses. No carotid bruits.  Abdomen: no tenderness, no masses palpated. No hepatosplenomegaly. Bowel sounds positive.  Musculoskeletal: no clubbing / cyanosis. No joint deformity upper and lower extremities. Good ROM, no contractures. Normal muscle tone.  Neurologic: CN II-XII grossly intact. Sensation intact, DTR normal. Strength 5/5 in all 4.  Psychiatric: Normal judgment and insight. Alert  and oriented x 3. Normal mood.  Skin: no rashes, lesions, ulcers. No induration   Labs on admission:    I have personally reviewed following labs and imaging studies  CBC: Recent Labs  Lab 11/04/23 1415  WBC 11.1*  NEUTROABS 9.0*  HGB 11.6*  HCT 36.1*  MCV 102.0*  PLT 146*   Basic Metabolic Panel: Recent Labs  Lab 11/04/23 1415  NA 142  K 4.0  CL 107  CO2 24  GLUCOSE 154*  BUN 20  CREATININE 1.54*  CALCIUM  8.8*  MG 2.2  PHOS 2.3*   GFR: Estimated Creatinine Clearance: 50.9 mL/min (A) (by C-G formula based on SCr of 1.54 mg/dL (H)). Liver Function Tests: Recent Labs  Lab 11/04/23 1415  AST 16  ALT 13  ALKPHOS 58  BILITOT 1.0  PROT 6.8  ALBUMIN  3.4*   No results for input(s): LIPASE, AMYLASE in the last 168 hours. No results for input(s): AMMONIA in the last 168 hours. Coagulation Profile: Recent Labs  Lab 11/04/23 1415  INR 1.1    Urine analysis:    Component Value Date/Time   COLORURINE YELLOW 11/04/2023 1453   APPEARANCEUR CLEAR 11/04/2023 1453   LABSPEC 1.016 11/04/2023 1453   PHURINE 5.0 11/04/2023 1453   GLUCOSEU >=500 (A) 11/04/2023 1453   HGBUR NEGATIVE 11/04/2023 1453   BILIRUBINUR NEGATIVE 11/04/2023 1453   KETONESUR NEGATIVE 11/04/2023 1453   PROTEINUR NEGATIVE 11/04/2023 1453   UROBILINOGEN 0.2 06/14/2014 0318   NITRITE POSITIVE (A) 11/04/2023 1453   LEUKOCYTESUR TRACE (A) 11/04/2023 1453    Last A1C:  Lab Results  Component Value Date   HGBA1C 6.7 (H) 06/11/2014     Radiologic Exams on Admission:   DG Chest Port 1 View Result Date: 11/04/2023 CLINICAL DATA:  Code sepsis.  Hypotension. EXAM: PORTABLE CHEST 1 VIEW COMPARISON:  01/31/2023 and CT chest 06/07/2014. FINDINGS: Trachea is midline. Heart is enlarged, stable. Biventricular pacemaker and ICD lead tips are stable in position. Lungs are low in volume. No airspace consolidation or pleural fluid. IMPRESSION: No acute findings. Electronically Signed   By: Newell Eke M.D.   On: 11/04/2023 14:33    EKG:   Independently reviewed.  Orders placed or performed during the hospital encounter of 11/04/23   ED EKG   ED EKG   EKG 12-Lead   ---------------------------------------------------------------------------------------------------------------------------------------    Assessment / Plan:   Principal Problem:   Sepsis (HCC) Active Problems:   Chronic systolic congestive heart failure/diastolic (HCC)   Benign prostatic hyperplasia   LBBB (left bundle branch block)   DM2 (diabetes mellitus, type 2) (HCC)   Hypothyroidism   Morbid obesity (HCC)   Dyslipidemia   Stiffness of right knee   History of cardiac arrest   NICM (nonischemic cardiomyopathy) (HCC)   Essential hypertension   CKD (chronic kidney disease) stage 2, GFR 60-89 ml/min   VT (ventricular tachycardia) (HCC)   Salivary stone   Biventricular ICD (implantable cardioverter-defibrillator) in place   Assessment and Plan: * Sepsis (HCC) POA: Meeting sepsis criteria with hypotension, fever, AKI Source of infection urinary,  Blood pressure (!) 88/44 >> 96/48 Tmax 102.3 currently 99.2, pulse 74,RR  20,  SpO2 94% on room air  CBC WBC 11.1, hgb  creatinine 1.54,   lactic acid 1.2, 0.9,  UA: > 500 glucose, negative for ketones, positive nitrites, many bacteria WBC 11-20  Patient told to be septic, initiated IV fluid, IV antibiotics  - Initiated on Rocephin  -Per sepsis protocol will continue following lactic acid, IV fluids, IV antibiotics of Rocephin   - Will follow blood/urine cultures  Chronic systolic congestive heart failure/diastolic (HCC) - Patient needs IV fluid resuscitation for sepsis -Monitoring I will try to avoid volume overload -Holding home medication of Lasix  for now -Monitoring I's and O's, daily weight --Home medication reviewed: Aldactone  and Lasix , Farxiga , valsartan  will be on hold-till patient stabilized from sepsis  Benign prostatic hyperplasia -  Monitor for urinary retention -Home medication reviewed, not on any meds for prostate  Biventricular ICD (implantable cardioverter-defibrillator) in place With history of left bundle branch block, -If blood pressure allows continue beta-blockers - Monitoring on telemetry closely -Denies any chest pain or dysrhythmia  CKD (chronic kidney disease) stage 2, GFR 60-89 ml/min Acute on chronic kidney disease -  - Likely worsening BUN/creatinine due to sepsis - Monitoring BUN/creatinine closely -Will avoid nephrotoxins, hypotension  Essential hypertension - Currently hypotensive due to sepsis -Or holding BP medication including Aldactone , Lasix , Entresto ,  NICM (nonischemic cardiomyopathy) (HCC) - Monitoring closely, cardiac medicines reviewed Will be resumed as patient improves from sepsis  Dyslipidemia - Continue statins  Morbid obesity (HCC) Body mass index is 39.17 kg/m. - Discussed with patient follow-up with PCP in future regarding weight loss program  Hypothyroidism - Continue home dose Synthroid   DM2 (diabetes mellitus, type 2) (HCC) - Holding home medication of Farxiga , Januvia -Checking CBG q. ACHS, SSI coverage -Check an A1c  LBBB (left bundle branch block) - Patient is having biventricular ICD, monitoring closely        Consults called:  None -------------------------------------------------------------------------------------------------------------------------------------------- DVT prophylaxis:  heparin  injection 5,000 Units Start: 11/04/23 1630 SCDs Start: 11/04/23 1628   Code Status:   Code Status: Full Code   Admission status: Patient will be admitted as Inpatient, with a greater than 2 midnight length of stay. Level of care: Stepdown   Family Communication:  none at bedside  (The above findings and plan of care has been discussed with patient in detail, the patient expressed understanding and agreement of above plan)   --------------------------------------------------------------------------------------------------------------------------------------------------  Disposition Plan:  Anticipated 1-2 days Status is: Inpatient Remains inpatient appropriate because: Patient meeting sepsis criteria, will need IV fluids, IV antibiotics-medication modifications   ----------------------------------------------------------------------------------------------------------  Critical care Time spent:  42  Min.  Was spent seeing and evaluating the patient, reviewing all medical records, drawn plan of care.  SIGNED: Adriana DELENA Grams, MD, FHM. FAAFP. [facility], Pager  (Please use [email or web address] to page/ or secure chat through epic) If 7PM-7AM, please contact night-coverage [email or web address],  11/04/2023, 4:52 PM

## 2023-11-04 NOTE — Assessment & Plan Note (Signed)
-   Currently hypotensive due to sepsis -Or holding BP medication including Aldactone , Lasix , Entresto ,

## 2023-11-04 NOTE — Assessment & Plan Note (Signed)
 POA: Meeting sepsis criteria with hypotension, fever, AKI Source of infection urinary,  Blood pressure (!) 88/44 >> 96/48 Tmax 102.3 currently 99.2, pulse 74,RR  20,  SpO2 94% on room air  CBC WBC 11.1, hgb  creatinine 1.54,   lactic acid 1.2, 0.9,  UA: > 500 glucose, negative for ketones, positive nitrites, many bacteria WBC 11-20  Patient told to be septic, initiated IV fluid, IV antibiotics  - Initiated on Rocephin  -Per sepsis protocol will continue following lactic acid, IV fluids, IV antibiotics of Rocephin   - Will follow blood/urine cultures

## 2023-11-04 NOTE — Assessment & Plan Note (Signed)
 Acute on chronic kidney disease -  - Likely worsening BUN/creatinine due to sepsis - Monitoring BUN/creatinine closely -Will avoid nephrotoxins, hypotension

## 2023-11-05 ENCOUNTER — Other Ambulatory Visit: Payer: Self-pay | Admitting: Internal Medicine

## 2023-11-05 DIAGNOSIS — R6520 Severe sepsis without septic shock: Secondary | ICD-10-CM | POA: Diagnosis not present

## 2023-11-05 DIAGNOSIS — I959 Hypotension, unspecified: Secondary | ICD-10-CM | POA: Diagnosis not present

## 2023-11-05 DIAGNOSIS — A419 Sepsis, unspecified organism: Secondary | ICD-10-CM | POA: Diagnosis not present

## 2023-11-05 DIAGNOSIS — N179 Acute kidney failure, unspecified: Secondary | ICD-10-CM | POA: Diagnosis not present

## 2023-11-05 LAB — COMPREHENSIVE METABOLIC PANEL WITH GFR
ALT: 10 U/L (ref 0–44)
AST: 13 U/L — ABNORMAL LOW (ref 15–41)
Albumin: 2.8 g/dL — ABNORMAL LOW (ref 3.5–5.0)
Alkaline Phosphatase: 43 U/L (ref 38–126)
Anion gap: 8 (ref 5–15)
BUN: 20 mg/dL (ref 8–23)
CO2: 25 mmol/L (ref 22–32)
Calcium: 8.1 mg/dL — ABNORMAL LOW (ref 8.9–10.3)
Chloride: 110 mmol/L (ref 98–111)
Creatinine, Ser: 1.5 mg/dL — ABNORMAL HIGH (ref 0.61–1.24)
GFR, Estimated: 46 mL/min — ABNORMAL LOW (ref >60)
Glucose, Bld: 107 mg/dL — ABNORMAL HIGH (ref 70–99)
Potassium: 3.6 mmol/L (ref 3.5–5.1)
Sodium: 143 mmol/L (ref 135–145)
Total Bilirubin: 0.8 mg/dL (ref 0.0–1.2)
Total Protein: 5.6 g/dL — ABNORMAL LOW (ref 6.5–8.1)

## 2023-11-05 LAB — IRON AND TIBC
Iron: 17 ug/dL — ABNORMAL LOW (ref 45–182)
Saturation Ratios: 6 % — ABNORMAL LOW (ref 17.9–39.5)
TIBC: 270 ug/dL (ref 250–450)
UIBC: 253 ug/dL

## 2023-11-05 LAB — CBC WITH DIFFERENTIAL/PLATELET
Abs Immature Granulocytes: 0.04 K/uL (ref 0.00–0.07)
Basophils Absolute: 0 K/uL (ref 0.0–0.1)
Basophils Relative: 0 %
Eosinophils Absolute: 0.1 K/uL (ref 0.0–0.5)
Eosinophils Relative: 1 %
HCT: 31 % — ABNORMAL LOW (ref 39.0–52.0)
Hemoglobin: 9.8 g/dL — ABNORMAL LOW (ref 13.0–17.0)
Immature Granulocytes: 0 %
Lymphocytes Relative: 11 %
Lymphs Abs: 1.2 K/uL (ref 0.7–4.0)
MCH: 32.5 pg (ref 26.0–34.0)
MCHC: 31.6 g/dL (ref 30.0–36.0)
MCV: 102.6 fL — ABNORMAL HIGH (ref 80.0–100.0)
Monocytes Absolute: 1.5 K/uL — ABNORMAL HIGH (ref 0.1–1.0)
Monocytes Relative: 13 %
Neutro Abs: 8 K/uL — ABNORMAL HIGH (ref 1.7–7.7)
Neutrophils Relative %: 75 %
Platelets: 126 K/uL — ABNORMAL LOW (ref 150–400)
RBC: 3.02 MIL/uL — ABNORMAL LOW (ref 4.22–5.81)
RDW: 13.9 % (ref 11.5–15.5)
WBC: 10.9 K/uL — ABNORMAL HIGH (ref 4.0–10.5)
nRBC: 0 % (ref 0.0–0.2)

## 2023-11-05 LAB — GLUCOSE, CAPILLARY
Glucose-Capillary: 104 mg/dL — ABNORMAL HIGH (ref 70–99)
Glucose-Capillary: 130 mg/dL — ABNORMAL HIGH (ref 70–99)
Glucose-Capillary: 116 mg/dL — ABNORMAL HIGH (ref 70–99)

## 2023-11-05 LAB — FOLATE: Folate: 14.1 ng/mL (ref >5.9)

## 2023-11-05 LAB — BRAIN NATRIURETIC PEPTIDE: B Natriuretic Peptide: 171 pg/mL — ABNORMAL HIGH (ref 0.0–100.0)

## 2023-11-05 LAB — VITAMIN B12: Vitamin B-12: 151 pg/mL — ABNORMAL LOW (ref 180–914)

## 2023-11-05 MED ORDER — INSULIN ASPART 100 UNIT/ML IJ SOLN
0.0000 [IU] | Freq: Three times a day (TID) | INTRAMUSCULAR | Status: DC
  Administered 2023-11-05 – 2023-11-07 (×6): 1 [IU] via SUBCUTANEOUS

## 2023-11-05 MED ORDER — MAGNESIUM OXIDE -MG SUPPLEMENT 400 (240 MG) MG PO TABS
400.0000 mg | ORAL_TABLET | Freq: Every day | ORAL | Status: DC
  Administered 2023-11-05 – 2023-11-07 (×6): 400 mg via ORAL
  Filled 2023-11-05 (×3): qty 1

## 2023-11-05 NOTE — TOC CM/SW Note (Signed)
 Transition of Care [facility]) - Inpatient Brief Assessment   Patient Details  Name: Rickey Rice. MRN: [id number] Date of Birth: 19-Mar-1941  Transition of Care [facility]) CM/SW Contact:    Lucie Lunger, LCSWA Phone Number: 11/05/2023, 8:44 AM   Clinical Narrative: Transition of Care Department [facility]) has reviewed patient and no TOC needs have been identified at this time. We will continue to monitor patient advancement through interdiciplinary progression rounds. If new patient transition needs arise, please place a TOC consult.  Transition of Care Asessment: Insurance and Status: Insurance coverage has been reviewed Patient has primary care physician: Yes Home environment has been reviewed: From home Prior level of function:: Independent Prior/Current Home Services: No current home services Social Drivers of Health Review: SDOH reviewed no interventions necessary Readmission risk has been reviewed: Yes Transition of care needs: no transition of care needs at this time

## 2023-11-05 NOTE — Progress Notes (Signed)
   11/05/23 0800  ReDS Vest / Clip  Station Marker C  Ruler Value 31  ReDS Value Range < 36  ReDS Actual Value 26  Anatomical Comments patient sitting up in chair

## 2023-11-05 NOTE — Progress Notes (Signed)
 Mobility Specialist Progress Note:    11/05/23 1430  Mobility  Activity Ambulated with assistance  Level of Assistance Standby assist, set-up cues, supervision of patient - no hands on  Assistive Device Cane  Distance Ambulated (ft) 20 ft  Range of Motion/Exercises Active;All extremities  Activity Response Tolerated well  Mobility Referral Yes  Mobility visit 1 Mobility  Mobility Specialist Start Time (ACUTE ONLY) 1415  Mobility Specialist Stop Time (ACUTE ONLY) 1430  Mobility Specialist Time Calculation (min) (ACUTE ONLY) 15 min   Pt received requesting assistance. Required SBA to stand and ambulate with cane. Tolerated well, asx throughout. Left pt supine, all needs met.   Sherrilee Ditty Mobility Specialist Please contact via Special educational needs teacher or  Rehab office at [phone]

## 2023-11-05 NOTE — Progress Notes (Signed)
 PROGRESS NOTE    Patient: Rickey Rice.                            PCP: Benjamine Aland, MD                    DOB: Jul 07, 1940            DOA: 11/04/2023 FMW:[id number]             DOS: 11/05/2023, 10:47 AM   LOS: 1 day   Date of Service: The patient was seen and examined on 11/05/2023  Subjective:   The patient was seen and examined this morning, stable.  Reporting doing much today. Blood pressures improved, no issues overnight Tmax 102.3, this morning 99.3,, satting 96% on room air  Brief Narrative:   Lynwood KANDICE Lenon Mickey. Is a 44 male with history of chronic systolic/diastolic CHF, AICD, LBBB,  NICM, hypothyroidism, DM2, HTN, HLD, BPH with generalized weaknesses, fever, and dysuria.  Patient reports symptoms started the past 2 days progressive getting worse.  ED evaluation: Blood pressure (!) 88/44 >> 96/48 Tmax 102.3 currently 99.2, pulse 74,RR  20, height 6' (1.829 m), weight 131 kg, SpO2 94%.  Labs: CBC WBC 11.1, hgb 11.6, CMP; creatinine 1.54, calcium  8.8, glucose 154, lactic acid 1.2, 0.9,  Respiratory panel-RSV, Influ A/B, COVID all negative  UA: > 500 glucose, negative for ketones, positive nitrites, many bacteria WBC 11-20  Blood and urine cultures has been obtained Patient told to be septic, initiated IV fluid, IV antibiotics of Rocephin     Assessment & Plan:   Principal Problem:   Sepsis (HCC) Active Problems:   Chronic systolic congestive heart failure/diastolic (HCC)   Benign prostatic hyperplasia   LBBB (left bundle branch block)   DM2 (diabetes mellitus, type 2) (HCC)   Hypothyroidism   Morbid obesity (HCC)   Dyslipidemia   Stiffness of right knee   History of cardiac arrest   NICM (nonischemic cardiomyopathy) (HCC)   Essential hypertension   CKD (chronic kidney disease) stage 2, GFR 60-89 ml/min   VT (ventricular tachycardia) (HCC)   Salivary stone   Biventricular ICD (implantable cardioverter-defibrillator) in place     Assessment and  Plan: * Sepsis (HCC) -Improving sepsis physiology, blood pressure stabilizing, still tachypneic, w RR 23 Temp this morning 99.3, satting 96% room air Awake alert oriented following commands  POA: Meeting sepsis criteria with hypotension, fever, AKI Source of infection urinary, POA: Blood pressure (!) 88/44 Tmax 102.3  pulse 74,RR  20,  SpO2 94% on room air  CBC WBC 11.1, hgb  creatinine 1.54,   lactic acid 1.2, 0.9,  UA: > 500 glucose, negative for ketones, positive nitrites, many bacteria WBC 11-20 - IV antibiotics, IVF per sepsis protocol  -S/p IV fluid resuscitation, -Discontinuing IV fluids, to avoid volume overload due to history of CHF -Continue current IV antibiotics of Rocephin   -Blood cultures>> no growth to date -Urine cultures >>>>   Chronic systolic congestive heart failure/diastolic (HCC) - -Per sepsis protocol s/p IV fluid resuscitation -Discontinuing IVF to avoid volume overload - ReDs Clip 26  - BNP 171.0  - Monitoring I's and O's  Intake/Output Summary (Last 24 hours) at 11/05/2023 1051 Last data filed at 11/05/2023 1039 Gross per 24 hour  Intake 3831.36 ml  Output 1000 ml  Net 2831.36 ml    - Once BP stabilizes resuming Lasix   --Home medication reviewed: Aldactone  and Lasix , Farxiga ,  valsartan  will be on hold-till patient stabilized from sepsis  Echo from March 2023 reviewed: EJF, i 40 to 45% but  may be underestimated in setting of significant septal/lateral septal  asynchrony from BBB. Definity  contrast study poor quality. The left  ventricle has mildly decreased function. The   left ventricle demonstrates global hypokinesis. Left ventricular  diastolic parameters are consistent with Grade I diastolic dysfunction  (impaired relaxation).   2. Right ventricular systolic function is normal.    Benign prostatic hyperplasia - Monitor for urinary retention -Home medication reviewed, not on any meds for prostate - Voiding without retention now    Biventricular ICD (implantable cardioverter-defibrillator) in place With history of left bundle branch block, -If blood pressure allows continue beta-blockers - Monitoring on telemetry closely -Denies any chest pain or dysrhythmia  CKD (chronic kidney disease) stage 2, GFR 60-89 ml/min Acute on chronic kidney disease -  - Likely worsening BUN/creatinine due to sepsis - Monitoring BUN/creatinine closely -Will avoid nephrotoxins, hypotension Lab Results  Component Value Date   CREATININE 1.50 (H) 11/05/2023   CREATININE 1.54 (H) 11/04/2023   CREATININE 1.59 (H) 07/11/2023     Essential hypertension -hypotensive - Currently hypotensive due to sepsis -Or holding BP medication including Aldactone , Lasix , Entresto ,  NICM (nonischemic cardiomyopathy) (HCC) - Monitoring closely, cardiac medicines reviewed Will be resumed as patient improves from sepsis  Dyslipidemia - Continue statins  Morbid obesity (HCC) Body mass index is 39.17 kg/m. - Discussed with patient follow-up with PCP in future regarding weight loss program  Hypothyroidism - Continue home dose Synthroid   DM2 (diabetes mellitus, type 2) (HCC) - Holding home medication of Farxiga , Januvia -Checking CBG q. ACHS, SSI coverage -Check an A1c  LBBB (left bundle branch block) - Patient is having biventricular ICD, monitoring closely  ------------------------------------------------------------------------------------------------------------------ Nutritional status:  The patient's BMI is: Body mass index is 38.57 kg/m. I agree with the assessment and plan as outlined ----------------------------------------------------------------------------------------------------------------- Cultures; Blood Cultures x 2 >> Urine Culture  >>>   ------------------------------------------------------------------------------------------------------------------ DVT prophylaxis:  heparin  injection 5,000 Units Start: 11/04/23  1630 SCDs Start: 11/04/23 1628   Code Status:   Code Status: Full Code  Family Communication: Wife present at bedside updated -Advance care planning has been discussed.   Admission status:   Status is: Inpatient Remains inpatient appropriate because: Met sepsis criteria, IV fluid, IV antibiotics   Disposition: From  - home             Planning for discharge in 1-2 days   Procedures:   No admission procedures for hospital encounter.   Antimicrobials:  Anti-infectives (From admission, onward)    Start     Dose/Rate Route Frequency Ordered Stop   11/04/23 1630  cefTRIAXone  (ROCEPHIN ) 2 g in sodium chloride  0.9 % 100 mL IVPB        2 g 200 mL/hr over 30 Minutes Intravenous Every 24 hours 11/04/23 1629 11/11/23 1629   11/04/23 1400  cefTRIAXone  (ROCEPHIN ) 2 g in sodium chloride  0.9 % 100 mL IVPB        2 g 200 mL/hr over 30 Minutes Intravenous  Once 11/04/23 1347 11/04/23 1618        Medication:   aspirin  EC  81 mg Oral Daily   brimonidine   1 drop Left Eye TID   carvedilol   6.25 mg Oral BID WC   Chlorhexidine  Gluconate Cloth  6 each Topical Q0600   dapagliflozin  propanediol  10 mg Oral Daily   heparin   5,000 Units Subcutaneous Q8H  insulin  aspart  0-9 Units Subcutaneous TID WC   levothyroxine   75 mcg Oral q AM   magnesium  oxide  400 mg Oral Daily   rosuvastatin   20 mg Oral Daily   sodium chloride  flush  3 mL Intravenous Q12H   sodium chloride  flush  3 mL Intravenous Q12H    acetaminophen  **OR** acetaminophen , bisacodyl , hydrALAZINE , HYDROmorphone  (DILAUDID ) injection, ipratropium, levalbuterol , ondansetron  **OR** ondansetron  (ZOFRAN ) IV, oxyCODONE , senna-docusate, sodium phosphate, traZODone    Objective:   Vitals:   11/05/23 0700 11/05/23 0753 11/05/23 0800 11/05/23 0900  BP: (!) 123/50  (!) 109/48 (!) 116/52  Pulse: 64  69 71  Resp: (!) 22  (!) 23 (!) 23  Temp:  99.3 F (37.4 C)    TempSrc:  Oral    SpO2: 94%  97% 96%  Weight:      Height:         Intake/Output Summary (Last 24 hours) at 11/05/2023 1047 Last data filed at 11/05/2023 1039 Gross per 24 hour  Intake 3831.36 ml  Output 1000 ml  Net 2831.36 ml   Filed Weights   11/04/23 1346 11/04/23 1800 11/05/23 0611  Weight: 131 kg 129.1 kg 129 kg     Physical examination:   General:  AAO x 3,  cooperative, no distress;   HEENT:  Normocephalic, PERRL, otherwise with in Normal limits   Neuro:  CNII-XII intact. , normal motor and sensation, reflexes intact   Lungs:   Clear to auscultation BL, Respirations unlabored,  No wheezes / crackles  Cardio:    S1/S2, RRR, No murmure, No Rubs or Gallops   Abdomen:  Soft, non-tender, bowel sounds active all four quadrants, no guarding or peritoneal signs.  Muscular  skeletal:  Limited exam -global generalized weaknesses - in bed, able to move all 4 extremities,   2+ pulses,  symmetric, No pitting edema  Skin:  Dry, warm to touch, negative for any Rashes,  Wounds: Please see nursing documentation       ------------------------------------------------------------------------------------------------------------------------------------------    LABs:     Latest Ref Rng & Units 11/05/2023    4:48 AM 11/04/2023    2:15 PM 07/11/2023   10:40 AM  CBC  WBC 4.0 - 10.5 K/uL 10.9  11.1  4.8   Hemoglobin 13.0 - 17.0 g/dL 9.8  88.3  88.1   Hematocrit 39.0 - 52.0 % 31.0  36.1  36.2   Platelets 150 - 400 K/uL 126  146  147       Latest Ref Rng & Units 11/05/2023    4:48 AM 11/04/2023    2:15 PM 07/11/2023   10:40 AM  CMP  Glucose 70 - 99 mg/dL 892  845  873   BUN 8 - 23 mg/dL 20  20  27    Creatinine 0.61 - 1.24 mg/dL 8.49  8.45  8.40   Sodium 135 - 145 mmol/L 143  142  146   Potassium 3.5 - 5.1 mmol/L 3.6  4.0  4.2   Chloride 98 - 111 mmol/L 110  107  107   CO2 22 - 32 mmol/L 25  24  25    Calcium  8.9 - 10.3 mg/dL 8.1  8.8  9.1   Total Protein 6.5 - 8.1 g/dL 5.6  6.8    Total Bilirubin 0.0 - 1.2 mg/dL 0.8  1.0    Alkaline Phos 38  - 126 U/L 43  58    AST 15 - 41 U/L 13  16    ALT 0 -  44 U/L 10  13         Micro Results Recent Results (from the past 240 hours)  Blood Culture (routine x 2)     Status: None (Preliminary result)   Collection Time: 11/04/23  2:15 PM   Specimen: BLOOD LEFT HAND  Result Value Ref Range Status   Specimen Description   Final    BLOOD LEFT HAND BOTTLES DRAWN AEROBIC AND ANAEROBIC   Special Requests Blood Culture adequate volume  Final   Culture   Final    NO GROWTH < 24 HOURS Performed at [facility], [address], [place], [place] [address]    Report Status PENDING  Incomplete  Resp panel by RT-PCR (RSV, Flu A&B, Covid) Anterior Nasal Swab     Status: None   Collection Time: 11/04/23  2:18 PM   Specimen: Anterior Nasal Swab  Result Value Ref Range Status   SARS Coronavirus 2 by RT PCR NEGATIVE NEGATIVE Final    Comment: (NOTE) SARS-CoV-2 target nucleic acids are NOT DETECTED.  The SARS-CoV-2 RNA is generally detectable in upper respiratory specimens during the acute phase of infection. The lowest concentration of SARS-CoV-2 viral copies this assay can detect is 138 copies/mL. A negative result does not preclude SARS-Cov-2 infection and should not be used as the sole basis for treatment or other patient management decisions. A negative result may occur with  improper specimen collection/handling, submission of specimen other than nasopharyngeal swab, presence of viral mutation(s) within the areas targeted by this assay, and inadequate number of viral copies(<138 copies/mL). A negative result must be combined with clinical observations, patient history, and epidemiological information. The expected result is Negative.  Fact Sheet for Patients:  BloggerCourse.com  Fact Sheet for Healthcare Providers:  SeriousBroker.it  This test is no t yet approved or cleared by the [place]  FDA and  has been authorized for  detection and/or diagnosis of SARS-CoV-2 by FDA under an Emergency Use Authorization (EUA). This EUA will remain  in effect (meaning this test can be used) for the duration of the COVID-19 declaration under Section 564(b)(1) of the Act, 21 U.S.C.section 360bbb-3(b)(1), unless the authorization is terminated  or revoked sooner.       Influenza A by PCR NEGATIVE NEGATIVE Final   Influenza B by PCR NEGATIVE NEGATIVE Final    Comment: (NOTE) The Xpert Xpress SARS-CoV-2/FLU/RSV plus assay is intended as an aid in the diagnosis of influenza from Nasopharyngeal swab specimens and should not be used as a sole basis for treatment. Nasal washings and aspirates are unacceptable for Xpert Xpress SARS-CoV-2/FLU/RSV testing.  Fact Sheet for Patients: BloggerCourse.com  Fact Sheet for Healthcare Providers: SeriousBroker.it  This test is not yet approved or cleared by the [place]  FDA and has been authorized for detection and/or diagnosis of SARS-CoV-2 by FDA under an Emergency Use Authorization (EUA). This EUA will remain in effect (meaning this test can be used) for the duration of the COVID-19 declaration under Section 564(b)(1) of the Act, 21 U.S.C. section 360bbb-3(b)(1), unless the authorization is terminated or revoked.     Resp Syncytial Virus by PCR NEGATIVE NEGATIVE Final    Comment: (NOTE) Fact Sheet for Patients: BloggerCourse.com  Fact Sheet for Healthcare Providers: SeriousBroker.it  This test is not yet approved or cleared by the [place]  FDA and has been authorized for detection and/or diagnosis of SARS-CoV-2 by FDA under an Emergency Use Authorization (EUA). This EUA will remain in effect (meaning this test can be used) for the  duration of the COVID-19 declaration under Section 564(b)(1) of the Act, 21 U.S.C. section 360bbb-3(b)(1), unless the authorization is  terminated or revoked.  Performed at [facility], [address] [place], [place] [address]   Blood Culture (routine x 2)     Status: None (Preliminary result)   Collection Time: 11/04/23  2:32 PM   Specimen: BLOOD RIGHT HAND  Result Value Ref Range Status   Specimen Description   Final    BLOOD RIGHT HAND BOTTLES DRAWN AEROBIC AND ANAEROBIC   Special Requests Blood Culture adequate volume  Final   Culture   Final    NO GROWTH < 24 HOURS Performed at [facility], [address] [place], [place] [address]    Report Status PENDING  Incomplete  MRSA Next Gen by PCR, Nasal     Status: None   Collection Time: 11/04/23  4:42 PM   Specimen: Nasal Mucosa; Nasal Swab  Result Value Ref Range Status   MRSA by PCR Next Gen NOT DETECTED NOT DETECTED Final    Comment: (NOTE) The GeneXpert MRSA Assay (FDA approved for NASAL specimens only), is one component of a comprehensive MRSA colonization surveillance program. It is not intended to diagnose MRSA infection nor to guide or monitor treatment for MRSA infections. Test performance is not FDA approved in patients less than 45 years old. Performed at [facility], [address] [place], [place] [address]   Culture, blood (x 2)     Status: None (Preliminary result)   Collection Time: 11/04/23  5:26 PM   Specimen: BLOOD  Result Value Ref Range Status   Specimen Description BLOOD RIGHT ANTECUBITAL  Final   Special Requests   Final    BOTTLES DRAWN AEROBIC AND ANAEROBIC Blood Culture adequate volume   Culture   Final    NO GROWTH < 24 HOURS Performed at [facility], [address] [place], [place] [address]    Report Status PENDING  Incomplete  Culture, blood (x 2)     Status: None (Preliminary result)   Collection Time: 11/04/23  5:26 PM   Specimen: BLOOD  Result Value Ref Range Status   Specimen Description BLOOD BLOOD RIGHT HAND  Final   Special Requests   Final    BOTTLES DRAWN AEROBIC AND ANAEROBIC Blood Culture adequate volume    Culture   Final    NO GROWTH < 24 HOURS Performed at [facility], [address] [place], [place] [address]    Report Status PENDING  Incomplete    Radiology Reports DG Chest Port 1 View Result Date: 11/04/2023 CLINICAL DATA:  Code sepsis.  Hypotension. EXAM: PORTABLE CHEST 1 VIEW COMPARISON:  01/31/2023 and CT chest 06/07/2014. FINDINGS: Trachea is midline. Heart is enlarged, stable. Biventricular pacemaker and ICD lead tips are stable in position. Lungs are low in volume. No airspace consolidation or pleural fluid. IMPRESSION: No acute findings. Electronically Signed   By: Newell Eke M.D.   On: 11/04/2023 14:33    SIGNED: Adriana DELENA Grams, MD, FHM. FAAFP. Jolynn Pack - [facility] Time spent - 55 min.  In seeing, evaluating and examining the patient. Reviewing medical records, labs, drawn plan of care. [facility],  Pager (please use [email or web address] to page/ text) Please use Epic Secure Chat for non-urgent communication (7AM-7PM)  If 7PM-7AM, please contact night-coverage [email or web address], 11/05/2023, 10:47 AM

## 2023-11-05 NOTE — Plan of Care (Signed)
  Problem: Acute Rehab OT Goals (only OT should resolve) Goal: Pt. Will Transfer To Toilet Flowsheets (Taken 11/05/2023 1145) Pt Will Transfer to Toilet: with modified independence Goal: Pt/Caregiver Will Perform Home Exercise Program Flowsheets (Taken 11/05/2023 1145) Pt/caregiver will Perform Home Exercise Program:  Increased strength  Increased ROM  Independently  Both right and left upper extremity  Genell Thede OT, MOT

## 2023-11-05 NOTE — Evaluation (Signed)
 Physical Therapy Evaluation Patient Details Name: Rickey Rice. MRN: [id number] DOB: 1940-09-29 Today's Date: 11/05/2023  History of Present Illness  Rickey Rice. Is a 89 male with history of chronic systolic/diastolic CHF, AICD, LBBB,  NICM, hypothyroidism, DM2, HTN, HLD, BPH with generalized weaknesses, fever, and dysuria.  Patient reports symptoms started the past 2 days progressive getting worse.   Clinical Impression  Patient required HOB partially raised and increased time for sitting up at bedside, demonstrates good return for using his cane for transfers and ambulating in room/hallways without loss of balance and tolerated sitting up in chair after therapy with his spouse present. Patient will benefit from continued skilled physical therapy in hospital and recommended venue below to increase strength, balance, endurance for safe ADLs and gait.         If plan is discharge home, recommend the following: A little help with walking and/or transfers;A little help with bathing/dressing/bathroom;Help with stairs or ramp for entrance;Assistance with cooking/housework   Can travel by private vehicle        Equipment Recommendations None recommended by PT  Recommendations for Other Services       Functional Status Assessment Patient has had a recent decline in their functional status and demonstrates the ability to make significant improvements in function in a reasonable and predictable amount of time.     Precautions / Restrictions Precautions Precautions: None Restrictions Weight Bearing Restrictions Per Provider Order: No      Mobility  Bed Mobility Overal bed mobility: Modified Independent             General bed mobility comments: increased time, HOB partially raised    Transfers Overall transfer level: Needs assistance Equipment used: Straight cane Transfers: Sit to/from Stand, Bed to chair/wheelchair/BSC Sit to Stand: Modified independent  (Device/Increase time), Supervision   Step pivot transfers: Modified independent (Device/Increase time), Supervision       General transfer comment: good return for transferring to chair using his [facility]    Ambulation/Gait Ambulation/Gait assistance: Supervision Gait Distance (Feet): 100 Feet Assistive device: Straight cane Gait Pattern/deviations: Step-to pattern, Decreased step length - right, Decreased step length - left, Decreased stride length Gait velocity: decreased     General Gait Details: slightly labored movement with mostly step-to pattern using his cane without loss of balance ambulating in room, hallways  Stairs            Wheelchair Mobility     Tilt Bed    Modified Rankin (Stroke Patients Only)       Balance Overall balance assessment: Needs assistance Sitting-balance support: Feet supported, No upper extremity supported Sitting balance-Leahy Scale: Good Sitting balance - Comments: seated at EOB   Standing balance support: Single extremity supported, During functional activity Standing balance-Leahy Scale: Fair Standing balance comment: fair/good using SOC                             Pertinent Vitals/Pain Pain Assessment Pain Assessment: No/denies pain    Home Living Family/patient expects to be discharged to:: Private residence Living Arrangements: Spouse/significant other Available Help at Discharge: Family;Available 24 hours/day Type of Home: House Home Access: Stairs to enter Entrance Stairs-Rails: Right Entrance Stairs-Number of Steps: 3 Alternate Level Stairs-Number of Steps: 13 Home Layout: Laundry or work area in basement;Two level Home Equipment: Rollator (4 wheels);Other (comment);Cane - single point;Lift chair;Grab bars - tub/shower      Prior Function Prior Level of Function :  Needs assist;History of Falls (last six months)       Physical Assist : ADLs (physical)   ADLs (physical): IADLs Mobility Comments:  Community ambulator with [facility]; uses cart in the grocery store. ADLs Comments: Independent ADL's; Wife assist as needed with IADL's.     Extremity/Trunk Assessment   Upper Extremity Assessment Upper Extremity Assessment: Defer to OT evaluation    Lower Extremity Assessment Lower Extremity Assessment: Generalized weakness    Cervical / Trunk Assessment Cervical / Trunk Assessment: Kyphotic  Communication   Communication Communication: No apparent difficulties    Cognition Arousal: Alert Behavior During Therapy: WFL for tasks assessed/performed   PT - Cognitive impairments: No apparent impairments                         Following commands: Intact       Cueing Cueing Techniques: Verbal cues     General Comments      Exercises     Assessment/Plan    PT Assessment Patient needs continued PT services  PT Problem List Decreased strength;Decreased activity tolerance;Decreased balance;Decreased mobility       PT Treatment Interventions DME instruction;Gait training;Stair training;Functional mobility training;Therapeutic activities;Therapeutic exercise;Balance training;Patient/family education    PT Goals (Current goals can be found in the Care Plan section)  Acute Rehab PT Goals Patient Stated Goal: return home with family to assist PT Goal Formulation: With patient/family Time For Goal Achievement: 11/08/23 Potential to Achieve Goals: Good    Frequency Min 2X/week     Co-evaluation PT/OT/SLP Co-Evaluation/Treatment: Yes Reason for Co-Treatment: To address functional/ADL transfers PT goals addressed during session: Mobility/safety with mobility;Balance;Proper use of DME OT goals addressed during session: ADL's and self-care       AM-PAC PT 6 Clicks Mobility  Outcome Measure Help needed turning from your back to your side while in a flat bed without using bedrails?: None Help needed moving from lying on your back to sitting on the side of a flat  bed without using bedrails?: A Little Help needed moving to and from a bed to a chair (including a wheelchair)?: None Help needed standing up from a chair using your arms (e.g., wheelchair or bedside chair)?: None Help needed to walk in hospital room?: A Little Help needed climbing 3-5 steps with a railing? : A Little 6 Click Score: 21    End of Session Equipment Utilized During Treatment: Gait belt Activity Tolerance: Patient tolerated treatment well;Patient limited by fatigue Patient left: in chair;with call bell/phone within reach;with family/visitor present Nurse Communication: Mobility status PT Visit Diagnosis: Unsteadiness on feet (R26.81);Other abnormalities of gait and mobility (R26.89);Muscle weakness (generalized) (M62.81)    Time: 9141-9083 PT Time Calculation (min) (ACUTE ONLY): 18 min   Charges:   PT Evaluation $PT Eval Low Complexity: 1 Low PT Treatments $Gait Training: 8-22 mins PT General Charges $$ ACUTE PT VISIT: 1 Visit         12:21 PM, 11/05/23 Rickey Music, MPT Physical Therapist with [facility] [phone] office [phone] mobile phone

## 2023-11-05 NOTE — Plan of Care (Signed)
  Problem: Acute Rehab PT Goals(only PT should resolve) Goal: Pt Will Go Supine/Side To Sit Outcome: Progressing Flowsheets (Taken 11/05/2023 1222) Pt will go Supine/Side to Sit:  Independently  with modified independence Goal: Patient Will Transfer Sit To/From Stand Outcome: Progressing Flowsheets (Taken 11/05/2023 1222) Patient will transfer sit to/from stand:  with modified independence  Independently Goal: Pt Will Transfer Bed To Chair/Chair To Bed Outcome: Progressing Flowsheets (Taken 11/05/2023 1222) Pt will Transfer Bed to Chair/Chair to Bed:  Independently  with modified independence Goal: Pt Will Ambulate Outcome: Progressing Flowsheets (Taken 11/05/2023 1222) Pt will Ambulate:  > 125 feet  with modified independence  with cane   12:23 PM, 11/05/23 Lynwood Music, MPT Physical Therapist with [facility] 336 [phone] office [phone] mobile phone

## 2023-11-05 NOTE — Plan of Care (Signed)

## 2023-11-05 NOTE — Evaluation (Signed)
 Occupational Therapy Evaluation Patient Details Name: Rickey Rice. MRN: [id number] DOB: 08/28/1940 Today's Date: 11/05/2023   History of Present Illness   Rickey Rice. Is a 2 male with history of chronic systolic/diastolic CHF, AICD, LBBB,  NICM, hypothyroidism, DM2, HTN, HLD, BPH with generalized weaknesses, fever, and dysuria.  Patient reports symptoms started the past 2 days progressive getting worse. (per MD)     Clinical Impressions Pt agreeable to OT and PT co-evaluation. Pt near baseline function but did require supervision to mod I level of assist for safety while ambulating. Pt also presents with B UE weakness and a history of falls. No assist needed for seated ADL's per observation and clinical judgement. Pt left in the chair with call bell within reach and family present. Pt will benefit from continued OT in the hospital and recommended venue below to increase strength, balance, and endurance for safe ADL's.        If plan is discharge home, recommend the following:   A little help with walking and/or transfers;Help with stairs or ramp for entrance     Functional Status Assessment   Patient has had a recent decline in their functional status and demonstrates the ability to make significant improvements in function in a reasonable and predictable amount of time.     Equipment Recommendations   None recommended by OT             Precautions/Restrictions   Precautions Precautions: None Restrictions Weight Bearing Restrictions Per Provider Order: No     Mobility Bed Mobility Overal bed mobility: Modified Independent (HOB elevated some)                  Transfers Overall transfer level: Needs assistance Equipment used: Straight cane Transfers: Sit to/from Stand, Bed to chair/wheelchair/BSC Sit to Stand: Modified independent (Device/Increase time), Supervision     Step pivot transfers: Modified independent (Device/Increase  time), Supervision     General transfer comment: EOB to chair with SPC      Balance Overall balance assessment: Needs assistance Sitting-balance support: No upper extremity supported, Feet supported Sitting balance-Leahy Scale: Good Sitting balance - Comments: seated at EOB   Standing balance support: Single extremity supported, During functional activity Standing balance-Leahy Scale: Fair Standing balance comment: fair to good with SPC                           ADL either performed or assessed with clinical judgement   ADL Overall ADL's : Needs assistance/impaired     Grooming: Supervision/safety;Standing               Lower Body Dressing: Modified independent;Sitting/lateral leans   Toilet Transfer: Supervision/safety;Ambulation (cane) Toilet Transfer Details (indicate cue type and reason): Simualted via ambulation with cane in hall and room.         Functional mobility during ADLs: Supervision/safety;Modified independent;Cane General ADL Comments: Able to ambulate around the nurses station with [facility].     Vision Baseline Vision/History: 1 Wears glasses Ability to See in Adequate Light: 1 Impaired Patient Visual Report: No change from baseline Vision Assessment?: No apparent visual deficits     Perception Perception: Not tested       Praxis Praxis: Not tested       Pertinent Vitals/Pain Pain Assessment Pain Assessment: No/denies pain     Extremity/Trunk Assessment Upper Extremity Assessment Upper Extremity Assessment: Generalized weakness (3-/5 bilaterally with full P/ROM R shoulder flexion and  mild restriction with L UE shoulder flexion. Generally weak otherwise.)   Lower Extremity Assessment Lower Extremity Assessment: Defer to PT evaluation   Cervical / Trunk Assessment Cervical / Trunk Assessment: Kyphotic   Communication Communication Communication: No apparent difficulties   Cognition Arousal: Alert Behavior During Therapy:  WFL for tasks assessed/performed Cognition: No apparent impairments                               Following commands: Intact       Cueing  General Comments   Cueing Techniques: Verbal cues                 Home Living Family/patient expects to be discharged to:: Private residence Living Arrangements: Spouse/significant other Available Help at Discharge: Family;Available 24 hours/day Type of Home: House Home Access: Stairs to enter [employer] of Steps: 3 Entrance Stairs-Rails: Right (going up) Home Layout: Laundry or work area in basement;Two level Alternate Level Stairs-Number of Steps: 13 Alternate Level Stairs-Rails: Right;Left (alternating railings) Bathroom Shower/Tub: Producer, television/film/video: Handicapped height Bathroom Accessibility: Yes   Home Equipment: Rollator (4 wheels);Other (comment);Cane - single point;Lift chair;Grab bars - tub/shower (hand held urinal)          Prior Functioning/Environment Prior Level of Function : Needs assist;History of Falls (last six months)       Physical Assist : ADLs (physical)   ADLs (physical): IADLs Mobility Comments: Community ambulator with [facility]; uses cart in the grocery store. ADLs Comments: Independent ADL's; Wife assist as needed with IADL's.    OT Problem List: Decreased strength;Decreased range of motion;Impaired balance (sitting and/or standing)   OT Treatment/Interventions: Self-care/ADL training;Therapeutic exercise;Therapeutic activities;Patient/family education;Balance training      OT Goals(Current goals can be found in the care plan section)   Acute Rehab OT Goals Patient Stated Goal: return home OT Goal Formulation: With patient Time For Goal Achievement: 11/19/23 Potential to Achieve Goals: Good   OT Frequency:  Min 1X/week    Co-evaluation PT/OT/SLP Co-Evaluation/Treatment: Yes Reason for Co-Treatment: To address functional/ADL transfers   OT goals  addressed during session: ADL's and self-care                       End of Session Equipment Utilized During Treatment: Other (comment) [facility])  Activity Tolerance: Patient tolerated treatment well Patient left: in chair;with call bell/phone within reach;with family/visitor present  OT Visit Diagnosis: Unsteadiness on feet (R26.81);Other abnormalities of gait and mobility (R26.89);Muscle weakness (generalized) (M62.81);History of falling (Z91.81)                Time: 9099-9082 OT Time Calculation (min): 17 min Charges:  OT General Charges $OT Visit: 1 Visit OT Evaluation $OT Eval Low Complexity: 1 Low  Rickey Rice OT, MOT   Jayson Person 11/05/2023, 11:42 AM

## 2023-11-06 ENCOUNTER — Inpatient Hospital Stay (HOSPITAL_COMMUNITY)

## 2023-11-06 ENCOUNTER — Telehealth: Payer: Self-pay

## 2023-11-06 DIAGNOSIS — R6520 Severe sepsis without septic shock: Secondary | ICD-10-CM | POA: Diagnosis not present

## 2023-11-06 DIAGNOSIS — N179 Acute kidney failure, unspecified: Secondary | ICD-10-CM | POA: Diagnosis not present

## 2023-11-06 DIAGNOSIS — A419 Sepsis, unspecified organism: Secondary | ICD-10-CM | POA: Diagnosis not present

## 2023-11-06 LAB — CBC WITH DIFFERENTIAL/PLATELET
Abs Immature Granulocytes: 0.03 K/uL (ref 0.00–0.07)
Basophils Absolute: 0 K/uL (ref 0.0–0.1)
Basophils Relative: 0 %
Eosinophils Absolute: 0.1 K/uL (ref 0.0–0.5)
Eosinophils Relative: 2 %
HCT: 32.4 % — ABNORMAL LOW (ref 39.0–52.0)
Hemoglobin: 10.1 g/dL — ABNORMAL LOW (ref 13.0–17.0)
Immature Granulocytes: 1 %
Lymphocytes Relative: 12 %
Lymphs Abs: 0.7 K/uL (ref 0.7–4.0)
MCH: 32.1 pg (ref 26.0–34.0)
MCHC: 31.2 g/dL (ref 30.0–36.0)
MCV: 102.9 fL — ABNORMAL HIGH (ref 80.0–100.0)
Monocytes Absolute: 0.6 K/uL (ref 0.1–1.0)
Monocytes Relative: 11 %
Neutro Abs: 4.1 K/uL (ref 1.7–7.7)
Neutrophils Relative %: 74 %
Platelets: 117 K/uL — ABNORMAL LOW (ref 150–400)
RBC: 3.15 MIL/uL — ABNORMAL LOW (ref 4.22–5.81)
RDW: 13.4 % (ref 11.5–15.5)
WBC: 5.6 K/uL (ref 4.0–10.5)
nRBC: 0 % (ref 0.0–0.2)

## 2023-11-06 LAB — COMPREHENSIVE METABOLIC PANEL WITH GFR
ALT: 11 U/L (ref 0–44)
AST: 15 U/L (ref 15–41)
Albumin: 2.7 g/dL — ABNORMAL LOW (ref 3.5–5.0)
Alkaline Phosphatase: 47 U/L (ref 38–126)
Anion gap: 6 (ref 5–15)
BUN: 18 mg/dL (ref 8–23)
CO2: 25 mmol/L (ref 22–32)
Calcium: 8.1 mg/dL — ABNORMAL LOW (ref 8.9–10.3)
Chloride: 110 mmol/L (ref 98–111)
Creatinine, Ser: 1.23 mg/dL (ref 0.61–1.24)
GFR, Estimated: 58 mL/min — ABNORMAL LOW (ref >60)
Glucose, Bld: 96 mg/dL (ref 70–99)
Potassium: 3.8 mmol/L (ref 3.5–5.1)
Sodium: 141 mmol/L (ref 135–145)
Total Bilirubin: 0.7 mg/dL (ref 0.0–1.2)
Total Protein: 5.7 g/dL — ABNORMAL LOW (ref 6.5–8.1)

## 2023-11-06 LAB — BLOOD CULTURE ID PANEL (REFLEXED) - BCID2

## 2023-11-06 LAB — GLUCOSE, CAPILLARY
Glucose-Capillary: 155 mg/dL — ABNORMAL HIGH (ref 70–99)
Glucose-Capillary: 100 mg/dL — ABNORMAL HIGH (ref 70–99)
Glucose-Capillary: 132 mg/dL — ABNORMAL HIGH (ref 70–99)
Glucose-Capillary: 88 mg/dL (ref 70–99)
Glucose-Capillary: 188 mg/dL — ABNORMAL HIGH (ref 70–99)

## 2023-11-06 LAB — BRAIN NATRIURETIC PEPTIDE: B Natriuretic Peptide: 189 pg/mL — ABNORMAL HIGH (ref 0.0–100.0)

## 2023-11-06 LAB — HEMOGLOBIN A1C
Hgb A1c MFr Bld: 6 % — ABNORMAL HIGH (ref 4.8–5.6)
Mean Plasma Glucose: 126 mg/dL

## 2023-11-06 MED ORDER — GUAIFENESIN 100 MG/5ML PO LIQD
5.0000 mL | ORAL | Status: DC | PRN
  Administered 2023-11-06 (×4): 5 mL via ORAL
  Filled 2023-11-06 (×3): qty 5

## 2023-11-06 MED ORDER — FUROSEMIDE 10 MG/ML IJ SOLN
40.0000 mg | Freq: Two times a day (BID) | INTRAMUSCULAR | Status: AC
  Administered 2023-11-06 (×2): 40 mg via INTRAVENOUS
  Administered 2023-11-06 (×2): 20 mg via INTRAVENOUS
  Filled 2023-11-06 (×2): qty 4

## 2023-11-06 MED ORDER — GUAIFENESIN-DM 100-10 MG/5ML PO SYRP
10.0000 mL | ORAL_SOLUTION | Freq: Four times a day (QID) | ORAL | Status: DC
  Administered 2023-11-06 – 2023-11-07 (×10): 10 mL via ORAL
  Filled 2023-11-06 (×5): qty 10

## 2023-11-06 NOTE — TOC Initial Note (Signed)
 Transition of Care (TOC) - Initial/Assessment Note    Patient Details  Name: Rickey Rice. MRN: [id number] Date of Birth: 1940/05/05  Transition of [facility]) CM/SW Contact:    Lucie Lunger, LCSWA Phone Number: 11/06/2023, 10:39 AM  Clinical Narrative:                 CSW updated that PT is recommending HH PT for pt. CSW spoke with pt to review, he is agreeable to [facility] being set up and does not have an agency preference. CSW sent referral to [facility] with [facility] for review. TOC to follow.   Expected Discharge Plan: Home w Home Health Services Barriers to Discharge: Continued Medical Work up   Patient Goals and CMS Choice Patient states their goals for this hospitalization and ongoing recovery are:: get better CMS Medicare.gov Compare Post Acute Care list provided to:: Patient Choice offered to / list presented to : Patient      Expected Discharge Plan and Services In-house Referral: Clinical Social Work Discharge Planning Services: CM Consult Post Acute Care Choice: Home Health Living arrangements for the past 2 months: Single Family Home                           HH Arranged: PT HH Agency: [facility] Date [facility] Agency Contacted: 11/06/23   Representative spoke with at [facility] Agency: [name]  Prior Living Arrangements/Services Living arrangements for the past 2 months: Single Family Home Lives with:: Spouse Patient language and need for interpreter reviewed:: Yes Do you feel safe going back to the place where you live?: Yes      Need for Family Participation in Patient Care: Yes (Comment) Care giver support system in place?: Yes (comment)   Criminal Activity/Legal Involvement Pertinent to Current Situation/Hospitalization: No - Comment as needed  Activities of Daily Living   ADL Screening (condition at time of admission) Independently performs ADLs?: Yes (appropriate for developmental age) Is the patient deaf or have difficulty hearing?: No Does the  patient have difficulty seeing, even when wearing glasses/contacts?: No Does the patient have difficulty concentrating, remembering, or making decisions?: No  Permission Sought/Granted                  Emotional Assessment Appearance:: Appears stated age Attitude/Demeanor/Rapport: Engaged Affect (typically observed): Accepting Orientation: : Oriented to Self, Oriented to Place, Oriented to  Time, Oriented to Situation Alcohol / Substance Use: Not Applicable Psych Involvement: No (comment)  Admission diagnosis:  Sepsis (HCC) [A41.9] Patient Active Problem List   Diagnosis Date Noted   Sepsis (HCC) 11/04/2023   Biventricular ICD (implantable cardioverter-defibrillator) in place 09/12/2020   OSA (obstructive sleep apnea) 12/19/2018   Salivary stone 02/01/2018   Sensorineural hearing loss (SNHL), bilateral 05/16/2017   VT (ventricular tachycardia) (HCC) 11/12/2015   Chronic systolic congestive heart failure/diastolic (HCC) 09/24/2015   CKD (chronic kidney disease) stage 2, GFR 60-89 ml/min 06/11/2014   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) 06/11/2014   Benign prostatic hyperplasia 06/11/2014   Obesity, morbid (HCC) 06/11/2014   Essential hypertension    NICM (nonischemic cardiomyopathy) (HCC) 06/08/2014   History of cardiac arrest    PEA (Pulseless electrical activity) (HCC) 06/07/2014   Degenerative arthritis of left knee 01/19/2014   Arthritis of left knee 01/19/2014   Stiffness of right knee 03/12/2013   Right leg weakness 03/12/2013   Osteoarthritis of right knee 02/06/2013   LBBB (left bundle branch block)  01/07/2013   DM2 (diabetes mellitus, type 2) (HCC) 01/07/2013   Hypothyroidism 01/07/2013   Morbid obesity (HCC) 01/07/2013   Dyslipidemia 01/07/2013   PCP:  Benjamine Aland, MD Pharmacy:   [facility] [phone] - [place], [place] - [address] AT SEC OF [address] & [address] [place] [address] Phone: [phone] Fax:  [phone]     Social Drivers of Health (SDOH) Social History: SDOH Screenings   Food Insecurity: No Food Insecurity (11/04/2023)  Housing: Low Risk  (11/04/2023)  Transportation Needs: No Transportation Needs (11/04/2023)  Utilities: Not At Risk (11/04/2023)  Social Connections: Moderately Integrated (11/04/2023)  Tobacco Use: Low Risk  (11/04/2023)   SDOH Interventions:     Readmission Risk Interventions     No data to display

## 2023-11-06 NOTE — Telephone Encounter (Signed)
 Received call from patient.  He is currently hospitalized for UTI and reports diuretics are on hold at this time until he is discharged.  He is feeling better and hopes to be discharged tomorrow. Advised ICM remote transmission scheduled for 8/18 and will check fluid levels.  He has appt with Dr Mona 8/20.  Appreciated the update and will follow up 8/18.

## 2023-11-06 NOTE — Progress Notes (Signed)
 Occupational Therapy Treatment Patient Details Name: Rickey Rice. MRN: [id number] DOB: 18-Jun-1940 Today's Date: 11/06/2023   History of present illness Rickey Rice. Is a 36 male with history of chronic systolic/diastolic CHF, AICD, LBBB,  NICM, hypothyroidism, DM2, HTN, HLD, BPH with generalized weaknesses, fever, and dysuria.  Patient reports symptoms started the past 2 days progressive getting worse.   OT comments  Pt agreeable to OT treatment with focus on UE strengthening with HOB elevated at various levels to assist in achieving near full range for AA/ROM exercises using the pt's cane. Tactile cues and modeling needed during UE AA/ROM exercises. Pt given HEP to use in the room and at home. Pt noted to struggle more with shoulder flexion and L shoulder abduction today. Pt left in the bed with call bell within reach. Pt will benefit from continued OT in the hospital and recommended venue below to increase strength, balance, and endurance for safe ADL's.         If plan is discharge home, recommend the following:  A little help with walking and/or transfers;Help with stairs or ramp for entrance   Equipment Recommendations  None recommended by OT          Precautions / Restrictions Precautions Precautions: None Restrictions Weight Bearing Restrictions Per Provider Order: No       Mobility Bed Mobility               General bed mobility comments: Pt remained reclined in bed at higher and lower angles depending on difficulty with UE strengthening.                                Communication Communication Communication: No apparent difficulties   Cognition Arousal: Alert Behavior During Therapy: WFL for tasks assessed/performed Cognition: No apparent impairments                               Following commands: Intact        Cueing   Cueing Techniques: Verbal cues, Tactile cues, Gestural cues  Exercises Exercises:  General Upper Extremity, Shoulder General Exercises - Upper Extremity Shoulder Flexion: AAROM, 10 reps, Both, Seated, Other (comment) (Pt's cane used for all AA/ROM. Pt also not fully seated but reclined with more reclined needed for flexion to achieve near full AA/ROM.) Shoulder ABduction: AAROM, Left, 10 reps, Seated Shoulder Horizontal ABduction: AAROM, Both, 10 reps, Seated (x10 protraction as well) Shoulder Exercises Shoulder External Rotation: 10 reps, AAROM, Both, Seated                 Pertinent Vitals/ Pain       Pain Assessment Pain Assessment: No/denies pain                                                          Frequency  Min 1X/week        Progress Toward Goals  OT Goals(current goals can now be found in the care plan section)  Progress towards OT goals: Progressing toward goals  Acute Rehab OT Goals Patient Stated Goal: return home OT Goal Formulation: With patient Time For Goal Achievement: 11/19/23 Potential to Achieve Goals: Good ADL Goals Pt Will  Transfer to Toilet: with modified independence Pt/caregiver will Perform Home Exercise Program: Increased strength;Increased ROM;Independently;Both right and left upper extremity  Plan                                      End of Session Equipment Utilized During Treatment: Other (comment) (SPC)  OT Visit Diagnosis: Unsteadiness on feet (R26.81);Other abnormalities of gait and mobility (R26.89);Muscle weakness (generalized) (M62.81);History of falling (Z91.81)   Activity Tolerance Patient tolerated treatment well   Patient Left in bed;with call bell/phone within reach   Nurse Communication          Time: 1447-1500 OT Time Calculation (min): 13 min  Charges: OT General Charges $OT Visit: 1 Visit OT Treatments $Therapeutic Exercise: 8-22 mins  Vandella Ord OT, MOT   Jayson Person 11/06/2023, 3:49 PM

## 2023-11-06 NOTE — Patient Instructions (Signed)

## 2023-11-06 NOTE — Progress Notes (Addendum)
 PROGRESS NOTE    Patient: Rickey Rice.                            PCP: Benjamine Aland, MD                    DOB: 1940/12/29            DOA: 11/04/2023 FMW:[id number]             DOS: 11/06/2023, 1:45 PM   LOS: 2 days   Date of Service: The patient was seen and examined on 11/06/2023  Subjective:   The patient was seen and examined this morning, stable no acute distress Complaining of persistent cough overnight Denies any chest pain mild shortness of air with exertion  Brief Narrative:   Rickey Rice. Is a 83 male with history of chronic systolic/diastolic CHF, AICD, LBBB,  NICM, hypothyroidism, DM2, HTN, HLD, BPH with generalized weaknesses, fever, and dysuria.  Patient reports symptoms started the past 2 days progressive getting worse.  ED evaluation: Blood pressure (!) 88/44 >> 96/48 Tmax 102.3 currently 99.2, pulse 74,RR  20, height 6' (1.829 m), weight 131 kg, SpO2 94%.  Labs: CBC WBC 11.1, hgb 11.6, CMP; creatinine 1.54, calcium  8.8, glucose 154, lactic acid 1.2, 0.9,  Respiratory panel-RSV, Influ A/B, COVID all negative  UA: > 500 glucose, negative for ketones, positive nitrites, many bacteria WBC 11-20  Blood and urine cultures has been obtained Patient told to be septic, initiated IV fluid, IV antibiotics of Rocephin     Assessment & Plan:   Principal Problem:   Sepsis (HCC) Active Problems:   Chronic systolic congestive heart failure/diastolic (HCC)   Benign prostatic hyperplasia   LBBB (left bundle branch block)   DM2 (diabetes mellitus, type 2) (HCC)   Hypothyroidism   Morbid obesity (HCC)   Dyslipidemia   Stiffness of right knee   History of cardiac arrest   NICM (nonischemic cardiomyopathy) (HCC)   Essential hypertension   CKD (chronic kidney disease) stage 2, GFR 60-89 ml/min   VT (ventricular tachycardia) (HCC)   Salivary stone   Biventricular ICD (implantable cardioverter-defibrillator) in place     Assessment and Plan: * Sepsis  (HCC) - Source of infection urinary tract infection - Tmax this morning 100.4, blood pressure improved and stable at 128/52 - Complaining of cough otherwise satting 95% on room air  -Obtaining a chest x-ray - Discontinue IVF- monitor fluid intake to avoid volume overload due to CHF  POA: Meeting sepsis criteria with hypotension, fever, AKI Source of infection urinary, POA: Blood pressure (!) 88/44,  Tmax 102.3 P 74,RR  20,  SpO2 94% on RA  CBC WBC 11.1, hgb  creatinine 1.54,   Lactic Acid 1.2, 0.9,  UA: > 500 glucose, negative for ketones, + nitrites, many bacteria WBC 11-20  - IV antibiotics Rocephin , IVF per sepsis protocol  -Blood cultures>> growing gram-negative rods -Urine cultures --- > 100 K colonies of SERRATIA MARCESCENS  Pending sensitivity  Persistent cough - Nonproductive - Chest x-ray-reviewed no acute findings - Schedule mucolytic's, cough suppressants  Chronic systolic congestive heart failure/diastolic (HCC) - -Per sepsis protocol s/p IV fluid resuscitation -Discontinuing IVF to avoid volume overload - ReDs Clip 26  - BNP 171.0 , 189 - Monitoring I's and O's  Intake/Output Summary (Last 24 hours) at 11/06/2023 1345 Last data filed at 11/06/2023 1300 Gross per 24 hour  Intake 819.25 ml  Output  1225 ml  Net -405.75 ml    - Resuming Lasix , initiated 20 mg twice daily for now  --Home medication reviewed: Aldactone , Lasix  Farxiga , valsartan  are on hold-till patient stabilized from sepsis - Anticipating restarting Lasix  today   Echo from March 2023 reviewed: EJF, i 40 to 45% but  may be underestimated in setting of significant septal/lateral septal  asynchrony from BBB. Definity  contrast study poor quality. The left  ventricle has mildly decreased function. The   left ventricle demonstrates global hypokinesis. Left ventricular  diastolic parameters are consistent with Grade I diastolic dysfunction  (impaired relaxation).   2. Right ventricular systolic function  is normal.    Benign prostatic hyperplasia - Monitoring urine output -Home medication reviewed, not on any meds for prostate - Voiding without retention now   Biventricular ICD (implantable cardioverter-defibrillator) in place With history of left bundle branch block, -If blood pressure allows continue beta-blockers - Monitoring on telemetry closely -Denies any chest pain or dysrhythmia  CKD (chronic kidney disease) stage 2, GFR 60-89 ml/min Acute on chronic kidney disease -  - Likely worsening BUN/creatinine due to sepsis - Monitoring BUN/creatinine closely -Will avoid nephrotoxins, hypotension Lab Results  Component Value Date   CREATININE 1.23 11/06/2023   CREATININE 1.50 (H) 11/05/2023   CREATININE 1.54 (H) 11/04/2023     Essential hypertension -Hypotensive - Currently hypotensive due to sepsis -Or holding BP medication including Aldactone , Entresto , Will resume Lasix    NICM (nonischemic cardiomyopathy) (HCC) - Monitoring closely, cardiac medicines reviewed Will be resumed as patient improves from sepsis  Dyslipidemia - Continue statins  Morbid obesity (HCC) Body mass index is 39.17 kg/m. - Discussed with patient follow-up with PCP in future regarding weight loss program  Hypothyroidism - Continue home dose Synthroid   DM2 (diabetes mellitus, type 2) (HCC) - Holding home medication of Farxiga , Januvia -Checking CBG q. ACHS, SSI coverage  A1c 6.0   LBBB (left bundle branch block) - Patient is having biventricular ICD, monitoring closely  ------------------------------------------------------------------------------------------------------------------ Nutritional status:  The patient's BMI is: Body mass index is 38.57 kg/m. I agree with the assessment and plan as outlined ----------------------------------------------------------------------------------------------------------------- Cultures; Blood Cultures x 2 >> no growth to date Urine Culture  >>> >  100 K COLONIES  of SERRATIA MARCESCENS   ------------------------------------------------------------------------------------------------------------------ DVT prophylaxis:  heparin  injection 5,000 Units Start: 11/04/23 1630 SCDs Start: 11/04/23 1628   Code Status:   Code Status: Full Code  Family Communication: Wife present at bedside updated -Advance care planning has been discussed.   Admission status:   Status is: Inpatient Remains inpatient appropriate because: Met sepsis criteria, IV fluid, IV antibiotics   Disposition: From  - home             Planning for discharge in 1 days Home with home health Pending urine sensitivity, need to be afebrile for 24 hours  Procedures:   No admission procedures for hospital encounter.   Antimicrobials:  Anti-infectives (From admission, onward)    Start     Dose/Rate Route Frequency Ordered Stop   11/04/23 1630  cefTRIAXone  (ROCEPHIN ) 2 g in sodium chloride  0.9 % 100 mL IVPB        2 g 200 mL/hr over 30 Minutes Intravenous Every 24 hours 11/04/23 1629 11/11/23 1629   11/04/23 1400  cefTRIAXone  (ROCEPHIN ) 2 g in sodium chloride  0.9 % 100 mL IVPB        2 g 200 mL/hr over 30 Minutes Intravenous  Once 11/04/23 1347 11/04/23 1618  Medication:   aspirin  EC  81 mg Oral Daily   brimonidine   1 drop Left Eye TID   carvedilol   6.25 mg Oral BID WC   Chlorhexidine  Gluconate Cloth  6 each Topical Q0600   dapagliflozin  propanediol  10 mg Oral Daily   furosemide   40 mg Intravenous Q12H   guaiFENesin -dextromethorphan   10 mL Oral Q6H   heparin   5,000 Units Subcutaneous Q8H   insulin  aspart  0-9 Units Subcutaneous TID WC   levothyroxine   75 mcg Oral q AM   magnesium  oxide  400 mg Oral Daily   rosuvastatin   20 mg Oral Daily   sodium chloride  flush  3 mL Intravenous Q12H   sodium chloride  flush  3 mL Intravenous Q12H    acetaminophen  **OR** acetaminophen , bisacodyl , hydrALAZINE , HYDROmorphone  (DILAUDID ) injection, ipratropium,  levalbuterol , ondansetron  **OR** ondansetron  (ZOFRAN ) IV, oxyCODONE , senna-docusate, sodium phosphate, traZODone    Objective:   Vitals:   11/05/23 1438 11/05/23 1853 11/05/23 2206 11/06/23 0533  BP: (!) 101/52 109/85 (!) 113/51 (!) 128/52  Pulse: 66 71 70 73  Resp: 17 18 18 18   Temp: 99 F (37.2 C)  99.5 F (37.5 C) (!) 100.4 F (38 C)  TempSrc: Oral  Oral Oral  SpO2: 97% 96% 97% 95%  Weight:      Height:        Intake/Output Summary (Last 24 hours) at 11/06/2023 1345 Last data filed at 11/06/2023 1300 Gross per 24 hour  Intake 819.25 ml  Output 1225 ml  Net -405.75 ml   Filed Weights   11/04/23 1346 11/04/23 1800 11/05/23 0611  Weight: 131 kg 129.1 kg 129 kg     Physical examination:    General:  AAO x 3,  cooperative, no distress;   HEENT:  Normocephalic, PERRL, otherwise with in Normal limits   Neuro:  CNII-XII intact. , normal motor and sensation, reflexes intact   Lungs:   Clear to auscultation BL, Respirations unlabored,  No wheezes / crackles  Cardio:    S1/S2, RRR, No murmure, No Rubs or Gallops   Abdomen:  Soft, non-tender, bowel sounds active all four quadrants, no guarding or peritoneal signs.  Muscular  skeletal:  Limited exam -global generalized weaknesses - in bed, able to move all 4 extremities,   2+ pulses,  symmetric, +1 pitting edema  Skin:  Dry, warm to touch, negative for any Rashes,  Wounds: Please see nursing documentation         ------------------------------------------------------------------------------------------------------------------------------------------    LABs:     Latest Ref Rng & Units 11/06/2023    4:12 AM 11/05/2023    4:48 AM 11/04/2023    2:15 PM  CBC  WBC 4.0 - 10.5 K/uL 5.6  10.9  11.1   Hemoglobin 13.0 - 17.0 g/dL 89.8  9.8  88.3   Hematocrit 39.0 - 52.0 % 32.4  31.0  36.1   Platelets 150 - 400 K/uL 117  126  146       Latest Ref Rng & Units 11/06/2023    4:12 AM 11/05/2023    4:48 AM 11/04/2023    2:15 PM   CMP  Glucose 70 - 99 mg/dL 96  892  845   BUN 8 - 23 mg/dL 18  20  20    Creatinine 0.61 - 1.24 mg/dL 8.76  8.49  8.45   Sodium 135 - 145 mmol/L 141  143  142   Potassium 3.5 - 5.1 mmol/L 3.8  3.6  4.0   Chloride 98 - 111  mmol/L 110  110  107   CO2 22 - 32 mmol/L 25  25  24    Calcium  8.9 - 10.3 mg/dL 8.1  8.1  8.8   Total Protein 6.5 - 8.1 g/dL 5.7  5.6  6.8   Total Bilirubin 0.0 - 1.2 mg/dL 0.7  0.8  1.0   Alkaline Phos 38 - 126 U/L 47  43  58   AST 15 - 41 U/L 15  13  16    ALT 0 - 44 U/L 11  10  13         Micro Results Recent Results (from the past 240 hours)  Blood Culture (routine x 2)     Status: None (Preliminary result)   Collection Time: 11/04/23  2:15 PM   Specimen: BLOOD LEFT HAND  Result Value Ref Range Status   Specimen Description   Final    BLOOD LEFT HAND BOTTLES DRAWN AEROBIC AND ANAEROBIC   Special Requests Blood Culture adequate volume  Final   Culture   Final    NO GROWTH 2 DAYS Performed at [facility], [address] [place], [place] [address]    Report Status PENDING  Incomplete  Resp panel by RT-PCR (RSV, Flu A&B, Covid) Anterior Nasal Swab     Status: None   Collection Time: 11/04/23  2:18 PM   Specimen: Anterior Nasal Swab  Result Value Ref Range Status   SARS Coronavirus 2 by RT PCR NEGATIVE NEGATIVE Final    Comment: (NOTE) SARS-CoV-2 target nucleic acids are NOT DETECTED.  The SARS-CoV-2 RNA is generally detectable in upper respiratory specimens during the acute phase of infection. The lowest concentration of SARS-CoV-2 viral copies this assay can detect is 138 copies/mL. A negative result does not preclude SARS-Cov-2 infection and should not be used as the sole basis for treatment or other patient management decisions. A negative result may occur with  improper specimen collection/handling, submission of specimen other than nasopharyngeal swab, presence of viral mutation(s) within the areas targeted by this assay, and inadequate number  of viral copies(<138 copies/mL). A negative result must be combined with clinical observations, patient history, and epidemiological information. The expected result is Negative.  Fact Sheet for Patients:  BloggerCourse.com  Fact Sheet for Healthcare Providers:  SeriousBroker.it  This test is no t yet approved or cleared by the [place]  FDA and  has been authorized for detection and/or diagnosis of SARS-CoV-2 by FDA under an Emergency Use Authorization (EUA). This EUA will remain  in effect (meaning this test can be used) for the duration of the COVID-19 declaration under Section 564(b)(1) of the Act, 21 U.S.C.section 360bbb-3(b)(1), unless the authorization is terminated  or revoked sooner.       Influenza A by PCR NEGATIVE NEGATIVE Final   Influenza B by PCR NEGATIVE NEGATIVE Final    Comment: (NOTE) The Xpert Xpress SARS-CoV-2/FLU/RSV plus assay is intended as an aid in the diagnosis of influenza from Nasopharyngeal swab specimens and should not be used as a sole basis for treatment. Nasal washings and aspirates are unacceptable for Xpert Xpress SARS-CoV-2/FLU/RSV testing.  Fact Sheet for Patients: BloggerCourse.com  Fact Sheet for Healthcare Providers: SeriousBroker.it  This test is not yet approved or cleared by the [place]  FDA and has been authorized for detection and/or diagnosis of SARS-CoV-2 by FDA under an Emergency Use Authorization (EUA). This EUA will remain in effect (meaning this test can be used) for the duration of the COVID-19 declaration under Section 564(b)(1) of the Act,  21 U.S.C. section 360bbb-3(b)(1), unless the authorization is terminated or revoked.     Resp Syncytial Virus by PCR NEGATIVE NEGATIVE Final    Comment: (NOTE) Fact Sheet for Patients: BloggerCourse.com  Fact Sheet for Healthcare  Providers: SeriousBroker.it  This test is not yet approved or cleared by the [place]  FDA and has been authorized for detection and/or diagnosis of SARS-CoV-2 by FDA under an Emergency Use Authorization (EUA). This EUA will remain in effect (meaning this test can be used) for the duration of the COVID-19 declaration under Section 564(b)(1) of the Act, 21 U.S.C. section 360bbb-3(b)(1), unless the authorization is terminated or revoked.  Performed at [facility], [address] [place], [place] [address]   Blood Culture (routine x 2)     Status: None (Preliminary result)   Collection Time: 11/04/23  2:32 PM   Specimen: BLOOD RIGHT HAND  Result Value Ref Range Status   Specimen Description   Final    BLOOD RIGHT HAND BOTTLES DRAWN AEROBIC AND ANAEROBIC   Special Requests Blood Culture adequate volume  Final   Culture   Final    NO GROWTH 2 DAYS Performed at [facility], [address], [place], [place] [address]    Report Status PENDING  Incomplete  Urine Culture     Status: Abnormal (Preliminary result)   Collection Time: 11/04/23  2:53 PM   Specimen: Urine, Random  Result Value Ref Range Status   Specimen Description   Final    URINE, RANDOM Performed at [facility], [address], [place], [place] [address]    Special Requests   Final    NONE Reflexed from [phone] Performed at [facility], [address] [place], [place] [address]    Culture (A)  Final    >=100,000 COLONIES/mL SERRATIA MARCESCENS SUSCEPTIBILITIES TO FOLLOW Performed at [facility], [address], [place], [place] [address]    Report Status PENDING  Incomplete  MRSA Next Gen by PCR, Nasal     Status: None   Collection Time: 11/04/23  4:42 PM   Specimen: Nasal Mucosa; Nasal Swab  Result Value Ref Range Status   MRSA by PCR Next Gen NOT DETECTED NOT DETECTED Final    Comment: (NOTE) The GeneXpert MRSA Assay (FDA approved for NASAL specimens only), is one component  of a comprehensive MRSA colonization surveillance program. It is not intended to diagnose MRSA infection nor to guide or monitor treatment for MRSA infections. Test performance is not FDA approved in patients less than 29 years old. Performed at [facility], [address] [place], [place] [address]   Culture, blood (x 2)     Status: None (Preliminary result)   Collection Time: 11/04/23  5:26 PM   Specimen: BLOOD  Result Value Ref Range Status   Specimen Description BLOOD RIGHT ANTECUBITAL  Final   Special Requests   Final    BOTTLES DRAWN AEROBIC AND ANAEROBIC Blood Culture adequate volume   Culture   Final    NO GROWTH 2 DAYS Performed at [facility], [address], [place], [place] [address]    Report Status PENDING  Incomplete  Culture, blood (x 2)     Status: None (Preliminary result)   Collection Time: 11/04/23  5:26 PM   Specimen: BLOOD  Result Value Ref Range Status   Specimen Description BLOOD BLOOD RIGHT HAND  Final   Special Requests   Final    BOTTLES DRAWN AEROBIC AND ANAEROBIC Blood Culture adequate volume  Culture   Final    NO GROWTH 2 DAYS Performed at [facility], [address] [place], [place] [address]    Report Status PENDING  Incomplete    Radiology Reports DG CHEST PORT 1 VIEW Result Date: 11/06/2023 CLINICAL DATA:  Shortness of breath. EXAM: PORTABLE CHEST 1 VIEW COMPARISON:  11/04/2023 FINDINGS: Left-sided pacemaker remains in place. Cardiomegaly stable. Chronic elevation of right hemidiaphragm with mild adjacent atelectasis. No acute airspace disease, pulmonary edema, pleural effusion or pneumothorax. Stable osseous structures. IMPRESSION: 1. No acute chest findings. 2. Chronic elevation of right hemidiaphragm with adjacent atelectasis. Electronically Signed   By: Andrea Gasman M.D.   On: 11/06/2023 11:55    SIGNED: Adriana DELENA Grams, MD, FHM. FAAFP. Jolynn Pack - [facility] Time spent - 55 min.  In seeing, evaluating and examining the  patient. Reviewing medical records, labs, drawn plan of care. [facility],  Pager (please use [email or web address] to page/ text) Please use Epic Secure Chat for non-urgent communication (7AM-7PM)  If 7PM-7AM, please contact night-coverage [email or web address], 11/06/2023, 1:45 PM

## 2023-11-06 NOTE — Progress Notes (Signed)
 Physical Therapy Treatment Patient Details Name: Rickey Rice. MRN: [id number] DOB: 1941-03-14 Today's Date: 11/06/2023   History of Present Illness Rickey Rice. Is a 44 male with history of chronic systolic/diastolic CHF, AICD, LBBB,  NICM, hypothyroidism, DM2, HTN, HLD, BPH with generalized weaknesses, fever, and dysuria.  Patient reports symptoms started the past 2 days progressive getting worse.    PT Comments  Patient agreeable to PT session. Was received supine in bed with HOB elevated. Patient required min A to get EOB. Patient agreed to LE exercises at EOB. Demonstrated good seated balance and core control throughout. Print out of HEP given to pt as end of session. Pt independent with scooting to [facility], with inc time. Min A for RLE management during sit to supine due to inc pain in R foot this date which limits session. Nursing staff notified. Patient left in bed, call button within reach, and all needs met. Patient will benefit from continued skilled physical therapy acutely and in recommended venue in order to address any remaining deficits.     If plan is discharge home, recommend the following: A little help with walking and/or transfers;A little help with bathing/dressing/bathroom;Help with stairs or ramp for entrance;Assistance with cooking/housework   Can travel by private vehicle        Equipment Recommendations  None recommended by PT    Recommendations for Other Services       Precautions / Restrictions Precautions Precautions: None Restrictions Weight Bearing Restrictions Per Provider Order: No     Mobility  Bed Mobility Overal bed mobility: Needs Assistance Bed Mobility: Supine to Sit, Sit to Supine     Supine to sit: Min assist Sit to supine: Min assist   General bed mobility comments: Min A needed via pull to sit for supine>sit. HOB elevated, inc time    Transfers     General transfer comment: pt declined OOB activity this date due to inc  R foot pain. Able to scoot bottom to [facility] independently    Ambulation/Gait     General Gait Details: pt declined OOB activity this date due to inc R foot pain.   Stairs             Wheelchair Mobility     Tilt Bed    Modified Rankin (Stroke Patients Only)       Balance Overall balance assessment: Needs assistance Sitting-balance support: Feet supported, No upper extremity supported Sitting balance-Leahy Scale: Good Sitting balance - Comments: seated at EOB during LE exercises       Standing balance comment: pt declined OOB activity this date due to inc R foot pain.        Communication Communication Communication: No apparent difficulties  Cognition Arousal: Alert Behavior During Therapy: WFL for tasks assessed/performed   PT - Cognitive impairments: No apparent impairments     Following commands: Intact      Cueing Cueing Techniques: Verbal cues, Tactile cues, Visual cues  Exercises General Exercises - Lower Extremity Long Arc Quad: AROM, Strengthening, Both, 10 reps, Seated Hip ABduction/ADduction: AROM, Strengthening, Both, 10 reps, Seated Hip Flexion/Marching: AROM, Strengthening, Both, 10 reps, Seated Toe Raises: AROM, Strengthening, Both, 10 reps, Seated Heel Raises: AROM, Strengthening, Both, 10 reps, Seated    General Comments        Pertinent Vitals/Pain Pain Assessment Pain Assessment: 0-10 Pain Score: 3  Pain Location: R foot Pain Descriptors / Indicators: Tightness, Numbness Pain Intervention(s): Limited activity within patient's tolerance    Home  Living                          Prior Function            PT Goals (current goals can now be found in the care plan section) Acute Rehab PT Goals Patient Stated Goal: return home with family to assist PT Goal Formulation: With patient/family Time For Goal Achievement: 11/08/23 Potential to Achieve Goals: Good Progress towards PT goals: Progressing toward goals     Frequency    Min 2X/week      PT Plan      Co-evaluation              AM-PAC PT 6 Clicks Mobility   Outcome Measure  Help needed turning from your back to your side while in a flat bed without using bedrails?: None Help needed moving from lying on your back to sitting on the side of a flat bed without using bedrails?: A Little Help needed moving to and from a bed to a chair (including a wheelchair)?: None Help needed standing up from a chair using your arms (e.g., wheelchair or bedside chair)?: None Help needed to walk in hospital room?: A Little Help needed climbing 3-5 steps with a railing? : A Little 6 Click Score: 21    End of Session   Activity Tolerance: Patient tolerated treatment well;Patient limited by pain Patient left: in bed;with call bell/phone within reach Nurse Communication: Patient requests pain meds;Mobility status PT Visit Diagnosis: Unsteadiness on feet (R26.81);Other abnormalities of gait and mobility (R26.89);Muscle weakness (generalized) (M62.81)     Time: 8498-8481 PT Time Calculation (min) (ACUTE ONLY): 17 min  Charges:    $Therapeutic Exercise: 8-22 mins PT General Charges $$ ACUTE PT VISIT: 1 Visit                     4:05 PM, 11/06/23 Esteven Overfelt Powell-Butler, PT, DPT  with [facility]

## 2023-11-07 DIAGNOSIS — A419 Sepsis, unspecified organism: Secondary | ICD-10-CM | POA: Diagnosis not present

## 2023-11-07 DIAGNOSIS — I428 Other cardiomyopathies: Secondary | ICD-10-CM

## 2023-11-07 DIAGNOSIS — E119 Type 2 diabetes mellitus without complications: Secondary | ICD-10-CM

## 2023-11-07 DIAGNOSIS — Z8674 Personal history of sudden cardiac arrest: Secondary | ICD-10-CM

## 2023-11-07 DIAGNOSIS — Z95810 Presence of automatic (implantable) cardiac defibrillator: Secondary | ICD-10-CM

## 2023-11-07 DIAGNOSIS — E039 Hypothyroidism, unspecified: Secondary | ICD-10-CM

## 2023-11-07 LAB — COMPREHENSIVE METABOLIC PANEL WITH GFR
ALT: 21 U/L (ref 0–44)
AST: 38 U/L (ref 15–41)
Albumin: 2.9 g/dL — ABNORMAL LOW (ref 3.5–5.0)
Alkaline Phosphatase: 52 U/L (ref 38–126)
Anion gap: 9 (ref 5–15)
BUN: 20 mg/dL (ref 8–23)
CO2: 27 mmol/L (ref 22–32)
Calcium: 8.5 mg/dL — ABNORMAL LOW (ref 8.9–10.3)
Chloride: 107 mmol/L (ref 98–111)
Creatinine, Ser: 1.33 mg/dL — ABNORMAL HIGH (ref 0.61–1.24)
GFR, Estimated: 53 mL/min — ABNORMAL LOW (ref >60)
Glucose, Bld: 109 mg/dL — ABNORMAL HIGH (ref 70–99)
Potassium: 4.1 mmol/L (ref 3.5–5.1)
Sodium: 143 mmol/L (ref 135–145)
Total Bilirubin: 0.6 mg/dL (ref 0.0–1.2)
Total Protein: 6.5 g/dL (ref 6.5–8.1)

## 2023-11-07 LAB — URINE CULTURE: Culture: >=100000 — AB

## 2023-11-07 LAB — GLUCOSE, CAPILLARY
Glucose-Capillary: 134 mg/dL — ABNORMAL HIGH (ref 70–99)
Glucose-Capillary: 97 mg/dL (ref 70–99)

## 2023-11-07 LAB — BRAIN NATRIURETIC PEPTIDE: B Natriuretic Peptide: 277 pg/mL — ABNORMAL HIGH (ref 0.0–100.0)

## 2023-11-07 MED ORDER — SODIUM CHLORIDE 0.9 % IV SOLN
2.0000 g | Freq: Two times a day (BID) | INTRAVENOUS | Status: DC
  Administered 2023-11-07 (×2): 2 g via INTRAVENOUS
  Filled 2023-11-07: qty 12.5

## 2023-11-07 MED ORDER — LEVOFLOXACIN 500 MG PO TABS: 500.0000 mg | ORAL_TABLET | Freq: Every day | ORAL | 0 refills | Status: AC

## 2023-11-07 MED ORDER — FLUCONAZOLE 150 MG PO TABS
150.0000 mg | ORAL_TABLET | Freq: Once | ORAL | Status: AC
  Administered 2023-11-07 (×2): 150 mg via ORAL
  Filled 2023-11-07: qty 1

## 2023-11-07 NOTE — Discharge Summary (Signed)
 Physician Discharge Summary  Rickey Rice. FMW:[id number] DOB: 05-03-1940 DOA: 11/04/2023  PCP: Benjamine Aland, MD  Admit date: 11/04/2023 Discharge date: 11/07/2023  Admitted From:  Home Disposition: Home with HH   Recommendations for Outpatient Follow-up:  Follow up with PCP in 1 weeks Follow up with cardiology as scheduled  Home Health:  PT  Discharge Condition: STABLE   CODE STATUS: FULL DIET: regular   Brief Hospitalization Summary: Please see all hospital notes, images, labs for full details of the hospitalization. Admission provider HPI:  83 male with history of chronic systolic/diastolic CHF, AICD, LBBB,  NICM, hypothyroidism, DM2, HTN, HLD, BPH with generalized weaknesses, fever, and dysuria.  Patient reports symptoms started the past 2 days progressive getting worse.   ED evaluation: Blood pressure (!) 88/44 >> 96/48 Tmax 102.3 currently 99.2, pulse 74,RR  20, height 6' (1.829 m), weight 131 kg, SpO2 94%.   Labs: CBC WBC 11.1, hgb 11.6, CMP; creatinine 1.54, calcium  8.8, glucose 154, lactic acid 1.2, 0.9,  Respiratory panel-RSV, Influ A/B, COVID all negative  UA: > 500 glucose, negative for ketones, positive nitrites, many bacteria WBC 11-20   Blood and urine cultures has been obtained.  Patient was septic, initiated IV fluid, IV antibiotics of Rocephin .   Hospital Course by listed problems addressed    Sepsis - RESOLVED  - Source of infection Serratia marcescens urinary tract infection - Complaining of cough otherwise satting 95% on room air - Discontinue IVF UA: > 500 glucose, negative for ketones, + nitrites, many bacteria WBC 11-20   - IV antibiotics Rocephin , IV cefepime     -Blood cultures>> growing gram-negative rods -Urine cultures --- > 100 K colonies of SERRATIA MARCESCENS   Serratia marcescens bacteremia -- treated with IV ceftriaxone  and cefepime  -- will DC on oral levofloxacin  to complete 6 more days of therapy for full 10 days   Persistent  cough -- Resolved - Nonproductive - Chest x-ray-reviewed no acute findings - mucolytics, cough suppressants   Chronic systolic congestive heart failure/diastolic  - Per sepsis protocol s/p IV fluid resuscitation - Discontinuing IVF to avoid volume overload - ReDs Clip 26  - BNP 171.0 , 189 - Monitoring I's and O's    - Resumed Lasix , initiated 20 mg twice daily for now   - Home medication reviewed: Aldactone , Lasix  Farxiga , valsartan    Echo from March 2023 reviewed: EJF, i 40 to 45% but  may be underestimated in setting of significant septal/lateral septal  asynchrony from BBB. Definity  contrast study poor quality. The left  ventricle has mildly decreased function. The   left ventricle demonstrates global hypokinesis. Left ventricular  diastolic parameters are consistent with Grade I diastolic dysfunction  (impaired relaxation).   2. Right ventricular systolic function is normal.    Benign prostatic hyperplasia - Monitoring urine output - Home medication reviewed, not on any meds for prostate - Voiding without retention now    Biventricular ICD (implantable cardioverter-defibrillator) in place With history of left bundle branch block, -If blood pressure allows continue beta-blockers - Monitoring on telemetry closely - Denies any chest pain or dysrhythmia   CKD (chronic kidney disease) stage 2, GFR 60-89 ml/min Acute on chronic kidney disease -  - Likely worsening BUN/creatinine due to sepsis - Monitoring BUN/creatinine closely -Will avoid nephrotoxins, hypotension Recent Labs       Lab Results  Component Value Date    CREATININE 1.23 11/06/2023    CREATININE 1.50 (H) 11/05/2023    CREATININE 1.54 (H) 11/04/2023  Essential hypertension -Hypotensive - Currently hypotensive due to sepsis -Or holding BP medication including Aldactone , Entresto , Will resume Lasix     NICM (nonischemic cardiomyopathy) - Monitoring closely, cardiac medicines reviewed - Will be resumed  as patient improves from sepsis   Dyslipidemia - Continue statins   Morbid obesity (HCC) Body mass index is 39.17 kg/m. - Discussed with patient follow-up with PCP in future regarding weight loss program   Hypothyroidism - Continue home dose Synthroid    DM2 (diabetes mellitus, type 2)  - Holding home medication of Farxiga , Januvia -Checking CBG q. ACHS, SSI coverage  A1c 6.0    LBBB (left bundle branch block) - Patient is having biventricular ICD, monitoring closely   Discharge Diagnoses:  Principal Problem:   Sepsis (HCC) Active Problems:   LBBB (left bundle branch block)   DM2 (diabetes mellitus, type 2) (HCC)   Hypothyroidism   Morbid obesity (HCC)   Dyslipidemia   Stiffness of right knee   History of cardiac arrest   NICM (nonischemic cardiomyopathy) (HCC)   Essential hypertension   CKD (chronic kidney disease) stage 2, GFR 60-89 ml/min   Benign prostatic hyperplasia   Chronic systolic congestive heart failure/diastolic (HCC)   VT (ventricular tachycardia) (HCC)   Salivary stone   Biventricular ICD (implantable cardioverter-defibrillator) in place   Discharge Instructions:  Allergies as of 11/07/2023       Reactions   Bee Venom Swelling   Swelling on lips and tongue   Demerol  [meperidine ] Other (See Comments)   Unknown    Sulfa Antibiotics Other (See Comments)   Unknown   Pioglitazone Swelling   Actos  Lip swelling   Shellfish-derived Products Rash   Shrimp Extract Rash   Only fresh shrimp        Medication List     TAKE these medications    aspirin  EC 81 MG tablet Take 81 mg by mouth daily.   brimonidine  0.2 % ophthalmic solution Commonly known as: ALPHAGAN  Place 1 drop into the left eye 3 (three) times daily.   carboxymethylcellulose 0.5 % Soln Commonly known as: REFRESH PLUS Place 1 drop into the right eye in the morning and at bedtime.   carvedilol  12.5 MG tablet Commonly known as: COREG  Take 0.5 tablets (6.25 mg total) by mouth  2 (two) times daily with a meal.   CINNAMON PO Take 1,000 mg by mouth daily.   dapagliflozin  propanediol 10 MG Tabs tablet Commonly known as: FARXIGA  Take 10 mg by mouth daily.   Entresto  97-103 MG Generic drug: sacubitril -valsartan  Take 1/2 tablet by mouth twice daily What changed:  how much to take how to take this when to take this   glucose blood test strip USE AS DIRECTED TO CHECK BLOOD SUGAR BID   ICaps Areds 2 Caps Take 1 capsule by mouth 2 (two) times daily.   Januvia 100 MG tablet Generic drug: sitaGLIPtin Take 100 mg by mouth daily.   levofloxacin  500 MG tablet Commonly known as: LEVAQUIN  Take 1 tablet (500 mg total) by mouth daily for 6 days.   levothyroxine  75 MCG tablet Commonly known as: SYNTHROID  Take 75 mcg by mouth See admin instructions. Take 75 mcg daily on Monday through Saturday, skip Sundays   Magnesium  250 MG Caps Take 250 mg by mouth daily.   OMEGA 3 PO Take 1 capsule by mouth 2 (two) times daily.   rosuvastatin  20 MG tablet Commonly known as: CRESTOR  Take 20 mg by mouth daily.   spironolactone  25 MG tablet Commonly known  as: ALDACTONE  TAKE 1/2 TABLET(12.5 MG) BY MOUTH DAILY What changed:  how much to take how to take this when to take this   TURMERIC PO Take 1 tablet by mouth 2 (two) times daily.        Follow-up Information     Benjamine Aland, MD. Schedule an appointment as soon as possible for a visit in 1 week(s).   Specialty: Family Medicine Why: Hospital Follow Up Contact information: [address] [place] [address] [phone]                Allergies  Allergen Reactions   Bee Venom Swelling    Swelling on lips and tongue   Demerol  [Meperidine ] Other (See Comments)    Unknown    Sulfa Antibiotics Other (See Comments)    Unknown   Pioglitazone Swelling    Actos  Lip swelling   Shellfish-Derived Products Rash   Shrimp Extract Rash    Only fresh shrimp   Allergies as of 11/07/2023        Reactions   Bee Venom Swelling   Swelling on lips and tongue   Demerol  [meperidine ] Other (See Comments)   Unknown    Sulfa Antibiotics Other (See Comments)   Unknown   Pioglitazone Swelling   Actos  Lip swelling   Shellfish-derived Products Rash   Shrimp Extract Rash   Only fresh shrimp        Medication List     TAKE these medications    aspirin  EC 81 MG tablet Take 81 mg by mouth daily.   brimonidine  0.2 % ophthalmic solution Commonly known as: ALPHAGAN  Place 1 drop into the left eye 3 (three) times daily.   carboxymethylcellulose 0.5 % Soln Commonly known as: REFRESH PLUS Place 1 drop into the right eye in the morning and at bedtime.   carvedilol  12.5 MG tablet Commonly known as: COREG  Take 0.5 tablets (6.25 mg total) by mouth 2 (two) times daily with a meal.   CINNAMON PO Take 1,000 mg by mouth daily.   dapagliflozin  propanediol 10 MG Tabs tablet Commonly known as: FARXIGA  Take 10 mg by mouth daily.   Entresto  97-103 MG Generic drug: sacubitril -valsartan  Take 1/2 tablet by mouth twice daily What changed:  how much to take how to take this when to take this   glucose blood test strip USE AS DIRECTED TO CHECK BLOOD SUGAR BID   ICaps Areds 2 Caps Take 1 capsule by mouth 2 (two) times daily.   Januvia 100 MG tablet Generic drug: sitaGLIPtin Take 100 mg by mouth daily.   levofloxacin  500 MG tablet Commonly known as: LEVAQUIN  Take 1 tablet (500 mg total) by mouth daily for 6 days.   levothyroxine  75 MCG tablet Commonly known as: SYNTHROID  Take 75 mcg by mouth See admin instructions. Take 75 mcg daily on Monday through Saturday, skip Sundays   Magnesium  250 MG Caps Take 250 mg by mouth daily.   OMEGA 3 PO Take 1 capsule by mouth 2 (two) times daily.   rosuvastatin  20 MG tablet Commonly known as: CRESTOR  Take 20 mg by mouth daily.   spironolactone  25 MG tablet Commonly known as: ALDACTONE  TAKE 1/2 TABLET(12.5 MG) BY MOUTH DAILY What  changed:  how much to take how to take this when to take this   TURMERIC PO Take 1 tablet by mouth 2 (two) times daily.        Procedures/Studies: DG CHEST PORT 1 VIEW Result Date: 11/06/2023 CLINICAL DATA:  Shortness of breath. EXAM: PORTABLE CHEST 1 VIEW COMPARISON:  11/04/2023 FINDINGS: Left-sided pacemaker remains in place. Cardiomegaly stable. Chronic elevation of right hemidiaphragm with mild adjacent atelectasis. No acute airspace disease, pulmonary edema, pleural effusion or pneumothorax. Stable osseous structures. IMPRESSION: 1. No acute chest findings. 2. Chronic elevation of right hemidiaphragm with adjacent atelectasis. Electronically Signed   By: Andrea Gasman M.D.   On: 11/06/2023 11:55   DG Chest Port 1 View Result Date: 11/04/2023 CLINICAL DATA:  Code sepsis.  Hypotension. EXAM: PORTABLE CHEST 1 VIEW COMPARISON:  01/31/2023 and CT chest 06/07/2014. FINDINGS: Trachea is midline. Heart is enlarged, stable. Biventricular pacemaker and ICD lead tips are stable in position. Lungs are low in volume. No airspace consolidation or pleural fluid. IMPRESSION: No acute findings. Electronically Signed   By: Newell Eke M.D.   On: 11/04/2023 14:33   CUP PACEART INCLINIC DEVICE CHECK Result Date: 10/16/2023 Normal in-clinic CRT-D (multi-lead) check. Presenting Rhythm: ASBiV . Routine testing was performed. Thresholds, sensing, impedance trend were stable and no changes were required. HF diagnostics are stable. No treated arrhythmias. Patient BiV pacing 99% of the time. Estimated longevity 6.6-7.7 years. Pt enrolled in remote follow-up. Noted LV threshold testing M2-RV Coil with possible anodal stimulation (RV/LV line up during testing) but noticed same phenomenon in bipolar configuration > M2-RV Coil was best threshold.  Others were >4 V. Appears this has been his same configuration from many years. CS lead appears anterior on CXR in lateral view. bo    Subjective: Pt reports he is  feeling much better and eager to discharge today.    Discharge Exam: Vitals:   11/07/23 0448 11/07/23 0750  BP: 108/68 118/78  Pulse: 63 64  Resp: 20   Temp: 98.3 F (36.8 C)   SpO2: 97%    Vitals:   11/06/23 2108 11/06/23 2211 11/07/23 0448 11/07/23 0750  BP: (!) 105/49 (!) 112/58 108/68 118/78  Pulse: 72 72 63 64  Resp: 20  20   Temp: (!) 100.6 F (38.1 C) 100 F (37.8 C) 98.3 F (36.8 C)   TempSrc: Oral Oral Oral   SpO2: 95%  97%   Weight:   130.4 kg   Height:        General: Pt is alert, awake, not in acute distress Cardiovascular: normal S1/S2 +, no rubs, no gallops Respiratory: CTA bilaterally, no wheezing, no rhonchi Abdominal: Soft, NT, ND, bowel sounds + Extremities: trace pretibial edema, no cyanosis   The results of significant diagnostics from this hospitalization (including imaging, microbiology, ancillary and laboratory) are listed below for reference.     Microbiology: Recent Results (from the past 240 hours)  Blood Culture (routine x 2)     Status: None (Preliminary result)   Collection Time: 11/04/23  2:15 PM   Specimen: BLOOD LEFT HAND  Result Value Ref Range Status   Specimen Description   Final    BLOOD LEFT HAND BOTTLES DRAWN AEROBIC AND ANAEROBIC   Special Requests Blood Culture adequate volume  Final   Culture   Final    NO GROWTH 3 DAYS Performed at [facility], [address] [place], [place] [address]    Report Status PENDING  Incomplete  Resp panel by RT-PCR (RSV, Flu A&B, Covid) Anterior Nasal Swab     Status: None   Collection Time: 11/04/23  2:18 PM   Specimen: Anterior Nasal Swab  Result Value Ref Range Status   SARS Coronavirus 2 by RT PCR NEGATIVE NEGATIVE Final  Comment: (NOTE) SARS-CoV-2 target nucleic acids are NOT DETECTED.  The SARS-CoV-2 RNA is generally detectable in upper respiratory specimens during the acute phase of infection. The lowest concentration of SARS-CoV-2 viral copies this assay can detect is 138  copies/mL. A negative result does not preclude SARS-Cov-2 infection and should not be used as the sole basis for treatment or other patient management decisions. A negative result may occur with  improper specimen collection/handling, submission of specimen other than nasopharyngeal swab, presence of viral mutation(s) within the areas targeted by this assay, and inadequate number of viral copies(<138 copies/mL). A negative result must be combined with clinical observations, patient history, and epidemiological information. The expected result is Negative.  Fact Sheet for Patients:  BloggerCourse.com  Fact Sheet for Healthcare Providers:  SeriousBroker.it  This test is no t yet approved or cleared by the [place]  FDA and  has been authorized for detection and/or diagnosis of SARS-CoV-2 by FDA under an Emergency Use Authorization (EUA). This EUA will remain  in effect (meaning this test can be used) for the duration of the COVID-19 declaration under Section 564(b)(1) of the Act, 21 U.S.C.section 360bbb-3(b)(1), unless the authorization is terminated  or revoked sooner.       Influenza A by PCR NEGATIVE NEGATIVE Final   Influenza B by PCR NEGATIVE NEGATIVE Final    Comment: (NOTE) The Xpert Xpress SARS-CoV-2/FLU/RSV plus assay is intended as an aid in the diagnosis of influenza from Nasopharyngeal swab specimens and should not be used as a sole basis for treatment. Nasal washings and aspirates are unacceptable for Xpert Xpress SARS-CoV-2/FLU/RSV testing.  Fact Sheet for Patients: BloggerCourse.com  Fact Sheet for Healthcare Providers: SeriousBroker.it  This test is not yet approved or cleared by the [place]  FDA and has been authorized for detection and/or diagnosis of SARS-CoV-2 by FDA under an Emergency Use Authorization (EUA). This EUA will remain in effect (meaning  this test can be used) for the duration of the COVID-19 declaration under Section 564(b)(1) of the Act, 21 U.S.C. section 360bbb-3(b)(1), unless the authorization is terminated or revoked.     Resp Syncytial Virus by PCR NEGATIVE NEGATIVE Final    Comment: (NOTE) Fact Sheet for Patients: BloggerCourse.com  Fact Sheet for Healthcare Providers: SeriousBroker.it  This test is not yet approved or cleared by the [place]  FDA and has been authorized for detection and/or diagnosis of SARS-CoV-2 by FDA under an Emergency Use Authorization (EUA). This EUA will remain in effect (meaning this test can be used) for the duration of the COVID-19 declaration under Section 564(b)(1) of the Act, 21 U.S.C. section 360bbb-3(b)(1), unless the authorization is terminated or revoked.  Performed at [facility], [address], [place], [place] [address]   Blood Culture (routine x 2)     Status: Abnormal (Preliminary result)   Collection Time: 11/04/23  2:32 PM   Specimen: BLOOD RIGHT HAND  Result Value Ref Range Status   Specimen Description   Final    BLOOD RIGHT HAND BOTTLES DRAWN AEROBIC AND ANAEROBIC Performed at [facility], [address] [place], [place] [address]    Special Requests   Final    Blood Culture adequate volume Performed at [facility], [address], [place], [place] [address]    Culture  Setup Time   Final    AEROBIC BOTTLE ONLY GRAM NEGATIVE RODS Gram Stain Report Called to,Read Back By and Verified With: K THOMAS AT 1441 ON 08.12.25 BY ADGER J RN K. BAIL [id number] @2032  FH  Culture (A)  Final    SERRATIA MARCESCENS SUSCEPTIBILITIES TO FOLLOW Performed at [facility], [address] [place], [place] [address]    Report Status PENDING  Incomplete  Blood Culture ID Panel (Reflexed)     Status: Abnormal   Collection Time: 11/04/23  2:32 PM  Result Value Ref Range Status   Enterococcus faecalis NOT DETECTED  NOT DETECTED Final   Enterococcus Faecium NOT DETECTED NOT DETECTED Final   Listeria monocytogenes NOT DETECTED NOT DETECTED Final   Staphylococcus species NOT DETECTED NOT DETECTED Final   Staphylococcus aureus (BCID) NOT DETECTED NOT DETECTED Final   Staphylococcus epidermidis NOT DETECTED NOT DETECTED Final   Staphylococcus lugdunensis NOT DETECTED NOT DETECTED Final   Streptococcus species NOT DETECTED NOT DETECTED Final   Streptococcus agalactiae NOT DETECTED NOT DETECTED Final   Streptococcus pneumoniae NOT DETECTED NOT DETECTED Final   Streptococcus pyogenes NOT DETECTED NOT DETECTED Final   A.calcoaceticus-baumannii NOT DETECTED NOT DETECTED Final   Bacteroides fragilis NOT DETECTED NOT DETECTED Final   Enterobacterales DETECTED (A) NOT DETECTED Final    Comment: Enterobacterales represent a large order of gram negative bacteria, not a single organism. CRITICAL RESULT CALLED TO, READ BACK BY AND VERIFIED WITH: RN K. BAIL [id number] @2032  FH    Enterobacter cloacae complex NOT DETECTED NOT DETECTED Final   Escherichia coli NOT DETECTED NOT DETECTED Final   Klebsiella aerogenes NOT DETECTED NOT DETECTED Final   Klebsiella oxytoca NOT DETECTED NOT DETECTED Final   Klebsiella pneumoniae NOT DETECTED NOT DETECTED Final   Proteus species NOT DETECTED NOT DETECTED Final   Salmonella species NOT DETECTED NOT DETECTED Final   Serratia marcescens DETECTED (A) NOT DETECTED Final    Comment: CRITICAL RESULT CALLED TO, READ BACK BY AND VERIFIED WITH: RN K. BAIL [id number] @2032  FH    Haemophilus influenzae NOT DETECTED NOT DETECTED Final   Neisseria meningitidis NOT DETECTED NOT DETECTED Final   Pseudomonas aeruginosa NOT DETECTED NOT DETECTED Final   Stenotrophomonas maltophilia NOT DETECTED NOT DETECTED Final   Candida albicans NOT DETECTED NOT DETECTED Final   Candida auris NOT DETECTED NOT DETECTED Final   Candida glabrata NOT DETECTED NOT DETECTED Final   Candida krusei NOT DETECTED NOT  DETECTED Final   Candida parapsilosis NOT DETECTED NOT DETECTED Final   Candida tropicalis NOT DETECTED NOT DETECTED Final   Cryptococcus neoformans/gattii NOT DETECTED NOT DETECTED Final   CTX-M ESBL NOT DETECTED NOT DETECTED Final   Carbapenem resistance IMP NOT DETECTED NOT DETECTED Final   Carbapenem resistance KPC NOT DETECTED NOT DETECTED Final   Carbapenem resistance NDM NOT DETECTED NOT DETECTED Final   Carbapenem resist OXA 48 LIKE NOT DETECTED NOT DETECTED Final   Carbapenem resistance VIM NOT DETECTED NOT DETECTED Final    Comment: Performed at [facility], [address] [place], [place] [address]  Urine Culture     Status: Abnormal   Collection Time: 11/04/23  2:53 PM   Specimen: Urine, Random  Result Value Ref Range Status   Specimen Description   Final    URINE, RANDOM Performed at [facility], [address] [place], [place] [address]    Special Requests   Final    NONE Reflexed from [phone] Performed at [facility], [address], [place], [place] [address]    Culture >=100,000 COLONIES/mL SERRATIA MARCESCENS (A)  Final   Report Status 11/07/2023 FINAL  Final   Organism ID, Bacteria SERRATIA MARCESCENS (A)  Final  Susceptibility   Serratia marcescens - MIC*    CEFEPIME  <=0.12 SENSITIVE Sensitive     ERTAPENEM <=0.12 SENSITIVE Sensitive     CEFTRIAXONE  <=0.25 SENSITIVE Sensitive     CIPROFLOXACIN <=0.06 SENSITIVE Sensitive     GENTAMICIN  <=1 SENSITIVE Sensitive     NITROFURANTOIN 128 RESISTANT Resistant     TRIMETH/SULFA <=20 SENSITIVE Sensitive     MEROPENEM <=0.25 SENSITIVE Sensitive     * >=100,000 COLONIES/mL SERRATIA MARCESCENS  MRSA Next Gen by PCR, Nasal     Status: None   Collection Time: 11/04/23  4:42 PM   Specimen: Nasal Mucosa; Nasal Swab  Result Value Ref Range Status   MRSA by PCR Next Gen NOT DETECTED NOT DETECTED Final    Comment: (NOTE) The GeneXpert MRSA Assay (FDA approved for NASAL specimens only), is one component of a  comprehensive MRSA colonization surveillance program. It is not intended to diagnose MRSA infection nor to guide or monitor treatment for MRSA infections. Test performance is not FDA approved in patients less than 26 years old. Performed at [facility], [address], [place], [place] [address]   Culture, blood (x 2)     Status: None (Preliminary result)   Collection Time: 11/04/23  5:26 PM   Specimen: BLOOD  Result Value Ref Range Status   Specimen Description BLOOD RIGHT ANTECUBITAL  Final   Special Requests   Final    BOTTLES DRAWN AEROBIC AND ANAEROBIC Blood Culture adequate volume   Culture   Final    NO GROWTH 3 DAYS Performed at [facility], [address], [place], [place] [address]    Report Status PENDING  Incomplete  Culture, blood (x 2)     Status: None (Preliminary result)   Collection Time: 11/04/23  5:26 PM   Specimen: BLOOD  Result Value Ref Range Status   Specimen Description BLOOD BLOOD RIGHT HAND  Final   Special Requests   Final    BOTTLES DRAWN AEROBIC AND ANAEROBIC Blood Culture adequate volume   Culture   Final    NO GROWTH 3 DAYS Performed at [facility], [address] [place], [place] [address]    Report Status PENDING  Incomplete     Labs: BNP (last 3 results) Recent Labs    11/05/23 0448 11/06/23 0412 11/07/23 0454  BNP 171.0* 189.0* 277.0*   Basic Metabolic Panel: Recent Labs  Lab 11/04/23 1415 11/05/23 0448 11/06/23 0412 11/07/23 0454  NA 142 143 141 143  K 4.0 3.6 3.8 4.1  CL 107 110 110 107  CO2 24 25 25 27   GLUCOSE 154* 107* 96 109*  BUN 20 20 18 20   CREATININE 1.54* 1.50* 1.23 1.33*  CALCIUM  8.8* 8.1* 8.1* 8.5*  MG 2.2  --   --   --   PHOS 2.3*  --   --   --    Liver Function Tests: Recent Labs  Lab 11/04/23 1415 11/05/23 0448 11/06/23 0412 11/07/23 0454  AST 16 13* 15 38  ALT 13 10 11 21   ALKPHOS 58 43 47 52  BILITOT 1.0 0.8 0.7 0.6  PROT 6.8 5.6* 5.7* 6.5  ALBUMIN  3.4* 2.8* 2.7* 2.9*   No results for  input(s): LIPASE, AMYLASE in the last 168 hours. No results for input(s): AMMONIA in the last 168 hours. CBC: Recent Labs  Lab 11/04/23 1415 11/05/23 0448 11/06/23 0412  WBC 11.1* 10.9* 5.6  NEUTROABS 9.0* 8.0* 4.1  HGB 11.6* 9.8* 10.1*  HCT 36.1* 31.0* 32.4*  MCV  102.0* 102.6* 102.9*  PLT 146* 126* 117*   Cardiac Enzymes: No results for input(s): CKTOTAL, CKMB, CKMBINDEX, TROPONINI in the last 168 hours. BNP: Invalid input(s): POCBNP CBG: Recent Labs  Lab 11/06/23 1231 11/06/23 1647 11/06/23 2108 11/07/23 0740 11/07/23 1121  GLUCAP 132* 88 188* 134* 97   D-Dimer No results for input(s): DDIMER in the last 72 hours. Hgb A1c Recent Labs    11/04/23 1726  HGBA1C 6.0*   Lipid Profile No results for input(s): CHOL, HDL, LDLCALC, TRIG, CHOLHDL, LDLDIRECT in the last 72 hours. Thyroid function studies No results for input(s): TSH, T4TOTAL, T3FREE, THYROIDAB in the last 72 hours.  Invalid input(s): FREET3 Anemia work up Recent Labs    11/05/23 0448  VITAMINB12 151*  FOLATE 14.1  TIBC 270  IRON 17*   Urinalysis    Component Value Date/Time   COLORURINE YELLOW 11/04/2023 1453   APPEARANCEUR CLEAR 11/04/2023 1453   LABSPEC 1.016 11/04/2023 1453   PHURINE 5.0 11/04/2023 1453   GLUCOSEU >=500 (A) 11/04/2023 1453   HGBUR NEGATIVE 11/04/2023 1453   BILIRUBINUR NEGATIVE 11/04/2023 1453   KETONESUR NEGATIVE 11/04/2023 1453   PROTEINUR NEGATIVE 11/04/2023 1453   UROBILINOGEN 0.2 06/14/2014 0318   NITRITE POSITIVE (A) 11/04/2023 1453   LEUKOCYTESUR TRACE (A) 11/04/2023 1453   Sepsis Labs Recent Labs  Lab 11/04/23 1415 11/05/23 0448 11/06/23 0412  WBC 11.1* 10.9* 5.6   Microbiology Recent Results (from the past 240 hours)  Blood Culture (routine x 2)     Status: None (Preliminary result)   Collection Time: 11/04/23  2:15 PM   Specimen: BLOOD LEFT HAND  Result Value Ref Range Status   Specimen Description   Final     BLOOD LEFT HAND BOTTLES DRAWN AEROBIC AND ANAEROBIC   Special Requests Blood Culture adequate volume  Final   Culture   Final    NO GROWTH 3 DAYS Performed at [facility], [address] [place], [place] [address]    Report Status PENDING  Incomplete  Resp panel by RT-PCR (RSV, Flu A&B, Covid) Anterior Nasal Swab     Status: None   Collection Time: 11/04/23  2:18 PM   Specimen: Anterior Nasal Swab  Result Value Ref Range Status   SARS Coronavirus 2 by RT PCR NEGATIVE NEGATIVE Final    Comment: (NOTE) SARS-CoV-2 target nucleic acids are NOT DETECTED.  The SARS-CoV-2 RNA is generally detectable in upper respiratory specimens during the acute phase of infection. The lowest concentration of SARS-CoV-2 viral copies this assay can detect is 138 copies/mL. A negative result does not preclude SARS-Cov-2 infection and should not be used as the sole basis for treatment or other patient management decisions. A negative result may occur with  improper specimen collection/handling, submission of specimen other than nasopharyngeal swab, presence of viral mutation(s) within the areas targeted by this assay, and inadequate number of viral copies(<138 copies/mL). A negative result must be combined with clinical observations, patient history, and epidemiological information. The expected result is Negative.  Fact Sheet for Patients:  BloggerCourse.com  Fact Sheet for Healthcare Providers:  SeriousBroker.it  This test is no t yet approved or cleared by the [place]  FDA and  has been authorized for detection and/or diagnosis of SARS-CoV-2 by FDA under an Emergency Use Authorization (EUA). This EUA will remain  in effect (meaning this test can be used) for the duration of the COVID-19 declaration under Section 564(b)(1) of the Act, 21 U.S.C.section 360bbb-3(b)(1), unless the authorization is terminated  or  revoked sooner.       Influenza  A by PCR NEGATIVE NEGATIVE Final   Influenza B by PCR NEGATIVE NEGATIVE Final    Comment: (NOTE) The Xpert Xpress SARS-CoV-2/FLU/RSV plus assay is intended as an aid in the diagnosis of influenza from Nasopharyngeal swab specimens and should not be used as a sole basis for treatment. Nasal washings and aspirates are unacceptable for Xpert Xpress SARS-CoV-2/FLU/RSV testing.  Fact Sheet for Patients: BloggerCourse.com  Fact Sheet for Healthcare Providers: SeriousBroker.it  This test is not yet approved or cleared by the [place]  FDA and has been authorized for detection and/or diagnosis of SARS-CoV-2 by FDA under an Emergency Use Authorization (EUA). This EUA will remain in effect (meaning this test can be used) for the duration of the COVID-19 declaration under Section 564(b)(1) of the Act, 21 U.S.C. section 360bbb-3(b)(1), unless the authorization is terminated or revoked.     Resp Syncytial Virus by PCR NEGATIVE NEGATIVE Final    Comment: (NOTE) Fact Sheet for Patients: BloggerCourse.com  Fact Sheet for Healthcare Providers: SeriousBroker.it  This test is not yet approved or cleared by the [place]  FDA and has been authorized for detection and/or diagnosis of SARS-CoV-2 by FDA under an Emergency Use Authorization (EUA). This EUA will remain in effect (meaning this test can be used) for the duration of the COVID-19 declaration under Section 564(b)(1) of the Act, 21 U.S.C. section 360bbb-3(b)(1), unless the authorization is terminated or revoked.  Performed at [facility], [address] [place], [place] [address]   Blood Culture (routine x 2)     Status: Abnormal (Preliminary result)   Collection Time: 11/04/23  2:32 PM   Specimen: BLOOD RIGHT HAND  Result Value Ref Range Status   Specimen Description   Final    BLOOD RIGHT HAND BOTTLES DRAWN AEROBIC AND  ANAEROBIC Performed at [facility], [address] [place], [place] [address]    Special Requests   Final    Blood Culture adequate volume Performed at [facility], [address], [place], [place] [address]    Culture  Setup Time   Final    AEROBIC BOTTLE ONLY GRAM NEGATIVE RODS Gram Stain Report Called to,Read Back By and Verified With: K THOMAS AT 1441 ON 08.12.25 BY ADGER J RN K. BAIL [id number] @2032  FH    Culture (A)  Final    SERRATIA MARCESCENS SUSCEPTIBILITIES TO FOLLOW Performed at [facility], [address] [place], [place] [address]    Report Status PENDING  Incomplete  Blood Culture ID Panel (Reflexed)     Status: Abnormal   Collection Time: 11/04/23  2:32 PM  Result Value Ref Range Status   Enterococcus faecalis NOT DETECTED NOT DETECTED Final   Enterococcus Faecium NOT DETECTED NOT DETECTED Final   Listeria monocytogenes NOT DETECTED NOT DETECTED Final   Staphylococcus species NOT DETECTED NOT DETECTED Final   Staphylococcus aureus (BCID) NOT DETECTED NOT DETECTED Final   Staphylococcus epidermidis NOT DETECTED NOT DETECTED Final   Staphylococcus lugdunensis NOT DETECTED NOT DETECTED Final   Streptococcus species NOT DETECTED NOT DETECTED Final   Streptococcus agalactiae NOT DETECTED NOT DETECTED Final   Streptococcus pneumoniae NOT DETECTED NOT DETECTED Final   Streptococcus pyogenes NOT DETECTED NOT DETECTED Final   A.calcoaceticus-baumannii NOT DETECTED NOT DETECTED Final   Bacteroides fragilis NOT DETECTED NOT DETECTED Final   Enterobacterales DETECTED (A) NOT DETECTED Final    Comment: Enterobacterales represent a large order of gram negative bacteria, not a single  organism. CRITICAL RESULT CALLED TO, READ BACK BY AND VERIFIED WITH: RN K. BAIL [id number] @2032  FH    Enterobacter cloacae complex NOT DETECTED NOT DETECTED Final   Escherichia coli NOT DETECTED NOT DETECTED Final   Klebsiella aerogenes NOT DETECTED NOT DETECTED Final   Klebsiella oxytoca  NOT DETECTED NOT DETECTED Final   Klebsiella pneumoniae NOT DETECTED NOT DETECTED Final   Proteus species NOT DETECTED NOT DETECTED Final   Salmonella species NOT DETECTED NOT DETECTED Final   Serratia marcescens DETECTED (A) NOT DETECTED Final    Comment: CRITICAL RESULT CALLED TO, READ BACK BY AND VERIFIED WITH: RN K. BAIL [id number] @2032  FH    Haemophilus influenzae NOT DETECTED NOT DETECTED Final   Neisseria meningitidis NOT DETECTED NOT DETECTED Final   Pseudomonas aeruginosa NOT DETECTED NOT DETECTED Final   Stenotrophomonas maltophilia NOT DETECTED NOT DETECTED Final   Candida albicans NOT DETECTED NOT DETECTED Final   Candida auris NOT DETECTED NOT DETECTED Final   Candida glabrata NOT DETECTED NOT DETECTED Final   Candida krusei NOT DETECTED NOT DETECTED Final   Candida parapsilosis NOT DETECTED NOT DETECTED Final   Candida tropicalis NOT DETECTED NOT DETECTED Final   Cryptococcus neoformans/gattii NOT DETECTED NOT DETECTED Final   CTX-M ESBL NOT DETECTED NOT DETECTED Final   Carbapenem resistance IMP NOT DETECTED NOT DETECTED Final   Carbapenem resistance KPC NOT DETECTED NOT DETECTED Final   Carbapenem resistance NDM NOT DETECTED NOT DETECTED Final   Carbapenem resist OXA 48 LIKE NOT DETECTED NOT DETECTED Final   Carbapenem resistance VIM NOT DETECTED NOT DETECTED Final    Comment: Performed at [facility], [address] [place], [place] [address]  Urine Culture     Status: Abnormal   Collection Time: 11/04/23  2:53 PM   Specimen: Urine, Random  Result Value Ref Range Status   Specimen Description   Final    URINE, RANDOM Performed at [facility], [address], [place], [place] [address]    Special Requests   Final    NONE Reflexed from [id number] Performed at [facility], [address], [place], [place] [address]    Culture >=100,000 COLONIES/mL SERRATIA MARCESCENS (A)  Final   Report Status 11/07/2023 FINAL  Final   Organism ID, Bacteria SERRATIA MARCESCENS  (A)  Final      Susceptibility   Serratia marcescens - MIC*    CEFEPIME  <=0.12 SENSITIVE Sensitive     ERTAPENEM <=0.12 SENSITIVE Sensitive     CEFTRIAXONE  <=0.25 SENSITIVE Sensitive     CIPROFLOXACIN <=0.06 SENSITIVE Sensitive     GENTAMICIN  <=1 SENSITIVE Sensitive     NITROFURANTOIN 128 RESISTANT Resistant     TRIMETH/SULFA <=20 SENSITIVE Sensitive     MEROPENEM <=0.25 SENSITIVE Sensitive     * >=100,000 COLONIES/mL SERRATIA MARCESCENS  MRSA Next Gen by PCR, Nasal     Status: None   Collection Time: 11/04/23  4:42 PM   Specimen: Nasal Mucosa; Nasal Swab  Result Value Ref Range Status   MRSA by PCR Next Gen NOT DETECTED NOT DETECTED Final    Comment: (NOTE) The GeneXpert MRSA Assay (FDA approved for NASAL specimens only), is one component of a comprehensive MRSA colonization surveillance program. It is not intended to diagnose MRSA infection nor to guide or monitor treatment for MRSA infections. Test performance is not FDA approved in patients less than 75 years old. Performed at [facility], [address], [place], [place] [address]   Culture, blood (x 2)  Status: None (Preliminary result)   Collection Time: 11/04/23  5:26 PM   Specimen: BLOOD  Result Value Ref Range Status   Specimen Description BLOOD RIGHT ANTECUBITAL  Final   Special Requests   Final    BOTTLES DRAWN AEROBIC AND ANAEROBIC Blood Culture adequate volume   Culture   Final    NO GROWTH 3 DAYS Performed at [facility], [address] [place], [place] [address]    Report Status PENDING  Incomplete  Culture, blood (x 2)     Status: None (Preliminary result)   Collection Time: 11/04/23  5:26 PM   Specimen: BLOOD  Result Value Ref Range Status   Specimen Description BLOOD BLOOD RIGHT HAND  Final   Special Requests   Final    BOTTLES DRAWN AEROBIC AND ANAEROBIC Blood Culture adequate volume   Culture   Final    NO GROWTH 3 DAYS Performed at [facility], [address], [place], [place] [address]     Report Status PENDING  Incomplete    Time coordinating discharge:  42 mins  SIGNED:  Afton Louder, MD  [facility] 11/07/2023, 12:24 PM How to contact the [facility] Attending or Consulting provider 7A - 7P or covering provider during after hours 7P -7A, for this patient?  Check the care team in [facility] and look for a) attending/consulting TRH provider listed and b) the TRH team listed Log into [email or web address] and use [place]'s universal password to access. If you do not have the password, please contact the hospital operator. Locate the TRH provider you are looking for under [facility] and page to a number that you can be directly reached. If you still have difficulty reaching the provider, please page the [facility] (Director on Call) for the Hospitalists listed on amion for assistance.

## 2023-11-07 NOTE — TOC Transition Note (Signed)
 Transition of Care [facility]) - Discharge Note   Patient Details  Name: Rickey Rice. MRN: [id number] Date of Birth: [birth year]  [facility] CM/SW Contact:  Lucie Lunger, LCSWA Phone Number: 11/07/2023, 10:39 AM  Clinical Narrative:    CSW updated that pt will likely D/C home today. CSW updated [name] with [facility] of plan for d/c home today. CSW requested that MD place [facility] PT orders. HH agency info added to AVS. TOC signing off.   Final next level of care: Home w Home Health Services Barriers to Discharge: Barriers Resolved   Patient Goals and CMS Choice Patient states their goals for this hospitalization and ongoing recovery are:: return home CMS Medicare.gov Compare Post Acute Care list provided to:: Patient Choice offered to / list presented to : Patient      Discharge Placement                       Discharge Plan and Services Additional resources added to the After Visit Summary for   In-house Referral: Clinical Social Work Discharge Planning Services: CM Consult Post Acute Care Choice: Home Health                    HH Arranged: PT [facility] Agency: [facility] Care Date [facility] Agency Contacted: 11/07/23   Representative spoke with at [facility] Agency: [name]  Social Drivers of Health (SDOH) Interventions SDOH Screenings   Food Insecurity: No Food Insecurity (11/04/2023)  Housing: Low Risk  (11/04/2023)  Transportation Needs: No Transportation Needs (11/04/2023)  Utilities: Not At Risk (11/04/2023)  Social Connections: Moderately Integrated (11/04/2023)  Tobacco Use: Low Risk  (11/04/2023)     Readmission Risk Interventions     No data to display

## 2023-11-07 NOTE — Plan of Care (Signed)

## 2023-11-07 NOTE — Discharge Instructions (Signed)
   IMPORTANT INFORMATION: PAY CLOSE ATTENTION   PHYSICIAN DISCHARGE INSTRUCTIONS  Follow with Primary care provider  Benjamine Aland, MD  and other consultants as instructed by your Hospitalist Physician  SEEK MEDICAL CARE OR RETURN TO EMERGENCY ROOM IF SYMPTOMS COME BACK, WORSEN OR NEW PROBLEM DEVELOPS   Please note: You were cared for by a hospitalist during your hospital stay. Every effort will be made to forward records to your primary care provider.  You can request that your primary care provider send for your hospital records if they have not received them.  Once you are discharged, your primary care physician will handle any further medical issues. Please note that NO REFILLS for any discharge medications will be authorized once you are discharged, as it is imperative that you return to your primary care physician (or establish a relationship with a primary care physician if you do not have one) for your post hospital discharge needs so that they can reassess your need for medications and monitor your lab values.  Please get a complete blood count and chemistry panel checked by your Primary MD at your next visit, and again as instructed by your Primary MD.  Get Medicines reviewed and adjusted: Please take all your medications with you for your next visit with your Primary MD  Laboratory/radiological data: Please request your Primary MD to go over all hospital tests and procedure/radiological results at the follow up, please ask your primary care provider to get all Hospital records sent to his/her office.  In some cases, they will be blood work, cultures and biopsy results pending at the time of your discharge. Please request that your primary care provider follow up on these results.  If you are diabetic, please bring your blood sugar readings with you to your follow up appointment with primary care.    Please call and make your follow up appointments as soon as possible.    Also Note  the following: If you experience worsening of your admission symptoms, develop shortness of breath, life threatening emergency, suicidal or homicidal thoughts you must seek medical attention immediately by calling 911 or calling your MD immediately  if symptoms less severe.  You must read complete instructions/literature along with all the possible adverse reactions/side effects for all the Medicines you take and that have been prescribed to you. Take any new Medicines after you have completely understood and accpet all the possible adverse reactions/side effects.   Do not drive when taking Pain medications or sleeping medications (Benzodiazepines)  Do not take more than prescribed Pain, Sleep and Anxiety Medications. It is not advisable to combine anxiety,sleep and pain medications without talking with your primary care practitioner  Special Instructions: If you have smoked or chewed Tobacco  in the last 2 yrs please stop smoking, stop any regular Alcohol  and or any Recreational drug use.  Wear Seat belts while driving.  Do not drive if taking any narcotic, mind altering or controlled substances or recreational drugs or alcohol.

## 2023-11-08 ENCOUNTER — Telehealth: Payer: Self-pay

## 2023-11-08 LAB — CULTURE, BLOOD (ROUTINE X 2): Special Requests: ADEQUATE

## 2023-11-08 NOTE — Patient Instructions (Signed)
 Visit Information  Thank you for taking time to visit with me today. Please don't hesitate to contact me if I can be of assistance to you before our next scheduled telephone appointment.  Our next appointment is by telephone on August 19 at 1500 (3 pm)  Following is a copy of your care plan:   Goals Addressed             This Visit's Progress    [facility] Transitions of Care (TOC) Care Plan       Problems:  Recent Hospitalization for treatment of Sepsis UTI Medications reviewed from AVS and DC Summary; Furosemide  not on AVS but to restart in DC Summary notes x 2  Goal:  Over the next 30 days, the patient will not experience hospital readmission  Interventions:  Transitions of Care: Doctor Visits  - discussed the importance of doctor visits Communication with PCP re: Medications changes while inpatient and BP monitoring Reviewed Signs and symptoms of infection Medication reviewed and the importance of PCP follow up and cardiology, has appointments for both  Patient Self Care Activities:  Attend all scheduled provider appointments Call pharmacy for medication refills 3-7 days in advance of running out of medications Call provider office for new concerns or questions  Notify RN Care Manager of TOC call rescheduling needs Participate in Transition of Care Program/Attend TOC scheduled calls Take medications as prescribed    Plan:  The care management team will reach out to the patient again over the next 2-7 days. The patient has been provided with contact information for the care management team and has been advised to call with any health related questions or concerns.         Patient verbalizes understanding of instructions and care plan provided today and agrees to view in MyChart. Active MyChart status and patient understanding of how to access instructions and care plan via MyChart confirmed with patient.     The patient has been provided with contact information for the  care management team and has been advised to call with any health related questions or concerns.  Next PCP appointment scheduled for:   Please call the care guide team at [phone] if you need to cancel or reschedule your appointment.   Please call the [place]  National Suicide Prevention Lifeline: 769-548-2105 or TTY: 986 631 1614 TTY 267 722 2886) to talk to a trained counselor if you are experiencing a Mental Health or Behavioral Health Crisis or need someone to talk to.  Richerd Fish, RN, BSN, CCM [facility], [facility] RN Care Manager Direct Dial: [phone]

## 2023-11-08 NOTE — Transitions of Care (Post Inpatient/ED Visit) (Signed)
 11/08/2023  Name: Rickey Rice. MRN: [id number] DOB: 08-Nov-1940  Today's TOC FU Call Status: Today's TOC FU Call Status:: Successful TOC FU Call Completed TOC FU Call Complete Date: 11/08/23 Patient's Name and Date of Birth confirmed.  Transition Care Management Follow-up Telephone Call Date of Discharge: 11/07/23 [facility] Type of Discharge: Inpatient Admission Primary Inpatient Discharge Diagnosis:: Sepsis How have you been since you were released from the hospital?: Better  Items Reviewed: Did you receive and understand the discharge instructions provided?: Yes Medications obtained,verified, and reconciled?: Yes (Medications Reviewed) Any new allergies since your discharge?: No Dietary orders reviewed?: Yes Type of Diet Ordered:: Heart healthy low sodium Do you have support at home?: Yes People in Home [RPT]: spouse Name of Support/Comfort Primary Source: wife Merlynn; son supportive Paschal  Medications Reviewed Today: AVS had Furosemide  missing, reviewed DC summary to clarify Medications Reviewed Today     Reviewed by Eilleen Richerd GRADE, RN (Registered Nurse) on 11/08/23 at 1133  Med List Status: <None>   Medication Order Taking? Sig Documenting Provider Last Dose Status Informant  aspirin  EC 81 MG tablet [id number]  Take 81 mg by mouth daily. [provider]  Active Self, Pharmacy Records  brimonidine  (ALPHAGAN ) 0.2 % ophthalmic solution [id number]  Place 1 drop into the left eye 3 (three) times daily. [provider]  Active Self, Pharmacy Records  carboxymethylcellulose (REFRESH PLUS) 0.5 % SOLN [id number]  Place 1 drop into the right eye in the morning and at bedtime. [provider]  Active Self, Pharmacy Records  carvedilol  (COREG ) 12.5 MG tablet [id number]  Take 0.5 tablets (6.25 mg total) by mouth 2 (two) times daily with a meal. [name], MD  Active Self, Pharmacy Records  CINNAMON PO [id number]  Take  1,000 mg by mouth daily. [provider]  Active Self, Pharmacy Records  dapagliflozin  propanediol (FARXIGA ) 10 MG TABS tablet [id number]  Take 10 mg by mouth daily. [provider]  Active Self, Pharmacy Records  glucose blood test strip [id number]  USE AS DIRECTED TO CHECK BLOOD SUGAR BID [provider]  Active Self, Pharmacy Records  JANUVIA 100 MG tablet [id number]  Take 100 mg by mouth daily. [provider]  Active Self, Pharmacy Records  levofloxacin  (LEVAQUIN ) 500 MG tablet [id number]  Take 1 tablet (500 mg total) by mouth daily for 6 days. Vicci Afton CROME, MD  Active   levothyroxine  (SYNTHROID , LEVOTHROID) 75 MCG tablet [id number]  Take 75 mcg by mouth See admin instructions. Take 75 mcg daily on Monday through Saturday, skip Sundays [provider]  Active Self, Pharmacy Records  Magnesium  250 MG CAPS [id number]  Take 250 mg by mouth daily. [provider]  Active Self, Pharmacy Records  Multiple Vitamins-Minerals (ICAPS AREDS 2) CAPS [id number]  Take 1 capsule by mouth 2 (two) times daily. [provider]  Active Self, Pharmacy Records  Omega-3 Fatty Acids (OMEGA 3 PO) [id number]  Take 1 capsule by mouth 2 (two) times daily. [provider]  Active Self, Pharmacy Records  rosuvastatin  (CRESTOR ) 20 MG tablet [id number]  Take 20 mg by mouth daily. [provider]  Active Self, Pharmacy Records  sacubitril -valsartan  (ENTRESTO ) 97-103 MG [id number]  TAKE 1 TABLET BY MOUTH TWICE DAILY [name], [name], MD  Active   spironolactone  (ALDACTONE ) 25 MG tablet [id number]  TAKE 1/2 TABLET(12.5 MG) BY MOUTH DAILY  Patient taking differently: Take 12.5 mg by mouth daily. TAKE 1/2 TABLET(12.5 MG) BY MOUTH DAILY  Mona [name], MD  Active Self, Pharmacy Records  TURMERIC PO [id number]  Take 1 tablet by mouth 2 (two) times daily. [provider]  Active Self, Pharmacy Records            Home Care and  Equipment/Supplies: Were Home Health Services Ordered?: Yes Name of Home Health Agency:: [facility] Has Agency set up a time to come to your home?: No (Angency called yesterday to set up time but patient told them he would call back, he called back and left a message) Any new equipment or medical supplies ordered?: NA  Functional Questionnaire: Do you need assistance with bathing/showering or dressing?: No Do you need assistance with meal preparation?: No Do you need assistance with eating?: No Do you have difficulty maintaining continence: No Do you need assistance with getting out of bed/getting out of a chair/moving?: No Do you have difficulty managing or taking your medications?: No  Follow up appointments reviewed: PCP Follow-up appointment confirmed?: Yes Date of PCP follow-up appointment?: 11/15/23 Follow-up Provider: Benjiman Leech, MD Specialist Hospital Follow-up appointment confirmed?: Yes Date of Specialist follow-up appointment?: 11/14/23 Follow-Up Specialty Provider:: Cardiology - Dr. Mona Do you need transportation to your follow-up appointment?: No Do you understand care options if your condition(s) worsen?: Yes-patient verbalized understanding  SDOH Interventions Today    Flowsheet Row Most Recent Value  SDOH Interventions   Food Insecurity Interventions Intervention Not Indicated  Housing Interventions Intervention Not Indicated  Transportation Interventions Intervention Not Indicated  Utilities Interventions Intervention Not Indicated    Goals Addressed             This Visit's Progress    [facility] Transitions of Care (TOC) Care Plan       Problems:  Recent Hospitalization for treatment of Sepsis UTI Medications reviewed from AVS and DC Summary; Furosemide  not on AVS but to restart in DC Summary notes x 2  Goal:  Over the next 30 days, the patient will not experience hospital readmission  Interventions:  Transitions of Care: Doctor Visits  - discussed the  importance of doctor visits Communication with PCP re: Medications changes while inpatient and BP monitoring Reviewed Signs and symptoms of infection Medication reviewed and the importance of PCP follow up and cardiology, has appointments for both  Patient Self Care Activities:  Attend all scheduled provider appointments Call pharmacy for medication refills 3-7 days in advance of running out of medications Call provider office for new concerns or questions  Notify RN Care Manager of TOC call rescheduling needs Participate in Transition of Care Program/Attend TOC scheduled calls Take medications as prescribed    Plan:  The care management team will reach out to the patient again over the next 2-7 days. The patient has been provided with contact information for the care management team and has been advised to call with any health related questions or concerns.          Richerd Fish, RN, BSN, CCM [facility], [facility] RN Care Manager Direct Dial: [phone]

## 2023-11-09 LAB — CULTURE, BLOOD (ROUTINE X 2)
Culture: NO GROWTH
Culture: NO GROWTH
Culture: NO GROWTH
Special Requests: ADEQUATE
Special Requests: ADEQUATE
Special Requests: ADEQUATE

## 2023-11-12 ENCOUNTER — Other Ambulatory Visit: Payer: Self-pay | Admitting: Internal Medicine

## 2023-11-12 ENCOUNTER — Ambulatory Visit: Attending: Internal Medicine

## 2023-11-12 DIAGNOSIS — N390 Urinary tract infection, site not specified: Secondary | ICD-10-CM | POA: Diagnosis not present

## 2023-11-12 DIAGNOSIS — I5022 Chronic systolic (congestive) heart failure: Secondary | ICD-10-CM | POA: Diagnosis not present

## 2023-11-12 DIAGNOSIS — I428 Other cardiomyopathies: Secondary | ICD-10-CM | POA: Diagnosis not present

## 2023-11-12 DIAGNOSIS — N182 Chronic kidney disease, stage 2 (mild): Secondary | ICD-10-CM | POA: Diagnosis not present

## 2023-11-12 DIAGNOSIS — I130 Hypertensive heart and chronic kidney disease with heart failure and stage 1 through stage 4 chronic kidney disease, or unspecified chronic kidney disease: Secondary | ICD-10-CM | POA: Diagnosis not present

## 2023-11-12 DIAGNOSIS — E039 Hypothyroidism, unspecified: Secondary | ICD-10-CM | POA: Diagnosis not present

## 2023-11-12 DIAGNOSIS — E1122 Type 2 diabetes mellitus with diabetic chronic kidney disease: Secondary | ICD-10-CM | POA: Diagnosis not present

## 2023-11-12 DIAGNOSIS — I447 Left bundle-branch block, unspecified: Secondary | ICD-10-CM | POA: Diagnosis not present

## 2023-11-12 DIAGNOSIS — I5042 Chronic combined systolic (congestive) and diastolic (congestive) heart failure: Secondary | ICD-10-CM | POA: Diagnosis not present

## 2023-11-12 DIAGNOSIS — Z95810 Presence of automatic (implantable) cardiac defibrillator: Secondary | ICD-10-CM

## 2023-11-12 DIAGNOSIS — B9689 Other specified bacterial agents as the cause of diseases classified elsewhere: Secondary | ICD-10-CM | POA: Diagnosis not present

## 2023-11-12 MED ORDER — SACUBITRIL-VALSARTAN 97-103 MG PO TABS: 1.0000 | ORAL_TABLET | Freq: Two times a day (BID) | ORAL | 3 refills | Status: DC

## 2023-11-13 ENCOUNTER — Telehealth: Payer: Self-pay

## 2023-11-13 ENCOUNTER — Other Ambulatory Visit: Payer: Self-pay

## 2023-11-13 NOTE — Patient Instructions (Signed)
 Visit Information  Thank you for taking time to visit with me today. Please don't hesitate to contact me if I can be of assistance to you before our next scheduled telephone appointment.  Our next appointment is by telephone on Friday November 23, 2023 at 10:00 AM  Following is a copy of your care plan:   Goals Addressed             This Visit's Progress    VBCI Transitions of Care (TOC) Care Plan       Problems:  Recent Hospitalization for treatment of Sepsis UTI Medications reviewed from AVS and DC Summary; Furosemide  not on AVS but to restart in DC Summary notes x 2 Ongoing post hospital follow up: upcoming appointments  Goal:  Over the next 30 days, the patient will not experience hospital readmission  Interventions:  Transitions of Care: Doctor Visits  - discussed the importance of doctor visits Communication with PCP re: Medications changes while inpatient and BP monitoring Reviewed Signs and symptoms of infection Medication reviewed and the importance of PCP follow up and cardiology, has appointments for both 11/13/23 Has appointment with cardiology 11/14/23; PCP follow up 11/15/23 10:00 am  Patient Self Care Activities:  Attend all scheduled provider appointments Call pharmacy for medication refills 3-7 days in advance of running out of medications Call provider office for new concerns or questions  Notify RN Care Manager of TOC call rescheduling needs Participate in Transition of Care Program/Attend TOC scheduled calls Take medications as prescribed    Plan:  The care management team will reach out to the patient again over the next 2-7 days. The patient has been provided with contact information for the care management team and has been advised to call with any health related questions or concerns.  Continue to follow up on BP's daily and Blood sugars        Patient verbalizes understanding of instructions and care plan provided today and agrees to view in  MyChart. Active MyChart status and patient understanding of how to access instructions and care plan via MyChart confirmed with patient.     The patient has been provided with contact information for the care management team and has been advised to call with any health related questions or concerns.  The care management team will reach out to the patient again over the next 5 - 10 business days.   Please call the care guide team at [phone] if you need to cancel or reschedule your appointment.   Please call the [place]  National Suicide Prevention Lifeline: 5486623839 or TTY: 307-209-0521 TTY [phone]) to talk to a trained counselor if you are experiencing a Mental Health or Behavioral Health Crisis or need someone to talk to.  Richerd Fish, RN, BSN, CCM [facility], [facility] RN Care Manager Direct Dial: [phone]

## 2023-11-14 ENCOUNTER — Ambulatory Visit: Attending: Cardiology | Admitting: Internal Medicine

## 2023-11-14 VITALS — BP 98/60 | HR 65 | Ht 71.5 in | Wt 284.6 lb

## 2023-11-14 DIAGNOSIS — E119 Type 2 diabetes mellitus without complications: Secondary | ICD-10-CM | POA: Diagnosis not present

## 2023-11-14 DIAGNOSIS — I959 Hypotension, unspecified: Secondary | ICD-10-CM | POA: Diagnosis not present

## 2023-11-14 DIAGNOSIS — I5022 Chronic systolic (congestive) heart failure: Secondary | ICD-10-CM | POA: Diagnosis not present

## 2023-11-14 DIAGNOSIS — I10 Essential (primary) hypertension: Secondary | ICD-10-CM | POA: Diagnosis not present

## 2023-11-14 MED ORDER — SACUBITRIL-VALSARTAN 49-51 MG PO TABS: 1.0000 | ORAL_TABLET | Freq: Two times a day (BID) | ORAL | 11 refills | Status: AC

## 2023-11-14 NOTE — Progress Notes (Signed)
 OFFICE NOTE  Chief Complaint:  Follow-up heart failure  Primary Care Physician: Benjamine Aland, MD  HPI:  Rickey Rice.  is a 83 year old gentleman with a history of diabetes, hypertension, hypothyroidism, and morbid obesity. In 2010 he had complaints of chest pain and underwent stress testing with [facility] which was a 2-day nuclear stress test and was apparently negative. Recently he underwent a colonoscopy and a preoperative EKG was abnormal. I unfortunately do not have that EKG to review, but I did review the EKG from your office on April 09, 2012 which showed a borderline intraventricular conduction delay, a sinus rhythm at 66, and poor R-wave progression. Today in the office he has an abnormal EKG demonstrating a left bundle branch block with a QRS duration of 168 msec, heart rate of 78 in sinus. His only symptoms are shortness of breath with exertion. He underwent evaluation of his left bundle branch block in February 2014. This is a 2 day nuclear stress test which was negative for ischemia. There was a small inferior and apical defect which could represent scar versus artifact. EF was mildly reduced at 49%.    Rickey Rice returns today for followup. He underwent knee replacement surgery as we had authorized him to do. He is tolerated surgery without any complications. He reports that he is recovering fairly well and is more mobile than he had been in the past. He started to do some exercises at the [facility] and also this will translate into weight loss. Blood pressure has been stable he denies any chest pain.  He had blood work in January from his primary care provider which showed a total cholesterol of 128, HDL 51, triglycerides 53 and LDL 65. His hemoglobin A1c was 6.2.  Rickey Rice is now contemplating lethargy. Unfortunately has not been able to walk enough to lose weight in fact has gained some weight. 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 He feels like it's time to retire work on his generalized health. I would tend to agree with this.  I saw Rickey Rice back in the office today. He recently followed up from the hospital after having a sudden cardiac arrest. Unfortunately he was revived and is now status post AICD. He was to have CRT-D therapy, however there was difficulty with the LV lead, therefore he only has a single ventricle pacing with defibrillator functions. He is tolerating this well and had a small episode with fever postoperatively, but this was thought to be due to possibly a UTI. There is no evidence of pacemaker site fluctuance or swelling. Overall he feels he is doing well. He started exercising is managed to lose some weight. He is less short of breath and is more active.  Rickey Rice returns today for follow-up. Overall he is doing exceedingly well. He's managed to lose about 10 pounds. He is now below 300 pounds. His shortness of breath is improved some and he is ambulating better on his knees. He's had no device problems including no firings. He denies any chest pain or worsening shortness of breath and has not had any presyncopal or syncopal episodes.  Rickey Rice returns today for follow-up. Unfortunately he's gained some of his weight back. He denies a chest pain or worsening shortness of breath. He does not have any swelling. His last EF was as mentioned 40% in March 2016. EKG shows left bundle branch block. He scheduled to see Dr. Fernande back for device  interrogation next month.  11/12/2015  Rickey Rice was seen today in follow-up. Unfortunately he was recently seen for some weight gain and fatigue by Shona Shad, PA-C. She felt that he might be getting some extra fluid and recommended a short trial of increased Lasix . Weight is actually been fairly stable now at 300-301. Around that time he was found to have a UTI was treated for that and his symptoms improved. He was also noted to have  problems with infection and underwent multiple rounds of antibiotics. This led to problems with diarrhea or digestive problems and ultimately he went on probiotics. Recently, though he was talking on the phone and had an episode of transient global amnesia shortly after his wife left the house. He called her back and she came home and noted that he may have had an episode with his defibrillator. This was subsequently confirmed to be VT and he underwent a shock for that. He is seen Dr. Fernande who increased his carvedilol  to 12-1/2 mg twice a day. Blood pressure is actually a little low today but he reports being asymptomatic with that.  02/15/2016  Rickey Rice returns today for follow-up. He reports feeling well and denies worsening shortness of breath or chest pain. HE has not had any presyncopal spells. He just saw EP who noted his defibrillator was working properly. We again discussed strategies for heart failure management, including possibly switching lisinopril  over to Entresto . He did not seem interested in this. It is noted his bp is higher today and that may at least allow uptitration of his lisinopril .  05/29/2016  Rickey Rice was seen today in follow-up. Weight is down slightly although has not made a significant improvement. He needs to start to do more exercise but he has blamed the weather and his arthritis. Blood pressure appears to be well-controlled and he brought readings in the office today. Although I do feel that there still potentially some room to consider adding Entresto . We discussed the medication today and it would effectively replace his lisinopril . He understands he would need to wash off the lisinopril  for 36-48 hours before starting Entresto . We can provide him with samples and help him secure preauthorization.  12/28/2017  Rickey Rice is seen today in follow-up heart failure.  He reports NYHA class II symptoms, noting some shortness of breath with moderate exertion.  He has  recently had some weight loss.  He seems to be tolerating Entresto  24/26.  According to Dr. Celine notes his LV lead is now activated.  He does appear to be biventricular pacing today.  Labs from July showed total cholesterol 143, HDL 52, LDL 79 triglycerides 42, hemoglobin A1c 5.8.  BNP in November was 155.  Recent remote check showed no change in thoracic impedance suggesting that he is compensated with his heart failure.  We previously discussed increasing the dose of Entresto  as blood pressure would tolerate.  He is interested in getting a flu shot today.  11/13/2018  Mr. Ruscitti is seen today back for follow-up of heart failure.  He continues to have NYHA class II symptoms.  He is struggling with weight.  Weight recently has gone up about 6 pounds.  He has been on the moderate dose of Entresto .  Unfortunately repeat echo earlier this year showed no significant improvement in LV function with his EF being 20 to 25%.  This is despite activation of his LV lead and biventricular pacing.  Remote check show stable thoracic impedance and no evidence of volume  overload.  Finally, he has been diagnosed with obstructive sleep apnea and will begin treatment shortly.  Hopefully this will be additionally helpful.  02/13/2019  Mr. Magnan returns today for follow-up of his heart failure.  Overall he continues to have NYHA class II symptoms.  He is been able to do most activities without incident.  LVEF remains depressed.  There is been no evidence of volume overload recently on his biventricular pacemaker.  Remote checks are undergoing.  Weight is down a few pounds.  He is try to make some dietary changes.  I added Aldactone  and he is now on highest dose Entresto .  Plan is to repeat an echo likely in a few months to see if there is been any interval improvement in LV function.  Also, he was diagnosed with obstructive sleep apnea but is not yet been fitted with a mask.  He said when he was treated with that during  the titration study, the next day he felt the best he is ever felt was able to do many activities and generally had a good energy level.  Hopefully based on his insurance plan he will be able to get equipment beginning in January.  01/07/2020  Mr. Card seen today in follow-up of his heart failure.  He underwent an echo in February 2021 which does show a small improvement in LVEF up to 35 to 40%.  He continues to have remote defibrillator checks which have been fairly normal.  He denies any shocks.  He reports fairly stable fluid status although weight has gone up to about 312 pounds today from around 300 pounds in the past.  He reports less physical activity and needs to make more appropriate dietary changes.  Despite this blood pressure is well controlled today 116/70.  EKG shows a biventricular paced rhythm.  Lipid profile shows total cholesterol 124, HDL 46, LDL 65 and triglycerides of 47.  Hemoglobin A1c of 5.6.  05/18/2021  Mr. Murguia returns today for follow-up.  In January he told me that he had a call from Dr. Fernande that his device going off.  I do not see any evidence of this although he was noted to have mode switching and was noted to be in A-fib.  Dr. Fernande noted that he had SCAF (subclinical device detected afib) -I assured him that if he had had a shock that he would have been aware of it.  He is overdue for an echocardiogram last was in February 2021 which showed EF up to 35 to 40%.  He has had weight gain but no worsening edema.  His wife had COVID-19 a few weeks ago but was treated for that.  He tested negative.  Recent labs 5 days ago showed total cholesterol 142, HDL 52, LDL 77 and triglycerides 57, A1c 6.4% creatinine 1.59.  11/29/2021  Mr. Mallon is seen today for follow-up of heart failure.  Overall he says he is doing well.  Fairly recently had an increase in edema.  He had apparently cut back his furosemide  to 10 mg daily.  He said that he was prescribed that although I never  typically will prescribe this low of a dose.  I had thought he was on 20 mg daily.  His PCP added 40 mg to the 10 mg daily dose for about a week at which time he had significant diuresis and improvement in lower extremity edema.  Subsequent to that he had a pacemaker check which showed hypovolemia.  He has subsequently gone  back to the 10 mg dose, he believes but he will go home and check that dose.  He denies any chest pain.  He has had no further ICD firing.  He has a in person defibrillator check with Dr. Fernande in October.  EKG today shows a biventricular paced rhythm at 62.  06/15/2022  Mr. Humbarger returns today for follow-up.  Overall he says he is doing pretty well.  He has had remote checks and saw Dr. Fernande for his defibrillator back in October.  He was felt to be euvolemic.  This spring in February he had an increase in his OptiVol's but they did return back to normal.  He has used increased Lasix  at times but recently has had increased fluid intake.  Overall he denies any chest pain and has had no syncopal episodes.  He will get short of breath with marked exertion.  He is still doing some photography.  Blood pressure is well-controlled today.  He was trialed on Ozempic for a while for weight loss but had some issues with it and stopped.  He intends to get back to doing some exercise.  12/05/2022  Mr. Hengel is seen today in follow-up.  Overall he says he is feeling well.  His remote device checks indicate that he has had no worsening fluid accumulation.  He has an in office check with Dr. Fernande in December.  He was told recently that he has about 9 months left until elective replacement interval (ERI).  He denies any worsening shortness of breath or chest pain although he reports that he has had some muscle loss and wants to get back to exercising at the [facility].  He is on guideline directed medical therapy for heart failure with his most recent LVEF last year of between 40 to 45%.  He has had no  further device shocks to my knowledge.  11/14/2023  Mr. Seliga is seen today in follow-up.  He was recently hospitalized after a fall in which he lost strength in his legs.  He was subsequently found to have a urinary tract infection and possible sepsis with hypotension.  His diuretic was held and his Entresto  dose was reduced during his hospitalization but apparently restarted at home doses afterwards.  Blood pressure initially recovered but has been reduced after hospitalization.  On 10/16/2023 his blood pressure was 90/40.  Today's blood pressure is 98/60.  Home readings are around the 100 systolic.  Overall he says he is feeling somewhat better.  He is not dizzy or presyncopal with this.  Given the fact that he had a UTI, however I have concerned about him being on Farxiga  which was used for both diabetes and heart failure.  While hospitalized he had no signs of decompensated heart failure including a Reds vest measurement which was within normal limits.  He is also seen EP recently and felt to have stable OptiVol's.  He did have a BiV ICD generator change out by Dr. Fernande in April 2025.  Prior to his hospitalization he has been exercising a lot more and weight had come down from 305 down to 284 today which could explain some of the hypotension in the setting of his blood pressure medications.  PMHx:  Past Medical History:  Diagnosis Date   AICD (automatic cardioverter/defibrillator) present    Arthritis    oa   Cardiac arrest (HCC) 06/07/2014   Primary VF arrest with successful resuscitation and s/p ICD implant   Diabetes mellitus without complication (HCC)  type 2   Dyslipidemia    History of nuclear stress test 04/2012   lexiscan  - 2 day protocol; low risk study, evidence of inferrior & apical scar but no ischemia    Hypertension    Hypothyroidism    LBBB (left bundle branch block)    Morbid obesity (HCC)    NICM (nonischemic cardiomyopathy) (HCC)    Prostate enlargement    pt denies     Past Surgical History:  Procedure Laterality Date   BI-VENTRICULAR IMPLANTABLE CARDIOVERTER DEFIBRILLATOR N/A 06/18/2014   STJ CRTD implanted by Dr Fernande   BIV ICD GENERATOR CHANGEOUT N/A 07/17/2023   Procedure: BIV ICD GENERATOR CHANGEOUT;  Surgeon: [name], MD;  Location: [facility] INVASIVE CV LAB;  Service: Cardiovascular;  Laterality: N/A;   COLONOSCOPY W/ BIOPSIES     COLONOSCOPY WITH PROPOFOL  N/A 04/05/2017   Procedure: COLONOSCOPY WITH PROPOFOL ;  Surgeon: Kristie Lamprey, MD;  Location: [facility] ENDOSCOPY;  Service: Endoscopy;  Laterality: N/A;   FINGER SURGERY  1954   1 st joint  right hand amputated   KNEE ARTHROSCOPY Left    LEFT HEART CATHETERIZATION WITH CORONARY ANGIOGRAM N/A 06/12/2014   Procedure: LEFT HEART CATHETERIZATION WITH CORONARY ANGIOGRAM;  Surgeon: Alm LELON Clay, MD;  Location: [facility] CATH LAB;  Service: Cardiovascular;  Laterality: N/A;   MOUTH SURGERY  1963   TOTAL KNEE ARTHROPLASTY Right 02/05/2013   Procedure: TOTAL KNEE ARTHROPLASTY;  Surgeon: Dempsey JINNY Sensor, MD;  Location: [facility] OR;  Service: Orthopedics;  Laterality: Right;   TOTAL KNEE ARTHROPLASTY Left 01/19/2014   Procedure: LEFT TOTAL KNEE ARTHROPLASTY;  Surgeon: Dempsey JINNY Sensor, MD;  Location: [facility] OR;  Service: Orthopedics;  Laterality: Left;    FAMHx:  Family History  Problem Relation Age of Onset   Cancer Mother    Kidney disease Brother    Hypertension Sister     SOCHx:   reports that he has never smoked. He has never used smokeless tobacco. He reports current alcohol use. He reports that he does not use drugs.  ALLERGIES:  Allergies  Allergen Reactions   Bee Venom Swelling    Swelling on lips and tongue   Demerol  [Meperidine ] Other (See Comments)    Unknown    Sulfa Antibiotics Other (See Comments)    Unknown   Pioglitazone Swelling    Actos  Lip swelling   Shellfish-Derived Products Rash   Shrimp Extract Rash    Only fresh shrimp    ROS: Pertinent items noted in HPI and remainder of  comprehensive ROS otherwise negative.  HOME MEDS: Current Outpatient Medications  Medication Sig Dispense Refill   aspirin  EC 81 MG tablet Take 81 mg by mouth daily.     brimonidine  (ALPHAGAN ) 0.2 % ophthalmic solution Place 1 drop into the left eye 3 (three) times daily.     carboxymethylcellulose (REFRESH PLUS) 0.5 % SOLN Place 1 drop into the right eye in the morning and at bedtime.     carvedilol  (COREG ) 12.5 MG tablet Take 0.5 tablets (6.25 mg total) by mouth 2 (two) times daily with a meal. 90 tablet 3   CINNAMON PO Take 1,000 mg by mouth daily.     dapagliflozin  propanediol (FARXIGA ) 10 MG TABS tablet Take 10 mg by mouth daily.     DM-Doxylamine-Acetaminophen  (CORICIDIN HBP NIGHT CLD/FLU MS PO) Take 30 mLs by mouth 3 (three) times daily.     furosemide  (LASIX ) 20 MG tablet Take 20 mg by mouth daily.     glucose blood test  strip USE AS DIRECTED TO CHECK BLOOD SUGAR BID     JANUVIA 100 MG tablet Take 100 mg by mouth daily.     levothyroxine  (SYNTHROID , LEVOTHROID) 75 MCG tablet Take 75 mcg by mouth See admin instructions. Take 75 mcg daily on Monday through Saturday, skip Sundays     Magnesium  250 MG CAPS Take 250 mg by mouth daily.     Multiple Vitamins-Minerals (ICAPS AREDS 2) CAPS Take 1 capsule by mouth 2 (two) times daily.     Omega-3 Fatty Acids (OMEGA 3 PO) Take 1 capsule by mouth 2 (two) times daily.     rosuvastatin  (CRESTOR ) 20 MG tablet Take 20 mg by mouth daily.     sacubitril -valsartan  (ENTRESTO ) 97-103 MG Take 1 tablet by mouth 2 (two) times daily. 180 tablet 3   spironolactone  (ALDACTONE ) 25 MG tablet TAKE 1/2 TABLET(12.5 MG) BY MOUTH DAILY 45 tablet 1   TURMERIC PO Take 1 tablet by mouth 2 (two) times daily.     No current facility-administered medications for this visit.    LABS/IMAGING: No results found for this or any previous visit (from the past 48 hours). No results found.  VITALS: Ht 5' 11.5 (1.816 m)   Wt 284 lb 9.6 oz (129.1 kg)   BMI 39.14 kg/m    EXAM: General appearance: alert, no distress and morbidly obese Neck: no carotid bruit, no JVD and thyroid not enlarged, symmetric, no tenderness/mass/nodules Lungs: clear to auscultation bilaterally and AICD in left upper chest Heart: regular rate and rhythm, S1, S2 normal, no murmur, click, rub or gallop Abdomen: soft, non-tender; bowel sounds normal; no masses,  no organomegaly and Morbidly obese Extremities: extremities normal, atraumatic, no cyanosis or edema Pulses: 2+ and symmetric Skin: Skin color, texture, turgor normal. No rashes or lesions  EKG: Deferred   ASSESSMENT: Chronic systolic congestive heart failure, NYHA class II symptoms Status post aborted sudden cardiac death-status post placement of a [employer] Bi-V AICD (LV lead is disabled due to dysfunction) Recent VT/VF with appropriate AICD shock Negative nuclear stress test for ischemia, fixed inferoapical defect suggesting possible scar versus artifact, EF 49% Diabetes type 2 Hypertension-well controlled Dyslipidemia on statin - well controlled Non-ischemic cardiomyopathy-EF 20-25%, improved to 35 to 40% (04/2019), possibly improved up to 40 to 45% by echo (2023) 8.   OSA -on CPAP, compliant  PLAN: 1.   Mr. Durrell which is hospitalized with a fall and subsequently found to have UTI and possible urosepsis with some hypotension.  Some of his heart failure meds were held although he had no signs of decompensated heart failure.  A repeat echo was not performed.  He has blood pressure improved somewhat but is now low again on his home meds.  I think this might be also related to a 25 pound weight loss that he has had over the past several months with exercise.  I advised decreasing his Entresto  down to 49/51 mg twice daily.  He also will need to stop Farxiga  as UTI is a contraindication to the medication and may be related.  He has had good glycemic control with that but also is on Januvia and may need additional therapies  through his primary care provider.  He has an upcoming appointment with Dr. Benjamine.  I think she would agree that this medication is contraindicated with unexplained UTI, especially in a male who is typically at lower risk for UTI.  Overall he seems compensated with regards to heart failure.  Will plan follow-up in  about 6 months.  In the future he will likely follow-up with Dr. Almetta for cardiac EP in [place].  Vinie KYM Maxcy, MD, [facility], FNLA, FACP  [place]  [facility]  Medical Director of the [facility]  Card[facility]ovascular [facility] Diplomate of the American Board of Clinical Lipidology Attending Cardiologist  Direct Dial: [phone]  Fax: [phone]  Website:  [email or web address]   [name] Ahna Konkle 11/14/2023, 9:05 AM

## 2023-11-14 NOTE — Transitions of Care (Post Inpatient/ED Visit) (Signed)
 Transition of Care week 2  Visit Note  11/13/2023  Name: Rickey Rice. MRN: [id number]          DOB: 1941-02-25  Situation: Patient enrolled in [facility] 30-day program. Visit completed with patient by telephone.   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   Diabetes mellitus without complication (HCC)    type 2   Dyslipidemia    History of nuclear stress test 04/2012   lexiscan  - 2 day protocol; low risk study, evidence of inferrior & apical scar but no ischemia    Hypertension    Hypothyroidism    LBBB (left bundle branch block)    Morbid obesity (HCC)    NICM (nonischemic cardiomyopathy) (HCC)    Prostate enlargement    pt denies    Assessment: Patient Reported Symptoms: Cognitive Cognitive Status: Alert and oriented to person, place, and time, Normal speech and language skills      Neurological Neurological Review of Symptoms: No symptoms reported Neurological Management Strategies: Activity, Adequate rest, Medication therapy Neurological Self-Management Outcome: 4 (good)  HEENT HEENT Symptoms Reported: No symptoms reported HEENT Management Strategies: Activity, Adequate rest    Cardiovascular Cardiovascular Symptoms Reported: No symptoms reported Does patient have uncontrolled Hypertension?: Yes Is patient checking Blood Pressure at home?: Yes Patient's Recent BP reading at home: 110/64 102/64 91/50 104/57 108/61 Cardiovascular Management Strategies: Activity, Adequate rest, Medical device Weight: 284 lb (128.8 kg) Cardiovascular Self-Management Outcome: 4 (good)  Respiratory Respiratory Symptoms Reported: No symptoms reported Other Respiratory Symptoms: much better Respiratory Management Strategies: Activity,  CPAP, Medication therapy Respiratory Self-Management Outcome: 4 (good)  Endocrine Endocrine Symptoms Reported: No symptoms reported Is patient diabetic?: Yes Is patient checking blood sugars at home?: Yes List most recent blood sugar readings, include date and time of day: 128  11/12/23   0930 Endocrine Self-Management Outcome: 4 (good)  Gastrointestinal Gastrointestinal Symptoms Reported: No symptoms reported Gastrointestinal Management Strategies: Activity, Medication therapy    Genitourinary Genitourinary Symptoms Reported: No symptoms reported Genitourinary Management Strategies: Activity, Adequate rest  Integumentary Integumentary Symptoms Reported: No symptoms reported Skin Self-Management Outcome: 4 (good)  Musculoskeletal Musculoskelatal Symptoms Reviewed: No symptoms reported Additional Musculoskeletal Details: getting better Musculoskeletal Management Strategies: Activity, Adequate rest Musculoskeletal Self-Management Outcome: 4 (good) Falls in the past year?: Yes Number of falls in past year: 1 or less Was there an injury with Fall?: Yes Fall Risk Category Calculator: 2 Patient Fall Risk Level: Moderate Fall Risk Patient at Risk for Falls Due to: History of fall(s)  Psychosocial Psychosocial Symptoms Reported: No symptoms reported         Vitals:   11/13/23 1502  BP: 108/61  Pulse: 62    Medications Reviewed Today     Reviewed by Eilleen Richerd GRADE, RN (Registered Nurse) on 11/13/23 at 1513  Med List Status: <None>   Medication Order Taking? Sig Documenting Provider Last Dose Status Informant  aspirin  EC 81 MG tablet [id number] Yes Take 81 mg by mouth daily. [provider]  Active Self, Pharmacy Records  brimonidine  (ALPHAGAN ) 0.2 % ophthalmic solution [id number] Yes Place 1 drop into the left eye 3 (three) times daily. [provider]  Active Self, Pharmacy Records  carboxymethylcellulose (REFRESH PLUS) 0.5 % SOLN [id number] Yes Place  1 drop into  the right eye in the morning and at bedtime. [provider]  Active Self, Pharmacy Records  carvedilol  (COREG ) 12.5 MG tablet [id number] Yes Take 0.5 tablets (6.25 mg total) by mouth 2 (two) times daily with a meal. [name], MD  Active Self, Pharmacy Records  CINNAMON PO [id number] Yes Take 1,000 mg by mouth daily. [provider]  Active Self, Pharmacy Records  dapagliflozin  propanediol (FARXIGA ) 10 MG TABS tablet [id number] Yes Take 10 mg by mouth daily. [provider]  Active Self, Pharmacy Records  DM-Doxylamine-Acetaminophen  (CORICIDIN HBP NIGHT CLD/FLU MS PO) [id number] Yes Take 30 mLs by mouth 3 (three) times daily. [provider]  Active   furosemide  (LASIX ) 20 MG tablet [id number] Yes Take 20 mg by mouth daily. [provider]  Active   glucose blood test strip [id number] Yes USE AS DIRECTED TO CHECK BLOOD SUGAR BID [provider]  Active Self, Pharmacy Records  JANUVIA 100 MG tablet [id number] Yes Take 100 mg by mouth daily. [provider]  Active Self, Pharmacy Records  levofloxacin  (LEVAQUIN ) 500 MG tablet [id number] Yes Take 1 tablet (500 mg total) by mouth daily for 6 days. Vicci Afton CROME, MD  Active   levothyroxine  (SYNTHROID , LEVOTHROID) 75 MCG tablet [id number] Yes Take 75 mcg by mouth See admin instructions. Take 75 mcg daily on Monday through Saturday, skip Sundays [provider]  Active Self, Pharmacy Records  Magnesium  250 MG CAPS [id number] Yes Take 250 mg by mouth daily. [provider]  Active Self, Pharmacy Records  Multiple Vitamins-Minerals (ICAPS AREDS 2) CAPS [id number] Yes Take 1 capsule by mouth 2 (two) times daily. [provider]  Active Self, Pharmacy Records  Omega-3 Fatty Acids (OMEGA 3 PO) [id number] Yes Take 1 capsule by mouth 2 (two) times daily. [provider]  Active Self, Pharmacy Records  rosuvastatin  (CRESTOR ) 20 MG tablet [id number] Yes Take 20 mg by mouth  daily. [provider]  Active Self, Pharmacy Records  sacubitril -valsartan  (ENTRESTO ) 97-103 MG [id number] Yes Take 1 tablet by mouth 2 (two) times daily. Mona [name], MD  Active   spironolactone  (ALDACTONE ) 25 MG tablet [id number] Yes TAKE 1/2 TABLET(12.5 MG) BY MOUTH DAILY [name], [name], MD  Active Self, Pharmacy Records  TURMERIC PO [id number] Yes Take 1 tablet by mouth 2 (two) times daily. [provider]  Active Self, Pharmacy Records            Recommendation:   Continue Current Plan of Care   Richerd Fish, RN, BSN, CCM [facility]  [facility], [facility] RN Care Manager Direct Dial: [phone]

## 2023-11-14 NOTE — Patient Instructions (Signed)
 Medication Instructions:  STOP Farxiga   DECREASE dose of Entresto  to 49/51mg  twice daily -- new prescription  CONTINUE other current meds  *If you need a refill on your cardiac medications before your next appointment, please call your pharmacy*  Testing/Procedures: Your physician has requested that you have an echocardiogram at [facility]. Echocardiography is a painless test that uses sound waves to create images of your heart. It provides your doctor with information about the size and shape of your heart and how well your heart's chambers and valves are working. This procedure takes approximately one hour. There are no restrictions for this procedure. Please do NOT wear cologne, perfume, aftershave, or lotions (deodorant is allowed). Please arrive 15 minutes prior to your appointment time.  Please note: We ask at that you not bring children with you during ultrasound (echo/ vascular) testing. Due to room size and safety concerns, children are not allowed in the ultrasound rooms during exams. Our front office staff cannot provide observation of children in our lobby area while testing is being conducted. An adult accompanying a patient to their appointment will only be allowed in the ultrasound room at the discretion of the ultrasound technician under special circumstances. We apologize for any inconvenience.   Follow-Up: At [facility], you and your health needs are our priority.  As part of our continuing mission to provide you with exceptional heart care, our providers are all part of one team.  This team includes your primary Cardiologist (physician) and Advanced Practice Providers or APPs (Physician Assistants and Nurse Practitioners) who all work together to provide you with the care you need, when you need it.  Your next appointment:   6 months with Dr. Mona or PA/NP  We recommend signing up for the patient portal called MyChart.  Sign up information is provided on this  After Visit Summary.  MyChart is used to connect with patients for Virtual Visits (Telemedicine).  Patients are able to view lab/test results, encounter notes, upcoming appointments, etc.  Non-urgent messages can be sent to your provider as well.   To learn more about what you can do with MyChart, go to [email or web address].

## 2023-11-14 NOTE — Progress Notes (Signed)
 EPIC Encounter for ICM Monitoring  Patient Name: Marks Scalera. is a 83 y.o. male Date: 11/14/2023 Primary Care Physican: Benjamine Aland, MD Primary Cardiologist: [facility] Electrophysiologist: Fernande Pore Pacing:  98%       01/29/2023 Weight: 300 lbs 02/27/2023 Office Weight: 301 lbs 06/01/2022 Office Weight: 304 lbs  08/28/2023 Weight: 292 lbs 10/10/2023 Weight: 287 lbs                                        Transmission results reviewed.  Pt hospitalized for UTI from 8/10-8/13 which correlates with decreased impedance during that time.     [place] Thoracic impedance suggesting normal fluid levels with the exception of possible fluid accumulation 8/10-8/15.   Prescribed:  Furosemide  20 mg take 1 tablet by mouth daily. Spironolactone  25 mg take 0.5 tablet (12.5 mg total) by mouth daily   Labs: 11/07/2023 Creatinine 1.33, BUN 20, Potassium 4.1, Sodium 143, GFR 53  11/06/2023 Creatinine 1.23, BUN 18, Potassium 3.8, Sodium 141, GFR 58  11/05/2023 Creatinine 1.50, BUN 20, Potassium 3.6, Sodium 143, GFR 46  11/04/2023 Creatinine 1.54, BUN 20, Potassium 4.0, Sodium 142, GFR 44 07/11/2023 Creatinine 1.59, BUN 27, Potassium 4.2, Sodium 146, GFR 43  06/12/2023 Creatinine 1.59, BUN 21, Potassium 5.0, Sodium 144, GFR 43  A complete set of results can be found in Results Review.   Recommendations:  No changes.   Follow-up plan: ICM clinic phone appointment on 12/17/2023.  91 day device clinic remote transmission 11/28/2023.       EP/Cardiology Office Visits:  11/14/2023 with Dr Mona.  Recall 10/10/2024 with Dr Almetta or EP APP.   Copy of ICM check sent to Dr. Fernande.   3 month ICM trend: 11/12/2023.    12-14 Month ICM trend:     Mitzie GORMAN Garner, RN 11/14/2023 7:15 AM

## 2023-11-14 NOTE — Progress Notes (Signed)
 Spoke with patient and heart failure questions reviewed.  Transmission results reviewed.  Pt he is feeling better since hospital discharge.  He has been running low BP and discussed with Dr Mona today.  Dr Mona is adjusting some of his heart meds today.  11/14/2023 Weight: 284.4 lbs.  No changes and encouraged to call if experiencing any fluid symptoms.

## 2023-11-15 DIAGNOSIS — R413 Other amnesia: Secondary | ICD-10-CM | POA: Diagnosis not present

## 2023-11-15 DIAGNOSIS — E1169 Type 2 diabetes mellitus with other specified complication: Secondary | ICD-10-CM | POA: Diagnosis not present

## 2023-11-15 DIAGNOSIS — E7800 Pure hypercholesterolemia, unspecified: Secondary | ICD-10-CM | POA: Diagnosis not present

## 2023-11-15 DIAGNOSIS — I10 Essential (primary) hypertension: Secondary | ICD-10-CM | POA: Diagnosis not present

## 2023-11-16 DIAGNOSIS — I447 Left bundle-branch block, unspecified: Secondary | ICD-10-CM | POA: Diagnosis not present

## 2023-11-16 DIAGNOSIS — I130 Hypertensive heart and chronic kidney disease with heart failure and stage 1 through stage 4 chronic kidney disease, or unspecified chronic kidney disease: Secondary | ICD-10-CM | POA: Diagnosis not present

## 2023-11-16 DIAGNOSIS — N182 Chronic kidney disease, stage 2 (mild): Secondary | ICD-10-CM | POA: Diagnosis not present

## 2023-11-16 DIAGNOSIS — B9689 Other specified bacterial agents as the cause of diseases classified elsewhere: Secondary | ICD-10-CM | POA: Diagnosis not present

## 2023-11-16 DIAGNOSIS — N390 Urinary tract infection, site not specified: Secondary | ICD-10-CM | POA: Diagnosis not present

## 2023-11-16 DIAGNOSIS — E1122 Type 2 diabetes mellitus with diabetic chronic kidney disease: Secondary | ICD-10-CM | POA: Diagnosis not present

## 2023-11-16 DIAGNOSIS — I5042 Chronic combined systolic (congestive) and diastolic (congestive) heart failure: Secondary | ICD-10-CM | POA: Diagnosis not present

## 2023-11-16 DIAGNOSIS — I428 Other cardiomyopathies: Secondary | ICD-10-CM | POA: Diagnosis not present

## 2023-11-16 DIAGNOSIS — E039 Hypothyroidism, unspecified: Secondary | ICD-10-CM | POA: Diagnosis not present

## 2023-11-17 ENCOUNTER — Other Ambulatory Visit: Payer: Self-pay | Admitting: Internal Medicine

## 2023-11-19 DIAGNOSIS — B9689 Other specified bacterial agents as the cause of diseases classified elsewhere: Secondary | ICD-10-CM | POA: Diagnosis not present

## 2023-11-19 DIAGNOSIS — I447 Left bundle-branch block, unspecified: Secondary | ICD-10-CM | POA: Diagnosis not present

## 2023-11-19 DIAGNOSIS — N182 Chronic kidney disease, stage 2 (mild): Secondary | ICD-10-CM | POA: Diagnosis not present

## 2023-11-19 DIAGNOSIS — E039 Hypothyroidism, unspecified: Secondary | ICD-10-CM | POA: Diagnosis not present

## 2023-11-19 DIAGNOSIS — I428 Other cardiomyopathies: Secondary | ICD-10-CM | POA: Diagnosis not present

## 2023-11-19 DIAGNOSIS — N390 Urinary tract infection, site not specified: Secondary | ICD-10-CM | POA: Diagnosis not present

## 2023-11-19 DIAGNOSIS — I5042 Chronic combined systolic (congestive) and diastolic (congestive) heart failure: Secondary | ICD-10-CM | POA: Diagnosis not present

## 2023-11-19 DIAGNOSIS — I130 Hypertensive heart and chronic kidney disease with heart failure and stage 1 through stage 4 chronic kidney disease, or unspecified chronic kidney disease: Secondary | ICD-10-CM | POA: Diagnosis not present

## 2023-11-19 DIAGNOSIS — E1122 Type 2 diabetes mellitus with diabetic chronic kidney disease: Secondary | ICD-10-CM | POA: Diagnosis not present

## 2023-11-20 DIAGNOSIS — H47013 Ischemic optic neuropathy, bilateral: Secondary | ICD-10-CM | POA: Diagnosis not present

## 2023-11-20 DIAGNOSIS — H35033 Hypertensive retinopathy, bilateral: Secondary | ICD-10-CM | POA: Diagnosis not present

## 2023-11-20 DIAGNOSIS — H35373 Puckering of macula, bilateral: Secondary | ICD-10-CM | POA: Diagnosis not present

## 2023-11-20 DIAGNOSIS — H04123 Dry eye syndrome of bilateral lacrimal glands: Secondary | ICD-10-CM | POA: Diagnosis not present

## 2023-11-20 DIAGNOSIS — H0102A Squamous blepharitis right eye, upper and lower eyelids: Secondary | ICD-10-CM | POA: Diagnosis not present

## 2023-11-20 DIAGNOSIS — H401222 Low-tension glaucoma, left eye, moderate stage: Secondary | ICD-10-CM | POA: Diagnosis not present

## 2023-11-20 DIAGNOSIS — H401211 Low-tension glaucoma, right eye, mild stage: Secondary | ICD-10-CM | POA: Diagnosis not present

## 2023-11-21 DIAGNOSIS — N390 Urinary tract infection, site not specified: Secondary | ICD-10-CM | POA: Diagnosis not present

## 2023-11-21 DIAGNOSIS — E039 Hypothyroidism, unspecified: Secondary | ICD-10-CM | POA: Diagnosis not present

## 2023-11-21 DIAGNOSIS — I5042 Chronic combined systolic (congestive) and diastolic (congestive) heart failure: Secondary | ICD-10-CM | POA: Diagnosis not present

## 2023-11-21 DIAGNOSIS — I428 Other cardiomyopathies: Secondary | ICD-10-CM | POA: Diagnosis not present

## 2023-11-21 DIAGNOSIS — N182 Chronic kidney disease, stage 2 (mild): Secondary | ICD-10-CM | POA: Diagnosis not present

## 2023-11-21 DIAGNOSIS — I447 Left bundle-branch block, unspecified: Secondary | ICD-10-CM | POA: Diagnosis not present

## 2023-11-21 DIAGNOSIS — I130 Hypertensive heart and chronic kidney disease with heart failure and stage 1 through stage 4 chronic kidney disease, or unspecified chronic kidney disease: Secondary | ICD-10-CM | POA: Diagnosis not present

## 2023-11-21 DIAGNOSIS — E1122 Type 2 diabetes mellitus with diabetic chronic kidney disease: Secondary | ICD-10-CM | POA: Diagnosis not present

## 2023-11-21 DIAGNOSIS — B9689 Other specified bacterial agents as the cause of diseases classified elsewhere: Secondary | ICD-10-CM | POA: Diagnosis not present

## 2023-11-23 ENCOUNTER — Telehealth: Payer: Self-pay

## 2023-11-23 ENCOUNTER — Other Ambulatory Visit: Payer: Self-pay

## 2023-11-23 NOTE — Patient Instructions (Signed)
 Visit Information  Thank you for taking time to visit with me today. Please don't hesitate to contact me if I can be of assistance to you before our next scheduled telephone appointment.  Our next appointment is by telephone on 11/30/23 at 2 PM  Following is a copy of your care plan:   Goals Addressed             This Visit's Progress    VBCI Transitions of Care (TOC) Care Plan   On track    Problems:  Recent Hospitalization for treatment of Sepsis UTI Medications reviewed from AVS and DC Summary; Furosemide  not on AVS but to restart in DC Summary notes x 2 Ongoing post hospital follow up: upcoming appointments  Goal:  Over the next 30 days, the patient will not experience hospital readmission  Interventions:  Transitions of Care: Doctor Visits  - discussed the importance of doctor visits Communication with PCP re: Medications changes while inpatient and BP monitoring Reviewed Signs and symptoms of infection Medication reviewed and the importance of PCP follow up and cardiology, has appointments for both 11/13/23 Has appointment with cardiology 11/14/23; PCP follow up 11/15/23 10:00 am Follow up with Dr. Benjamine on Sept 10, 2025  Patient Self Care Activities:  Attend all scheduled provider appointments Call pharmacy for medication refills 3-7 days in advance of running out of medications Call provider office for new concerns or questions  Notify RN Care Manager of [facility] call rescheduling needs Participate in Transition of Care Program/Attend [facility] scheduled calls Take medications as prescribed    Plan:  The care management team will reach out to the patient again over the next 2-7 days. The patient has been provided with contact information for the care management team and has been advised to call with any health related questions or concerns.  Continue to follow up on BP's daily and Blood sugars  Reminded about blood sugar monitoring if below 70 to eat a carb and check for  symptoms        Patient verbalizes understanding of instructions and care plan provided today and agrees to view in MyChart. Active MyChart status and patient understanding of how to access instructions and care plan via MyChart confirmed with patient.     The patient has been provided with contact information for the care management team and has been advised to call with any health related questions or concerns.  The care management team will reach out to the patient again over the next 5- 10 business days.   Please call the care guide team at [phone] if you need to cancel or reschedule your appointment.   Please call the [place]  National Suicide Prevention Lifeline: 9146691407 or TTY: 352-045-3104 TTY (570) 411-7909) to talk to a trained counselor if you are experiencing a Mental Health or Behavioral Health Crisis or need someone to talk to.  Richerd Fish, RN, BSN, CCM [facility], [facility] RN Care Manager Direct Dial: [phone]

## 2023-11-23 NOTE — Transitions of Care (Post Inpatient/ED Visit) (Signed)
 Transition of Care week 3  Visit Note  11/23/2023  Name: Rickey Rice. MRN: [id number]          DOB: [birth year]  Situation: Patient enrolled in [facility] 30-day program. Visit completed with pain by telephone.   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   Diabetes mellitus without complication (HCC)    type 2   Dyslipidemia    History of nuclear stress test 04/2012   lexiscan  - 2 day protocol; low risk study, evidence of inferrior & apical scar but no ischemia    Hypertension    Hypothyroidism    LBBB (left bundle branch block)    Morbid obesity (HCC)    NICM (nonischemic cardiomyopathy) (HCC)    Prostate enlargement    pt denies    Assessment: Patient Reported Symptoms: Cognitive Cognitive Status: Alert and oriented to person, place, and time, Normal speech and language skills, Difficulties with attention and concentration (Family HX of dementia with sisters being checked for probability with Dr. Benjamine due to blood test coming back highly likely)      Neurological Neurological Review of Symptoms: No symptoms reported Neurological Management Strategies: Adequate rest, Activity Neurological Self-Management Outcome: 4 (good)  HEENT HEENT Symptoms Reported: No symptoms reported HEENT Management Strategies: Activity, Adequate rest    Cardiovascular Cardiovascular Symptoms Reported: No symptoms reported Does patient have uncontrolled Hypertension?: Yes Is patient checking Blood Pressure at home?: Yes Cardiovascular Management Strategies: Activity, Adequate rest, Medication therapy Weight: 284 lb 6.4 oz (129 kg) Cardiovascular Self-Management Outcome: 4 (good)  Respiratory Respiratory Symptoms Reported: No symptoms  reported Respiratory Management Strategies: Activity, Adequate rest, Mechanical ventilation, Fluid modification (64 oz a day)  Endocrine Endocrine Symptoms Reported: No symptoms reported Is patient diabetic?: Yes Is patient checking blood sugars at home?: Yes List most recent blood sugar readings, include date and time of day: 96 on 11/21/23 0800 Endocrine Self-Management Outcome: 4 (good)  Gastrointestinal Gastrointestinal Symptoms Reported: No symptoms reported      Genitourinary Genitourinary Symptoms Reported: No symptoms reported Genitourinary Management Strategies: Activity, Adequate rest  Integumentary Integumentary Symptoms Reported: No symptoms reported Skin Self-Management Outcome: 4 (good)  Musculoskeletal Musculoskelatal Symptoms Reviewed: No symptoms reported Additional Musculoskeletal Details: better Musculoskeletal Management Strategies: Activity, Adequate rest Musculoskeletal Self-Management Outcome: 4 (good)      Psychosocial Psychosocial Symptoms Reported: No symptoms reported         Vitals:   11/23/23 1005  BP: 100/66  Pulse: 71    Medications Reviewed Today     Reviewed by Eilleen Richerd GRADE, RN (Registered Nurse) on 11/23/23 at 1008  Med List Status: <None>   Medication Order Taking? Sig Documenting Provider Last Dose Status Informant  aspirin  EC 81 MG tablet [id number] Yes Take 81 mg by mouth daily. [provider]  Active Self, Pharmacy Records  brimonidine  (ALPHAGAN ) 0.2 % ophthalmic solution [id number]  Place 1 drop into the left eye 3 (three) times daily. [provider]  Active Self, Pharmacy Records  carboxymethylcellulose (REFRESH PLUS) 0.5 % SOLN [id number] Yes Place 1 drop into the right eye in the morning and at bedtime. [provider]  Active Self, Pharmacy Records  carvedilol  (COREG ) 12.5 MG tablet [id number] Yes Take 0.5 tablets (6.25 mg total) by  mouth 2 (two) times daily with a meal. [name], MD  Active Self,  Pharmacy Records  CINNAMON PO [id number] Yes Take 1,000 mg by mouth daily. [provider]  Active Self, Pharmacy Records  DM-Doxylamine-Acetaminophen  (CORICIDIN HBP NIGHT CLD/FLU MS PO) [id number]  Take 30 mLs by mouth 3 (three) times daily.  Patient not taking: Reported on 11/23/2023   [provider]  Active   furosemide  (LASIX ) 20 MG tablet [id number] Yes Take 20 mg by mouth daily. [provider]  Active   glucose blood test strip [id number] Yes USE AS DIRECTED TO CHECK BLOOD SUGAR BID [provider]  Active Self, Pharmacy Records  JANUVIA 100 MG tablet [id number] Yes Take 100 mg by mouth daily. [provider]  Active Self, Pharmacy Records  levothyroxine  (SYNTHROID , LEVOTHROID) 75 MCG tablet [id number] Yes Take 75 mcg by mouth See admin instructions. Take 75 mcg daily on Monday through Saturday, skip Sundays [provider]  Active Self, Pharmacy Records  Magnesium  250 MG CAPS [id number] Yes Take 250 mg by mouth daily. [provider]  Active Self, Pharmacy Records  Multiple Vitamins-Minerals (ICAPS AREDS 2) CAPS [id number] Yes Take 1 capsule by mouth 2 (two) times daily. [provider]  Active Self, Pharmacy Records  Omega-3 Fatty Acids (OMEGA 3 PO) [id number] Yes Take 1 capsule by mouth 2 (two) times daily. [provider]  Active Self, Pharmacy Records  rosuvastatin  (CRESTOR ) 20 MG tablet [id number] Yes Take 20 mg by mouth daily. [provider]  Active Self, Pharmacy Records  sacubitril -valsartan  (ENTRESTO ) 49-51 MG [id number] Yes Take 1 tablet by mouth 2 (two) times daily. Mona [name], MD  Active   spironolactone  (ALDACTONE ) 25 MG tablet [id number] Yes TAKE 1/2 TABLET(12.5 MG) BY MOUTH DAILY Mona [name], MD  Active   TURMERIC PO [id number] Yes Take 1 tablet by mouth 2 (two) times daily. [provider]  Active Self, Pharmacy Records            Recommendation:   Continue Current Plan of  Care  Follow Up Plan:   Telephone follow-up in 1 week  Richerd Fish, RN, Scientist, research (physical sciences), CCM [employer], [facility] RN Care Manager Direct Dial: [phone]

## 2023-11-28 ENCOUNTER — Ambulatory Visit (INDEPENDENT_AMBULATORY_CARE_PROVIDER_SITE_OTHER)

## 2023-11-28 DIAGNOSIS — I5022 Chronic systolic (congestive) heart failure: Secondary | ICD-10-CM | POA: Diagnosis not present

## 2023-11-29 DIAGNOSIS — E1122 Type 2 diabetes mellitus with diabetic chronic kidney disease: Secondary | ICD-10-CM | POA: Diagnosis not present

## 2023-11-29 DIAGNOSIS — B9689 Other specified bacterial agents as the cause of diseases classified elsewhere: Secondary | ICD-10-CM | POA: Diagnosis not present

## 2023-11-29 DIAGNOSIS — N390 Urinary tract infection, site not specified: Secondary | ICD-10-CM | POA: Diagnosis not present

## 2023-11-29 DIAGNOSIS — E039 Hypothyroidism, unspecified: Secondary | ICD-10-CM | POA: Diagnosis not present

## 2023-11-29 DIAGNOSIS — I5042 Chronic combined systolic (congestive) and diastolic (congestive) heart failure: Secondary | ICD-10-CM | POA: Diagnosis not present

## 2023-11-29 DIAGNOSIS — I447 Left bundle-branch block, unspecified: Secondary | ICD-10-CM | POA: Diagnosis not present

## 2023-11-29 DIAGNOSIS — N182 Chronic kidney disease, stage 2 (mild): Secondary | ICD-10-CM | POA: Diagnosis not present

## 2023-11-29 DIAGNOSIS — I130 Hypertensive heart and chronic kidney disease with heart failure and stage 1 through stage 4 chronic kidney disease, or unspecified chronic kidney disease: Secondary | ICD-10-CM | POA: Diagnosis not present

## 2023-11-29 DIAGNOSIS — I428 Other cardiomyopathies: Secondary | ICD-10-CM | POA: Diagnosis not present

## 2023-11-29 LAB — CUP PACEART REMOTE DEVICE CHECK
Battery Remaining Longevity: 84 mo
Battery Remaining Percentage: 91 %
Battery Voltage: 3.01 V
Brady Statistic AP VP Percent: 1 %
Brady Statistic AP VS Percent: 1 %
Brady Statistic AS VP Percent: 99 %
Brady Statistic AS VS Percent: 1 %
Brady Statistic RA Percent Paced: 1 %
Date Time Interrogation Session: 20250903020430
HighPow Impedance: 62 Ohm
Implantable Lead Connection Status: 753985
Implantable Lead Connection Status: 753985
Implantable Lead Connection Status: 753985
Implantable Lead Implant Date: 20160324
Implantable Lead Implant Date: 20160324
Implantable Lead Implant Date: 20160324
Implantable Lead Location: 753858
Implantable Lead Location: 753859
Implantable Lead Location: 753860
Implantable Lead Model: 7122
Implantable Pulse Generator Implant Date: 20250422
Lead Channel Impedance Value: 400 Ohm
Lead Channel Impedance Value: 490 Ohm
Lead Channel Impedance Value: 510 Ohm
Lead Channel Pacing Threshold Amplitude: 1 V
Lead Channel Pacing Threshold Amplitude: 1 V
Lead Channel Pacing Threshold Amplitude: 1.75 V
Lead Channel Pacing Threshold Pulse Width: 0.5 ms
Lead Channel Pacing Threshold Pulse Width: 0.5 ms
Lead Channel Pacing Threshold Pulse Width: 0.9 ms
Lead Channel Sensing Intrinsic Amplitude: 12 mV
Lead Channel Sensing Intrinsic Amplitude: 2.6 mV
Lead Channel Setting Pacing Amplitude: 2 V
Lead Channel Setting Pacing Amplitude: 2.5 V
Lead Channel Setting Pacing Amplitude: 2.5 V
Lead Channel Setting Pacing Pulse Width: 0.5 ms
Lead Channel Setting Pacing Pulse Width: 0.9 ms
Lead Channel Setting Sensing Sensitivity: 0.5 mV
Pulse Gen Serial Number: 111073116
Zone Setting Status: 755011

## 2023-11-30 ENCOUNTER — Telehealth

## 2023-11-30 ENCOUNTER — Other Ambulatory Visit: Payer: Self-pay

## 2023-11-30 ENCOUNTER — Ambulatory Visit: Payer: Self-pay | Admitting: Internal Medicine

## 2023-11-30 ENCOUNTER — Telehealth: Payer: Self-pay

## 2023-11-30 ENCOUNTER — Ambulatory Visit (HOSPITAL_COMMUNITY)
Admission: RE | Admit: 2023-11-30 | Discharge: 2023-11-30 | Disposition: A | Discharge status: Home / Self Care | Source: Ambulatory Visit | Attending: Internal Medicine | Admitting: Internal Medicine

## 2023-11-30 DIAGNOSIS — I959 Hypotension, unspecified: Secondary | ICD-10-CM | POA: Diagnosis not present

## 2023-11-30 DIAGNOSIS — I5022 Chronic systolic (congestive) heart failure: Secondary | ICD-10-CM | POA: Diagnosis not present

## 2023-11-30 LAB — ECHOCARDIOGRAM COMPLETE
AR max vel: 2.27 cm2
AV Area VTI: 2.4 cm2
AV Area mean vel: 2.33 cm2
AV Mean grad: 2.3 mmHg
AV Peak grad: 4.1 mmHg
Ao pk vel: 1.01 m/s
Area-P 1/2: 2.26 cm2
S' Lateral: 3.7 cm

## 2023-11-30 MED ORDER — PERFLUTREN LIPID MICROSPHERE
1.0000 mL | INTRAVENOUS | Status: AC | PRN
  Administered 2023-11-30: 3 mL via INTRAVENOUS

## 2023-11-30 NOTE — Patient Instructions (Signed)
 Visit Information  Thank you for taking time to visit with me today. Please don't hesitate to contact me if I can be of assistance to you before our next scheduled telephone appointment.  Our next appointment is by telephone on 12/07/23 at 2:15 pm  Following is a copy of your care plan:   Goals Addressed             This Visit's Progress    VBCI Transitions of Care (TOC) Care Plan   On track    Problems:  Recent Hospitalization for treatment of Sepsis UTI Medications reviewed from AVS and DC Summary; Furosemide  not on AVS but to restart in DC Summary notes x 2 Ongoing post hospital follow up: upcoming appointments Diabetes management: blood glucose was a little elevated today due to eating a muffin last evening 12/01/23 Patient paid out-of-pocket for walker/rollator due to the one ordered [[employer]] was too small for his hips and could not navigate walking with it. Was told insurance would not pay because he was under 300 lbs.   Goal:  Over the next 30 days, the patient will not experience hospital readmission  Interventions:  Transitions of Care: Doctor Visits  - discussed the importance of doctor visits Communication with PCP re: Medications changes while inpatient and BP monitoring Reviewed Signs and symptoms of infection Medication reviewed and the importance of PCP follow up and cardiology, has appointments for both 11/13/23 Has appointment with cardiology 11/14/23; PCP follow up 11/15/23 10:00 am 11/30/23 Follow up with Dr. Benjamine on Sept 10, 2025 Diabetes management - discussed how to do leg pumps and some activity after eating high carb food can assist in blood sugar metabolism Rollator issue being too small, encouraged patient/wife to appeal with insurance, call back of insurance card for member services, also speak with PCP about advocating his need due to hip circumference.  Patient Self Care Activities:  Attend all scheduled provider appointments Call pharmacy for  medication refills 3-7 days in advance of running out of medications Call provider office for new concerns or questions  Notify RN Care Manager of TOC call rescheduling needs Participate in Transition of Care Program/Attend TOC scheduled calls Take medications as prescribed    Plan:  The care management team will reach out to the patient again over the next 2-7 days. The patient has been provided with contact information for the care management team and has been advised to call with any health related questions or concerns.  Continue to follow up on BP's daily and Blood sugars  Reminded about blood sugar monitoring if below 70 to eat a carb and check for symptoms patient verbalizes knowing symptoms to watch for and who to follow up with. Has a 2 week follow up with PCP and 2 D Echo on 11/30/23.        Patient verbalizes understanding of instructions and care plan provided today and agrees to view in MyChart. Active MyChart status and patient understanding of how to access instructions and care plan via MyChart confirmed with patient.     Telephone follow up appointment with care management team member scheduled for: The patient has been provided with contact information for the care management team and has been advised to call with any health related questions or concerns.  The patient will call PCP regarding rollator issue* as advised to have PCP assist with appeal for need.   Please call the care guide team at [phone] if you need to cancel or reschedule your  appointment.   Please call the [place]  National Suicide Prevention Lifeline: 980-577-8740 or TTY: 571 506 1916 TTY 339-372-1254) to talk to a trained counselor call 1-800-273-TALK (toll free, 24 hour hotline) go to [facility] [address], [place] [phone]) if you are experiencing a Mental Health or Behavioral Health Crisis or need someone to talk to.  Richerd Fish, RN, BSN,  CCM [facility], [facility] Health RN Care Manager Direct Dial: [phone]

## 2023-11-30 NOTE — Transitions of Care (Post Inpatient/ED Visit) (Signed)
 Transition of Care week 4  Visit Note  11/30/2023  Name: Rickey Rice. MRN: [id number]          DOB: Jun 30, 1940  Situation: Patient enrolled in [facility] 30-day program. Visit completed with patient/wife by telephone.   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   Diabetes mellitus without complication (HCC)    type 2   Dyslipidemia    History of nuclear stress test 04/2012   lexiscan  - 2 day protocol; low risk study, evidence of inferrior & apical scar but no ischemia    Hypertension    Hypothyroidism    LBBB (left bundle branch block)    Morbid obesity (HCC)    NICM (nonischemic cardiomyopathy) (HCC)    Prostate enlargement    pt denies    Assessment: Patient Reported Symptoms: Cognitive Cognitive Status: Able to follow simple commands, Alert and oriented to person, place, and time, Normal speech and language skills      Neurological Neurological Review of Symptoms: No symptoms reported Neurological Management Strategies: Adequate rest, Activity Neurological Self-Management Outcome: 4 (good)  HEENT HEENT Symptoms Reported: No symptoms reported      Cardiovascular Cardiovascular Symptoms Reported: No symptoms reported Does patient have uncontrolled Hypertension?: Yes Is patient checking Blood Pressure at home?: Yes Patient's Recent BP reading at home: 110/60 Cardiovascular Management Strategies: Activity, Adequate rest, Medication therapy Weight: 284 lb 4 oz (128.9 kg) Cardiovascular Self-Management Outcome: 4 (good)  Respiratory Respiratory Symptoms Reported: No symptoms reported Respiratory Management Strategies: Activity, Adequate rest Respiratory Self-Management Outcome: 4 (good)  Endocrine Endocrine Symptoms  Reported: No symptoms reported Is patient diabetic?: Yes Is patient checking blood sugars at home?: Yes List most recent blood sugar readings, include date and time of day: 115 and 132 Endocrine Self-Management Outcome: 4 (good)  Gastrointestinal Gastrointestinal Symptoms Reported: No symptoms reported Gastrointestinal Management Strategies: Activity, Adequate rest, Medication therapy    Genitourinary Genitourinary Symptoms Reported: No symptoms reported Genitourinary Management Strategies: Activity, Adequate rest  Integumentary Integumentary Symptoms Reported: No symptoms reported Skin Self-Management Outcome: 4 (good)  Musculoskeletal Musculoskelatal Symptoms Reviewed: No symptoms reported Additional Musculoskeletal Details: better gait with therapy, using walker Musculoskeletal Management Strategies: Activity, Adequate rest Musculoskeletal Self-Management Outcome: 4 (good) Falls in the past year?: Yes (Working with PT and getting strenght back) Number of falls in past year: 1 or less Was there an injury with Fall?: Yes Fall Risk Category Calculator: 2 Patient Fall Risk Level: Moderate Fall Risk Patient at Risk for Falls Due to: History of fall(s)  Psychosocial Psychosocial Symptoms Reported: No symptoms reported         Vitals:   11/30/23 1405  BP: 110/64    Medications Reviewed Today     Reviewed by Eilleen Richerd GRADE, RN (Registered Nurse) on 11/30/23 at 1401  Med List Status: <None>   Medication Order Taking? Sig Documenting Provider Last Dose Status Informant  aspirin  EC 81 MG tablet [id number]  Take 81 mg by mouth daily. [provider]  Active Self, Pharmacy Records  brimonidine  (ALPHAGAN ) 0.2 % ophthalmic solution [id number]  Place 1 drop into the left eye 3 (three) times daily. [provider]  Active Self, Pharmacy Records  carboxymethylcellulose (REFRESH PLUS) 0.5 % SOLN [id number]  Place 1 drop  into the right eye in the morning and at bedtime.  [provider]  Active Self, Pharmacy Records  carvedilol  (COREG ) 12.5 MG tablet [id number]  Take 0.5 tablets (6.25 mg total) by mouth 2 (two) times daily with a meal. [name], MD  Active Self, Pharmacy Records  CINNAMON PO [id number]  Take 1,000 mg by mouth daily. [provider]  Active Self, Pharmacy Records  DM-Doxylamine-Acetaminophen  (CORICIDIN HBP NIGHT CLD/FLU MS PO) [id number]  Take 30 mLs by mouth 3 (three) times daily.  Patient not taking: Reported on 11/23/2023   [provider]  Active   furosemide  (LASIX ) 20 MG tablet [id number]  Take 20 mg by mouth daily. [provider]  Active   glucose blood test strip [id number]  USE AS DIRECTED TO CHECK BLOOD SUGAR BID [provider]  Active Self, Pharmacy Records  JANUVIA 100 MG tablet [id number]  Take 100 mg by mouth daily. [provider]  Active Self, Pharmacy Records  levothyroxine  (SYNTHROID , LEVOTHROID) 75 MCG tablet [id number]  Take 75 mcg by mouth See admin instructions. Take 75 mcg daily on Monday through Saturday, skip Sundays [provider]  Active Self, Pharmacy Records  Magnesium  250 MG CAPS [id number]  Take 250 mg by mouth daily. [provider]  Active Self, Pharmacy Records  Multiple Vitamins-Minerals (ICAPS AREDS 2) CAPS [id number]  Take 1 capsule by mouth 2 (two) times daily. [provider]  Active Self, Pharmacy Records  Omega-3 Fatty Acids (OMEGA 3 PO) [id number]  Take 1 capsule by mouth 2 (two) times daily. [provider]  Active Self, Pharmacy Records  perflutren  lipid microspheres (DEFINITY ) IV suspension [id number]   Mona [name], MD  Active   rosuvastatin  (CRESTOR ) 20 MG tablet [id number]  Take 20 mg by mouth daily. [provider]  Active Self, Pharmacy Records  sacubitril -valsartan  (ENTRESTO ) 49-51 MG [id number]  Take 1 tablet by mouth 2 (two) times daily. Mona [name], MD  Active   spironolactone  (ALDACTONE ) 25 MG  tablet [id number]  TAKE 1/2 TABLET(12.5 MG) BY MOUTH DAILY Mona [name], MD  Active   TURMERIC PO [id number]  Take 1 tablet by mouth 2 (two) times daily. [provider]  Active Self, Pharmacy Records            Recommendation:   Continue Current Plan of Care  Follow Up Plan:   Telephone follow-up in 1 week  Richerd Fish, RN, Scientist, research (physical sciences), CCM [employer], [facility] RN Care Manager Direct Dial: [phone]

## 2023-11-30 NOTE — Progress Notes (Signed)
*  PRELIMINARY RESULTS* Echocardiogram 2D Echocardiogram has been performed with Definity .  Rickey Rice 11/30/2023, 12:55 PM

## 2023-12-04 DIAGNOSIS — E1122 Type 2 diabetes mellitus with diabetic chronic kidney disease: Secondary | ICD-10-CM | POA: Diagnosis not present

## 2023-12-04 DIAGNOSIS — I428 Other cardiomyopathies: Secondary | ICD-10-CM | POA: Diagnosis not present

## 2023-12-04 DIAGNOSIS — E039 Hypothyroidism, unspecified: Secondary | ICD-10-CM | POA: Diagnosis not present

## 2023-12-04 DIAGNOSIS — N390 Urinary tract infection, site not specified: Secondary | ICD-10-CM | POA: Diagnosis not present

## 2023-12-04 DIAGNOSIS — I130 Hypertensive heart and chronic kidney disease with heart failure and stage 1 through stage 4 chronic kidney disease, or unspecified chronic kidney disease: Secondary | ICD-10-CM | POA: Diagnosis not present

## 2023-12-04 DIAGNOSIS — N182 Chronic kidney disease, stage 2 (mild): Secondary | ICD-10-CM | POA: Diagnosis not present

## 2023-12-04 DIAGNOSIS — B9689 Other specified bacterial agents as the cause of diseases classified elsewhere: Secondary | ICD-10-CM | POA: Diagnosis not present

## 2023-12-04 DIAGNOSIS — I5042 Chronic combined systolic (congestive) and diastolic (congestive) heart failure: Secondary | ICD-10-CM | POA: Diagnosis not present

## 2023-12-04 DIAGNOSIS — I447 Left bundle-branch block, unspecified: Secondary | ICD-10-CM | POA: Diagnosis not present

## 2023-12-04 NOTE — Progress Notes (Signed)
Remote ICD Transmission.

## 2023-12-05 DIAGNOSIS — I10 Essential (primary) hypertension: Secondary | ICD-10-CM | POA: Diagnosis not present

## 2023-12-05 DIAGNOSIS — E1159 Type 2 diabetes mellitus with other circulatory complications: Secondary | ICD-10-CM | POA: Diagnosis not present

## 2023-12-05 DIAGNOSIS — R9402 Abnormal brain scan: Secondary | ICD-10-CM | POA: Diagnosis not present

## 2023-12-05 DIAGNOSIS — G309 Alzheimer's disease, unspecified: Secondary | ICD-10-CM | POA: Diagnosis not present

## 2023-12-07 ENCOUNTER — Other Ambulatory Visit: Payer: Self-pay

## 2023-12-07 ENCOUNTER — Telehealth: Payer: Self-pay

## 2023-12-07 DIAGNOSIS — A419 Sepsis, unspecified organism: Secondary | ICD-10-CM

## 2023-12-07 DIAGNOSIS — N183 Chronic kidney disease, stage 3 unspecified: Secondary | ICD-10-CM

## 2023-12-07 DIAGNOSIS — I10 Essential (primary) hypertension: Secondary | ICD-10-CM

## 2023-12-07 NOTE — Transitions of Care (Post Inpatient/ED Visit) (Signed)
 Transition of Care Final Call Week 5  Visit Note  12/07/2023  Name: Rickey Rice. MRN: [id number]          DOB: [birth year]  Situation: Patient enrolled in [facility] 30-day program. Visit completed with patient/wife by telephone.   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   Diabetes mellitus without complication (HCC)    type 2   Dyslipidemia    History of nuclear stress test 04/2012   lexiscan  - 2 day protocol; low risk study, evidence of inferrior & apical scar but no ischemia    Hypertension    Hypothyroidism    LBBB (left bundle branch block)    Morbid obesity (HCC)    NICM (nonischemic cardiomyopathy) (HCC)    Prostate enlargement    pt denies    Assessment: Patient Reported Symptoms: Cognitive Cognitive Status: Alert and oriented to person, place, and time, Able to follow simple commands, Normal speech and language skills      Neurological Neurological Review of Symptoms: Numbness Neurological Management Strategies: Adequate rest, Activity Neurological Self-Management Outcome: 4 (good) Neurological Comment: Dementia  HEENT HEENT Symptoms Reported: No symptoms reported HEENT Management Strategies: Activity, Adequate rest HEENT Self-Management Outcome: 4 (good)    Cardiovascular Cardiovascular Symptoms Reported: No symptoms reported Does patient have uncontrolled Hypertension?: Yes Is patient checking Blood Pressure at home?: Yes Patient's Recent BP reading at home: 114/69 Cardiovascular Management Strategies: Activity, Adequate rest, Medication therapy Weight: 284 lb (128.8 kg) Cardiovascular Self-Management Outcome: 4 (good)  Respiratory Respiratory Symptoms Reported: No symptoms reported Other Respiratory  Symptoms: much better Respiratory Management Strategies: Activity, Adequate rest  Endocrine Endocrine Symptoms Reported: No symptoms reported Is patient diabetic?: Yes Is patient checking blood sugars at home?: Yes List most recent blood sugar readings, include date and time of day: 109 - 111 Endocrine Self-Management Outcome: 4 (good)  Gastrointestinal Gastrointestinal Symptoms Reported: No symptoms reported Gastrointestinal Management Strategies: Activity, Adequate rest, Medication therapy Gastrointestinal Self-Management Outcome: 4 (good)    Genitourinary Genitourinary Symptoms Reported: No symptoms reported Genitourinary Management Strategies: Activity, Adequate rest Genitourinary Self-Management Outcome: 4 (good)  Integumentary Integumentary Symptoms Reported: No symptoms reported Skin Self-Management Outcome: 4 (good)  Musculoskeletal Musculoskelatal Symptoms Reviewed: No symptoms reported Additional Musculoskeletal Details: Working out at [employer] and seeing a Systems analyst Musculoskeletal Management Strategies: Activity, Adequate rest Musculoskeletal Self-Management Outcome: 4 (good)      Psychosocial Psychosocial Symptoms Reported: No symptoms reported         There were no vitals filed for this visit.  Medications Reviewed Today     Reviewed by Eilleen Richerd GRADE, RN (Registered Nurse) on 12/07/23 at 1425  Med List Status: <None>   Medication Order Taking? Sig Documenting Provider Last Dose Status Informant  aspirin  EC 81 MG tablet [id number]  Take 81 mg by mouth daily. [provider]  Active Self, Pharmacy Records  brimonidine  (ALPHAGAN ) 0.2 % ophthalmic solution [id number]  Place 1 drop into the left eye 3 (three) times daily. [provider]  Active Self, Pharmacy Records  carboxymethylcellulose (REFRESH PLUS) 0.5 % SOLN [id number]  Place 1 drop into the right eye in the morning and at bedtime. [provider]  Active Self, Pharmacy Records   carvedilol  (COREG ) 12.5 MG tablet [id number]  Take 0.5 tablets (6.25 mg total) by mouth 2 (two) times daily with a meal. [name], MD  Active Self, Pharmacy Records  CINNAMON PO [id number]  Take 1,000 mg by mouth daily. [provider]  Active Self, Pharmacy Records  DM-Doxylamine-Acetaminophen  (CORICIDIN HBP NIGHT CLD/FLU MS PO) [id number]  Take 30 mLs by mouth 3 (three) times daily.  Patient not taking: Reported on 11/30/2023   [provider]  Active   furosemide  (LASIX ) 20 MG tablet [id number]  Take 20 mg by mouth daily. [provider]  Active   glucose blood test strip [id number]  USE AS DIRECTED TO CHECK BLOOD SUGAR BID [provider]  Active Self, Pharmacy Records  JANUVIA 100 MG tablet [id number]  Take 100 mg by mouth daily. [provider]  Active Self, Pharmacy Records  levothyroxine  (SYNTHROID , LEVOTHROID) 75 MCG tablet [id number]  Take 75 mcg by mouth See admin instructions. Take 75 mcg daily on Monday through Saturday, skip Sundays [provider]  Active Self, Pharmacy Records  Magnesium  250 MG CAPS [id number]  Take 250 mg by mouth daily. [provider]  Active Self, Pharmacy Records  Multiple Vitamins-Minerals (ICAPS AREDS 2) CAPS [id number]  Take 1 capsule by mouth 2 (two) times daily. [provider]  Active Self, Pharmacy Records  Omega-3 Fatty Acids (OMEGA 3 PO) [id number]  Take 1 capsule by mouth 2 (two) times daily. [provider]  Active Self, Pharmacy Records  rosuvastatin  (CRESTOR ) 20 MG tablet [id number]  Take 20 mg by mouth daily. [provider]  Active Self, Pharmacy Records  sacubitril -valsartan  (ENTRESTO ) 49-51 MG [id number]  Take 1 tablet by mouth 2 (two) times daily. Mona [name], MD  Active   spironolactone  (ALDACTONE ) 25 MG tablet [id number]  TAKE 1/2 TABLET(12.5 MG) BY MOUTH DAILY Mona [name], MD  Active   TURMERIC PO [id number]  Take 1 tablet by mouth 2 (two) times daily.  [provider]  Active Self, Pharmacy Records            Recommendation:   Referral to: [facility]I RN Complex Care Manager  Follow Up Plan:   Referral to RN Case Manager Closing From:  Transitions of Care Program Patient has met all care management goals. Care Management case will be closed. Patient has been provided contact information should new needs arise.   Richerd Fish, RN, BSN, CCM [facility], [facility] RN Care Manager Direct Dial: [phone]

## 2023-12-07 NOTE — Patient Instructions (Signed)
 Visit Information  Thank you for taking time to visit with me today. Please don't hesitate to contact me if I can be of assistance to you before our next scheduled telephone appointment.  Our next appointment is with our [facility] RN CCM within the next 30 days.  Following is a copy of your care plan:   Goals Addressed             This Visit's Progress    COMPLETED: VBCI Transitions of Care (TOC) Care Plan   On track    Problems:  Recent Hospitalization for treatment of Sepsis UTI Medications reviewed from AVS and DC Summary; Furosemide  not on AVS but to restart in DC Summary notes x 2 Ongoing post hospital follow up: upcoming appointments Diabetes management: blood glucose was a little elevated today due to eating a muffin last evening 12/01/23 Patient paid out-of-pocket for walker/rollator due to the one ordered [[employer]] was too small for his hips and could not navigate walking with it. Was told insurance would not pay because he was under 300 lbs patient paid over $175.00 out of pocket. 12/07/23 Patient has contacted and to receive a refund for the walker/rollator. Patient has completed 30 day TOC program.  Goal:  Over the next 30 days, the patient will not experience hospital readmission  Interventions:  Transitions of Care: Doctor Visits  - discussed the importance of doctor visits Communication with PCP re: Medications changes while inpatient and BP monitoring Reviewed Signs and symptoms of infection Medication reviewed and the importance of PCP follow up and cardiology, has appointments for both 11/13/23 Has appointment with cardiology 11/14/23; PCP follow up 11/15/23 10:00 am 11/30/23 Follow up with Dr. Benjamine on Sept 10, 2025 Diabetes management - discussed how to do leg pumps and some activity after eating high carb food can assist in blood sugar metabolism Rollator issue being too small, encouraged patient/wife to appeal with insurance, call back of insurance card for member  services, also speak with PCP about advocating his need due to hip circumference.  Patient Self Care Activities:  Attend all scheduled provider appointments Call pharmacy for medication refills 3-7 days in advance of running out of medications Call provider office for new concerns or questions  Notify RN Care Manager of TOC call rescheduling needs Participate in Transition of Care Program/Attend TOC scheduled calls Take medications as prescribed    Plan:  The care management team will reach out to the patient again over the next 2-7 days. The patient has been provided with contact information for the care management team and has been advised to call with any health related questions or concerns.  Continue to follow up on BP's daily and Blood sugars  Reminded about blood sugar monitoring if below 70 to eat a carb and check for symptoms patient verbalizes knowing symptoms to watch for and who to follow up with. Has a 2 week follow up with PCP and 2 D Echo on 11/30/23  12/07/23 Patient to have a 2 month follow up with Dr. Benjamine 12/07/23 Patient to be referred to [facility] Complex Care Management RN for ongoing follow up        Patient verbalizes understanding of instructions and care plan provided today and agrees to view in MyChart. Active MyChart status and patient understanding of how to access instructions and care plan via MyChart confirmed with patient.     The patient has been provided with contact information for the care management team and has been advised  to call with any health related questions or concerns.  The care management team will reach out to the patient again over the next   days.  Patient has successfully completed the 30 day TOC program.  Please call the care guide team at [phone] if you need to cancel or reschedule your appointment.   Please call the [place]  National Suicide Prevention Lifeline: 239 748 0450 or TTY: 559-463-3747 TTY (727)130-9775) to talk to a trained  counselor if you are experiencing a Mental Health or Behavioral Health Crisis or need someone to talk to.  Richerd Fish, RN, BSN, CCM [facility], [facility] RN Care Manager Direct Dial: [phone]

## 2023-12-17 ENCOUNTER — Ambulatory Visit: Attending: Student in an Organized Health Care Education/Training Program

## 2023-12-17 DIAGNOSIS — I5022 Chronic systolic (congestive) heart failure: Secondary | ICD-10-CM

## 2023-12-17 DIAGNOSIS — Z95810 Presence of automatic (implantable) cardiac defibrillator: Secondary | ICD-10-CM | POA: Diagnosis not present

## 2023-12-17 NOTE — Progress Notes (Signed)
 Spoke with patient and heart failure questions reviewed.  Transmission results reviewed.  Pt asymptomatic for fluid accumulation.  Reports feeling well at this time and voices no complaints.  12/17/2023 weight: 284 lbs.  He is feeling well at this time.  He reports he has not been pleased with [facility].  Discussed the benefits of using [facility] and the closest one to him is [employer] [facility] [employer].

## 2023-12-17 NOTE — Progress Notes (Signed)
 EPIC Encounter for ICM Monitoring  Patient Name: Rickey Rice. is a 83 y.o. male Date: 12/17/2023 Primary Care Physican: Benjamine Aland, MD Primary Cardiologist: [facility] Electrophysiologist: Almetta Pore Pacing:  98%       01/29/2023 Weight: 300 lbs 02/27/2023 Office Weight: 301 lbs 06/01/2022 Office Weight: 304 lbs  08/28/2023 Weight: 292 lbs 10/10/2023 Weight: 287 lbs 11/14/2023 Weight: 284.4 lbs                                        Attempted call to patient and unable to reach.  Left detailed message per DPR regarding transmission.  Transmission results reviewed.    [place] Thoracic impedance suggesting normal fluid levels with the exception of possible fluid accumulation 9/3-9/7.   Prescribed:  Furosemide  20 mg take 1 tablet by mouth daily. Spironolactone  25 mg take 0.5 tablet (12.5 mg total) by mouth daily   Labs: 11/07/2023 Creatinine 1.33, BUN 20, Potassium 4.1, Sodium 143, GFR 53  11/06/2023 Creatinine 1.23, BUN 18, Potassium 3.8, Sodium 141, GFR 58  11/05/2023 Creatinine 1.50, BUN 20, Potassium 3.6, Sodium 143, GFR 46  11/04/2023 Creatinine 1.54, BUN 20, Potassium 4.0, Sodium 142, GFR 44 07/11/2023 Creatinine 1.59, BUN 27, Potassium 4.2, Sodium 146, GFR 43  06/12/2023 Creatinine 1.59, BUN 21, Potassium 5.0, Sodium 144, GFR 43  A complete set of results can be found in Results Review.   Recommendations:  Left voice mail with ICM number and encouraged to call if experiencing any fluid symptoms.   Follow-up plan: ICM clinic phone appointment on 01/21/2024.  91 day device clinic remote transmission 02/27/2024.       EP/Cardiology Office Visits:  Recall 05/12/2024 with Dr Mona or APP.  Recall 10/10/2024 with Dr Almetta or EP APP.   Copy of ICM check sent to Dr. Almetta.   3 month ICM trend: 12/17/2023.    12-14 Month ICM trend:     Mitzie GORMAN Garner, RN 12/17/2023 10:15 AM

## 2023-12-19 ENCOUNTER — Ambulatory Visit: Payer: Self-pay | Admitting: Cardiology

## 2023-12-19 ENCOUNTER — Other Ambulatory Visit: Payer: Self-pay

## 2023-12-20 NOTE — Patient Instructions (Signed)
 Visit Information  Thank you for taking time to visit with me today. Please don't hesitate to contact me if I can be of assistance to you before our next scheduled appointment.  Our next appointment is by telephone on Wednesday, October 8th at 1:30pm. Please call the care guide team at [phone] if you need to cancel or reschedule your appointment.   Following is a copy of your care plan:   Goals Addressed   None     Please call the [place]  National Suicide Prevention Lifeline: (512)456-3858 or TTY: 425-679-0589 TTY 2366315103) to talk to a trained counselor if you are experiencing a Mental Health or Behavioral Health Crisis or need someone to talk to.  Patient verbalizes understanding of instructions and care plan provided today and agrees to view in MyChart. Active MyChart status and patient understanding of how to access instructions and care plan via MyChart confirmed with patient.     Santana Stamp BSN, CCM [facility]  [facility] Population Health RN Care Manager Direct Dial: [phone]  Fax: [phone]

## 2023-12-20 NOTE — Patient Outreach (Signed)
 Complex Care Management   Visit Note  12/20/2023  Name:  Rickey Rice. MRN: [id number] DOB: 1940/06/14  Situation: Referral received for Complex Care Management related to DM and weight management, I obtained verbal consent from Patient.  Visit completed with Patient  on the phone  Background:   Past Medical History:  Diagnosis Date   AICD (automatic cardioverter/defibrillator) present    Arthritis    oa   Cardiac arrest (HCC) 06/07/2014   Primary VF arrest with successful resuscitation and s/p ICD implant   Diabetes mellitus without complication (HCC)    type 2   Dyslipidemia    History of nuclear stress test 04/2012   lexiscan  - 2 day protocol; low risk study, evidence of inferrior & apical scar but no ischemia    Hypertension    Hypothyroidism    LBBB (left bundle branch block)    Morbid obesity (HCC)    NICM (nonischemic cardiomyopathy) (HCC)    Prostate enlargement    pt denies    Assessment: Patient Reported Symptoms:  Cognitive Cognitive Status: No symptoms reported, Alert and oriented to person, place, and time, Insightful and able to interpret abstract concepts, Normal speech and language skills   Health Maintenance Behaviors: Sleep adequate, Hobbies  Neurological Neurological Review of Symptoms: Numbness (Right foot,  comes and goes.)    HEENT HEENT Symptoms Reported: No symptoms reported HEENT Management Strategies: Routine screening HEENT Self-Management Outcome: 4 (good)    Cardiovascular Cardiovascular Symptoms Reported: No symptoms reported Is patient checking Blood Pressure at home?: Yes Patient's Recent BP reading at home: 107/67    Respiratory Respiratory Symptoms Reported: No symptoms reported    Endocrine Endocrine Symptoms Reported: No symptoms reported (A1C 6.0% 11/04/23) Is patient diabetic?: Yes Is patient checking blood sugars at home?: Yes List most recent blood sugar readings, include date and time of day: FBG 106mg /dL on  0/76/74 Endocrine Self-Management Outcome: 4 (good)  Gastrointestinal Gastrointestinal Symptoms Reported: No symptoms reported      Genitourinary Genitourinary Symptoms Reported: No symptoms reported Additional Genitourinary Details: He is drinking cranberry juice.    Integumentary Integumentary Symptoms Reported: No symptoms reported Skin Self-Management Outcome: 4 (good)  Musculoskeletal Musculoskelatal Symptoms Reviewed: No symptoms reported Additional Musculoskeletal Details: Continues to work out at [employer] Musculoskeletal Self-Management Outcome: 4 (good) Falls in the past year?: Yes Was there an injury with Fall?: Yes Patient at Risk for Falls Due to: History of fall(s)  Psychosocial       Quality of Family Relationships: helpful, involved, supportive Do you feel physically threatened by others?: No    12/20/2023    PHQ2-9 Depression Screening   Little interest or pleasure in doing things Not at all  Feeling down, depressed, or hopeless Not at all  PHQ-2 - Total Score 0  Trouble falling or staying asleep, or sleeping too much    Feeling tired or having little energy    Poor appetite or overeating     Feeling bad about yourself - or that you are a failure or have let yourself or your family down    Trouble concentrating on things, such as reading the newspaper or watching television    Moving or speaking so slowly that other people could have noticed.  Or the opposite - being so fidgety or restless that you have been moving around a lot more than usual    Thoughts that you would be better off dead, or hurting yourself in some way    PHQ2-9 Total Score  If you checked off any problems, how difficult have these problems made it for you to do your work, take care of things at home, or get along with other people    Depression Interventions/Treatment      There were no vitals filed for this visit.  Medications Reviewed Today     Reviewed by Lucian Santana LABOR, RN (Registered  Nurse) on 12/19/23 at 1458  Med List Status: <None>   Medication Order Taking? Sig Documenting Provider Last Dose Status Informant  aspirin  EC 81 MG tablet [id number]  Take 81 mg by mouth daily. [provider]  Active Self, Pharmacy Records  brimonidine  (ALPHAGAN ) 0.2 % ophthalmic solution [id number]  Place 1 drop into the left eye 3 (three) times daily. [provider]  Active Self, Pharmacy Records  carboxymethylcellulose (REFRESH PLUS) 0.5 % SOLN [id number]  Place 1 drop into the right eye in the morning and at bedtime. [provider]  Active Self, Pharmacy Records  carvedilol  (COREG ) 12.5 MG tablet [id number]  Take 0.5 tablets (6.25 mg total) by mouth 2 (two) times daily with a meal. [name], MD  Active Self, Pharmacy Records  CINNAMON PO [id number]  Take 1,000 mg by mouth daily. [provider]  Active Self, Pharmacy Records  DM-Doxylamine-Acetaminophen  (CORICIDIN HBP NIGHT CLD/FLU MS PO) [id number]  Take 30 mLs by mouth 3 (three) times daily.  Patient not taking: Reported on 11/30/2023   [provider]  Active   furosemide  (LASIX ) 20 MG tablet [id number]  Take 20 mg by mouth daily. [provider]  Active   glucose blood test strip [id number]  USE AS DIRECTED TO CHECK BLOOD SUGAR BID [provider]  Active Self, Pharmacy Records  JANUVIA 100 MG tablet [id number]  Take 100 mg by mouth daily. [provider]  Active Self, Pharmacy Records  levothyroxine  (SYNTHROID , LEVOTHROID) 75 MCG tablet [id number]  Take 75 mcg by mouth See admin instructions. Take 75 mcg daily on Monday through Saturday, skip Sundays [provider]  Active Self, Pharmacy Records  Magnesium  250 MG CAPS [id number]  Take 250 mg by mouth daily. [provider]  Active Self, Pharmacy Records  Multiple Vitamins-Minerals (ICAPS AREDS 2) CAPS [id number]  Take 1 capsule by mouth 2 (two) times daily. [provider]  Active Self, Pharmacy  Records  Omega-3 Fatty Acids (OMEGA 3 PO) [id number]  Take 1 capsule by mouth 2 (two) times daily. [provider]  Active Self, Pharmacy Records  rosuvastatin  (CRESTOR ) 20 MG tablet [id number]  Take 20 mg by mouth daily. [provider]  Active Self, Pharmacy Records  sacubitril -valsartan  (ENTRESTO ) 49-51 MG [id number] Yes Take 1 tablet by mouth 2 (two) times daily. Mona [name], MD  Active   spironolactone  (ALDACTONE ) 25 MG tablet [id number]  TAKE 1/2 TABLET(12.5 MG) BY MOUTH DAILY Mona [name], MD  Active   TURMERIC PO [id number]  Take 1 tablet by mouth 2 (two) times daily. [provider]  Active Self, Pharmacy Records            Recommendation:   Patient will continue taking BP and log, educated to report any symptoms such as dizziness/lightheadedness/shortness of breath/extreme fatigue.  Continue Current Plan of Care   Follow Up Plan:   Telephone follow-up two weeks  Santana Lucian BSN, CCM [place]  [facility] Population Health RN Care Manager Direct Dial: [phone]  Fax: [phone]

## 2023-12-28 ENCOUNTER — Ambulatory Visit: Payer: Self-pay | Admitting: Student in an Organized Health Care Education/Training Program

## 2023-12-31 ENCOUNTER — Ambulatory Visit: Admitting: Internal Medicine

## 2024-01-02 ENCOUNTER — Other Ambulatory Visit: Payer: Self-pay

## 2024-01-03 NOTE — Patient Instructions (Signed)
 Visit Information  Thank you for taking time to visit with me today. Please don't hesitate to contact me if I can be of assistance to you before our next scheduled appointment.  Your next care management appointment is by telephone on Thursday, November 6th at 2:30pm   Please call the care guide team at [phone] if you need to cancel, schedule, or reschedule an appointment.   A reminder to ALL patients/family/friends, please call the [place]  National Suicide Prevention Lifeline: 432-674-8899 or TTY: 4846184779 TTY 6198786597) to talk to a trained counselor if you are experiencing a Mental Health or Behavioral Health Crisis or need someone to talk to.  Santana Stamp BSN, CCM [place]  [facility] Population Health RN Care Manager Direct Dial: [phone]  Fax: [phone]

## 2024-01-03 NOTE — Patient Outreach (Signed)
 Complex Care Management   Visit Note  01/03/2024  Name:  Rickey Rice. MRN: [id number] DOB: [birth year]  Situation: Referral received for Complex Care Management related to DM, weight management. I obtained verbal consent from Patient.  Visit completed with Rickey Rice  on the phone. Continues to go to the gym for exercise, today's weight is 280lbs, he is down from 306lbs. He has not set a weight goal, taking it day by day, eating smaller portions, wife prepares good healthy meals.   Background:   Past Medical History:  Diagnosis Date   AICD (automatic cardioverter/defibrillator) present    Arthritis    oa   Cardiac arrest (HCC) 06/07/2014   Primary VF arrest with successful resuscitation and s/p ICD implant   Diabetes mellitus without complication (HCC)    type 2   Dyslipidemia    History of nuclear stress test 04/2012   lexiscan  - 2 day protocol; low risk study, evidence of inferrior & apical scar but no ischemia    Hypertension    Hypothyroidism    LBBB (left bundle branch block)    Morbid obesity (HCC)    NICM (nonischemic cardiomyopathy) (HCC)    Prostate enlargement    pt denies    Assessment: Patient Reported Symptoms:  Cognitive Cognitive Status: No symptoms reported, Alert and oriented to person, place, and time, Insightful and able to interpret abstract concepts, Normal speech and language skills Cognitive/Intellectual Conditions Management [RPT]: None reported or documented in medical history or problem list      Neurological Neurological Review of Symptoms: Numbness (Intermittent numbness in right foot, at baselline) Neurological Self-Management Outcome: 4 (good)  HEENT HEENT Symptoms Reported: Not assessed      Cardiovascular Cardiovascular Symptoms Reported: No symptoms reported (Wears compression stockings daily)    Respiratory Respiratory Symptoms Reported: No symptoms reported    Endocrine Endocrine Symptoms Reported: No symptoms reported Is  patient diabetic?: Yes Is patient checking blood sugars at home?: Yes List most recent blood sugar readings, include date and time of day: FBG 102mg /dL on 89/91/74 Endocrine Self-Management Outcome: 4 (good)  Gastrointestinal Gastrointestinal Symptoms Reported: No symptoms reported      Genitourinary Genitourinary Symptoms Reported: No symptoms reported    Integumentary Integumentary Symptoms Reported: No symptoms reported    Musculoskeletal Musculoskelatal Symptoms Reviewed: No symptoms reported        Psychosocial Psychosocial Symptoms Reported: No symptoms reported     Quality of Family Relationships: helpful, involved, supportive    01/03/2024    PHQ2-9 Depression Screening   Little interest or pleasure in doing things    Feeling down, depressed, or hopeless    PHQ-2 - Total Score    Trouble falling or staying asleep, or sleeping too much    Feeling tired or having little energy    Poor appetite or overeating     Feeling bad about yourself - or that you are a failure or have let yourself or your family down    Trouble concentrating on things, such as reading the newspaper or watching television    Moving or speaking so slowly that other people could have noticed.  Or the opposite - being so fidgety or restless that you have been moving around a lot more than usual    Thoughts that you would be better off dead, or hurting yourself in some way    PHQ2-9 Total Score    If you checked off any problems, how difficult have these problems made it for you  to do your work, take care of things at home, or get along with other people    Depression Interventions/Treatment      There were no vitals filed for this visit.  MEDICATIONS: Denies medication concerns, no problems with refills, no adverse side effects.    Recommendation:   Specialty provider follow-up Pacemaker checks as ordered; Neurology 04/22/23 Continue Current Plan of Care  Follow Up Plan:   Telephone follow-up in 1  month  Santana Stamp BSN, CCM [place]  [facility] Population Health RN Care Manager Direct Dial: [phone]  Fax: [phone]

## 2024-01-11 NOTE — Progress Notes (Signed)
 Monthly heart failure report shows overall stable HF diagnostics over the preceding 30 days with no changes to current medications needed.   Continue monthly reports with additional follow up as needed.   Ozell Jodie Passey, PA-C

## 2024-01-21 ENCOUNTER — Ambulatory Visit: Attending: Student in an Organized Health Care Education/Training Program

## 2024-01-21 DIAGNOSIS — Z95810 Presence of automatic (implantable) cardiac defibrillator: Secondary | ICD-10-CM

## 2024-01-21 DIAGNOSIS — I5022 Chronic systolic (congestive) heart failure: Secondary | ICD-10-CM

## 2024-01-21 NOTE — Progress Notes (Signed)
 Monthly heart failure report shows overall stable HF diagnostics over the preceding 30 days with no changes to current medications needed.   Continue monthly reports with additional follow up as needed.   Ozell Jodie Passey, PA-C

## 2024-01-21 NOTE — Progress Notes (Signed)
 EPIC Encounter for ICM Monitoring  Patient Name: Rickey Rice. is a 83 y.o. male Date: 01/21/2024 Primary Care Physican: Benjamine Aland, MD Primary Cardiologist: [facility] Electrophysiologist: Almetta Pore Pacing:  99%       01/29/2023 Weight: 300 lbs 02/27/2023 Office Weight: 301 lbs 06/01/2022 Office Weight: 304 lbs  08/28/2023 Weight: 292 lbs 10/10/2023 Weight: 287 lbs 11/14/2023 Weight: 284.4 lbs 01/21/2024 Weight: 278-280 lbs                                        Spoke with patient and heart failure questions reviewed.  Transmission results reviewed.  Pt asymptomatic for fluid accumulation.  Reports feeling well at this time and voices no complaints.  He was out of town this past weekend during decreased impedance.    Since 12/17/2023 ICM Remote Transmission: [facility] Thoracic impedance suggesting normal fluid levels with the exception of possible fluid accumulation starting 01/19/2024.   Prescribed:  Furosemide  20 mg take 1 tablet by mouth daily. Spironolactone  25 mg take 0.5 tablet (12.5 mg total) by mouth daily   Labs: 11/07/2023 Creatinine 1.33, BUN 20, Potassium 4.1, Sodium 143, GFR 53  11/06/2023 Creatinine 1.23, BUN 18, Potassium 3.8, Sodium 141, GFR 58  11/05/2023 Creatinine 1.50, BUN 20, Potassium 3.6, Sodium 143, GFR 46  11/04/2023 Creatinine 1.54, BUN 20, Potassium 4.0, Sodium 142, GFR 44 07/11/2023 Creatinine 1.59, BUN 27, Potassium 4.2, Sodium 146, GFR 43  06/12/2023 Creatinine 1.59, BUN 21, Potassium 5.0, Sodium 144, GFR 43  A complete set of results can be found in Results Review.   Recommendations:  No changes and encouraged to call if experiencing any fluid symptoms.   Follow-up plan: ICM clinic phone appointment on 01/29/2024 to recheck fluid levels.  91 day device clinic remote transmission 02/27/2024.       EP/Cardiology Office Visits:  Recall 05/12/2024 with Dr Mona or APP.  Recall 10/10/2024 with Dr Almetta or EP APP.   Copy of ICM check sent to Dr. Almetta.    Remote monitoring is medically necessary for Heart Failure Management.    Daily Thoracic Impedance ICM trend: 10/23/2023 through 01/21/2024.    12-14 Month Thoracic Impedance ICM trend:     Mitzie GORMAN Garner, RN 01/21/2024 8:04 AM

## 2024-01-25 ENCOUNTER — Encounter: Payer: Self-pay | Admitting: *Deleted

## 2024-01-25 NOTE — Progress Notes (Signed)
 Mandeep G Schwimmer Jr.                                          MRN: [id number]   01/25/2024   The [facility] Quality Team Specialist reviewed this patient medical record for the purposes of chart review for care gap closure. The following were reviewed: chart review for care gap closure-kidney health evaluation for diabetes:eGFR  and uACR.    [facility] Quality Team

## 2024-01-29 ENCOUNTER — Ambulatory Visit: Attending: Student in an Organized Health Care Education/Training Program

## 2024-01-29 DIAGNOSIS — Z95810 Presence of automatic (implantable) cardiac defibrillator: Secondary | ICD-10-CM

## 2024-01-29 DIAGNOSIS — I5022 Chronic systolic (congestive) heart failure: Secondary | ICD-10-CM

## 2024-01-29 NOTE — Progress Notes (Signed)
 EPIC Encounter for ICM Monitoring  Patient Name: Rickey Rice. is a 83 y.o. male Date: 01/29/2024 Primary Care Physican: Benjamine Aland, MD Primary Cardiologist: [facility] Electrophysiologist: Almetta Pore Pacing:  99%       01/29/2023 Weight: 300 lbs 02/27/2023 Office Weight: 301 lbs 06/01/2022 Office Weight: 304 lbs  08/28/2023 Weight: 292 lbs 10/10/2023 Weight: 287 lbs 11/14/2023 Weight: 284.4 lbs 01/21/2024 Weight: 278-280 lbs                                        Spoke with patient and heart failure questions reviewed.  Transmission results reviewed.  Pt asymptomatic for fluid accumulation.  Reports feeling well at this time and voices no complaints.     Since 01/21/2024 ICM Remote Transmission: [facility] Thoracic impedance suggesting fluid levels returned to normal.   Prescribed:  Furosemide  20 mg take 1 tablet by mouth daily. Spironolactone  25 mg take 0.5 tablet (12.5 mg total) by mouth daily   Labs: 11/07/2023 Creatinine 1.33, BUN 20, Potassium 4.1, Sodium 143, GFR 53  11/06/2023 Creatinine 1.23, BUN 18, Potassium 3.8, Sodium 141, GFR 58  11/05/2023 Creatinine 1.50, BUN 20, Potassium 3.6, Sodium 143, GFR 46  11/04/2023 Creatinine 1.54, BUN 20, Potassium 4.0, Sodium 142, GFR 44 07/11/2023 Creatinine 1.59, BUN 27, Potassium 4.2, Sodium 146, GFR 43  06/12/2023 Creatinine 1.59, BUN 21, Potassium 5.0, Sodium 144, GFR 43  A complete set of results can be found in Results Review.   Recommendations:  No changes and encouraged to call if experiencing any fluid symptoms.   Follow-up plan: ICM clinic phone appointment on 02/25/2024 (manual).  91 day device clinic remote transmission 02/27/2024.       EP/Cardiology Office Visits:  Recall 05/12/2024 with Dr Mona or APP.  Recall 10/10/2024 with Dr Almetta or EP APP.   Copy of ICM check sent to Dr. Almetta.   Remote monitoring is medically necessary for Heart Failure Management.    Daily Thoracic Impedance ICM trend: 10/31/2023 through  01/29/2024.    12-14 Month Thoracic Impedance ICM trend:     Mitzie GORMAN Garner, RN 01/29/2024 3:18 PM

## 2024-01-30 NOTE — Progress Notes (Signed)
 Monthly heart failure report shows elevated HF diagnostics over the prior month.   Agree with diuretic regimen per protocol as above.   Continue monthly reports with additional remote or in person follow up as needed.   Ozell Jodie Passey, PA-C

## 2024-01-31 ENCOUNTER — Other Ambulatory Visit: Payer: Self-pay

## 2024-01-31 NOTE — Patient Instructions (Signed)
 Visit Information  Thank you for taking time to visit with me today. Please don't hesitate to contact me if I can be of assistance to you before our next scheduled appointment.  Your next care management appointment is by telephone on Thursday, December 4th at 2:30pm.  Please call the care guide team at [phone] if you need to cancel, schedule, or reschedule an appointment.   A reminder to ALL patients/family/friends, please call the [place]  National Suicide Prevention Lifeline: 602 738 8154 or TTY: (915)561-5636 TTY (620)684-7920) to talk to a trained counselor if you are experiencing a Mental Health or Behavioral Health Crisis or need someone to talk to.  Santana Stamp BSN, CCM [place]  [facility] Population Health RN Care Manager Direct Dial: [phone]  Fax: [phone]

## 2024-01-31 NOTE — Patient Outreach (Addendum)
 Complex Care Management   Visit Note  01/31/2024  Name:  Rickey Rice. MRN: [id number] DOB: 1940/08/08  Situation: Referral received for Complex Care Management related to DM2, weight management. I obtained verbal consent from Patient.  Visit completed with Rickey Rice  on the phone. States he is following orders from Cardiology and PCP to work on his weight, reporting gradual weight loss from 305lbs to 277lbs today; he is achieving this by smaller portions of food, exercise at the [employer]  He is aware of his fluid restriction of 64oz a day.  Denies concerns with medications, no problems with refills, he is taking as prescribed.   Background:   Past Medical History:  Diagnosis Date   AICD (automatic cardioverter/defibrillator) present    Arthritis    oa   Cardiac arrest (HCC) 06/07/2014   Primary VF arrest with successful resuscitation and s/p ICD implant   Diabetes mellitus without complication (HCC)    type 2   Dyslipidemia    History of nuclear stress test 04/2012   lexiscan  - 2 day protocol; low risk study, evidence of inferrior & apical scar but no ischemia    Hypertension    Hypothyroidism    LBBB (left bundle branch block)    Morbid obesity (HCC)    NICM (nonischemic cardiomyopathy) (HCC)    Prostate enlargement    pt denies    Assessment: Patient Reported Symptoms:  Cognitive Cognitive Status: Alert and oriented to person, place, and time, Insightful and able to interpret abstract concepts, Normal speech and language skills, No symptoms reported Cognitive/Intellectual Conditions Management [RPT]: None reported or documented in medical history or problem list      Neurological Neurological Review of Symptoms: Not assessed    HEENT HEENT Symptoms Reported: Not assessed      Cardiovascular Cardiovascular Symptoms Reported: No symptoms reported Does patient have uncontrolled Hypertension?: No Is patient checking Blood Pressure at home?: Yes Patient's Recent BP  reading at home: Today's BP 108/67, HR 67 Cardiovascular Management Strategies: Medical device, Medication therapy Cardiovascular Self-Management Outcome: 5 (very good)  Respiratory Respiratory Symptoms Reported: No symptoms reported    Endocrine Endocrine Symptoms Reported: No symptoms reported Is patient diabetic?: Yes Is patient checking blood sugars at home?: Yes List most recent blood sugar readings, include date and time of day: FBG 118mg /dL today, FBG 108mg /dL 88/4/74, FBG 84mg /dL 88/5/74 Endocrine Self-Management Outcome: 5 (very good) Endocrine Comment: Last A1C 6.0% 11/04/23  Gastrointestinal Gastrointestinal Symptoms Reported: Not assessed      Genitourinary Genitourinary Symptoms Reported: Not assessed    Integumentary Integumentary Symptoms Reported: No symptoms reported    Musculoskeletal Musculoskelatal Symptoms Reviewed: No symptoms reported Musculoskeletal Comment: Continues to work out at [employer], spoke to a systems analyst and will meet with him today, working on muscle strength in his legs/arms to increase mobility.      Psychosocial Psychosocial Symptoms Reported: No symptoms reported          01/31/2024    PHQ2-9 Depression Screening   Little interest or pleasure in doing things    Feeling down, depressed, or hopeless    PHQ-2 - Total Score    Trouble falling or staying asleep, or sleeping too much    Feeling tired or having little energy    Poor appetite or overeating     Feeling bad about yourself - or that you are a failure or have let yourself or your family down    Trouble concentrating on things, such as reading  the newspaper or watching television    Moving or speaking so slowly that other people could have noticed.  Or the opposite - being so fidgety or restless that you have been moving around a lot more than usual    Thoughts that you would be better off dead, or hurting yourself in some way    PHQ2-9 Total Score    If you checked off any  problems, how difficult have these problems made it for you to do your work, take care of things at home, or get along with other people    Depression Interventions/Treatment      There were no vitals filed for this visit.  Medications Reviewed Today     Reviewed by Lucian Santana LABOR, RN (Registered Nurse) on 01/31/24 at 1451  Med List Status: <None>   Medication Order Taking? Sig Documenting Provider Last Dose Status Informant  aspirin  EC 81 MG tablet [id number]  Take 81 mg by mouth daily. [provider]  Active Self, Pharmacy Records  brimonidine  (ALPHAGAN ) 0.2 % ophthalmic solution [id number]  Place 1 drop into the left eye 3 (three) times daily. [provider]  Active Self, Pharmacy Records  carboxymethylcellulose (REFRESH PLUS) 0.5 % SOLN [id number]  Place 1 drop into the right eye in the morning and at bedtime. [provider]  Active Self, Pharmacy Records  carvedilol  (COREG ) 12.5 MG tablet [id number]  Take 0.5 tablets (6.25 mg total) by mouth 2 (two) times daily with a meal. [name], MD  Active Self, Pharmacy Records  CINNAMON PO [id number]  Take 1,000 mg by mouth daily. [provider]  Active Self, Pharmacy Records  DM-Doxylamine-Acetaminophen  (CORICIDIN HBP NIGHT CLD/FLU MS PO) [id number]  Take 30 mLs by mouth 3 (three) times daily.  Patient not taking: Reported on 11/30/2023   [provider]  Active   furosemide  (LASIX ) 20 MG tablet [id number] Yes Take 20 mg by mouth daily. [provider]  Active   glucose blood test strip [id number]  USE AS DIRECTED TO CHECK BLOOD SUGAR BID [provider]  Active Self, Pharmacy Records  JANUVIA 100 MG tablet [id number] Yes Take 100 mg by mouth daily. [provider]  Active Self, Pharmacy Records  levothyroxine  (SYNTHROID , LEVOTHROID) 75 MCG tablet [id number]  Take 75 mcg by mouth See admin instructions. Take 75 mcg daily on Monday through Saturday, skip Sundays [provider]  Active Self, Pharmacy Records  Magnesium  250 MG CAPS [id number]  Take 250 mg by mouth daily. [provider]  Active Self, Pharmacy Records  Multiple Vitamins-Minerals (ICAPS AREDS 2) CAPS [id number]  Take 1 capsule by mouth 2 (two) times daily. [provider]  Active Self, Pharmacy Records  Omega-3 Fatty Acids (OMEGA 3 PO) [id number]  Take 1 capsule by mouth 2 (two) times daily. [provider]  Active Self, Pharmacy Records  rosuvastatin  (CRESTOR ) 20 MG tablet [id number]  Take 20 mg by mouth daily. [provider]  Active Self, Pharmacy Records  sacubitril -valsartan  (ENTRESTO ) 49-51 MG [id number] Yes Take 1 tablet by mouth 2 (two) times daily. Mona [name], MD  Active   spironolactone  (ALDACTONE ) 25 MG tablet [id number] Yes TAKE 1/2 TABLET(12.5 MG) BY MOUTH DAILY Mona [name], MD  Active   TURMERIC PO [id number]  Take 1 tablet by mouth 2 (two) times daily. [provider]  Active Self, Pharmacy Records            Recommendation:   Discussed upcoming appts:  Specialty provider  follow-up : Neurology 04/21/2024; continues to be followed by Cardio for pacemaker checks.    Follow Up Plan:   Telephone follow-up in 1 month  Santana Stamp BSN, CCM [place]  [facility] Population Health RN Care Manager Direct Dial: [phone]  Fax: [phone]

## 2024-02-07 DIAGNOSIS — E119 Type 2 diabetes mellitus without complications: Secondary | ICD-10-CM | POA: Diagnosis not present

## 2024-02-07 DIAGNOSIS — I10 Essential (primary) hypertension: Secondary | ICD-10-CM | POA: Diagnosis not present

## 2024-02-07 DIAGNOSIS — E039 Hypothyroidism, unspecified: Secondary | ICD-10-CM | POA: Diagnosis not present

## 2024-02-07 DIAGNOSIS — N189 Chronic kidney disease, unspecified: Secondary | ICD-10-CM | POA: Diagnosis not present

## 2024-02-07 DIAGNOSIS — G309 Alzheimer's disease, unspecified: Secondary | ICD-10-CM | POA: Diagnosis not present

## 2024-02-07 DIAGNOSIS — R609 Edema, unspecified: Secondary | ICD-10-CM | POA: Diagnosis not present

## 2024-02-13 ENCOUNTER — Encounter: Payer: Self-pay | Admitting: Orthopedic Surgery

## 2024-02-13 ENCOUNTER — Ambulatory Visit: Admitting: Orthopedic Surgery

## 2024-02-13 ENCOUNTER — Other Ambulatory Visit: Payer: Self-pay

## 2024-02-13 VITALS — BP 110/70 | HR 64 | Ht 72.0 in | Wt 280.0 lb

## 2024-02-13 DIAGNOSIS — R200 Anesthesia of skin: Secondary | ICD-10-CM

## 2024-02-13 DIAGNOSIS — M2141 Flat foot [pes planus] (acquired), right foot: Secondary | ICD-10-CM | POA: Diagnosis not present

## 2024-02-13 DIAGNOSIS — M19071 Primary osteoarthritis, right ankle and foot: Secondary | ICD-10-CM

## 2024-02-13 DIAGNOSIS — M2011 Hallux valgus (acquired), right foot: Secondary | ICD-10-CM

## 2024-02-13 DIAGNOSIS — M722 Plantar fascial fibromatosis: Secondary | ICD-10-CM | POA: Diagnosis not present

## 2024-02-13 DIAGNOSIS — G609 Hereditary and idiopathic neuropathy, unspecified: Secondary | ICD-10-CM | POA: Diagnosis not present

## 2024-02-13 MED ORDER — GABAPENTIN 100 MG PO CAPS: 100.0000 mg | ORAL_CAPSULE | Freq: Three times a day (TID) | ORAL | 0 refills | Status: DC

## 2024-02-13 NOTE — Progress Notes (Signed)
  Intake history:  Chief Complaint  Patient presents with   Foot Problem    Right     Ht 6' (1.829 m)   Wt 280 lb (127 kg)   BMI 37.97 kg/m  Body mass index is 37.97 kg/m.  Pharmacy? ____[facility]__________________________________  WHAT ARE WE SEEING YOU FOR TODAY?   Right foot   How long has this bothered you? (DOI?DOS?WS?)  Clemens 2023  saw a doctor but he didn't do anything   Was there an injury? Yes  Anticoag.  No   Any ALLERGIES _____ Allergies  Allergen Reactions   Bee Venom Swelling    Swelling on lips and tongue   Demerol  [Meperidine ] Other (See Comments)    Unknown    Sulfa Antibiotics Other (See Comments)    Unknown   Pioglitazone Swelling    Actos  Lip swelling   Shellfish Protein-Containing Drug Products Rash   Shrimp Extract Rash    Only fresh shrimp   _________________________________________   Treatment:  Have you taken:  Tylenol  No  Advil  No  Had PT No  Had injection No  Other  _________________________

## 2024-02-13 NOTE — Patient Instructions (Addendum)
 Encounter Diagnoses  Name Primary?   Numbness of toes Yes   Idiopathic peripheral neuropathy    Arthritis of right midfoot    Pes planus of right foot     We would recommend the patient be started on gabapentin 100 mg 3 times a day and follow-up with his primary care doctor for peripheral neuropathy  As far as the arthritis in the midfoot the best thing is to wear foot orthotics and we would recommend that we see our foot and ankle specialist to recommend a proper orthotic   We are referring you to [facility] from [facility] address is [address] [place] The phone number is [phone]  The office will call you with an appointment Dr.  Harden

## 2024-02-13 NOTE — Progress Notes (Signed)
 Office Visit Note   Patient: Rickey Rice.           Date of Birth: May 07, 1940           MRN: [id number] Visit Date: 02/13/2024 Requested by: Benjamine Aland, MD [address] [place],  [place] [address] PCP: Benjamine Aland, MD   Assessment & Plan:   Meds ordered this encounter  Medications   gabapentin  (NEURONTIN ) 100 MG capsule    Sig: Take 1 capsule (100 mg total) by mouth 3 (three) times daily.    Dispense:  30 capsule    Refill:  0     Encounter Diagnoses  Name Primary?   Numbness of toes    Idiopathic peripheral neuropathy Yes   Arthritis of right midfoot    Pes planus of right foot     Suspect peripheral neuropathy from diabetic disease  Chronic flatfoot deformity with midfoot arthritis  This is really a medical issue with his diabetes and his peripheral neuropathy we can suggest that he start gabapentin   As far as his flatfoot deformity I recommend he see a foot and ankle specialist to recommend proper orthotic   Subjective: Chief Complaint  Patient presents with   Foot Problem    Right/ has numbness across toes     HPI: 83 year old male with history of cardiac arrest he has a defibrillator he has diabetes he has hypertension he has hypothyroidism left bundle blanch block nonischemic cardiomyopathy presents with numbness and tingling across his toes and a bandlike feeling across his midfoot which all started after he fell 2 years ago.  He did see podiatry  Assessment:  A lot of stress on his feet secondary to obesity with moderate flatfoot and mild heel pain that he does keep under control with shoe gear modifications along with thick yellow mycotic nail infections 1-5 both feet with pain      Plan:  H&P x-rays reviewed condition discussed debridement of nailbeds 1-5 both feet no iatrogenic bleeding reappoint routine care as needed and instructed on possible treatment for Planter fasciitis if it were to get worse  X-rays do indicate that there is  depression of the arch arthritis around the subtalar midtarsal joint no other pathology noted                 Plantar fasciitis  Diabetic neuropathy with neurologic complication (HCC)  Pain due to onychomycosis of toenails of both feet              ROS: Nothing contributory   Images personally read and my interpretation :    DG Foot Complete Right Result Date: 02/13/2024 Numbness in the right foot with feeling of a band around the midfoot X-rays show Plantar fascial insertion spur Talonavicular arthritis midfoot arthritis Hallux valgus     Visit Diagnoses:  1. Idiopathic peripheral neuropathy   2. Numbness of toes   3. Arthritis of right midfoot   4. Pes planus of right foot      Follow-Up Instructions: No follow-ups on file.    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  Imaging: DG Foot Complete Right Result Date: 02/13/2024 Numbness in the right foot with feeling of a band around the midfoot X-rays show Plantar fascial insertion spur Talonavicular arthritis midfoot arthritis Hallux valgus     PMFS History: Patient Active Problem List   Diagnosis Date Noted   Sepsis (HCC) 11/04/2023   Biventricular ICD (implantable cardioverter-defibrillator) in place  09/12/2020   OSA (obstructive sleep apnea) 12/19/2018   Salivary stone 02/01/2018   Sensorineural hearing loss (SNHL), bilateral 05/16/2017   VT (ventricular tachycardia) (HCC) 11/12/2015   Chronic systolic congestive heart failure/diastolic (HCC) 09/24/2015   CKD (chronic kidney disease) stage 2, GFR 60-89 ml/min 06/11/2014   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) 06/11/2014   Benign prostatic hyperplasia 06/11/2014   Obesity, morbid (HCC) 06/11/2014   Essential hypertension    NICM (nonischemic cardiomyopathy) (HCC) 06/08/2014   History of cardiac arrest    PEA (Pulseless electrical activity) (HCC) 06/07/2014   Degenerative arthritis of left knee 01/19/2014   Arthritis of left knee 01/19/2014   Stiffness of right knee 03/12/2013   Right leg weakness 03/12/2013   Osteoarthritis of right knee 02/06/2013   LBBB (left bundle branch block) 01/07/2013   DM2 (diabetes mellitus, type 2) (HCC) 01/07/2013   Hypothyroidism 01/07/2013   Morbid obesity (HCC) 01/07/2013   Dyslipidemia 01/07/2013   Past Medical History:  Diagnosis Date   AICD (automatic cardioverter/defibrillator) present    Arthritis    oa   Cardiac arrest (HCC) 06/07/2014   Primary VF arrest with successful resuscitation and s/p ICD implant   Diabetes mellitus without complication (HCC)    type 2   Dyslipidemia    History of nuclear stress test 04/2012   lexiscan  - 2 day protocol; low risk study, evidence of inferrior & apical scar but no ischemia    Hypertension    Hypothyroidism    LBBB (left bundle branch block)    Morbid obesity (HCC)    NICM (nonischemic cardiomyopathy) (HCC)    Prostate enlargement    pt denies    Family History  Problem Relation Age of Onset   Cancer Mother    Kidney disease Brother    Hypertension Sister     Past Surgical History:  Procedure Laterality Date   BI-VENTRICULAR IMPLANTABLE CARDIOVERTER DEFIBRILLATOR N/A 06/18/2014   STJ CRTD implanted by Dr Fernande   BIV ICD GENERATOR  CHANGEOUT N/A 07/17/2023   Procedure: BIV ICD GENERATOR CHANGEOUT;  Surgeon: [name], MD;  [facility] INVASIVE CV LAB;  Service: Cardiovascular;  Laterality: N/A;   COLONOSCOPY W/ BIOPSIES     COLONOSCOPY WITH PROPOFOL  N/A 04/05/2017   Procedure: COLONOSCOPY WITH PROPOFOL ;  Surgeon: Kristie Lamprey, MD;  Location: [facility] ENDOSCOPY;  Service: Endoscopy;  Laterality: N/A;   FINGER SURGERY  1954   1 st joint  right hand amputated   KNEE ARTHROSCOPY Left    LEFT HEART CATHETERIZATION WITH CORONARY ANGIOGRAM N/A 06/12/2014   Procedure: LEFT HEART CATHETERIZATION WITH CORONARY ANGIOGRAM;  Surgeon: Alm LELON Clay, MD;  Location: [facility] CATH LAB;  Service: Cardiovascular;  Laterality: N/A;  MOUTH SURGERY  1963   TOTAL KNEE ARTHROPLASTY Right 02/05/2013   Procedure: TOTAL KNEE ARTHROPLASTY;  Surgeon: Dempsey JINNY Sensor, MD;  Location: [facility] OR;  Service: Orthopedics;  Laterality: Right;   TOTAL KNEE ARTHROPLASTY Left 01/19/2014   Procedure: LEFT TOTAL KNEE ARTHROPLASTY;  Surgeon: Dempsey JINNY Sensor, MD;  Location: [facility] OR;  Service: Orthopedics;  Laterality: Left;   Social History   Occupational History   Occupation: psychiatric nurse shop  Tobacco Use   Smoking status: Never   Smokeless tobacco: Never  Vaping Use   Vaping status: Never Used  Substance and Sexual Activity   Alcohol use: Yes    Comment: rare wine use   Drug use: No   Sexual activity: Not on file

## 2024-02-25 ENCOUNTER — Ambulatory Visit: Attending: Student in an Organized Health Care Education/Training Program

## 2024-02-25 DIAGNOSIS — Z95810 Presence of automatic (implantable) cardiac defibrillator: Secondary | ICD-10-CM

## 2024-02-25 DIAGNOSIS — I5022 Chronic systolic (congestive) heart failure: Secondary | ICD-10-CM

## 2024-02-25 NOTE — Progress Notes (Signed)
 EPIC Encounter for ICM Monitoring  Patient Name: Rickey Rice. is a 83 y.o. male Date: 02/25/2024 Primary Care Physican: Benjamine Aland, MD Primary Cardiologist: [facility] Electrophysiologist: Almetta Pore Pacing:  99%       01/29/2023 Weight: 300 lbs 02/27/2023 Office Weight: 301 lbs 06/01/2022 Office Weight: 304 lbs  08/28/2023 Weight: 292 lbs 10/10/2023 Weight: 287 lbs 11/14/2023 Weight: 284.4 lbs 01/21/2024 Weight: 278-280 lbs                                        Spoke with patient and heart failure questions reviewed.  Transmission results reviewed.  Pt asymptomatic for fluid accumulation.  He is feeling well and has the start of a little fluid since Thanksgiving holiday but back on track with low salt diet today.    Since 01/29/2024 ICM Remote Transmission: [facility] Thoracic impedance suggesting normal fluid levels except possible fluid accumulation starting after Thanksgiving holiday.   Prescribed:  Furosemide  20 mg take 1 tablet by mouth daily. Spironolactone  25 mg take 0.5 tablet (12.5 mg total) by mouth daily   Labs: 11/07/2023 Creatinine 1.33, BUN 20, Potassium 4.1, Sodium 143, GFR 53  11/06/2023 Creatinine 1.23, BUN 18, Potassium 3.8, Sodium 141, GFR 58  11/05/2023 Creatinine 1.50, BUN 20, Potassium 3.6, Sodium 143, GFR 46  11/04/2023 Creatinine 1.54, BUN 20, Potassium 4.0, Sodium 142, GFR 44 07/11/2023 Creatinine 1.59, BUN 27, Potassium 4.2, Sodium 146, GFR 43  06/12/2023 Creatinine 1.59, BUN 21, Potassium 5.0, Sodium 144, GFR 43  A complete set of results can be found in Results Review.   Recommendations:  No changes and encouraged to call if experiencing any fluid symptoms.   Follow-up plan: ICM clinic phone appointment on 04/07/2024.  91 day device clinic remote transmission 05/28/2024       EP/Cardiology Office Visits:  Recall 05/12/2024 with Dr Mona or APP.  Recall 10/10/2024 with Dr Almetta or EP APP.   Copy of ICM check sent to Dr. Almetta.     Remote monitoring is  medically necessary for Heart Failure Management.    Daily Thoracic Impedance ICM trend: 11/27/2023 through 02/25/2024.    12-14 Month Thoracic Impedance ICM trend:     Mitzie GORMAN Garner, RN 02/25/2024 12:54 PM

## 2024-02-27 ENCOUNTER — Ambulatory Visit

## 2024-02-27 DIAGNOSIS — I5022 Chronic systolic (congestive) heart failure: Secondary | ICD-10-CM

## 2024-02-28 ENCOUNTER — Encounter: Payer: Self-pay | Admitting: Orthopedic Surgery

## 2024-02-28 ENCOUNTER — Ambulatory Visit: Admitting: Orthopedic Surgery

## 2024-02-28 ENCOUNTER — Other Ambulatory Visit: Payer: Self-pay

## 2024-02-28 DIAGNOSIS — I739 Peripheral vascular disease, unspecified: Secondary | ICD-10-CM

## 2024-02-28 DIAGNOSIS — M6701 Short Achilles tendon (acquired), right ankle: Secondary | ICD-10-CM | POA: Diagnosis not present

## 2024-02-28 DIAGNOSIS — M19071 Primary osteoarthritis, right ankle and foot: Secondary | ICD-10-CM

## 2024-02-28 LAB — CUP PACEART REMOTE DEVICE CHECK
Battery Remaining Longevity: 83 mo
Battery Remaining Percentage: 89 %
Battery Voltage: 2.99 V
Brady Statistic AP VP Percent: 1 %
Brady Statistic AP VS Percent: 1 %
Brady Statistic AS VP Percent: 99 %
Brady Statistic AS VS Percent: 1 %
Brady Statistic RA Percent Paced: 1 %
Date Time Interrogation Session: 20251203020350
HighPow Impedance: 59 Ohm
Implantable Lead Connection Status: 753985
Implantable Lead Connection Status: 753985
Implantable Lead Connection Status: 753985
Implantable Lead Implant Date: 20160324
Implantable Lead Implant Date: 20160324
Implantable Lead Implant Date: 20160324
Implantable Lead Location: 753858
Implantable Lead Location: 753859
Implantable Lead Location: 753860
Implantable Lead Model: 7122
Implantable Pulse Generator Implant Date: 20250422
Lead Channel Impedance Value: 400 Ohm
Lead Channel Impedance Value: 510 Ohm
Lead Channel Impedance Value: 560 Ohm
Lead Channel Pacing Threshold Amplitude: 1 V
Lead Channel Pacing Threshold Amplitude: 1 V
Lead Channel Pacing Threshold Amplitude: 1.75 V
Lead Channel Pacing Threshold Pulse Width: 0.5 ms
Lead Channel Pacing Threshold Pulse Width: 0.5 ms
Lead Channel Pacing Threshold Pulse Width: 0.9 ms
Lead Channel Sensing Intrinsic Amplitude: 12 mV
Lead Channel Sensing Intrinsic Amplitude: 2.5 mV
Lead Channel Setting Pacing Amplitude: 2 V
Lead Channel Setting Pacing Amplitude: 2.5 V
Lead Channel Setting Pacing Amplitude: 2.5 V
Lead Channel Setting Pacing Pulse Width: 0.5 ms
Lead Channel Setting Pacing Pulse Width: 0.9 ms
Lead Channel Setting Sensing Sensitivity: 0.5 mV
Pulse Gen Serial Number: 111073116
Zone Setting Status: 755011

## 2024-02-28 NOTE — Progress Notes (Signed)
 Office Visit Note   Patient: Rickey Rice.           Date of Birth: 07/18/1940           MRN: [id number] Visit Date: 02/28/2024              Requested by: Margrette Taft BRAVO, MD [address] [place],  [place] [address] PCP: Benjamine Aland, MD  Chief Complaint  Patient presents with   Right Foot - Pain      HPI: Discussed the use of AI scribe software for clinical note transcription with the patient, who gave verbal consent to proceed.  History of Present Illness Rickey Rice. is an 83 year old male with severe peripheral vascular disease and degenerative arthritis who presents with foot pain and numbness.  He experiences persistent tingling and numbness in his right foot, particularly on the side. He recalls a fall in 2024 that injured this leg, potentially contributing to his current symptoms.  He has a history of bilateral total knee arthroplasty performed in 2014-2015. For osteoarthritis, he uses Voltaren gel, applying it to the top of his foot three times a day. He also wears stiff-soled sneakers to alleviate stress on his foot.  He has had flat feet since birth and uses shoes with good arch support. He regularly engages in Achilles stretching exercises at the [facility].     Assessment & Plan: Visit Diagnoses:  1. PAD (peripheral artery disease)   2. Primary osteoarthritis of right foot   3. Contracture of right Achilles tendon     Plan: Assessment and Plan Assessment & Plan Right midfoot degenerative osteoarthritis Degenerative changes in midfoot causing pain. Surgical intervention high risk due to poor circulation from calcified arteries. - Recommended Voltaren gel (diclofenac) topically three times daily. - Advised wearing stiff soled sneakers. - Encouraged Achilles stretching exercises.  Peripheral vascular disease of right foot Severe disease with calcified arteries causing decreased circulation and numbness. - Advised wearing stiff soled  sneakers. - Recommended Achilles stretching exercises.  Right foot Achilles tendon contracture Contracture contributing to midfoot pain. Stretching exercises beneficial. - Encouraged Achilles stretching exercises, including standing on a block and dropping the heel.  Right foot pes planus Pes planus present, requiring arch support. - Recommended wearing shoes with good arch support.      Follow-Up Instructions: Return if symptoms worsen or fail to improve.   Ortho Exam  Patient is alert, oriented, no adenopathy, well-dressed, normal affect, normal respiratory effort. Physical Exam CARDIOVASCULAR: No palpable dorsalis pedis or posterior tibial pulse,with Doppler with multiphasic flow by Doppler for the dorsalis pedis and posterior tibial.  Radiographs show extensive calcification of the arteries out to the toes.  Patient has degenerative changes through the midfoot by radiographs MUSCULOSKELETAL: Achilles contraction with dorsiflexion just short of neutral with knee extended. Pes planus.      Imaging: No results found. No images are attached to the encounter.  Labs: Lab Results  Component Value Date   HGBA1C 6.0 (H) 11/04/2023   HGBA1C 6.7 (H) 06/11/2014   HGBA1C 6.5 (H) 01/19/2014   REPTSTATUS 11/09/2023 FINAL 11/04/2023   REPTSTATUS 11/09/2023 FINAL 11/04/2023   CULT  11/04/2023    NO GROWTH 5 DAYS Performed at [facility], [address] [place], [place] [address]    CULT  11/04/2023    NO GROWTH 5 DAYS Performed at [facility], [address], [place], [place] [address]    [facility] SERRATIA MARCESCENS (A) 11/04/2023  Lab Results  Component Value Date   ALBUMIN  2.9 (L) 11/07/2023   ALBUMIN  2.7 (L) 11/06/2023   ALBUMIN  2.8 (L) 11/05/2023    Lab Results  Component Value Date   MG 2.2 11/04/2023   MG 2.2 01/31/2023   MG 1.9 02/02/2016   No results found for: VD25OH  No results found for: PREALBUMIN    Latest Ref Rng & Units 11/06/2023    4:12  AM 11/05/2023    4:48 AM 11/04/2023    2:15 PM  CBC EXTENDED  WBC 4.0 - 10.5 K/uL 5.6  10.9  11.1   RBC 4.22 - 5.81 MIL/uL 3.15  3.02  3.54   Hemoglobin 13.0 - 17.0 g/dL 89.8  9.8  88.3   HCT 60.9 - 52.0 % 32.4  31.0  36.1   Platelets 150 - 400 K/uL 117  126  146   NEUT# 1.7 - 7.7 K/uL 4.1  8.0  9.0   Lymph# 0.7 - 4.0 K/uL 0.7  1.2  0.7      There is no height or weight on file to calculate BMI.  Orders:  No orders of the defined types were placed in this encounter.  No orders of the defined types were placed in this encounter.    Procedures: No procedures performed  Clinical Data: No additional findings.  ROS:  All other systems negative, except as noted in the HPI. Review of Systems  Objective: Vital Signs: There were no vitals taken for this visit.  Specialty Comments:  No specialty comments available.  PMFS History: Patient Active Problem List   Diagnosis Date Noted   Sepsis (HCC) 11/04/2023   Biventricular ICD (implantable cardioverter-defibrillator) in place 09/12/2020   OSA (obstructive sleep apnea) 12/19/2018   Salivary stone 02/01/2018   Sensorineural hearing loss (SNHL), bilateral 05/16/2017   VT (ventricular tachycardia) (HCC) 11/12/2015   Chronic systolic congestive heart failure/diastolic (HCC) 09/24/2015   CKD (chronic kidney disease) stage 2, GFR 60-89 ml/min 06/11/2014   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) 06/11/2014   Benign prostatic hyperplasia 06/11/2014   Obesity, morbid (HCC) 06/11/2014   Essential hypertension    NICM (nonischemic cardiomyopathy) (HCC) 06/08/2014   History of cardiac arrest    PEA (Pulseless electrical activity) (HCC) 06/07/2014   Degenerative arthritis of left knee 01/19/2014   Arthritis of left knee 01/19/2014   Stiffness of right knee 03/12/2013   Right leg weakness 03/12/2013   Osteoarthritis of right knee 02/06/2013   LBBB (left bundle branch block) 01/07/2013   DM2 (diabetes mellitus, type 2) (HCC) 01/07/2013    Hypothyroidism 01/07/2013   Morbid obesity (HCC) 01/07/2013   Dyslipidemia 01/07/2013   Past Medical History:  Diagnosis Date   AICD (automatic cardioverter/defibrillator) present    Arthritis    oa   Cardiac arrest (HCC) 06/07/2014   Primary VF arrest with successful resuscitation and s/p ICD implant   Diabetes mellitus without complication (HCC)    type 2   Dyslipidemia    History of nuclear stress test 04/2012   lexiscan  - 2 day protocol; low risk study, evidence of inferrior & apical scar but no ischemia    Hypertension    Hypothyroidism    LBBB (left bundle branch block)    Morbid obesity (HCC)    NICM (nonischemic cardiomyopathy) (HCC)    Prostate enlargement    pt denies    Family History  Problem Relation Age of Onset   Cancer Mother    Kidney disease Brother  Hypertension Sister     Past Surgical History:  Procedure Laterality Date   BI-VENTRICULAR IMPLANTABLE CARDIOVERTER DEFIBRILLATOR N/A 06/18/2014   STJ CRTD implanted by Dr Fernande   BIV ICD GENERATOR CHANGEOUT N/A 07/17/2023   Procedure: BIV ICD GENERATOR CHANGEOUT;  Surgeon: [name], MD;  Location: [facility] INVASIVE CV LAB;  Service: Cardiovascular;  Laterality: N/A;   COLONOSCOPY W/ BIOPSIES     COLONOSCOPY WITH PROPOFOL  N/A 04/05/2017   Procedure: COLONOSCOPY WITH PROPOFOL ;  Surgeon: Kristie Lamprey, MD;  Location: [facility] ENDOSCOPY;  Service: Endoscopy;  Laterality: N/A;   FINGER SURGERY  1954   1 st joint  right hand amputated   KNEE ARTHROSCOPY Left    LEFT HEART CATHETERIZATION WITH CORONARY ANGIOGRAM N/A 06/12/2014   Procedure: LEFT HEART CATHETERIZATION WITH CORONARY ANGIOGRAM;  Surgeon: Alm LELON Clay, MD;  Location: [facility] CATH LAB;  Service: Cardiovascular;  Laterality: N/A;   MOUTH SURGERY  1963   TOTAL KNEE ARTHROPLASTY Right 02/05/2013   Procedure: TOTAL KNEE ARTHROPLASTY;  Surgeon: Dempsey JINNY Sensor, MD;  Location: [facility] OR;  Service: Orthopedics;  Laterality: Right;   TOTAL KNEE ARTHROPLASTY Left 01/19/2014    Procedure: LEFT TOTAL KNEE ARTHROPLASTY;  Surgeon: Dempsey JINNY Sensor, MD;  Location: [facility] OR;  Service: Orthopedics;  Laterality: Left;   Social History   Occupational History   Occupation: psychiatric nurse shop  Tobacco Use   Smoking status: Never   Smokeless tobacco: Never  Vaping Use   Vaping status: Never Used  Substance and Sexual Activity   Alcohol use: Yes    Comment: rare wine use   Drug use: No   Sexual activity: Not on file

## 2024-02-28 NOTE — Patient Instructions (Signed)
 Visit Information  Thank you for taking time to visit with me today. Please don't hesitate to contact me if I can be of assistance to you before our next scheduled appointment.  Your next care management appointment is by telephone on Thursday, December 18th at `1:30pm   Please call the care guide team at [phone] if you need to cancel, schedule, or reschedule an appointment.   Please call the [place]  National Suicide Prevention Lifeline: 902-520-5445 or TTY: 4400589112 TTY [phone]) to talk to a trained counselor if you are experiencing a Mental Health or Behavioral Health Crisis or need someone to talk to.  Santana Stamp BSN, CCM [place]  [facility] Population Health RN Care Manager Direct Dial: [phone]  Fax: [phone]

## 2024-02-28 NOTE — Patient Outreach (Signed)
 Complex Care Management   Visit Note  02/28/2024  Name:  Rickey Rice. MRN: [id number] DOB: [birth year]  Situation: Referral received for Complex Care Management related to DM2, weight management. I obtained verbal consent from Patient.  Visit completed with Mr. Murch  on the phone. Denies concerns today, attended Ortho appt. 02/13/24 where MD ordered gabapentin  for toe numbness.   Background:   Past Medical History:  Diagnosis Date   AICD (automatic cardioverter/defibrillator) present    Arthritis    oa   Cardiac arrest (HCC) 06/07/2014   Primary VF arrest with successful resuscitation and s/p ICD implant   Diabetes mellitus without complication (HCC)    type 2   Dyslipidemia    History of nuclear stress test 04/2012   lexiscan  - 2 day protocol; low risk study, evidence of inferrior & apical scar but no ischemia    Hypertension    Hypothyroidism    LBBB (left bundle branch block)    Morbid obesity (HCC)    NICM (nonischemic cardiomyopathy) (HCC)    Prostate enlargement    pt denies    Assessment: Patient Reported Symptoms:  Cognitive Cognitive Status: Alert and oriented to person, place, and time, Insightful and able to interpret abstract concepts, Normal speech and language skills      Neurological Neurological Review of Symptoms: Other:    HEENT HEENT Symptoms Reported: Not assessed      Cardiovascular Cardiovascular Symptoms Reported: No symptoms reported Does patient have uncontrolled Hypertension?: No    Respiratory Respiratory Symptoms Reported: No symptoms reported    Endocrine Endocrine Symptoms Reported: Not assessed    Gastrointestinal Gastrointestinal Symptoms Reported: No symptoms reported      Genitourinary Genitourinary Symptoms Reported: No symptoms reported    Integumentary Integumentary Symptoms Reported: No symptoms reported    Musculoskeletal Musculoskelatal Symptoms Reviewed: No symptoms reported        Psychosocial Psychosocial  Symptoms Reported: Not assessed          02/28/2024    PHQ2-9 Depression Screening   Little interest or pleasure in doing things    Feeling down, depressed, or hopeless    PHQ-2 - Total Score    Trouble falling or staying asleep, or sleeping too much    Feeling tired or having little energy    Poor appetite or overeating     Feeling bad about yourself - or that you are a failure or have let yourself or your family down    Trouble concentrating on things, such as reading the newspaper or watching television    Moving or speaking so slowly that other people could have noticed.  Or the opposite - being so fidgety or restless that you have been moving around a lot more than usual    Thoughts that you would be better off dead, or hurting yourself in some way    PHQ2-9 Total Score    If you checked off any problems, how difficult have these problems made it for you to do your work, take care of things at home, or get along with other people    Depression Interventions/Treatment      There were no vitals filed for this visit.    MEDICATIONS: No med concerns, no issues with getting refills, taking as prescribed.    Recommendation:    Continue Current Plan of Care  Follow Up Plan:   Telephone follow-up in 1 month  Naval Architect, CCM [place]  VBCI [employer] Health RN Care Manager  Direct Dial: [phone]  Fax: [phone]

## 2024-03-04 NOTE — Progress Notes (Signed)
 Remote ICD Transmission

## 2024-03-13 ENCOUNTER — Other Ambulatory Visit: Payer: Self-pay

## 2024-03-13 DIAGNOSIS — G609 Hereditary and idiopathic neuropathy, unspecified: Secondary | ICD-10-CM

## 2024-03-13 NOTE — Patient Instructions (Signed)
 Visit Information  Thank you for taking time to visit with me today. Please don't hesitate to contact me if I can be of assistance to you before our next scheduled appointment.  Your next care management appointment is by telephone on 04/03/2024 at 1330   Please call the care guide team at [phone] if you need to cancel, schedule, or reschedule an appointment.   Please call the [place]  National Suicide Prevention Lifeline: (343) 726-5576 or TTY: [phone] TTY [phone]) to talk to a trained counselor if you are experiencing a Mental Health or Behavioral Health Crisis or need someone to talk to.  Santana Stamp BSN, CCM [place]  [facility] Population Health RN Care Manager Direct Dial: [phone]  Fax: [phone]

## 2024-03-13 NOTE — Patient Outreach (Signed)
 Complex Care Management   Visit Note  03/13/2024  Name:  Rickey Rice. MRN: [id number] DOB: 06-24-1940  Situation: Referral received for Complex Care Management related to Diabetes with Complications and weight management I obtained verbal consent from Patient.  Visit completed with Rickey Rice  on the phone  Background:   Past Medical History:  Diagnosis Date   AICD (automatic cardioverter/defibrillator) present    Arthritis    oa   Cardiac arrest (HCC) 06/07/2014   Primary VF arrest with successful resuscitation and s/p ICD implant   Diabetes mellitus without complication (HCC)    type 2   Dyslipidemia    History of nuclear stress test 04/2012   lexiscan  - 2 day protocol; low risk study, evidence of inferrior & apical scar but no ischemia    Hypertension    Hypothyroidism    LBBB (left bundle branch block)    Morbid obesity (HCC)    NICM (nonischemic cardiomyopathy) (HCC)    Prostate enlargement    pt denies    Assessment: Patient Reported Symptoms:  Cognitive Cognitive Status: Insightful and able to interpret abstract concepts, No symptoms reported, Alert and oriented to person, place, and time      Neurological Neurological Review of Symptoms: Numbness Neurological Comment: No change in Toe numbness. Has decided not to take Gabapentin   HEENT HEENT Symptoms Reported: Not assessed      Cardiovascular Cardiovascular Symptoms Reported: No symptoms reported Does patient have uncontrolled Hypertension?: No Is patient checking Blood Pressure at home?: Yes Patient's Recent BP reading at home: Member was not home for acurate last reading but states it was WNL Cardiovascular Management Strategies: Weight management Do You Have a Working Readable Scale?: Yes Weight: 275 lb (124.7 kg) Cardiovascular Self-Management Outcome: 5 (very good)  Respiratory Respiratory Symptoms Reported: No symptoms reported    Endocrine Endocrine Symptoms Reported: No symptoms  reported Is patient diabetic?: Yes Is patient checking blood sugars at home?: Yes List most recent blood sugar readings, include date and time of day: 134mg /dL- pt admits to eating sweets last evening which elevated this mornings BGM Endocrine Self-Management Outcome: 5 (very good)  Gastrointestinal Gastrointestinal Symptoms Reported: Not assessed      Genitourinary Genitourinary Symptoms Reported: Not assessed    Integumentary Integumentary Symptoms Reported: Not assessed    Musculoskeletal Musculoskelatal Symptoms Reviewed: No symptoms reported Additional Musculoskeletal Details: Continues to work out at [facility] routinely        Psychosocial Psychosocial Symptoms Reported: Other Other Psychosocial Conditions: Expressed multiple recent losses of family and friends. Currently helping in care of his 2 older sisters who are in facilities. Increased stress around holiday activities. States he will be glad when holidays are over so he can just chill and get back to his normal routine. Additional Psychological Details: Educated on resources available for care giver stress and greif and loss with LCSW. Offered referral but member declined. Explained if he changes his mind he can call back or discuss further at next call and pt agreed. Behavioral Management Strategies: Exercise, Activity Behavioral Health Self-Management Outcome: 3 (uncertain) Behavioral Health Comment: Pt is active and excercises daily which he admits he can run out his stress on the treadmill        03/13/2024    PHQ2-9 Depression Screening   Little interest or pleasure in doing things    Feeling down, depressed, or hopeless    PHQ-2 - Total Score    Trouble falling or staying asleep, or sleeping too much  Feeling tired or having little energy    Poor appetite or overeating     Feeling bad about yourself - or that you are a failure or have let yourself or your family down    Trouble concentrating on things, such as  reading the newspaper or watching television    Moving or speaking so slowly that other people could have noticed.  Or the opposite - being so fidgety or restless that you have been moving around a lot more than usual    Thoughts that you would be better off dead, or hurting yourself in some way    PHQ2-9 Total Score    If you checked off any problems, how difficult have these problems made it for you to do your work, take care of things at home, or get along with other people    Depression Interventions/Treatment      Today's Vitals   03/13/24 1415  Weight: 275 lb (124.7 kg)   Pain Scale: 0-10 Pain Score: 0-No pain  Medications: No additions or changes other than pt states he is not taking Gabapentin . No issues with refills.   Recommendation:   Specialty provider follow-up 04/07/2024 with Cardiology and 04/21/2024 with Neurology Continue Current Plan of Care  Follow Up Plan:   Telephone follow-up in 1 month  Santana Stamp BSN, CCM [place]  [facility] Population Health RN Care Manager Direct Dial: [phone]  Fax: [phone]

## 2024-03-28 ENCOUNTER — Other Ambulatory Visit: Payer: Self-pay

## 2024-03-28 ENCOUNTER — Other Ambulatory Visit: Payer: Self-pay | Admitting: Internal Medicine

## 2024-03-28 MED ORDER — FUROSEMIDE 20 MG PO TABS: 20.0000 mg | ORAL_TABLET | Freq: Every day | ORAL | 0 refills | Status: AC

## 2024-04-01 ENCOUNTER — Other Ambulatory Visit: Payer: Self-pay | Admitting: Orthopedic Surgery

## 2024-04-01 DIAGNOSIS — G609 Hereditary and idiopathic neuropathy, unspecified: Secondary | ICD-10-CM

## 2024-04-03 ENCOUNTER — Other Ambulatory Visit: Payer: Self-pay

## 2024-04-04 NOTE — Patient Outreach (Signed)
 Complex Care Management   Visit Note  04/04/2024  Name:  Rickey Rice. MRN: [id number] DOB: [birth year]  Situation: Referral received for Complex Care Management related to DM2, Weight Management.  I obtained verbal consent from Patient.  Visit completed with Rickey Rice  on the phone  Background:   Past Medical History:  Diagnosis Date   AICD (automatic cardioverter/defibrillator) present    Arthritis    oa   Cardiac arrest (HCC) 06/07/2014   Primary VF arrest with successful resuscitation and s/p ICD implant   Diabetes mellitus without complication (HCC)    type 2   Dyslipidemia    History of nuclear stress test 04/2012   lexiscan  - 2 day protocol; low risk study, evidence of inferrior & apical scar but no ischemia    Hypertension    Hypothyroidism    LBBB (left bundle branch block)    Morbid obesity (HCC)    NICM (nonischemic cardiomyopathy) (HCC)    Prostate enlargement    pt denies    Assessment: Patient Reported Symptoms:  Cognitive Cognitive Status: Alert and oriented to person, place, and time, Insightful and able to interpret abstract concepts, Normal speech and language skills      Neurological Neurological Review of Symptoms: Numbness Neurological Comment: No change in Toe numbness. Has decided not to take Gabapentin , this RNCM updated med list to reflect.  HEENT HEENT Symptoms Reported: Not assessed      Cardiovascular Cardiovascular Symptoms Reported: No symptoms reported Patient's Recent BP reading at home: Wears compression stockings daily.    Respiratory Respiratory Symptoms Reported: No symptoms reported    Endocrine Endocrine Symptoms Reported: No symptoms reported Is patient diabetic?: Yes Is patient checking blood sugars at home?: Yes List most recent blood sugar readings, include date and time of day: Today's fasting blood glucose: 117mg /dL.    Gastrointestinal Gastrointestinal Symptoms Reported: No symptoms reported      Genitourinary  Genitourinary Symptoms Reported: No symptoms reported    Integumentary Integumentary Symptoms Reported: No symptoms reported    Musculoskeletal Musculoskelatal Symptoms Reviewed: No symptoms reported Additional Musculoskeletal Details: Will get back to working out at the [facility] soon, stopped due to holidays and family visits.        Psychosocial Psychosocial Symptoms Reported: No symptoms reported          04/04/2024    PHQ2-9 Depression Screening   Little interest or pleasure in doing things    Feeling down, depressed, or hopeless    PHQ-2 - Total Score    Trouble falling or staying asleep, or sleeping too much    Feeling tired or having little energy    Poor appetite or overeating     Feeling bad about yourself - or that you are a failure or have let yourself or your family down    Trouble concentrating on things, such as reading the newspaper or watching television    Moving or speaking so slowly that other people could have noticed.  Or the opposite - being so fidgety or restless that you have been moving around a lot more than usual    Thoughts that you would be better off dead, or hurting yourself in some way    PHQ2-9 Total Score    If you checked off any problems, how difficult have these problems made it for you to do your work, take care of things at home, or get along with other people    Depression Interventions/Treatment      There  were no vitals filed for this visit. Pain Scale: 0-10 Pain Score: 0-No pain  MEDICATIONS: Patient denies concerns with meds, no issues with refills.    Recommendation:   Specialty provider follow-up Cardiology on 04/07/24; Neurology on  04/21/24 Continue Current Plan of Care  Follow Up Plan:   One month   Santana Stamp BSN, CCM [place]  [facility] Population Health RN Care Manager Direct Dial: [phone]  Fax: [phone]

## 2024-04-04 NOTE — Patient Instructions (Signed)
 Visit Information  Thank you for taking time to visit with me today. Please don't hesitate to contact me if I can be of assistance to you before our next scheduled appointment.  Your next care management appointment is by telephone on Thursday, February 5th at 1:30pm.    Please call the care guide team at [phone] if you need to cancel, schedule, or reschedule an appointment.   A reminder to ALL patients/family/friends, please call the [place]  National Suicide Prevention Lifeline: 9010821972 or TTY: 479 164 7399 TTY [phone]) to talk to a trained counselor if you are experiencing a Mental Health or Behavioral Health Crisis or need someone to talk to.  Santana Stamp BSN, CCM [place]  VBCI Population Health RN Care Manager Direct Dial: [phone]  Fax: [phone]

## 2024-04-07 ENCOUNTER — Ambulatory Visit: Attending: Student in an Organized Health Care Education/Training Program

## 2024-04-07 DIAGNOSIS — I5022 Chronic systolic (congestive) heart failure: Secondary | ICD-10-CM

## 2024-04-07 DIAGNOSIS — Z95810 Presence of automatic (implantable) cardiac defibrillator: Secondary | ICD-10-CM

## 2024-04-08 NOTE — Progress Notes (Signed)
 EPIC Encounter for ICM Monitoring  Patient Name: Rickey Rice. is a 84 y.o. male Date: 04/08/2024 Primary Care Physican: Benjamine Aland, MD Primary Cardiologist: [facility] Electrophysiologist: Almetta Pore Pacing:  99%       01/29/2023 Weight: 300 lbs 02/27/2023 Office Weight: 301 lbs 06/01/2022 Office Weight: 304 lbs  08/28/2023 Weight: 292 lbs 10/10/2023 Weight: 287 lbs 11/14/2023 Weight: 284.4 lbs 01/21/2024 Weight: 278-280 lbs 04/08/2024 Weight: 274.6 lbs                                        Spoke with patient and heart failure questions reviewed.  Transmission results reviewed.  Pt asymptomatic for fluid accumulation.  He is doing well.   Has arthritis and poor circulation in right foot.  He continues to exercise at [facility].     Since 02/25/2024 ICM Remote Transmission: [facility] Thoracic impedance suggesting normal fluid levels.   Prescribed:  Furosemide  20 mg take 1 tablet by mouth daily. Spironolactone  25 mg take 0.5 tablet (12.5 mg total) by mouth daily   Labs: 11/07/2023 Creatinine 1.33, BUN 20, Potassium 4.1, Sodium 143, GFR 53  11/06/2023 Creatinine 1.23, BUN 18, Potassium 3.8, Sodium 141, GFR 58  11/05/2023 Creatinine 1.50, BUN 20, Potassium 3.6, Sodium 143, GFR 46  11/04/2023 Creatinine 1.54, BUN 20, Potassium 4.0, Sodium 142, GFR 44 07/11/2023 Creatinine 1.59, BUN 27, Potassium 4.2, Sodium 146, GFR 43  06/12/2023 Creatinine 1.59, BUN 21, Potassium 5.0, Sodium 144, GFR 43  A complete set of results can be found in Results Review.   Recommendations:  No changes and encouraged to call if experiencing any fluid symptoms.   Follow-up plan: ICM clinic phone appointment on 05/08/2024.  91 day device clinic remote transmission 05/28/2024.   EP/Cardiology Office Visits:  Recall 05/12/2024 with Dr Mona or APP.  Recall 10/10/2024 with Dr Almetta or EP APP.   Copy of ICM check sent to Dr. Almetta.     Remote monitoring is medically necessary for Heart Failure Management.    Daily  Thoracic Impedance ICM trend: 01/08/2024 through 04/07/2024.    12-14 Month Thoracic Impedance ICM trend:     Mitzie GORMAN Garner, RN 04/08/2024 10:06 AM

## 2024-04-21 ENCOUNTER — Ambulatory Visit: Admitting: Diagnostic Neuroimaging

## 2024-04-24 NOTE — Progress Notes (Signed)
 31 day ICM Remote transmission canceled due to [facility] clinic is on hold until further notice.  91 day remote monitoring will continue per protocol.

## 2024-05-01 ENCOUNTER — Other Ambulatory Visit: Payer: Self-pay

## 2024-05-02 NOTE — Patient Instructions (Signed)
 Visit Information  Thank you for taking time to visit with me today. Please don't hesitate to contact me if I can be of assistance to you before our next scheduled appointment.  Your next care management appointment is by telephone on Thursday, March 5th at 1:30pm.  Please call the care guide team at [phone] if you need to cancel, schedule, or reschedule an appointment.   Please call the [place]  National Suicide Prevention Lifeline: 267-257-2031 or TTY: (562)562-8131 TTY (434)281-2029) to talk to a trained counselor if you are experiencing a Mental Health or Behavioral Health Crisis or need someone to talk to.  Santana Stamp BSN, CCM [place]  VBCI Population Health RN Care Manager Direct Dial: [phone]  Fax: [phone]

## 2024-05-02 NOTE — Patient Outreach (Signed)
 Complex Care Management   Visit Note  05/02/2024  Name:  Rickey Rice. MRN: [id number] DOB: [birth year]  Situation: Referral received for Complex Care Management related to DM2, weight management. I obtained verbal consent from Patient.  Visit completed with Rickey Rice  on the phone  Background:   Past Medical History:  Diagnosis Date   AICD (automatic cardioverter/defibrillator) present    Arthritis    oa   Cardiac arrest (HCC) 06/07/2014   Primary VF arrest with successful resuscitation and s/p ICD implant   Diabetes mellitus without complication (HCC)    type 2   Dyslipidemia    History of nuclear stress test 04/2012   lexiscan  - 2 day protocol; low risk study, evidence of inferrior & apical scar but no ischemia    Hypertension    Hypothyroidism    LBBB (left bundle branch block)    Morbid obesity (HCC)    NICM (nonischemic cardiomyopathy) (HCC)    Prostate enlargement    pt denies    Assessment: Patient Reported Symptoms:  Cognitive Cognitive Status: Alert and oriented to person, place, and time, Insightful and able to interpret abstract concepts, No symptoms reported, Normal speech and language skills      Neurological Neurological Review of Symptoms: Numbness Neurological Comment: Numbness in toe at baseline.  HEENT HEENT Symptoms Reported: Not assessed      Cardiovascular Cardiovascular Symptoms Reported: No symptoms reported Is patient checking Blood Pressure at home?: Yes Patient's Recent BP reading at home: 102/67 Do You Have a Working Readable Scale?: Yes Weight: 270 lb 1.6 oz (122.5 kg) Cardiovascular Self-Management Outcome: 5 (very good) Cardiovascular Comment: Patient has defibillrator in place. Follows 64oz/day fluid restriction.  Aware of symptoms that would warrent call to Cardiology office.  Respiratory Respiratory Symptoms Reported: No symptoms reported    Endocrine Endocrine Symptoms Reported: No symptoms reported Is patient diabetic?:  Yes Is patient checking blood sugars at home?: Yes List most recent blood sugar readings, include date and time of day: Today's FBG: 115mg /dL Endocrine Self-Management Outcome: 5 (very good)  Gastrointestinal Gastrointestinal Symptoms Reported: Not assessed      Genitourinary Genitourinary Symptoms Reported: Not assessed    Integumentary Integumentary Symptoms Reported: Not assessed    Musculoskeletal Musculoskelatal Symptoms Reviewed: No symptoms reported Musculoskeletal Comment: Rickey Rice is taking great care while being outdoors due to snow/ice.      Psychosocial Psychosocial Symptoms Reported: Not assessed          05/02/2024    PHQ2-9 Depression Screening   Little interest or pleasure in doing things    Feeling down, depressed, or hopeless    PHQ-2 - Total Score    Trouble falling or staying asleep, or sleeping too much    Feeling tired or having little energy    Poor appetite or overeating     Feeling bad about yourself - or that you are a failure or have let yourself or your family down    Trouble concentrating on things, such as reading the newspaper or watching television    Moving or speaking so slowly that other people could have noticed.  Or the opposite - being so fidgety or restless that you have been moving around a lot more than usual    Thoughts that you would be better off dead, or hurting yourself in some way    PHQ2-9 Total Score    If you checked off any problems, how difficult have these problems made it for you to do your  work, take care of things at home, or get along with other people    Depression Interventions/Treatment      Today's Vitals   05/01/24 1353  Weight: 270 lb 1.6 oz (122.5 kg)      Medications Reviewed Today     Reviewed by Lucian Santana LABOR, RN (Registered Nurse) on 05/02/24 at 1139  Med List Status: <None>   Medication Order Taking? Sig Documenting Provider Last Dose Status Informant  aspirin  EC 81 MG tablet [id number] Yes Take 81 mg by  mouth daily. [provider]  Active Self, Pharmacy Records  brimonidine  (ALPHAGAN ) 0.2 % ophthalmic solution [id number] Yes Place 1 drop into the left eye 3 (three) times daily. [provider]  Active Self, Pharmacy Records  carboxymethylcellulose (REFRESH PLUS) 0.5 % SOLN [id number] Yes Place 1 drop into the right eye in the morning and at bedtime. [provider]  Active Self, Pharmacy Records  carvedilol  (COREG ) 12.5 MG tablet [id number] Yes Take 0.5 tablets (6.25 mg total) by mouth 2 (two) times daily with a meal. [name], MD  Active Self, Pharmacy Records  CINNAMON PO [id number] Yes Take 1,000 mg by mouth daily. [provider]  Active Self, Pharmacy Records  DM-Doxylamine-Acetaminophen  (CORICIDIN HBP NIGHT CLD/FLU MS PO) [id number]  Take 30 mLs by mouth 3 (three) times daily.  Patient not taking: Reported on 05/02/2024   [provider]  Active   FARXIGA  10 MG TABS tablet [id number]   [provider]  Active   furosemide  (LASIX ) 20 MG tablet [id number]  Take 1 tablet (20 mg total) by mouth daily.  Patient not taking: Reported on 05/02/2024   Mona [name], MD  Active   gabapentin  (NEURONTIN ) 100 MG capsule [id number]  TAKE 1 CAPSULE(100 MG) BY MOUTH THREE TIMES DAILY  Patient not taking: Reported on 05/02/2024   Margrette Taft BRAVO, MD  Active   glucose blood test strip [id number] Yes USE AS DIRECTED TO CHECK BLOOD SUGAR BID [provider]  Active Self, Pharmacy Records  JANUVIA 100 MG tablet [id number] Yes Take 100 mg by mouth daily. [provider]  Active Self, Pharmacy Records  levothyroxine  (SYNTHROID , LEVOTHROID) 75 MCG tablet [id number] Yes Take 75 mcg by mouth See admin instructions. Take 75 mcg daily on Monday through Saturday, skip Sundays [provider]  Active Self, Pharmacy Records  Magnesium  250 MG CAPS [id number] Yes Take 250 mg by mouth daily. [provider]  Active Self, Pharmacy Records   Multiple Vitamins-Minerals (ICAPS AREDS 2) CAPS [id number] Yes Take 1 capsule by mouth 2 (two) times daily. [provider]  Active Self, Pharmacy Records  Omega-3 Fatty Acids (OMEGA 3 PO) [id number] Yes Take 1 capsule by mouth 2 (two) times daily. [provider]  Active Self, Pharmacy Records  rosuvastatin  (CRESTOR ) 20 MG tablet [id number] Yes Take 20 mg by mouth daily. [provider]  Active Self, Pharmacy Records  sacubitril -valsartan  (ENTRESTO ) 49-51 MG [id number] Yes Take 1 tablet by mouth 2 (two) times daily. Mona [name], MD  Active   spironolactone  (ALDACTONE ) 25 MG tablet [id number] Yes TAKE 1/2 TABLET(12.5 MG) BY MOUTH DAILY Mona [name], MD  Active   TURMERIC PO [id number] Yes Take 1 tablet by mouth 2 (two) times daily. [provider]  Active Self, Pharmacy Records            Recommendation:   Continue Current Plan of Care  Follow Up Plan:   Telephone follow-up in 1 month  [facility]  BSN, CCM [place]  VBCI Population Health RN Care Manager Direct Dial: [phone]  Fax: [phone]

## 2024-05-08 ENCOUNTER — Ambulatory Visit: Payer: Self-pay

## 2024-05-28 ENCOUNTER — Encounter

## 2024-05-29 ENCOUNTER — Telehealth: Payer: Self-pay

## 2024-07-01 ENCOUNTER — Ambulatory Visit: Admitting: Neurology

## 2024-08-27 ENCOUNTER — Encounter

## 2024-11-26 ENCOUNTER — Encounter

## 2025-02-25 ENCOUNTER — Encounter

## 2025-05-27 ENCOUNTER — Encounter
